# Patient Record
Sex: Female | Born: 1980 | Race: White | Hispanic: No | Marital: Married | Smoking: Current every day smoker
Health system: Southern US, Community
[De-identification: ages and names within clinical notes are randomized; demographics above are authoritative.]

## PROBLEM LIST (undated history)

## (undated) DIAGNOSIS — F329 Major depressive disorder, single episode, unspecified: Secondary | ICD-10-CM

## (undated) DIAGNOSIS — C801 Malignant (primary) neoplasm, unspecified: Secondary | ICD-10-CM

## (undated) DIAGNOSIS — F32A Depression, unspecified: Secondary | ICD-10-CM

## (undated) DIAGNOSIS — G709 Myoneural disorder, unspecified: Secondary | ICD-10-CM

## (undated) DIAGNOSIS — Z720 Tobacco use: Secondary | ICD-10-CM

## (undated) DIAGNOSIS — F320 Major depressive disorder, single episode, mild: Secondary | ICD-10-CM

## (undated) HISTORY — DX: Depression, unspecified: F32.A

## (undated) HISTORY — DX: Tobacco use: Z72.0

## (undated) HISTORY — DX: Major depressive disorder, single episode, unspecified: F32.9

## (undated) HISTORY — DX: Major depressive disorder, single episode, mild: F32.0

## (undated) MED FILL — Fluorouracil IV Soln 2.5 GM/50ML (50 MG/ML): INTRAVENOUS | Qty: 15 | Status: AC

## (undated) MED FILL — Fluorouracil IV Soln 5 GM/100ML (50 MG/ML): INTRAVENOUS | Qty: 88 | Status: AC

---

## 2012-08-02 LAB — HM PAP SMEAR: HM PAP: NORMAL

## 2016-01-07 LAB — HM PAP SMEAR: HM PAP: NORMAL

## 2016-03-24 DIAGNOSIS — Z3042 Encounter for surveillance of injectable contraceptive: Secondary | ICD-10-CM | POA: Diagnosis not present

## 2016-06-09 DIAGNOSIS — Z3042 Encounter for surveillance of injectable contraceptive: Secondary | ICD-10-CM | POA: Diagnosis not present

## 2016-08-25 DIAGNOSIS — Z3042 Encounter for surveillance of injectable contraceptive: Secondary | ICD-10-CM | POA: Diagnosis not present

## 2016-11-17 DIAGNOSIS — Z3042 Encounter for surveillance of injectable contraceptive: Secondary | ICD-10-CM | POA: Diagnosis not present

## 2017-02-05 ENCOUNTER — Telehealth: Payer: Self-pay | Admitting: Physician Assistant

## 2017-02-05 NOTE — Telephone Encounter (Signed)
Pt notified we need record last Depo inj Pt will bring documentation when she comes in

## 2017-02-08 DIAGNOSIS — Z3042 Encounter for surveillance of injectable contraceptive: Secondary | ICD-10-CM | POA: Diagnosis not present

## 2017-02-22 ENCOUNTER — Ambulatory Visit (INDEPENDENT_AMBULATORY_CARE_PROVIDER_SITE_OTHER): Payer: BLUE CROSS/BLUE SHIELD | Admitting: Physician Assistant

## 2017-02-22 ENCOUNTER — Encounter: Payer: Self-pay | Admitting: Physician Assistant

## 2017-02-22 VITALS — BP 126/78 | HR 85 | Temp 98.5°F | Ht 68.0 in | Wt 190.4 lb

## 2017-02-22 DIAGNOSIS — Z3042 Encounter for surveillance of injectable contraceptive: Secondary | ICD-10-CM | POA: Diagnosis not present

## 2017-02-22 MED ORDER — MEDROXYPROGESTERONE ACETATE 150 MG/ML IM SUSP: 150.0000 mg | INTRAMUSCULAR | 4 refills | Status: DC

## 2017-02-22 NOTE — Patient Instructions (Signed)

## 2017-02-22 NOTE — Progress Notes (Signed)
BP 126/78   Pulse 85   Temp 98.5 F (36.9 C) (Oral)   Ht 5\' 8"  (1.727 m)   Wt 190 lb 6.4 oz (86.4 kg)   BMI 28.95 kg/m    Subjective:    Patient ID: Terri Mckinney, female    DOB: 07-07-1981, 36 y.o.   MRN: [id number]  Terri Mckinney is a 36 y.o. female presenting on 02/22/2017 for New Patient (Initial Visit) and Establish Care (Needs prescription for depo provera)   HPI this is a new patient to our office. She comes in today to discuss continuation of her Depo-Provera injection. She has done quite well with this for many years. Her gynecologist at [facility] had her taking it anywhere from 11-13 weeks out. She has minimal amount of spotting near the end of a cycle. She will be coming 57 well female exam and Pap collection in coming weeks. All of her past medical history is reviewed today and very normal. She does not know of any family history of osteoporosis. We have had a brief discussion concerning long-term use of Depo-Provera injection and increased risk for osteoporosis. She would like to stay with the medication at this time. It is of note that she is also a smoker. So this is one of the safer Wednesdays at that time.  History reviewed. No pertinent past medical history. Relevant past medical, surgical, family and social history reviewed and updated as indicated. Interim medical history since our last visit reviewed. Allergies and medications reviewed and updated.   Data reviewed from any sources in EPIC.  Review of Systems  Constitutional: Negative.  Negative for activity change, fatigue and fever.  HENT: Negative.   Eyes: Negative.   Respiratory: Negative.  Negative for cough.   Cardiovascular: Negative.  Negative for chest pain.  Gastrointestinal: Negative.  Negative for abdominal pain.  Endocrine: Negative.   Genitourinary: Negative.  Negative for dysuria.  Musculoskeletal: Negative.   Skin: Negative.   Neurological: Negative.      Social History   Social  History  . Marital status: Married    Spouse name: N/A  . Number of children: N/A  . Years of education: N/A   Occupational History  . Not on file.   Social History Main Topics  . Smoking status: Current Every Day Smoker    Packs/day: 1.00    Types: Cigarettes  . Smokeless tobacco: Never Used  . Alcohol use No  . Drug use: No  . Sexual activity: Yes    Birth control/ protection: Injection   Other Topics Concern  . Not on file   Social History Narrative  . No narrative on file    History reviewed. No pertinent surgical history.  Family History  Problem Relation Age of Onset  . Heart disease Mother   . Diabetes Mother   . Hearing loss Father   . Heart disease Father   . Hyperlipidemia Father   . Hypertension Father   . Stroke Father   . Arthritis Father   . Diabetes Father   . Learning disabilities Brother   . Diabetes Maternal Grandmother   . Heart disease Maternal Grandmother   . Diabetes Maternal Grandfather   . Diabetes Paternal Grandmother   . Diabetes Paternal Grandfather     Allergies as of 02/22/2017   No Known Allergies     Medication List       Accurate as of 02/22/17 12:02 PM. Always use your most recent med list.  medroxyPROGESTERone 150 MG/ML injection Commonly known as:  DEPO-PROVERA Inject 1 mL (150 mg total) into the muscle every 3 (three) months.   MULTI-VITAMINS Tabs Take by mouth.          Objective:    BP 126/78   Pulse 85   Temp 98.5 F (36.9 C) (Oral)   Ht 5\' 8"  (1.727 m)   Wt 190 lb 6.4 oz (86.4 kg)   BMI 28.95 kg/m   No Known Allergies Wt Readings from Last 3 Encounters:  02/22/17 190 lb 6.4 oz (86.4 kg)    Physical Exam  Constitutional: She is oriented to person, place, and time. She appears well-developed and well-nourished.  HENT:  Head: Normocephalic and atraumatic.  Eyes: Conjunctivae and EOM are normal. Pupils are equal, round, and reactive to light.  Cardiovascular: Normal rate, regular rhythm,  normal heart sounds and intact distal pulses.   Pulmonary/Chest: Effort normal and breath sounds normal.  Abdominal: Soft. Bowel sounds are normal.  Neurological: She is alert and oriented to person, place, and time. She has normal reflexes.  Skin: Skin is warm and dry. No rash noted.  Psychiatric: She has a normal mood and affect. Her behavior is normal. Judgment and thought content normal.        Assessment & Plan:   1. Encounter for surveillance of injectable contraceptive - medroxyPROGESTERone (DEPO-PROVERA) 150 MG/ML injection; Inject 1 mL (150 mg total) into the muscle every 3 (three) months.  Dispense: 1 mL; Refill: 4   Continue all other maintenance medications as listed above. Educational handout given for birth control options  Follow up plan: Return for COMplete physical soon.  Terald Sleeper PA-C [place] [address]  [place], [place] [address] [phone]   02/22/2017, 12:02 PM

## 2017-05-02 ENCOUNTER — Ambulatory Visit (INDEPENDENT_AMBULATORY_CARE_PROVIDER_SITE_OTHER): Payer: BLUE CROSS/BLUE SHIELD | Admitting: *Deleted

## 2017-05-02 DIAGNOSIS — Z3042 Encounter for surveillance of injectable contraceptive: Secondary | ICD-10-CM | POA: Diagnosis not present

## 2017-05-02 MED ORDER — MEDROXYPROGESTERONE ACETATE 150 MG/ML IM SUSY
150.0000 mg | PREFILLED_SYRINGE | INTRAMUSCULAR | Status: AC
  Administered 2017-05-02 – 2018-01-14 (×4): 150 mg via INTRAMUSCULAR

## 2017-05-02 NOTE — Progress Notes (Signed)
Pt given Medroxyprogesterone inj Tolerated well 

## 2017-07-25 ENCOUNTER — Ambulatory Visit (INDEPENDENT_AMBULATORY_CARE_PROVIDER_SITE_OTHER): Payer: BLUE CROSS/BLUE SHIELD

## 2017-07-25 DIAGNOSIS — Z3042 Encounter for surveillance of injectable contraceptive: Secondary | ICD-10-CM | POA: Diagnosis not present

## 2017-10-17 ENCOUNTER — Ambulatory Visit (INDEPENDENT_AMBULATORY_CARE_PROVIDER_SITE_OTHER): Payer: BLUE CROSS/BLUE SHIELD | Admitting: *Deleted

## 2017-10-17 DIAGNOSIS — Z3042 Encounter for surveillance of injectable contraceptive: Secondary | ICD-10-CM

## 2017-10-17 NOTE — Patient Instructions (Signed)

## 2018-01-14 ENCOUNTER — Ambulatory Visit (INDEPENDENT_AMBULATORY_CARE_PROVIDER_SITE_OTHER): Payer: BLUE CROSS/BLUE SHIELD | Admitting: *Deleted

## 2018-01-14 DIAGNOSIS — Z3042 Encounter for surveillance of injectable contraceptive: Secondary | ICD-10-CM | POA: Diagnosis not present

## 2018-01-14 NOTE — Progress Notes (Signed)
Pt given Medroxyprogesterone inj Tolerated well 

## 2018-04-02 ENCOUNTER — Ambulatory Visit: Payer: BLUE CROSS/BLUE SHIELD

## 2018-04-03 ENCOUNTER — Ambulatory Visit (INDEPENDENT_AMBULATORY_CARE_PROVIDER_SITE_OTHER): Payer: BLUE CROSS/BLUE SHIELD | Admitting: *Deleted

## 2018-04-03 ENCOUNTER — Other Ambulatory Visit: Payer: Self-pay | Admitting: Physician Assistant

## 2018-04-03 DIAGNOSIS — Z3042 Encounter for surveillance of injectable contraceptive: Secondary | ICD-10-CM

## 2018-04-03 MED ORDER — MEDROXYPROGESTERONE ACETATE 150 MG/ML IM SUSP
150.0000 mg | Freq: Once | INTRAMUSCULAR | Status: AC
  Administered 2018-04-03: 150 mg via INTRAMUSCULAR

## 2018-04-03 NOTE — Progress Notes (Signed)
Pt given Medroxyprogesterone inj Tolerated well 

## 2018-04-03 NOTE — Telephone Encounter (Signed)
Rx sent to pharmacy   

## 2018-04-10 ENCOUNTER — Encounter: Payer: Self-pay | Admitting: Physician Assistant

## 2018-04-10 ENCOUNTER — Ambulatory Visit (INDEPENDENT_AMBULATORY_CARE_PROVIDER_SITE_OTHER): Payer: BLUE CROSS/BLUE SHIELD | Admitting: Physician Assistant

## 2018-04-10 VITALS — BP 116/71 | HR 89 | Temp 97.9°F | Ht 68.0 in | Wt 194.6 lb

## 2018-04-10 DIAGNOSIS — Z0000 Encounter for general adult medical examination without abnormal findings: Secondary | ICD-10-CM | POA: Diagnosis not present

## 2018-04-10 DIAGNOSIS — Z136 Encounter for screening for cardiovascular disorders: Secondary | ICD-10-CM | POA: Diagnosis not present

## 2018-04-10 DIAGNOSIS — Z01419 Encounter for gynecological examination (general) (routine) without abnormal findings: Secondary | ICD-10-CM | POA: Diagnosis not present

## 2018-04-10 DIAGNOSIS — Z3042 Encounter for surveillance of injectable contraceptive: Secondary | ICD-10-CM

## 2018-04-10 MED ORDER — MEDROXYPROGESTERONE ACETATE 150 MG/ML IM SUSP: 150.0000 mg | INTRAMUSCULAR | 3 refills | Status: DC

## 2018-04-10 MED ORDER — DOXYCYCLINE HYCLATE 100 MG PO TABS: 100.0000 mg | ORAL_TABLET | Freq: Two times a day (BID) | ORAL | 0 refills | Status: DC

## 2018-04-10 MED ORDER — BUPROPION HCL ER (XL) 150 MG PO TB24: 150.0000 mg | ORAL_TABLET | Freq: Every day | ORAL | 1 refills | Status: DC

## 2018-04-10 NOTE — Patient Instructions (Signed)
In a few days you may receive a survey in the mail or online from [employer] regarding your visit with us today. Please take a moment to fill this out. Your feedback is very important to our whole office. It can help us better understand your needs as well as improve your experience and satisfaction. Thank you for taking your time to complete it. We care about you.  Navdeep Halt, PA-C  

## 2018-04-11 LAB — CMP14+EGFR
ALBUMIN: 4.3 g/dL (ref 3.5–5.5)
ALT: 18 IU/L (ref 0–32)
AST: 11 IU/L (ref 0–40)
Albumin/Globulin Ratio: 2 (ref 1.2–2.2)
Alkaline Phosphatase: 60 IU/L (ref 39–117)
BUN / CREAT RATIO: 14 (ref 9–23)
BUN: 10 mg/dL (ref 6–20)
Bilirubin Total: 0.5 mg/dL (ref 0.0–1.2)
CALCIUM: 8.9 mg/dL (ref 8.7–10.2)
CHLORIDE: 104 mmol/L (ref 96–106)
CO2: 20 mmol/L (ref 20–29)
Creatinine, Ser: 0.72 mg/dL (ref 0.57–1.00)
GFR calc Af Amer: 122 mL/min/{1.73_m2} (ref >59)
GFR calc non Af Amer: 106 mL/min/{1.73_m2} (ref >59)
GLOBULIN, TOTAL: 2.1 g/dL (ref 1.5–4.5)
GLUCOSE: 73 mg/dL (ref 65–99)
POTASSIUM: 4.2 mmol/L (ref 3.5–5.2)
Sodium: 138 mmol/L (ref 134–144)
Total Protein: 6.4 g/dL (ref 6.0–8.5)

## 2018-04-11 LAB — LIPID PANEL
Chol/HDL Ratio: 5.2 ratio — ABNORMAL HIGH (ref 0.0–4.4)
Cholesterol, Total: 187 mg/dL (ref 100–199)
HDL: 36 mg/dL — AB (ref >39)
LDL Calculated: 128 mg/dL — ABNORMAL HIGH (ref 0–99)
Triglycerides: 114 mg/dL (ref 0–149)
VLDL Cholesterol Cal: 23 mg/dL (ref 5–40)

## 2018-04-11 LAB — CBC WITH DIFFERENTIAL/PLATELET
BASOS ABS: 0 10*3/uL (ref 0.0–0.2)
Basos: 0 %
EOS (ABSOLUTE): 0.4 10*3/uL (ref 0.0–0.4)
EOS: 4 %
HEMATOCRIT: 41.2 % (ref 34.0–46.6)
HEMOGLOBIN: 14 g/dL (ref 11.1–15.9)
IMMATURE GRANULOCYTES: 0 %
Immature Grans (Abs): 0 10*3/uL (ref 0.0–0.1)
Lymphocytes Absolute: 3.4 10*3/uL — ABNORMAL HIGH (ref 0.7–3.1)
Lymphs: 35 %
MCH: 31.3 pg (ref 26.6–33.0)
MCHC: 34 g/dL (ref 31.5–35.7)
MCV: 92 fL (ref 79–97)
MONOCYTES: 6 %
Monocytes Absolute: 0.5 10*3/uL (ref 0.1–0.9)
NEUTROS PCT: 55 %
Neutrophils Absolute: 5.2 10*3/uL (ref 1.4–7.0)
Platelets: 319 10*3/uL (ref 150–379)
RBC: 4.48 x10E6/uL (ref 3.77–5.28)
RDW: 12.3 % (ref 12.3–15.4)
WBC: 9.5 10*3/uL (ref 3.4–10.8)

## 2018-04-11 NOTE — Progress Notes (Signed)
BP 116/71   Pulse 89   Temp 97.9 F (36.6 C) (Oral)   Ht '5\' 8"'  (1.727 m)   Wt 194 lb 9.6 oz (88.3 kg)   BMI 29.59 kg/m    Subjective:    Patient ID: Terri Mckinney, female    DOB: 10-22-1981, 37 y.o.   MRN: [id number]  HPI: Terri Mckinney is a 37 y.o. female presenting on 04/10/2018 for Annual Exam  This patient comes in for annual well physical examination. All medications are reviewed today. There are no reports of any problems with the medications. All of the medical conditions are reviewed and updated.  Lab work is reviewed and will be ordered as medically necessary. There are no new problems reported with today's visit.  Patient reports doing well overall.  Weight gain has been more pronounce and admits to emotional eating. Would like to start Wellbutrin for this.  History reviewed. No pertinent past medical history. Relevant past medical, surgical, family and social history reviewed and updated as indicated. Interim medical history since our last visit reviewed. Allergies and medications reviewed and updated. DATA REVIEWED: CHART IN EPIC  Family History reviewed for pertinent findings.  Review of Systems  Constitutional: Negative.  Negative for activity change, fatigue and fever.  HENT: Negative.   Eyes: Negative.   Respiratory: Negative.  Negative for cough.   Cardiovascular: Negative.  Negative for chest pain.  Gastrointestinal: Negative.  Negative for abdominal pain.  Endocrine: Negative.   Genitourinary: Negative.  Negative for dysuria.  Musculoskeletal: Negative.   Skin: Negative.   Neurological: Negative.     Allergies as of 04/10/2018   No Known Allergies     Medication List        Accurate as of 04/10/18 11:59 PM. Always use your most recent med list.          buPROPion 150 MG 24 hr tablet Commonly known as:  WELLBUTRIN XL Take 1 tablet (150 mg total) by mouth daily.   doxycycline 100 MG tablet Commonly known as:  VIBRA-TABS Take 1 tablet (100 mg  total) by mouth 2 (two) times daily.   medroxyPROGESTERone 150 MG/ML injection Commonly known as:  DEPO-PROVERA Inject 1 mL (150 mg total) into the muscle every 3 (three) months.   MULTI-VITAMINS Tabs Take by mouth.          Objective:    BP 116/71   Pulse 89   Temp 97.9 F (36.6 C) (Oral)   Ht '5\' 8"'  (1.727 m)   Wt 194 lb 9.6 oz (88.3 kg)   BMI 29.59 kg/m   No Known Allergies  Wt Readings from Last 3 Encounters:  04/10/18 194 lb 9.6 oz (88.3 kg)  02/22/17 190 lb 6.4 oz (86.4 kg)    Physical Exam  Constitutional: She is oriented to person, place, and time. She appears well-developed and well-nourished.  HENT:  Head: Normocephalic and atraumatic.  Eyes: Pupils are equal, round, and reactive to light. Conjunctivae and EOM are normal.  Neck: Normal range of motion. Neck supple.  Cardiovascular: Normal rate, regular rhythm, normal heart sounds and intact distal pulses.  Pulmonary/Chest: Effort normal and breath sounds normal. Right breast exhibits no mass, no skin change and no tenderness. Left breast exhibits no mass, no skin change and no tenderness. No breast tenderness, discharge or bleeding. Breasts are symmetrical.  Abdominal: Soft. Bowel sounds are normal.  Genitourinary: Vagina normal and uterus normal. Rectal exam shows no fissure. No breast tenderness, discharge or bleeding. There is no  tenderness or lesion on the right labia. There is no tenderness or lesion on the left labia. Uterus is not deviated, not enlarged and not tender. Cervix exhibits no motion tenderness, no discharge and no friability. Right adnexum displays no mass, no tenderness and no fullness. Left adnexum displays no mass, no tenderness and no fullness. No tenderness or bleeding in the vagina. No vaginal discharge found.  Neurological: She is alert and oriented to person, place, and time. She has normal reflexes.  Skin: Skin is warm and dry. No rash noted.  Psychiatric: She has a normal mood and affect.  Her behavior is normal. Judgment and thought content normal.    Results for orders placed or performed in visit on 04/10/18  HM PAP SMEAR  Result Value Ref Range   HM Pap smear Normal-see report   HM PAP SMEAR  Result Value Ref Range   HM Pap smear normal   CBC with Differential/Platelet  Result Value Ref Range   WBC 9.5 3.4 - 10.8 x10E3/uL   RBC 4.48 3.77 - 5.28 x10E6/uL   Hemoglobin 14.0 11.1 - 15.9 g/dL   Hematocrit 41.2 34.0 - 46.6 %   MCV 92 79 - 97 fL   MCH 31.3 26.6 - 33.0 pg   MCHC 34.0 31.5 - 35.7 g/dL   RDW 12.3 12.3 - 15.4 %   Platelets 319 150 - 379 x10E3/uL   Neutrophils 55 Not Estab. %   Lymphs 35 Not Estab. %   Monocytes 6 Not Estab. %   Eos 4 Not Estab. %   Basos 0 Not Estab. %   Neutrophils Absolute 5.2 1.4 - 7.0 x10E3/uL   Lymphocytes Absolute 3.4 (H) 0.7 - 3.1 x10E3/uL   Monocytes Absolute 0.5 0.1 - 0.9 x10E3/uL   EOS (ABSOLUTE) 0.4 0.0 - 0.4 x10E3/uL   Basophils Absolute 0.0 0.0 - 0.2 x10E3/uL   Immature Granulocytes 0 Not Estab. %   Immature Grans (Abs) 0.0 0.0 - 0.1 x10E3/uL  CMP14+EGFR  Result Value Ref Range   Glucose 73 65 - 99 mg/dL   BUN 10 6 - 20 mg/dL   Creatinine, Ser 0.72 0.57 - 1.00 mg/dL   GFR calc non Af Amer 106 >59 mL/min/1.73   GFR calc Af Amer 122 >59 mL/min/1.73   BUN/Creatinine Ratio 14 9 - 23   Sodium 138 134 - 144 mmol/L   Potassium 4.2 3.5 - 5.2 mmol/L   Chloride 104 96 - 106 mmol/L   CO2 20 20 - 29 mmol/L   Calcium 8.9 8.7 - 10.2 mg/dL   Total Protein 6.4 6.0 - 8.5 g/dL   Albumin 4.3 3.5 - 5.5 g/dL   Globulin, Total 2.1 1.5 - 4.5 g/dL   Albumin/Globulin Ratio 2.0 1.2 - 2.2   Bilirubin Total 0.5 0.0 - 1.2 mg/dL   Alkaline Phosphatase 60 39 - 117 IU/L   AST 11 0 - 40 IU/L   ALT 18 0 - 32 IU/L  Lipid panel  Result Value Ref Range   Cholesterol, Total 187 100 - 199 mg/dL   Triglycerides 114 0 - 149 mg/dL   HDL 36 (L) >39 mg/dL   VLDL Cholesterol Cal 23 5 - 40 mg/dL   LDL Calculated 128 (H) 0 - 99 mg/dL   Chol/HDL  Ratio 5.2 (H) 0.0 - 4.4 ratio      Assessment & Plan:   1. Well female exam with routine gynecological exam - HM PAP SMEAR - HM PAP SMEAR - CBC with Differential/Platelet - CMP14+EGFR -  Lipid panel  2. Encounter for surveillance of injectable contraceptive - medroxyPROGESTERone (DEPO-PROVERA) 150 MG/ML injection; Inject 1 mL (150 mg total) into the muscle every 3 (three) months.  Dispense: 1 mL; Refill: 3   Continue all other maintenance medications as listed above.  Follow up plan: Return in about 2 months (around 06/10/2018) for recheck.  Educational handout given for [place] PA-C [place] [address]  [place], [place] [address] [phone]   04/11/2018, 9:39 PM

## 2018-04-12 ENCOUNTER — Encounter: Payer: Self-pay | Admitting: *Deleted

## 2018-06-10 ENCOUNTER — Ambulatory Visit: Payer: BLUE CROSS/BLUE SHIELD | Admitting: Physician Assistant

## 2018-06-10 ENCOUNTER — Encounter: Payer: Self-pay | Admitting: Physician Assistant

## 2018-06-10 VITALS — BP 123/73 | HR 88 | Temp 98.2°F | Ht 68.0 in | Wt 195.0 lb

## 2018-06-10 DIAGNOSIS — J0110 Acute frontal sinusitis, unspecified: Secondary | ICD-10-CM

## 2018-06-10 DIAGNOSIS — F320 Major depressive disorder, single episode, mild: Secondary | ICD-10-CM | POA: Insufficient documentation

## 2018-06-10 MED ORDER — ESCITALOPRAM OXALATE 10 MG PO TABS: 10.0000 mg | ORAL_TABLET | Freq: Every day | ORAL | 1 refills | Status: DC

## 2018-06-10 MED ORDER — AMOXICILLIN 500 MG PO CAPS: 500.0000 mg | ORAL_CAPSULE | Freq: Three times a day (TID) | ORAL | 0 refills | Status: DC

## 2018-06-10 NOTE — Patient Instructions (Signed)
In a few days you may receive a survey in the mail or online from [employer] regarding your visit with us today. Please take a moment to fill this out. Your feedback is very important to our whole office. It can help us better understand your needs as well as improve your experience and satisfaction. Thank you for taking your time to complete it. We care about you.  Meng [place], PA-C  

## 2018-06-10 NOTE — Progress Notes (Signed)
BP 123/73   Pulse 88   Temp 98.2 F (36.8 C) (Oral)   Ht 5\' 8"  (1.727 m)   Wt 195 lb (88.5 kg)   BMI 29.65 kg/m    Subjective:    Patient ID: Terri Mckinney, female    DOB: 13-Jan-1981, 37 y.o.   MRN: [id number]  HPI: Terri Mckinney is a 37 y.o. female presenting on 06/10/2018 for Anxiety (follow up on Wellbutrin ) and Sinusitis  This patient has had many days of sinus headache and postnasal drainage. There is copious drainage at times. Denies any fever at this time. There has been a history of sinus infections in the past.  No history of sinus surgery. There is cough at night. It has become more prevalent in recent days.  Patient also comes back for recheck on her Wellbutrin.  She was able to take it about 6 days and did not sleep well at all.  Her up very late.  She felt nervous and jittery on it.  After she stopped the medicine it went away.  She notes that when she takes Benadryl she does have the opposite effect and is Awake with the medication.  She does not know of any other medications that give her bad side effects.  History reviewed. No pertinent past medical history. Relevant past medical, surgical, family and social history reviewed and updated as indicated. Interim medical history since our last visit reviewed. Allergies and medications reviewed and updated. DATA REVIEWED: CHART IN EPIC  Family History reviewed for pertinent findings.  Review of Systems  Constitutional: Positive for chills and fatigue. Negative for activity change, appetite change and fever.  HENT: Positive for congestion, postnasal drip, sinus pressure, sinus pain and sore throat.   Eyes: Negative.   Respiratory: Positive for cough. Negative for wheezing.   Cardiovascular: Negative.  Negative for chest pain, palpitations and leg swelling.  Gastrointestinal: Negative.   Genitourinary: Negative.   Musculoskeletal: Negative.   Skin: Negative.   Neurological: Positive for headaches.    Allergies as  of 06/10/2018      Reactions   Wellbutrin [bupropion]    insomnia      Medication List        Accurate as of 06/10/18  3:15 PM. Always use your most recent med list.          amoxicillin 500 MG capsule Commonly known as:  AMOXIL Take 1 capsule (500 mg total) by mouth 3 (three) times daily.   escitalopram 10 MG tablet Commonly known as:  LEXAPRO Take 1 tablet (10 mg total) by mouth daily.   medroxyPROGESTERone 150 MG/ML injection Commonly known as:  DEPO-PROVERA Inject 1 mL (150 mg total) into the muscle every 3 (three) months.   MULTI-VITAMINS Tabs Take by mouth.          Objective:    BP 123/73   Pulse 88   Temp 98.2 F (36.8 C) (Oral)   Ht 5\' 8"  (1.727 m)   Wt 195 lb (88.5 kg)   BMI 29.65 kg/m   Allergies  Allergen Reactions  . Wellbutrin [Bupropion]     insomnia    Wt Readings from Last 3 Encounters:  06/10/18 195 lb (88.5 kg)  04/10/18 194 lb 9.6 oz (88.3 kg)  02/22/17 190 lb 6.4 oz (86.4 kg)    Physical Exam  Constitutional: She is oriented to person, place, and time. She appears well-developed and well-nourished.  HENT:  Head: Normocephalic and atraumatic.  Right Ear: Tympanic membrane and  external ear normal. No middle ear effusion.  Left Ear: Tympanic membrane and external ear normal.  No middle ear effusion.  Nose: Mucosal edema and rhinorrhea present. Right sinus exhibits no maxillary sinus tenderness. Left sinus exhibits no maxillary sinus tenderness.  Mouth/Throat: Uvula is midline. Posterior oropharyngeal erythema present.  Eyes: Pupils are equal, round, and reactive to light. Conjunctivae and EOM are normal. Right eye exhibits no discharge. Left eye exhibits no discharge.  Neck: Normal range of motion.  Cardiovascular: Normal rate, regular rhythm and normal heart sounds.  Pulmonary/Chest: Effort normal and breath sounds normal. No respiratory distress. She has no wheezes.  Abdominal: Soft.  Lymphadenopathy:    She has no cervical  adenopathy.  Neurological: She is alert and oriented to person, place, and time.  Skin: Skin is warm and dry.  Psychiatric: She has a normal mood and affect.        Assessment & Plan:   1. Acute non-recurrent frontal sinusitis - amoxicillin (AMOXIL) 500 MG capsule; Take 1 capsule (500 mg total) by mouth 3 (three) times daily.  Dispense: 30 capsule; Refill: 0  2. Depression, major, single episode, mild (HCC) - escitalopram (LEXAPRO) 10 MG tablet; Take 1 tablet (10 mg total) by mouth daily.  Dispense: 30 tablet; Refill: 1   Continue all other maintenance medications as listed above.  Follow up plan: Return in about 1 month (around 07/08/2018) for recheck.  Educational handout given for [place] PA-C [place] [address]  [place], [place] [address] [phone]   06/10/2018, 3:15 PM

## 2018-06-20 ENCOUNTER — Ambulatory Visit (INDEPENDENT_AMBULATORY_CARE_PROVIDER_SITE_OTHER): Payer: BLUE CROSS/BLUE SHIELD | Admitting: *Deleted

## 2018-06-20 DIAGNOSIS — Z3042 Encounter for surveillance of injectable contraceptive: Secondary | ICD-10-CM | POA: Diagnosis not present

## 2018-06-20 MED ORDER — MEDROXYPROGESTERONE ACETATE 150 MG/ML IM SUSP
150.0000 mg | INTRAMUSCULAR | Status: DC
  Administered 2018-06-20 – 2018-09-05 (×2): 150 mg via INTRAMUSCULAR

## 2018-06-20 NOTE — Progress Notes (Signed)
Pt given Medroxyprogesterone inj Tolerated well 

## 2018-07-10 ENCOUNTER — Encounter: Payer: Self-pay | Admitting: Physician Assistant

## 2018-07-10 ENCOUNTER — Ambulatory Visit: Payer: BLUE CROSS/BLUE SHIELD | Admitting: Physician Assistant

## 2018-07-10 VITALS — BP 109/70 | HR 69 | Temp 98.6°F | Ht 68.0 in | Wt 192.2 lb

## 2018-07-10 DIAGNOSIS — F320 Major depressive disorder, single episode, mild: Secondary | ICD-10-CM | POA: Diagnosis not present

## 2018-07-10 DIAGNOSIS — H60502 Unspecified acute noninfective otitis externa, left ear: Secondary | ICD-10-CM

## 2018-07-10 MED ORDER — TRAZODONE HCL 50 MG PO TABS: 50.0000 mg | ORAL_TABLET | Freq: Every day | ORAL | 2 refills | Status: DC

## 2018-07-10 MED ORDER — NEOMYCIN-POLYMYXIN-HC 3.5-10000-1 OT SOLN: 3.0000 [drp] | Freq: Four times a day (QID) | OTIC | 0 refills | Status: DC

## 2018-07-10 MED ORDER — ESCITALOPRAM OXALATE 10 MG PO TABS: 10.0000 mg | ORAL_TABLET | Freq: Every day | ORAL | 6 refills | Status: DC

## 2018-07-10 NOTE — Progress Notes (Signed)
BP 109/70   Pulse 69   Temp 98.6 F (37 C) (Oral)   Ht 5\' 8"  (1.727 m)   Wt 192 lb 3.2 oz (87.2 kg)   BMI 29.22 kg/m    Subjective:    Patient ID: Terri Mckinney, female    DOB: 07-30-1981, 37 y.o.   MRN: [id number]  HPI: Terri Mckinney is a 37 y.o. female presenting on 07/10/2018 for Depression (1  month follow up ) She reports that her depression is good and stable on the Lexapro.  She would like to continue it and have refills.  However she still does complain of significant difficulty with sleep.  She reports that she is always had this problem.  She states that she is not having any bad episodes of anxiety.  She still has lots of stress and family issues.  In addition she is having left ear canal pain.  She does have to wear earplugs at work.  I encouraged her to try to use the earmuffs that did not have to go into the ear canal.  She denies any fever or chills.   History reviewed. No pertinent past medical history. Relevant past medical, surgical, family and social history reviewed and updated as indicated. Interim medical history since our last visit reviewed. Allergies and medications reviewed and updated. DATA REVIEWED: CHART IN EPIC  Family History reviewed for pertinent findings.  Review of Systems  Constitutional: Negative.  Negative for fever.  HENT: Positive for ear pain. Negative for ear discharge.   Eyes: Negative.   Respiratory: Negative.   Gastrointestinal: Negative.   Genitourinary: Negative.   Psychiatric/Behavioral: Negative.     Allergies as of 07/10/2018      Reactions   Wellbutrin [bupropion]    insomnia      Medication List        Accurate as of 07/10/18 11:59 PM. Always use your most recent med list.          escitalopram 10 MG tablet Commonly known as:  LEXAPRO Take 1 tablet (10 mg total) by mouth daily.   medroxyPROGESTERone 150 MG/ML injection Commonly known as:  DEPO-PROVERA Inject 1 mL (150 mg total) into the muscle every 3 (three)  months.   MULTI-VITAMINS Tabs Take by mouth.   neomycin-polymyxin-hydrocortisone OTIC solution Commonly known as:  CORTISPORIN Place 3 drops into the left ear 4 (four) times daily.   traZODone 50 MG tablet Commonly known as:  DESYREL Take 1-2 tablets (50-100 mg total) by mouth at bedtime.          Objective:    BP 109/70   Pulse 69   Temp 98.6 F (37 C) (Oral)   Ht 5\' 8"  (1.727 m)   Wt 192 lb 3.2 oz (87.2 kg)   BMI 29.22 kg/m   Allergies  Allergen Reactions  . Wellbutrin [Bupropion]     insomnia    Wt Readings from Last 3 Encounters:  07/10/18 192 lb 3.2 oz (87.2 kg)  06/10/18 195 lb (88.5 kg)  04/10/18 194 lb 9.6 oz (88.3 kg)    Physical Exam  Constitutional: She is oriented to person, place, and time. She appears well-developed and well-nourished.  HENT:  Head: Normocephalic and atraumatic.  Right Ear: Hearing, external ear and ear canal normal.  Left Ear: There is drainage and tenderness.  Eyes: Pupils are equal, round, and reactive to light. Conjunctivae and EOM are normal.  Cardiovascular: Normal rate, regular rhythm, normal heart sounds and intact distal pulses.  Pulmonary/Chest:  Effort normal and breath sounds normal.  Abdominal: Soft. Bowel sounds are normal.  Neurological: She is alert and oriented to person, place, and time. She has normal reflexes.  Skin: Skin is warm and dry. No rash noted.  Psychiatric: She has a normal mood and affect. Her behavior is normal. Judgment and thought content normal.        Assessment & Plan:   1. Depression, major, single episode, mild (HCC) - escitalopram (LEXAPRO) 10 MG tablet; Take 1 tablet (10 mg total) by mouth daily.  Dispense: 30 tablet; Refill: 6  2. Acute otitis externa of left ear, unspecified type - neomycin-polymyxin-hydrocortisone (CORTISPORIN) OTIC solution; Place 3 drops into the left ear 4 (four) times daily.  Dispense: 10 mL; Refill: 0   Continue all other maintenance medications as listed  above.  Follow up plan: Return in about 1 month (around 08/07/2018) for recheck.  Educational handout given for [place] PA-C [place] [address]  [place], [place] [address] [phone]   07/11/2018, 10:23 PM

## 2018-07-22 ENCOUNTER — Telehealth: Payer: Self-pay | Admitting: Physician Assistant

## 2018-07-22 ENCOUNTER — Other Ambulatory Visit: Payer: Self-pay | Admitting: Physician Assistant

## 2018-07-22 MED ORDER — CEPHALEXIN 500 MG PO CAPS: 500.0000 mg | ORAL_CAPSULE | Freq: Four times a day (QID) | ORAL | 0 refills | Status: DC

## 2018-07-22 NOTE — Telephone Encounter (Signed)
I have sent a prescription of Keflex to her pharmacy.  This is an oral antibiotic.

## 2018-07-22 NOTE — Telephone Encounter (Signed)
[id number]

## 2018-08-06 NOTE — Telephone Encounter (Signed)
Patient was prescribed Keflex on 07/22/18

## 2018-08-12 ENCOUNTER — Ambulatory Visit: Payer: BLUE CROSS/BLUE SHIELD | Admitting: Physician Assistant

## 2018-08-12 ENCOUNTER — Encounter: Payer: Self-pay | Admitting: Physician Assistant

## 2018-08-12 VITALS — BP 116/70 | HR 99 | Temp 98.4°F | Ht 68.0 in | Wt 194.6 lb

## 2018-08-12 DIAGNOSIS — F5101 Primary insomnia: Secondary | ICD-10-CM

## 2018-08-12 NOTE — Progress Notes (Signed)
BP 116/70   Pulse 99   Temp 98.4 F (36.9 C) (Oral)   Ht 5\' 8"  (1.727 m)   Wt 194 lb 9.6 oz (88.3 kg)   BMI 29.59 kg/m    Subjective:    Patient ID: Terri Mckinney, female    DOB: 1981/07/21, 37 y.o.   MRN: [id number]  HPI: Terri Mckinney is a 37 y.o. female presenting on 08/12/2018 for Depression (1 month follow up )  Patient comes in to discuss her depression.  She was unable to tolerate the trazodone.  It made her have very bad dreams.  She tried it at multiple doses and was never successful.  She states that also the Lexapro had made her gain weight.  She had a great increase in her food.  She would really like to be working on her diet and exercise more.  She still is having a lot of insomnia.  Past Medical History:  Diagnosis Date  . Depression    Relevant past medical, surgical, family and social history reviewed and updated as indicated. Interim medical history since our last visit reviewed. Allergies and medications reviewed and updated. DATA REVIEWED: CHART IN EPIC  Family History reviewed for pertinent findings.  Review of Systems  Constitutional: Negative.   HENT: Negative.   Eyes: Negative.   Respiratory: Negative.   Gastrointestinal: Negative.   Genitourinary: Negative.     Allergies as of 08/12/2018      Reactions   Wellbutrin [bupropion]    insomnia      Medication List        Accurate as of 08/12/18  9:38 PM. Always use your most recent med list.          medroxyPROGESTERone 150 MG/ML injection Commonly known as:  DEPO-PROVERA Inject 1 mL (150 mg total) into the muscle every 3 (three) months.   MULTI-VITAMINS Tabs Take by mouth.          Objective:    BP 116/70   Pulse 99   Temp 98.4 F (36.9 C) (Oral)   Ht 5\' 8"  (1.727 m)   Wt 194 lb 9.6 oz (88.3 kg)   BMI 29.59 kg/m   Allergies  Allergen Reactions  . Wellbutrin [Bupropion]     insomnia    Wt Readings from Last 3 Encounters:  08/12/18 194 lb 9.6 oz (88.3 kg)  07/10/18  192 lb 3.2 oz (87.2 kg)  06/10/18 195 lb (88.5 kg)    Physical Exam  Constitutional: She is oriented to person, place, and time. She appears well-developed and well-nourished.  HENT:  Head: Normocephalic and atraumatic.  Eyes: Pupils are equal, round, and reactive to light. Conjunctivae and EOM are normal.  Cardiovascular: Normal rate, regular rhythm, normal heart sounds and intact distal pulses.  Pulmonary/Chest: Effort normal and breath sounds normal.  Abdominal: Soft. Bowel sounds are normal.  Neurological: She is alert and oriented to person, place, and time. She has normal reflexes.  Skin: Skin is warm and dry. No rash noted.  Psychiatric: She has a normal mood and affect. Her behavior is normal. Judgment and thought content normal.        Assessment & Plan:   1. Primary insomnia Melatonin 10 mg at bed Counseling through [employer] if employer has option   Continue all other maintenance medications as listed above.  Follow up plan: Return if symptoms worsen or fail to improve.  Educational handout given for [place] PA-C [place]  [place], [place] [id number] [phone]   08/12/2018, 9:38 PM

## 2018-09-05 ENCOUNTER — Ambulatory Visit (INDEPENDENT_AMBULATORY_CARE_PROVIDER_SITE_OTHER): Payer: BLUE CROSS/BLUE SHIELD | Admitting: *Deleted

## 2018-09-05 DIAGNOSIS — Z3042 Encounter for surveillance of injectable contraceptive: Secondary | ICD-10-CM | POA: Diagnosis not present

## 2018-09-05 NOTE — Progress Notes (Signed)
DepoProvera given and patient tolerated well.  

## 2018-11-19 ENCOUNTER — Other Ambulatory Visit: Payer: Self-pay | Admitting: Physician Assistant

## 2018-11-19 DIAGNOSIS — Z3042 Encounter for surveillance of injectable contraceptive: Secondary | ICD-10-CM

## 2018-11-21 ENCOUNTER — Ambulatory Visit: Payer: BLUE CROSS/BLUE SHIELD

## 2018-11-22 ENCOUNTER — Ambulatory Visit (INDEPENDENT_AMBULATORY_CARE_PROVIDER_SITE_OTHER): Payer: BLUE CROSS/BLUE SHIELD | Admitting: *Deleted

## 2018-11-22 DIAGNOSIS — Z3042 Encounter for surveillance of injectable contraceptive: Secondary | ICD-10-CM | POA: Diagnosis not present

## 2018-11-22 MED ORDER — MEDROXYPROGESTERONE ACETATE 150 MG/ML IM SUSP
150.0000 mg | Freq: Once | INTRAMUSCULAR | Status: AC
  Administered 2018-11-22: 150 mg via INTRAMUSCULAR

## 2018-11-22 NOTE — Progress Notes (Signed)
Pt given depo provera IM left deltoid and tolerated well.

## 2019-01-07 ENCOUNTER — Telehealth: Payer: Self-pay | Admitting: Physician Assistant

## 2019-02-12 ENCOUNTER — Other Ambulatory Visit: Payer: Self-pay

## 2019-02-12 ENCOUNTER — Ambulatory Visit (INDEPENDENT_AMBULATORY_CARE_PROVIDER_SITE_OTHER): Payer: BLUE CROSS/BLUE SHIELD | Admitting: *Deleted

## 2019-02-12 DIAGNOSIS — Z3042 Encounter for surveillance of injectable contraceptive: Secondary | ICD-10-CM

## 2019-02-12 MED ORDER — MEDROXYPROGESTERONE ACETATE 150 MG/ML IM SUSY
150.0000 mg | PREFILLED_SYRINGE | INTRAMUSCULAR | Status: AC
  Administered 2019-02-12 – 2019-10-23 (×4): 150 mg via INTRAMUSCULAR

## 2019-02-12 NOTE — Progress Notes (Signed)
Pt given Medroxyprogesterone inj Tolerated well 

## 2019-05-07 ENCOUNTER — Other Ambulatory Visit: Payer: Self-pay

## 2019-05-07 ENCOUNTER — Ambulatory Visit (INDEPENDENT_AMBULATORY_CARE_PROVIDER_SITE_OTHER): Payer: BLUE CROSS/BLUE SHIELD | Admitting: *Deleted

## 2019-05-07 DIAGNOSIS — Z3042 Encounter for surveillance of injectable contraceptive: Secondary | ICD-10-CM | POA: Diagnosis not present

## 2019-06-05 ENCOUNTER — Other Ambulatory Visit: Payer: Self-pay | Admitting: Physician Assistant

## 2019-06-05 DIAGNOSIS — Z3042 Encounter for surveillance of injectable contraceptive: Secondary | ICD-10-CM

## 2019-07-25 ENCOUNTER — Other Ambulatory Visit: Payer: Self-pay | Admitting: Physician Assistant

## 2019-07-25 DIAGNOSIS — Z3042 Encounter for surveillance of injectable contraceptive: Secondary | ICD-10-CM

## 2019-07-25 NOTE — Telephone Encounter (Signed)
Please advise 

## 2019-07-30 ENCOUNTER — Other Ambulatory Visit: Payer: Self-pay

## 2019-07-31 ENCOUNTER — Ambulatory Visit (INDEPENDENT_AMBULATORY_CARE_PROVIDER_SITE_OTHER): Payer: BC Managed Care – PPO | Admitting: *Deleted

## 2019-07-31 DIAGNOSIS — Z3042 Encounter for surveillance of injectable contraceptive: Secondary | ICD-10-CM | POA: Diagnosis not present

## 2019-07-31 NOTE — Progress Notes (Signed)
Pt given Medroxyprogesterone inj Tolerated well 

## 2019-08-29 ENCOUNTER — Other Ambulatory Visit: Payer: Self-pay

## 2019-08-29 ENCOUNTER — Encounter: Payer: BC Managed Care – PPO | Admitting: Physician Assistant

## 2019-09-19 ENCOUNTER — Encounter: Payer: BC Managed Care – PPO | Admitting: Physician Assistant

## 2019-10-06 ENCOUNTER — Other Ambulatory Visit: Payer: Self-pay

## 2019-10-07 ENCOUNTER — Encounter: Payer: Self-pay | Admitting: Physician Assistant

## 2019-10-07 ENCOUNTER — Ambulatory Visit (INDEPENDENT_AMBULATORY_CARE_PROVIDER_SITE_OTHER): Payer: BC Managed Care – PPO | Admitting: Physician Assistant

## 2019-10-07 VITALS — BP 109/67 | HR 83 | Temp 98.9°F | Ht 68.0 in | Wt 206.0 lb

## 2019-10-07 DIAGNOSIS — Z01419 Encounter for gynecological examination (general) (routine) without abnormal findings: Secondary | ICD-10-CM

## 2019-10-07 MED ORDER — CEPHALEXIN 250 MG PO CAPS: 250.0000 mg | ORAL_CAPSULE | Freq: Three times a day (TID) | ORAL | 0 refills | Status: DC

## 2019-10-07 NOTE — Patient Instructions (Signed)
Health Maintenance, Female Adopting a healthy lifestyle and getting preventive care are important in promoting health and wellness. Ask your health care provider about:  The right schedule for you to have regular tests and exams.  Things you can do on your own to prevent diseases and keep yourself healthy. What should I know about diet, weight, and exercise? Eat a healthy diet   Eat a diet that includes plenty of vegetables, fruits, low-fat dairy products, and lean protein.  Do not eat a lot of foods that are high in solid fats, added sugars, or sodium. Maintain a healthy weight Body mass index (BMI) is used to identify weight problems. It estimates body fat based on height and weight. Your health care provider can help determine your BMI and help you achieve or maintain a healthy weight. Get regular exercise Get regular exercise. This is one of the most important things you can do for your health. Most adults should:  Exercise for at least 150 minutes each week. The exercise should increase your heart rate and make you sweat (moderate-intensity exercise).  Do strengthening exercises at least twice a week. This is in addition to the moderate-intensity exercise.  Spend less time sitting. Even light physical activity can be beneficial. Watch cholesterol and blood lipids Have your blood tested for lipids and cholesterol at 40 years of age, then have this test every 5 years. Have your cholesterol levels checked more often if:  Your lipid or cholesterol levels are high.  You are older than 40 years of age.  You are at high risk for heart disease. What should I know about cancer screening? Depending on your health history and family history, you may need to have cancer screening at various ages. This may include screening for:  Breast cancer.  Cervical cancer.  Colorectal cancer.  Skin cancer.  Lung cancer. What should I know about heart disease, diabetes, and high blood  pressure? Blood pressure and heart disease  High blood pressure causes heart disease and increases the risk of stroke. This is more likely to develop in people who have high blood pressure readings, are of African descent, or are overweight.  Have your blood pressure checked: ? Every 3-5 years if you are 18-39 years of age. ? Every year if you are 40 years old or older. Diabetes Have regular diabetes screenings. This checks your fasting blood sugar level. Have the screening done:  Once every three years after age 40 if you are at a normal weight and have a low risk for diabetes.  More often and at a younger age if you are overweight or have a high risk for diabetes. What should I know about preventing infection? Hepatitis B If you have a higher risk for hepatitis B, you should be screened for this virus. Talk with your health care provider to find out if you are at risk for hepatitis B infection. Hepatitis C Testing is recommended for:  Everyone born from 1945 through 1965.  Anyone with known risk factors for hepatitis C. Sexually transmitted infections (STIs)  Get screened for STIs, including gonorrhea and chlamydia, if: ? You are sexually active and are younger than 40 years of age. ? You are older than 40 years of age and your health care provider tells you that you are at risk for this type of infection. ? Your sexual activity has changed since you were last screened, and you are at increased risk for chlamydia or gonorrhea. Ask your health care provider if   you are at risk.  Ask your health care provider about whether you are at high risk for HIV. Your health care provider may recommend a prescription medicine to help prevent HIV infection. If you choose to take medicine to prevent HIV, you should first get tested for HIV. You should then be tested every 3 months for as long as you are taking the medicine. Pregnancy  If you are about to stop having your period (premenopausal) and  you may become pregnant, seek counseling before you get pregnant.  Take 400 to 800 micrograms (mcg) of folic acid every day if you become pregnant.  Ask for birth control (contraception) if you want to prevent pregnancy. Osteoporosis and menopause Osteoporosis is a disease in which the bones lose minerals and strength with aging. This can result in bone fractures. If you are 65 years old or older, or if you are at risk for osteoporosis and fractures, ask your health care provider if you should:  Be screened for bone loss.  Take a calcium or vitamin D supplement to lower your risk of fractures.  Be given hormone replacement therapy (HRT) to treat symptoms of menopause. Follow these instructions at home: Lifestyle  Do not use any products that contain nicotine or tobacco, such as cigarettes, e-cigarettes, and chewing tobacco. If you need help quitting, ask your health care provider.  Do not use street drugs.  Do not share needles.  Ask your health care provider for help if you need support or information about quitting drugs. Alcohol use  Do not drink alcohol if: ? Your health care provider tells you not to drink. ? You are pregnant, may be pregnant, or are planning to become pregnant.  If you drink alcohol: ? Limit how much you use to 0-1 drink a day. ? Limit intake if you are breastfeeding.  Be aware of how much alcohol is in your drink. In the [place] one drink equals one 12 oz bottle of beer (355 mL), one 5 oz glass of wine (148 mL), or one 1 oz glass of hard liquor (44 mL). General instructions  Schedule regular health, dental, and eye exams.  Stay current with your vaccines.  Tell your health care provider if: ? You often feel depressed. ? You have ever been abused or do not feel safe at home. Summary  Adopting a healthy lifestyle and getting preventive care are important in promoting health and wellness.  Follow your health care provider's instructions about healthy  diet, exercising, and getting tested or screened for diseases.  Follow your health care provider's instructions on monitoring your cholesterol and blood pressure. This information is not intended to replace advice given to you by your health care provider. Make sure you discuss any questions you have with your health care provider. Document Released: 06/05/2011 Document Revised: 11/13/2018 Document Reviewed: 11/13/2018 Elsevier Patient Education  2020 [employer]vier Inc.  

## 2019-10-12 NOTE — Progress Notes (Signed)
BP 109/67   Pulse 83   Temp 98.9 F (37.2 C) (Temporal)   Ht '5\' 8"'  (1.727 m)   Wt 206 lb (93.4 kg)   SpO2 98%   BMI 31.32 kg/m    Subjective:    Patient ID: Terri Mckinney, female    DOB: 10-25-1981, 38 y.o.   MRN: [id number]  HPI: Terri Mckinney is a 38 y.o. female presenting on 10/07/2019 for Annual Exam  This patient comes in for her annual exam.  She states that overall she is doing very well and not having any difficulties.  She is working a lot.  She did have some time where she was laid off because of the COVID-19 but is doing a lot of hours now.  She denies any difficulty with her depression and anxiety and has been fairly stable recently.  She did have a small lesion in her groin that was a bump that she was concerned about.  She has had a problem with those in the past but never had to have them lanced or drained.  She also is continuing her Depo-Medrol for birth control and for cycle control.  Past Medical History:  Diagnosis Date  . Depression    Relevant past medical, surgical, family and social history reviewed and updated as indicated. Interim medical history since our last visit reviewed. Allergies and medications reviewed and updated. DATA REVIEWED: CHART IN EPIC  Family History reviewed for pertinent findings.  Review of Systems  Constitutional: Positive for fatigue. Negative for activity change and fever.  HENT: Negative.   Eyes: Negative.   Respiratory: Negative.  Negative for cough.   Cardiovascular: Negative.  Negative for chest pain.  Gastrointestinal: Negative.  Negative for abdominal pain.  Endocrine: Negative.   Genitourinary: Negative.  Negative for dysuria.  Musculoskeletal: Negative.   Skin: Negative.   Neurological: Negative.   Psychiatric/Behavioral: Positive for dysphoric mood.    Allergies as of 10/07/2019      Reactions   Wellbutrin [bupropion]    insomnia      Medication List       Accurate as of October 07, 2019 11:59 PM. If  you have any questions, ask your nurse or doctor.        cephALEXin 250 MG capsule Commonly known as: Keflex Take 1 capsule (250 mg total) by mouth 3 (three) times daily. Started by: Terald Sleeper, PA-C   medroxyPROGESTERone 150 MG/ML injection Commonly known as: DEPO-PROVERA INJECT 1 ML (150 MG TOTAL) INTO THE MUSCLE EVERY 3 (THREE) MONTHS.   Multi-Vitamins Tabs Take by mouth.          Objective:    BP 109/67   Pulse 83   Temp 98.9 F (37.2 C) (Temporal)   Ht '5\' 8"'  (1.727 m)   Wt 206 lb (93.4 kg)   SpO2 98%   BMI 31.32 kg/m   Allergies  Allergen Reactions  . Wellbutrin [Bupropion]     insomnia    Wt Readings from Last 3 Encounters:  10/07/19 206 lb (93.4 kg)  08/12/18 194 lb 9.6 oz (88.3 kg)  07/10/18 192 lb 3.2 oz (87.2 kg)    Physical Exam Constitutional:      Appearance: She is well-developed.  HENT:     Head: Normocephalic and atraumatic.  Eyes:     Conjunctiva/sclera: Conjunctivae normal.     Pupils: Pupils are equal, round, and reactive to light.  Neck:     Musculoskeletal: Normal range of motion and neck supple.  Cardiovascular:     Rate and Rhythm: Normal rate and regular rhythm.     Heart sounds: Normal heart sounds.  Pulmonary:     Effort: Pulmonary effort is normal.     Breath sounds: Normal breath sounds.  Chest:     Breasts: Breasts are symmetrical.        Right: No mass, skin change or tenderness.        Left: No mass, skin change or tenderness.  Abdominal:     General: Bowel sounds are normal.     Palpations: Abdomen is soft.  Genitourinary:    General: Normal vulva.     Labia:        Right: No tenderness or lesion.        Left: No tenderness or lesion.      Vagina: Normal. No vaginal discharge, tenderness or bleeding.     Cervix: No cervical motion tenderness, discharge or friability.     Uterus: Not deviated, not enlarged and not tender.      Adnexa:        Right: No mass, tenderness or fullness.         Left: No mass,  tenderness or fullness.       Rectum: No anal fissure.  Skin:    General: Skin is warm and dry.     Findings: No rash.  Neurological:     Mental Status: She is alert and oriented to person, place, and time.     Deep Tendon Reflexes: Reflexes are normal and symmetric.  Psychiatric:        Behavior: Behavior normal.        Thought Content: Thought content normal.        Judgment: Judgment normal.     Results for orders placed or performed in visit on 04/10/18  HM PAP SMEAR  Result Value Ref Range   HM Pap smear Normal-see report   HM PAP SMEAR  Result Value Ref Range   HM Pap smear normal   CBC with Differential/Platelet  Result Value Ref Range   WBC 9.5 3.4 - 10.8 x10E3/uL   RBC 4.48 3.77 - 5.28 x10E6/uL   Hemoglobin 14.0 11.1 - 15.9 g/dL   Hematocrit 41.2 34.0 - 46.6 %   MCV 92 79 - 97 fL   MCH 31.3 26.6 - 33.0 pg   MCHC 34.0 31.5 - 35.7 g/dL   RDW 12.3 12.3 - 15.4 %   Platelets 319 150 - 379 x10E3/uL   Neutrophils 55 Not Estab. %   Lymphs 35 Not Estab. %   Monocytes 6 Not Estab. %   Eos 4 Not Estab. %   Basos 0 Not Estab. %   Neutrophils Absolute 5.2 1.4 - 7.0 x10E3/uL   Lymphocytes Absolute 3.4 (H) 0.7 - 3.1 x10E3/uL   Monocytes Absolute 0.5 0.1 - 0.9 x10E3/uL   EOS (ABSOLUTE) 0.4 0.0 - 0.4 x10E3/uL   Basophils Absolute 0.0 0.0 - 0.2 x10E3/uL   Immature Granulocytes 0 Not Estab. %   Immature Grans (Abs) 0.0 0.0 - 0.1 x10E3/uL  CMP14+EGFR  Result Value Ref Range   Glucose 73 65 - 99 mg/dL   BUN 10 6 - 20 mg/dL   Creatinine, Ser 0.72 0.57 - 1.00 mg/dL   GFR calc non Af Amer 106 >59 mL/min/1.73   GFR calc Af Amer 122 >59 mL/min/1.73   BUN/Creatinine Ratio 14 9 - 23   Sodium 138 134 - 144 mmol/L   Potassium 4.2 3.5 - 5.2  mmol/L   Chloride 104 96 - 106 mmol/L   CO2 20 20 - 29 mmol/L   Calcium 8.9 8.7 - 10.2 mg/dL   Total Protein 6.4 6.0 - 8.5 g/dL   Albumin 4.3 3.5 - 5.5 g/dL   Globulin, Total 2.1 1.5 - 4.5 g/dL   Albumin/Globulin Ratio 2.0 1.2 - 2.2    Bilirubin Total 0.5 0.0 - 1.2 mg/dL   Alkaline Phosphatase 60 39 - 117 IU/L   AST 11 0 - 40 IU/L   ALT 18 0 - 32 IU/L  Lipid panel  Result Value Ref Range   Cholesterol, Total 187 100 - 199 mg/dL   Triglycerides 114 0 - 149 mg/dL   HDL 36 (L) >39 mg/dL   VLDL Cholesterol Cal 23 5 - 40 mg/dL   LDL Calculated 128 (H) 0 - 99 mg/dL   Chol/HDL Ratio 5.2 (H) 0.0 - 4.4 ratio      Assessment & Plan:   1. Well female exam with routine gynecological exam - CBC with Differential/Platelet; Future - CMP14+EGFR; Future - Lipid panel; Future - IGP, Aptima HPV, rfx 16/18,45   Continue all other maintenance medications as listed above.  Follow up plan: No follow-ups on file.  Educational handout given for health maintenance  Terald Sleeper PA-C [facility] [address]  [place],  [address] [phone]   10/12/2019, 3:46 PM

## 2019-10-14 LAB — IGP, APTIMA HPV, RFX 16/18,45: HPV Aptima: NEGATIVE

## 2019-10-17 ENCOUNTER — Telehealth: Payer: Self-pay | Admitting: Physician Assistant

## 2019-10-21 NOTE — Telephone Encounter (Signed)
Patient was given results 10/17/2019.

## 2019-10-22 ENCOUNTER — Other Ambulatory Visit: Payer: Self-pay

## 2019-10-23 ENCOUNTER — Other Ambulatory Visit: Payer: Self-pay

## 2019-10-23 ENCOUNTER — Ambulatory Visit (INDEPENDENT_AMBULATORY_CARE_PROVIDER_SITE_OTHER): Payer: BC Managed Care – PPO

## 2019-10-23 DIAGNOSIS — Z3042 Encounter for surveillance of injectable contraceptive: Secondary | ICD-10-CM

## 2019-10-23 NOTE — Progress Notes (Signed)
Medroxyprogesterone injection given to left deltoid.  Patient tolerated well.

## 2020-01-08 ENCOUNTER — Other Ambulatory Visit: Payer: Self-pay

## 2020-01-09 ENCOUNTER — Ambulatory Visit (INDEPENDENT_AMBULATORY_CARE_PROVIDER_SITE_OTHER): Payer: BC Managed Care – PPO | Admitting: *Deleted

## 2020-01-09 DIAGNOSIS — Z3042 Encounter for surveillance of injectable contraceptive: Secondary | ICD-10-CM | POA: Diagnosis not present

## 2020-01-09 MED ORDER — MEDROXYPROGESTERONE ACETATE 150 MG/ML IM SUSP
150.0000 mg | INTRAMUSCULAR | Status: DC
  Administered 2020-01-09 – 2022-01-25 (×8): 150 mg via INTRAMUSCULAR

## 2020-01-09 NOTE — Progress Notes (Signed)
Depo injection given and tolerated well

## 2020-04-02 ENCOUNTER — Other Ambulatory Visit: Payer: Self-pay

## 2020-04-02 ENCOUNTER — Ambulatory Visit (INDEPENDENT_AMBULATORY_CARE_PROVIDER_SITE_OTHER): Payer: BC Managed Care – PPO

## 2020-04-02 DIAGNOSIS — Z3042 Encounter for surveillance of injectable contraceptive: Secondary | ICD-10-CM

## 2020-06-17 ENCOUNTER — Other Ambulatory Visit: Payer: Self-pay | Admitting: *Deleted

## 2020-06-17 DIAGNOSIS — Z3042 Encounter for surveillance of injectable contraceptive: Secondary | ICD-10-CM

## 2020-06-17 MED ORDER — MEDROXYPROGESTERONE ACETATE 150 MG/ML IM SUSP: 150.0000 mg | INTRAMUSCULAR | 0 refills | Status: DC

## 2020-06-23 ENCOUNTER — Other Ambulatory Visit: Payer: Self-pay

## 2020-06-23 ENCOUNTER — Ambulatory Visit (INDEPENDENT_AMBULATORY_CARE_PROVIDER_SITE_OTHER): Payer: BC Managed Care – PPO | Admitting: *Deleted

## 2020-06-23 DIAGNOSIS — Z3042 Encounter for surveillance of injectable contraceptive: Secondary | ICD-10-CM | POA: Diagnosis not present

## 2020-06-23 NOTE — Progress Notes (Signed)
Depo Injection given and tolerated well.

## 2020-07-16 ENCOUNTER — Other Ambulatory Visit (HOSPITAL_COMMUNITY)
Admission: RE | Admit: 2020-07-16 | Discharge: 2020-07-16 | Disposition: A | Discharge status: Home / Self Care | Payer: BC Managed Care – PPO | Source: Ambulatory Visit | Attending: Family | Admitting: Family

## 2020-07-16 ENCOUNTER — Encounter: Payer: Self-pay | Admitting: Family

## 2020-07-16 ENCOUNTER — Ambulatory Visit (INDEPENDENT_AMBULATORY_CARE_PROVIDER_SITE_OTHER): Payer: BC Managed Care – PPO | Admitting: Family

## 2020-07-16 ENCOUNTER — Other Ambulatory Visit: Payer: Self-pay

## 2020-07-16 VITALS — BP 125/73 | HR 93 | Temp 98.0°F | Ht 68.0 in | Wt 194.4 lb

## 2020-07-16 DIAGNOSIS — Z0001 Encounter for general adult medical examination with abnormal findings: Secondary | ICD-10-CM | POA: Insufficient documentation

## 2020-07-16 DIAGNOSIS — R87610 Atypical squamous cells of undetermined significance on cytologic smear of cervix (ASC-US): Secondary | ICD-10-CM

## 2020-07-16 DIAGNOSIS — Z23 Encounter for immunization: Secondary | ICD-10-CM

## 2020-07-16 DIAGNOSIS — Z0000 Encounter for general adult medical examination without abnormal findings: Secondary | ICD-10-CM

## 2020-07-16 DIAGNOSIS — F17200 Nicotine dependence, unspecified, uncomplicated: Secondary | ICD-10-CM | POA: Diagnosis not present

## 2020-07-16 NOTE — Progress Notes (Signed)
Subjective:    Patient ID: Terri Mckinney, female    DOB: 1981-01-20, 39 y.o.   MRN: [id number]  Chief Complaint  Patient presents with  . Annual Exam    pap, spot on back she wants you to look at    Pt presents to the office today for CPE with pap. She reports she had an abnormal pap last year and wants to recheck. Her last pap was ATYPICAL SQUAMOUS CELLS OF UNDETERMINED SIGNIFICANCE and HPV negative.   She does take Depo Provera every 3 months. She reports she will "spot" every other shot for 2 days.  Gynecologic Exam The patient's pertinent negatives include no genital itching, vaginal bleeding or vaginal discharge.  Nicotine Dependence Presents for follow-up visit. Her urge triggers include company of smokers. She smokes 1 pack of cigarettes per day.      Review of Systems  Genitourinary: Negative for vaginal discharge.  All other systems reviewed and are negative.  Family History  Problem Relation Age of Onset  . Heart disease Mother   . Diabetes Mother   . Hearing loss Father   . Heart disease Father   . Hyperlipidemia Father   . Hypertension Father   . Stroke Father   . Arthritis Father   . Diabetes Father   . Learning disabilities Brother   . Diabetes Maternal Grandmother   . Heart disease Maternal Grandmother   . Diabetes Maternal Grandfather   . Diabetes Paternal Grandmother   . Diabetes Paternal Grandfather    Social History   Socioeconomic History  . Marital status: Married    Spouse name: Not on file  . Number of children: Not on file  . Years of education: Not on file  . Highest education level: Not on file  Occupational History  . Not on file  Tobacco Use  . Smoking status: Current Every Day Smoker    Packs/day: 1.00    Types: Cigarettes  . Smokeless tobacco: Never Used  Vaping Use  . Vaping Use: Never used  Substance and Sexual Activity  . Alcohol use: No  . Drug use: No  . Sexual activity: Yes    Birth control/protection: Injection    Other Topics Concern  . Not on file  Social History Narrative  . Not on file   Social Determinants of Health   Financial Resource Strain:   . Difficulty of Paying Living Expenses:   Food Insecurity:   . Worried About Charity fundraiser in the Last Year:   . Arboriculturist in the Last Year:   Transportation Needs:   . Film/video editor (Medical):   Marland Kitchen Lack of Transportation (Non-Medical):   Physical Activity:   . Days of Exercise per Week:   . Minutes of Exercise per Session:   Stress:   . Feeling of Stress :   Social Connections:   . Frequency of Communication with Friends and Family:   . Frequency of Social Gatherings with Friends and Family:   . Attends Religious Services:   . Active Member of Clubs or Organizations:   . Attends Archivist Meetings:   Marland Kitchen Marital Status:   Intimate Partner Violence:   . Fear of Current or Ex-Partner:   . Emotionally Abused:   Marland Kitchen Physically Abused:   . Sexually Abused:         Objective:   Physical Exam Vitals reviewed.  Constitutional:      General: She is not in acute distress.  Appearance: She is well-developed.  HENT:     Head: Normocephalic and atraumatic.     Right Ear: Tympanic membrane normal.     Left Ear: Tympanic membrane normal.  Eyes:     Pupils: Pupils are equal, round, and reactive to light.  Neck:     Thyroid: No thyromegaly.  Cardiovascular:     Rate and Rhythm: Normal rate and regular rhythm.     Heart sounds: Normal heart sounds. No murmur heard.   Pulmonary:     Effort: Pulmonary effort is normal. No respiratory distress.     Breath sounds: Normal breath sounds. No wheezing.  Abdominal:     General: Bowel sounds are normal. There is no distension.     Palpations: Abdomen is soft.     Tenderness: There is no abdominal tenderness.  Genitourinary:    General: Normal vulva.     Cervix: Discharge, friability, erythema and cervical bleeding present.     Adnexa:        Right: No mass,  tenderness or fullness.         Left: No tenderness or fullness.    Musculoskeletal:        General: No tenderness. Normal range of motion.     Cervical back: Normal range of motion and neck supple.  Skin:    General: Skin is warm and dry.  Neurological:     Mental Status: She is alert and oriented to person, place, and time.     Cranial Nerves: No cranial nerve deficit.     Deep Tendon Reflexes: Reflexes are normal and symmetric.  Psychiatric:        Behavior: Behavior normal.        Thought Content: Thought content normal.        Judgment: Judgment normal.      BP 125/73   Pulse 93   Temp 98 F (36.7 C) (Temporal)   Ht 5' 8" (1.727 m)   Wt 194 lb 6.4 oz (88.2 kg)   SpO2 99%   BMI 29.56 kg/m       Assessment & Plan:  Bowie Doiron comes in today with chief complaint of Annual Exam (pap, spot on back she wants you to look at )   Diagnosis and orders addressed:  1. Annual physical exam - CMP14+EGFR - CBC with Differential/Platelet - Lipid panel - TSH - HIV Antibody (routine testing w rflx) - Hepatitis C antibody  2. Current smoker - CMP14+EGFR - CBC with Differential/Platelet  3. Atypical squamous cells of undetermined significance on cytologic smear of cervix (ASC-US) - CMP14+EGFR - CBC with Differential/Platelet   Labs pending Health Maintenance reviewed Diet and exercise encouraged  Follow up plan: 1 year    Evelina Dun, FNP

## 2020-07-16 NOTE — Addendum Note (Signed)
Addended by: Evelina Dun A on: 07/16/2020 03:57 PM   Modules accepted: Orders

## 2020-07-16 NOTE — Patient Instructions (Signed)
Health Maintenance, Female Adopting a healthy lifestyle and getting preventive care are important in promoting health and wellness. Ask your health care provider about:  The right schedule for you to have regular tests and exams.  Things you can do on your own to prevent diseases and keep yourself healthy. What should I know about diet, weight, and exercise? Eat a healthy diet   Eat a diet that includes plenty of vegetables, fruits, low-fat dairy products, and lean protein.  Do not eat a lot of foods that are high in solid fats, added sugars, or sodium. Maintain a healthy weight Body mass index (BMI) is used to identify weight problems. It estimates body fat based on height and weight. Your health care provider can help determine your BMI and help you achieve or maintain a healthy weight. Get regular exercise Get regular exercise. This is one of the most important things you can do for your health. Most adults should:  Exercise for at least 150 minutes each week. The exercise should increase your heart rate and make you sweat (moderate-intensity exercise).  Do strengthening exercises at least twice a week. This is in addition to the moderate-intensity exercise.  Spend less time sitting. Even light physical activity can be beneficial. Watch cholesterol and blood lipids Have your blood tested for lipids and cholesterol at 41 years of age, then have this test every 5 years. Have your cholesterol levels checked more often if:  Your lipid or cholesterol levels are high.  You are older than 40 years of age.  You are at high risk for heart disease. What should I know about cancer screening? Depending on your health history and family history, you may need to have cancer screening at various ages. This may include screening for:  Breast cancer.  Cervical cancer.  Colorectal cancer.  Skin cancer.  Lung cancer. What should I know about heart disease, diabetes, and high blood  pressure? Blood pressure and heart disease  High blood pressure causes heart disease and increases the risk of stroke. This is more likely to develop in people who have high blood pressure readings, are of African descent, or are overweight.  Have your blood pressure checked: ? Every 3-5 years if you are 18-39 years of age. ? Every year if you are 40 years old or older. Diabetes Have regular diabetes screenings. This checks your fasting blood sugar level. Have the screening done:  Once every three years after age 40 if you are at a normal weight and have a low risk for diabetes.  More often and at a younger age if you are overweight or have a high risk for diabetes. What should I know about preventing infection? Hepatitis B If you have a higher risk for hepatitis B, you should be screened for this virus. Talk with your health care provider to find out if you are at risk for hepatitis B infection. Hepatitis C Testing is recommended for:  Everyone born from 1945 through 1965.  Anyone with known risk factors for hepatitis C. Sexually transmitted infections (STIs)  Get screened for STIs, including gonorrhea and chlamydia, if: ? You are sexually active and are younger than 41 years of age. ? You are older than 41 years of age and your health care provider tells you that you are at risk for this type of infection. ? Your sexual activity has changed since you were last screened, and you are at increased risk for chlamydia or gonorrhea. Ask your health care provider if   you are at risk.  Ask your health care provider about whether you are at high risk for HIV. Your health care provider may recommend a prescription medicine to help prevent HIV infection. If you choose to take medicine to prevent HIV, you should first get tested for HIV. You should then be tested every 3 months for as long as you are taking the medicine. Pregnancy  If you are about to stop having your period (premenopausal) and  you may become pregnant, seek counseling before you get pregnant.  Take 400 to 800 micrograms (mcg) of folic acid every day if you become pregnant.  Ask for birth control (contraception) if you want to prevent pregnancy. Osteoporosis and menopause Osteoporosis is a disease in which the bones lose minerals and strength with aging. This can result in bone fractures. If you are 65 years old or older, or if you are at risk for osteoporosis and fractures, ask your health care provider if you should:  Be screened for bone loss.  Take a calcium or vitamin D supplement to lower your risk of fractures.  Be given hormone replacement therapy (HRT) to treat symptoms of menopause. Follow these instructions at home: Lifestyle  Do not use any products that contain nicotine or tobacco, such as cigarettes, e-cigarettes, and chewing tobacco. If you need help quitting, ask your health care provider.  Do not use street drugs.  Do not share needles.  Ask your health care provider for help if you need support or information about quitting drugs. Alcohol use  Do not drink alcohol if: ? Your health care provider tells you not to drink. ? You are pregnant, may be pregnant, or are planning to become pregnant.  If you drink alcohol: ? Limit how much you use to 0-1 drink a day. ? Limit intake if you are breastfeeding.  Be aware of how much alcohol is in your drink. In the [place] one drink equals one 12 oz bottle of beer (355 mL), one 5 oz glass of wine (148 mL), or one 1 oz glass of hard liquor (44 mL). General instructions  Schedule regular health, dental, and eye exams.  Stay current with your vaccines.  Tell your health care provider if: ? You often feel depressed. ? You have ever been abused or do not feel safe at home. Summary  Adopting a healthy lifestyle and getting preventive care are important in promoting health and wellness.  Follow your health care provider's instructions about healthy  diet, exercising, and getting tested or screened for diseases.  Follow your health care provider's instructions on monitoring your cholesterol and blood pressure. This information is not intended to replace advice given to you by your health care provider. Make sure you discuss any questions you have with your health care provider. Document Revised: 11/13/2018 Document Reviewed: 11/13/2018 Elsevier Patient Education  2020 [employer]  

## 2020-07-17 LAB — CMP14+EGFR
ALT: 12 IU/L (ref 0–32)
AST: 12 IU/L (ref 0–40)
Albumin/Globulin Ratio: 2.1 (ref 1.2–2.2)
Albumin: 4.2 g/dL (ref 3.8–4.8)
Alkaline Phosphatase: 66 IU/L (ref 48–121)
BUN/Creatinine Ratio: 14 (ref 9–23)
BUN: 12 mg/dL (ref 6–24)
Bilirubin Total: <0.2 mg/dL (ref 0.0–1.2)
CO2: 20 mmol/L (ref 20–29)
Calcium: 9.3 mg/dL (ref 8.7–10.2)
Chloride: 107 mmol/L — ABNORMAL HIGH (ref 96–106)
Creatinine, Ser: 0.86 mg/dL (ref 0.57–1.00)
GFR calc Af Amer: 97 mL/min/{1.73_m2} (ref >59)
GFR calc non Af Amer: 84 mL/min/{1.73_m2} (ref >59)
Globulin, Total: 2 g/dL (ref 1.5–4.5)
Glucose: 86 mg/dL (ref 65–99)
Potassium: 4.5 mmol/L (ref 3.5–5.2)
Sodium: 140 mmol/L (ref 134–144)
Total Protein: 6.2 g/dL (ref 6.0–8.5)

## 2020-07-17 LAB — LIPID PANEL
Chol/HDL Ratio: 5 ratio — ABNORMAL HIGH (ref 0.0–4.4)
Cholesterol, Total: 165 mg/dL (ref 100–199)
HDL: 33 mg/dL — ABNORMAL LOW (ref >39)
LDL Chol Calc (NIH): 106 mg/dL — ABNORMAL HIGH (ref 0–99)
Triglycerides: 145 mg/dL (ref 0–149)
VLDL Cholesterol Cal: 26 mg/dL (ref 5–40)

## 2020-07-17 LAB — CBC WITH DIFFERENTIAL/PLATELET
Basophils Absolute: 0.1 10*3/uL (ref 0.0–0.2)
Basos: 1 %
EOS (ABSOLUTE): 0.3 10*3/uL (ref 0.0–0.4)
Eos: 3 %
Hematocrit: 41.1 % (ref 34.0–46.6)
Hemoglobin: 14 g/dL (ref 11.1–15.9)
Immature Grans (Abs): 0 10*3/uL (ref 0.0–0.1)
Immature Granulocytes: 0 %
Lymphocytes Absolute: 3.4 10*3/uL — ABNORMAL HIGH (ref 0.7–3.1)
Lymphs: 35 %
MCH: 31.8 pg (ref 26.6–33.0)
MCHC: 34.1 g/dL (ref 31.5–35.7)
MCV: 93 fL (ref 79–97)
Monocytes Absolute: 0.6 10*3/uL (ref 0.1–0.9)
Monocytes: 6 %
Neutrophils Absolute: 5.3 10*3/uL (ref 1.4–7.0)
Neutrophils: 55 %
Platelets: 309 10*3/uL (ref 150–450)
RBC: 4.4 x10E6/uL (ref 3.77–5.28)
RDW: 11.5 % — ABNORMAL LOW (ref 11.7–15.4)
WBC: 9.7 10*3/uL (ref 3.4–10.8)

## 2020-07-17 LAB — TSH: TSH: 0.873 u[IU]/mL (ref 0.450–4.500)

## 2020-07-17 LAB — HEPATITIS C ANTIBODY: Hep C Virus Ab: <0.1 s/co ratio (ref 0.0–0.9)

## 2020-07-17 LAB — HIV ANTIBODY (ROUTINE TESTING W REFLEX): HIV Screen 4th Generation wRfx: NONREACTIVE

## 2020-07-21 LAB — CYTOLOGY - PAP
Chlamydia: NEGATIVE
Comment: NEGATIVE
Comment: NEGATIVE
Comment: NEGATIVE
Comment: NORMAL
Diagnosis: UNDETERMINED — AB
High risk HPV: NEGATIVE
Neisseria Gonorrhea: NEGATIVE
Trichomonas: NEGATIVE

## 2020-09-12 ENCOUNTER — Other Ambulatory Visit: Payer: Self-pay | Admitting: Family

## 2020-09-12 DIAGNOSIS — Z3042 Encounter for surveillance of injectable contraceptive: Secondary | ICD-10-CM

## 2020-09-17 ENCOUNTER — Ambulatory Visit (INDEPENDENT_AMBULATORY_CARE_PROVIDER_SITE_OTHER): Payer: BC Managed Care – PPO | Admitting: *Deleted

## 2020-09-17 ENCOUNTER — Other Ambulatory Visit: Payer: Self-pay

## 2020-09-17 DIAGNOSIS — Z3042 Encounter for surveillance of injectable contraceptive: Secondary | ICD-10-CM

## 2020-09-17 MED ORDER — MEDROXYPROGESTERONE ACETATE 150 MG/ML IM SUSP
150.0000 mg | Freq: Once | INTRAMUSCULAR | Status: AC
  Administered 2020-09-17: 150 mg via INTRAMUSCULAR

## 2020-09-17 NOTE — Patient Instructions (Signed)
Patient in today for Depo Provera injection. 150 mg given IM in right deltoid. Patient tolerated well.

## 2020-12-10 ENCOUNTER — Other Ambulatory Visit: Payer: Self-pay

## 2020-12-10 ENCOUNTER — Ambulatory Visit (INDEPENDENT_AMBULATORY_CARE_PROVIDER_SITE_OTHER): Payer: BC Managed Care – PPO

## 2020-12-10 DIAGNOSIS — Z3042 Encounter for surveillance of injectable contraceptive: Secondary | ICD-10-CM | POA: Diagnosis not present

## 2020-12-10 MED ORDER — MEDROXYPROGESTERONE ACETATE 150 MG/ML IM SUSP
150.0000 mg | Freq: Once | INTRAMUSCULAR | Status: AC
  Administered 2020-12-10: 150 mg via INTRAMUSCULAR

## 2020-12-10 NOTE — Progress Notes (Signed)
Depo prevara given to patient and tolerated well.

## 2021-02-28 ENCOUNTER — Ambulatory Visit (INDEPENDENT_AMBULATORY_CARE_PROVIDER_SITE_OTHER): Payer: BC Managed Care – PPO

## 2021-02-28 ENCOUNTER — Other Ambulatory Visit: Payer: Self-pay

## 2021-02-28 DIAGNOSIS — Z3042 Encounter for surveillance of injectable contraceptive: Secondary | ICD-10-CM | POA: Diagnosis not present

## 2021-02-28 NOTE — Progress Notes (Signed)
Medroxyprogesterone injection given to patient in Left deltoid per patient request and tolerated well.

## 2021-05-18 ENCOUNTER — Other Ambulatory Visit: Payer: Self-pay

## 2021-05-18 ENCOUNTER — Ambulatory Visit (INDEPENDENT_AMBULATORY_CARE_PROVIDER_SITE_OTHER): Payer: BC Managed Care – PPO | Admitting: *Deleted

## 2021-05-18 DIAGNOSIS — Z3042 Encounter for surveillance of injectable contraceptive: Secondary | ICD-10-CM | POA: Diagnosis not present

## 2021-05-18 NOTE — Progress Notes (Signed)
Depoprovera given and patient tolerated well.  

## 2021-07-18 ENCOUNTER — Encounter: Payer: Self-pay | Admitting: Family Medicine

## 2021-07-18 ENCOUNTER — Ambulatory Visit: Payer: BC Managed Care – PPO | Admitting: Family Medicine

## 2021-07-18 DIAGNOSIS — U071 COVID-19: Secondary | ICD-10-CM

## 2021-07-18 NOTE — Progress Notes (Signed)
   Virtual Visit  Note Due to COVID-19 pandemic this visit was conducted virtually. This visit type was conducted due to national recommendations for restrictions regarding the COVID-19 Pandemic (e.g. social distancing, sheltering in place) in an effort to limit this patient's exposure and mitigate transmission in our community. All issues noted in this document were discussed and addressed.  A physical exam was not performed with this format.  I connected with Terri Mckinney on 07/18/21 at 1335 by telephone and verified that I am speaking with the correct person using two identifiers. Terri Mckinney is currently located at home and no one is currently with her during the visit. The provider, Gwenlyn Perking, FNP is located in their office at time of visit.  I discussed the limitations, risks, security and privacy concerns of performing an evaluation and management service by telephone and the availability of in person appointments. I also discussed with the patient that there may be a patient responsible charge related to this service. The patient expressed understanding and agreed to proceed.  CC: Covid positive  History and Present Illness:  HPI Terri Mckinney reports a positive home Covid test this morning. She reports cough, runny nose, congestion, and chills that started 4 days ago. She denies sore throat, body aches, fever, shortness of breath, chest pain, nausea, vomiting, or diarrhea. She has been taking tylenol, elderberry, and oscillococcnum for her symptoms. Her work requires her to stay home for 5 days from the date of the positive test.     ROS As per HPI.   Observations/Objective: Alert and oriented x 3. Able to speak in full sentences without difficulty.   Assessment and Plan: Terri Mckinney was seen today for covid positive.  Diagnoses and all orders for this visit:  COVID-19 Mild symptoms x 4 days. Discussed symptomatic care, quarantine, return precautions, and when to seek  emergency care.    Follow Up Instructions: Return to office for new or worsening symptoms, or if symptoms persist.     I discussed the assessment and treatment plan with the patient. The patient was provided an opportunity to ask questions and all were answered. The patient agreed with the plan and demonstrated an understanding of the instructions.   The patient was advised to call back or seek an in-person evaluation if the symptoms worsen or if the condition fails to improve as anticipated.  The above assessment and management plan was discussed with the patient. The patient verbalized understanding of and has agreed to the management plan. Patient is aware to call the clinic if symptoms persist or worsen. Patient is aware when to return to the clinic for a follow-up visit. Patient educated on when it is appropriate to go to the emergency department.   Time call ended:  1346  I provided 11 minutes of  non face-to-face time during this encounter.    Gwenlyn Perking, FNP

## 2021-08-10 ENCOUNTER — Telehealth: Payer: Self-pay | Admitting: Family

## 2021-08-10 ENCOUNTER — Other Ambulatory Visit: Payer: Self-pay | Admitting: Family

## 2021-08-10 DIAGNOSIS — Z3042 Encounter for surveillance of injectable contraceptive: Secondary | ICD-10-CM

## 2021-08-10 MED ORDER — MEDROXYPROGESTERONE ACETATE 150 MG/ML IM SUSP: 150.0000 mg | INTRAMUSCULAR | 0 refills | Status: DC

## 2021-08-10 NOTE — Telephone Encounter (Signed)
Pt has appt w/ nurse here tomorrow for her Depo shot which is within the range from the last shot Aware refill sent to pharmacy and to keep appt with Alyse Low next month

## 2021-08-10 NOTE — Telephone Encounter (Signed)
Pt called stating that she was told from pharmacy that she could not pick up her Depo Shot because we denied for refill because pt needs appt.  Pt scheduled appt to see Christy on 9/19 for CPE (first available) but needs to know what to do about her shot because she was told by the nurse that she needed to get her depo shot within 12 wks of when she got her last one and 9/19 is past the 12 wk mark.  Please advise and call patient.

## 2021-08-11 ENCOUNTER — Ambulatory Visit (INDEPENDENT_AMBULATORY_CARE_PROVIDER_SITE_OTHER): Payer: BC Managed Care – PPO

## 2021-08-11 ENCOUNTER — Other Ambulatory Visit: Payer: Self-pay

## 2021-08-11 DIAGNOSIS — Z3042 Encounter for surveillance of injectable contraceptive: Secondary | ICD-10-CM | POA: Diagnosis not present

## 2021-08-11 NOTE — Progress Notes (Signed)
Medroxyprogesterone injection given to right deltoid per patient request.  Patient tolerated well. 

## 2021-08-22 ENCOUNTER — Other Ambulatory Visit: Payer: Self-pay

## 2021-08-22 ENCOUNTER — Ambulatory Visit (INDEPENDENT_AMBULATORY_CARE_PROVIDER_SITE_OTHER): Payer: BC Managed Care – PPO | Admitting: Family

## 2021-08-22 ENCOUNTER — Encounter: Payer: Self-pay | Admitting: Family

## 2021-08-22 VITALS — BP 119/73 | HR 86 | Temp 98.3°F | Ht 68.0 in | Wt 160.4 lb

## 2021-08-22 DIAGNOSIS — K649 Unspecified hemorrhoids: Secondary | ICD-10-CM

## 2021-08-22 DIAGNOSIS — Z0001 Encounter for general adult medical examination with abnormal findings: Secondary | ICD-10-CM

## 2021-08-22 DIAGNOSIS — H6123 Impacted cerumen, bilateral: Secondary | ICD-10-CM

## 2021-08-22 DIAGNOSIS — H938X2 Other specified disorders of left ear: Secondary | ICD-10-CM | POA: Diagnosis not present

## 2021-08-22 DIAGNOSIS — Z0000 Encounter for general adult medical examination without abnormal findings: Secondary | ICD-10-CM

## 2021-08-22 DIAGNOSIS — Z136 Encounter for screening for cardiovascular disorders: Secondary | ICD-10-CM | POA: Diagnosis not present

## 2021-08-22 MED ORDER — HYDROCORTISONE ACETATE 25 MG RE SUPP: 25.0000 mg | Freq: Two times a day (BID) | RECTAL | 0 refills | Status: DC

## 2021-08-22 NOTE — Progress Notes (Signed)
Subjective:    Patient ID: Terri Mckinney, female    DOB: 06/21/1981, 40 y.o.   MRN: [id number]  Chief Complaint  Patient presents with   Annual Exam    NO PAP   Ear Fullness    EFT EAR    Hemorrhoids    ENTERAL AND EXTERNAL JUST STARTED STOOL SOFTNER AND PREPERATION h WIPES TO HELP   Pt presents to the office today for CPE without pap. She is complaining of hemorrhoids after having constipation. She has been using OTC with mild relief.  Ear Fullness  There is pain in the left ear. This is a new problem. The current episode started 1 to 4 weeks ago. Episode frequency: intermittently. The problem has been waxing and waning. There has been no fever. The patient is experiencing no pain. Associated symptoms include hearing loss. Pertinent negatives include no coughing, ear discharge, headaches, rhinorrhea, sore throat or vomiting. She has tried nothing for the symptoms.  Nicotine Dependence Presents for follow-up visit. Symptoms are negative for sore throat. Her urge triggers include company of smokers. The symptoms have been stable. She smokes 1 pack of cigarettes per day.     Review of Systems  HENT:  Positive for hearing loss. Negative for ear discharge, rhinorrhea and sore throat.   Respiratory:  Negative for cough.   Gastrointestinal:  Negative for vomiting.  Neurological:  Negative for headaches.  All other systems reviewed and are negative.     Objective:   Physical Exam Vitals reviewed.  Constitutional:      General: She is not in acute distress.    Appearance: She is well-developed.  HENT:     Head: Normocephalic and atraumatic.     Right Ear: Tympanic membrane normal.     Left Ear: Tympanic membrane normal.  Eyes:     Pupils: Pupils are equal, round, and reactive to light.  Neck:     Thyroid: No thyromegaly.  Cardiovascular:     Rate and Rhythm: Normal rate and regular rhythm.     Heart sounds: Normal heart sounds. No murmur heard. Pulmonary:     Effort:  Pulmonary effort is normal. No respiratory distress.     Breath sounds: Normal breath sounds. No wheezing.  Abdominal:     General: Bowel sounds are normal. There is no distension.     Palpations: Abdomen is soft.     Tenderness: There is no abdominal tenderness.  Musculoskeletal:        General: No tenderness. Normal range of motion.     Cervical back: Normal range of motion and neck supple.  Skin:    General: Skin is warm and dry.  Neurological:     Mental Status: She is alert and oriented to person, place, and time.     Cranial Nerves: No cranial nerve deficit.     Deep Tendon Reflexes: Reflexes are normal and symmetric.  Psychiatric:        Behavior: Behavior normal.        Thought Content: Thought content normal.        Judgment: Judgment normal.   Bilateral ears washed with warm water and peroxide. Unable to remove all cerumen.    BP 119/73   Pulse 86   Temp 98.3 F (36.8 C) (Temporal)   Ht '5\' 8"'  (1.727 m)   Wt 160 lb 6.4 oz (72.8 kg)   BMI 24.39 kg/m      Assessment & Plan:  Terri Mckinney comes in today with chief complaint  of Annual Exam (NO PAP), Ear Fullness (EFT EAR ), and Hemorrhoids (ENTERAL AND EXTERNAL JUST STARTED STOOL SOFTNER AND PREPERATION h WIPES TO HELP)   Diagnosis and orders addressed:  1. Annual physical exam - CMP14+EGFR - CBC with Differential/Platelet - Lipid panel - TSH  2. Sensation of fullness in left ear - CMP14+EGFR - CBC with Differential/Platelet  3. Hemorrhoids, unspecified hemorrhoid type - hydrocortisone (ANUSOL-HC) 25 MG suppository; Place 1 suppository (25 mg total) rectally 2 (two) times daily.  Dispense: 12 suppository; Refill: 0 - CMP14+EGFR - CBC with Differential/Platelet  4. Bilateral impacted cerumen - CMP14+EGFR - CBC with Differential/Platelet -Use Debrox drops  Labs pending Health Maintenance reviewed Diet and exercise encouraged  Follow up plan: 1 year    Evelina Dun, FNP

## 2021-08-22 NOTE — Patient Instructions (Signed)
Hemorrhoids Hemorrhoids are swollen veins in and around the rectum or anus. There are two types of hemorrhoids: Internal hemorrhoids. These occur in the veins that are just inside the rectum. They may poke through to the outside and become irritated and painful. External hemorrhoids. These occur in the veins that are outside the anus and can be felt as a painful swelling or hard lump near the anus. Most hemorrhoids do not cause serious problems, and they can be managed with home treatments such as diet and lifestyle changes. If home treatments do not help the symptoms, procedures can be done to shrink or remove the hemorrhoids. What are the causes? This condition is caused by increased pressure in the anal area. This pressure may result from various things, including: Constipation. Straining to have a bowel movement. Diarrhea. Pregnancy. Obesity. Sitting for long periods of time. Heavy lifting or other activity that causes you to strain. Anal sex. Riding a bike for a long period of time. What are the signs or symptoms? Symptoms of this condition include: Pain. Anal itching or irritation. Rectal bleeding. Leakage of stool (feces). Anal swelling. One or more lumps around the anus. How is this diagnosed? This condition can often be diagnosed through a visual exam. Other exams or tests may also be done, such as: An exam that involves feeling the rectal area with a gloved hand (digital rectal exam). An exam of the anal canal that is done using a small tube (anoscope). A blood test, if you have lost a significant amount of blood. A test to look inside the colon using a flexible tube with a camera on the end (sigmoidoscopy or colonoscopy). How is this treated? This condition can usually be treated at home. However, various procedures may be done if dietary changes, lifestyle changes, and other home treatments do not help your symptoms. These procedures can help make the hemorrhoids smaller or  remove them completely. Some of these procedures involve surgery, and others do not. Common procedures include: Rubber band ligation. Rubber bands are placed at the base of the hemorrhoids to cut off their blood supply. Sclerotherapy. Medicine is injected into the hemorrhoids to shrink them. Infrared coagulation. A type of light energy is used to get rid of the hemorrhoids. Hemorrhoidectomy surgery. The hemorrhoids are surgically removed, and the veins that supply them are tied off. Stapled hemorrhoidopexy surgery. The surgeon staples the base of the hemorrhoid to the rectal wall. Follow these instructions at home: Eating and drinking  Eat foods that have a lot of fiber in them, such as whole grains, beans, nuts, fruits, and vegetables. Ask your health care provider about taking products that have added fiber (fiber supplements). Reduce the amount of fat in your diet. You can do this by eating low-fat dairy products, eating less red meat, and avoiding processed foods. Drink enough fluid to keep your urine pale yellow. Managing pain and swelling  Take warm sitz baths for 20 minutes, 3-4 times a day to ease pain and discomfort. You may do this in a bathtub or using a portable sitz bath that fits over the toilet. If directed, apply ice to the affected area. Using ice packs between sitz baths may be helpful. Put ice in a plastic bag. Place a towel between your skin and the bag. Leave the ice on for 20 minutes, 2-3 times a day. General instructions Take over-the-counter and prescription medicines only as told by your health care provider. Use medicated creams or suppositories as told. Get regular exercise.   Ask your health care provider how much and what kind of exercise is best for you. In general, you should do moderate exercise for at least 30 minutes on most days of the week (150 minutes each week). This can include activities such as walking, biking, or yoga. Go to the bathroom when you have  the urge to have a bowel movement. Do not wait. Avoid straining to have bowel movements. Keep the anal area dry and clean. Use wet toilet paper or moist towelettes after a bowel movement. Do not sit on the toilet for long periods of time. This increases blood pooling and pain. Keep all follow-up visits as told by your health care provider. This is important. Contact a health care provider if you have: Increasing pain and swelling that are not controlled by treatment or medicine. Difficulty having a bowel movement, or you are unable to have a bowel movement. Pain or inflammation outside the area of the hemorrhoids. Get help right away if you have: Uncontrolled bleeding from your rectum. Summary Hemorrhoids are swollen veins in and around the rectum or anus. Most hemorrhoids can be managed with home treatments such as diet and lifestyle changes. Taking warm sitz baths can help ease pain and discomfort. In severe cases, procedures or surgery can be done to shrink or remove the hemorrhoids. This information is not intended to replace advice given to you by your health care provider. Make sure you discuss any questions you have with your health care provider. Document Revised: 04/18/2019 Document Reviewed: 04/11/2018 Elsevier Patient Education  2022 [place], Adult The ears produce a substance called earwax that helps keep bacteria out of the ear and protects the skin in the ear canal. Occasionally, earwax can build up in the ear and cause discomfort or hearing loss. What are the causes? This condition is caused by a buildup of earwax. Ear canals are self-cleaning. Ear wax is made in the outer part of the ear canal and generally falls out in small amounts over time. When the self-cleaning mechanism is not working, earwax builds up and can cause decreased hearing and discomfort. Attempting to clean ears with cotton swabs can push the earwax deep into the ear canal and cause  decreased hearing and pain. What increases the risk? This condition is more likely to develop in people who: Clean their ears often with cotton swabs. Pick at their ears. Use earplugs or in-ear headphones often, or wear hearing aids. The following factors may also make you more likely to develop this condition: Being female. Being of older age. Naturally producing more earwax. Having narrow ear canals. Having earwax that is overly thick or sticky. Having excess hair in the ear canal. Having eczema. Being dehydrated. What are the signs or symptoms? Symptoms of this condition include: Reduced or muffled hearing. A feeling of fullness in the ear or feeling that the ear is plugged. Fluid coming from the ear. Ear pain or an itchy ear. Ringing in the ear. Coughing. Balance problems. An obvious piece of earwax that can be seen inside the ear canal. How is this diagnosed? This condition may be diagnosed based on: Your symptoms. Your medical history. An ear exam. During the exam, your health care provider will look into your ear with an instrument called an otoscope. You may have tests, including a hearing test. How is this treated? This condition may be treated by: Using ear drops to soften the earwax. Having the earwax removed by a health care provider. The  health care provider may: Flush the ear with water. Use an instrument that has a loop on the end (curette). Use a suction device. Having surgery to remove the wax buildup. This may be done in severe cases. Follow these instructions at home:  Take over-the-counter and prescription medicines only as told by your health care provider. Do not put any objects, including cotton swabs, into your ear. You can clean the opening of your ear canal with a washcloth or facial tissue. Follow instructions from your health care provider about cleaning your ears. Do not overclean your ears. Drink enough fluid to keep your urine pale yellow. This  will help to thin the earwax. Keep all follow-up visits as told. If earwax builds up in your ears often or if you use hearing aids, consider seeing your health care provider for routine, preventive ear cleanings. Ask your health care provider how often you should schedule your cleanings. If you have hearing aids, clean them according to instructions from the manufacturer and your health care provider. Contact a health care provider if: You have ear pain. You develop a fever. You have pus or other fluid coming from your ear. You have hearing loss. You have ringing in your ears that does not go away. You feel like the room is spinning (vertigo). Your symptoms do not improve with treatment. Get help right away if: You have bleeding from the affected ear. You have severe ear pain. Summary Earwax can build up in the ear and cause discomfort or hearing loss. The most common symptoms of this condition include reduced or muffled hearing, a feeling of fullness in the ear, or feeling that the ear is plugged. This condition may be diagnosed based on your symptoms, your medical history, and an ear exam. This condition may be treated by using ear drops to soften the earwax or by having the earwax removed by a health care provider. Do not put any objects, including cotton swabs, into your ear. You can clean the opening of your ear canal with a washcloth or facial tissue. This information is not intended to replace advice given to you by your health care provider. Make sure you discuss any questions you have with your health care provider. Document Revised: 03/09/2020 Document Reviewed: 03/09/2020 Elsevier Patient Education  [place].

## 2021-08-23 LAB — CMP14+EGFR
ALT: 10 IU/L (ref 0–32)
AST: 11 IU/L (ref 0–40)
Albumin/Globulin Ratio: 2.7 — ABNORMAL HIGH (ref 1.2–2.2)
Albumin: 4.6 g/dL (ref 3.8–4.8)
Alkaline Phosphatase: 59 IU/L (ref 44–121)
BUN/Creatinine Ratio: 12 (ref 9–23)
BUN: 10 mg/dL (ref 6–24)
Bilirubin Total: 0.4 mg/dL (ref 0.0–1.2)
CO2: 20 mmol/L (ref 20–29)
Calcium: 9.5 mg/dL (ref 8.7–10.2)
Chloride: 104 mmol/L (ref 96–106)
Creatinine, Ser: 0.81 mg/dL (ref 0.57–1.00)
Globulin, Total: 1.7 g/dL (ref 1.5–4.5)
Glucose: 72 mg/dL (ref 65–99)
Potassium: 4.1 mmol/L (ref 3.5–5.2)
Sodium: 140 mmol/L (ref 134–144)
Total Protein: 6.3 g/dL (ref 6.0–8.5)
eGFR: 93 mL/min/{1.73_m2} (ref >59)

## 2021-08-23 LAB — LIPID PANEL
Chol/HDL Ratio: 4.9 ratio — ABNORMAL HIGH (ref 0.0–4.4)
Cholesterol, Total: 170 mg/dL (ref 100–199)
HDL: 35 mg/dL — ABNORMAL LOW (ref >39)
LDL Chol Calc (NIH): 115 mg/dL — ABNORMAL HIGH (ref 0–99)
Triglycerides: 109 mg/dL (ref 0–149)
VLDL Cholesterol Cal: 20 mg/dL (ref 5–40)

## 2021-08-23 LAB — CBC WITH DIFFERENTIAL/PLATELET
Basophils Absolute: 0.1 10*3/uL (ref 0.0–0.2)
Basos: 1 %
EOS (ABSOLUTE): 0.2 10*3/uL (ref 0.0–0.4)
Eos: 2 %
Hematocrit: 41.1 % (ref 34.0–46.6)
Hemoglobin: 14.2 g/dL (ref 11.1–15.9)
Immature Grans (Abs): 0 10*3/uL (ref 0.0–0.1)
Immature Granulocytes: 0 %
Lymphocytes Absolute: 3.7 10*3/uL — ABNORMAL HIGH (ref 0.7–3.1)
Lymphs: 33 %
MCH: 32.4 pg (ref 26.6–33.0)
MCHC: 34.5 g/dL (ref 31.5–35.7)
MCV: 94 fL (ref 79–97)
Monocytes Absolute: 0.4 10*3/uL (ref 0.1–0.9)
Monocytes: 4 %
Neutrophils Absolute: 6.7 10*3/uL (ref 1.4–7.0)
Neutrophils: 60 %
Platelets: 309 10*3/uL (ref 150–450)
RBC: 4.38 x10E6/uL (ref 3.77–5.28)
RDW: 11.9 % (ref 11.7–15.4)
WBC: 11.2 10*3/uL — ABNORMAL HIGH (ref 3.4–10.8)

## 2021-08-23 LAB — TSH: TSH: 0.916 u[IU]/mL (ref 0.450–4.500)

## 2021-10-31 ENCOUNTER — Other Ambulatory Visit: Payer: Self-pay | Admitting: Family

## 2021-10-31 DIAGNOSIS — Z3042 Encounter for surveillance of injectable contraceptive: Secondary | ICD-10-CM

## 2021-11-02 ENCOUNTER — Ambulatory Visit (INDEPENDENT_AMBULATORY_CARE_PROVIDER_SITE_OTHER): Payer: BC Managed Care – PPO | Admitting: *Deleted

## 2021-11-02 DIAGNOSIS — Z3042 Encounter for surveillance of injectable contraceptive: Secondary | ICD-10-CM | POA: Diagnosis not present

## 2022-01-25 ENCOUNTER — Ambulatory Visit (INDEPENDENT_AMBULATORY_CARE_PROVIDER_SITE_OTHER): Payer: BC Managed Care – PPO | Admitting: *Deleted

## 2022-01-25 DIAGNOSIS — Z3042 Encounter for surveillance of injectable contraceptive: Secondary | ICD-10-CM

## 2022-01-25 NOTE — Progress Notes (Signed)
Pt given Depo Provera 150mg  IM right deltoid and tolerated well.

## 2022-01-27 ENCOUNTER — Ambulatory Visit: Payer: BC Managed Care – PPO | Admitting: Family

## 2022-01-27 ENCOUNTER — Encounter: Payer: Self-pay | Admitting: Family

## 2022-01-27 VITALS — BP 116/65 | HR 83 | Temp 97.9°F | Ht 68.0 in | Wt 145.8 lb

## 2022-01-27 DIAGNOSIS — M545 Low back pain, unspecified: Secondary | ICD-10-CM | POA: Diagnosis not present

## 2022-01-27 DIAGNOSIS — F17200 Nicotine dependence, unspecified, uncomplicated: Secondary | ICD-10-CM | POA: Diagnosis not present

## 2022-01-27 DIAGNOSIS — F411 Generalized anxiety disorder: Secondary | ICD-10-CM

## 2022-01-27 DIAGNOSIS — R197 Diarrhea, unspecified: Secondary | ICD-10-CM

## 2022-01-27 MED ORDER — DICLOFENAC SODIUM 75 MG PO TBEC: 75.0000 mg | DELAYED_RELEASE_TABLET | Freq: Two times a day (BID) | ORAL | 1 refills | Status: DC

## 2022-01-27 MED ORDER — BACLOFEN 10 MG PO TABS: 10.0000 mg | ORAL_TABLET | Freq: Three times a day (TID) | ORAL | 1 refills | Status: DC

## 2022-01-27 NOTE — Patient Instructions (Signed)
Diarrhea, Adult ?Diarrhea is frequent loose and watery bowel movements. Diarrhea can make you feel weak and cause you to become dehydrated. Dehydration can make you tired and thirsty, cause you to have a dry mouth, and decrease how often you urinate. ?Diarrhea typically lasts 2-3 days. However, it can last longer if it is a sign of something more serious. It is important to treat your diarrhea as told by your health care provider. ?Follow these instructions at home: ?Eating and drinking ?  ?Follow these recommendations as told by your health care provider: ?Take an oral rehydration solution (ORS). This is an over-the-counter medicine that helps return your body to its normal balance of nutrients and water. It is found at pharmacies and retail stores. ?Drink plenty of fluids, such as water, ice chips, diluted fruit juice, and low-calorie sports drinks. You can drink milk also, if desired. ?Avoid drinking fluids that contain a lot of sugar or caffeine, such as energy drinks, sports drinks, and soda. ?Eat bland, easy-to-digest foods in small amounts as you are able. These foods include bananas, applesauce, rice, lean meats, toast, and crackers. ?Avoid alcohol. ?Avoid spicy or fatty foods. ? ?Medicines ?Take over-the-counter and prescription medicines only as told by your health care provider. ?If you were prescribed an antibiotic medicine, take it as told by your health care provider. Do not stop using the antibiotic even if you start to feel better. ?General instructions ? ?Wash your hands often using soap and water. If soap and water are not available, use a hand sanitizer. Others in the household should wash their hands as well. Hands should be washed: ?After using the toilet or changing a diaper. ?Before preparing, cooking, or serving food. ?While caring for a sick person or while visiting someone in a hospital. ?Drink enough fluid to keep your urine pale yellow. ?Rest at home while you recover. ?Watch your  condition for any changes. ?Take a warm bath to relieve any burning or pain from frequent diarrhea episodes. ?Keep all follow-up visits as told by your health care provider. This is important. ?Contact a health care provider if: ?You have a fever. ?Your diarrhea gets worse. ?You have new symptoms. ?You cannot keep fluids down. ?You feel light-headed or dizzy. ?You have a headache. ?You have muscle cramps. ?Get help right away if: ?You have chest pain. ?You feel extremely weak or you faint. ?You have bloody or black stools or stools that look like tar. ?You have severe pain, cramping, or bloating in your abdomen. ?You have trouble breathing or you are breathing very quickly. ?Your heart is beating very quickly. ?Your skin feels cold and clammy. ?You feel confused. ?You have signs of dehydration, such as: ?Dark urine, very little urine, or no urine. ?Cracked lips. ?Dry mouth. ?Sunken eyes. ?Sleepiness. ?Weakness. ?Summary ?Diarrhea is frequent loose and sometimes watery bowel movements. Diarrhea can make you feel weak and cause you to become dehydrated. ?Drink enough fluids to keep your urine pale yellow. ?Make sure that you wash your hands after using the toilet. If soap and water are not available, use hand sanitizer. ?Contact a health care provider if your diarrhea gets worse or you have new symptoms. ?Get help right away if you have signs of dehydration. ?This information is not intended to replace advice given to you by your health care provider. Make sure you discuss any questions you have with your health care provider. ?Document Revised: 06/01/2021 Document Reviewed: 06/01/2021 ?Elsevier Patient Education ? 2022 Elsevier Inc. ? ?

## 2022-01-27 NOTE — Progress Notes (Signed)
Subjective:    Patient ID: Terri Mckinney, female    DOB: September 21, 1981, 41 y.o.   MRN: [id number]  Chief Complaint  Patient presents with   Diarrhea    3-4 weeks anti-diarrhea over the counter no help., going through separation with husband    Sciatica   Pt presents to the office today with diarrhea that started 3-4 weeks that is unchanged. She states some days are worse then other, but states she could go as much as 10 times a day. She reports she has abdominal pain while having a BM that will bend her over.   She is currently going through a divorce and this is causing a great deal of stress for her.  Diarrhea  This is a new problem. The current episode started 1 to 4 weeks ago. The problem occurs 5 to 10 times per day. The problem has been waxing and waning. The stool consistency is described as Watery. The patient states that diarrhea awakens her from sleep. Associated symptoms include increased flatus. Pertinent negatives include no bloating, chills, fever, headaches or vomiting. There are no known risk factors. She has tried anti-motility drug for the symptoms. The treatment provided no relief.  Back Pain This is a new problem. The current episode started 1 to 4 weeks ago. The problem occurs constantly. The problem has been waxing and waning since onset. The pain is present in the lumbar spine. The quality of the pain is described as aching. The pain is at a severity of 8/10. The pain is moderate. The symptoms are aggravated by bending and twisting. Pertinent negatives include no fever or headaches. She has tried NSAIDs for the symptoms. The treatment provided mild relief.     Review of Systems  Constitutional:  Negative for chills and fever.  Gastrointestinal:  Positive for diarrhea and flatus. Negative for bloating and vomiting.  Musculoskeletal:  Positive for back pain.  Neurological:  Negative for headaches.  All other systems reviewed and are negative.     Objective:    Physical Exam Vitals reviewed.  Constitutional:      General: She is not in acute distress.    Appearance: She is well-developed.  HENT:     Head: Normocephalic and atraumatic.  Eyes:     Pupils: Pupils are equal, round, and reactive to light.  Neck:     Thyroid: No thyromegaly.  Cardiovascular:     Rate and Rhythm: Normal rate and regular rhythm.     Heart sounds: Normal heart sounds. No murmur heard. Pulmonary:     Effort: Pulmonary effort is normal. No respiratory distress.     Breath sounds: Normal breath sounds. No wheezing.  Abdominal:     General: Bowel sounds are normal. There is no distension.     Palpations: Abdomen is soft.     Tenderness: There is no abdominal tenderness.  Musculoskeletal:        General: No tenderness. Normal range of motion.     Cervical back: Normal range of motion and neck supple.  Skin:    General: Skin is warm and dry.  Neurological:     Mental Status: She is alert and oriented to person, place, and time.     Cranial Nerves: No cranial nerve deficit.     Deep Tendon Reflexes: Reflexes are normal and symmetric.  Psychiatric:        Behavior: Behavior normal.        Thought Content: Thought content normal.  Judgment: Judgment normal.     BP 116/65    Pulse 83    Temp 97.9 F (36.6 C) (Temporal)    Ht 5' 8" (1.727 m)    Wt 145 lb 12.8 oz (66.1 kg)    BMI 22.17 kg/m       Assessment & Plan:  Terri Mckinney comes in today with chief complaint of Diarrhea (3-4 weeks anti-diarrhea over the counter no help., going through separation with husband ) and Sciatica   Diagnosis and orders addressed:  1. Diarrhea, unspecified type Imodium  Force fluids Labs pending  - Cdiff NAA+O+P+Stool Culture - CMP14+EGFR - CBC with Differential/Platelet  2. Acute low back pain, unspecified back pain laterality, unspecified whether sciatica present Rest Ice No other NSAID's  - CMP14+EGFR - CBC with Differential/Platelet - diclofenac  (VOLTAREN) 75 MG EC tablet; Take 1 tablet (75 mg total) by mouth 2 (two) times daily.  Dispense: 60 tablet; Refill: 1 - baclofen (LIORESAL) 10 MG tablet; Take 1 tablet (10 mg total) by mouth 3 (three) times daily.  Dispense: 60 each; Refill: 1  3. Current smoker  - CMP14+EGFR - CBC with Differential/Platelet   4. GAD (generalized anxiety disorder) Stress management    Evelina Dun, FNP

## 2022-01-28 LAB — CBC WITH DIFFERENTIAL/PLATELET
Basophils Absolute: 0.1 10*3/uL (ref 0.0–0.2)
Basos: 1 %
EOS (ABSOLUTE): 1.3 10*3/uL — ABNORMAL HIGH (ref 0.0–0.4)
Eos: 10 %
Hematocrit: 37.7 % (ref 34.0–46.6)
Hemoglobin: 12.8 g/dL (ref 11.1–15.9)
Immature Grans (Abs): 0 10*3/uL (ref 0.0–0.1)
Immature Granulocytes: 0 %
Lymphocytes Absolute: 4.3 10*3/uL — ABNORMAL HIGH (ref 0.7–3.1)
Lymphs: 33 %
MCH: 31.4 pg (ref 26.6–33.0)
MCHC: 34 g/dL (ref 31.5–35.7)
MCV: 92 fL (ref 79–97)
Monocytes Absolute: 0.6 10*3/uL (ref 0.1–0.9)
Monocytes: 4 %
Neutrophils Absolute: 6.9 10*3/uL (ref 1.4–7.0)
Neutrophils: 52 %
Platelets: 370 10*3/uL (ref 150–450)
RBC: 4.08 x10E6/uL (ref 3.77–5.28)
RDW: 11.4 % — ABNORMAL LOW (ref 11.7–15.4)
WBC: 13.3 10*3/uL — ABNORMAL HIGH (ref 3.4–10.8)

## 2022-01-28 LAB — CMP14+EGFR
ALT: 13 IU/L (ref 0–32)
AST: 10 IU/L (ref 0–40)
Albumin/Globulin Ratio: 1.9 (ref 1.2–2.2)
Albumin: 4.2 g/dL (ref 3.8–4.8)
Alkaline Phosphatase: 57 IU/L (ref 44–121)
BUN/Creatinine Ratio: 12 (ref 9–23)
BUN: 8 mg/dL (ref 6–24)
Bilirubin Total: <0.2 mg/dL (ref 0.0–1.2)
CO2: 21 mmol/L (ref 20–29)
Calcium: 9.4 mg/dL (ref 8.7–10.2)
Chloride: 106 mmol/L (ref 96–106)
Creatinine, Ser: 0.68 mg/dL (ref 0.57–1.00)
Globulin, Total: 2.2 g/dL (ref 1.5–4.5)
Glucose: 77 mg/dL (ref 70–99)
Potassium: 4.5 mmol/L (ref 3.5–5.2)
Sodium: 141 mmol/L (ref 134–144)
Total Protein: 6.4 g/dL (ref 6.0–8.5)
eGFR: 111 mL/min/{1.73_m2} (ref >59)

## 2022-01-30 ENCOUNTER — Other Ambulatory Visit: Payer: BC Managed Care – PPO

## 2022-01-30 DIAGNOSIS — R197 Diarrhea, unspecified: Secondary | ICD-10-CM | POA: Diagnosis not present

## 2022-02-07 ENCOUNTER — Other Ambulatory Visit: Payer: Self-pay | Admitting: Family

## 2022-02-07 LAB — CDIFF NAA+O+P+STOOL CULTURE
E coli, Shiga toxin Assay: NEGATIVE
Toxigenic C. Difficile by PCR: NEGATIVE

## 2022-02-07 MED ORDER — CIPROFLOXACIN HCL 500 MG PO TABS: 500.0000 mg | ORAL_TABLET | Freq: Two times a day (BID) | ORAL | 0 refills | Status: AC

## 2022-02-07 MED ORDER — METRONIDAZOLE 500 MG PO TABS: 500.0000 mg | ORAL_TABLET | Freq: Three times a day (TID) | ORAL | 0 refills | Status: AC

## 2022-03-03 ENCOUNTER — Ambulatory Visit: Payer: BC Managed Care – PPO | Admitting: Family

## 2022-03-03 ENCOUNTER — Encounter: Payer: Self-pay | Admitting: Family

## 2022-03-03 VITALS — BP 121/75 | HR 84 | Temp 97.8°F | Ht 68.0 in | Wt 142.6 lb

## 2022-03-03 DIAGNOSIS — R1030 Lower abdominal pain, unspecified: Secondary | ICD-10-CM

## 2022-03-03 DIAGNOSIS — R197 Diarrhea, unspecified: Secondary | ICD-10-CM | POA: Diagnosis not present

## 2022-03-03 DIAGNOSIS — R140 Abdominal distension (gaseous): Secondary | ICD-10-CM

## 2022-03-03 NOTE — Patient Instructions (Signed)
dDiarrhea, Adult ?Diarrhea is frequent loose and watery bowel movements. Diarrhea can make you feel weak and cause you to become dehydrated. Dehydration can make you tired and thirsty, cause you to have a dry mouth, and decrease how often you urinate. ?Diarrhea typically lasts 2-3 days. However, it can last longer if it is a sign of something more serious. It is important to treat your diarrhea as told by your health care provider. ?Follow these instructions at home: ?Eating and drinking ?  ?Follow these recommendations as told by your health care provider: ?Take an oral rehydration solution (ORS). This is an over-the-counter medicine that helps return your body to its normal balance of nutrients and water. It is found at pharmacies and retail stores. ?Drink plenty of fluids, such as water, ice chips, diluted fruit juice, and low-calorie sports drinks. You can drink milk also, if desired. ?Avoid drinking fluids that contain a lot of sugar or caffeine, such as energy drinks, sports drinks, and soda. ?Eat bland, easy-to-digest foods in small amounts as you are able. These foods include bananas, applesauce, rice, lean meats, toast, and crackers. ?Avoid alcohol. ?Avoid spicy or fatty foods. ? ?Medicines ?Take over-the-counter and prescription medicines only as told by your health care provider. ?If you were prescribed an antibiotic medicine, take it as told by your health care provider. Do not stop using the antibiotic even if you start to feel better. ?General instructions ? ?Wash your hands often using soap and water. If soap and water are not available, use a hand sanitizer. Others in the household should wash their hands as well. Hands should be washed: ?After using the toilet or changing a diaper. ?Before preparing, cooking, or serving food. ?While caring for a sick person or while visiting someone in a hospital. ?Drink enough fluid to keep your urine pale yellow. ?Rest at home while you recover. ?Watch your  condition for any changes. ?Take a warm bath to relieve any burning or pain from frequent diarrhea episodes. ?Keep all follow-up visits as told by your health care provider. This is important. ?Contact a health care provider if: ?You have a fever. ?Your diarrhea gets worse. ?You have new symptoms. ?You cannot keep fluids down. ?You feel light-headed or dizzy. ?You have a headache. ?You have muscle cramps. ?Get help right away if: ?You have chest pain. ?You feel extremely weak or you faint. ?You have bloody or black stools or stools that look like tar. ?You have severe pain, cramping, or bloating in your abdomen. ?You have trouble breathing or you are breathing very quickly. ?Your heart is beating very quickly. ?Your skin feels cold and clammy. ?You feel confused. ?You have signs of dehydration, such as: ?Dark urine, very little urine, or no urine. ?Cracked lips. ?Dry mouth. ?Sunken eyes. ?Sleepiness. ?Weakness. ?Summary ?Diarrhea is frequent loose and sometimes watery bowel movements. Diarrhea can make you feel weak and cause you to become dehydrated. ?Drink enough fluids to keep your urine pale yellow. ?Make sure that you wash your hands after using the toilet. If soap and water are not available, use hand sanitizer. ?Contact a health care provider if your diarrhea gets worse or you have new symptoms. ?Get help right away if you have signs of dehydration. ?This information is not intended to replace advice given to you by your health care provider. Make sure you discuss any questions you have with your health care provider. ?Document Revised: 06/01/2021 Document Reviewed: 06/01/2021 ?Elsevier Patient Education ? [employer]. ? ?

## 2022-03-03 NOTE — Progress Notes (Signed)
? ?Subjective:  ? ? Patient ID: Terri Mckinney, female    DOB: [birth year], 41 y.o.   MRN: [id number] ? ?Chief Complaint  ?Patient presents with  ? Abdominal Pain  ? Diarrhea  ? ?PT presents to the office today with continued abdominal pain, diarrhea, and bloating for the last two months. She had a negative stool panel. She completed Flagyl and cipro. States this did not help.  ?Abdominal Pain ?This is a new problem. The current episode started more than 1 month ago. The pain is located in the LLQ and RLQ. The pain is at a severity of 7/10. The pain is moderate. The quality of the pain is cramping. Associated symptoms include diarrhea and flatus. Pertinent negatives include no fever, headaches or vomiting.  ?Diarrhea  ?This is a new problem. The current episode started more than 1 month ago. The problem occurs more than 10 times per day. The stool consistency is described as Watery and mucous. The patient states that diarrhea awakens her from sleep. Associated symptoms include abdominal pain, bloating and increased flatus. Pertinent negatives include no chills, fever, headaches or vomiting. She has tried anti-motility drug and bismuth subsalicylate for the symptoms. The treatment provided moderate relief.  ? ? ? ?Review of Systems  ?Constitutional:  Negative for chills and fever.  ?Gastrointestinal:  Positive for abdominal pain, bloating, diarrhea and flatus. Negative for vomiting.  ?Neurological:  Negative for headaches.  ? ?   ?Objective:  ? Physical Exam ?Vitals reviewed.  ?Constitutional:   ?   General: She is not in acute distress. ?   Appearance: She is well-developed.  ?HENT:  ?   Head: Normocephalic and atraumatic.  ?   Right Ear: External ear normal.  ?Eyes:  ?   Pupils: Pupils are equal, round, and reactive to light.  ?Neck:  ?   Thyroid: No thyromegaly.  ?Cardiovascular:  ?   Rate and Rhythm: Normal rate and regular rhythm.  ?   Heart sounds: Normal heart sounds. No murmur heard. ?Pulmonary:  ?   Effort:  Pulmonary effort is normal. No respiratory distress.  ?   Breath sounds: Normal breath sounds. No wheezing.  ?Abdominal:  ?   General: Bowel sounds are normal. There is no distension.  ?   Palpations: Abdomen is soft.  ?   Tenderness: There is abdominal tenderness in the right lower quadrant and left lower quadrant.  ?Musculoskeletal:     ?   General: No tenderness. Normal range of motion.  ?   Cervical back: Normal range of motion and neck supple.  ?Skin: ?   General: Skin is warm and dry.  ?Neurological:  ?   Mental Status: She is alert and oriented to person, place, and time.  ?   Cranial Nerves: No cranial nerve deficit.  ?   Deep Tendon Reflexes: Reflexes are normal and symmetric.  ?Psychiatric:     ?   Behavior: Behavior normal.     ?   Thought Content: Thought content normal.     ?   Judgment: Judgment normal.  ? ? ? ?BP 121/75   Pulse 84   Temp 97.8 ?F (36.6 ?C) (Temporal)   Ht '5\' 8"'  (1.727 m)   Wt 142 lb 9.6 oz (64.7 kg)   BMI 21.68 kg/m?  ? ? ?   ?Assessment & Plan:  ?Terri Mckinney comes in today with chief complaint of Abdominal Pain and Diarrhea ? ? ?Diagnosis and orders addressed: ? ?1. Lower abdominal pain ?- Ambulatory  referral to Gastroenterology ?- CMP14+EGFR ?- CBC with Differential/Platelet ? ?2. Diarrhea, unspecified type ?- Ambulatory referral to Gastroenterology ?- CMP14+EGFR ?- CBC with Differential/Platelet ?- CT Abdomen Pelvis W Contrast; Future ? ?3. Bloating ?- Ambulatory referral to Gastroenterology ?- CMP14+EGFR ?- CBC with Differential/Platelet ? ? ?Labs pending ?Health Maintenance reviewed  ?Stat CT abd ordered ? ? ?Evelina Dun, FNP ? ? ? ?

## 2022-03-04 LAB — CBC WITH DIFFERENTIAL/PLATELET
Basophils Absolute: 0.1 10*3/uL (ref 0.0–0.2)
Basos: 1 %
EOS (ABSOLUTE): 0.9 10*3/uL — ABNORMAL HIGH (ref 0.0–0.4)
Eos: 8 %
Hematocrit: 35.4 % (ref 34.0–46.6)
Hemoglobin: 12.3 g/dL (ref 11.1–15.9)
Immature Grans (Abs): 0 10*3/uL (ref 0.0–0.1)
Immature Granulocytes: 0 %
Lymphocytes Absolute: 3.7 10*3/uL — ABNORMAL HIGH (ref 0.7–3.1)
Lymphs: 34 %
MCH: 31.5 pg (ref 26.6–33.0)
MCHC: 34.7 g/dL (ref 31.5–35.7)
MCV: 91 fL (ref 79–97)
Monocytes Absolute: 0.6 10*3/uL (ref 0.1–0.9)
Monocytes: 5 %
Neutrophils Absolute: 5.7 10*3/uL (ref 1.4–7.0)
Neutrophils: 52 %
Platelets: 351 10*3/uL (ref 150–450)
RBC: 3.91 x10E6/uL (ref 3.77–5.28)
RDW: 12.1 % (ref 11.7–15.4)
WBC: 11 10*3/uL — ABNORMAL HIGH (ref 3.4–10.8)

## 2022-03-04 LAB — CMP14+EGFR
ALT: 9 IU/L (ref 0–32)
AST: 9 IU/L (ref 0–40)
Albumin/Globulin Ratio: 2.2 (ref 1.2–2.2)
Albumin: 4 g/dL (ref 3.8–4.8)
Alkaline Phosphatase: 49 IU/L (ref 44–121)
BUN/Creatinine Ratio: 13 (ref 9–23)
BUN: 10 mg/dL (ref 6–24)
Bilirubin Total: <0.2 mg/dL (ref 0.0–1.2)
CO2: 21 mmol/L (ref 20–29)
Calcium: 8.8 mg/dL (ref 8.7–10.2)
Chloride: 111 mmol/L — ABNORMAL HIGH (ref 96–106)
Creatinine, Ser: 0.75 mg/dL (ref 0.57–1.00)
Globulin, Total: 1.8 g/dL (ref 1.5–4.5)
Glucose: 61 mg/dL — ABNORMAL LOW (ref 70–99)
Potassium: 4.5 mmol/L (ref 3.5–5.2)
Sodium: 144 mmol/L (ref 134–144)
Total Protein: 5.8 g/dL — ABNORMAL LOW (ref 6.0–8.5)
eGFR: 101 mL/min/{1.73_m2} (ref >59)

## 2022-03-07 ENCOUNTER — Telehealth: Payer: Self-pay | Admitting: Family

## 2022-03-08 ENCOUNTER — Ambulatory Visit (HOSPITAL_BASED_OUTPATIENT_CLINIC_OR_DEPARTMENT_OTHER)
Admission: RE | Admit: 2022-03-08 | Discharge: 2022-03-08 | Disposition: A | Discharge status: Home / Self Care | Payer: BC Managed Care – PPO | Source: Ambulatory Visit | Attending: Family | Admitting: Family

## 2022-03-08 ENCOUNTER — Encounter (HOSPITAL_BASED_OUTPATIENT_CLINIC_OR_DEPARTMENT_OTHER): Payer: Self-pay

## 2022-03-08 ENCOUNTER — Other Ambulatory Visit: Payer: Self-pay | Admitting: Family

## 2022-03-08 DIAGNOSIS — R109 Unspecified abdominal pain: Secondary | ICD-10-CM | POA: Diagnosis not present

## 2022-03-08 DIAGNOSIS — R197 Diarrhea, unspecified: Secondary | ICD-10-CM | POA: Insufficient documentation

## 2022-03-08 DIAGNOSIS — K6389 Other specified diseases of intestine: Secondary | ICD-10-CM

## 2022-03-08 MED ORDER — IOHEXOL 300 MG/ML  SOLN
100.0000 mL | Freq: Once | INTRAMUSCULAR | Status: AC | PRN
  Administered 2022-03-08: 75 mL via INTRAVENOUS

## 2022-03-09 ENCOUNTER — Ambulatory Visit (INDEPENDENT_AMBULATORY_CARE_PROVIDER_SITE_OTHER): Payer: BC Managed Care – PPO | Admitting: Internal Medicine

## 2022-03-09 ENCOUNTER — Encounter: Payer: Self-pay | Admitting: Internal Medicine

## 2022-03-09 ENCOUNTER — Other Ambulatory Visit: Payer: Self-pay

## 2022-03-09 ENCOUNTER — Encounter: Payer: Self-pay | Admitting: Gastroenterology

## 2022-03-09 VITALS — BP 116/64 | HR 92 | Temp 97.7°F | Ht 68.0 in | Wt 139.8 lb

## 2022-03-09 DIAGNOSIS — K769 Liver disease, unspecified: Secondary | ICD-10-CM | POA: Diagnosis not present

## 2022-03-09 DIAGNOSIS — K6289 Other specified diseases of anus and rectum: Secondary | ICD-10-CM

## 2022-03-09 DIAGNOSIS — R197 Diarrhea, unspecified: Secondary | ICD-10-CM

## 2022-03-09 DIAGNOSIS — R1032 Left lower quadrant pain: Secondary | ICD-10-CM

## 2022-03-09 DIAGNOSIS — K6389 Other specified diseases of intestine: Secondary | ICD-10-CM | POA: Diagnosis not present

## 2022-03-09 MED ORDER — PEG 3350-KCL-NA BICARB-NACL 420 G PO SOLR: 4000.0000 mL | ORAL | 0 refills | Status: DC

## 2022-03-09 NOTE — Progress Notes (Signed)
? ? ?Primary Care Physician:  Sharion Balloon, FNP ?Primary Gastroenterologist:  Dr. Abbey Chatters ? ?Chief Complaint  ?Patient presents with  ? Rectal Pain  ?  Pt here for rectal mass  ? ? ?HPI:   ?Terri Mckinney is a 41 y.o. female who presents to the clinic today by urgent referral from her PCP Evelina Dun for evaluation.  Patient notes to 33-monthhistory of diarrhea as well as left lower quadrant abdominal pain.  Pain moderate, intermittent, does not radiate. ? ? Was trialed on antibiotics which did not help her symptoms.  Underwent CT abdomen pelvis with contrast 03/08/22 which showed masslike thickening of the sigmoid colon/rectum with adenopathy findings concerning for colonic malignancy.  Liver also showed findings concerning for possible hepatic metastasis. ? ?Patient denies any family history of colorectal malignancy.  No melena, does note some recent rectal bleeding.  No weight loss. ? ?Past Medical History:  ?Diagnosis Date  ? Depression   ? ? ?No past surgical history on file. ? ?Current Outpatient Medications  ?Medication Sig Dispense Refill  ? calcium carbonate (OS-CAL) 1250 (500 Ca) MG chewable tablet Chew 1 tablet by mouth daily.    ? Cholecalciferol (VITAMIN D3) 30 MCG/15ML LIQD Take by mouth.    ? ibuprofen (ADVIL) 400 MG tablet Take 400 mg by mouth every 6 (six) hours as needed.    ? medroxyPROGESTERone (DEPO-PROVERA) 150 MG/ML injection INJECT 1 ML (150 MG TOTAL) INTO THE MUSCLE EVERY 3 (THREE) MONTHS 1 mL 2  ? Multiple Vitamin (MULTI-VITAMINS) TABS Take by mouth.    ? baclofen (LIORESAL) 10 MG tablet Take 1 tablet (10 mg total) by mouth 3 (three) times daily. (Patient not taking: Reported on 03/09/2022) 60 each 1  ? diclofenac (VOLTAREN) 75 MG EC tablet Take 1 tablet (75 mg total) by mouth 2 (two) times daily. (Patient not taking: Reported on 03/03/2022) 60 tablet 1  ? hydrocortisone (ANUSOL-HC) 25 MG suppository Place 1 suppository (25 mg total) rectally 2 (two) times daily. (Patient not taking:  Reported on 03/03/2022) 12 suppository 0  ? UNABLE TO FIND Med Name: dyocimin otc (Patient not taking: Reported on 03/03/2022)    ? ?No current facility-administered medications for this visit.  ? ? ?Allergies as of 03/09/2022 - Review Complete 03/03/2022  ?Allergen Reaction Noted  ? Wellbutrin [bupropion]  06/10/2018  ? ? ?Family History  ?Problem Relation Age of Onset  ? Heart disease Mother   ? Diabetes Mother   ? Hearing loss Father   ? Heart disease Father   ? Hyperlipidemia Father   ? Hypertension Father   ? Stroke Father   ? Arthritis Father   ? Diabetes Father   ? Learning disabilities Brother   ? Diabetes Maternal Grandmother   ? Heart disease Maternal Grandmother   ? Diabetes Maternal Grandfather   ? Diabetes Paternal Grandmother   ? Diabetes Paternal Grandfather   ? ? ?Social History  ? ?Socioeconomic History  ? Marital status: Married  ?  Spouse name: Not on file  ? Number of children: Not on file  ? Years of education: Not on file  ? Highest education level: Not on file  ?Occupational History  ? Not on file  ?Tobacco Use  ? Smoking status: Every Day  ?  Packs/day: 1.00  ?  Types: Cigarettes  ? Smokeless tobacco: Never  ?Vaping Use  ? Vaping Use: Never used  ?Substance and Sexual Activity  ? Alcohol use: No  ? Drug use: No  ?  Sexual activity: Yes  ?  Birth control/protection: Injection  ?Other Topics Concern  ? Not on file  ?Social History Narrative  ? Not on file  ? ?Social Determinants of Health  ? ?Financial Resource Strain: Not on file  ?Food Insecurity: Not on file  ?Transportation Needs: Not on file  ?Physical Activity: Not on file  ?Stress: Not on file  ?Social Connections: Not on file  ?Intimate Partner Violence: Not on file  ? ? ?Subjective: ?Review of Systems  ?Constitutional:  Negative for chills and fever.  ?HENT:  Negative for congestion and hearing loss.   ?Eyes:  Negative for blurred vision and double vision.  ?Respiratory:  Negative for cough and shortness of breath.   ?Cardiovascular:   Negative for chest pain and palpitations.  ?Gastrointestinal:  Positive for abdominal pain and diarrhea. Negative for blood in stool, constipation, heartburn, melena and vomiting.  ?Genitourinary:  Negative for dysuria and urgency.  ?Musculoskeletal:  Negative for joint pain and myalgias.  ?Skin:  Negative for itching and rash.  ?Neurological:  Negative for dizziness and headaches.  ?Psychiatric/Behavioral:  Negative for depression. The patient is not nervous/anxious.    ? ? ? ?Objective: ?BP 116/64   Pulse 92   Temp 97.7 ?F (36.5 ?C)   Ht '5\' 8"'$  (1.727 m)   Wt 139 lb 12.8 oz (63.4 kg)   BMI 21.26 kg/m?  ?Physical Exam ?Constitutional:   ?   Appearance: Normal appearance.  ?HENT:  ?   Head: Normocephalic and atraumatic.  ?Eyes:  ?   Extraocular Movements: Extraocular movements intact.  ?   Conjunctiva/sclera: Conjunctivae normal.  ?Cardiovascular:  ?   Rate and Rhythm: Normal rate and regular rhythm.  ?Pulmonary:  ?   Effort: Pulmonary effort is normal.  ?   Breath sounds: Normal breath sounds.  ?Abdominal:  ?   General: Bowel sounds are normal.  ?   Palpations: Abdomen is soft.  ?Musculoskeletal:     ?   General: No swelling. Normal range of motion.  ?   Cervical back: Normal range of motion and neck supple.  ?Skin: ?   General: Skin is warm and dry.  ?   Coloration: Skin is not jaundiced.  ?Neurological:  ?   General: No focal deficit present.  ?   Mental Status: She is alert and oriented to person, place, and time.  ?Psychiatric:     ?   Mood and Affect: Mood normal.     ?   Behavior: Behavior normal.  ? ? ? ?Assessment: ?*Sigmoid/Rectal mass ?*Diarrhea ?*Liver lesions ?*Abdominal pain-LLQ ? ?Plan: ?Discussed patient's CT in depth with her today.  We will schedule for colonoscopy as soon as possible to further evaluate.  I did counsel her that I am highly concerned for malignancy. ? ?The risks including infection, bleed, or perforation as well as benefits, limitations, alternatives and imponderables have been  reviewed with the patient. Questions have been answered. All parties agreeable. ? ?Discussed case with Dr. Raliegh Ip in regards to imaging of her liver lesions.  He recommended holding off on MRI until after colonoscopy at which point we can likely order PET scan if malignancy identified. ? ?Further recommendations to follow. ? ?03/09/2022 1:53 PM ? ? ?Disclaimer: This note was dictated with voice recognition software. Similar sounding words can inadvertently be transcribed and may not be corrected upon review. ? ?

## 2022-03-09 NOTE — Patient Instructions (Signed)
We will schedule you for colonoscopy as soon as possible to further evaluate the mass in your colon. ? ?I have reached out to the oncologist in regards to imaging.  We may pursue MRI in the interim.  I am also going to go ahead and make referral to his office.  We will call you and let you know after I have heard back from him. ? ?I would advise you to use MiraLAX daily to keep your bowels soft. ? ?It was very nice meeting you today. ? ?Dr. Abbey Chatters ? ?At [facility] we value your feedback. You may receive a survey about your visit today. Please share your experience as we strive to create trusting relationships with our patients to provide genuine, compassionate, quality care. ? ?We appreciate your understanding and patience as we review any laboratory studies, imaging, and other diagnostic tests that are ordered as we care for you. Our office policy is 5 business days for review of these results, and any emergent or urgent results are addressed in a timely manner for your best interest. If you do not hear from our office in 1 week, please contact us.  ? ?We also encourage the use of MyChart, which contains your medical information for your review as well. If you are not enrolled in this feature, an access code is on this after visit summary for your convenience. Thank you for allowing [place] to be involved in your care. ? ?It was great to see you today!  I hope you have a great rest of your Spring! ? ? ? ?Elon Alas. Abbey Chatters, D.O. ?Gastroenterology and Hepatology ?[facility] ? ?

## 2022-03-09 NOTE — H&P (View-Only) (Signed)
? ? ?Primary Care Physician:  Sharion Balloon, FNP ?Primary Gastroenterologist:  Dr. Abbey Chatters ? ?Chief Complaint  ?Patient presents with  ? Rectal Pain  ?  Pt here for rectal mass  ? ? ?HPI:   ?Terri Mckinney is a 41 y.o. female who presents to the clinic today by urgent referral from her PCP Evelina Dun for evaluation.  Patient notes to 3-monthhistory of diarrhea as well as left lower quadrant abdominal pain.  Pain moderate, intermittent, does not radiate. ? ? Was trialed on antibiotics which did not help her symptoms.  Underwent CT abdomen pelvis with contrast 03/08/22 which showed masslike thickening of the sigmoid colon/rectum with adenopathy findings concerning for colonic malignancy.  Liver also showed findings concerning for possible hepatic metastasis. ? ?Patient denies any family history of colorectal malignancy.  No melena, does note some recent rectal bleeding.  No weight loss. ? ?Past Medical History:  ?Diagnosis Date  ? Depression   ? ? ?No past surgical history on file. ? ?Current Outpatient Medications  ?Medication Sig Dispense Refill  ? calcium carbonate (OS-CAL) 1250 (500 Ca) MG chewable tablet Chew 1 tablet by mouth daily.    ? Cholecalciferol (VITAMIN D3) 30 MCG/15ML LIQD Take by mouth.    ? ibuprofen (ADVIL) 400 MG tablet Take 400 mg by mouth every 6 (six) hours as needed.    ? medroxyPROGESTERone (DEPO-PROVERA) 150 MG/ML injection INJECT 1 ML (150 MG TOTAL) INTO THE MUSCLE EVERY 3 (THREE) MONTHS 1 mL 2  ? Multiple Vitamin (MULTI-VITAMINS) TABS Take by mouth.    ? baclofen (LIORESAL) 10 MG tablet Take 1 tablet (10 mg total) by mouth 3 (three) times daily. (Patient not taking: Reported on 03/09/2022) 60 each 1  ? diclofenac (VOLTAREN) 75 MG EC tablet Take 1 tablet (75 mg total) by mouth 2 (two) times daily. (Patient not taking: Reported on 03/03/2022) 60 tablet 1  ? hydrocortisone (ANUSOL-HC) 25 MG suppository Place 1 suppository (25 mg total) rectally 2 (two) times daily. (Patient not taking:  Reported on 03/03/2022) 12 suppository 0  ? UNABLE TO FIND Med Name: dyocimin otc (Patient not taking: Reported on 03/03/2022)    ? ?No current facility-administered medications for this visit.  ? ? ?Allergies as of 03/09/2022 - Review Complete 03/03/2022  ?Allergen Reaction Noted  ? Wellbutrin [bupropion]  06/10/2018  ? ? ?Family History  ?Problem Relation Age of Onset  ? Heart disease Mother   ? Diabetes Mother   ? Hearing loss Father   ? Heart disease Father   ? Hyperlipidemia Father   ? Hypertension Father   ? Stroke Father   ? Arthritis Father   ? Diabetes Father   ? Learning disabilities Brother   ? Diabetes Maternal Grandmother   ? Heart disease Maternal Grandmother   ? Diabetes Maternal Grandfather   ? Diabetes Paternal Grandmother   ? Diabetes Paternal Grandfather   ? ? ?Social History  ? ?Socioeconomic History  ? Marital status: Married  ?  Spouse name: Not on file  ? Number of children: Not on file  ? Years of education: Not on file  ? Highest education level: Not on file  ?Occupational History  ? Not on file  ?Tobacco Use  ? Smoking status: Every Day  ?  Packs/day: 1.00  ?  Types: Cigarettes  ? Smokeless tobacco: Never  ?Vaping Use  ? Vaping Use: Never used  ?Substance and Sexual Activity  ? Alcohol use: No  ? Drug use: No  ?  Sexual activity: Yes  ?  Birth control/protection: Injection  ?Other Topics Concern  ? Not on file  ?Social History Narrative  ? Not on file  ? ?Social Determinants of Health  ? ?Financial Resource Strain: Not on file  ?Food Insecurity: Not on file  ?Transportation Needs: Not on file  ?Physical Activity: Not on file  ?Stress: Not on file  ?Social Connections: Not on file  ?Intimate Partner Violence: Not on file  ? ? ?Subjective: ?Review of Systems  ?Constitutional:  Negative for chills and fever.  ?HENT:  Negative for congestion and hearing loss.   ?Eyes:  Negative for blurred vision and double vision.  ?Respiratory:  Negative for cough and shortness of breath.   ?Cardiovascular:   Negative for chest pain and palpitations.  ?Gastrointestinal:  Positive for abdominal pain and diarrhea. Negative for blood in stool, constipation, heartburn, melena and vomiting.  ?Genitourinary:  Negative for dysuria and urgency.  ?Musculoskeletal:  Negative for joint pain and myalgias.  ?Skin:  Negative for itching and rash.  ?Neurological:  Negative for dizziness and headaches.  ?Psychiatric/Behavioral:  Negative for depression. The patient is not nervous/anxious.    ? ? ? ?Objective: ?BP 116/64   Pulse 92   Temp 97.7 ?F (36.5 ?C)   Ht '5\' 8"'$  (1.727 m)   Wt 139 lb 12.8 oz (63.4 kg)   BMI 21.26 kg/m?  ?Physical Exam ?Constitutional:   ?   Appearance: Normal appearance.  ?HENT:  ?   Head: Normocephalic and atraumatic.  ?Eyes:  ?   Extraocular Movements: Extraocular movements intact.  ?   Conjunctiva/sclera: Conjunctivae normal.  ?Cardiovascular:  ?   Rate and Rhythm: Normal rate and regular rhythm.  ?Pulmonary:  ?   Effort: Pulmonary effort is normal.  ?   Breath sounds: Normal breath sounds.  ?Abdominal:  ?   General: Bowel sounds are normal.  ?   Palpations: Abdomen is soft.  ?Musculoskeletal:     ?   General: No swelling. Normal range of motion.  ?   Cervical back: Normal range of motion and neck supple.  ?Skin: ?   General: Skin is warm and dry.  ?   Coloration: Skin is not jaundiced.  ?Neurological:  ?   General: No focal deficit present.  ?   Mental Status: She is alert and oriented to person, place, and time.  ?Psychiatric:     ?   Mood and Affect: Mood normal.     ?   Behavior: Behavior normal.  ? ? ? ?Assessment: ?*Sigmoid/Rectal mass ?*Diarrhea ?*Liver lesions ?*Abdominal pain-LLQ ? ?Plan: ?Discussed patient's CT in depth with her today.  We will schedule for colonoscopy as soon as possible to further evaluate.  I did counsel her that I am highly concerned for malignancy. ? ?The risks including infection, bleed, or perforation as well as benefits, limitations, alternatives and imponderables have been  reviewed with the patient. Questions have been answered. All parties agreeable. ? ?Discussed case with Dr. Raliegh Ip in regards to imaging of her liver lesions.  He recommended holding off on MRI until after colonoscopy at which point we can likely order PET scan if malignancy identified. ? ?Further recommendations to follow. ? ?03/09/2022 1:53 PM ? ? ?Disclaimer: This note was dictated with voice recognition software. Similar sounding words can inadvertently be transcribed and may not be corrected upon review. ? ?

## 2022-03-13 ENCOUNTER — Ambulatory Visit: Payer: BC Managed Care – PPO | Admitting: Gastroenterology

## 2022-03-15 ENCOUNTER — Inpatient Hospital Stay (HOSPITAL_COMMUNITY): Payer: BC Managed Care – PPO | Attending: Hematology | Admitting: Hematology

## 2022-03-15 ENCOUNTER — Inpatient Hospital Stay (HOSPITAL_COMMUNITY): Payer: BC Managed Care – PPO

## 2022-03-15 DIAGNOSIS — R932 Abnormal findings on diagnostic imaging of liver and biliary tract: Secondary | ICD-10-CM | POA: Diagnosis not present

## 2022-03-15 DIAGNOSIS — Z800 Family history of malignant neoplasm of digestive organs: Secondary | ICD-10-CM | POA: Insufficient documentation

## 2022-03-15 DIAGNOSIS — C20 Malignant neoplasm of rectum: Secondary | ICD-10-CM | POA: Insufficient documentation

## 2022-03-15 DIAGNOSIS — F17210 Nicotine dependence, cigarettes, uncomplicated: Secondary | ICD-10-CM | POA: Diagnosis not present

## 2022-03-15 DIAGNOSIS — D375 Neoplasm of uncertain behavior of rectum: Secondary | ICD-10-CM

## 2022-03-15 DIAGNOSIS — Z803 Family history of malignant neoplasm of breast: Secondary | ICD-10-CM | POA: Insufficient documentation

## 2022-03-15 DIAGNOSIS — K6289 Other specified diseases of anus and rectum: Secondary | ICD-10-CM | POA: Insufficient documentation

## 2022-03-15 DIAGNOSIS — M545 Low back pain, unspecified: Secondary | ICD-10-CM | POA: Diagnosis not present

## 2022-03-15 DIAGNOSIS — Z806 Family history of leukemia: Secondary | ICD-10-CM | POA: Diagnosis not present

## 2022-03-15 NOTE — Patient Instructions (Addendum)
[place] [facility] ?Discharge Instructions ? ?You were seen and examined today by Dr. Delton Coombes. Dr. Delton Coombes is a medical oncologist, meaning that he specializes in the treatment of cancer diagnoses. Dr. Delton Coombes discussed your past medical history, family history of cancers, and the events that led to you being here today. ? ?You were referred to Dr. Delton Coombes due to an abnormal CT scan. The CT scan revealed a potential mass in your colon, possibly rectum, area. There were also enlarged lymph nodes noted as well as some unclear areas on your liver. Please proceed with colonoscopy and biopsy as scheduled. ? ?Dr. Delton Coombes has recommended lab work today known as a tumor marker (CEA). This tumor marker is typically elevated in the presence of a colorectal cancer but it is not diagnostic. You will still need the biopsy regardless of the results. ? ?Dr. Delton Coombes has also recommended a PET scan. A PET scan is a whole body CT scan that illuminates where there is cancer present in the body. This will be helpful to identify if there are any concerns of cancer in the colon and accurately identify the stage of the cancer. ? ?Please follow-up with Dr. Raliegh Ip as scheduled following biopsy and PET scan. ? ? ?Thank you for choosing [place] [facility] to provide your oncology and hematology care.  To afford each patient quality time with our provider, please arrive at least 15 minutes before your scheduled appointment time.  ? ?If you have a lab appointment with the [facility] please come in thru the Main Entrance and check in at the main information desk. ? ?You need to re-schedule your appointment should you arrive 10 or more minutes late.  We strive to give you quality time with our providers, and arriving late affects you and other patients whose appointments are after yours.  Also, if you no show three or more times for appointments you may be dismissed from  the clinic at the providers discretion.     ?Again, thank you for choosing [facility].  Our hope is that these requests will decrease the amount of time that you wait before being seen by our physicians.       ?_____________________________________________________________ ? ?Should you have questions after your visit to [facility], please contact our office at [phone] and follow the prompts.  Our office hours are 8:00 a.m. and 4:30 p.m. Monday - Friday.  Please note that voicemails left after 4:00 p.m. may not be returned until the following business day.  We are closed weekends and major holidays.  You do have access to a nurse 24-7, just call the main number to the clinic [phone] and do not press any options, hold on the line and a nurse will answer the phone.   ? ?For prescription refill requests, have your pharmacy contact our office and allow 72 hours.   ? ?Due to Covid, you will need to wear a mask upon entering the hospital. If you do not have a mask, a mask will be given to you at the Main Entrance upon arrival. For doctor visits, patients may have 1 support person age 58 or older with them. For treatment visits, patients can not have anyone with them due to social distancing guidelines and our immunocompromised population.  ? ? ? ?

## 2022-03-15 NOTE — Progress Notes (Signed)
? ?[facility] ?[address] ?[place], [place] [address] ? ? ?CLINIC:  ?Medical Oncology/Hematology ? ?CONSULT NOTE ? ?Patient Care Team: ?Sharion Balloon, FNP as PCP - General (Family Medicine) ?Derek Jack, MD as Medical Oncologist (Medical Oncology) ?Brien Mates, RN as Oncology Nurse Navigator (Medical Oncology) ? ?CHIEF COMPLAINTS/PURPOSE OF CONSULTATION:  ?Evaluation of rectal mass ? ?HISTORY OF PRESENTING ILLNESS:  ?[name] 41 y.o. female is here because of evaluation of rectal mass, at the request of [facility]. ? ?Today she reports feeling well, and she is accompanied by her daughter. She reports severe diarrhea starting in February and sacral pain starting in March; she denies injury to her lower back. Her diarrhea has worsened and she now has blood and mucous in her stool. She reports 15 small watery BM in a day, and she feels she has not evacuated fully. She also reports pain in her right lateral back; she has been taking 1000 mg Ibuprofen every 6 hours for this pain which she has stopped recently for her upcoming colonoscopy. She has lost 50 lbs in the past 9 months; she reports she has been trying to lose weight through portion control and exercise. She denies nausea and vomiting. She had a Covid infection in July 2022 following which she reports she lost her taste and her appetite decreased. She denies tingling/numbness.  ? 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 She smokes 1 ppd and has smoked for 27 years. She denies alcohol consumption. Her paternal aunt had colon cancer, her maternal cousin had breast cancer, her maternal uncle and a maternal cousin passed from leukemia.  ? ?MEDICAL HISTORY:  ?Past Medical History:  ?Diagnosis Date  ? Depression   ? ? ?SURGICAL HISTORY: ?No past surgical history on file. ? ?SOCIAL HISTORY: ?Social History  ? ?Socioeconomic History  ? Marital status: Married  ?  Spouse name: Not on file  ? Number of children: Not on file  ? Years  of education: Not on file  ? Highest education level: Not on file  ?Occupational History  ? Not on file  ?Tobacco Use  ? Smoking status: Every Day  ?  Packs/day: 1.00  ?  Types: Cigarettes  ? Smokeless tobacco: Never  ?Vaping Use  ? Vaping Use: Never used  ?Substance and Sexual Activity  ? Alcohol use: No  ? Drug use: No  ? Sexual activity: Yes  ?  Birth control/protection: Injection  ?Other Topics Concern  ? Not on file  ?Social History Narrative  ? Not on file  ? ?Social Determinants of Health  ? ?Financial Resource Strain: Not on file  ?Food Insecurity: Not on file  ?Transportation Needs: Not on file  ?Physical Activity: Not on file  ?Stress: Not on file  ?Social Connections: Not on file  ?Intimate Partner Violence: Not on file  ? ? ?FAMILY HISTORY: ?Family History  ?Problem Relation Age of Onset  ? Heart disease Mother   ? Diabetes Mother   ? Hearing loss Father   ? Heart disease Father   ? Hyperlipidemia Father   ? Hypertension Father   ? Stroke Father   ? Arthritis Father   ? Diabetes Father   ? Learning disabilities Brother   ? Diabetes Maternal Grandmother   ? Heart disease Maternal Grandmother   ? Diabetes Maternal Grandfather   ? Diabetes Paternal Grandmother   ? Diabetes Paternal Grandfather   ? ? ?ALLERGIES:  ?is allergic to wellbutrin [bupropion]. ? ?MEDICATIONS:  ?  Current Outpatient Medications  ?Medication Sig Dispense Refill  ? baclofen (LIORESAL) 10 MG tablet Take 1 tablet (10 mg total) by mouth 3 (three) times daily. 60 each 1  ? calcium carbonate (OS-CAL) 1250 (500 Ca) MG chewable tablet Chew 1 tablet by mouth daily.    ? Cholecalciferol (VITAMIN D3) 30 MCG/15ML LIQD Take by mouth.    ? diclofenac (VOLTAREN) 75 MG EC tablet Take 1 tablet (75 mg total) by mouth 2 (two) times daily. 60 tablet 1  ? hydrocortisone (ANUSOL-HC) 25 MG suppository Place 1 suppository (25 mg total) rectally 2 (two) times daily. 12 suppository 0  ? ibuprofen (ADVIL) 400 MG tablet Take 400 mg by mouth every 6 (six) hours as  needed.    ? medroxyPROGESTERone (DEPO-PROVERA) 150 MG/ML injection INJECT 1 ML (150 MG TOTAL) INTO THE MUSCLE EVERY 3 (THREE) MONTHS 1 mL 2  ? Multiple Vitamin (MULTI-VITAMINS) TABS Take by mouth.    ? polyethylene glycol-electrolytes (TRILYTE) 420 g solution Take 4,000 mLs by mouth as directed. 4000 mL 0  ? UNABLE TO FIND Med Name: dyocimin otc    ? ?No current facility-administered medications for this visit.  ? ? ?REVIEW OF SYSTEMS:   ?Review of Systems  ?Constitutional:  Positive for appetite change and unexpected weight change (-50 lbs). Negative for fatigue.  ?Gastrointestinal:  Positive for blood in stool and diarrhea. Negative for nausea and vomiting.  ?Musculoskeletal:  Positive for back pain (10/10).  ?Psychiatric/Behavioral:  Positive for sleep disturbance.   ?All other systems reviewed and are negative. ? ? ?PHYSICAL EXAMINATION: ?ECOG PERFORMANCE STATUS: 0 - Asymptomatic ? ?Vitals:  ? 03/15/22 0813  ?BP: 111/74  ?Pulse: 85  ?Resp: 18  ?Temp: 98.6 ?F (37 ?C)  ?SpO2: 96%  ? ?Filed Weights  ? 03/15/22 0813  ?Weight: 139 lb 7 oz (63.2 kg)  ? ?Physical Exam ?Vitals reviewed.  ?Constitutional:   ?   Appearance: Normal appearance.  ?Cardiovascular:  ?   Rate and Rhythm: Normal rate and regular rhythm.  ?   Pulses: Normal pulses.  ?   Heart sounds: Normal heart sounds.  ?Pulmonary:  ?   Effort: Pulmonary effort is normal.  ?   Breath sounds: Normal breath sounds.  ?Abdominal:  ?   Palpations: Abdomen is soft. There is no hepatomegaly, splenomegaly or mass.  ?   Tenderness: There is no abdominal tenderness.  ?Musculoskeletal:  ?   Right hip: Bony tenderness (sacroilliac) present.  ?   Right lower leg: No edema.  ?   Left lower leg: No edema.  ?Lymphadenopathy:  ?   Cervical: No cervical adenopathy.  ?   Right cervical: No superficial cervical adenopathy. ?   Left cervical: No superficial cervical adenopathy.  ?   Upper Body:  ?   Right upper body: No supraclavicular or axillary adenopathy.  ?   Left upper body:  No supraclavicular or axillary adenopathy.  ?   Lower Body: No right inguinal adenopathy. No left inguinal adenopathy.  ?Neurological:  ?   General: No focal deficit present.  ?   Mental Status: She is alert and oriented to person, place, and time.  ?Psychiatric:     ?   Mood and Affect: Mood normal.     ?   Behavior: Behavior normal.  ? ? ? ?LABORATORY DATA:  ?I have reviewed the data as listed ? ?  Latest Ref Rng & Units 03/03/2022  ?  4:18 PM 01/27/2022  ?  4:08 PM 08/22/2021  ?  4:08 PM  ?CBC  ?WBC 3.4 - 10.8 x10E3/uL 11.0   13.3   11.2    ?Hemoglobin 11.1 - 15.9 g/dL 12.3   12.8   14.2    ?Hematocrit 34.0 - 46.6 % 35.4   37.7   41.1    ?Platelets 150 - 450 x10E3/uL 351   370   309    ? ? ?  Latest Ref Rng & Units 03/03/2022  ?  4:18 PM 01/27/2022  ?  4:08 PM 08/22/2021  ?  4:08 PM  ?CMP  ?Glucose 70 - 99 mg/dL 61   77   72    ?BUN 6 - 24 mg/dL '10   8   10    '$ ?Creatinine 0.57 - 1.00 mg/dL 0.75   0.68   0.81    ?Sodium 134 - 144 mmol/L 144   141   140    ?Potassium 3.5 - 5.2 mmol/L 4.5   4.5   4.1    ?Chloride 96 - 106 mmol/L 111   106   104    ?CO2 20 - 29 mmol/L '21   21   20    '$ ?Calcium 8.7 - 10.2 mg/dL 8.8   9.4   9.5    ?Total Protein 6.0 - 8.5 g/dL 5.8   6.4   6.3    ?Total Bilirubin 0.0 - 1.2 mg/dL <0.2   <0.2   0.4    ?Alkaline Phos 44 - 121 IU/L 49   57   59    ?AST 0 - 40 IU/L '9   10   11    '$ ?ALT 0 - 32 IU/L '9   13   10    '$ ? ? ?RADIOGRAPHIC STUDIES: ?I have personally reviewed the radiological images as listed and agreed with the findings in the report. ?CT Abdomen Pelvis W Contrast ? ?Result Date: 03/08/2022 ?CLINICAL DATA:  Nonlocalized abdominal pain.  Diarrhea. EXAM: CT ABDOMEN AND PELVIS WITH CONTRAST TECHNIQUE: Multidetector CT imaging of the abdomen and pelvis was performed using the standard protocol following bolus administration of intravenous contrast. RADIATION DOSE REDUCTION: This exam was performed according to the departmental dose-optimization program which includes automated exposure control,  adjustment of the mA and/or kV according to patient size and/or use of iterative reconstruction technique. CONTRAST:  98m OMNIPAQUE IOHEXOL 300 MG/ML  SOLN COMPARISON:  None. FINDINGS: Lower chest: N

## 2022-03-16 LAB — CEA: CEA: 13.4 ng/mL — ABNORMAL HIGH (ref 0.0–4.7)

## 2022-03-17 ENCOUNTER — Other Ambulatory Visit (HOSPITAL_COMMUNITY)
Admission: RE | Admit: 2022-03-17 | Discharge: 2022-03-17 | Disposition: A | Discharge status: Home / Self Care | Payer: BC Managed Care – PPO | Source: Ambulatory Visit | Attending: Internal Medicine | Admitting: Internal Medicine

## 2022-03-17 DIAGNOSIS — K6289 Other specified diseases of anus and rectum: Secondary | ICD-10-CM | POA: Insufficient documentation

## 2022-03-17 LAB — PREGNANCY, URINE: Preg Test, Ur: NEGATIVE

## 2022-03-21 ENCOUNTER — Other Ambulatory Visit: Payer: Self-pay

## 2022-03-21 ENCOUNTER — Ambulatory Visit (HOSPITAL_COMMUNITY): Payer: BC Managed Care – PPO | Admitting: Certified Registered Nurse Anesthetist

## 2022-03-21 ENCOUNTER — Encounter (HOSPITAL_COMMUNITY)
Admission: RE | Disposition: A | Discharge status: Home / Self Care | Payer: Self-pay | Source: Home / Self Care | Attending: Internal Medicine

## 2022-03-21 ENCOUNTER — Ambulatory Visit (HOSPITAL_COMMUNITY)
Admission: RE | Admit: 2022-03-21 | Discharge: 2022-03-21 | Disposition: A | Discharge status: Home / Self Care | Payer: BC Managed Care – PPO | Attending: Internal Medicine | Admitting: Internal Medicine

## 2022-03-21 ENCOUNTER — Encounter (HOSPITAL_COMMUNITY): Payer: Self-pay

## 2022-03-21 DIAGNOSIS — R948 Abnormal results of function studies of other organs and systems: Secondary | ICD-10-CM | POA: Insufficient documentation

## 2022-03-21 DIAGNOSIS — K529 Noninfective gastroenteritis and colitis, unspecified: Secondary | ICD-10-CM | POA: Insufficient documentation

## 2022-03-21 DIAGNOSIS — K6289 Other specified diseases of anus and rectum: Secondary | ICD-10-CM | POA: Insufficient documentation

## 2022-03-21 DIAGNOSIS — F17210 Nicotine dependence, cigarettes, uncomplicated: Secondary | ICD-10-CM | POA: Insufficient documentation

## 2022-03-21 DIAGNOSIS — D128 Benign neoplasm of rectum: Secondary | ICD-10-CM | POA: Diagnosis not present

## 2022-03-21 DIAGNOSIS — C19 Malignant neoplasm of rectosigmoid junction: Secondary | ICD-10-CM | POA: Insufficient documentation

## 2022-03-21 DIAGNOSIS — C20 Malignant neoplasm of rectum: Secondary | ICD-10-CM | POA: Diagnosis not present

## 2022-03-21 DIAGNOSIS — K56691 Other complete intestinal obstruction: Secondary | ICD-10-CM | POA: Insufficient documentation

## 2022-03-21 DIAGNOSIS — K626 Ulcer of anus and rectum: Secondary | ICD-10-CM | POA: Diagnosis not present

## 2022-03-21 HISTORY — PX: COLONOSCOPY WITH PROPOFOL: SHX5780

## 2022-03-21 HISTORY — PX: BIOPSY: SHX5522

## 2022-03-21 SURGERY — COLONOSCOPY WITH PROPOFOL
Anesthesia: General

## 2022-03-21 MED ORDER — PROPOFOL 10 MG/ML IV BOLUS
INTRAVENOUS | Status: DC | PRN
  Administered 2022-03-21: 40 mg via INTRAVENOUS
  Administered 2022-03-21: 20 mg via INTRAVENOUS
  Administered 2022-03-21 (×2): 30 mg via INTRAVENOUS
  Administered 2022-03-21: 100 mg via INTRAVENOUS

## 2022-03-21 MED ORDER — LIDOCAINE HCL (CARDIAC) PF 100 MG/5ML IV SOSY
PREFILLED_SYRINGE | INTRAVENOUS | Status: DC | PRN
  Administered 2022-03-21: 50 mg via INTRAVENOUS

## 2022-03-21 MED ORDER — LACTATED RINGERS IV SOLN: INTRAVENOUS | Status: DC

## 2022-03-21 NOTE — Transfer of Care (Signed)
Immediate Anesthesia Transfer of Care Note ? ?Patient: Terri Mckinney ? ?Procedure(s) Performed: COLONOSCOPY WITH PROPOFOL ?BIOPSY ? ?Patient Location: Endoscopy Unit ? ?Anesthesia Type:General ? ?Level of Consciousness: drowsy ? ?Airway & Oxygen Therapy: Patient Spontanous Breathing ? ?Post-op Assessment: Report given to RN and Post -op Vital signs reviewed and stable ? ?Post vital signs: Reviewed and stable ? ?Last Vitals:  ?Vitals Value Taken Time  ?BP 100/58   ?Temp    ?Pulse 81   ?Resp 22   ?SpO2 97%   ? ? ?Last Pain:  ?Vitals:  ? 03/21/22 0749  ?TempSrc:   ?PainSc: 0-No pain  ?   ? ?Patients Stated Pain Goal: 6 (03/21/22 1165) ? ?Complications: No notable events documented. ?

## 2022-03-21 NOTE — Interval H&P Note (Signed)
History and Physical Interval Note: ? ?03/21/2022 ?7:41 AM ? ?Terri Mckinney  has presented today for surgery, with the diagnosis of rectal mass.  The various methods of treatment have been discussed with the patient and family. After consideration of risks, benefits and other options for treatment, the patient has consented to  Procedure(s) with comments: ?COLONOSCOPY WITH PROPOFOL (N/A) - 8:30am as a surgical intervention.  The patient's history has been reviewed, patient examined, no change in status, stable for surgery.  I have reviewed the patient's chart and labs.  Questions were answered to the patient's satisfaction.   ? ? ?[name] ? ? ?

## 2022-03-21 NOTE — Discharge Instructions (Addendum)
?  Colonoscopy ?Discharge Instructions ? ?Read the instructions outlined below and refer to this sheet in the next few weeks. These discharge instructions provide you with general information on caring for yourself after you leave the hospital. Your doctor may also give you specific instructions. While your treatment has been planned according to the most current medical practices available, unavoidable complications occasionally occur.  ? ?ACTIVITY ?You may resume your regular activity, but move at a slower pace for the next 24 hours.  ?Take frequent rest periods for the next 24 hours.  ?Walking will help get rid of the air and reduce the bloated feeling in your belly (abdomen).  ?No driving for 24 hours (because of the medicine (anesthesia) used during the test).   ?Do not sign any important legal documents or operate any machinery for 24 hours (because of the anesthesia used during the test).  ?NUTRITION ?Drink plenty of fluids.  ?You may resume your normal diet as instructed by your doctor.  ?Begin with a light meal and progress to your normal diet. Heavy or fried foods are harder to digest and may make you feel sick to your stomach (nauseated).  ?Avoid alcoholic beverages for 24 hours or as instructed.  ?MEDICATIONS ?You may resume your normal medications unless your doctor tells you otherwise.  ?WHAT YOU CAN EXPECT TODAY ?Some feelings of bloating in the abdomen.  ?Passage of more gas than usual.  ?Spotting of blood in your stool or on the toilet paper.  ?IF YOU HAD POLYPS REMOVED DURING THE COLONOSCOPY: ?No aspirin products for 7 days or as instructed.  ?No alcohol for 7 days or as instructed.  ?Eat a soft diet for the next 24 hours.  ?FINDING OUT THE RESULTS OF YOUR TEST ?Not all test results are available during your visit. If your test results are not back during the visit, make an appointment with your caregiver to find out the results. Do not assume everything is normal if you have not heard from your  caregiver or the medical facility. It is important for you to follow up on all of your test results.  ?SEEK IMMEDIATE MEDICAL ATTENTION IF: ?You have more than a spotting of blood in your stool.  ?Your belly is swollen (abdominal distention).  ?You are nauseated or vomiting.  ?You have a temperature over 101.  ?You have abdominal pain or discomfort that is severe or gets worse throughout the day.  ? ?Your colonoscopy revealed a mass at the junction of your rectum and sigmoid colon.  Unfortunately I am concerned that this is a malignant/cancerous mass.  I took extensive biopsies of the area.  Await pathology results, I will contact you in 1 to 2 days when I have these.  The mass is nearly obstructing your colon which is likely why you are having so much diarrhea.  Would keep your diet soft.  Recommend MiraLAX twice daily.  I will discuss your case with surgery.  Follow-up with Dr. Raliegh Ip of oncology. ? ?I hope you have a great rest of your week! ? ?Elon Alas. Abbey Chatters, D.O. ?Gastroenterology and Hepatology ?[facility] ? ?

## 2022-03-21 NOTE — Anesthesia Preprocedure Evaluation (Signed)
Anesthesia Evaluation  ?Patient identified by MRN, date of birth, ID band ?Patient awake ? ? ? ?Reviewed: ?Allergy & Precautions, H&P , NPO status , Patient's Chart, lab work & pertinent test results, reviewed documented beta blocker date and time  ? ?Airway ?Mallampati: II ? ?TM Distance: >3 FB ?Neck ROM: full ? ? ? Dental ?no notable dental hx. ? ?  ?Pulmonary ?neg pulmonary ROS, Current Smoker,  ?  ?Pulmonary exam normal ?breath sounds clear to auscultation ? ? ? ? ? ? Cardiovascular ?Exercise Tolerance: Good ?negative cardio ROS ? ? ?Rhythm:regular Rate:Normal ? ? ?  ?Neuro/Psych ?PSYCHIATRIC DISORDERS Depression negative neurological ROS ?   ? GI/Hepatic ?negative GI ROS, Neg liver ROS,   ?Endo/Other  ?negative endocrine ROS ? Renal/GU ?negative Renal ROS  ?negative genitourinary ?  ?Musculoskeletal ? ? Abdominal ?  ?Peds ? Hematology ?negative hematology ROS ?(+)   ?Anesthesia Other Findings ? ? Reproductive/Obstetrics ?negative OB ROS ? ?  ? ? ? ? ? ? ? ? ? ? ? ? ? ?  ?  ? ? ? ? ? ? ? ? ?Anesthesia Physical ?Anesthesia Plan ? ?ASA: 2 ? ?Anesthesia Plan: General  ? ?Post-op Pain Management:   ? ?Induction:  ? ?PONV Risk Score and Plan: Propofol infusion ? ?Airway Management Planned:  ? ?Additional Equipment:  ? ?Intra-op Plan:  ? ?Post-operative Plan:  ? ?Informed Consent: I have reviewed the patients History and Physical, chart, labs and discussed the procedure including the risks, benefits and alternatives for the proposed anesthesia with the patient or authorized representative who has indicated his/her understanding and acceptance.  ? ? ? ?Dental Advisory Given ? ?Plan Discussed with: CRNA ? ?Anesthesia Plan Comments:   ? ? ? ? ? ? ?Anesthesia Quick Evaluation ? ?

## 2022-03-21 NOTE — Anesthesia Postprocedure Evaluation (Signed)
Anesthesia Post Note ? ?Patient: Nazly Digilio ? ?Procedure(s) Performed: COLONOSCOPY WITH PROPOFOL ?BIOPSY ? ?Patient location during evaluation: Phase II ?Anesthesia Type: General ?Level of consciousness: awake ?Pain management: pain level controlled ?Vital Signs Assessment: post-procedure vital signs reviewed and stable ?Respiratory status: spontaneous breathing and respiratory function stable ?Cardiovascular status: blood pressure returned to baseline and stable ?Postop Assessment: no headache and no apparent nausea or vomiting ?Anesthetic complications: no ?Comments: Late entry ? ? ?No notable events documented. ? ? ?Last Vitals:  ?Vitals:  ? 03/21/22 0712 03/21/22 0807  ?BP: 115/64 (!) 100/58  ?Pulse:  81  ?Resp: 19 20  ?Temp: 36.9 ?C 36.8 ?C  ?SpO2: 100% 98%  ?  ?Last Pain:  ?Vitals:  ? 03/21/22 0807  ?TempSrc: Oral  ?PainSc: 0-No pain  ? ? ?  ?  ?  ?  ?  ?  ? ?Louann Sjogren ? ? ? ? ?

## 2022-03-21 NOTE — Op Note (Signed)
[facility] ?Patient Name: Terri Mckinney ?Procedure Date: 03/21/2022 7:23 AM ?MRN: [id number] ?Date of Birth: 03/14/1981 ?Attending MD: Elon Alas. Abbey Chatters , DO ?CSN: [id number] ?Age: 41 ?Admit Type: Outpatient ?Procedure:                Colonoscopy ?Indications:              Abdominal pain in the left lower quadrant, Chronic  ?                          diarrhea, Abnormal CT of the GI tract ?Providers:                Elon Alas. Abbey Chatters, DO, Caprice Kluver, Randa Spike,  ?                          Technician ?Referring MD:              ?Medicines:                See the Anesthesia note for documentation of the  ?                          administered medications ?Complications:            No immediate complications. ?Estimated Blood Loss:     Estimated blood loss was minimal. ?Procedure:                Pre-Anesthesia Assessment: ?                          - The anesthesia plan was to use monitored  ?                          anesthesia care (MAC). ?                          After obtaining informed consent, the colonoscope  ?                          was passed under direct vision. Throughout the  ?                          procedure, the patient's blood pressure, pulse, and  ?                          oxygen saturations were monitored continuously. The  ?                          PCF-HQ190L ([id number]) scope was introduced through  ?                          the anus with the intention of advancing to the  ?                          cecum. The scope was advanced to the sigmoid colon  ?                          before  the procedure was aborted. Medications were  ?                          given. The colonoscopy was performed without  ?                          difficulty. The patient tolerated the procedure  ?                          well. The quality of the bowel preparation was  ?                          evaluated using the BBPS [facility] Bowel Preparation  ?                          Scale) with scores of: Left  Colon = 1 (portion of  ?                          mucosa seen, but other areas not well seen due to  ?                          staining, residual stool and/or opaque liquid). The  ?                          total BBPS score equals 1. The quality of the bowel  ?                          preparation was inadequate. ?Scope In: 7:53:36 AM ?Scope Out: 8:03:51 AM ?Total Procedure Duration: 0 hours 10 minutes 15 seconds  ?Findings: ?     The digital rectal exam revealed a rectal mass. ?     A frond-like/villous, fungating and infiltrative nearly completely  ?     obstructing mass was found in the recto-sigmoid colon. The mass was  ?     circumferential. The mass measured four cm in length. No bleeding was  ?     present. Biopsies were taken with a cold forceps for histology. I was  ?     able to advance pediatric colonoscope through lesion, though barely.  ?     Colon prep proximal to mass was poor. Unable to advance colonoscope  ?     further then sigmoid colon due to poor visability Procedure was then  ?     aborted. ?Impression:               - Preparation of the colon was inadequate. ?                          - Rectal mass. ?                          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 Biopsied. ?Moderate Sedation: ?     Per Anesthesia Care ?Recommendation:           -  Patient has a contact number available for  ?                          emergencies. The signs and symptoms of potential  ?                          delayed complications were discussed with the  ?                          patient. Return to normal activities tomorrow.  ?                          Written discharge instructions were provided to the  ?                          patient. ?                          - Soft diet. ?                          - Miralax 1 capful (17 grams) in 8 ounces of water  ?                          PO BID. ?                          - Follow up with Oncology. Will  discuss case with  ?                          surgery given nearly obstructive nature of mass.  ?                          May need to see colorectal surgery in [place]. ?Procedure Code(s):        --- Professional --- ?                          62703, 67, Colonoscopy, flexible; with biopsy,  ?                          single or multiple ?Diagnosis Code(s):        --- Professional --- ?                          K62.89, Other specified diseases of anus and rectum ?                          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                          K56.691, Other complete intestinal obstruction ?                          R10.32, Left lower quadrant pain ?  K52.9, Noninfective gastroenteritis and colitis,  ?                          unspecified ?                          R93.3, Abnormal findings on diagnostic imaging of  ?                          other parts of digestive tract ?CPT copyright 2019 American Medical Association. All rights reserved. ?The codes documented in this report are preliminary and upon coder review may  ?be revised to meet current compliance requirements. ?Elon Alas. Abbey Chatters, DO ?Elon Alas. Garber, DO ?03/21/2022 8:12:11 AM ?This report has been signed electronically. ?Number of Addenda: 0 ?

## 2022-03-22 ENCOUNTER — Ambulatory Visit: Payer: BC Managed Care – PPO | Admitting: Gastroenterology

## 2022-03-22 LAB — SURGICAL PATHOLOGY

## 2022-03-23 ENCOUNTER — Other Ambulatory Visit: Payer: Self-pay

## 2022-03-23 ENCOUNTER — Ambulatory Visit (HOSPITAL_COMMUNITY)
Admission: RE | Admit: 2022-03-23 | Discharge: 2022-03-23 | Disposition: A | Discharge status: Home / Self Care | Payer: BC Managed Care – PPO | Source: Ambulatory Visit | Attending: Hematology | Admitting: Hematology

## 2022-03-23 ENCOUNTER — Encounter (HOSPITAL_COMMUNITY): Payer: Self-pay | Admitting: Internal Medicine

## 2022-03-23 DIAGNOSIS — K6289 Other specified diseases of anus and rectum: Secondary | ICD-10-CM | POA: Diagnosis not present

## 2022-03-23 DIAGNOSIS — C20 Malignant neoplasm of rectum: Secondary | ICD-10-CM | POA: Diagnosis not present

## 2022-03-23 DIAGNOSIS — K769 Liver disease, unspecified: Secondary | ICD-10-CM | POA: Diagnosis not present

## 2022-03-23 DIAGNOSIS — J329 Chronic sinusitis, unspecified: Secondary | ICD-10-CM | POA: Diagnosis not present

## 2022-03-23 DIAGNOSIS — D739 Disease of spleen, unspecified: Secondary | ICD-10-CM | POA: Diagnosis not present

## 2022-03-23 MED ORDER — FLUDEOXYGLUCOSE F - 18 (FDG) INJECTION
6.5900 | Freq: Once | INTRAVENOUS | Status: AC | PRN
  Administered 2022-03-23: 6.59 via INTRAVENOUS

## 2022-03-28 ENCOUNTER — Inpatient Hospital Stay (HOSPITAL_COMMUNITY): Payer: BC Managed Care – PPO | Admitting: Hematology

## 2022-03-28 DIAGNOSIS — C20 Malignant neoplasm of rectum: Secondary | ICD-10-CM

## 2022-03-28 DIAGNOSIS — K6289 Other specified diseases of anus and rectum: Secondary | ICD-10-CM

## 2022-03-28 NOTE — Progress Notes (Signed)
? ?[facility] ?[address] ?[place], [place] [address] ? ? ?CLINIC:  ?Medical Oncology/Hematology ? ?PCP:  ?Sharion Balloon, FNP ?[address] / [place] [address] ?[phone] ? ? ?REASON FOR VISIT:  ?Follow-up for rectal mass ? ?PRIOR THERAPY: none ? ?NGS Results: not done ? ?CURRENT THERAPY: under work-up ? ?BRIEF ONCOLOGIC HISTORY:  ?Oncology History  ? No history exists.  ? ? ?CANCER STAGING: ? Cancer Staging  ?Rectal cancer ([place]) ?Staging form: Colon and Rectum, AJCC 8th Edition ?- Clinical stage from 03/28/2022: Stage IVA (cTX, cN1, cM1a) - Unsigned ? ? ?INTERVAL HISTORY:  ?[name], a 41 y.o. female, returns for routine follow-up of her rectal mass. Terri Mckinney was last seen on 03/15/2022.  ? ?Today she reports feeling good. She is scheduled to meet with Dr. Okey Dupre on 5/2. She reports soft stools 1-2 times daily. She reports lower back pain while sitting; this pain is not radiating down her legs. She is taking 800 mg every 6 hours. She denies abdominal pain.  ? ?REVIEW OF SYSTEMS:  ?Review of Systems  ?Constitutional:  Negative for appetite change and fatigue.  ?Gastrointestinal:  Negative for abdominal pain, constipation and diarrhea.  ?Musculoskeletal:  Positive for back pain (6/10 lower).  ?All other systems reviewed and are negative. ? ?PAST MEDICAL/SURGICAL HISTORY:  ?Past Medical History:  ?Diagnosis Date  ? Depression   ? ?Past Surgical History:  ?Procedure Laterality Date  ? BIOPSY  03/21/2022  ? Procedure: BIOPSY;  Surgeon: [name], DO;  [facility]: [facility];  Service: Endoscopy;;  ? COLONOSCOPY WITH PROPOFOL N/A 03/21/2022  ? Procedure: COLONOSCOPY WITH PROPOFOL;  Surgeon: [name], DO;  Location: [facility];  Service: Endoscopy;  Laterality: N/A;  8:30am  ? ? ?SOCIAL HISTORY:  ?Social History  ? ?Socioeconomic History  ? Marital status: Married  ?  Spouse name: Not on file  ? Number of children: Not on file  ? Years of education: Not on  file  ? Highest education level: Not on file  ?Occupational History  ? Not on file  ?Tobacco Use  ? Smoking status: Every Day  ?  Packs/day: 1.00  ?  Types: Cigarettes  ? Smokeless tobacco: Never  ?Vaping Use  ? Vaping Use: Never used  ?Substance and Sexual Activity  ? Alcohol use: No  ? Drug use: No  ? Sexual activity: Yes  ?  Birth control/protection: Injection  ?Other Topics Concern  ? Not on file  ?Social History Narrative  ? Not on file  ? ?Social Determinants of Health  ? ?Financial Resource Strain: Not on file  ?Food Insecurity: Not on file  ?Transportation Needs: Not on file  ?Physical Activity: Not on file  ?Stress: Not on file  ?Social Connections: Not on file  ?Intimate Partner Violence: Not on file  ? ? ?FAMILY HISTORY:  ?Family History  ?Problem Relation Age of Onset  ? Heart disease Mother   ? Diabetes Mother   ? Hearing loss Father   ? Heart disease Father   ? Hyperlipidemia Father   ? Hypertension Father   ? Stroke Father   ? Arthritis Father   ? Diabetes Father   ? Learning disabilities Brother   ? Diabetes Maternal Grandmother   ? Heart disease Maternal Grandmother   ? Diabetes Maternal Grandfather   ? Diabetes Paternal Grandmother   ? Diabetes Paternal Grandfather   ? ? ?CURRENT MEDICATIONS:  ?Current Outpatient Medications  ?Medication Sig Dispense  Refill  ? calcium carbonate (OS-CAL) 1250 (500 Ca) MG chewable tablet Chew 1 tablet by mouth daily.    ? Cholecalciferol (VITAMIN D3) 30 MCG/15ML LIQD Take by mouth.    ? diclofenac (VOLTAREN) 75 MG EC tablet Take 1 tablet (75 mg total) by mouth 2 (two) times daily. 60 tablet 1  ? hydrocortisone (ANUSOL-HC) 25 MG suppository Place 1 suppository (25 mg total) rectally 2 (two) times daily. 12 suppository 0  ? ibuprofen (ADVIL) 400 MG tablet Take 400 mg by mouth every 6 (six) hours as needed.    ? medroxyPROGESTERone (DEPO-PROVERA) 150 MG/ML injection INJECT 1 ML (150 MG TOTAL) INTO THE MUSCLE EVERY 3 (THREE) MONTHS 1 mL 2  ? Multiple Vitamin  (MULTI-VITAMINS) TABS Take by mouth.    ? ?No current facility-administered medications for this visit.  ? ? ?ALLERGIES:  ?Allergies  ?Allergen Reactions  ? Wellbutrin [Bupropion]   ?  insomnia  ? ? ?PHYSICAL EXAM:  ?Performance status (ECOG): 0 - Asymptomatic ? ?There were no vitals filed for this visit. ?Wt Readings from Last 3 Encounters:  ?03/21/22 139 lb (63 kg)  ?03/15/22 139 lb 7 oz (63.2 kg)  ?03/09/22 139 lb 12.8 oz (63.4 kg)  ? ?Physical Exam ?Vitals reviewed.  ?Constitutional:   ?   Appearance: Normal appearance.  ?Cardiovascular:  ?   Rate and Rhythm: Normal rate and regular rhythm.  ?   Pulses: Normal pulses.  ?   Heart sounds: Normal heart sounds.  ?Pulmonary:  ?   Effort: Pulmonary effort is normal.  ?   Breath sounds: Normal breath sounds.  ?Neurological:  ?   General: No focal deficit present.  ?   Mental Status: She is alert and oriented to person, place, and time.  ?Psychiatric:     ?   Mood and Affect: Mood normal.     ?   Behavior: Behavior normal.  ?  ? ?LABORATORY DATA:  ?I have reviewed the labs as listed.  ? ?  Latest Ref Rng & Units 03/03/2022  ?  4:18 PM 01/27/2022  ?  4:08 PM 08/22/2021  ?  4:08 PM  ?CBC  ?WBC 3.4 - 10.8 x10E3/uL 11.0   13.3   11.2    ?Hemoglobin 11.1 - 15.9 g/dL 12.3   12.8   14.2    ?Hematocrit 34.0 - 46.6 % 35.4   37.7   41.1    ?Platelets 150 - 450 x10E3/uL 351   370   309    ? ? ?  Latest Ref Rng & Units 03/03/2022  ?  4:18 PM 01/27/2022  ?  4:08 PM 08/22/2021  ?  4:08 PM  ?CMP  ?Glucose 70 - 99 mg/dL 61   77   72    ?BUN 6 - 24 mg/dL '10   8   10    '$ ?Creatinine 0.57 - 1.00 mg/dL 0.75   0.68   0.81    ?Sodium 134 - 144 mmol/L 144   141   140    ?Potassium 3.5 - 5.2 mmol/L 4.5   4.5   4.1    ?Chloride 96 - 106 mmol/L 111   106   104    ?CO2 20 - 29 mmol/L '21   21   20    '$ ?Calcium 8.7 - 10.2 mg/dL 8.8   9.4   9.5    ?Total Protein 6.0 - 8.5 g/dL 5.8   6.4   6.3    ?Total Bilirubin 0.0 - 1.2  mg/dL <0.2   <0.2   0.4    ?Alkaline Phos 44 - 121 IU/L 49   57   59    ?AST 0 - 40  IU/L '9   10   11    '$ ?ALT 0 - 32 IU/L '9   13   10    '$ ? ? ?DIAGNOSTIC IMAGING:  ?I have independently reviewed the scans and discussed with the patient. ?CT Abdomen Pelvis W Contrast ? ?Result Date: 03/08/2022 ?CLINICAL DATA:  Nonlocalized abdominal pain.  Diarrhea. EXAM: CT ABDOMEN AND PELVIS WITH CONTRAST TECHNIQUE: Multidetector CT imaging of the abdomen and pelvis was performed using the standard protocol following bolus administration of intravenous contrast. RADIATION DOSE REDUCTION: This exam was performed according to the departmental dose-optimization program which includes automated exposure control, adjustment of the mA and/or kV according to patient size and/or use of iterative reconstruction technique. CONTRAST:  60m OMNIPAQUE IOHEXOL 300 MG/ML  SOLN COMPARISON:  None. FINDINGS: Lower chest: Normal heart size. Dependent atelectasis bilateral lower lobes. No pleural effusion. Hepatobiliary: The liver is normal in size and contour. Scattered subcentimeter too small to characterize low-attenuation lesions within the right and left hepatic lobes including reference lesion (image 12; series 2) in the right hepatic lobe and reference lesion (image 14; series 2) in the left hepatic lobe. Additionally within the central aspect of the liver there is a subtle hypoenhancing 2.9 x 2.6 cm lesion (image 15; series 2). Gallbladder is unremarkable. Pancreas: Unremarkable Spleen: Calcified granulomas in the spleen. Adrenals/Urinary Tract: Normal adrenal glands. Kidneys enhance symmetrically with contrast. No hydronephrosis. Urinary bladder is unremarkable. Stomach/Bowel: There is focal narrowing with associated circumferential wall thickening and irregularity of the sigmoid colon/rectum (image 21; series 5) (image 76; series 2). There are multiple adjacent perirectal lymph nodes measuring up to 9 mm (image 77; series 2). Large amount of stool throughout the upstream colon. No evidence for small bowel obstruction.  Vascular/Lymphatic: Normal caliber abdominal aorta. No retroperitoneal lymphadenopathy. Reproductive: Uterus and bilateral adnexa are unremarkable. Other: No abdominal wall hernia or abnormality. No abdominopelvic ascites. Mus

## 2022-03-28 NOTE — Patient Instructions (Signed)
[place] [facility] ?Discharge Instructions ? ? ?You were seen and examined today by Dr. Delton Coombes. ? ?He reviewed the results of your PET scan, which showed nonspecific spots on your liver.  We will arrange for you to have an MRI of your abdomen to inspect these spots more closely. If these spots end up being cancer, this would make this a Stage IV disease. If this is a Stage III, the plan will be to treat you with radiation and chemotherapy, then surgery.  ? ?Return as scheduled after MRI.  ? ? ?Thank you for choosing [facility] at [facility] to provide your oncology and hematology care.  To afford each patient quality time with our provider, please arrive at least 15 minutes before your scheduled appointment time.  ? ?If you have a lab appointment with the [facility] please come in thru the Main Entrance and check in at the main information desk. ? ?You need to re-schedule your appointment should you arrive 10 or more minutes late.  We strive to give you quality time with our providers, and arriving late affects you and other patients whose appointments are after yours.  Also, if you no show three or more times for appointments you may be dismissed from the clinic at the providers discretion.     ?Again, thank you for choosing [facility].  Our hope is that these requests will decrease the amount of time that you wait before being seen by our physicians.       ?_____________________________________________________________ ? ?Should you have questions after your visit to [facility], please contact our office at [phone] and follow the prompts.  Our office hours are 8:00 a.m. and 4:30 p.m. Monday - Friday.  Please note that voicemails left after 4:00 p.m. may not be returned until the following business day.  We are closed weekends and major holidays.  You do have access to a nurse 24-7, just call the main number to the clinic  [phone] and do not press any options, hold on the line and a nurse will answer the phone.   ? ?For prescription refill requests, have your pharmacy contact our office and allow 72 hours.   ? ?Due to Covid, you will need to wear a mask upon entering the hospital. If you do not have a mask, a mask will be given to you at the Main Entrance upon arrival. For doctor visits, patients may have 1 support person age 72 or older with them. For treatment visits, patients can not have anyone with them due to social distancing guidelines and our immunocompromised population.  ? ?   ?

## 2022-04-01 ENCOUNTER — Ambulatory Visit (HOSPITAL_COMMUNITY)
Admission: RE | Admit: 2022-04-01 | Discharge: 2022-04-01 | Disposition: A | Discharge status: Home / Self Care | Payer: BC Managed Care – PPO | Source: Ambulatory Visit | Attending: Hematology | Admitting: Hematology

## 2022-04-01 DIAGNOSIS — K7689 Other specified diseases of liver: Secondary | ICD-10-CM | POA: Diagnosis not present

## 2022-04-01 DIAGNOSIS — K6289 Other specified diseases of anus and rectum: Secondary | ICD-10-CM

## 2022-04-01 DIAGNOSIS — C20 Malignant neoplasm of rectum: Secondary | ICD-10-CM | POA: Diagnosis not present

## 2022-04-01 DIAGNOSIS — R160 Hepatomegaly, not elsewhere classified: Secondary | ICD-10-CM | POA: Diagnosis not present

## 2022-04-01 MED ORDER — GADOBUTROL 1 MMOL/ML IV SOLN
6.0000 mL | Freq: Once | INTRAVENOUS | Status: AC | PRN
  Administered 2022-04-01: 6 mL via INTRAVENOUS

## 2022-04-03 ENCOUNTER — Ambulatory Visit (HOSPITAL_COMMUNITY): Payer: BC Managed Care – PPO

## 2022-04-04 ENCOUNTER — Encounter: Payer: Self-pay | Admitting: Surgery

## 2022-04-04 ENCOUNTER — Ambulatory Visit: Payer: BC Managed Care – PPO | Admitting: Surgery

## 2022-04-04 VITALS — BP 115/70 | HR 84 | Temp 98.4°F | Resp 12 | Ht 68.0 in | Wt 137.0 lb

## 2022-04-04 DIAGNOSIS — C20 Malignant neoplasm of rectum: Secondary | ICD-10-CM | POA: Diagnosis not present

## 2022-04-04 NOTE — Progress Notes (Signed)
[facility] History and Physical ? ?Reason for Referral: Rectal mass ?Referring Physician: Dr. Abbey Chatters ? ?Chief Complaint   ?New Patient (Initial Visit) ?  ? ? ?Terri Mckinney is a 41 y.o. female.  ?HPI: Presents for newly diagnosed rectal adenocarcinoma.  The patient states that she has been having rectal pain and bleeding for the past 3 months.  She underwent a CT abdomen and pelvis on 4/5 which demonstrated an irregular circumferential masslike wall thickening of the sigmoid colon/rectum with adjacent perirectal adenopathy, findings are concerning for possibility of colonic malignancy at this location.  She underwent a colonoscopy on 4/18 with Dr. Abbey Chatters, and was noted to have a rectal mass on digital rectal exam, and mass was biopsied demonstrating invasive moderately differentiated adenocarcinoma.  This mass was near obstructing, as he was only just able to get a pediatric colonoscope past the mass.  She additionally has undergone PET scan which demonstrated multiple foci of hepatic hypermetabolism corresponding to tiny liver lesions.  This was highly suspicious for early hepatic metastasis.  She has undergone an abdominal MRI which again demonstrated multiple hypovascular rim-enhancing liver lesions and the predominantly in the right lobe of the liver, highly suspicious for liver metastases.  She states that she has constant anal/rectal pain which is worse with bowel movements.  She is able to have liquid bowel movements, and she takes MiraLAX and stool softeners for this.  She is still noting frank blood, clots, and mucus with her bowel movements.  She has a family history of colorectal cancer in an aunt.  She denies any history of abdominal surgeries.  She denies use of blood thinning medications.  She smokes 1 pack/day.  She denies use of alcohol and illegal drugs. ? ?Past Medical History:  ?Diagnosis Date  ? Depression   ? ? ?Past Surgical History:  ?Procedure Laterality Date  ? BIOPSY   03/21/2022  ? Procedure: BIOPSY;  Surgeon: [name], DO;  [facility];  Service: Endoscopy;;  ? COLONOSCOPY WITH PROPOFOL N/A 03/21/2022  ? Procedure: COLONOSCOPY WITH PROPOFOL;  Surgeon: [name], DO;  Location: [facility];  Service: Endoscopy;  Laterality: N/A;  8:30am  ? ? ?Family History  ?Problem Relation Age of Onset  ? Heart disease Mother   ? Diabetes Mother   ? Hearing loss Father   ? Heart disease Father   ? Hyperlipidemia Father   ? Hypertension Father   ? Stroke Father   ? Arthritis Father   ? Diabetes Father   ? Learning disabilities Brother   ? Diabetes Maternal Grandmother   ? Heart disease Maternal Grandmother   ? Diabetes Maternal Grandfather   ? Diabetes Paternal Grandmother   ? Diabetes Paternal Grandfather   ? ? ?Social History  ? ?Tobacco Use  ? Smoking status: Every Day  ?  Packs/day: 1.00  ?  Types: Cigarettes  ? Smokeless tobacco: Never  ?Vaping Use  ? Vaping Use: Never used  ?Substance Use Topics  ? Alcohol use: No  ? Drug use: No  ? ? ?Medications: I have reviewed the patient's current medications. ?Allergies as of 04/04/2022   ? ?   Reactions  ? Wellbutrin [bupropion]   ? insomnia  ? ?  ? ?  ?Medication List  ?  ? ?  ? Accurate as of Apr 04, 2022  1:50 PM. If you have any questions, ask your nurse or doctor.  ?  ?  ? ?  ? ?calcium carbonate 1250 (  500 Ca) MG chewable tablet ?Commonly known as: OS-CAL ?Chew 1 tablet by mouth daily. ?  ?diclofenac 75 MG EC tablet ?Commonly known as: VOLTAREN ?Take 1 tablet (75 mg total) by mouth 2 (two) times daily. ?  ?hydrocortisone 25 MG suppository ?Commonly known as: ANUSOL-HC ?Place 1 suppository (25 mg total) rectally 2 (two) times daily. ?  ?ibuprofen 400 MG tablet ?Commonly known as: ADVIL ?Take 1,000 mg by mouth every 6 (six) hours as needed. ?  ?medroxyPROGESTERone 150 MG/ML injection ?Commonly known as: DEPO-PROVERA ?INJECT 1 ML (150 MG TOTAL) INTO THE MUSCLE EVERY 3 (THREE) MONTHS ?  ?Multi-Vitamins Tabs ?Take by  mouth. ?  ?Vitamin D3 30 MCG/15ML Liqd ?Take by mouth. ?  ? ?  ? ? ? ?ROS:  ?Constitutional: negative for chills, fatigue, and fevers ?Eyes: negative for visual disturbance and pain ?Ears, nose, mouth, throat, and face: negative for ear drainage, sore throat, and sinus problems ?Respiratory: negative for cough, wheezing, and shortness of breath ?Cardiovascular: negative for chest pain and palpitations ?Gastrointestinal: positive for abdominal pain, negative for nausea, reflux symptoms, and vomiting ?Genitourinary:negative for dysuria, frequency, and urinary retention ?Integument/breast: negative for dryness and rash ?Hematologic/lymphatic: negative for bleeding and lymphadenopathy ?Musculoskeletal:negative for back pain, neck pain, and joint pain ?Neurological: negative for dizziness, tremors, and numbness ?Endocrine: negative for temperature intolerance ? ?Blood pressure 115/70, pulse 84, temperature 98.4 ?F (36.9 ?C), temperature source Oral, resp. rate 12, height '5\' 8"'$  (1.727 m), weight 137 lb (62.1 kg), SpO2 98 %. ?Physical Exam ?Vitals reviewed.  ?Constitutional:   ?   Appearance: Normal appearance.  ?HENT:  ?   Head: Normocephalic and atraumatic.  ?Eyes:  ?   Extraocular Movements: Extraocular movements intact.  ?   Pupils: Pupils are equal, round, and reactive to light.  ?Cardiovascular:  ?   Rate and Rhythm: Normal rate and regular rhythm.  ?Pulmonary:  ?   Effort: Pulmonary effort is normal.  ?   Breath sounds: Normal breath sounds.  ?Abdominal:  ?   Comments: Abdomen soft, nondistended, no percussion tenderness, nontender to palpation, no rigidity, guarding, or rebound tenderness; no inguinal lymphadenopathy palpable  ?Genitourinary: ?   Comments: Rectal exam deferred by the patient ?Musculoskeletal:     ?   General: Normal range of motion.  ?   Cervical back: Normal range of motion.  ?Skin: ?   General: Skin is warm and dry.  ?Neurological:  ?   General: No focal deficit present.  ?   Mental Status: She  is alert and oriented to person, place, and time.  ?Psychiatric:     ?   Mood and Affect: Mood normal.     ?   Behavior: Behavior normal.  ? ? ?Results: ?CT abdomen and pelvis (03/08/2022): ?IMPRESSION: ?1. There is irregular circumferential masslike wall thickening of ?the sigmoid colon/rectum with adjacent perirectal adenopathy. ?Findings are concerning for the possibility of colonic malignancy at ?this location. Alternative less likely considerations would be an ?infectious or inflammatory process. Recommend GI consultation and ?colonoscopy for further evaluation. ?2. There is a subtle 2.9 cm area of low attenuation within the ?central aspect of the liver which may represent a hepatic mass. ?Additionally there are multiple subcentimeter low-attenuation ?lesions in the liver. These are technically indeterminate however if ?the colonic process proves to be malignant, these are concerning for ?the possibility of hepatic metastasis. These need dedicated ?evaluation with pre and post contrast-enhanced abdominal MRI. ?3. Large amount of stool throughout the colon compatible with ?constipation, likely  secondary to obstructing mass within the distal ?colon. ?4. These results will be called to the ordering clinician or ?representative by the Radiologist Assistant, and communication ?documented in the PACS or [employer]. ? ?PET imaging (03/23/2022): ?IMPRESSION: ?1. Rectal primary with hypermetabolic regional nodal metastasis. ?2. Multiple foci of hepatic hypermetabolism, corresponding to tiny ?liver lesions on CT. Highly suspicious for early hepatic metastasis. ?This could either be confirmed with pre and post contrast abdominal ?MRI (which would allow delineation of the extent of disease) or ?focused ultrasound could be performed to evaluate for possible ?tissue sampling. ?3. Sinus disease. ? ?Abdominal MRI (04/01/2022): ?IMPRESSION: ?Multiple small hypovascular rim-enhancing liver lesions, ?predominantly in the right  hepatic lobe measuring up to 1.1 cm, ?highly suspicious for liver metastases. ?  ?3.3 cm hypervascular mass in the central liver, most consistent with ?focal nodular hyperplasia, with hepatic adenoma consid

## 2022-04-05 ENCOUNTER — Encounter (HOSPITAL_COMMUNITY): Payer: Self-pay

## 2022-04-05 ENCOUNTER — Encounter: Payer: Self-pay | Admitting: *Deleted

## 2022-04-05 ENCOUNTER — Other Ambulatory Visit (HOSPITAL_COMMUNITY): Payer: Self-pay | Admitting: *Deleted

## 2022-04-05 ENCOUNTER — Inpatient Hospital Stay (HOSPITAL_COMMUNITY): Payer: BC Managed Care – PPO | Admitting: Hematology

## 2022-04-05 DIAGNOSIS — K769 Liver disease, unspecified: Secondary | ICD-10-CM

## 2022-04-05 DIAGNOSIS — C20 Malignant neoplasm of rectum: Secondary | ICD-10-CM

## 2022-04-05 DIAGNOSIS — K6289 Other specified diseases of anus and rectum: Secondary | ICD-10-CM

## 2022-04-05 NOTE — Progress Notes (Unsigned)
Arne Cleveland, MD  Roosvelt Maser ?Ok  ? ?[place] core biopsy liver met  ?Avoid central 3cm FNH  ? ?DDH   ?  ?   ? ?

## 2022-04-06 ENCOUNTER — Other Ambulatory Visit: Payer: Self-pay | Admitting: *Deleted

## 2022-04-06 DIAGNOSIS — C20 Malignant neoplasm of rectum: Secondary | ICD-10-CM

## 2022-04-06 DIAGNOSIS — K6289 Other specified diseases of anus and rectum: Secondary | ICD-10-CM

## 2022-04-19 ENCOUNTER — Ambulatory Visit (INDEPENDENT_AMBULATORY_CARE_PROVIDER_SITE_OTHER): Payer: BC Managed Care – PPO | Admitting: Emergency Medicine

## 2022-04-19 ENCOUNTER — Other Ambulatory Visit: Payer: Self-pay | Admitting: Radiology

## 2022-04-19 ENCOUNTER — Other Ambulatory Visit: Payer: Self-pay | Admitting: Student

## 2022-04-19 DIAGNOSIS — C20 Malignant neoplasm of rectum: Secondary | ICD-10-CM

## 2022-04-19 DIAGNOSIS — Z3042 Encounter for surveillance of injectable contraceptive: Secondary | ICD-10-CM

## 2022-04-19 MED ORDER — MEDROXYPROGESTERONE ACETATE 150 MG/ML IM SUSP
150.0000 mg | Freq: Once | INTRAMUSCULAR | Status: AC
  Administered 2022-04-19: 150 mg via INTRAMUSCULAR

## 2022-04-19 NOTE — Progress Notes (Signed)
Patient presents for Depo Provera Injection, patient is within Window of time.  ? ?Injection given in L Deltoid ? ?Patient tolerated well.  ? ?Junko Ohagan. LPN  ? ?

## 2022-04-20 ENCOUNTER — Other Ambulatory Visit: Payer: Self-pay

## 2022-04-20 ENCOUNTER — Ambulatory Visit (HOSPITAL_COMMUNITY)
Admission: RE | Admit: 2022-04-20 | Discharge: 2022-04-20 | Disposition: A | Discharge status: Home / Self Care | Payer: BC Managed Care – PPO | Source: Ambulatory Visit | Attending: Hematology | Admitting: Hematology

## 2022-04-20 VITALS — BP 114/69 | HR 79 | Temp 97.9°F | Resp 18 | Ht 68.0 in | Wt 135.0 lb

## 2022-04-20 DIAGNOSIS — K6289 Other specified diseases of anus and rectum: Secondary | ICD-10-CM

## 2022-04-20 DIAGNOSIS — C20 Malignant neoplasm of rectum: Secondary | ICD-10-CM | POA: Diagnosis present

## 2022-04-20 DIAGNOSIS — K769 Liver disease, unspecified: Secondary | ICD-10-CM

## 2022-04-20 DIAGNOSIS — C787 Secondary malignant neoplasm of liver and intrahepatic bile duct: Secondary | ICD-10-CM | POA: Insufficient documentation

## 2022-04-20 LAB — CBC
HCT: 35.1 % — ABNORMAL LOW (ref 36.0–46.0)
Hemoglobin: 11.8 g/dL — ABNORMAL LOW (ref 12.0–15.0)
MCH: 31.8 pg (ref 26.0–34.0)
MCHC: 33.6 g/dL (ref 30.0–36.0)
MCV: 94.6 fL (ref 80.0–100.0)
Platelets: 279 10*3/uL (ref 150–400)
RBC: 3.71 MIL/uL — ABNORMAL LOW (ref 3.87–5.11)
RDW: 12.9 % (ref 11.5–15.5)
WBC: 7.4 10*3/uL (ref 4.0–10.5)
nRBC: 0 % (ref 0.0–0.2)

## 2022-04-20 LAB — PROTIME-INR
INR: 1.1 (ref 0.8–1.2)
Prothrombin Time: 14 seconds (ref 11.4–15.2)

## 2022-04-20 LAB — PREGNANCY, URINE: Preg Test, Ur: NEGATIVE

## 2022-04-20 MED ORDER — GELATIN ABSORBABLE 12-7 MM EX MISC
CUTANEOUS | Status: AC
  Filled 2022-04-20: qty 1

## 2022-04-20 MED ORDER — SODIUM CHLORIDE 0.9 % IV SOLN: INTRAVENOUS | Status: DC

## 2022-04-20 MED ORDER — HYDROCODONE-ACETAMINOPHEN 5-325 MG PO TABS: 1.0000 | ORAL_TABLET | Freq: Once | ORAL | Status: DC | PRN

## 2022-04-20 MED ORDER — FENTANYL CITRATE (PF) 100 MCG/2ML IJ SOLN
INTRAMUSCULAR | Status: AC
  Filled 2022-04-20: qty 2

## 2022-04-20 MED ORDER — MIDAZOLAM HCL 2 MG/2ML IJ SOLN
INTRAMUSCULAR | Status: AC
  Filled 2022-04-20: qty 2

## 2022-04-20 MED ORDER — SODIUM CHLORIDE 0.9 % IV SOLN
INTRAVENOUS | Status: AC | PRN
  Administered 2022-04-20: 10 mL/h via INTRAVENOUS

## 2022-04-20 MED ORDER — MIDAZOLAM HCL 2 MG/2ML IJ SOLN
INTRAMUSCULAR | Status: AC | PRN
  Administered 2022-04-20: 1 mg via INTRAVENOUS
  Administered 2022-04-20: .5 mg via INTRAVENOUS

## 2022-04-20 MED ORDER — LIDOCAINE HCL (PF) 1 % IJ SOLN
INTRAMUSCULAR | Status: AC
  Filled 2022-04-20: qty 30

## 2022-04-20 MED ORDER — FENTANYL CITRATE (PF) 100 MCG/2ML IJ SOLN
INTRAMUSCULAR | Status: AC | PRN
  Administered 2022-04-20: 50 ug via INTRAVENOUS
  Administered 2022-04-20: 25 ug via INTRAVENOUS

## 2022-04-20 NOTE — Procedures (Signed)
Interventional Radiology Procedure Note  Procedure: US guided right liver lesion biopsy  Indication: Rectal Ca  Findings: Please refer to procedural dictation for full description.  Complications: None  EBL: < 10 mL  Miachel Roux, MD [phone]

## 2022-04-20 NOTE — H&P (Signed)
Chief Complaint: Patient was seen in consultation today for liver lesions at the request of [place]  Referring Physician(s): Derek Jack  Supervising Physician: Mir, Sharen Heck  Patient Status: [name] - Out-pt  History of Present Illness: Terri Mckinney is a 41 y.o. female  was recently diagnosed with rectal adenocarcinoma.  PET demonstrated multiple foci of hepatic hypermetabolism corresponding to tiny liver lesions, concerning for early metastasis.  She presents to IR for liver lesion biopsy.  She endorses pain at the rectum with urgency to go to the bathroom frequently.  Otherwise, she reports feeling well.  She is NPO and denies use of blood thinning medications.    Past Medical History:  Diagnosis Date   Depression     Past Surgical History:  Procedure Laterality Date   BIOPSY  03/21/2022   Procedure: BIOPSY;  Surgeon: [name], DO;  [facility]: [facility];  Service: Endoscopy;;   COLONOSCOPY WITH PROPOFOL N/A 03/21/2022   Procedure: COLONOSCOPY WITH PROPOFOL;  Surgeon: [name], DO;  [facility]: [facility];  Service: Endoscopy;  Laterality: N/A;  8:30am    Allergies: Wellbutrin [bupropion]  Medications: Prior to Admission medications   Medication Sig Start Date End Date Taking? Authorizing Provider  calcium carbonate (OS-CAL) 1250 (500 Ca) MG chewable tablet Chew 1 tablet by mouth daily.    [provider]  Cholecalciferol (VITAMIN D3) 30 MCG/15ML LIQD Take by mouth.    [provider]  diclofenac (VOLTAREN) 75 MG EC tablet Take 1 tablet (75 mg total) by mouth 2 (two) times daily. 01/27/22   Sharion Balloon, FNP  hydrocortisone (ANUSOL-HC) 25 MG suppository Place 1 suppository (25 mg total) rectally 2 (two) times daily. 08/22/21   Evelina Dun A, FNP  ibuprofen (ADVIL) 400 MG tablet Take 1,000 mg by mouth every 6 (six) hours as needed.    [provider]  medroxyPROGESTERone (DEPO-PROVERA) 150 MG/ML  injection INJECT 1 ML (150 MG TOTAL) INTO THE MUSCLE EVERY 3 (THREE) MONTHS 11/01/21   Sharion Balloon, FNP  Multiple Vitamin (MULTI-VITAMINS) TABS Take by mouth.    [provider]     Family History  Problem Relation Age of Onset   Heart disease Mother    Diabetes Mother    Hearing loss Father    Heart disease Father    Hyperlipidemia Father    Hypertension Father    Stroke Father    Arthritis Father    Diabetes Father    Learning disabilities Brother    Diabetes Maternal Grandmother    Heart disease Maternal Grandmother    Diabetes Maternal Grandfather    Diabetes Paternal Grandmother    Diabetes Paternal Grandfather     Social History   Socioeconomic History   Marital status: Married    Spouse name: Not on file   Number of children: Not on file   Years of education: Not on file   Highest education level: Not on file  Occupational History   Not on file  Tobacco Use   Smoking status: Every Day    Packs/day: 1.00    Types: Cigarettes   Smokeless tobacco: Never  Vaping Use   Vaping Use: Never used  Substance and Sexual Activity   Alcohol use: No   Drug use: No   Sexual activity: Yes    Birth control/protection: Injection  Other Topics Concern   Not on file  Social History Narrative   Not on file   Social Determinants of Radio broadcast assistant  Strain: Not on file  Food Insecurity: Not on file  Transportation Needs: Not on file  Physical Activity: Not on file  Stress: Not on file  Social Connections: Not on file    Review of Systems: A 12 point ROS discussed and pertinent positives are indicated in the HPI above.  All other systems are negative.  Vital Signs: BP 128/76   Pulse 78   Temp 97.9 F (36.6 C) (Oral)   Resp 12   Ht '5\' 8"'$  (1.727 m)   Wt 135 lb (61.2 kg)   SpO2 100%   BMI 20.53 kg/m   Physical Exam Constitutional:      General: She is not in acute distress.    Appearance: She is not ill-appearing.  HENT:      Mouth/Throat:     Mouth: Mucous membranes are moist.     Pharynx: Oropharynx is clear.  Eyes:     Extraocular Movements: Extraocular movements intact.  Cardiovascular:     Rate and Rhythm: Normal rate.     Pulses: Normal pulses.  Pulmonary:     Effort: Pulmonary effort is normal.  Abdominal:     General: Abdomen is flat.     Palpations: Abdomen is soft.  Skin:    General: Skin is warm and dry.     Capillary Refill: Capillary refill takes less than 2 seconds.  Neurological:     General: No focal deficit present.     Mental Status: She is alert and oriented to person, place, and time.  Psychiatric:        Mood and Affect: Mood normal.        Behavior: Behavior normal.    Imaging: MR Abdomen W Wo Contrast  Result Date: 04/03/2022 CLINICAL DATA:  Rectal carcinoma. Indeterminate liver lesions on recent CT. EXAM: MRI ABDOMEN WITHOUT AND WITH CONTRAST TECHNIQUE: Multiplanar multisequence MR imaging of the abdomen was performed both before and after the administration of intravenous contrast. CONTRAST:  54m GADAVIST GADOBUTROL 1 MMOL/ML IV SOLN COMPARISON:  CT on 03/08/2022, and PET-CT on 03/23/2022 FINDINGS: Lower chest: No acute findings. Hepatobiliary: A 3.3 x 2.8 cm mass is seen in the central liver adjacent to the portal venous bifurcation. This shows homogeneous arterial phase hyperenhancement which becomes isointense with liver on delayed imaging in T2 isointensity. This is consistent with focal nodular hyperplasia, with hepatic adenoma considered less likely. Multiple small rim enhancing lesions are seen in the right hepatic lobe, largest measuring 1.1 x 1.1 cm on image 43/15. These lesions also show restricted diffusion, and are highly suspicious for small liver metastases. Gallbladder is unremarkable. No evidence of biliary ductal dilatation. Pancreas:  No mass or inflammatory changes. Spleen:  Within normal limits in size and appearance. Adrenals/Urinary Tract: No masses identified. No  evidence of hydronephrosis. Stomach/Bowel: Unremarkable. Vascular/Lymphatic: No pathologically enlarged lymph nodes identified. No acute vascular findings. Other:  None. Musculoskeletal:  No suspicious bone lesions identified. IMPRESSION: Multiple small hypovascular rim-enhancing liver lesions, predominantly in the right hepatic lobe measuring up to 1.1 cm, highly suspicious for liver metastases. 3.3 cm hypervascular mass in the central liver, most consistent with focal nodular hyperplasia, with hepatic adenoma considered less likely. Recommend continued attention on follow-up imaging. Electronically Signed   By: JMarlaine HindM.D.   On: 04/03/2022 09:02   NM PET Image Initial (PI) Skull Base To Thigh (F-18 FDG)  Result Date: 03/24/2022 CLINICAL DATA:  Initial treatment strategy for new diagnosis of rectal cancer. EXAM: NUCLEAR MEDICINE PET SKULL BASE  TO THIGH TECHNIQUE: 6.6 mCi F-18 FDG was injected intravenously. Full-ring PET imaging was performed from the skull base to thigh after the radiotracer. CT data was obtained and used for attenuation correction and anatomic localization. Fasting blood glucose: 85 mg/dl COMPARISON:  Abdominopelvic CT 03/08/2022 FINDINGS: Mediastinal blood pool activity: SUV max 1.9 Liver activity: SUV max NA NECK: No areas of abnormal hypermetabolism. Incidental CT findings: No cervical adenopathy. Left maxillary sinus mucous retention cysts or polyps. CHEST: No pulmonary parenchymal or thoracic nodal hypermetabolism. Incidental CT findings: No thoracic adenopathy. ABDOMEN/PELVIS: The central hepatic area of subtle hypoattenuation is without PET correlate. However, there are multiple tiny foci of hepatic hypermetabolism which correspond to the subcentimeter hypoattenuating lesions on prior CT. Example within the subcapsular anterior right hepatic lobe at a S.U.V. max of 3.2 on approximately image 140. Within the inferior right hepatic lobe (corresponding to a subcentimeter  hypoattenuating lesion on 36/2 of the prior diagnostic CT) at a S.U.V. max of 3.7 on 184/3 today. Hypermetabolism corresponding to a left posterior perirectal node at 7 mm and a S.U.V. max of 2.9 on 239/3. Hypermetabolic rectal primary is relatively long segment at a S.U.V. max of 14.3 including on 244/3. Incidental CT findings: Deferred to recent diagnostic CT. Old granulomatous disease in the spleen. No bowel obstruction or other acute superimposed process. SKELETON: No abnormal marrow activity. Incidental CT findings: none IMPRESSION: 1. Rectal primary with hypermetabolic regional nodal metastasis. 2. Multiple foci of hepatic hypermetabolism, corresponding to tiny liver lesions on CT. Highly suspicious for early hepatic metastasis. This could either be confirmed with pre and post contrast abdominal MRI (which would allow delineation of the extent of disease) or focused ultrasound could be performed to evaluate for possible tissue sampling. 3. Sinus disease. Electronically Signed   By: Abigail Miyamoto M.D.   On: 03/24/2022 16:14    Labs:  CBC: Recent Labs    08/22/21 1608 01/27/22 1608 03/03/22 1618  WBC 11.2* 13.3* 11.0*  HGB 14.2 12.8 12.3  HCT 41.1 37.7 35.4  PLT 309 370 351    COAGS: No results for input(s): INR, APTT in the last 8760 hours.  BMP: Recent Labs    08/22/21 1608 01/27/22 1608 03/03/22 1618  NA 140 141 144  K 4.1 4.5 4.5  CL 104 106 111*  CO2 '20 21 21  '$ GLUCOSE 72 77 61*  BUN '10 8 10  '$ CALCIUM 9.5 9.4 8.8  CREATININE 0.81 0.68 0.75    LIVER FUNCTION TESTS: Recent Labs    08/22/21 1608 01/27/22 1608 03/03/22 1618  BILITOT 0.4 <0.2 <0.2  AST '11 10 9  '$ ALT '10 13 9  '$ ALKPHOS 59 57 49  PROT 6.3 6.4 5.8*  ALBUMIN 4.6 4.2 4.0    TUMOR MARKERS: No results for input(s): AFPTM, CEA, CA199, CHROMGRNA in the last 8760 hours.  Assessment and Plan:  Liver lesions --thought to be associated with newly diagnosed rectal cancer --for liver lesion biopsy with planned  d/c later today. --is NPO, not on blood thinners, and agreeable to proceed  Risks and benefits of liver biopsy was discussed with the patient and/or patient's family including, but not limited to bleeding, infection, damage to adjacent structures or low yield requiring additional tests.  All of the questions were answered and there is agreement to proceed.  Consent signed and in chart.   Thank you for this interesting consult.  I greatly enjoyed meeting Emanuel Campos and look forward to participating in their care.  A copy of this  report was sent to the requesting provider on this date.  Electronically Signed: Pasty Spillers, PA 04/20/2022, 12:00 PM   I spent a total of 15 Minutes  in face to face in clinical consultation, greater than 50% of which was counseling/coordinating care for liver biopsy

## 2022-04-21 LAB — SURGICAL PATHOLOGY

## 2022-04-25 ENCOUNTER — Ambulatory Visit: Payer: Self-pay | Admitting: General Surgery

## 2022-04-25 NOTE — H&P (Signed)
REFERRING PHYSICIAN:  Graciella Freer  PROVIDER:  Monico Blitz, MD  MRN: [id number] DOB: [birth year] DATE OF ENCOUNTER: 04/11/2022  Subjective   Chief Complaint: Rectal Cancer     History of Present Illness: Terri Mckinney is a 41 y.o. female who is seen today as an office consultation at the request of Dr. Okey Dupre for evaluation of Rectal Cancer .  41 year old female who presents to the office with a newly diagnosed rectal adenocarcinoma.  She has been having bleeding for the past few months.  She developed left lower quadrant pain as well and underwent a CT scan of the abdomen pelvis on April 5.  This showed a rectal mass with perirectal adenopathy concerning for malignancy.  She underwent a colonoscopy on 4/18 and was noted to have a rectal mass on digital rectal exam.  Biopsies show adenocarcinoma moderately differentiated.  This was noted to be obstructive and could only be bypassed with a pediatric scope.  The colonoscopy was not completed.  She then underwent a PET scan which showed multiple hepatic hypermetabolic lesions in her liver.  She also underwent an abdominal MRI which again demonstrated the liver lesions highly suspicious for metastases.  She complains of constant anal rectal pain and bleeding.  She smokes 1 pack a day.  No other major medical history noted.  No major abdominal surgeries noted.  She does have a family history of colon cancer.   Review of Systems: A complete review of systems was obtained from the patient.  I have reviewed this information and discussed as appropriate with the patient.  See HPI as well for other ROS.    Medical History: Past Medical History:  Diagnosis Date   History of cancer     There is no problem list on file for this patient.   Past Surgical History:  Procedure Laterality Date   COLONOSCOPY  03/21/2022     Allergies  Allergen Reactions   Bupropion Other (See Comments)    insomnia    Current  Outpatient Medications on File Prior to Visit  Medication Sig Dispense Refill   cholecalciferol 1000 unit tablet Take by mouth     ibuprofen (MOTRIN) 400 MG tablet Take 1,000 mg by mouth every 6 (six) hours as needed     medroxyPROGESTERone (DEPO-PROVERA) 150 mg/mL injection      multivitamin (MULTIPLE VITAMIN ORAL) Take by mouth     No current facility-administered medications on file prior to visit.    Family History  Problem Relation Age of Onset   Hyperlipidemia (Elevated cholesterol) Mother    Coronary Artery Disease (Blocked arteries around heart) Mother    Diabetes Mother    Stroke Father    Obesity Father    Hyperlipidemia (Elevated cholesterol) Father    Coronary Artery Disease (Blocked arteries around heart) Father    Diabetes Father    Colon cancer Sister    Obesity Sister    Hyperlipidemia (Elevated cholesterol) Sister    Diabetes Sister      Social History   Tobacco Use  Smoking Status Every Day   Packs/day: 1.00   Types: Cigarettes  Smokeless Tobacco Never     Social History   Socioeconomic History   Marital status: Married  Tobacco Use   Smoking status: Every Day    Packs/day: 1.00    Types: Cigarettes   Smokeless tobacco: Never  Vaping Use   Vaping Use: Unknown  Substance and Sexual Activity   Alcohol use: Never   Drug use: Never  Objective:    Vitals:   04/11/22 0948  BP: 130/74  Pulse: 103  Temp: 36.8 C (98.3 F)  SpO2: 99%  Weight: 61.2 kg (135 lb)  Height: 172.7 cm ('5\' 8"'$ )     Exam Gen: NAD Abd: soft, non-distended Rectal: Mass palpated at approximately 8 cm.  Central opening less than 1 cm   Labs, Imaging and Diagnostic Testing: Colonoscopy report reviewed.  This appears to be a mass on the second valve of the rectum.  It is partially obstructive. CT scan show several hepatic lesions as well with a large amount of stool throughout the colon.  There is also some perirectal adenopathy noted. CEA is elevated at 13.4. PET  scan shows rectal primary with hypermetabolic regional lymph nodes and hypermetabolic liver lesions.  Assessment and Plan:  Diagnoses and all orders for this visit:  Malignant neoplasm of rectum (CMS-HCC)    41 year old female with a mid rectal cancer with what appears to be liver metastases.  She is clinically having signs of obstruction.  We discussed that chemotherapy would most likely cause her to be completely obstructed, and I recommend a diverting ostomy prior to starting chemo for her metastatic disease.  Patient is unsure if she wants an ostomy.  We discussed her other possibility would be starting chemo without an ostomy and then having this done emergently if she were to become obstructed (which would be likely).  We discussed that this would be much more risky due to her being on chemo while undergoing this surgery.  I also recommend port placement and distal tattoo of her rectal mass during surgery.  We would then return her to oncology for chemotherapy and subsequent radiation followed by definitive surgery and most likely diverting ileostomy in the future.  I offered her the possibility of having her diversion and port placement done in [place], but she would rather have this done down here.  She will also need an MRI of the pelvis, ideally after finishing chemo and before radiation.    Patient has decided to proceed with diverting ostomy, tattooing of lesion and port placement.  We will get this scheduled.  Risks include bleeding, damage to adjacent structures, need for additional procedures and infection.    Rosario Adie, MD Colon and Rectal Surgery [facility]

## 2022-04-25 NOTE — H&P (View-Only) (Signed)
REFERRING PHYSICIAN:  Graciella Freer  PROVIDER:  Monico Blitz, MD  MRN: [id number] DOB: 1981/02/07 DATE OF ENCOUNTER: 04/11/2022  Subjective   Chief Complaint: Rectal Cancer     History of Present Illness: NEKISHA MCDIARMID is a 41 y.o. female who is seen today as an office consultation at the request of Dr. Okey Dupre for evaluation of Rectal Cancer .  41 year old female who presents to the office with a newly diagnosed rectal adenocarcinoma.  She has been having bleeding for the past few months.  She developed left lower quadrant pain as well and underwent a CT scan of the abdomen pelvis on April 5.  This showed a rectal mass with perirectal adenopathy concerning for malignancy.  She underwent a colonoscopy on 4/18 and was noted to have a rectal mass on digital rectal exam.  Biopsies show adenocarcinoma moderately differentiated.  This was noted to be obstructive and could only be bypassed with a pediatric scope.  The colonoscopy was not completed.  She then underwent a PET scan which showed multiple hepatic hypermetabolic lesions in her liver.  She also underwent an abdominal MRI which again demonstrated the liver lesions highly suspicious for metastases.  She complains of constant anal rectal pain and bleeding.  She smokes 1 pack a day.  No other major medical history noted.  No major abdominal surgeries noted.  She does have a family history of colon cancer.   Review of Systems: A complete review of systems was obtained from the patient.  I have reviewed this information and discussed as appropriate with the patient.  See HPI as well for other ROS.    Medical History: Past Medical History:  Diagnosis Date   History of cancer     There is no problem list on file for this patient.   Past Surgical History:  Procedure Laterality Date   COLONOSCOPY  03/21/2022     Allergies  Allergen Reactions   Bupropion Other (See Comments)    insomnia    Current  Outpatient Medications on File Prior to Visit  Medication Sig Dispense Refill   cholecalciferol 1000 unit tablet Take by mouth     ibuprofen (MOTRIN) 400 MG tablet Take 1,000 mg by mouth every 6 (six) hours as needed     medroxyPROGESTERone (DEPO-PROVERA) 150 mg/mL injection      multivitamin (MULTIPLE VITAMIN ORAL) Take by mouth     No current facility-administered medications on file prior to visit.    Family History  Problem Relation Age of Onset   Hyperlipidemia (Elevated cholesterol) Mother    Coronary Artery Disease (Blocked arteries around heart) Mother    Diabetes Mother    Stroke Father    Obesity Father    Hyperlipidemia (Elevated cholesterol) Father    Coronary Artery Disease (Blocked arteries around heart) Father    Diabetes Father    Colon cancer Sister    Obesity Sister    Hyperlipidemia (Elevated cholesterol) Sister    Diabetes Sister      Social History   Tobacco Use  Smoking Status Every Day   Packs/day: 1.00   Types: Cigarettes  Smokeless Tobacco Never     Social History   Socioeconomic History   Marital status: Married  Tobacco Use   Smoking status: Every Day    Packs/day: 1.00    Types: Cigarettes   Smokeless tobacco: Never  Vaping Use   Vaping Use: Unknown  Substance and Sexual Activity   Alcohol use: Never   Drug use: Never  Objective:    Vitals:   04/11/22 0948  BP: 130/74  Pulse: 103  Temp: 36.8 C (98.3 F)  SpO2: 99%  Weight: 61.2 kg (135 lb)  Height: 172.7 cm ('5\' 8"'$ )     Exam Gen: NAD Abd: soft, non-distended Rectal: Mass palpated at approximately 8 cm.  Central opening less than 1 cm   Labs, Imaging and Diagnostic Testing: Colonoscopy report reviewed.  This appears to be a mass on the second valve of the rectum.  It is partially obstructive. CT scan show several hepatic lesions as well with a large amount of stool throughout the colon.  There is also some perirectal adenopathy noted. CEA is elevated at 13.4. PET  scan shows rectal primary with hypermetabolic regional lymph nodes and hypermetabolic liver lesions.  Assessment and Plan:  Diagnoses and all orders for this visit:  Malignant neoplasm of rectum (CMS-HCC)    41 year old female with a mid rectal cancer with what appears to be liver metastases.  She is clinically having signs of obstruction.  We discussed that chemotherapy would most likely cause her to be completely obstructed, and I recommend a diverting ostomy prior to starting chemo for her metastatic disease.  Patient is unsure if she wants an ostomy.  We discussed her other possibility would be starting chemo without an ostomy and then having this done emergently if she were to become obstructed (which would be likely).  We discussed that this would be much more risky due to her being on chemo while undergoing this surgery.  I also recommend port placement and distal tattoo of her rectal mass during surgery.  We would then return her to oncology for chemotherapy and subsequent radiation followed by definitive surgery and most likely diverting ileostomy in the future.  I offered her the possibility of having her diversion and port placement done in [place], but she would rather have this done down here.  She will also need an MRI of the pelvis, ideally after finishing chemo and before radiation.    Patient has decided to proceed with diverting ostomy, tattooing of lesion and port placement.  We will get this scheduled.  Risks include bleeding, damage to adjacent structures, need for additional procedures and infection.    Rosario Adie, MD Colon and Rectal Surgery [facility]

## 2022-05-08 ENCOUNTER — Inpatient Hospital Stay (HOSPITAL_COMMUNITY): Payer: BC Managed Care – PPO | Attending: Hematology | Admitting: Hematology

## 2022-05-08 VITALS — BP 112/51 | HR 86 | Temp 98.6°F | Resp 17 | Ht 68.0 in | Wt 136.2 lb

## 2022-05-08 DIAGNOSIS — Z806 Family history of leukemia: Secondary | ICD-10-CM | POA: Diagnosis not present

## 2022-05-08 DIAGNOSIS — K921 Melena: Secondary | ICD-10-CM | POA: Insufficient documentation

## 2022-05-08 DIAGNOSIS — F17210 Nicotine dependence, cigarettes, uncomplicated: Secondary | ICD-10-CM | POA: Insufficient documentation

## 2022-05-08 DIAGNOSIS — C20 Malignant neoplasm of rectum: Secondary | ICD-10-CM | POA: Diagnosis present

## 2022-05-08 DIAGNOSIS — Z800 Family history of malignant neoplasm of digestive organs: Secondary | ICD-10-CM | POA: Insufficient documentation

## 2022-05-08 DIAGNOSIS — Z803 Family history of malignant neoplasm of breast: Secondary | ICD-10-CM | POA: Insufficient documentation

## 2022-05-08 DIAGNOSIS — R197 Diarrhea, unspecified: Secondary | ICD-10-CM | POA: Insufficient documentation

## 2022-05-08 DIAGNOSIS — Z5111 Encounter for antineoplastic chemotherapy: Secondary | ICD-10-CM | POA: Insufficient documentation

## 2022-05-08 DIAGNOSIS — M545 Low back pain, unspecified: Secondary | ICD-10-CM | POA: Diagnosis not present

## 2022-05-08 DIAGNOSIS — C787 Secondary malignant neoplasm of liver and intrahepatic bile duct: Secondary | ICD-10-CM | POA: Diagnosis not present

## 2022-05-08 NOTE — Patient Instructions (Addendum)
[facility] at [facility] Discharge Instructions You were seen and examined today by Dr. Delton Coombes.  Dr. Delton Coombes discussed your most recent biopsy and office visit with Dr. Marcello Moores.  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 Stage IV cancer cannot be cured but can be controlled with treatment, including chemotherapy.  Dr. Delton Coombes has recommended proceeding with Port-A-Cath placement and colostomy placement.  Your treatment will include a combination of chemotherapy drugs to be given here in the [place].  Follow-up as scheduled.    Thank you for choosing [place] at [facility] to provide your oncology and hematology care.  To afford each patient quality time with our provider, please arrive at least 15 minutes before your scheduled appointment time.   If you have a lab appointment with the [place] please come in thru the Main Entrance and check in at the main information desk.  You need to re-schedule your appointment should you arrive 10 or more minutes late.  We strive to give you quality time with our providers, and arriving late affects you and other patients whose appointments are after yours.  Also, if you no show three or more times for appointments you may be dismissed from the clinic at the providers discretion.     Again, thank you for choosing [facility].  Our hope is that these requests will decrease the amount of time that you wait before being seen by our physicians.       _____________________________________________________________  Should you have questions after your visit to [facility], please contact our office at [phone] and follow the prompts.  Our office hours are 8:00 a.m. and 4:30 p.m. Monday - Friday.  Please note that voicemails left after 4:00 p.m. may not be returned until the following  business day.  We are closed weekends and major holidays.  You do have access to a nurse 24-7, just call the main number to the clinic [phone] and do not press any options, hold on the line and a nurse will answer the phone.    For prescription refill requests, have your pharmacy contact our office and allow 72 hours.    Due to Covid, you will need to wear a mask upon entering the hospital. If you do not have a mask, a mask will be given to you at the Main Entrance upon arrival. For doctor visits, patients may have 1 support person age 71 or older with them. For treatment visits, patients can not have anyone with them due to social distancing guidelines and our immunocompromised population.

## 2022-05-08 NOTE — Progress Notes (Signed)
START ON PATHWAY REGIMEN - Colorectal     A cycle is every 14 days:     Bevacizumab-xxxx      Irinotecan      Oxaliplatin      Leucovorin      Fluorouracil   **Always confirm dose/schedule in your pharmacy ordering system**  Patient Characteristics: Distant Metastases, Nonsurgical Candidate, KRAS/NRAS Mutation Positive/Unknown (BRAF V600 Wild-Type/Unknown), Standard Cytotoxic Therapy, First Line Standard Cytotoxic Therapy, Bevacizumab Eligible, PS = 0,1 Tumor Location: Rectal Therapeutic Status: Distant Metastases Microsatellite/Mismatch Repair Status: Unknown BRAF Mutation Status: Awaiting Test Results KRAS/NRAS Mutation Status: Awaiting Test Results Standard Cytotoxic Line of Therapy: First Line Standard Cytotoxic Therapy ECOG Performance Status: 0 Bevacizumab Eligibility: Eligible Intent of Therapy: Non-Curative / Palliative Intent, Discussed with Patient

## 2022-05-08 NOTE — Progress Notes (Signed)
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    CANCER STAGING: Cancer Staging  Rectal cancer [facility]) Staging form: Colon and Rectum, AJCC 8th Edition - Clinical stage from 03/28/2022: Stage IVA (cTX, cN1, cM1a) - Unsigned   INTERVAL HISTORY:  [name], a 41 y.o. female, returns for routine follow-up of her rectal mass. Kathrynne was last seen on 03/28/2022.   Today she reports feeling good. She reports pain radiating from her tailbone to her perineum. She reports intermittent hematochezia and diarrhea. She reports she is not able to feel when she passes stools. She takes 1000 mg of ibuprofen every 6 hours.   REVIEW OF SYSTEMS:  Review of Systems  Constitutional:  Negative for appetite change and fatigue.  Gastrointestinal:  Positive for blood in stool and diarrhea.  Genitourinary:  Positive for pelvic pain.   Psychiatric/Behavioral:  Positive for sleep disturbance. The patient is nervous/anxious.   All other systems reviewed and are negative.  PAST MEDICAL/SURGICAL HISTORY:  Past Medical History:  Diagnosis Date   Depression    Past Surgical History:  Procedure Laterality Date   BIOPSY  03/21/2022   Procedure: BIOPSY;  Surgeon: [name], DO;  Location: [facility];  Service: Endoscopy;;   COLONOSCOPY WITH PROPOFOL N/A 03/21/2022   Procedure: COLONOSCOPY WITH PROPOFOL;  Surgeon: [name], DO;  Location: [facility];  Service: Endoscopy;  Laterality: N/A;  8:30am    SOCIAL HISTORY:  Social History   Socioeconomic History   Marital status: Married     Spouse name: Not on file   Number of children: Not on file   Years of education: Not on file   Highest education level: Not on file  Occupational History   Not on file  Tobacco Use   Smoking status: Every Day    Packs/day: 1.00    Types: Cigarettes   Smokeless tobacco: Never  Vaping Use   Vaping Use: Never used  Substance and Sexual Activity   Alcohol use: No   Drug use: No   Sexual activity: Yes    Birth control/protection: Injection  Other Topics Concern   Not on file  Social History Narrative   Not on file   Social Determinants of Health   Financial Resource Strain: Not on file  Food Insecurity: Not on file  Transportation Needs: Not on file  Physical Activity: Not on file  Stress: Not on file  Social Connections: Not on file  Intimate Partner Violence: Not on file    FAMILY HISTORY:  Family History  Problem Relation Age of Onset   Heart disease Mother    Diabetes Mother    Hearing loss Father    Heart disease Father    Hyperlipidemia Father    Hypertension Father    Stroke Father    Arthritis Father    Diabetes Father    Learning disabilities Brother    Diabetes Maternal Grandmother    Heart disease Maternal Grandmother    Diabetes Maternal Grandfather    Diabetes Paternal Grandmother    Diabetes Paternal Merchant navy officer  CURRENT MEDICATIONS:  Current Outpatient Medications  Medication Sig Dispense Refill   calcium carbonate (OS-CAL) 1250 (500 Ca) MG chewable tablet Chew 1 tablet by mouth daily.     Cholecalciferol (VITAMIN D3) 30 MCG/15ML LIQD Take by mouth.     diclofenac (VOLTAREN) 75 MG EC tablet Take 1 tablet (75 mg total) by mouth 2 (two) times daily. 60 tablet 1   hydrocortisone (ANUSOL-HC) 25 MG suppository Place 1 suppository (25 mg total) rectally 2 (two) times daily. 12 suppository 0   ibuprofen (ADVIL) 400 MG tablet Take 1,000 mg by mouth every 6 (six) hours as needed.     medroxyPROGESTERone (DEPO-PROVERA) 150 MG/ML injection INJECT 1 ML  (150 MG TOTAL) INTO THE MUSCLE EVERY 3 (THREE) MONTHS 1 mL 2   Multiple Vitamin (MULTI-VITAMINS) TABS Take by mouth.     No current facility-administered medications for this visit.    ALLERGIES:  Allergies  Allergen Reactions   Wellbutrin [Bupropion]     insomnia    PHYSICAL EXAM:  Performance status (ECOG): 0 - Asymptomatic  There were no vitals filed for this visit. Wt Readings from Last 3 Encounters:  04/20/22 135 lb (61.2 kg)  04/04/22 137 lb (62.1 kg)  03/21/22 139 lb (63 kg)   Physical Exam Vitals reviewed.  Constitutional:      Appearance: Normal appearance.  Cardiovascular:     Rate and Rhythm: Normal rate and regular rhythm.     Pulses: Normal pulses.     Heart sounds: Normal heart sounds.  Pulmonary:     Effort: Pulmonary effort is normal.     Breath sounds: Normal breath sounds.  Neurological:     General: No focal deficit present.     Mental Status: She is alert and oriented to person, place, and time.  Psychiatric:        Mood and Affect: Mood normal.        Behavior: Behavior normal.     LABORATORY DATA:  I have reviewed the labs as listed.     Latest Ref Rng & Units 04/20/2022   12:05 PM 03/03/2022    4:18 PM 01/27/2022    4:08 PM  CBC  WBC 4.0 - 10.5 K/uL 7.4   11.0   13.3    Hemoglobin 12.0 - 15.0 g/dL 11.8   12.3   12.8    Hematocrit 36.0 - 46.0 % 35.1   35.4   37.7    Platelets 150 - 400 K/uL 279   351   370        Latest Ref Rng & Units 03/03/2022    4:18 PM 01/27/2022    4:08 PM 08/22/2021    4:08 PM  CMP  Glucose 70 - 99 mg/dL 61   77   72    BUN 6 - 24 mg/dL _0 Creatinine 0.57 - 1.00 mg/dL 0.75   0.68   0.81    Sodium 134 - 144 mmol/L 144   141   140    Potassium 3.5 - 5.2 mmol/L 4.5   4.5   4.1    Chloride 96 - 106 mmol/L 111   106   104    CO2 20 - 29 mmol/L _1 Calcium 8.7 - 10.2 mg/dL 8.8   9.4   9.5    Total Protein 6.0 - 8.5 g/dL 5.8   6.4   6.3  Total Bilirubin 0.0 - 1.2 mg/dL <0.2   <0.2   0.4     Alkaline Phos 44 - 121 IU/L 49   57   59    AST 0 - 40 IU/L _0 ALT 0 - 32 IU/L _1 DIAGNOSTIC IMAGING:  I have independently reviewed the scans and discussed with the patient. [place] CORE BIOPSY (LIVER)  Result Date: 04/20/2022 INDICATION: 41 year old woman with history of rectal malignancy presents to IR for liver lesion biopsy. EXAM: Ultrasound-guided right liver lesion biopsy MEDICATIONS: None. ANESTHESIA/SEDATION: Moderate (conscious) sedation was employed during this procedure. A total of Versed 1.5 mg and Fentanyl 75 mcg was administered intravenously by the radiology nurse. Total intra-service moderate Sedation Time: 15 minutes. The patient's level of consciousness and vital signs were monitored continuously by radiology nursing throughout the procedure under my direct supervision. COMPLICATIONS: None immediate. PROCEDURE: Informed written consent was obtained from the patient after a thorough discussion of the procedural risks, benefits and alternatives. All questions were addressed. Maximal Sterile Barrier Technique was utilized including caps, mask, sterile gowns, sterile gloves, sterile drape, hand hygiene and skin antiseptic. A timeout was performed prior to the initiation of the procedure. Patient position supine on the ultrasound table. Right upper quadrant skin prepped and draped in usual sterile fashion. Following local lidocaine administration, four 18 gauge cores were obtained from the subcapsular right hepatic lesion utilizing continuous ultrasound guidance. Samples were sent to pathology in formalin. Needle removed and hemostasis achieved with 5 minutes of manual compression. Post procedure ultrasound images showed no evidence of significant hemorrhage. IMPRESSION: Ultrasound-guided biopsy of right liver lesion. Electronically Signed   By: Miachel Roux M.D.   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  Stools have become bloody/mucousy lately. - She also reported pain in the tailbone region since March 2023.  She also has right-sided lower back pain. - 50 pound weight loss in the last 9 months, part of weight loss was intentional.  She cut back on eating sweets and lost taste to sweets after COVID infection. - CT [facility] with contrast on 03/08/2022: Irregular circumferential masslike wall thickening of sigmoid colon/rectum with adjacent perirectal adenopathy.  Multiple small hypodense lesions in the liver, largest 2.9 cm in the central aspect of the liver, question metastatic disease. - Colonoscopy on 03/21/2022 by Dr. Abbey Chatters: Fungating infiltrative nearly completely obstructing mass in the rectosigmoid colon, mass was circumferential measuring 4 cm in length. - Pathology: Rectal mass biopsy consistent with invasive moderately differentiated adenocarcinoma.  As there is very scant invasive tumor, MSI studies were deferred. - PET scan (03/23/2022): Hypermetabolic rectal primary long-segment with SUV 14.3.  Left posterior perirectal lymph node 7 mm with SUV 2.9.  Multiple tiny foci of hepatic hypermetabolism. - MRI of the liver (04/01/2022): Multiple small hypovascular rim-enhancing liver lesions, predominantly in the right hepatic lobe measuring up to 1.1 cm.  3.3 cm hypervascular mass in the central liver, most consistent with FNH/hepatic adenoma. - Liver biopsy (04/20/2022): Metastatic moderately differentiated colonic adenocarcinoma with mucinous features    Social/family history: - She is separated and is seen today with her daughter.  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  She is current active smoker, 1 pack/day for 27 years.  Denies drinking alcohol. -  Paternal aunt had colon cancer.  Maternal cousin has breast cancer.  Maternal uncle had leukemia and maternal cousin had leukemia.     PLAN:  Metastatic rectal cancer to the liver: - She was  evaluated by Dr. Marcello Moores and was recommended diverting colostomy. - I have reviewed MRI of the liver which showed multiple small metastatic lesions in the right lobe of the liver. - I have also reviewed the liver biopsy results which are consistent with metastatic rectal cancer. - I have recommended [facility] NGS testing to know MSI status, RAS and BRAF testing. - I agree with recommendations by Dr. Marcello Moores.  I have strongly recommended her to undergo diverting colostomy and port placement. - I have recommended palliative chemotherapy with either FOLFOX or FOLFOXIRI.  I would not add bevacizumab during first cycle due to potential bleeding risk.  If there is no RAS mutation, will consider adding panitumumab. - I have recommended chemotherapy for 3 months followed by repeat imaging. - We have given literature about chemotherapeutic agents. - I would also recommend genetic testing given her young age at diagnosis and family history. - She will initiate chemotherapy after port placement.  2.  Sacral/right lower back pain: - She reports that sacral pain has moved to the perineal region. - She is taking ibuprofen 1000 mg every 6 hours which is helping pain. - I am reluctant to give her narcotics as it may cause obstruction.   Orders placed this encounter:  No orders of the defined types were placed in this encounter.    Derek Jack, MD [place] [phone]   I, Thana Ates, am acting as a scribe for Dr. Derek Jack.  I, Derek Jack MD, have reviewed the above documentation for accuracy and completeness, and I agree with the above.

## 2022-05-10 ENCOUNTER — Encounter (HOSPITAL_COMMUNITY): Payer: Self-pay

## 2022-05-10 NOTE — Progress Notes (Signed)
Call placed to [facility] regarding surgery schedule. Detailed VM left for Caryl Pina, surgery scheduler to call me back.

## 2022-05-12 ENCOUNTER — Other Ambulatory Visit (HOSPITAL_COMMUNITY): Payer: Self-pay

## 2022-05-15 NOTE — Progress Notes (Signed)
DUE TO COVID-19 ONLY  2 VISITOR IS ALLOWED TO COME WITH YOU AND STAY IN THE WAITING ROOM ONLY DURING PRE OP AND PROCEDURE DAY OF SURGERY.  4 VISITOR  MAY VISIT WITH YOU AFTER SURGERY IN YOUR PRIVATE ROOM DURING VISITING HOURS ONLY! YOU MAY HAVE ONE PERSON SPEND THE NITE WITH YOU IN YOUR ROOM AFTER SURGERY.       Your procedure is scheduled on:             05/19/2022   Report to [facility] Main  Entrance   Report to admitting at     53           AM DO NOT BRING INSURANCE CARD, PICTURE ID OR WALLET DAY OF SURGERY.      Call this number if you have problems the morning of surgery [phone]   CLEAR LIQUID DIET ON DAY OF BOWEL PREP FOLLOW BOWEL PREP INSTRUCTIONS PER MD    REMEMBER: NO  SOLID FOODS , CANDY, GUM OR MINTS AFTER [place] .    COMPLETE 2 ENSURE PRESURGERY DIRNKS AT 1000PM THE NITE BEFORE SURGERY.      Marland Kitchen CLEAR LIQUIDS UNTIL     1030 AM          DAY OF SURGERY.      PLEASE FINISH ENSURE DRINK PER SURGEON ORDER  WHICH NEEDS TO BE COMPLETED AT  1030AM         MORNING OF SURGERY.       CLEAR LIQUID DIET   Foods Allowed      WATER BLACK COFFEE ( SUGAR OK, NO MILK, CREAM OR CREAMER) REGULAR AND DECAF  TEA ( SUGAR OK NO MILK, CREAM, OR CREAMER) REGULAR AND DECAF  PLAIN JELLO ( NO RED)  FRUIT ICES ( NO RED, NO FRUIT PULP)  POPSICLES ( NO RED)  JUICE- APPLE, WHITE GRAPE AND WHITE CRANBERRY  SPORT DRINK LIKE GATORADE ( NO RED)  CLEAR BROTH ( VEGETABLE , CHICKEN OR BEEF)                                                                     BRUSH YOUR TEETH MORNING OF SURGERY AND RINSE YOUR MOUTH OUT, NO CHEWING GUM CANDY OR MINTS.     Take these medicines the morning of surgery with A SIP OF WATER:  NONE    DO NOT TAKE ANY DIABETIC MEDICATIONS DAY OF YOUR SURGERY                               You may not have any metal on your body including hair pins and              piercings  Do not wear jewelry, make-up, lotions, powders or perfumes,  deodorant             Do not wear nail polish on your fingernails.              IF YOU ARE A FEMALE AND WANT TO SHAVE UNDER ARMS OR LEGS PRIOR TO SURGERY YOU MUST DO SO AT LEAST 48 HOURS PRIOR TO SURGERY.  Men may shave face and neck.   Do not bring valuables to the hospital. [place].  Contacts, dentures or bridgework may not be worn into surgery.  Leave suitcase in the car. After surgery it may be brought to your room.     Patients discharged the day of surgery will not be allowed to drive home. IF YOU ARE HAVING SURGERY AND GOING HOME THE SAME DAY, YOU MUST HAVE AN ADULT TO DRIVE YOU HOME AND BE WITH YOU FOR 24 HOURS. YOU MAY GO HOME BY TAXI OR UBER OR ORTHERWISE, BUT AN ADULT MUST ACCOMPANY YOU HOME AND STAY WITH YOU FOR 24 HOURS.                Please read over the following fact sheets you were given: _____________________________________________________________________  [facility] - Preparing for Surgery Before surgery, you can play an important role.  Because skin is not sterile, your skin needs to be as free of germs as possible.  You can reduce the number of germs on your skin by washing with CHG (chlorahexidine gluconate) soap before surgery.  CHG is an antiseptic cleaner which kills germs and bonds with the skin to continue killing germs even after washing. Please DO NOT use if you have an allergy to CHG or antibacterial soaps.  If your skin becomes reddened/irritated stop using the CHG and inform your nurse when you arrive at Short Stay. Do not shave (including legs and underarms) for at least 48 hours prior to the first CHG shower.  You may shave your face/neck. Please follow these instructions carefully:  1.  Shower with CHG Soap the night before surgery and the  morning of Surgery.  2.  If you choose to wash your hair, wash your hair first as usual with your  normal  shampoo.  3.  After you shampoo, rinse your hair  and body thoroughly to remove the  shampoo.                           4.  Use CHG as you would any other liquid soap.  You can apply chg directly  to the skin and wash                       Gently with a scrungie or clean washcloth.  5.  Apply the CHG Soap to your body ONLY FROM THE NECK DOWN.   Do not use on face/ open                           Wound or open sores. Avoid contact with eyes, ears mouth and genitals (private parts).                       Wash face,  Genitals (private parts) with your normal soap.             6.  Wash thoroughly, paying special attention to the area where your surgery  will be performed.  7.  Thoroughly rinse your body with warm water from the neck down.  8.  DO NOT shower/wash with your normal soap after using and rinsing off  the CHG Soap.                9.  Pat yourself dry with a clean towel.            10.  Wear clean pajamas.            11.  Place clean sheets on your bed the night of your first shower and do not  sleep with pets. Day of Surgery : Do not apply any lotions/deodorants the morning of surgery.  Please wear clean clothes to the hospital/surgery center.  FAILURE TO FOLLOW THESE INSTRUCTIONS MAY RESULT IN THE CANCELLATION OF YOUR SURGERY PATIENT SIGNATURE_________________________________  NURSE SIGNATURE__________________________________  ________________________________________________________________________

## 2022-05-15 NOTE — Progress Notes (Signed)
Anesthesia Review:  PCP: Cardiologist : Chest x-ray : EKG : Echo : Stress test: Cardiac Cath :  Activity level:  Sleep Study/ CPAP : Fasting Blood Sugar :      / Checks Blood Sugar -- times a day:   Blood Thinner/ Instructions /Last Dose: ASA / Instructions/ Last Dose :  

## 2022-05-17 ENCOUNTER — Other Ambulatory Visit: Payer: Self-pay

## 2022-05-17 ENCOUNTER — Encounter (HOSPITAL_COMMUNITY): Payer: Self-pay

## 2022-05-17 ENCOUNTER — Encounter (HOSPITAL_COMMUNITY)
Admission: RE | Admit: 2022-05-17 | Discharge: 2022-05-17 | Disposition: A | Discharge status: Home / Self Care | Payer: BC Managed Care – PPO | Source: Ambulatory Visit | Attending: General Surgery | Admitting: General Surgery

## 2022-05-17 DIAGNOSIS — Z01818 Encounter for other preprocedural examination: Secondary | ICD-10-CM

## 2022-05-17 DIAGNOSIS — Z01812 Encounter for preprocedural laboratory examination: Secondary | ICD-10-CM | POA: Insufficient documentation

## 2022-05-17 HISTORY — DX: Malignant (primary) neoplasm, unspecified: C80.1

## 2022-05-17 LAB — COMPREHENSIVE METABOLIC PANEL
ALT: 18 U/L (ref 0–44)
AST: 16 U/L (ref 15–41)
Albumin: 4 g/dL (ref 3.5–5.0)
Alkaline Phosphatase: 42 U/L (ref 38–126)
Anion gap: 5 (ref 5–15)
BUN: 11 mg/dL (ref 6–20)
CO2: 24 mmol/L (ref 22–32)
Calcium: 9.3 mg/dL (ref 8.9–10.3)
Chloride: 111 mmol/L (ref 98–111)
Creatinine, Ser: 0.55 mg/dL (ref 0.44–1.00)
GFR, Estimated: >60 mL/min (ref >60)
Glucose, Bld: 121 mg/dL — ABNORMAL HIGH (ref 70–99)
Potassium: 3.6 mmol/L (ref 3.5–5.1)
Sodium: 140 mmol/L (ref 135–145)
Total Bilirubin: 0.6 mg/dL (ref 0.3–1.2)
Total Protein: 6.7 g/dL (ref 6.5–8.1)

## 2022-05-17 LAB — CBC
HCT: 36.3 % (ref 36.0–46.0)
Hemoglobin: 12.1 g/dL (ref 12.0–15.0)
MCH: 31.9 pg (ref 26.0–34.0)
MCHC: 33.3 g/dL (ref 30.0–36.0)
MCV: 95.8 fL (ref 80.0–100.0)
Platelets: 345 10*3/uL (ref 150–400)
RBC: 3.79 MIL/uL — ABNORMAL LOW (ref 3.87–5.11)
RDW: 12.3 % (ref 11.5–15.5)
WBC: 8.9 10*3/uL (ref 4.0–10.5)
nRBC: 0 % (ref 0.0–0.2)

## 2022-05-17 NOTE — Consult Note (Signed)
[place] Nurse Consult Note [place] Nurse requested for preoperative stoma site marking.  Discussed surgical procedure and stoma creation with patient.  Explained role of the [place] nurse team. Answered patient and questions. Referred patient's questions about how long she was going to be off work to her Psychologist, sport and exercise.   Examined patient sitting and standing in order to place the marking in the patient's visual field, away from any creases or abdominal contour issues and within the rectus muscle.     Marked for colostomy in the LLQ  5cm to the left of the umbilicus and 2cm below the umbilicus.  Marked for ileostomy in the RLQ  5.5cm to the right of the umbilicus and  3.5KK below the umbilicus.  Patient's abdomen cleansed with CHG wipes at site markings, allowed to air dry prior to marking.Covered marks with thin film transparent dressing to preserve mark until date of surgery.   [place] Nurse team will follow up with patient after surgery for continue ostomy care and teaching should a stoma be created interoperatively.  Please reconsult if that is the case.    Thank you for inviting [place] to participate in this patient's Plan of Care.  Maudie Flakes, MSN, RN, CNS, [place], Serita Grammes, [employer], [employer] phone:  [phone]

## 2022-05-18 NOTE — Anesthesia Preprocedure Evaluation (Addendum)
Anesthesia Evaluation  Patient identified by MRN, date of birth, ID band Patient awake    Reviewed: Allergy & Precautions, NPO status , Patient's Chart, lab work & pertinent test results  Airway Mallampati: II  TM Distance: >3 FB Neck ROM: Full    Dental no notable dental hx. (+) Teeth Intact, Dental Advisory Given   Pulmonary Current Smoker,    Pulmonary exam normal breath sounds clear to auscultation       Cardiovascular Exercise Tolerance: Good Normal cardiovascular exam Rhythm:Regular Rate:Normal     Neuro/Psych negative psych ROS   GI/Hepatic Neg liver ROS,   Endo/Other    Renal/GU negative Renal ROSLab Results      Component                Value               Date                      CREATININE               0.55                05/17/2022                BUN                      11                  05/17/2022                NA                       140                 05/17/2022                K                        3.6                 05/17/2022                        Musculoskeletal   Abdominal   Peds  Hematology Lab Results      Component                Value               Date                      WBC                      8.9                 05/17/2022                HGB                      12.1                05/17/2022                HCT                      36.3  05/17/2022                      PLT                      345                 05/17/2022              Anesthesia Other Findings CHY:[id number]  Reproductive/Obstetrics                            Anesthesia Physical Anesthesia Plan  ASA: 2  Anesthesia Plan: General   Post-op Pain Management: Lidocaine infusion*, Ketamine IV* and Toradol IV (intra-op)*   Induction: Intravenous  PONV Risk Score and Plan: Treatment may vary due to age or medical condition, Ondansetron, Midazolam and  Dexamethasone  Airway Management Planned: Oral ETT  Additional Equipment: None  Intra-op Plan:   Post-operative Plan: Extubation in OR  Informed Consent: I have reviewed the patients History and Physical, chart, labs and discussed the procedure including the risks, benefits and alternatives for the proposed anesthesia with the patient or authorized representative who has indicated his/her understanding and acceptance.     Dental advisory given  Plan Discussed with:   Anesthesia Plan Comments:        Anesthesia Quick Evaluation

## 2022-05-19 ENCOUNTER — Other Ambulatory Visit: Payer: Self-pay

## 2022-05-19 ENCOUNTER — Inpatient Hospital Stay (HOSPITAL_COMMUNITY): Payer: BC Managed Care – PPO

## 2022-05-19 ENCOUNTER — Encounter (HOSPITAL_COMMUNITY): Payer: Self-pay | Admitting: General Surgery

## 2022-05-19 ENCOUNTER — Inpatient Hospital Stay (HOSPITAL_COMMUNITY)
Admission: RE | Admit: 2022-05-19 | Discharge: 2022-05-21 | DRG: 330 | Disposition: A | Discharge status: Home / Self Care | Payer: BC Managed Care – PPO | Source: Ambulatory Visit | Attending: General Surgery | Admitting: General Surgery

## 2022-05-19 ENCOUNTER — Encounter (HOSPITAL_COMMUNITY)
Admission: RE | Disposition: A | Discharge status: Home / Self Care | Payer: Self-pay | Source: Ambulatory Visit | Attending: General Surgery

## 2022-05-19 ENCOUNTER — Inpatient Hospital Stay (HOSPITAL_COMMUNITY): Payer: BC Managed Care – PPO | Admitting: Certified Registered Nurse Anesthetist

## 2022-05-19 DIAGNOSIS — Z888 Allergy status to other drugs, medicaments and biological substances status: Secondary | ICD-10-CM | POA: Diagnosis not present

## 2022-05-19 DIAGNOSIS — Z823 Family history of stroke: Secondary | ICD-10-CM | POA: Diagnosis not present

## 2022-05-19 DIAGNOSIS — Z8249 Family history of ischemic heart disease and other diseases of the circulatory system: Secondary | ICD-10-CM | POA: Diagnosis not present

## 2022-05-19 DIAGNOSIS — F17210 Nicotine dependence, cigarettes, uncomplicated: Secondary | ICD-10-CM | POA: Diagnosis present

## 2022-05-19 DIAGNOSIS — C20 Malignant neoplasm of rectum: Principal | ICD-10-CM | POA: Diagnosis present

## 2022-05-19 DIAGNOSIS — Z01818 Encounter for other preprocedural examination: Principal | ICD-10-CM

## 2022-05-19 DIAGNOSIS — C787 Secondary malignant neoplasm of liver and intrahepatic bile duct: Secondary | ICD-10-CM | POA: Diagnosis present

## 2022-05-19 DIAGNOSIS — Z833 Family history of diabetes mellitus: Secondary | ICD-10-CM

## 2022-05-19 DIAGNOSIS — Z800 Family history of malignant neoplasm of digestive organs: Secondary | ICD-10-CM

## 2022-05-19 HISTORY — PX: PORTACATH PLACEMENT: SHX2246

## 2022-05-19 HISTORY — PX: LAPAROSCOPIC DIVERTED COLOSTOMY: SHX5892

## 2022-05-19 HISTORY — PX: FLEXIBLE SIGMOIDOSCOPY: SHX5431

## 2022-05-19 LAB — ABO/RH: ABO/RH(D): A POS

## 2022-05-19 LAB — PREGNANCY, URINE: Preg Test, Ur: NEGATIVE

## 2022-05-19 LAB — TYPE AND SCREEN
ABO/RH(D): A POS
Antibody Screen: NEGATIVE

## 2022-05-19 SURGERY — CREATION, COLOSTOMY, DIVERTING, LAPAROSCOPIC
Anesthesia: General | Site: Rectum | Laterality: Right

## 2022-05-19 MED ORDER — LACTATED RINGERS IV SOLN: INTRAVENOUS | Status: DC

## 2022-05-19 MED ORDER — SIMETHICONE 80 MG PO CHEW
40.0000 mg | CHEWABLE_TABLET | Freq: Four times a day (QID) | ORAL | Status: DC | PRN
  Administered 2022-05-20: 40 mg via ORAL
  Filled 2022-05-19: qty 1

## 2022-05-19 MED ORDER — HEPARIN SOD (PORK) LOCK FLUSH 100 UNIT/ML IV SOLN
INTRAVENOUS | Status: DC | PRN
  Administered 2022-05-19: 500 [IU] via INTRAVENOUS

## 2022-05-19 MED ORDER — PHENYLEPHRINE 80 MCG/ML (10ML) SYRINGE FOR IV PUSH (FOR BLOOD PRESSURE SUPPORT)
PREFILLED_SYRINGE | INTRAVENOUS | Status: AC
  Filled 2022-05-19: qty 10

## 2022-05-19 MED ORDER — HYDROMORPHONE HCL 1 MG/ML IJ SOLN
0.2500 mg | INTRAMUSCULAR | Status: DC | PRN
  Administered 2022-05-19: 0.5 mg via INTRAVENOUS

## 2022-05-19 MED ORDER — KETAMINE HCL 10 MG/ML IJ SOLN
INTRAMUSCULAR | Status: AC
  Filled 2022-05-19: qty 1

## 2022-05-19 MED ORDER — BUPIVACAINE-EPINEPHRINE 0.25% -1:200000 IJ SOLN
INTRAMUSCULAR | Status: DC | PRN
  Administered 2022-05-19: 30 mL

## 2022-05-19 MED ORDER — PHENYLEPHRINE HCL-NACL 20-0.9 MG/250ML-% IV SOLN
INTRAVENOUS | Status: DC | PRN
  Administered 2022-05-19: 50 ug/min via INTRAVENOUS

## 2022-05-19 MED ORDER — ENSURE PRE-SURGERY PO LIQD
592.0000 mL | Freq: Once | ORAL | Status: DC
  Filled 2022-05-19: qty 592

## 2022-05-19 MED ORDER — ENSURE PRE-SURGERY PO LIQD
296.0000 mL | Freq: Once | ORAL | Status: DC
  Filled 2022-05-19: qty 296

## 2022-05-19 MED ORDER — ALVIMOPAN 12 MG PO CAPS
12.0000 mg | ORAL_CAPSULE | ORAL | Status: AC
  Administered 2022-05-19: 12 mg via ORAL
  Filled 2022-05-19: qty 1

## 2022-05-19 MED ORDER — PHENYLEPHRINE 80 MCG/ML (10ML) SYRINGE FOR IV PUSH (FOR BLOOD PRESSURE SUPPORT)
PREFILLED_SYRINGE | INTRAVENOUS | Status: DC | PRN
  Administered 2022-05-19 (×2): 80 ug via INTRAVENOUS

## 2022-05-19 MED ORDER — KETAMINE HCL 10 MG/ML IJ SOLN
INTRAMUSCULAR | Status: DC | PRN
  Administered 2022-05-19: 30 mg via INTRAVENOUS

## 2022-05-19 MED ORDER — ACETAMINOPHEN 500 MG PO TABS
1000.0000 mg | ORAL_TABLET | Freq: Four times a day (QID) | ORAL | Status: DC
  Administered 2022-05-19 – 2022-05-21 (×7): 1000 mg via ORAL
  Filled 2022-05-19 (×7): qty 2

## 2022-05-19 MED ORDER — KETOROLAC TROMETHAMINE 30 MG/ML IJ SOLN
INTRAMUSCULAR | Status: DC | PRN
  Administered 2022-05-19: 15 mg via INTRAVENOUS

## 2022-05-19 MED ORDER — OXYCODONE HCL 5 MG PO TABS: 5.0000 mg | ORAL_TABLET | Freq: Once | ORAL | Status: DC | PRN

## 2022-05-19 MED ORDER — ONDANSETRON HCL 4 MG/2ML IJ SOLN: 4.0000 mg | Freq: Four times a day (QID) | INTRAMUSCULAR | Status: DC | PRN

## 2022-05-19 MED ORDER — BUPIVACAINE-EPINEPHRINE (PF) 0.25% -1:200000 IJ SOLN
INTRAMUSCULAR | Status: AC
  Filled 2022-05-19: qty 30

## 2022-05-19 MED ORDER — FENTANYL CITRATE (PF) 100 MCG/2ML IJ SOLN
INTRAMUSCULAR | Status: AC
  Filled 2022-05-19: qty 2

## 2022-05-19 MED ORDER — OXYCODONE HCL 5 MG/5ML PO SOLN: 5.0000 mg | Freq: Once | ORAL | Status: DC | PRN

## 2022-05-19 MED ORDER — PHENYLEPHRINE 80 MCG/ML (10ML) SYRINGE FOR IV PUSH (FOR BLOOD PRESSURE SUPPORT)
PREFILLED_SYRINGE | INTRAVENOUS | Status: AC
Start: 2022-05-19 — End: ?
  Filled 2022-05-19: qty 20

## 2022-05-19 MED ORDER — SACCHAROMYCES BOULARDII 250 MG PO CAPS
250.0000 mg | ORAL_CAPSULE | Freq: Two times a day (BID) | ORAL | Status: DC
  Administered 2022-05-19 – 2022-05-21 (×4): 250 mg via ORAL
  Filled 2022-05-19 (×4): qty 1

## 2022-05-19 MED ORDER — BUPIVACAINE LIPOSOME 1.3 % IJ SUSP
INTRAMUSCULAR | Status: DC | PRN
  Administered 2022-05-19: 20 mL

## 2022-05-19 MED ORDER — ENSURE SURGERY PO LIQD
237.0000 mL | Freq: Two times a day (BID) | ORAL | Status: DC
Start: 2022-05-20 — End: 2022-05-21
  Administered 2022-05-20 (×2): 237 mL via ORAL

## 2022-05-19 MED ORDER — ORAL CARE MOUTH RINSE: 15.0000 mL | Freq: Once | OROMUCOSAL | Status: AC

## 2022-05-19 MED ORDER — GABAPENTIN 300 MG PO CAPS
300.0000 mg | ORAL_CAPSULE | ORAL | Status: AC
  Administered 2022-05-19: 300 mg via ORAL
  Filled 2022-05-19: qty 1

## 2022-05-19 MED ORDER — ROCURONIUM BROMIDE 10 MG/ML (PF) SYRINGE
PREFILLED_SYRINGE | INTRAVENOUS | Status: DC | PRN
  Administered 2022-05-19: 10 mg via INTRAVENOUS
  Administered 2022-05-19: 70 mg via INTRAVENOUS

## 2022-05-19 MED ORDER — KETOROLAC TROMETHAMINE 30 MG/ML IJ SOLN
INTRAMUSCULAR | Status: AC
Start: 2022-05-19 — End: ?
  Filled 2022-05-19: qty 2

## 2022-05-19 MED ORDER — BUPIVACAINE LIPOSOME 1.3 % IJ SUSP: 20.0000 mL | Freq: Once | INTRAMUSCULAR | Status: DC

## 2022-05-19 MED ORDER — HEPARIN SOD (PORK) LOCK FLUSH 100 UNIT/ML IV SOLN
INTRAVENOUS | Status: AC
  Filled 2022-05-19: qty 5

## 2022-05-19 MED ORDER — BUPIVACAINE LIPOSOME 1.3 % IJ SUSP
INTRAMUSCULAR | Status: AC
  Filled 2022-05-19: qty 20

## 2022-05-19 MED ORDER — SPOT INK MARKER SYRINGE KIT
PACK | SUBMUCOSAL | Status: AC
Start: 2022-05-19 — End: ?
  Filled 2022-05-19: qty 5

## 2022-05-19 MED ORDER — HEPARIN 6000 UNIT IRRIGATION SOLUTION
Freq: Once | Status: AC
  Administered 2022-05-19: 1
  Filled 2022-05-19: qty 6000

## 2022-05-19 MED ORDER — ONDANSETRON HCL 4 MG PO TABS: 4.0000 mg | ORAL_TABLET | Freq: Four times a day (QID) | ORAL | Status: DC | PRN

## 2022-05-19 MED ORDER — KCL IN DEXTROSE-NACL 20-5-0.45 MEQ/L-%-% IV SOLN
INTRAVENOUS | Status: DC
  Filled 2022-05-19 (×2): qty 1000

## 2022-05-19 MED ORDER — LIDOCAINE HCL 2 % IJ SOLN
INTRAMUSCULAR | Status: AC
  Filled 2022-05-19: qty 40

## 2022-05-19 MED ORDER — ONDANSETRON HCL 4 MG/2ML IJ SOLN
INTRAMUSCULAR | Status: DC | PRN
  Administered 2022-05-19: 4 mg via INTRAVENOUS

## 2022-05-19 MED ORDER — ONDANSETRON HCL 4 MG/2ML IJ SOLN: 4.0000 mg | Freq: Once | INTRAMUSCULAR | Status: DC | PRN

## 2022-05-19 MED ORDER — MIDAZOLAM HCL 2 MG/2ML IJ SOLN
INTRAMUSCULAR | Status: AC
  Filled 2022-05-19: qty 2

## 2022-05-19 MED ORDER — HYDROMORPHONE HCL 1 MG/ML IJ SOLN
0.5000 mg | INTRAMUSCULAR | Status: DC | PRN
  Administered 2022-05-19 – 2022-05-21 (×4): 0.5 mg via INTRAVENOUS
  Filled 2022-05-19 (×4): qty 0.5

## 2022-05-19 MED ORDER — SODIUM CHLORIDE 0.9 % IV SOLN
2.0000 g | INTRAVENOUS | Status: AC
  Administered 2022-05-19: 2 g via INTRAVENOUS
  Filled 2022-05-19: qty 2

## 2022-05-19 MED ORDER — CHLORHEXIDINE GLUCONATE 0.12 % MT SOLN
15.0000 mL | Freq: Once | OROMUCOSAL | Status: AC
  Administered 2022-05-19: 15 mL via OROMUCOSAL

## 2022-05-19 MED ORDER — ALUM & MAG HYDROXIDE-SIMETH 200-200-20 MG/5ML PO SUSP: 30.0000 mL | Freq: Four times a day (QID) | ORAL | Status: DC | PRN

## 2022-05-19 MED ORDER — POLYETHYLENE GLYCOL 3350 17 GM/SCOOP PO POWD: 1.0000 | Freq: Once | ORAL | Status: DC

## 2022-05-19 MED ORDER — LIDOCAINE 2% (20 MG/ML) 5 ML SYRINGE
INTRAMUSCULAR | Status: DC | PRN
  Administered 2022-05-19: 100 mg via INTRAVENOUS

## 2022-05-19 MED ORDER — ALVIMOPAN 12 MG PO CAPS
12.0000 mg | ORAL_CAPSULE | Freq: Two times a day (BID) | ORAL | Status: DC
  Administered 2022-05-20 (×2): 12 mg via ORAL
  Filled 2022-05-19 (×2): qty 1

## 2022-05-19 MED ORDER — SUGAMMADEX SODIUM 200 MG/2ML IV SOLN
INTRAVENOUS | Status: DC | PRN
  Administered 2022-05-19: 200 mg via INTRAVENOUS

## 2022-05-19 MED ORDER — DIPHENHYDRAMINE HCL 50 MG/ML IJ SOLN: 25.0000 mg | Freq: Four times a day (QID) | INTRAMUSCULAR | Status: DC | PRN

## 2022-05-19 MED ORDER — FENTANYL CITRATE (PF) 100 MCG/2ML IJ SOLN
INTRAMUSCULAR | Status: DC | PRN
Start: 2022-05-19 — End: 2022-05-19
  Administered 2022-05-19: 50 ug via INTRAVENOUS
  Administered 2022-05-19 (×2): 25 ug via INTRAVENOUS
  Administered 2022-05-19: 50 ug via INTRAVENOUS

## 2022-05-19 MED ORDER — PHENYLEPHRINE HCL-NACL 20-0.9 MG/250ML-% IV SOLN
INTRAVENOUS | Status: AC
  Filled 2022-05-19: qty 250

## 2022-05-19 MED ORDER — PROPOFOL 10 MG/ML IV BOLUS
INTRAVENOUS | Status: DC | PRN
  Administered 2022-05-19: 180 mg via INTRAVENOUS

## 2022-05-19 MED ORDER — KETOROLAC TROMETHAMINE 30 MG/ML IJ SOLN: 30.0000 mg | Freq: Once | INTRAMUSCULAR | Status: DC | PRN

## 2022-05-19 MED ORDER — 0.9 % SODIUM CHLORIDE (POUR BTL) OPTIME
TOPICAL | Status: DC | PRN
  Administered 2022-05-19 (×2): 1000 mL

## 2022-05-19 MED ORDER — ENOXAPARIN SODIUM 40 MG/0.4ML IJ SOSY
40.0000 mg | PREFILLED_SYRINGE | INTRAMUSCULAR | Status: DC
  Administered 2022-05-20 – 2022-05-21 (×2): 40 mg via SUBCUTANEOUS
  Filled 2022-05-19 (×2): qty 0.4

## 2022-05-19 MED ORDER — LIDOCAINE HCL (PF) 2 % IJ SOLN
INTRAMUSCULAR | Status: DC | PRN
  Administered 2022-05-19: 1.5 mg/kg/h via INTRADERMAL

## 2022-05-19 MED ORDER — SODIUM CHLORIDE 0.9 % IV SOLN
2.0000 g | Freq: Two times a day (BID) | INTRAVENOUS | Status: AC
  Administered 2022-05-20: 2 g via INTRAVENOUS
  Filled 2022-05-19: qty 2

## 2022-05-19 MED ORDER — DIPHENHYDRAMINE HCL 25 MG PO CAPS: 25.0000 mg | ORAL_CAPSULE | Freq: Four times a day (QID) | ORAL | Status: DC | PRN

## 2022-05-19 MED ORDER — DEXAMETHASONE SODIUM PHOSPHATE 10 MG/ML IJ SOLN
INTRAMUSCULAR | Status: DC | PRN
  Administered 2022-05-19: 5 mg via INTRAVENOUS

## 2022-05-19 MED ORDER — BISACODYL 5 MG PO TBEC: 20.0000 mg | DELAYED_RELEASE_TABLET | Freq: Once | ORAL | Status: DC

## 2022-05-19 MED ORDER — ACETAMINOPHEN 500 MG PO TABS
1000.0000 mg | ORAL_TABLET | ORAL | Status: AC
  Administered 2022-05-19: 1000 mg via ORAL
  Filled 2022-05-19: qty 2

## 2022-05-19 MED ORDER — MIDAZOLAM HCL 5 MG/5ML IJ SOLN
INTRAMUSCULAR | Status: DC | PRN
  Administered 2022-05-19: 2 mg via INTRAVENOUS

## 2022-05-19 MED ORDER — GABAPENTIN 300 MG PO CAPS
300.0000 mg | ORAL_CAPSULE | Freq: Two times a day (BID) | ORAL | Status: DC
  Administered 2022-05-19 – 2022-05-21 (×4): 300 mg via ORAL
  Filled 2022-05-19 (×4): qty 1

## 2022-05-19 MED ORDER — HYDROMORPHONE HCL 1 MG/ML IJ SOLN
INTRAMUSCULAR | Status: AC
  Administered 2022-05-19: 0.5 mg via INTRAVENOUS
  Filled 2022-05-19: qty 2

## 2022-05-19 SURGICAL SUPPLY — 87 items
APPLIER CLIP 5 13 M/L LIGAMAX5 (MISCELLANEOUS)
BAG COUNTER SPONGE SURGICOUNT (BAG) ×4 IMPLANT
BAG DECANTER FOR FLEXI CONT (MISCELLANEOUS) ×4 IMPLANT
BLADE EXTENDED COATED 6.5IN (ELECTRODE) IMPLANT
BLADE SURG 15 STRL LF DISP TIS (BLADE) ×3 IMPLANT
BLADE SURG 15 STRL SS (BLADE) ×1
CABLE HIGH FREQUENCY MONO STRZ (ELECTRODE) IMPLANT
CELLS DAT CNTRL 66122 CELL SVR (MISCELLANEOUS) IMPLANT
CHLORAPREP W/TINT 26 (MISCELLANEOUS) ×4 IMPLANT
CLIP APPLIE 5 13 M/L LIGAMAX5 (MISCELLANEOUS) IMPLANT
COVER PROBE U/S 5X48 (MISCELLANEOUS) ×1 IMPLANT
DERMABOND ADVANCED (GAUZE/BANDAGES/DRESSINGS) ×1
DERMABOND ADVANCED .7 DNX12 (GAUZE/BANDAGES/DRESSINGS) ×3 IMPLANT
DRAIN CHANNEL 19F RND (DRAIN) IMPLANT
DRAPE C-ARM 42X120 X-RAY (DRAPES) ×4 IMPLANT
DRAPE LAPAROSCOPIC ABDOMINAL (DRAPES) ×4 IMPLANT
DRAPE LAPAROTOMY TRNSV 102X78 (DRAPES) ×4 IMPLANT
DRAPE SURG IRRIG POUCH 19X23 (DRAPES) ×4 IMPLANT
DRSG OPSITE POSTOP 4X10 (GAUZE/BANDAGES/DRESSINGS) IMPLANT
DRSG OPSITE POSTOP 4X6 (GAUZE/BANDAGES/DRESSINGS) IMPLANT
DRSG OPSITE POSTOP 4X8 (GAUZE/BANDAGES/DRESSINGS) IMPLANT
DRSG TEGADERM 4X4.75 (GAUZE/BANDAGES/DRESSINGS) ×2 IMPLANT
ELECT REM PT RETURN 15FT ADLT (MISCELLANEOUS) ×4 IMPLANT
EVACUATOR SILICONE 100CC (DRAIN) IMPLANT
GAUZE 4X4 16PLY ~~LOC~~+RFID DBL (SPONGE) ×4 IMPLANT
GAUZE SPONGE 4X4 12PLY STRL (GAUZE/BANDAGES/DRESSINGS) ×1 IMPLANT
GLOVE BIO SURGEON STRL SZ 6.5 (GLOVE) ×8 IMPLANT
GLOVE BIOGEL PI IND STRL 7.0 (GLOVE) ×6 IMPLANT
GLOVE BIOGEL PI INDICATOR 7.0 (GLOVE) ×2
GLOVE INDICATOR 6.5 STRL GRN (GLOVE) ×4 IMPLANT
GOWN STRL REUS W/ TWL XL LVL3 (GOWN DISPOSABLE) ×18 IMPLANT
GOWN STRL REUS W/TWL XL LVL3 (GOWN DISPOSABLE) ×6
HOLDER FOLEY CATH W/STRAP (MISCELLANEOUS) ×4 IMPLANT
IRRIG SUCT STRYKERFLOW 2 WTIP (MISCELLANEOUS) ×4
IRRIGATION SUCT STRKRFLW 2 WTP (MISCELLANEOUS) ×3 IMPLANT
KIT BASIN OR (CUSTOM PROCEDURE TRAY) ×4 IMPLANT
KIT PORT POWER 8FR ISP CVUE (Port) ×1 IMPLANT
KIT TURNOVER KIT A (KITS) IMPLANT
NDL CARR LOCKE SCLERO (NEEDLE) IMPLANT
NDL HYPO 25X1 1.5 SAFETY (NEEDLE) ×3 IMPLANT
NDL INSUFFLATION 14GA 150MM (NEEDLE) IMPLANT
NEEDLE CARR LOCKE SCLERO (NEEDLE) ×4 IMPLANT
NEEDLE HYPO 25X1 1.5 SAFETY (NEEDLE) ×4 IMPLANT
NEEDLE INSUFFLATION 14GA 150MM (NEEDLE) ×4 IMPLANT
PACK BASIC VI WITH GOWN DISP (CUSTOM PROCEDURE TRAY) ×4 IMPLANT
PACK COLON (CUSTOM PROCEDURE TRAY) ×4 IMPLANT
PAD POSITIONING PINK XL (MISCELLANEOUS) ×4 IMPLANT
PENCIL SMOKE EVACUATOR (MISCELLANEOUS) IMPLANT
PROTECTOR NERVE ULNAR (MISCELLANEOUS) ×4 IMPLANT
RETRACTOR WND ALEXIS 18 MED (MISCELLANEOUS) IMPLANT
RTRCTR WOUND ALEXIS 18CM MED (MISCELLANEOUS)
SCISSORS LAP 5X35 DISP (ENDOMECHANICALS) ×4 IMPLANT
SEALER TISSUE G2 STRG ARTC 35C (ENDOMECHANICALS) ×4 IMPLANT
SET TUBE SMOKE EVAC HIGH FLOW (TUBING) ×4 IMPLANT
SLEEVE Z-THREAD 5X100MM (TROCAR) ×4 IMPLANT
SPIKE FLUID TRANSFER (MISCELLANEOUS) ×4 IMPLANT
SPONGE DRAIN TRACH 4X4 STRL 2S (GAUZE/BANDAGES/DRESSINGS) IMPLANT
SURGILUBE 2OZ TUBE FLIPTOP (MISCELLANEOUS) ×4 IMPLANT
SUT ETHILON 2 0 PS N (SUTURE) IMPLANT
SUT NOVA NAB DX-16 0-1 5-0 T12 (SUTURE) ×8 IMPLANT
SUT PROLENE 2 0 KS (SUTURE) IMPLANT
SUT PROLENE 2 0 SH DA (SUTURE) ×4 IMPLANT
SUT SILK 2 0 (SUTURE) ×1
SUT SILK 2 0 SH CR/8 (SUTURE) IMPLANT
SUT SILK 2-0 18XBRD TIE 12 (SUTURE) ×3 IMPLANT
SUT SILK 3 0 (SUTURE)
SUT SILK 3 0 SH CR/8 (SUTURE) ×4 IMPLANT
SUT SILK 3-0 18XBRD TIE 12 (SUTURE) IMPLANT
SUT VIC AB 2-0 SH 18 (SUTURE) ×5 IMPLANT
SUT VIC AB 3-0 SH 27 (SUTURE) ×1
SUT VIC AB 3-0 SH 27XBRD (SUTURE) ×3 IMPLANT
SUT VIC AB 4-0 PS2 18 (SUTURE) ×4 IMPLANT
SUT VIC AB 4-0 PS2 27 (SUTURE) ×4 IMPLANT
SUT VICRYL 0 UR6 27IN ABS (SUTURE) ×4 IMPLANT
SYR 10ML LL (SYRINGE) ×4 IMPLANT
SYR CONTROL 10ML LL (SYRINGE) ×4 IMPLANT
SYS LAPSCP GELPORT 120MM (MISCELLANEOUS)
SYS WOUND ALEXIS 18CM MED (MISCELLANEOUS) ×4
SYSTEM LAPSCP GELPORT 120MM (MISCELLANEOUS) IMPLANT
SYSTEM WOUND ALEXIS 18CM MED (MISCELLANEOUS) ×3 IMPLANT
TOWEL OR 17X26 10 PK STRL BLUE (TOWEL DISPOSABLE) ×4 IMPLANT
TOWEL OR NON WOVEN STRL DISP B (DISPOSABLE) ×4 IMPLANT
TRAY FOLEY MTR SLVR 16FR STAT (SET/KITS/TRAYS/PACK) IMPLANT
TROCAR BALLN 12MMX100 BLUNT (TROCAR) IMPLANT
TROCAR Z THREAD OPTICAL 12X100 (TROCAR) ×1 IMPLANT
TROCAR Z-THREAD OPTICAL 5X100M (TROCAR) ×4 IMPLANT
TUBING CONNECTING 10 (TUBING) ×4 IMPLANT

## 2022-05-19 NOTE — Consult Note (Signed)
WOC marked patient preoperatively in PAT visit 05/17/22.   Will follow up post op for stoma care needs if any.  [place], [place], [place]

## 2022-05-19 NOTE — Anesthesia Procedure Notes (Signed)
Procedure Name: Intubation Date/Time: 05/19/2022 1:54 PM  Performed by: West Pugh, CRNAPre-anesthesia Checklist: Patient identified, Emergency Drugs available, Suction available, Patient being monitored and Timeout performed Patient Re-evaluated:Patient Re-evaluated prior to induction Oxygen Delivery Method: Circle system utilized Preoxygenation: Pre-oxygenation with 100% oxygen Induction Type: IV induction Ventilation: Mask ventilation without difficulty Laryngoscope Size: Mac and 3 Grade View: Grade I Tube type: Oral Tube size: 7.0 mm Number of attempts: 1 Airway Equipment and Method: Stylet Placement Confirmation: ETT inserted through vocal cords under direct vision, positive ETCO2, CO2 detector and breath sounds checked- equal and bilateral Secured at: 22 cm Tube secured with: Tape Dental Injury: Teeth and Oropharynx as per pre-operative assessment

## 2022-05-19 NOTE — Discharge Instructions (Signed)
SURGERY: POST OP INSTRUCTIONS (Surgery for small bowel obstruction, colon resection, etc)   ######################################################################  EAT Gradually transition to a high fiber diet with a fiber supplement over the next few days after discharge  WALK Walk an hour a day.  Control your pain to do that.    CONTROL PAIN Control pain so that you can walk, sleep, tolerate sneezing/coughing, go up/down stairs.  HAVE A BOWEL MOVEMENT DAILY Keep your bowels regular to avoid problems.  OK to try a laxative to override constipation.  OK to use an antidairrheal to slow down diarrhea.  Call if not better after 2 tries  CALL IF YOU HAVE PROBLEMS/CONCERNS Call if you are still struggling despite following these instructions. Call if you have concerns not answered by these instructions  ######################################################################   DIET Follow a light diet the first few days at home.  Start with a bland diet such as soups, liquids, starchy foods, low fat foods, etc.  If you feel full, bloated, or constipated, stay on a ful liquid or pureed/blenderized diet for a few days until you feel better and no longer constipated. Be sure to drink plenty of fluids every day to avoid getting dehydrated (feeling dizzy, not urinating, etc.). Gradually add a fiber supplement to your diet over the next week.  Gradually get back to a regular solid diet.  Avoid fast food or heavy meals the first week as you are more likely to get nauseated. It is expected for your digestive tract to need a few months to get back to normal.  It is common for your bowel movements and stools to be irregular.  You will have occasional bloating and cramping that should eventually fade away.  Until you are eating solid food normally, off all pain medications, and back to regular activities; your bowels will not be normal. Focus on eating a low-fat, high fiber diet the rest of your life  (See Getting to Good Bowel Health, below).  CARE of your INCISION or WOUND  It is good for closed incisions and even open wounds to be washed every day.  Shower every day.  Short baths are fine.  Wash the incisions and wounds clean with soap & water.    You may leave closed incisions open to air if it is dry.   You may cover the incision with clean gauze & replace it after your daily shower for comfort.  STAPLES: You have skin staples.  Leave them in place & set up an appointment for them to be removed by a surgery office nurse ~10 days after surgery. = 1st week of January 2024    ACTIVITIES as tolerated Start light daily activities --- self-care, walking, climbing stairs-- beginning the day after surgery.  Gradually increase activities as tolerated.  Control your pain to be active.  Stop when you are tired.  Ideally, walk several times a day, eventually an hour a day.   Most people are back to most day-to-day activities in a few weeks.  It takes 4-8 weeks to get back to unrestricted, intense activity. If you can walk 30 minutes without difficulty, it is safe to try more intense activity such as jogging, treadmill, bicycling, low-impact aerobics, swimming, etc. Save the most intensive and strenuous activity for last (Usually 4-8 weeks after surgery) such as sit-ups, heavy lifting, contact sports, etc.  Refrain from any intense heavy lifting or straining until you are off narcotics for pain control.  You will have off days, but things should improve   week-by-week. DO NOT PUSH THROUGH PAIN.  Let pain be your guide: If it hurts to do something, don't do it.  Pain is your body warning you to avoid that activity for another week until the pain goes down. You may drive when you are no longer taking narcotic prescription pain medication, you can comfortably wear a seatbelt, and you can safely make sudden turns/stops to protect yourself without hesitating due to pain. You may have sexual intercourse when it  is comfortable. If it hurts to do something, stop.  MEDICATIONS Take your usually prescribed home medications unless otherwise directed.   Blood thinners:  Usually you can restart any strong blood thinners after the second postoperative day.  It is OK to take aspirin right away.     If you are on strong blood thinners (warfarin/Coumadin, Plavix, Xerelto, Eliquis, Pradaxa, etc), discuss with your surgeon, medicine PCP, and/or cardiologist for instructions on when to restart the blood thinner & if blood monitoring is needed (PT/INR blood check, etc).     PAIN CONTROL Pain after surgery or related to activity is often due to strain/injury to muscle, tendon, nerves and/or incisions.  This pain is usually short-term and will improve in a few months.  To help speed the process of healing and to get back to regular activity more quickly, DO THE FOLLOWING THINGS TOGETHER: Increase activity gradually.  DO NOT PUSH THROUGH PAIN Use Ice and/or Heat Try Gentle Massage and/or Stretching Take over the counter pain medication Take Narcotic prescription pain medication for more severe pain  Good pain control = faster recovery.  It is better to take more medicine to be more active than to stay in bed all day to avoid medications.  Increase activity gradually Avoid heavy lifting at first, then increase to lifting as tolerated over the next 6 weeks. Do not "push through" the pain.  Listen to your body and avoid positions and maneuvers than reproduce the pain.  Wait a few days before trying something more intense Walking an hour a day is encouraged to help your body recover faster and more safely.  Start slowly and stop when getting sore.  If you can walk 30 minutes without stopping or pain, you can try more intense activity (running, jogging, aerobics, cycling, swimming, treadmill, sex, sports, weightlifting, etc.) Remember: If it hurts to do it, then don't do it! Use Ice and/or Heat You will have swelling and  bruising around the incisions.  This will take several weeks to resolve. Ice packs or heating pads (6-8 times a day, 30-60 minutes at a time) will help sooth soreness & bruising. Some people prefer to use ice alone, heat alone, or alternate between ice & heat.  Experiment and see what works best for you.  Consider trying ice for the first few days to help decrease swelling and bruising; then, switch to heat to help relax sore spots and speed recovery. Shower every day.  Short baths are fine.  It feels good!  Keep the incisions and wounds clean with soap & water.   Try Gentle Massage and/or Stretching Massage at the area of pain many times a day Stop if you feel pain - do not overdo it Take over the counter pain medication This helps the muscle and nerve tissues become less irritable and calm down faster Choose ONE of the following over-the-counter anti-inflammatory medications: Acetaminophen 500mg tabs (Tylenol) 1-2 pills with every meal and just before bedtime (avoid if you have liver problems or if you have   acetaminophen in you narcotic prescription) Naproxen 220mg tabs (ex. Aleve, Naprosyn) 1-2 pills twice a day (avoid if you have kidney, stomach, IBD, or bleeding problems) Ibuprofen 200mg tabs (ex. Advil, Motrin) 3-4 pills with every meal and just before bedtime (avoid if you have kidney, stomach, IBD, or bleeding problems) Take with food/snack several times a day as directed for at least 2 weeks to help keep pain / soreness down & more manageable. Take Narcotic prescription pain medication for more severe pain A prescription for strong pain control is often given to you upon discharge (for example: oxycodone/Percocet, hydrocodone/Norco/Vicodin, or tramadol/Ultram) Take your pain medication as prescribed. Be mindful that most narcotic prescriptions contain Tylenol (acetaminophen) as well - avoid taking too much Tylenol. If you are having problems/concerns with the prescription medicine (does  not control pain, nausea, vomiting, rash, itching, etc.), please call us [phone] to see if we need to switch you to a different pain medicine that will work better for you and/or control your side effects better. If you need a refill on your pain medication, you must call the office before 4 pm and on weekdays only.  By federal law, prescriptions for narcotics cannot be called into a pharmacy.  They must be filled out on paper & picked up from our office by the patient or authorized caretaker.  Prescriptions cannot be filled after 4 pm nor on weekends.    WHEN TO CALL US [phone] Severe uncontrolled or worsening pain  Fever over 101 F (38.5 C) Concerns with the incision: Worsening pain, redness, rash/hives, swelling, bleeding, or drainage Reactions / problems with new medications (itching, rash, hives, nausea, etc.) Nausea and/or vomiting Difficulty urinating Difficulty breathing Worsening fatigue, dizziness, lightheadedness, blurred vision Other concerns If you are not getting better after two weeks or are noticing you are getting worse, contact our office [phone] for further advice.  We may need to adjust your medications, re-evaluate you in the office, send you to the emergency room, or see what other things we can do to help. The clinic staff is available to answer your questions during regular business hours (8:30am-5pm).  Please don't hesitate to call and ask to speak to one of our nurses for clinical concerns.    A surgeon from [facility] is always on call at the hospitals 24 hours/day If you have a medical emergency, go to the nearest emergency room or call 911.  FOLLOW UP in our office One the day of your discharge from the hospital (or the next business weekday), please call [facility] to set up or confirm an appointment to see your surgeon in the office for a follow-up appointment.  Usually it is 2-3 weeks after your surgery.   If you  have skin staples at your incision(s), let the office know so we can set up a time in the office for the nurse to remove them (usually around 10 days after surgery). Make sure that you call for appointments the day of discharge (or the next business weekday) from the hospital to ensure a convenient appointment time. IF YOU HAVE DISABILITY OR FAMILY LEAVE FORMS, BRING THEM TO THE OFFICE FOR PROCESSING.  DO NOT GIVE THEM TO YOUR DOCTOR.  [facility] [address], [address], [place],   [address] ? [phone] - Main [phone] - Toll Free,  [phone] - Fax [email or web address]    GETTING TO GOOD BOWEL HEALTH. It is expected for your digestive tract to   need a few months to get back to normal.  It is common for your bowel movements and stools to be irregular.  You will have occasional bloating and cramping that should eventually fade away.  Until you are eating solid food normally, off all pain medications, and back to regular activities; your bowels will not be normal.   Avoiding constipation The goal: ONE SOFT BOWEL MOVEMENT A DAY!    Drink plenty of fluids.  Choose water first. TAKE A FIBER SUPPLEMENT EVERY DAY THE REST OF YOUR LIFE During your first week back home, gradually add back a fiber supplement every day Experiment which form you can tolerate.   There are many forms such as powders, tablets, wafers, gummies, etc Psyllium bran (Metamucil), methylcellulose (Citrucel), Miralax or Glycolax, Benefiber, Flax Seed.  Adjust the dose week-by-week (1/2 dose/day to 6 doses a day) until you are moving your bowels 1-2 times a day.  Cut back the dose or try a different fiber product if it is giving you problems such as diarrhea or bloating. Sometimes a laxative is needed to help jump-start bowels if constipated until the fiber supplement can help regulate your bowels.  If you are tolerating eating & you are farting, it is okay to try a gentle  laxative such as double dose MiraLax, prune juice, or Milk of Magnesia.  Avoid using laxatives too often. Stool softeners can sometimes help counteract the constipating effects of narcotic pain medicines.  It can also cause diarrhea, so avoid using for too long. If you are still constipated despite taking fiber daily, eating solids, and a few doses of laxatives, call our office. Controlling diarrhea Try drinking liquids and eating bland foods for a few days to avoid stressing your intestines further. Avoid dairy products (especially milk & ice cream) for a short time.  The intestines often can lose the ability to digest lactose when stressed. Avoid foods that cause gassiness or bloating.  Typical foods include beans and other legumes, cabbage, broccoli, and dairy foods.  Avoid greasy, spicy, fast foods.  Every person has some sensitivity to other foods, so listen to your body and avoid those foods that trigger problems for you. Probiotics (such as active yogurt, Align, etc) may help repopulate the intestines and colon with normal bacteria and calm down a sensitive digestive tract Adding a fiber supplement gradually can help thicken stools by absorbing excess fluid and retrain the intestines to act more normally.  Slowly increase the dose over a few weeks.  Too much fiber too soon can backfire and cause cramping & bloating. It is okay to try and slow down diarrhea with a few doses of antidiarrheal medicines.   Bismuth subsalicylate (ex. Kayopectate, Pepto Bismol) for a few doses can help control diarrhea.  Avoid if pregnant.   Loperamide (Imodium) can slow down diarrhea.  Start with one tablet (2mg) first.  Avoid if you are having fevers or severe pain.  ILEOSTOMY PATIENTS WILL HAVE CHRONIC DIARRHEA since their colon is not in use.    Drink plenty of liquids.  You will need to drink even more glasses of water/liquid a day to avoid getting dehydrated. Record output from your ileostomy.  Expect to empty  the bag every 3-4 hours at first.  Most people with a permanent ileostomy empty their bag 4-6 times at the least.   Use antidiarrheal medicine (especially Imodium) several times a day to avoid getting dehydrated.  Start with a dose at bedtime & breakfast.  Adjust up or   down as needed.  Increase antidiarrheal medications as directed to avoid emptying the bag more than 8 times a day (every 3 hours). Work with your wound ostomy nurse to learn care for your ostomy.  See ostomy care instructions. TROUBLESHOOTING IRREGULAR BOWELS 1) Start with a soft & bland diet. No spicy, greasy, or fried foods.  2) Avoid gluten/wheat or dairy products from diet to see if symptoms improve. 3) Miralax 17gm or flax seed mixed in 8oz. water or juice-daily. May use 2-4 times a day as needed. 4) Gas-X, Phazyme, etc. as needed for gas & bloating.  5) Prilosec (omeprazole) over-the-counter as needed 6)  Consider probiotics (Align, Activa, etc) to help calm the bowels down  Call your doctor if you are getting worse or not getting better.  Sometimes further testing (cultures, endoscopy, X-ray studies, CT scans, bloodwork, etc.) may be needed to help diagnose and treat the cause of the diarrhea. [facility] [address], [address], [place], [place]  [address] [phone] - Main.    [phone]  - Toll Free.   [phone] - Fax [email or web address]   ###############################   #######################################################  Ostomy Support Information  You've heard that people get along just fine with only one of their eyes, or one of their lungs, or one of their kidneys. But you also know that you have only one intestine and only one bladder, and that leaves you feeling awfully empty, both physically and emotionally: You think no other people go around without part of their intestine with the ends of their intestines sticking out through their abdominal walls.    YOU ARE NOT ALONE.  There are nearly three quarters of a million people in the [place] who have an ostomy; people who have had surgery to remove all or part of their colons or bladders.   There is even a national association, the [employer] with over 350 local affiliated support groups that are organized by volunteers who provide peer support and counseling. [employer] has a toll free telephone num-ber, 800-826-0826 and an educational, interactive website, www.ostomy.org   An ostomy is an opening in the belly (abdominal wall) made by surgery. Ostomates are people who have had this procedure. The opening (stoma) allows the kidney or bowel to grdischarge waste. An external pouch covers the stoma to collect waste. Pouches are are a simple bag and are odor free. Different companies have disposable or reusable pouches to fit one's lifestyle. An ostomy can either be temporary or permanent.   THERE ARE THREE MAIN TYPES OF OSTOMIES Colostomy. A colostomy is a surgically created opening in the large intestine (colon). Ileostomy. An ileostomy is a surgically created opening in the small intestine. Urostomy. A urostomy is a surgically created opening to divert urine away from the bladder.  OSTOMY Care  The following guidelines will make care of your colostomy easier. Keep this information close by for quick reference.  Helpful DIET hints Eat a well-balanced diet including vegetables and fresh fruits. Eat on a regular schedule.  Drink at least 6 to 8 glasses of fluids daily. Eat slowly in a relaxed atmosphere. Chew your food thoroughly. Avoid chewing gum, smoking, and drinking from a straw. This will help decrease the amount of air you swallow, which may help reduce gas. Eating yogurt or drinking buttermilk may help reduce gas.  To control gas at night, do not eat after 8 p.m. This will give your bowel time to quiet down before you go   to bed.  If gas is a problem, you can purchase  Beano. Sprinkle Beano on the first bite of food before eating to reduce gas. It has no flavor and should not change the taste of your food. You can buy Beano over the counter at your local drugstore.  Foods like fish, onions, garlic, broccoli, asparagus, and cabbage produce odor. Although your pouch is odor-proof, if you eat these foods you may notice a stronger odor when emptying your pouch. If this is a concern, you may want to limit these foods in your diet.  If you have an ileostomy, you will have chronic diarrhea & need to drink more liquids to avoid getting dehydrated.  Consider antidiarrheal medicine like imodium (loperamide) or Lomotil to help slow down bowel movements / diarrhea into your ileostomy bag.  GETTING TO GOOD BOWEL HEALTH WITH AN ILEOSTOMY    With the colon bypassed & not in use, you will have small bowel diarrhea.   It is important to thicken & slow your bowel movements down.   The goal: 4-6 small BOWEL MOVEMENTS A DAY It is important to drink plenty of liquids to avoid getting dehydrated  CONTROLLING ILEOSTOMY DIARRHEA  TAKE A FIBER SUPPLEMENT (FiberCon or Benefiner soluble fiber) twice a day - to thicken stools by absorbing excess fluid and retrain the intestines to act more normally.  Slowly increase the dose over a few weeks.  Too much fiber too soon can backfire and cause cramping & bloating.  TAKE AN IRON SUPPLEMENT twice a day to naturally constipate your bowels.  Usually ferrous sulfate 325mg twice a day)  TAKE ANTI-DIARRHEAL MEDICINES: Loperamide (Imodium) can slow down diarrhea.  Start with two tablets (= 4mg) first and then try one tablet every 6 hours.  Can go up to 2 pills four times day (8 pills of 2mg max) Avoid if you are having fevers or severe pain.  If you are not better or start feeling worse, stop all medicines and call your doctor for advice LoMotil (Diphenoxylate / Atropine) is another medicine that can constipate & slow down bowel moevements Pepto  Bismol (bismuth) can gently thicken bowels as well  If diarrhea is worse,: drink plenty of liquids and try simpler foods for a few days to avoid stressing your intestines further. Avoid dairy products (especially milk & ice cream) for a short time.  The intestines often can lose the ability to digest lactose when stressed. Avoid foods that cause gassiness or bloating.  Typical foods include beans and other legumes, cabbage, broccoli, and dairy foods.  Every person has some sensitivity to other foods, so listen to our body and avoid those foods that trigger problems for you.Call your doctor if you are getting worse or not better.  Sometimes further testing (cultures, endoscopy, X-ray studies, bloodwork, etc) may be needed to help diagnose and treat the cause of the diarrhea. Take extra anti-diarrheal medicines (maximum is 8 pills of 2mg loperamide a day)   Tips for POUCHING an OSTOMY   Changing Your Pouch The best time to change your pouch is in the morning, before eating or drinking anything. Your stoma can function at any time, but it will function more after eating or drinking.   Applying the pouching system  Place all your equipment close at hand before removing your pouch.  Wash your hands.  Stand or sit in front of a mirror. Use the position that works best for you. Remember that you must keep the skin around the stoma   wrinkle-free for a good seal.  Gently remove the used pouch (1-piece system) or the pouch and old wafer (2-piece system). Empty the pouch into the toilet. Save the closure clip to use again.  Wash the stoma itself and the skin around the stoma. Your stoma may bleed a little when being washed. This is normal. Rinse and pat dry. You may use a wash cloth or soft paper towels (like Bounty), mild soap (like Dial, Safeguard, or Ivory), and water. Avoid soaps that contain perfumes or lotions.  For a new pouch (1-piece system) or a new wafer (2-piece system), measure your  stoma using the stoma guide in each box of supplies.  Trace the shape of your stoma onto the back of the new pouch or the back of the new wafer. Cut out the opening. Remove the paper backing and set it aside.  Optional: Apply a skin barrier powder to surrounding skin if it is irritated (bare or weeping), and dust off the excess. Optional: Apply a skin-prep wipe (such as Skin Prep or All-Kare) to the skin around the stoma, and let it dry. Do not apply this solution if the skin is irritated (red, tender, or broken) or if you have shaved around the stoma. Optional: Apply a skin barrier paste (such as Stomahesive, Coloplast, or Premium) around the opening cut in the back of the pouch or wafer. Allow it to dry for 30 to 60 seconds.  Hold the pouch (1-piece system) or wafer (2-piece system) with the sticky side toward your body. Make sure the skin around the stoma is wrinkle-free. Center the opening on the stoma, then press firmly to your abdomen (Fig. 4). Look in the mirror to check if you are placing the pouch, or wafer, in the right position. For a 2-piece system, snap the pouch onto the wafer. Make sure it snaps into place securely.  Place your hand over the stoma and the pouch or wafer for about 30 seconds. The heat from your hand can help the pouch or wafer stick to your skin.  Add deodorant (such as Super Banish or Nullo) to your pouch. Other options include food extracts such as vanilla oil and peppermint extract. Add about 10 drops of the deodorant to the pouch. Then apply the closure clamp. Note: Do not use toxic  chemicals or commercial cleaning agents in your pouch. These substances may harm the stoma.  Optional: For extra seal, apply tape to all 4 sides around the pouch or wafer, as if you were framing a picture. You may use any brand of medical adhesive tape. Change your pouch every 5 to 7 days. Change it immediately if a leak occurs.  Wash your hands afterwards.  If you are wearing a  2-piece system, you may use 2 new pouches per week and alternate them. Rinse the pouch with mild soap and warm water and hang it to dry for the next day. Apply the fresh pouch. Alternate the 2 pouches like this for a week. After a week, change the wafer and begin with 2 new pouches. Place the old pouches in a plastic bag, and put them in the trash.   LIVING WITH AN OSTOMY  Emptying Your Pouch Empty your pouch when it is one-third full (of urine, stool, and/or gas). If you wait until your pouch is fuller than this, it will be more difficult to empty and more noticeable. When you empty your pouch, either put toilet paper in the toilet bowl first, or flush the   toilet while you empty the pouch. This will reduce splashing. You can empty the pouch between your legs or to one side while sitting, or while standing or stooping. If you have a 2-piece system, you can snap off the pouch to empty it. Remember that your stoma may function during this time. If you wish to rinse your pouch after you empty it, a turkey baster can be helpful. When using a baster, squirt water up into the pouch through the opening at the bottom. With a 2-piece system, you can snap off the pouch to rinse it. After rinsing  your pouch, empty it into the toilet. When rinsing your pouch at home, put a few granules of Dreft soap in the rinse water. This helps lubricate and freshen your pouch. The inside of your pouch can be sprayed with non-stick cooking oil (Pam spray). This may help reduce stool sticking to the inside of the pouch.  Bathing You may shower or bathe with your pouch on or off. Remember that your stoma may function during this time.  The materials you use to wash your stoma and the skin around it should be clean, but they do not need to be sterile.  Wearing Your Pouch During hot weather, or if you perspire a lot in general, wear a cover over your pouch. This may prevent a rash on your skin under the pouch. Pouch covers are  sold at ostomy supply stores. Wear the pouch inside your underwear for better support. Watch your weight. Any gain or loss of 10 to 15 pounds or more can change the way your pouch fits.  Going Away From Home A collapsible cup (like those that come in travel kits) or a soft plastic squirt bottle with a pull-up top (like a travel bottle for shampoo) can be used for rinsing your pouch when you are away from home. Tilt the opening of the pouch at an upward angle when using a cup to rinse.  Carry wet wipes or extra tissues to use in public bathrooms.  Carry an extra pouching system with you at all times.  Never keep ostomy supplies in the glove compartment of your car. Extreme heat or cold can damage the skin barriers and adhesive wafers on the pouch.  When you travel, carry your ostomy supplies with you at all times. Keep them within easy reach. Do not pack ostomy supplies in baggage that will be checked or otherwise separated from you, because your baggage might be lost. If you're traveling out of the country, it is helpful to have a letter stating that you are carrying ostomy supplies as a medical necessity.  If you need ostomy supplies while traveling, look in the yellow pages of the telephone book under "Surgical Supplies." Or call the local ostomy organization to find out where supplies are available.  Do not let your ostomy supplies get low. Always order new pouches before you use the last one.  Reducing Odor Limit foods such as broccoli, cabbage, onions, fish, and garlic in your diet to help reduce odor. Each time you empty your pouch, carefully clean the opening of the pouch, both inside and outside, with toilet paper. Rinse your pouch 1 or 2 times daily after you empty it (see directions for emptying your pouch and going away from home). Add deodorant (such as Super Banish or Nullo) to your pouch. Use air deodorizers in your bathroom. Do not add aspirin to your pouch. Even though  aspirin can help prevent odor, it   could cause ulcers on your stoma.  When to call the doctor Call the doctor if you have any of the following symptoms: Purple, black, or white stoma Severe cramps lasting more than 6 hours Severe watery discharge from the stoma lasting more than 6 hours No output from the colostomy for 3 days Excessive bleeding from your stoma Swelling of your stoma to more than 1/2-inch larger than usual Pulling inward of your stoma below skin level Severe skin irritation or deep ulcers Bulging or other changes in your abdomen  When to call your ostomy nurse Call your ostomy/enterostomal therapy (WOCN) nurse if any of the following occurs: Frequent leaking of your pouching system Change in size or appearance of your stoma, causing discomfort or problems with your pouch Skin rash or rawness Weight gain or loss that causes problems with your pouch     FREQUENTLY ASKED QUESTIONS   Why haven't you met any of these folks who have an ostomy?  Well, maybe you have! You just did not recognize them because an ostomy doesn't show. It can be kept secret if you wish. Why, maybe some of your best friends, office associates or neighbors have an ostomy ... you never can tell. People facing ostomy surgery have many quality-of-life questions like: Will you bulge? Smell? Make noises? Will you feel waste leaving your body? Will you be a captive of the toilet? Will you starve? Be a social outcast? Get/stay married? Have babies? Easily bathe, go swimming, bend over?  OK, let's look at what you can expect:   Will you bulge?  Remember, without part of the intestine or bladder, and its contents, you should have a flatter tummy than before. You can expect to wear, with little exception, what you wore before surgery ... and this in-cludes tight clothing and bathing suits.   Will you smell?  Today, thanks to modern odor proof pouching systems, you can walk into an ostomy support group  meeting and not smell anything that is foul or offensive. And, for those with an ileostomy or colostomy who are concerned about odor when emptying their pouch, there are in-pouch deodorants that can be used to eliminate any waste odors that may exist.   Will you make noises?  Everyone produces gas, especially if they are an air-swallower. But intestinal sounds that occur from time to time are no differ-ent than a gurgling tummy, and quite often your clothing will muffle any sounds.   Will you feel the waste discharges?  For those with a colostomy or ileostomy there might be a slight pressure when waste leaves your body, but understand that the intestines have no nerve endings, so there will be no unpleasant sensations. Those with a urostomy will probably be unaware of any kidney drainage.   Will you be a captive of the toilet?  Immediately post-op you will spend more time in the bathroom than you will after your body recovers from surgery. Every person is different, but on average those with an ileostomy or urostomy may empty their pouches 4 to 6 times a day; a little  less if you have a colostomy. The average wear time between pouch system changes is 3 to 5 days and the changing process should take less than 30 minutes.   Will I need to be on a special diet? Most people return to their normal diet when they have recovered from surgery. Be sure to chew your food well, eat a well-balanced diet and drink plenty of fluids. If   you experience problems with a certain food, wait a couple of weeks and try it again.  Will there be odor and noises? Pouching systems are designed to be odor-proof or odor-resistant. There are deodorants that can be used in the pouch. Medications are also available to help reduce odor. Limit gas-producing foods and carbonated beverages. You will experience less gas and fewer noises as you heal from surgery.  How much time will it take to care for my ostomy? At first, you may  spend a lot of time learning about your ostomy and how to take care of it. As you become more comfortable and skilled at changing the pouching system, it will take very little time to care for it.   Will I be able to return to work? People with ostomies can perform most jobs. As soon as you have healed from surgery, you should be able to return to work. Heavy lifting (more than 10 pounds) may be discouraged.   What about intimacy? Sexual relationships and intimacy are important and fulfilling aspects of your life. They should continue after ostomy surgery. Intimacy-related concerns should be discussed openly between you and your partner.   Can I wear regular clothing? You do not need to wear special clothing. Ostomy pouches are fairly flat and barely noticeable. Elastic undergarments will not hurt the stoma or prevent the ostomy from functioning.   Can I participate in sports? An ostomy should not limit your involvement in sports. Many people with ostomies are runners, skiers, swimmers or participate in other active lifestyles. Talk with your caregiver first before doing heavy physical activity.  Will you starve?  Not if you follow doctor's orders at each stage of your post-op adjustment. There is no such thing as an "ostomy diet". Some people with an ostomy will be able to eat and tolerate anything; others may find diffi-culty with some foods. Each person is an individual and must determine, by trial, what is best for them. A good practice for all is to drink plenty of water.   Will you be a social outcast?  Have you met anyone who has an ostomy and is a social outcast? Why should you be the first? Only your attitude and self image will effect how you are treated. No confi-dent person is an outcast.    PROFESSIONAL HELP   Resources are available if you need help or have questions about your ostomy.   Specially trained nurses called Wound, Ostomy Continence Nurses (WOCN) are available for  consultation in most major medical centers.  Consider getting an ostomy consult at an outpatient ostomy clinic.   [facility] has an Ostomy Clinic run by an WOCN ostomy nurse at the [facility].  [phone]. [facility] can help set up an appointment   The United Ostomy Association (UOA) is a group made up of many local chapters throughout the [place]. These local groups hold meetings and provide support to prospective and existing ostomates. They sponsor educational events and have qualified visitors to make personal or telephone visits. Contact the UOA for the chapter nearest you and for other educational publications.  More detailed information can be found in Colostomy Guide, a publication of the United Ostomy Association (UOA). Contact UOA at 1-800-826-0826 or visit their web site at www.uoaa.org. The website contains links to other sites, suppliers and resources.  [facility] Secure Start Services: Start at the website to enlist for support.  Your Wound Ostomy (WOCN) nurse may have started this   process. https://www.hollister.com/en/securestart Secure Start services are designed to support people as they live their lives with an ostomy or neurogenic bladder. Enrolling is easy and at no cost to the patient. We realize that each person's needs and life journey are different. Through Secure Start services, we want to help people live their life, their way.  #######################################################  

## 2022-05-19 NOTE — Transfer of Care (Signed)
Immediate Anesthesia Transfer of Care Note  Patient: Terri Mckinney  Procedure(s) Performed: LAPAROSCOPIC DIVERTED OSTOMY (Abdomen) INSERTION PORT-A-CATH (Right: Chest) FLEXIBLE SIGMOIDOSCOPY WITH TATTOO INJECTION (Rectum)  Patient Location: PACU  Anesthesia Type:General  Level of Consciousness: awake, drowsy and patient cooperative  Airway & Oxygen Therapy: Patient Spontanous Breathing and Patient connected to face mask oxygen  Post-op Assessment: Report given to RN and Post -op Vital signs reviewed and stable  Post vital signs: Reviewed and stable  Last Vitals:  Vitals Value Taken Time  BP 123/78 05/19/22 1615  Temp    Pulse 78 05/19/22 1616  Resp 20 05/19/22 1616  SpO2 100 % 05/19/22 1616  Vitals shown include unvalidated device data.  Last Pain:  Vitals:   05/19/22 1213  TempSrc:   PainSc: 10-Worst pain ever      Patients Stated Pain Goal: 9 (10/20/34 6701)  Complications: No notable events documented.

## 2022-05-19 NOTE — Op Note (Signed)
05/19/2022  2:53 PM  PATIENT:  Terri Mckinney  41 y.o. female  Patient Care Team: Sharion Balloon, FNP as PCP - General (Family Medicine) Derek Jack, MD as Medical Oncologist (Medical Oncology) Brien Mates, RN as Oncology Nurse Navigator (Medical Oncology)  PRE-OPERATIVE DIAGNOSIS:  obstructing rectal cancer  POST-OPERATIVE DIAGNOSIS:  obstructing rectal cancer  PROCEDURE:  LAPAROSCOPIC DIVERTED LOOP OSTOMY INSERTION PORT-A-CATH FLEXIBLE SIGMOIDOSCOPY WITH TATTOO INJECTION   Surgeon(s): Leighton Ruff, MD Clovis Riley, MD  ASSISTANT: Dr Kae Heller   ANESTHESIA:   local and general  EBL:54m  Total I/O In: 100 [IV Piggyback:100] Out: -   Delay start of Pharmacological VTE agent (>24hrs) due to surgical blood loss or risk of bleeding:  no  DRAINS: none   SPECIMEN:  Source of Specimen: none  DISPOSITION OF SPECIMEN:  PATHOLOGY  COUNTS:  YES  PLAN OF CARE: Admit to inpatient   PATIENT DISPOSITION:  PACU - hemodynamically stable.  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  I recommended a loop diverting colostomy and port placement to begin chemotherapy.    Use of a central venous catheter for intravenous therapy was discussed. Technique of catheter placement using ultrasound and fluoroscopy guidance was discussed. Risks such as bleeding, infection, pneumothorax, catheter occlusion, reoperation, and other risks were discussed. I noted a good likelihood this will help address the problem. Questions were answered. The patient expressed understanding & wishes to proceed.   The anatomy & physiology of the digestive tract was discussed.  The pathophysiology was discussed.  Natural history risks without surgery was discussed.   I worked to give an overview of the disease and the frequent need to have multispecialty involvement.  I feel the risks of no intervention will lead to serious problems that outweigh the operative risks;  therefore, I recommended a partial colectomy to remove the pathology.  Laparoscopic & open techniques were discussed.   Risks such as bleeding, infection, abscess, leak, reoperation, possible ostomy, hernia, heart attack, death, and other risks were discussed.  I noted a good likelihood this will help address the problem.   Goals of post-operative recovery were discussed as well.    The patient expressed understanding & wished to proceed with surgery.  OR FINDINGS:   Patient had mid rectal mass without obvious T4 involvement   Normal-appearing anatomy.   8 [place] port. It goes through the right internal jugular vein   DESCRIPTION:   Informed consent was confirmed.  The patient underwent general anaesthesia without difficulty.  The patient was positioned appropriately.  VTE prevention in place.  The patient's abdomen was clipped, prepped, & draped in a sterile fashion.  Surgical timeout confirmed our plan.  The patient was positioned in reverse Trendelenburg.  Abdominal entry was gained using a Varies needle in the LUQ.  Entry was clean.  I induced carbon dioxide insufflation.  A 578mlap  port was placed in the RUQ.  Camera inspection revealed no injury.  Extra ports were carefully placed under direct laparoscopic visualization, including a 12 mm port in the previously marked ostomy site.  I laparoscopically reflected the greater omentum and the upper abdomen the small bowel in the upper abdomen.  The patient had a redundant sigmoid.  It easily mobilized to the abdominal wall without any further dissection.  I brought out a distal portion of the sigmoid through the 12 mm port site after enlarging it with Bovie electrocautery.  I evaluated this laparoscopically and confirmed no  twisting of the mesentery and good placement.  The abdomen was then desufflated.  The 5 mm port sites were closed using interrupted 4-0 Vicryl sutures and Dermabond.  The loop colostomy was matured in standard Brooke  fashion using interrupted 2-0 Vicryl sutures.  At this point I performed a flexible sigmoidoscopy.  The mass could be palpated approximately 6 to 7 cm from the anal verge.  Submucosal tattoo injection was performed distally at 3 different points.  The mass was not traversed endoscopically.  The colon was then desufflated.  We switched to clean gloves, gowns, instruments and drapes.   Procedure #2: Informed consent was confirmed.  Neck and chest were clipped and prepped and draped in a sterile fashion. A surgical timeout confirmed our plan. I placed a field block of local anesthesia on the chest.  I entered into the right internal jugular vein on the first venipuncture using US guidance. Non-pulsatile blood was returned. Wire was easily passed into the inferior vena cava and confirmed by fluoroscopy. I confirmed placement of the wire in the right side of the chest.  I made an incision in the lateral infraclavicular area and made a subcutaneous pocket. I used a dilator on the wire using Seldinger technique to dilate the tract under fluoroscopy. I placed the catheter into the sheath. I then peeled away the dilator sheath. I tunneled the power port from the puncture site to the chest pocket. I cut the catheter to appropriate length and attached it to the port using the plastic connector. The port was placed into the pocket and secured to the left anterior chest wall using 2-0 Prolene interrupted stitches x2. Catheter flushed well.  Fluoroscopy confirmed the tip in the distal SVC. Catheter aspirated and flushed well. On final fluoro reevaluation the tip seen to be in good position in the distal SVC.  I closed the wounds using 3-0 Vicryl interrupted sutures for the pocket and 4-0 Monocryl stitch was used to close the skin. Dermabond was used on the 2 incisions. CXR will be performed in PACU. Patient should go home later today. Catheter is okay to use.    An MD assistant was necessary for the abdominal portion of  the case for tissue manipulation, retraction and positioning due to the complexity of the case and hospital policies   Rosario Adie, MD  Colorectal and [facility]

## 2022-05-19 NOTE — Interval H&P Note (Signed)
History and Physical Interval Note:  05/19/2022 1:09 PM  Terri Mckinney  has presented today for surgery, with the diagnosis of obstructing rectal cancer.  The various methods of treatment have been discussed with the patient and family. After consideration of risks, benefits and other options for treatment, the patient has consented to  Procedure(s): LAPAROSCOPIC DIVERTED OSTOMY (N/A) INSERTION PORT-A-CATH (N/A) FLEXIBLE SIGMOIDOSCOPY WITH TATTOO INJECTION (N/A) as a surgical intervention.  The patient's history has been reviewed, patient examined, no change in status, stable for surgery.  I have reviewed the patient's chart and labs.  Questions were answered to the patient's satisfaction.   The surgery and anatomy were described to the patient as well as the risks of surgery and the possible complications.  These include: Bleeding, deep abdominal infections and possible wound complications such as hernia and infection, damage to adjacent structures, leak of surgical connections, which can lead to other surgeries and possibly an ostomy, possible need for other procedures, such as abscess drains in radiology, possible prolonged hospital stay, possible diarrhea from removal of part of the colon, possible constipation from narcotics, prolonged fatigue/weakness or appetite loss, possible early recurrence of of disease, possible complications of their medical problems such as heart disease or arrhythmias or lung problems, death (less than 1%). I believe the patient understands and wishes to proceed with the surgery.    Rosario Adie, MD  Colorectal and [facility]

## 2022-05-19 NOTE — Anesthesia Postprocedure Evaluation (Signed)
Anesthesia Post Note  Patient: Terri Mckinney  Procedure(s) Performed: LAPAROSCOPIC DIVERTED OSTOMY (Abdomen) INSERTION PORT-A-CATH (Right: Chest) FLEXIBLE SIGMOIDOSCOPY WITH TATTOO INJECTION (Rectum)     Patient location during evaluation: PACU Anesthesia Type: General Level of consciousness: awake and alert Pain management: pain level controlled Vital Signs Assessment: post-procedure vital signs reviewed and stable Respiratory status: spontaneous breathing, nonlabored ventilation, respiratory function stable and patient connected to nasal cannula oxygen Cardiovascular status: blood pressure returned to baseline and stable Postop Assessment: no apparent nausea or vomiting Anesthetic complications: no   No notable events documented.  Last Vitals:  Vitals:   05/19/22 1700 05/19/22 1730  BP: 117/74 113/89  Pulse: 72 78  Resp: 17 15  Temp:  36.5 C  SpO2: 97% 99%    Last Pain:  Vitals:   05/19/22 1700  TempSrc:   PainSc: 2                  [place]et [place]

## 2022-05-20 ENCOUNTER — Other Ambulatory Visit: Payer: Self-pay

## 2022-05-20 LAB — BASIC METABOLIC PANEL
Anion gap: 6 (ref 5–15)
BUN: 8 mg/dL (ref 6–20)
CO2: 23 mmol/L (ref 22–32)
Calcium: 8.6 mg/dL — ABNORMAL LOW (ref 8.9–10.3)
Chloride: 109 mmol/L (ref 98–111)
Creatinine, Ser: 0.64 mg/dL (ref 0.44–1.00)
GFR, Estimated: >60 mL/min (ref >60)
Glucose, Bld: 98 mg/dL (ref 70–99)
Potassium: 3.9 mmol/L (ref 3.5–5.1)
Sodium: 138 mmol/L (ref 135–145)

## 2022-05-20 LAB — CBC
HCT: 31.6 % — ABNORMAL LOW (ref 36.0–46.0)
Hemoglobin: 10.7 g/dL — ABNORMAL LOW (ref 12.0–15.0)
MCH: 32.2 pg (ref 26.0–34.0)
MCHC: 33.9 g/dL (ref 30.0–36.0)
MCV: 95.2 fL (ref 80.0–100.0)
Platelets: 317 10*3/uL (ref 150–400)
RBC: 3.32 MIL/uL — ABNORMAL LOW (ref 3.87–5.11)
RDW: 12.5 % (ref 11.5–15.5)
WBC: 13.2 10*3/uL — ABNORMAL HIGH (ref 4.0–10.5)
nRBC: 0 % (ref 0.0–0.2)

## 2022-05-20 NOTE — Consult Note (Signed)
[facility] Nurse ostomy consult note Consult received for patient with new diverting loop colostomy created inter operatively by Dr. Marcello Moores on 05/19/20.  First visit planned for Monday, 6/19.  [facility] nursing team will follow, will remain available to this patient, the nursing, surgical and medical teams.    Thank you for inviting [place] to participate in this patient's Plan of Care.  Maudie Flakes, MSN, RN, CNS, [place], Serita Grammes, [employer], [employer] phone:  [phone]

## 2022-05-20 NOTE — Progress Notes (Signed)
  Subjective No acute events. States she is ready to go home but wants to see WOCN before she leaves. No n/v. Tolerating liquids without issue. Colostomy productive. Ambulating. Pain controlled.  Objective: Vital signs in last 24 hours: Temp:  [97.7 F (36.5 C)-98.6 F (37 C)] 98.2 F (36.8 C) (06/17 0634) Pulse Rate:  [63-99] 68 (06/17 0634) Resp:  [12-18] 14 (06/17 0634) BP: (109-139)/(66-99) 111/66 (06/17 0634) SpO2:  [97 %-100 %] 100 % (06/17 0634) Weight:  [61.8 kg] 61.8 kg (06/16 1144) Last BM Date : 05/19/22  Intake/Output from previous day: 06/16 0701 - 06/17 0700 In: 2554.1 [P.O.:960; I.V.:1394.1; IV Piggyback:200] Out: 1350 [Urine:850; Stool:500] Intake/Output this shift: Total I/O In: -  Out: 600 [Urine:400; Stool:200]  Gen: NAD, comfortable CV: RRR Pulm: Normal work of breathing Abd: Soft, NT/ND. Colostomy pink/productive. Port site dressed/dry with 4x4 and tegaderm in place Ext: SCDs in place  Lab Results: CBC  Recent Labs    05/17/22 1034 05/20/22 0436  WBC 8.9 13.2*  HGB 12.1 10.7*  HCT 36.3 31.6*  PLT 345 317   BMET Recent Labs    05/17/22 1034 05/20/22 0436  NA 140 138  K 3.6 3.9  CL 111 109  CO2 24 23  GLUCOSE 121* 98  BUN 11 8  CREATININE 0.55 0.64  CALCIUM 9.3 8.6*   PT/INR No results for input(s): "LABPROT", "INR" in the last 72 hours. ABG No results for input(s): "PHART", "HCO3" in the last 72 hours.  Invalid input(s): "PCO2", "PO2"  Studies/Results:  Anti-infectives: Anti-infectives (From admission, onward)    Start     Dose/Rate Route Frequency Ordered Stop   05/20/22 0200  cefoTEtan (CEFOTAN) 2 g in sodium chloride 0.9 % 100 mL IVPB        2 g 200 mL/hr over 30 Minutes Intravenous Every 12 hours 05/19/22 1742 05/20/22 0141   05/19/22 1145  cefoTEtan (CEFOTAN) 2 g in sodium chloride 0.9 % 100 mL IVPB        2 g 200 mL/hr over 30 Minutes Intravenous On call to O.R. 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

## 2022-05-20 NOTE — Plan of Care (Signed)

## 2022-05-20 NOTE — Plan of Care (Signed)
  Problem: Education: Goal: Knowledge of General Education information will improve Description: Including pain rating scale, medication(s)/side effects and non-pharmacologic comfort measures 05/20/2022 0343 by Orbie Pyo, RN Outcome: Progressing 05/20/2022 0035 by Orbie Pyo, RN Outcome: Progressing   Problem: Health Behavior/Discharge Planning: Goal: Ability to manage health-related needs will improve 05/20/2022 0343 by Orbie Pyo, RN Outcome: Progressing 05/20/2022 0035 by Orbie Pyo, RN Outcome: Progressing   Problem: Clinical Measurements: Goal: Ability to maintain clinical measurements within normal limits will improve 05/20/2022 0343 by Orbie Pyo, RN Outcome: Progressing 05/20/2022 0035 by Orbie Pyo, RN Outcome: Progressing Goal: Will remain free from infection 05/20/2022 0343 by Orbie Pyo, RN Outcome: Progressing 05/20/2022 0035 by Orbie Pyo, RN Outcome: Progressing Goal: Diagnostic test results will improve 05/20/2022 0343 by Orbie Pyo, RN Outcome: Progressing 05/20/2022 0035 by Orbie Pyo, RN Outcome: Progressing Goal: Respiratory complications will improve 05/20/2022 0343 by Orbie Pyo, RN Outcome: Progressing 05/20/2022 0035 by Orbie Pyo, RN Outcome: Progressing Goal: Cardiovascular complication will be avoided 05/20/2022 0343 by Orbie Pyo, RN Outcome: Progressing 05/20/2022 0035 by Orbie Pyo, RN Outcome: Progressing   Problem: Activity: Goal: Risk for activity intolerance will decrease 05/20/2022 0343 by Orbie Pyo, RN Outcome: Progressing 05/20/2022 0035 by Orbie Pyo, RN Outcome: Progressing   Problem: Nutrition: Goal: Adequate nutrition will be maintained 05/20/2022 0343 by Orbie Pyo, RN Outcome: Progressing 05/20/2022 0035 by Orbie Pyo, RN Outcome: Progressing   Problem: Coping: Goal: Level of anxiety will decrease 05/20/2022 0343 by Orbie Pyo, RN Outcome:  Progressing 05/20/2022 0035 by Orbie Pyo, RN Outcome: Progressing   Problem: Elimination: Goal: Will not experience complications related to bowel motility 05/20/2022 0343 by Orbie Pyo, RN Outcome: Progressing 05/20/2022 0035 by Orbie Pyo, RN Outcome: Progressing Goal: Will not experience complications related to urinary retention 05/20/2022 0343 by Orbie Pyo, RN Outcome: Progressing 05/20/2022 0035 by Orbie Pyo, RN Outcome: Progressing   Problem: Pain Managment: Goal: General experience of comfort will improve 05/20/2022 0343 by Orbie Pyo, RN Outcome: Progressing 05/20/2022 0035 by Orbie Pyo, RN Outcome: Progressing   Problem: Safety: Goal: Ability to remain free from injury will improve 05/20/2022 0343 by Orbie Pyo, RN Outcome: Progressing 05/20/2022 0035 by Orbie Pyo, RN Outcome: Progressing   Problem: Skin Integrity: Goal: Risk for impaired skin integrity will decrease 05/20/2022 0343 by Orbie Pyo, RN Outcome: Progressing 05/20/2022 0035 by Orbie Pyo, RN Outcome: Progressing

## 2022-05-21 ENCOUNTER — Encounter: Payer: Self-pay | Admitting: Surgery

## 2022-05-21 LAB — CBC
HCT: 31.1 % — ABNORMAL LOW (ref 36.0–46.0)
Hemoglobin: 10.2 g/dL — ABNORMAL LOW (ref 12.0–15.0)
MCH: 32 pg (ref 26.0–34.0)
MCHC: 32.8 g/dL (ref 30.0–36.0)
MCV: 97.5 fL (ref 80.0–100.0)
Platelets: 270 10*3/uL (ref 150–400)
RBC: 3.19 MIL/uL — ABNORMAL LOW (ref 3.87–5.11)
RDW: 12.7 % (ref 11.5–15.5)
WBC: 11.2 10*3/uL — ABNORMAL HIGH (ref 4.0–10.5)
nRBC: 0 % (ref 0.0–0.2)

## 2022-05-21 LAB — BASIC METABOLIC PANEL
Anion gap: 6 (ref 5–15)
BUN: 9 mg/dL (ref 6–20)
CO2: 24 mmol/L (ref 22–32)
Calcium: 8.4 mg/dL — ABNORMAL LOW (ref 8.9–10.3)
Chloride: 110 mmol/L (ref 98–111)
Creatinine, Ser: 0.64 mg/dL (ref 0.44–1.00)
GFR, Estimated: >60 mL/min (ref >60)
Glucose, Bld: 87 mg/dL (ref 70–99)
Potassium: 4 mmol/L (ref 3.5–5.1)
Sodium: 140 mmol/L (ref 135–145)

## 2022-05-21 MED ORDER — TRAMADOL HCL 50 MG PO TABS
50.0000 mg | ORAL_TABLET | Freq: Four times a day (QID) | ORAL | 0 refills | Status: DC | PRN
  Filled 2022-05-21: qty 20, 5d supply, fill #0

## 2022-05-21 NOTE — Progress Notes (Signed)
Patient was given discharge instructions, and all questions were answered.  Patient was stable for discharge and was taken to the main exit by wheelchair. 

## 2022-05-21 NOTE — Consult Note (Addendum)
Terri Mckinney ostomy consult note Stoma type/location: LLQ diverting loop colostomy Stomal assessment/size: 1 and 1/2 inch round, red, edematous, lumen is pointing downward toward 6 o'clock Peristomal assessment: intact Treatment options for stomal/peristomal skin: Skin barrier ring Output: liquid brown stool  Ostomy pouching: 2pc. 2 and 1/4 inch pouching system with skin barrier ring Pouch is Terri Mckinney # [id number], Skin barrier is Terri Mckinney  #244 and skin barrier ring is Terri Mckinney # [id number]. Extended session for ostomy teaching with patient and her daughter. Daughter is an EMT and works as a Quarry manager at [employer]. The daughter will be entering nursing school at [employer] in August. The patient's Uncle had an ostomy last year for a volvulus. Patient has been taught to empty by Nursing staff and is doing this independently. She has also been watching ostomy videos on YouTube.  Explained role of ostomy Mckinney and creation of stoma  Explained stoma characteristics (budded, flush, color, texture, care) Demonstrated pouch change (cutting new skin barrier, measuring stoma, cleaning peristomal skin and stoma, use of barrier ring) and instructed to change pouch every 3 days (twice weekly) and whenever she is experiencing leakage. Daughter traces pattern and patient cuts out skin barrier. Education on emptying when 1/3 to 1/2 full and how to empty Demonstrated use of wick to clean spout  Discussed bathing, diet, gas, medication use Discussed risk of peristomal hernia and patient is instructed to limit lifting more than 15 lbs until given further direction by her surgeon. Discussed treatment of peristomal skin (ostomy powder) 1 bottle is provided. Answered patient/family questions:  to their expressed satisfaction.  Patient and daughter are shown the web-based tool created by the Wound ostomy and Continence Nurses Society entitled the Peristomal Skin Assessment Guide psag.wocn.org We together work through a few scenarios and view  videos on stoma measurement, treatment of peristomal skin issues with stoma powder and normal skin.She and her daughter are both comfortable with the use of this tool  Supplies prepared for discharge:  6 skin barrier rings, 6 skin barriers, 6 pouches, 1 bottle of ostomy powder.   Enrolled patient in [place] program: Yes, today.  I communicate to Dr. Dema Severin via [employer] that this patient is ready for discharge today.   Thank you for inviting [place] to participate in this patient's Plan of Care.  Terri Flakes, MSN, RN, CNS, [place], Serita Grammes, [employer], [employer] phone:  [phone]

## 2022-05-21 NOTE — Progress Notes (Signed)
  Subjective No acute events. States she is ready to go home but wants to see WOCN before she leaves. No n/v. Tolerating liquids without issue. Colostomy productive. Ambulating. Pain controlled.  Objective: Vital signs in last 24 hours: Temp:  [98.2 F (36.8 C)-98.8 F (37.1 C)] 98.2 F (36.8 C) (06/18 0601) Pulse Rate:  [77-95] 77 (06/18 0601) Resp:  [16-18] 18 (06/18 0601) BP: (101-112)/(50-62) 110/62 (06/18 0601) SpO2:  [99 %-100 %] 99 % (06/18 0601) Weight:  [65.4 kg] 65.4 kg (06/18 0500) Last BM Date : 05/21/22  Intake/Output from previous day: 06/17 0701 - 06/18 0700 In: 854.3 [P.O.:300; I.V.:554.3] Out: 1080 [Urine:600; Stool:480] Intake/Output this shift: Total I/O In: -  Out: 50 [Stool:50]  Gen: NAD, comfortable CV: RRR Pulm: Normal work of breathing Abd: Soft, NT/ND. Colostomy pink/productive. Port site dressed/dry with 4x4 and tegaderm in place Ext: SCDs in place  Lab Results: CBC  Recent Labs    05/20/22 0436 05/21/22 0423  WBC 13.2* 11.2*  HGB 10.7* 10.2*  HCT 31.6* 31.1*  PLT 317 270   BMET Recent Labs    05/20/22 0436 05/21/22 0423  NA 138 140  K 3.9 4.0  CL 109 110  CO2 23 24  GLUCOSE 98 87  BUN 8 9  CREATININE 0.64 0.64  CALCIUM 8.6* 8.4*   PT/INR No results for input(s): "LABPROT", "INR" in the last 72 hours. ABG No results for input(s): "PHART", "HCO3" in the last 72 hours.  Invalid input(s): "PCO2", "PO2"  Studies/Results:  Anti-infectives: Anti-infectives (From admission, onward)    Start     Dose/Rate Route Frequency Ordered Stop   05/20/22 0200  cefoTEtan (CEFOTAN) 2 g in sodium chloride 0.9 % 100 mL IVPB        2 g 200 mL/hr over 30 Minutes Intravenous Every 12 hours 05/19/22 1742 05/20/22 0141   05/19/22 1145  cefoTEtan (CEFOTAN) 2 g in sodium chloride 0.9 % 100 mL IVPB        2 g 200 mL/hr over 30 Minutes Intravenous On call to O.R. 05/19/22 1142 05/19/22 1425        Assessment/Plan: Patient Active Problem List    Diagnosis Date Noted   Rectal cancer metastasized to liver ([place]) 05/19/2022   Rectal cancer ([place]) 03/28/2022   Rectal mass 03/15/2022   Encounter for surveillance of injectable contraceptive 02/22/2017   s/p Procedure(s): LAPAROSCOPIC DIVERTED OSTOMY INSERTION PORT-A-CATH FLEXIBLE SIGMOIDOSCOPY WITH TATTOO INJECTION 05/19/2022  -Doing great -Needs WOCN to see/teach prior - may be able to go home today if comfortable and has supplies -Diet as tolerated   LOS: 2 days   Nadeen Landau, MD [facility], [place]

## 2022-05-21 NOTE — Progress Notes (Signed)
Patient called to have prescription changed from [facility] to [facility] in [place]. I paged Dr. Gershon Crane, and he said he could not change it because she had been discharged.

## 2022-05-21 NOTE — TOC CM/SW Note (Signed)
  Transition of Care [facility]) Screening Note   Patient Details  Name: Chloe Flis Date of Birth: 07/12/1981   Transition of Care [facility]) CM/SW Contact:    Roseanne Kaufman, RN Phone Number: 05/21/2022, 10:59 AM    Transition of Care Department [facility]) has reviewed patient and no TOC needs have been identified at this time. We will continue to monitor patient advancement through interdisciplinary progression rounds. If new patient transition needs arise, please place a TOC consult.

## 2022-05-22 ENCOUNTER — Encounter (HOSPITAL_COMMUNITY): Payer: Self-pay | Admitting: Hematology

## 2022-05-22 ENCOUNTER — Encounter (HOSPITAL_COMMUNITY): Payer: Self-pay | Admitting: General Surgery

## 2022-05-22 ENCOUNTER — Other Ambulatory Visit (HOSPITAL_COMMUNITY): Payer: Self-pay

## 2022-05-22 NOTE — Discharge Summary (Addendum)
Patient ID: Terri Mckinney MRN: [id number] DOB/AGE: [birth year] 41 y.o.  Admit date: 05/19/2022 Discharge date: 05/21/2022  Discharge Diagnoses Patient Active Problem List   Diagnosis Date Noted   Rectal cancer metastasized to liver [facility]) 05/19/2022   Rectal cancer ([place]) 03/28/2022   Rectal mass 03/15/2022   Encounter for surveillance of injectable contraceptive 02/22/2017    Consultants None  Procedures OR 05/19/22:  LAPAROSCOPIC DIVERTED LOOP OSTOMY INSERTION PORT-A-CATH FLEXIBLE SIGMOIDOSCOPY WITH TATTOO INJECTION  Hospital Course: She was admitted postoperatively and recovered uneventfully. Loop colostomy viable and became productive of stool on POD#1. Diet advanced. She underwent ostomy care instruction and was provided 3 weeks supplies by our [place] team, enrolled in [place] program. On 05/21/22 she was comfortable with and stable for discharge home.    Allergies as of 05/21/2022       Reactions   Wellbutrin [bupropion]    insomnia        Medication List     STOP taking these medications    hydrocortisone 25 MG suppository Commonly known as: ANUSOL-HC   Multi-Vitamins Tabs       TAKE these medications    ibuprofen 200 MG tablet Commonly known as: ADVIL Take 1,000 mg by mouth every 6 (six) hours as needed for moderate pain.   medroxyPROGESTERone 150 MG/ML injection Commonly known as: DEPO-PROVERA INJECT 1 ML (150 MG TOTAL) INTO THE MUSCLE EVERY 3 (THREE) MONTHS   traMADol 50 MG tablet Commonly known as: Ultram Take 1 tablet by mouth every 6 hours as needed for up to 5 days.          Follow-up Information     Leighton Ruff, MD. Schedule an appointment as soon as possible for a visit in 2 week(s).   Specialties: General Surgery, Colon and Rectal Surgery Contact information: [place] [address] [phone]                 Sharon Mt. Dema Severin, M.D. [facility]

## 2022-05-24 ENCOUNTER — Encounter: Payer: Self-pay | Admitting: Family Medicine

## 2022-05-24 ENCOUNTER — Encounter (HOSPITAL_COMMUNITY): Payer: Self-pay | Admitting: Hematology

## 2022-05-24 ENCOUNTER — Other Ambulatory Visit (HOSPITAL_COMMUNITY): Payer: Self-pay | Admitting: *Deleted

## 2022-05-24 ENCOUNTER — Other Ambulatory Visit (HOSPITAL_COMMUNITY): Payer: Self-pay

## 2022-05-24 ENCOUNTER — Inpatient Hospital Stay (HOSPITAL_COMMUNITY): Payer: BC Managed Care – PPO

## 2022-05-24 ENCOUNTER — Telehealth: Payer: Self-pay

## 2022-05-24 ENCOUNTER — Ambulatory Visit: Payer: BC Managed Care – PPO | Admitting: Family Medicine

## 2022-05-24 ENCOUNTER — Encounter (HOSPITAL_COMMUNITY): Payer: Self-pay

## 2022-05-24 VITALS — BP 119/61 | HR 97 | Temp 97.4°F | Wt 130.2 lb

## 2022-05-24 DIAGNOSIS — C20 Malignant neoplasm of rectum: Secondary | ICD-10-CM

## 2022-05-24 DIAGNOSIS — K769 Liver disease, unspecified: Secondary | ICD-10-CM

## 2022-05-24 DIAGNOSIS — Z95828 Presence of other vascular implants and grafts: Secondary | ICD-10-CM

## 2022-05-24 DIAGNOSIS — L282 Other prurigo: Secondary | ICD-10-CM | POA: Diagnosis not present

## 2022-05-24 HISTORY — DX: Presence of other vascular implants and grafts: Z95.828

## 2022-05-24 MED ORDER — LIDOCAINE-PRILOCAINE 2.5-2.5 % EX CREA: TOPICAL_CREAM | CUTANEOUS | 3 refills | Status: DC

## 2022-05-24 MED ORDER — FAMOTIDINE 20 MG PO TABS: 20.0000 mg | ORAL_TABLET | Freq: Two times a day (BID) | ORAL | 0 refills | Status: DC

## 2022-05-24 MED ORDER — HYDROCODONE-ACETAMINOPHEN 5-325 MG PO TABS: 1.0000 | ORAL_TABLET | Freq: Three times a day (TID) | ORAL | 0 refills | Status: DC | PRN

## 2022-05-24 MED ORDER — PROCHLORPERAZINE MALEATE 10 MG PO TABS: 10.0000 mg | ORAL_TABLET | Freq: Four times a day (QID) | ORAL | 1 refills | Status: DC | PRN

## 2022-05-24 MED ORDER — PREDNISONE 20 MG PO TABS: ORAL_TABLET | ORAL | 0 refills | Status: DC

## 2022-05-24 NOTE — Telephone Encounter (Signed)
Patient said someone had called yesterday about FMLA.  She wants to let you know she is on continuous leave now.  She is getting ready to start chemotherapy and her oncologist told her there was no reason for her to go back to work before that.

## 2022-05-24 NOTE — Progress Notes (Signed)

## 2022-05-24 NOTE — Progress Notes (Signed)
Subjective:  Patient ID: Terri Mckinney, female    DOB: 03-20-1981, 41 y.o.   MRN: [id number]  Patient Care Team: Sharion Balloon, FNP as PCP - General (Family Medicine) Derek Jack, MD as Medical Oncologist (Medical Oncology) Brien Mates, RN as Oncology Nurse Navigator (Medical Oncology)   Chief Complaint:  Rash (After taking tramadol. Patient is in pain- post surgical )   HPI: Terri Mckinney is a 41 y.o. female presenting on 05/24/2022 for Rash (After taking tramadol. Patient is in pain- post surgical )   Pt presents today with complaints of pruritic rash to her upper body. She was recently discharged home from hospital after having surgery - ostomy and port-a-cath placement. She reports she was discharged home on Tramadol for the pain and was given gabapentin during her hospital admission for the pain. She has taken the tramadol since discharge but stopped once she noticed the rash. Denies any chest pain, shortness or breath, throat swelling, stridor, wheezing, cough, nausea, or diaphoresis.   Rash This is a new problem. The current episode started yesterday. The problem is unchanged. The affected locations include the abdomen, back, torso, left arm, right arm, chest and neck. The rash is characterized by itchiness and redness. She was exposed to a new medication. Pertinent negatives include no anorexia, congestion, cough, diarrhea, eye pain, facial edema, fatigue, fever, joint pain, nail changes, rhinorrhea, shortness of breath, sore throat or vomiting. Past treatments include nothing. The treatment provided no relief.    Relevant past medical, surgical, family, and social history reviewed and updated as indicated.  Allergies and medications reviewed and updated. Data reviewed: Chart in Epic.   Past Medical History:  Diagnosis Date   Cancer [facility])    Depression, major, single episode, mild ([place])    Port-A-Cath in place 05/24/2022    Past Surgical History:   Procedure Laterality Date   BIOPSY  03/21/2022   Procedure: BIOPSY;  Surgeon: [name], DO;  Location: [facility];  Service: Endoscopy;;   COLONOSCOPY WITH PROPOFOL N/A 03/21/2022   Procedure: COLONOSCOPY WITH PROPOFOL;  Surgeon: [name], DO;  Location: [facility] ENDO SU[facility];  Service: Endoscopy;  Laterality: N/A;  8:30am   FLEXIBLE SIGMOIDOSCOPY N/A 05/19/2022   Procedure: FLEXIBLE SIGMOIDOSCOPY WITH TATTOO INJECTION;  Surgeon: Leighton Ruff, MD;  Location: [facility] ORS;  Service: General;  Laterality: N/A;   LAPAROSCOPIC DIVERTED COLOSTOMY N/A 05/19/2022   Procedure: LAPAROSCOPIC DIVERTED OSTOMY;  Surgeon: Leighton Ruff, MD;  Location: [facility] ORS;  Service: General;  Laterality: N/A;   PORTACATH PLACEMENT Right 05/19/2022   Procedure: INSERTION PORT-A-CATH;  Surgeon: Leighton Ruff, MD;  Location: [facility] ORS;  Service: General;  Laterality: Right;    Social History   Socioeconomic History   Marital status: Married    Spouse name: Not on file   Number of children: Not on file   Years of education: Not on file   Highest education level: Not on file  Occupational History   Not on file  Tobacco Use   Smoking status: Every Day    Packs/day: 2.00    Years: 27.00    Total pack years: 54.00    Types: Cigarettes   Smokeless tobacco: Never  Vaping Use   Vaping Use: Never used  Substance and Sexual Activity   Alcohol use: No   Drug use: No   Sexual activity: Yes    Birth control/protection: Injection  Other Topics Concern   Not on file  Social History Narrative  Not on file   Social Determinants of Health   Financial Resource Strain: Not on file  Food Insecurity: Not on file  Transportation Needs: Not on file  Physical Activity: Not on file  Stress: Not on file  Social Connections: Not on file  Intimate Partner Violence: Not on file    Outpatient Encounter Medications as of 05/24/2022  Medication Sig   [START ON 05/29/2022] Bevacizumab (AVASTIN IV) Inject into the vein  every 14 (fourteen) days.   famotidine (PEPCID) 20 MG tablet Take 1 tablet (20 mg total) by mouth 2 (two) times daily for 14 days.   [START ON 05/29/2022] fluorouracil CALGB 82423 2,400 mg/m2 in sodium chloride 0.9 % 150 mL Inject 2,400 mg/m2 into the vein over 48 hr.   [START ON 05/29/2022] FLUOROURACIL IV Inject into the vein every 14 (fourteen) days.   ibuprofen (ADVIL) 200 MG tablet Take 1,000 mg by mouth every 6 (six) hours as needed for moderate pain.   [START ON 05/29/2022] IRINOTECAN HCL IV Inject into the vein every 14 (fourteen) days.   [START ON 05/29/2022] LEUCOVORIN CALCIUM IV Inject into the vein every 14 (fourteen) days.   lidocaine-prilocaine (EMLA) cream Apply small amount to port a cath site and cover with plastic wrap 1 hour prior to infusion appointments   medroxyPROGESTERone (DEPO-PROVERA) 150 MG/ML injection INJECT 1 ML (150 MG TOTAL) INTO THE MUSCLE EVERY 3 (THREE) MONTHS   OXALIPLATIN IV Inject into the vein every 14 (fourteen) days.   predniSONE (DELTASONE) 20 MG tablet 2 po at sametime daily for 5 days- start tomorrow   prochlorperazine (COMPAZINE) 10 MG tablet Take 1 tablet (10 mg total) by mouth every 6 (six) hours as needed (Nausea or vomiting).   [DISCONTINUED] traMADol (ULTRAM) 50 MG tablet Take 1 tablet by mouth every 6 hours as needed for up to 5 days.   No facility-administered encounter medications on file as of 05/24/2022.    Allergies  Allergen Reactions   Wellbutrin [Bupropion]     insomnia    Review of Systems  Constitutional:  Negative for activity change, appetite change, chills, diaphoresis, fatigue, fever and unexpected weight change.  HENT: Negative.  Negative for congestion, rhinorrhea, sore throat, trouble swallowing and voice change.   Eyes: Negative.  Negative for pain.  Respiratory:  Negative for apnea, cough, choking, chest tightness, shortness of breath, wheezing and stridor.   Cardiovascular:  Negative for chest pain, palpitations and leg  swelling.  Gastrointestinal:  Positive for abdominal pain (recent surgery). Negative for anorexia, blood in stool, constipation, diarrhea, nausea and vomiting.  Endocrine: Negative.   Genitourinary:  Negative for dysuria, frequency and urgency.  Musculoskeletal:  Negative for arthralgias, joint pain and myalgias.  Skin:  Positive for color change and rash. Negative for nail changes, pallor and wound.  Allergic/Immunologic: Negative.   Neurological:  Negative for dizziness and headaches.  Hematological: Negative.   Psychiatric/Behavioral:  Negative for confusion, hallucinations, sleep disturbance and suicidal ideas.   All other systems reviewed and are negative.       Objective:  BP 119/61   Pulse 97   Temp (!) 97.4 F (36.3 C)   Wt 130 lb 3.2 oz (59.1 kg)   LMP 04/19/2022 (Within Weeks) Comment: urine preg.pending 05/19/22-irregular periods,some spotting with depo shot.  SpO2 99%   BMI 19.80 kg/m    Wt Readings from Last 3 Encounters:  05/24/22 130 lb 3.2 oz (59.1 kg)  05/21/22 144 lb 1.6 oz (65.4 kg)  05/08/22 136 lb 3.2  oz (61.8 kg)    Physical Exam Vitals and nursing note reviewed.  Constitutional:      General: She is not in acute distress.    Appearance: Normal appearance. She is well-developed and well-groomed. She is not ill-appearing, toxic-appearing or diaphoretic.  HENT:     Head: Normocephalic and atraumatic.     Jaw: There is normal jaw occlusion.     Right Ear: Hearing normal.     Left Ear: Hearing normal.     Nose: Nose normal.     Mouth/Throat:     Lips: Pink.     Mouth: Mucous membranes are moist.     Pharynx: Oropharynx is clear. Uvula midline.  Eyes:     General: Lids are normal.     Pupils: Pupils are equal, round, and reactive to light.  Neck:     Thyroid: No thyroid mass, thyromegaly or thyroid tenderness.     Vascular: No carotid bruit or JVD.     Trachea: Trachea and phonation normal.  Cardiovascular:     Rate and Rhythm: Normal rate and  regular rhythm.     Chest Wall: PMI is not displaced.     Heart sounds: Normal heart sounds.  Pulmonary:     Effort: Pulmonary effort is normal.     Breath sounds: Normal breath sounds.  Chest:    Abdominal:     General: There is no abdominal bruit.     Palpations: There is no hepatomegaly or splenomegaly.     Comments: Ostomy to LLQ, draining brown colored stool.  Musculoskeletal:        General: Normal range of motion.     Cervical back: Normal range of motion and neck supple.     Right lower leg: No edema.     Left lower leg: No edema.  Lymphadenopathy:     Cervical: No cervical adenopathy.  Skin:    General: Skin is warm and dry.     Capillary Refill: Capillary refill takes less than 2 seconds.     Coloration: Skin is not cyanotic, jaundiced or pale.     Findings: Erythema and rash present. Rash is papular.     Comments: Scattered raised and erythematous papular rash to torso and upper arms.  Neurological:     General: No focal deficit present.     Mental Status: She is alert and oriented to person, place, and time.     Sensory: Sensation is intact.     Motor: Motor function is intact.     Coordination: Coordination is intact.     Gait: Gait is intact.     Deep Tendon Reflexes: Reflexes are normal and symmetric.  Psychiatric:        Attention and Perception: Attention and perception normal.        Mood and Affect: Mood and affect normal.        Speech: Speech normal.        Behavior: Behavior normal. Behavior is cooperative.        Thought Content: Thought content normal.        Cognition and Memory: Cognition and memory normal.        Judgment: Judgment normal.     Results for orders placed or performed during the hospital encounter of 05/19/22  Pregnancy, urine  Result Value Ref Range   Preg Test, Ur NEGATIVE NEGATIVE  CBC  Result Value Ref Range   WBC 13.2 (H) 4.0 - 10.5 K/uL   RBC 3.32 (L) 3.87 -  5.11 MIL/uL   Hemoglobin 10.7 (L) 12.0 - 15.0 g/dL   HCT  31.6 (L) 36.0 - 46.0 %   MCV 95.2 80.0 - 100.0 fL   MCH 32.2 26.0 - 34.0 pg   MCHC 33.9 30.0 - 36.0 g/dL   RDW 12.5 11.5 - 15.5 %   Platelets 317 150 - 400 K/uL   nRBC 0.0 0.0 - 0.2 %  Basic metabolic panel  Result Value Ref Range   Sodium 138 135 - 145 mmol/L   Potassium 3.9 3.5 - 5.1 mmol/L   Chloride 109 98 - 111 mmol/L   CO2 23 22 - 32 mmol/L   Glucose, Bld 98 70 - 99 mg/dL   BUN 8 6 - 20 mg/dL   Creatinine, Ser 0.64 0.44 - 1.00 mg/dL   Calcium 8.6 (L) 8.9 - 10.3 mg/dL   GFR, Estimated >60 >60 mL/min   Anion gap 6 5 - 15  CBC  Result Value Ref Range   WBC 11.2 (H) 4.0 - 10.5 K/uL   RBC 3.19 (L) 3.87 - 5.11 MIL/uL   Hemoglobin 10.2 (L) 12.0 - 15.0 g/dL   HCT 31.1 (L) 36.0 - 46.0 %   MCV 97.5 80.0 - 100.0 fL   MCH 32.0 26.0 - 34.0 pg   MCHC 32.8 30.0 - 36.0 g/dL   RDW 12.7 11.5 - 15.5 %   Platelets 270 150 - 400 K/uL   nRBC 0.0 0.0 - 0.2 %  Basic metabolic panel  Result Value Ref Range   Sodium 140 135 - 145 mmol/L   Potassium 4.0 3.5 - 5.1 mmol/L   Chloride 110 98 - 111 mmol/L   CO2 24 22 - 32 mmol/L   Glucose, Bld 87 70 - 99 mg/dL   BUN 9 6 - 20 mg/dL   Creatinine, Ser 0.64 0.44 - 1.00 mg/dL   Calcium 8.4 (L) 8.9 - 10.3 mg/dL   GFR, Estimated >60 >60 mL/min   Anion gap 6 5 - 15  ABO/Rh  Result Value Ref Range   ABO/RH(D)      A POS Performed at [facility], [place] [address] [place], [place] [address]        Pertinent labs & imaging results that were available during my care of the patient were reviewed by me and considered in my medical decision making.  Assessment & Plan:  Asa was seen today for rash.  Diagnoses and all orders for this visit:  Pruritic rash No indications of anaphylaxis. Likely an allergic reaction to tramadol. Pt aware to discontinue medication and contact provider to have this changed to new medication for her postop pain. Symptomatic care discussed in detail. Prednisone and pepcid as prescribed. Has zyrtec at  home, aware to take every night for next 2 weeks. Follow up for continued, new, or worsening symptoms.  -     predniSONE (DELTASONE) 20 MG tablet; 2 po at sametime daily for 5 days- start tomorrow -     famotidine (PEPCID) 20 MG tablet; Take 1 tablet (20 mg total) by mouth 2 (two) times daily for 14 days.     Continue all other maintenance medications.  Follow up plan: Return if symptoms worsen or fail to improve.   The above assessment and management plan was discussed with the patient. The patient verbalized understanding of and has agreed to the management plan. Patient is aware to call the clinic if they develop any new symptoms or if symptoms persist or worsen. Patient is  aware when to return to the clinic for a follow-up visit. Patient educated on when it is appropriate to go to the emergency department.   Monia Pouch, FNP-C [facility] [phone]

## 2022-05-24 NOTE — Patient Instructions (Addendum)
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 You will be treated in the clinic every 2 weeks with a combination of chemotherapy drugs and a monoclonal antibody. Those drugs are Avastin (monoclonal antibody), oxaliplatin, irinotecan, fluorouracil, and leucovorin (a vitamin, not chemo). The intent of treatment is to control this cancer, prevent it from spreading further, and to alleviate any symptoms you may be having related to this disease. You will see the doctor regularly throughout treatment.  We will obtain blood work from you prior to every treatment and monitor your results to make sure it is safe to give your treatment. The doctor monitors your response to treatment by the way you are feeling, your blood work, and by obtaining scans periodically.  There will be wait times while you are here for treatment.  It will take about 30 minutes to 1 hour for your lab work to result.  Then there will be wait times while pharmacy mixes your medications.   Medications you will receive in the clinic prior to your chemotherapy medications:  Aloxi:  ALOXI is used in adults to help prevent nausea and vomiting that happens with certain chemotherapy drugs.  Aloxi is a long acting medication, and will remain in your system for about two days.   Emend:  This is an anti-nausea medication that is used with Aloxi to help prevent nausea and vomiting caused by chemotherapy.  Dexamethasone:  This is a steroid given prior to chemotherapy to help prevent allergic reactions; it may also help prevent and control nausea and diarrhea.     Bevacizumab (Avastin)  About This Drug Bevacizumab is used to treat cancer. It is given in the vein (IV). This will take 10 minutes to infuse.   Possible Side Effects  Teary eyes   Runny/stuffy nose   Nosebleed   Changes in the way food and drinks taste   Headache   Back pain   Protein in your  urine   Bleeding in your rectum   Dry skin   A red skin rash which can be peeling or scaling   High blood pressure  Note: Each of the side effects above was reported in 10% or greater of patients treated with bevacizumab-xxxx. Not all possible side effects are included above.  Warnings and Precautions   Perforation or fistula- an abnormal hole in your stomach, intestine, esophagus, or other organ, which can be life-threatening  Slow wound healing, which can be life-threatening  Abnormal bleeding which can be life-threatening - symptoms may be coughing up blood, throwing up blood (may look like coffee grounds), red or black tarry bowel movements, abnormally heavy menstrual flow, nosebleeds or any other unusual bleeding.  Blood clots and events such as stroke and heart attack. A blood clot in your leg may cause your leg to swell, appear red and warm, and/or cause pain. A blood clot in your lungs may cause trouble breathing, pain when breathing, and/or chest pain.  Severe high blood pressure  Changes in your central nervous system can happen. The central nervous system is made up of your brain and spinal cord. You could feel extreme tiredness, agitation, confusion, hallucinations   This drug may interact with other medicines. Tell your doctor and pharmacist about all the medicines and dietary supplements (vitamins, minerals, herbs and others) that you are taking at this time. Also, check with your doctor or pharmacist before starting any new prescription or over-the-counter medicines, or  dietary supplements to make sure that there are no interactions.  When to Call the Doctor Call your doctor or nurse if you have any of these symptoms and/or any new or unusual symptoms:   Fever of 100.4 F (38 C) or higher   Chills   Confusion and/or agitation   Hallucinations   Trouble understanding or speaking   Headache that does not go away   Nose bleed that doesn't stop bleeding after 10 -15  minutes   Feeling dizzy or lightheaded   Blurry vision or changes in your eyesight   Difficulty swallowing   Easy bleeding or bruising   Blood in your urine, vomit (bright red or coffee-ground) and/or stools ( bright red, or black/tarry)   Coughing up blood   Wheezing and/or trouble breathing   Chest pain or symptoms of a heart attack. Most heart attacks involve pain in the center of the chest that lasts more than a few minutes. The pain may go away and come back. It can feel like pressure, squeezing, fullness, or pain. Sometimes pain is felt in one or both arms, the back, neck, jaw, or stomach. If any of these symptoms last 2 minutes, call 911.   Symptoms of a stroke such as sudden numbness or weakness of your face, arm, or leg, mostly on one side of your body; sudden confusion, trouble speaking or understanding; sudden trouble seeing in one or both eyes; sudden trouble walking, feeling dizzy, loss of balance or coordination; or sudden, bad headache with no known cause. If you have any of these symptoms for 2 minutes, call 911.   Numbness or lack of strength to your arms, legs, face, or body   Nausea that stops you from eating or drinking and/or relieved by prescribed medicine   Throwing up   Pain in your abdomen that does not go away   Foamy or bubbly-looking urine   Signs of infusion reaction: fever or shaking chills, flushing, facial swelling, feeling dizzy, headache, trouble breathing, rash, itching, chest tightness, or chest pain.   Pain that does not go away or is not relieved by prescribed medicine   Your leg or arm is swollen, red, warm and/or painful   Swelling of arms, hands, legs and/or feet   Weight gain of 5 pounds in one week (fluid retention)   If you think you may be pregnant  Reproduction Warnings  Pregnancy warning: This drug can have harmful effects on the unborn baby. Women of child bearing potential should use effective methods of birth control during  your cancer treatment and for 6 months after treatment. In women, changes in your ovaries may happen that may cause menstrual bleeding to become irregular or stop, do not assume you cannot get pregnant. Let your doctor know right away if you think you may be pregnant   Breastfeeding warning: Women should not breastfeed during treatment and for 6 months after treatment because this drug could enter the breast milk and cause harm to a breastfeeding baby.    Fertility warning: In women, this drug may affect your ability to have children in the future. Talk with your doctor or nurse if you plan to have children. Ask for information on egg banking.   Oxaliplatin (Eloxatin)  About This Drug  Oxaliplatin is used to treat cancer. It is given in the vein (IV).  It takes two hours to infuse.  Possible Side Effects   Bone marrow suppression. This is a decrease in the number of white blood  cells, red blood cells, and platelets. This may raise your risk of infection, make you tired and weak (fatigue), and raise your risk of bleeding.   Tiredness   Soreness of the mouth and throat. You may have red areas, white patches, or sores that hurt.   Nausea and vomiting (throwing up)   Diarrhea (loose bowel movements)   Changes in your liver function   Effects on the nerves called peripheral neuropathy. You may feel numbness, tingling, or pain in your hands and feet, and may be worse in cold temperatures. It may be hard for you to button your clothes, open jars, or walk as usual. The effect on the nerves may get worse with more doses of the drug. These effects get better in some people after the drug is stopped but it does not get better in all people  Note: Each of the side effects above was reported in 40% or greater of patients treated with oxaliplatin. Not all possible side effects are included above.   Warnings and Precautions   Allergic reactions, including anaphylaxis, which may be  life-threatening are rare but may happen in some patients. Signs of allergic reaction to this drug may be swelling of the face, feeling like your tongue or throat are swelling, trouble breathing, rash, itching, fever, chills, feeling dizzy, and/or feeling that your heart is beating in a fast or not normal way. If this happens, do not take another dose of this drug. You should get urgent medical treatment.   Inflammation (swelling) of the lungs, which may be life-threatening. You may have a dry cough or trouble breathing.   Effects on the nerves (neuropathy) may resolve within 14 days, or it may persist beyond 14 days.   Severe decrease in white blood cells when combined with the chemotherapy agents 5-fluorouracil and leucovorin. This may be life-threatening.   Severe changes in your liver function   Abnormal heart beat and/or EKG, which can be life-threatening   Rhabdomyolysis- damage to your muscles which may release proteins in your blood and affect how your kidneys work, which can be life-threatening. You may have severe muscle weakness and/or pain, or dark urine.  Important Information   This drug may impair your ability to drive or use machinery. Talk to your doctor and/or nurse about precautions you may need to take.   This drug may be present in the saliva, tears, sweat, urine, stool, vomit, semen, and vaginal secretions. Talk to your doctor and/or your nurse about the necessary precautions to take during this time.  * The effects on the nerves can be aggravated by exposure to cold. Avoid cold beverages, use of ice and make sure you cover your skin and dress warmly prior to being exposed to cold temperatures while you are receiving treatment with oxaliplatin*   Treating Side Effects   Manage tiredness by pacing your activities for the day.   Be sure to include periods of rest between energy-draining activities.   To decrease the risk of infection, wash your hands regularly.    Avoid close contact with people who have a cold, the flu, or other infections.  Take your temperature as your doctor or nurse tells you, and whenever you feel like you may have a fever.   To help decrease the risk of bleeding, use a soft toothbrush. Check with your nurse before using dental floss.   Be very careful when using knives or tools.   Use an electric shaver instead of a razor.  Drink plenty of fluids (a minimum of eight glasses per day is recommended).   Mouth care is very important. Your mouth care should consist of routine, gentle cleaning of your teeth or dentures and rinsing your mouth with a mixture of 1/2 teaspoon of salt in 8 ounces of water or 1/2 teaspoon of baking soda in 8 ounces of water. This should be done at least after each meal and at bedtime.   If you have mouth sores, avoid mouthwash that has alcohol. Also avoid alcohol and smoking because they can bother your mouth and throat.   To help with nausea and vomiting, eat small, frequent meals instead of three large meals a day. Choose foods and drinks that are at room temperature. Ask your nurse or doctor about other helpful tips and medicine that is available to help stop or lessen these symptoms.   If you throw up or have loose bowel movements, you should drink more fluids so that you do not become dehydrated (lack of water in the body from losing too much fluid).   If you have diarrhea, eat low-fiber foods that are high in protein and calories and avoid foods that can irritate your digestive tracts or lead to cramping.   Ask your nurse or doctor about medicine that can lessen or stop your diarrhea.   If you have numbness and tingling in your hands and feet, be careful when cooking, walking, and handling sharp objects and hot liquids.   Do not drink cold drinks or use ice in beverages. Drink fluids at room temperature or warmer, and drink through a straw.   Wear gloves to touch cold objects, and wear warm  clothing and cover you skin during cold weather.   Food and Drug Interactions   There are no known interactions of oxaliplatin with food and other medications.   This drug may interact with other medicines. Tell your doctor and pharmacist about all the prescription and over-the-counter medicines and dietary supplements (vitamins, minerals, herbs and others) that you are taking at this time. Also, check with your doctor or pharmacist before starting any new prescription or over-the-counter medicines, or dietary supplements to make sure that there are no interactions   When to Call the Doctor  Call your doctor or nurse if you have any of these symptoms and/or any new or unusual symptoms:   Fever of 100.4 F (38 C) or higher   Chills   Tiredness that interferes with your daily activities   Feeling dizzy or lightheaded   Easy bleeding or bruising   Feeling that your heart is beating in a fast or not normal way (palpitations)   Pain in your chest   Dry cough   Trouble breathing   Pain in your mouth or throat that makes it hard to eat or drink   Nausea that stops you from eating or drinking and/or is not relieved by prescribed medicines   Throwing up   Diarrhea, 4 times in one day or diarrhea with lack of strength or a feeling of being dizzy   Numbness, tingling, or pain in your hands and feet   Signs of possible liver problems: dark urine, pale bowel movements, bad stomach pain, feeling very tired and weak, unusual itching, or yellowing of the eyes or skin   Signs of rhabdomyolysis: decreased urine, very dark urine, muscle pain in the shoulders, thighs, or lower back; muscle weakness or trouble moving arms and legs   Signs of allergic reaction: swelling of  the face, feeling like your tongue or throat are swelling, trouble breathing, rash, itching, fever, chills, feeling dizzy, and/or feeling that your heart is beating in a fast or not normal way. If this happens, call 911  for emergency care.   If you think you may be pregnant  Reproduction Warnings   Pregnancy warning: This drug may have harmful effects on the unborn baby. Women of childbearing potential should use effective methods of birth control during your cancer treatment. Let your doctor know right away if you think you may be pregnant or may have impregnated your partner.   Breastfeeding warning: It is not known if this drug passes into breast milk. For this reason, women should talk to their doctor about the risks and benefits of breastfeeding during treatment with this drug because this drug may enter the breast milk and cause harm to a breastfeeding baby.   Fertility warning: Human fertility studies have not been done with this drug. Talk with your doctor or nurse if you plan to have children. Ask for information on sperm or egg banking.   Irinotecan (Camptosar)    About This Drug  Irinotecan is used to treat cancer. It is given in the vein (IV). It will take 1.5 hours to infuse.    Possible Side Effects   Bone marrow suppression. This is a decrease in the number of white blood cells, red blood cells, and platelets. This may raise your risk of infection, make you tired and weak, and raise your risk of bleeding.    Soreness of the mouth and throat. You may have red areas, white patches, or sores that hurt.    Nausea and vomiting (throwing up)    Pain in your abdomen  Diarrhea (loose bowel movements)    Constipation (unable to move bowels)    Decreased appetite (decreased hunger)    Weight loss    Changes in your liver function    Pain    Weakness    Fever    Infection    Hair loss. Hair loss is often temporary, although with certain medicine, hair loss can sometimes be permanent. Hair loss may happen suddenly or gradually. If you lose hair, you may lose it from your head, face, armpits, pubic area, chest, and/or legs. You may also notice your hair getting thin.   Note: Each of  the side effects above was reported in 30% or greater of patients treated with irinotecan alone or in combination with other chemotherapy. Not all possible side effects are included above.   Warnings and Precautions    Severe diarrhea and colitis which is swelling (inflammation) in the colon - symptoms are diarrhea, stomach cramping, and sometimes blood in the bowel movements. Very rarely, an abnormal hole in your stomach, small and/or large intestine can happen. Diarrhea can begin shortly after the infusion or up to a week or two after, and can be life-threatening if it leads to dehydration, and other complications.     Severe bone marrow suppression which can be life-threatening    Allergic reactions, including anaphylaxis are rare but may happen in some patients. Signs of allergic reaction to this drug may be swelling of the face, feeling like your tongue or throat are swelling, trouble breathing, rash, itching, fever, chills, feeling dizzy, and/or feeling that your heart is beating in a fast or not normal way. If this happens, do not take another dose of this drug. You should get urgent medical treatment.    Changes in  your kidney function which can cause kidney failure and be life-threatening    Inflammation (swelling) or scarring of the lungs, which can be life-threatening. You may have a cough or trouble breathing. Note: Some of the side effects above are very rare. If you have concerns and/or questions, please discuss them with your medical team. Important Information    This drug may be present in the saliva, tears, sweat, urine, stool, vomit, semen, and vaginal secretions. Talk to your doctor and/or your nurse about the necessary precautions to take during this time.    It is important that you notify your doctor and/or nurse at the first sign of diarrhea, so they can provide you with anti-diarrheal medication and give you further instructions. Notify your doctor and/ or nurse if you are  taking anti-diarrheal medication and your symptoms have not improved or are worsening.    Treating Side Effects    Manage tiredness by pacing your activities for the day.    Be sure to include periods of rest between energy-draining activities.    To decrease the risk of infection, wash your hands regularly.  Avoid close contact with people who have a cold, the flu, or other infections.    Take your temperature as your doctor or nurse tells you, and whenever you feel like you may have a fever.    To help decrease the risk of bleeding, use a soft toothbrush. Check with your nurse before using dental floss.    Be very careful when using knives or tools.    Use an electric shaver instead of a razor.    Drink plenty of fluids (a minimum of eight glasses per day is recommended).    If you throw up or have diarrhea, you should drink more fluids so that you do not become dehydrated (lack of water in the body from losing too much fluid).    Mouth care is very important. Your mouth care should consist of routine, gentle cleaning of your teeth or dentures and rinsing your mouth with a mixture of 1/2 teaspoon of salt in 8 ounces of water or 1/2 teaspoon of baking soda in 8 ounces of water. This should be done at least after each meal and at bedtime.    If you have mouth sores, avoid mouthwash that has alcohol. Also avoid alcohol and smoking because they can bother your mouth and throat.     To help with nausea and vomiting, eat small, frequent meals instead of three large meals a day. Choose foods and drinks that are at room temperature. Ask your nurse or doctor about other helpful tips and medicine that is available to help stop or lessen these symptoms.    If you have diarrhea, eat low-fiber foods that are high in protein and calories and avoid foods that can irritate your digestive tracts or lead to cramping.    If you are not able to move your bowels, check with your doctor or nurse before  you use enemas, laxatives, or suppositories.    Ask your doctor or nurse about medicines that are available to help stop or lessen constipation and/ or diarrhea.    To help with decreased appetite, eat small, frequent meals. Eat foods high in calories and protein, such as meat, poultry, fish, dry beans, tofu, eggs, nuts, milk, yogurt, cheese, ice cream, pudding, and nutritional supplements.    Consider using sauces and spices to increase taste. Daily exercise, with your doctor's approval, may increase your appetite.  To help with weight loss, drink fluids that contribute calories (whole milk, juice, soft drinks, sweetened beverages, milkshakes, and nutritional supplements) instead of water.    Keeping your pain under control is important to your well-being. Please tell your doctor or nurse if you are experiencing pain.    To help with hair loss, wash with a mild shampoo and avoid washing your hair every day. Avoid coloring your hair.    Avoid rubbing your scalp, pat your hair or scalp dry.    Limit your use of hair spray, electric curlers, blow dryers, and curling irons.    If you are interested in getting a wig, talk to your nurse and they can help you get in touch with programs in your local area.    Food and Drug Interactions    This drug may interact with grapefruit and grapefruit juice. Talk to your doctor as this could make side effects worse.    Check with your doctor or pharmacist about all other prescription medicines and over-the-counter medicines and dietary supplements (vitamins, minerals, herbs, and others) you are taking before starting this medicine as there are known drug interactions with irinotecan. Also, check with your doctor or pharmacist before starting any new prescription or over-the-counter medicines, or dietary supplements to make sure that there are no interactions.    Avoid the use of St. John's Wort while taking irinotecan as this may lower the levels of the  drug in your body, which can make it less effective.    When to Call the Doctor   Call your doctor or nurse if you have any of these symptoms and/or any new or unusual symptoms:    Fever of 100.4 F (38 C) or higher    Chills    Pain in your chest     Dry cough    Trouble breathing and/or wheezing    Feeling dizzy or lightheaded    Easy bleeding or bruising    Tiredness or weakness that interferes with your daily activities    Pain in your mouth or throat that makes it hard to eat or drink    Nausea that stops you from eating or drinking and/or is not relieved by prescribed medicines    Throwing up   Diarrhea, 4 times in one day or diarrhea with lack of strength or a feeling of being dizzy    No bowel movement in 3 days or when you feel uncomfortable    Severe abdominal pain that does not go away    Difficulty swallowing    General pain that does not go away, or is not relieved by prescribed medicines    Blood in your stool    Lasting loss of appetite or rapid weight loss of five pounds in a week    Decreased or very dark urine    Signs of allergic reaction: swelling of the face, feeling like your tongue or throat are swelling, trouble breathing, rash, itching, fever, chills, feeling dizzy, and/or feeling that your heart is beating in a fast or not normal way. If this happens, call 911 for emergency care.    Signs of possible liver problems: dark urine, pale bowel movements, pain in your abdomen, feeling very tired and weak, unusual itching, or yellowing of the eyes or skin    If you think you may be pregnant or may have impregnated your partner    Reproduction Warnings    Pregnancy warning: This drug can have harmful effects on the  unborn baby. Women of childbearing potential should use effective methods of birth control during your cancer treatment and for 6 months after treatment. Men with female partners of childbearing potential should use effective methods  of birth control during your cancer treatment and for 3 months after your cancer treatment. Let your doctor know right away if you think you may be pregnant or may have impregnated your partner.    Breastfeeding warning: Women should not breastfeed during treatment and for at least 7 days after treatment because this drug can enter the breast milk and cause harm to a breastfeeding baby.  Fertility warning: In men and women both, this drug may affect your ability to have children in the future. Talk with your doctor or nurse if you plan to have children. Ask for information on sperm or egg banking. Revised August 2021   Leucovorin Calcium  About This Drug  Leucovorin is a vitamin. It is used in combination with other cancer fighting drugs such as 5-fluorouracil and methotrexate. Leucovorin is given in the vein (IV).  This drug runs at the same time as the oxaliplatin and takes 2 hours to infuse.   Possible Side Effects  Rash and itching  Note: Leucovorin by itself has very few side effects. Other side effects you may have can be caused by the other drugs you are taking, such as 5-fluorouracil.    Warnings and Precautions   Allergic reactions, including anaphylaxis are rare but may happen in some patients. Signs of allergic reaction to this drug may be swelling of the face, feeling like your tongue or throat are swelling, trouble breathing, rash, itching, fever, chills, feeling dizzy, and/or feeling that your heart is beating in a fast or not normal way. If this happens, do not take another dose of this drug. You should get urgent medical treatment.   Food and Drug Interactions   There are no known interactions of leucovorin with food.   This drug may interact with other medicines. Tell your doctor and pharmacist about all the prescription and over-the-counter medicines and dietary supplements (vitamins, minerals, herbs and others) that you are taking at this time.   Also, check with your  doctor or pharmacist before starting any new prescription or over-the-counter medicines, or dietary supplements to make sure that there are no interactions.   When to Call the Doctor  Call your doctor or nurse if you have any of these symptoms and/or any new or unusual symptoms:   A new rash or a rash that is not relieved by prescribed medicines   Signs of allergic reaction: swelling of the face, feeling like your tongue or throat are swelling, trouble breathing, rash, itching, fever, chills, feeling dizzy, and/or feeling that your heart is beating in a fast or not normal way. If this happens, call 911 for emergency care.   If you think you may be pregnant   Reproduction Warnings   Pregnancy warning: It is not known if this drug may harm an unborn child. For this reason, be sure to talk with your doctor if you are pregnant or planning to become pregnant while receiving this drug. Let your doctor know right away if you think you may be pregnant   Breastfeeding warning: It is not known if this drug passes into breast milk. For this reason, women should talk to their doctor about the risks and benefits of breastfeeding during treatment with this drug because this drug may enter the breast milk and cause harm to  a breastfeeding baby.   Fertility warning: Human fertility studies have not been done with this drug. Talk with your doctor or nurse if you plan to have children. Ask for information on sperm or egg banking.   5-Fluorouracil (Adrucil; 5FU)  About This Drug  Fluorouracil is used to treat cancer. It is given in the vein (IV). It is given as an IV push from a syringe and also as a continuous infusion given via an ambulatory pump (a pump you take home and wear for a specified amount of time).  Possible Side Effects   Bone marrow suppression. This is a decrease in the number of white blood cells, red blood cells, and platelets. This may raise your risk of infection, make you tired and  weak (fatigue), and raise your risk of bleeding   Changes in the tissue of the heart and/or heart attack. Some changes may happen that can cause your heart to have less ability to pump blood.   Blurred vision or other changes in eyesight   Nausea and throwing up (vomiting)   Diarrhea (loose bowel movements)   Ulcers - sores that may cause pain or bleeding in your digestive tract, which includes your mouth, esophagus, stomach, small/large intestines and rectum   Soreness of the mouth and throat. You may have red areas, white patches, or sores that hurt.   Allergic reactions, including anaphylaxis are rare but may happen in some patients. Signs of allergic reaction to this drug may be swelling of the face, feeling like your tongue or throat are swelling, trouble breathing, rash, itching, fever, chills, feeling dizzy, and/or feeling that your heart is beating in a fast or not normal way. If this happens, do not take another dose of this drug. You should get urgent medical treatment.   Sensitivity to light (photosensitivity). Photosensitivity means that you may become more sensitive to the sun and/or light. You may get a skin rash/reaction if you are in the sun or are exposed to sun lamps and tanning beds. Your eyes may water more, mostly in bright light.   Changes in your nail color, nail loss and/or brittle nail   Darkening of the skin, or changes to the color of your skin and/or veins used for infusion   Rash, dry skin, or itching  Note: Not all possible side effects are included above.  Warnings and Precautions   Hand-and-foot syndrome. The palms of your hands or soles of your feet may tingle, become numb, painful, swollen, or red.   Changes in your central nervous system can happen. The central nervous system is made up of your brain and spinal cord. You could feel extreme tiredness, agitation, confusion, hallucinations (see or hear things that are not there), trouble understanding or  speaking, loss of control of your bowels or bladder, eyesight changes, numbness or lack of strength to your arms, legs, face, or body, or coma. If you start to have any of these symptoms let your doctor know right away.   Side effects of this drug may be unexpectedly severe in some patients  Note: Some of the side effects above are very rare. If you have concerns and/or questions, please discuss them with your medical team.   Important Information   This drug may be present in the saliva, tears, sweat, urine, stool, vomit, semen, and vaginal secretions. Talk to your doctor and/or your nurse about the necessary precautions to take during this time.   Treating Side Effects   Manage tiredness by  pacing your activities for the day.   Be sure to include periods of rest between energy-draining activities.   To help decrease the risk of infections, wash your hands regularly.   Avoid close contact with people who have a cold, the flu, or other infections.   Take your temperature as your doctor or nurse tells you, and whenever you feel like you may have a fever.   Use a soft toothbrush. Check with your nurse before using dental floss.   Be very careful when using knives or tools.   Use an electric shaver instead of a razor.   If you have a nose bleed, sit with your head tipped slightly forward. Apply pressure by lightly pinching the bridge of your nose between your thumb and forefinger. Call your doctor if you feel dizzy or faint or if the bleeding doesn't stop after 10 to 15 minutes.   Drink plenty of fluids (a minimum of eight glasses per day is recommended).   If you throw up or have loose bowel movements, you should drink more fluids so that you do not  become dehydrated (lack of water in the body from losing too much fluid).   To help with nausea and vomiting, eat small, frequent meals instead of three large meals a day. Choose foods and drinks that are at room temperature. Ask your  nurse or doctor about other helpful tips and medicine that is available to help, stop, or lessen these symptoms.   If you have diarrhea, eat low-fiber foods that are high in protein and calories and avoid foods that can irritate your digestive tracts or lead to cramping.   Ask your nurse or doctor about medicine that can lessen or stop your diarrhea.   Mouth care is very important. Your mouth care should consist of routine, gentle cleaning of your teeth or dentures and rinsing your mouth with a mixture of 1/2 teaspoon of salt in 8 ounces of water or 1/2 teaspoon of baking soda in 8 ounces of water. This should be done at least after each meal and at bedtime.   If you have mouth sores, avoid mouthwash that has alcohol. Also avoid alcohol and smoking because they can bother your mouth and throat.   Keeping your nails moisturized may help with brittleness.   To help with itching, moisturize your skin several times day.   Use sunscreen with SPF 30 or higher when you are outdoors even for a short time. Cover up when you are out in the sun. Wear wide-brimmed hats, long-sleeved shirts, and pants. Keep your neck, chest, and back covered. Wear dark sun glasses when in the sun or bright lights.   If you get a rash do not put anything on it unless your doctor or nurse says you may. Keep the area around the rash clean and dry. Ask your doctor for medicine if your rash bothers you.   Keeping your pain under control is important to your well-being. Please tell your doctor or nurse if you are experiencing pain.   Food and Drug Interactions   There are no known interactions of fluorouracil with food.   Check with your doctor or pharmacist about all other prescription medicines and over-the-counter medicines and dietary supplements (vitamins, minerals, herbs and others) you are taking before starting this medicine as there are known drug interactions with 5-fluoroucacil. Also, check with your doctor or  pharmacist before starting any new prescription or over-the-counter medicines, or dietary supplements to make sure that  there are no interactions.   When to Call the Doctor  Call your doctor or nurse if you have any of these symptoms and/or any new or unusual symptoms:   Fever of 100.4 F (38 C) or higher   Chills   Easy bleeding or bruising   Nose bleed that doesn't stop bleeding after 10-15 minutes   Trouble breathing   Feeling dizzy or lightheaded   Feeling that your heart is beating in a fast or not normal way (palpitations)   Chest pain or symptoms of a heart attack. Most heart attacks involve pain in the center of the chest that lasts more than a few minutes. The pain may go away and come back or it can be constant. It can feel like pressure, squeezing, fullness, or pain. Sometimes pain is felt in one or both arms, the back, neck, jaw, or stomach. If any of these symptoms last 2 minutes, call 911.   Confusion and/or agitation   Hallucinations   Trouble understanding or speaking   Loss of control of bowels or bladder   Blurry vision or changes in your eyesight   Headache that does not go away   Numbness or lack of strength to your arms, legs, face, or body   Nausea that stops you from eating or drinking and/or is not relieved by prescribed medicines   Throwing up    Diarrhea, 4 times in one day or diarrhea with lack of strength or a feeling of being dizzy   Pain in your mouth or throat that makes it hard to eat or drink   Pain along the digestive tract - especially if worse after eating   Blood in your vomit (bright red or coffee-ground) and/or stools (bright red, or black/tarry)   Coughing up blood   Tiredness that interferes with your daily activities   Painful, red, or swollen areas on your hands or feet or around your nails   A new rash or a rash that is not relieved by prescribed medicines   Develop sensitivity to sunlight/light   Numbness and/or  tingling of your hands and/or feet   Signs of allergic reaction: swelling of the face, feeling like your tongue or throat are swelling, trouble breathing, rash, itching, fever, chills, feeling dizzy, and/or feeling that your heart is beating in a fast or not normal way. If this happens, call 911 for emergency care.   If you think you are pregnant or may have impregnated your partner  Reproduction Warnings   Pregnancy warning: This drug may have harmful effects on the unborn baby. Women of child bearing potential should use effective methods of birth control during your cancer treatment and 3 months after treatment. Men with female partners of childbearing potential should use effective methods of birth control during your cancer treatment and for 3 months after your cancer treatment. Let your doctor know right away if you think you may be pregnant or may have impregnated your partner.   Breastfeeding warning: It is not known if this drug passes into breast milk. For this reason, Women should not breastfeed during treatment because this drug could enter the breast milk and cause harm to a breastfeeding baby.   Fertility warning: In men and women both, this drug may affect your ability to have children in the future. Talk with your doctor or nurse if you plan to have children. Ask for information on sperm or egg banking.   SELF CARE ACTIVITIES WHILE RECEIVING CHEMOTHERAPY:  Hydration Increase  your fluid intake 48 hours prior to treatment and drink at least 8 to 12 cups (64 ounces) of water/decaffeinated beverages per day after treatment. You can still have your cup of coffee or soda but these beverages do not count as part of your 8 to 12 cups that you need to drink daily. No alcohol intake.  Medications Continue taking your normal prescription medication as prescribed.  If you start any new herbal or new supplements please let us know first to make sure it is safe.  Mouth Care Have teeth  cleaned professionally before starting treatment. Keep dentures and partial plates clean. Use soft toothbrush and do not use mouthwashes that contain alcohol. Biotene is a good mouthwash that is available at most pharmacies or may be ordered by calling [phone]. Use warm salt water gargles (1 teaspoon salt per 1 quart warm water) before and after meals and at bedtime. If you need dental work, please let the doctor know before you go for your appointment so that we can coordinate the best possible time for you in regards to your chemo regimen. You need to also let your dentist know that you are actively taking chemo. We may need to do labs prior to your dental appointment.  Skin Care Always use sunscreen that has not expired and with SPF (Sun Protection Factor) of 50 or higher. Wear hats to protect your head from the sun. Remember to use sunscreen on your hands, ears, face, & feet.  Use good moisturizing lotions such as udder cream, eucerin, or even Vaseline. Some chemotherapies can cause dry skin, color changes in your skin and nails.    Avoid long, hot showers or baths. Use gentle, fragrance-free soaps and laundry detergent. Use moisturizers, preferably creams or ointments rather than lotions because the thicker consistency is better at preventing skin dehydration. Apply the cream or ointment within 15 minutes of showering. Reapply moisturizer at night, and moisturize your hands every time after you wash them.  Hair Loss (if your doctor says your hair will fall out)  If your doctor says that your hair is likely to fall out, decide before you begin chemo whether you want to wear a wig. You may want to shop before treatment to match your hair color. Hats, turbans, and scarves can also camouflage hair loss, although some people prefer to leave their heads uncovered. If you go bare-headed outdoors, be sure to use sunscreen on your scalp. Cut your hair short. It eases the inconvenience of shedding  lots of hair, but it also can reduce the emotional impact of watching your hair fall out. Don't perm or color your hair during chemotherapy. Those chemical treatments are already damaging to hair and can enhance hair loss. Once your chemo treatments are done and your hair has grown back, it's OK to resume dyeing or perming hair.  With chemotherapy, hair loss is almost always temporary. But when it grows back, it may be a different color or texture. In older adults who still had hair color before chemotherapy, the new growth may be completely gray.  Often, new hair is very fine and soft.  Infection Prevention Please wash your hands for at least 30 seconds using warm soapy water. Handwashing is the #1 way to prevent the spread of germs. Stay away from sick people or people who are getting over a cold. If you develop respiratory systems such as green/yellow mucus production or productive cough or persistent cough let us know and we will see if  you need an antibiotic. It is a good idea to keep a pair of gloves on when going into grocery stores/[employer] to decrease your risk of coming into contact with germs on the carts, etc. Carry alcohol hand gel with you at all times and use it frequently if out in public. If your temperature reaches 100.5 or higher please call the clinic and let us know.  If it is after hours or on the weekend please go to the ER if your temperature is over 100.5.  Please have your own personal thermometer at home to use.    Sex and bodily fluids If you are going to have sex, a condom must be used to protect the person that isn't taking chemotherapy. Chemo can decrease your libido (sex drive). For a few days after chemotherapy, chemotherapy can be excreted through your bodily fluids.  When using the toilet please close the lid and flush the toilet twice.  Do this for a few day after you have had chemotherapy.   Effects of chemotherapy on your sex life Some changes are simple and won't  last long. They won't affect your sex life permanently.  Sometimes you may feel: too tired not strong enough to be very active sick or sore  not in the mood anxious or low Your anxiety might not seem related to sex. For example, you may be worried about the cancer and how your treatment is going. Or you may be worried about money, or about how you family are coping with your illness.  These things can cause stress, which can affect your interest in sex. It's important to talk to your partner about how you feel.  Remember - the changes to your sex life don't usually last long. There's usually no medical reason to stop having sex during chemo. The drugs won't have any long term physical effects on your performance or enjoyment of sex. Cancer can't be passed on to your partner during sex  Contraception It's important to use reliable contraception during treatment. Avoid getting pregnant while you or your partner are having chemotherapy. This is because the drugs may harm the baby. Sometimes chemotherapy drugs can leave a man or woman infertile.  This means you would not be able to have children in the future. You might want to talk to someone about permanent infertility. It can be very difficult to learn that you may no longer be able to have children. Some people find counselling helpful. There might be ways to preserve your fertility, although this is easier for men than for women. You may want to speak to a fertility expert. You can talk about sperm banking or harvesting your eggs. You can also ask about other fertility options, such as donor eggs. If you have or have had breast cancer, your doctor might advise you not to take the contraceptive pill. This is because the hormones in it might affect the cancer. It is not known for sure whether or not chemotherapy drugs can be passed on through semen or secretions from the vagina. Because of this some doctors advise people to use a barrier method if you have  sex during treatment. This applies to vaginal, anal or oral sex. Generally, doctors advise a barrier method only for the time you are actually having the treatment and for about a week after your treatment. Advice like this can be worrying, but this does not mean that you have to avoid being intimate with your partner. You can still have close  contact with your partner and continue to enjoy sex.  Animals If you have cats or birds we just ask that you not change the litter or change the cage.  Please have someone else do this for you while you are on chemotherapy.   Food Safety During and After Cancer Treatment Food safety is important for people both during and after cancer treatment. Cancer and cancer treatments, such as chemotherapy, radiation therapy, and stem cell/bone marrow transplantation, often weaken the immune system. This makes it harder for your body to protect itself from foodborne illness, also called food poisoning. Foodborne illness is caused by eating food that contains harmful bacteria, parasites, or viruses.  Foods to avoid Some foods have a higher risk of becoming tainted with bacteria. These include: Unwashed fresh fruit and vegetables, especially leafy vegetables that can hide dirt and other contaminants Raw sprouts, such as alfalfa sprouts Raw or undercooked beef, especially ground beef, or other raw or undercooked meat and poultry Fatty, fried, or spicy foods immediately before or after treatment.  These can sit heavy on your stomach and make you feel nauseous. Raw or undercooked shellfish, such as oysters. Sushi and sashimi, which often contain raw fish.  Unpasteurized beverages, such as unpasteurized fruit juices, raw milk, raw yogurt, or cider Undercooked eggs, such as soft boiled, over easy, and poached; raw, unpasteurized eggs; or foods made with raw egg, such as homemade raw cookie dough and homemade mayonnaise  Simple steps for food safety  Shop smart. Do not buy  food stored or displayed in an unclean area. Do not buy bruised or damaged fruits or vegetables. Do not buy cans that have cracks, dents, or bulges. Pick up foods that can spoil at the end of your shopping trip and store them in a cooler on the way home.  Prepare and clean up foods carefully. Rinse all fresh fruits and vegetables under running water, and dry them with a clean towel or paper towel. Clean the top of cans before opening them. After preparing food, wash your hands for 20 seconds with hot water and soap. Pay special attention to areas between fingers and under nails. Clean your utensils and dishes with hot water and soap. Disinfect your kitchen and cutting boards using 1 teaspoon of liquid, unscented bleach mixed into 1 quart of water.    Dispose of old food. Eat canned and packaged food before its expiration date (the "use by" or "best before" date). Consume refrigerated leftovers within 3 to 4 days. After that time, throw out the food. Even if the food does not smell or look spoiled, it still may be unsafe. Some bacteria, such as Listeria, can grow even on foods stored in the refrigerator if they are kept for too long.  Take precautions when eating out. At restaurants, avoid buffets and salad bars where food sits out for a long time and comes in contact with many people. Food can become contaminated when someone with a virus, often a norovirus, or another "bug" handles it. Put any leftover food in a "to-go" container yourself, rather than having the server do it. And, refrigerate leftovers as soon as you get home. Choose restaurants that are clean and that are willing to prepare your food as you order it cooked.   AT HOME MEDICATIONS:  Compazine/Prochlorperazine '10mg'$  tablet. Take 1 tablet every 6 hours as needed for  nausea/vomiting. (This can make you sleepy)   EMLA cream. Apply a quarter size amount to port site 1 hour prior to chemo. Do not rub in. Cover with plastic wrap.    Diarrhea Sheet   If you are having loose stools/diarrhea, please purchase Imodium and begin taking as outlined:  At the first sign of poorly formed or loose stools you should begin taking Imodium (loperamide) 2 mg capsules.  Take two tablets ('4mg'$ ) followed by one tablet ('2mg'$ ) every 2 hours - DO NOT EXCEED 8 tablets in 24 hours.  If it is bedtime and you are having loose stools, take 2 tablets at bedtime, then 2 tablets every 4 hours until morning.   Always call the [facility] if you are having loose stools/diarrhea that you can't get under control.  Loose stools/diarrhea leads to dehydration (loss of water) in your body.  We have other options of trying to get the loose stools/diarrhea to stop but you must let us know!   Constipation Sheet  Colace - 100 mg capsules - take 2 capsules daily.  If this doesn't help then you can increase to 2 capsules twice daily.  Please call if the above does not work for you. Do not go more than 2 days without a bowel movement.  It is very important that you do not become constipated.  It will make you feel sick to your stomach (nausea) and can cause abdominal pain and vomiting.  Nausea Sheet   Compazine/Prochlorperazine '10mg'$  tablet. Take 1 tablet every 6 hours as needed for nausea/vomiting (This can make you drowsy).  If you are having persistent nausea (nausea that does not stop) please call the [facility] and let us know the amount of nausea that you are experiencing.  If you begin to vomit, you need to call the [facility] and if it is the weekend and you have vomited more than one time and can't get it to stop-go to the Emergency Room.  Persistent nausea/vomiting can lead to dehydration (loss of fluid in your body) and will make you feel very weak and unwell. Ice chips, sips of clear  liquids, foods that are at room temperature, crackers, and toast tend to be better tolerated.   SYMPTOMS TO REPORT AS SOON AS POSSIBLE AFTER TREATMENT:  FEVER GREATER THAN [address].5 F  CHILLS WITH OR WITHOUT FEVER  NAUSEA AND VOMITING THAT IS NOT CONTROLLED WITH YOUR NAUSEA MEDICATION  UNUSUAL SHORTNESS OF BREATH  UNUSUAL BRUISING OR BLEEDING  TENDERNESS IN MOUTH AND THROAT WITH OR WITHOUT   PRESENCE OF ULCERS  URINARY PROBLEMS  BOWEL PROBLEMS  UNUSUAL RASH      Wear comfortable clothing and clothing appropriate for easy access to any Portacath or PICC line. Let us know if there is anything that we can do to make your therapy better!    What to do if you need assistance after hours or on the weekends: CALL [phone].  HOLD on the line, do not hang up.  You will hear multiple messages but at the end you will be connected with a nurse triage line.  They will contact the doctor if necessary.  Most of the time they will be able to assist you.  Do not call the hospital operator.      I have been informed and understand all of the instructions given to me and have received a copy. I have been instructed to call  the clinic [phone] or my family physician as soon as possible for continued medical care, if indicated. I do not have any more questions at this time but understand that I may call the [facility] or the Patient Navigator at [phone] during office hours should I have questions or need assistance in obtaining follow-up care.

## 2022-05-24 NOTE — Telephone Encounter (Signed)
Talked with pt, we received FMLA ppw from [facility] but it was addressed to a Dr. Sylvan Cheese. She is not sure who this maybe. We discussed that I will hold onto this ppw and she will take her copy to her Oncologist appt on Monday to see if they will fill it out since they recommended her to stay out of work.

## 2022-05-29 ENCOUNTER — Inpatient Hospital Stay (HOSPITAL_COMMUNITY): Payer: BC Managed Care – PPO

## 2022-05-29 ENCOUNTER — Inpatient Hospital Stay (HOSPITAL_BASED_OUTPATIENT_CLINIC_OR_DEPARTMENT_OTHER): Payer: BC Managed Care – PPO | Admitting: Hematology

## 2022-05-29 ENCOUNTER — Encounter (HOSPITAL_COMMUNITY): Payer: Self-pay | Admitting: Hematology

## 2022-05-29 VITALS — BP 117/68 | HR 64 | Temp 97.3°F | Resp 18

## 2022-05-29 DIAGNOSIS — Z5111 Encounter for antineoplastic chemotherapy: Secondary | ICD-10-CM | POA: Diagnosis present

## 2022-05-29 DIAGNOSIS — Z95828 Presence of other vascular implants and grafts: Secondary | ICD-10-CM

## 2022-05-29 DIAGNOSIS — C20 Malignant neoplasm of rectum: Secondary | ICD-10-CM

## 2022-05-29 DIAGNOSIS — K921 Melena: Secondary | ICD-10-CM | POA: Diagnosis not present

## 2022-05-29 DIAGNOSIS — C787 Secondary malignant neoplasm of liver and intrahepatic bile duct: Secondary | ICD-10-CM | POA: Diagnosis not present

## 2022-05-29 DIAGNOSIS — Z800 Family history of malignant neoplasm of digestive organs: Secondary | ICD-10-CM | POA: Diagnosis not present

## 2022-05-29 DIAGNOSIS — Z5189 Encounter for other specified aftercare: Secondary | ICD-10-CM

## 2022-05-29 DIAGNOSIS — M545 Low back pain, unspecified: Secondary | ICD-10-CM | POA: Diagnosis not present

## 2022-05-29 DIAGNOSIS — Z803 Family history of malignant neoplasm of breast: Secondary | ICD-10-CM | POA: Diagnosis not present

## 2022-05-29 DIAGNOSIS — K769 Liver disease, unspecified: Secondary | ICD-10-CM

## 2022-05-29 DIAGNOSIS — R197 Diarrhea, unspecified: Secondary | ICD-10-CM | POA: Diagnosis not present

## 2022-05-29 DIAGNOSIS — Z806 Family history of leukemia: Secondary | ICD-10-CM | POA: Diagnosis not present

## 2022-05-29 DIAGNOSIS — F17210 Nicotine dependence, cigarettes, uncomplicated: Secondary | ICD-10-CM | POA: Diagnosis not present

## 2022-05-29 LAB — CBC WITH DIFFERENTIAL/PLATELET
Abs Immature Granulocytes: 0.09 10*3/uL — ABNORMAL HIGH (ref 0.00–0.07)
Basophils Absolute: 0.1 10*3/uL (ref 0.0–0.1)
Basophils Relative: 1 %
Eosinophils Absolute: 0.3 10*3/uL (ref 0.0–0.5)
Eosinophils Relative: 2 %
HCT: 33.1 % — ABNORMAL LOW (ref 36.0–46.0)
Hemoglobin: 11.1 g/dL — ABNORMAL LOW (ref 12.0–15.0)
Immature Granulocytes: 1 %
Lymphocytes Relative: 12 %
Lymphs Abs: 2 10*3/uL (ref 0.7–4.0)
MCH: 32.6 pg (ref 26.0–34.0)
MCHC: 33.5 g/dL (ref 30.0–36.0)
MCV: 97.1 fL (ref 80.0–100.0)
Monocytes Absolute: 0.5 10*3/uL (ref 0.1–1.0)
Monocytes Relative: 3 %
Neutro Abs: 14 10*3/uL — ABNORMAL HIGH (ref 1.7–7.7)
Neutrophils Relative %: 81 %
Platelets: 412 10*3/uL — ABNORMAL HIGH (ref 150–400)
RBC: 3.41 MIL/uL — ABNORMAL LOW (ref 3.87–5.11)
RDW: 12.7 % (ref 11.5–15.5)
WBC: 17 10*3/uL — ABNORMAL HIGH (ref 4.0–10.5)
nRBC: 0 % (ref 0.0–0.2)

## 2022-05-29 LAB — URINALYSIS, DIPSTICK ONLY
Bilirubin Urine: NEGATIVE
Glucose, UA: NEGATIVE mg/dL
Hgb urine dipstick: NEGATIVE
Ketones, ur: NEGATIVE mg/dL
Leukocytes,Ua: NEGATIVE
Nitrite: NEGATIVE
Protein, ur: NEGATIVE mg/dL
Specific Gravity, Urine: 1.017 (ref 1.005–1.030)
pH: 5 (ref 5.0–8.0)

## 2022-05-29 LAB — COMPREHENSIVE METABOLIC PANEL
ALT: 49 U/L — ABNORMAL HIGH (ref 0–44)
AST: 12 U/L — ABNORMAL LOW (ref 15–41)
Albumin: 3.5 g/dL (ref 3.5–5.0)
Alkaline Phosphatase: 37 U/L — ABNORMAL LOW (ref 38–126)
Anion gap: 8 (ref 5–15)
BUN: 16 mg/dL (ref 6–20)
CO2: 24 mmol/L (ref 22–32)
Calcium: 9.1 mg/dL (ref 8.9–10.3)
Chloride: 105 mmol/L (ref 98–111)
Creatinine, Ser: 0.65 mg/dL (ref 0.44–1.00)
GFR, Estimated: >60 mL/min (ref >60)
Glucose, Bld: 112 mg/dL — ABNORMAL HIGH (ref 70–99)
Potassium: 4 mmol/L (ref 3.5–5.1)
Sodium: 137 mmol/L (ref 135–145)
Total Bilirubin: 0.4 mg/dL (ref 0.3–1.2)
Total Protein: 6.4 g/dL — ABNORMAL LOW (ref 6.5–8.1)

## 2022-05-29 LAB — MAGNESIUM: Magnesium: 2 mg/dL (ref 1.7–2.4)

## 2022-05-29 MED ORDER — SODIUM CHLORIDE 0.9 % IV SOLN
150.0000 mg | Freq: Once | INTRAVENOUS | Status: AC
  Administered 2022-05-29: 150 mg via INTRAVENOUS
  Filled 2022-05-29: qty 150

## 2022-05-29 MED ORDER — PALONOSETRON HCL INJECTION 0.25 MG/5ML
0.2500 mg | Freq: Once | INTRAVENOUS | Status: AC
  Administered 2022-05-29: 0.25 mg via INTRAVENOUS
  Filled 2022-05-29: qty 5

## 2022-05-29 MED ORDER — SODIUM CHLORIDE 0.9 % IV SOLN
2400.0000 mg/m2 | INTRAVENOUS | Status: DC
  Administered 2022-05-29: 4150 mg via INTRAVENOUS
  Filled 2022-05-29: qty 83

## 2022-05-29 MED ORDER — SODIUM CHLORIDE 0.9 % IV SOLN
165.0000 mg/m2 | Freq: Once | INTRAVENOUS | Status: AC
  Administered 2022-05-29: 280 mg via INTRAVENOUS
  Filled 2022-05-29: qty 10

## 2022-05-29 MED ORDER — GABAPENTIN 300 MG PO CAPS: 300.0000 mg | ORAL_CAPSULE | Freq: Three times a day (TID) | ORAL | 3 refills | Status: DC

## 2022-05-29 MED ORDER — DEXTROSE 5 % IV SOLN: Freq: Once | INTRAVENOUS | Status: AC

## 2022-05-29 MED ORDER — OXALIPLATIN CHEMO INJECTION 100 MG/20ML
85.0000 mg/m2 | Freq: Once | INTRAVENOUS | Status: AC
  Administered 2022-05-29: 145 mg via INTRAVENOUS
  Filled 2022-05-29: qty 10

## 2022-05-29 MED ORDER — SODIUM CHLORIDE 0.9% FLUSH: 10.0000 mL | INTRAVENOUS | Status: DC | PRN

## 2022-05-29 MED ORDER — SODIUM CHLORIDE 0.9 % IV SOLN
10.0000 mg | Freq: Once | INTRAVENOUS | Status: AC
  Administered 2022-05-29: 10 mg via INTRAVENOUS
  Filled 2022-05-29: qty 10

## 2022-05-29 MED ORDER — LEUCOVORIN CALCIUM INJECTION 350 MG
400.0000 mg/m2 | Freq: Once | INTRAVENOUS | Status: AC
  Administered 2022-05-29: 688 mg via INTRAVENOUS
  Filled 2022-05-29: qty 34.4

## 2022-05-29 MED ORDER — ATROPINE SULFATE 1 MG/ML IV SOLN
0.5000 mg | Freq: Once | INTRAVENOUS | Status: AC | PRN
  Administered 2022-05-29: 0.5 mg via INTRAVENOUS
  Filled 2022-05-29: qty 1

## 2022-05-30 ENCOUNTER — Encounter (HOSPITAL_COMMUNITY): Payer: Self-pay

## 2022-05-31 ENCOUNTER — Telehealth (HOSPITAL_COMMUNITY): Payer: Self-pay | Admitting: Hematology

## 2022-05-31 ENCOUNTER — Encounter (HOSPITAL_COMMUNITY): Payer: Self-pay

## 2022-05-31 ENCOUNTER — Inpatient Hospital Stay (HOSPITAL_COMMUNITY): Payer: BC Managed Care – PPO

## 2022-05-31 VITALS — BP 105/67 | HR 102 | Temp 97.9°F | Resp 18

## 2022-05-31 DIAGNOSIS — C20 Malignant neoplasm of rectum: Secondary | ICD-10-CM

## 2022-05-31 DIAGNOSIS — Z95828 Presence of other vascular implants and grafts: Secondary | ICD-10-CM

## 2022-05-31 DIAGNOSIS — Z5111 Encounter for antineoplastic chemotherapy: Secondary | ICD-10-CM | POA: Diagnosis not present

## 2022-05-31 MED ORDER — SODIUM CHLORIDE 0.9% FLUSH
10.0000 mL | INTRAVENOUS | Status: DC | PRN
  Administered 2022-05-31: 10 mL

## 2022-05-31 MED ORDER — HEPARIN SOD (PORK) LOCK FLUSH 100 UNIT/ML IV SOLN
500.0000 [IU] | Freq: Once | INTRAVENOUS | Status: AC | PRN
  Administered 2022-05-31: 500 [IU]

## 2022-05-31 NOTE — Telephone Encounter (Signed)
Pc from [employer]. She is enrolling pt in [place] assist program.  REF# [id number] [phone] P

## 2022-05-31 NOTE — Progress Notes (Signed)
Chemo pump disconnected, Port flushed with good blood return noted. No bruising or swelling at site. Bandaid applied and patient discharged in satisfactory condition. VVS stable with no signs or symptoms of distressed noted.  

## 2022-05-31 NOTE — Patient Instructions (Signed)
[facility]  Discharge Instructions: Thank you for choosing [facility] to provide your oncology and hematology care.  If you have a lab appointment with the [facility], please come in thru the Main Entrance and check in at the main information desk.  Wear comfortable clothing and clothing appropriate for easy access to any Portacath or PICC line.   We strive to give you quality time with your provider. You may need to reschedule your appointment if you arrive late (15 or more minutes).  Arriving late affects you and other patients whose appointments are after yours.  Also, if you miss three or more appointments without notifying the office, you may be dismissed from the clinic at the provider's discretion.      For prescription refill requests, have your pharmacy contact our office and allow 72 hours for refills to be completed.    Today your chemo pump was disconnected and port was flushed, return as scheduled.   To help prevent nausea and vomiting after your treatment, we encourage you to take your nausea medication as directed.  BELOW ARE SYMPTOMS THAT SHOULD BE REPORTED IMMEDIATELY: *FEVER GREATER THAN 100.4 F (38 C) OR HIGHER *CHILLS OR SWEATING *NAUSEA AND VOMITING THAT IS NOT CONTROLLED WITH YOUR NAUSEA MEDICATION *UNUSUAL SHORTNESS OF BREATH *UNUSUAL BRUISING OR BLEEDING *URINARY PROBLEMS (pain or burning when urinating, or frequent urination) *BOWEL PROBLEMS (unusual diarrhea, constipation, pain near the anus) TENDERNESS IN MOUTH AND THROAT WITH OR WITHOUT PRESENCE OF ULCERS (sore throat, sores in mouth, or a toothache) UNUSUAL RASH, SWELLING OR PAIN  UNUSUAL VAGINAL DISCHARGE OR ITCHING   Items with * indicate a potential emergency and should be followed up as soon as possible or go to the Emergency Department if any problems should occur.  Please show the CHEMOTHERAPY ALERT CARD or IMMUNOTHERAPY ALERT CARD at check-in to the Emergency Department  and triage nurse.  Should you have questions after your visit or need to cancel or reschedule your appointment, please contact [facility] [phone]  and follow the prompts.  Office hours are 8:00 a.m. to 4:30 p.m. Monday - Friday. Please note that voicemails left after 4:00 p.m. may not be returned until the following business day.  We are closed weekends and major holidays. You have access to a nurse at all times for urgent questions. Please call the main number to the clinic [phone] and follow the prompts.  For any non-urgent questions, you may also contact your provider using MyChart. We now offer e-Visits for anyone 16 and older to request care online for non-urgent symptoms. For details visit [email or web address].   Also download the MyChart app! Go to the app store, search "MyChart", open the app, select [facility], and log in with your MyChart username and password.  Masks are optional in the cancer centers. If you would like for your care team to wear a mask while they are taking care of you, please let them know. For doctor visits, patients may have with them one support person who is at least 43 years old. At this time, visitors are not allowed in the infusion area.

## 2022-06-01 ENCOUNTER — Telehealth (HOSPITAL_COMMUNITY): Payer: Self-pay

## 2022-06-01 NOTE — Telephone Encounter (Signed)
Patient called for 24hr follow up, no response from patient. 

## 2022-06-12 ENCOUNTER — Inpatient Hospital Stay (HOSPITAL_COMMUNITY): Payer: BC Managed Care – PPO | Attending: Hematology

## 2022-06-12 ENCOUNTER — Inpatient Hospital Stay (HOSPITAL_COMMUNITY): Payer: BC Managed Care – PPO

## 2022-06-12 ENCOUNTER — Inpatient Hospital Stay (HOSPITAL_BASED_OUTPATIENT_CLINIC_OR_DEPARTMENT_OTHER): Payer: BC Managed Care – PPO | Admitting: Hematology

## 2022-06-12 DIAGNOSIS — C20 Malignant neoplasm of rectum: Secondary | ICD-10-CM | POA: Insufficient documentation

## 2022-06-12 DIAGNOSIS — K769 Liver disease, unspecified: Secondary | ICD-10-CM

## 2022-06-12 DIAGNOSIS — Z79899 Other long term (current) drug therapy: Secondary | ICD-10-CM | POA: Diagnosis not present

## 2022-06-12 DIAGNOSIS — C787 Secondary malignant neoplasm of liver and intrahepatic bile duct: Secondary | ICD-10-CM | POA: Diagnosis not present

## 2022-06-12 DIAGNOSIS — Z5111 Encounter for antineoplastic chemotherapy: Secondary | ICD-10-CM | POA: Diagnosis not present

## 2022-06-12 DIAGNOSIS — Z95828 Presence of other vascular implants and grafts: Secondary | ICD-10-CM

## 2022-06-12 LAB — CBC WITH DIFFERENTIAL/PLATELET
Abs Immature Granulocytes: 0.02 10*3/uL (ref 0.00–0.07)
Basophils Absolute: 0.1 10*3/uL (ref 0.0–0.1)
Basophils Relative: 1 %
Eosinophils Absolute: 0.8 10*3/uL — ABNORMAL HIGH (ref 0.0–0.5)
Eosinophils Relative: 11 %
HCT: 33.8 % — ABNORMAL LOW (ref 36.0–46.0)
Hemoglobin: 11.2 g/dL — ABNORMAL LOW (ref 12.0–15.0)
Immature Granulocytes: 0 %
Lymphocytes Relative: 31 %
Lymphs Abs: 2.1 10*3/uL (ref 0.7–4.0)
MCH: 31.8 pg (ref 26.0–34.0)
MCHC: 33.1 g/dL (ref 30.0–36.0)
MCV: 96 fL (ref 80.0–100.0)
Monocytes Absolute: 0.4 10*3/uL (ref 0.1–1.0)
Monocytes Relative: 7 %
Neutro Abs: 3.4 10*3/uL (ref 1.7–7.7)
Neutrophils Relative %: 50 %
Platelets: 292 10*3/uL (ref 150–400)
RBC: 3.52 MIL/uL — ABNORMAL LOW (ref 3.87–5.11)
RDW: 13 % (ref 11.5–15.5)
WBC: 6.8 10*3/uL (ref 4.0–10.5)
nRBC: 0 % (ref 0.0–0.2)

## 2022-06-12 LAB — COMPREHENSIVE METABOLIC PANEL
ALT: 30 U/L (ref 0–44)
AST: 16 U/L (ref 15–41)
Albumin: 3.4 g/dL — ABNORMAL LOW (ref 3.5–5.0)
Alkaline Phosphatase: 48 U/L (ref 38–126)
Anion gap: 5 (ref 5–15)
BUN: 13 mg/dL (ref 6–20)
CO2: 23 mmol/L (ref 22–32)
Calcium: 8.6 mg/dL — ABNORMAL LOW (ref 8.9–10.3)
Chloride: 109 mmol/L (ref 98–111)
Creatinine, Ser: 0.54 mg/dL (ref 0.44–1.00)
GFR, Estimated: >60 mL/min (ref >60)
Glucose, Bld: 90 mg/dL (ref 70–99)
Potassium: 3.9 mmol/L (ref 3.5–5.1)
Sodium: 137 mmol/L (ref 135–145)
Total Bilirubin: 0.4 mg/dL (ref 0.3–1.2)
Total Protein: 6 g/dL — ABNORMAL LOW (ref 6.5–8.1)

## 2022-06-12 LAB — MAGNESIUM: Magnesium: 1.9 mg/dL (ref 1.7–2.4)

## 2022-06-12 MED ORDER — SODIUM CHLORIDE 0.9 % IV SOLN
10.0000 mg | Freq: Once | INTRAVENOUS | Status: AC
  Administered 2022-06-12: 10 mg via INTRAVENOUS
  Filled 2022-06-12: qty 10

## 2022-06-12 MED ORDER — DEXTROSE 5 % IV SOLN: Freq: Once | INTRAVENOUS | Status: AC

## 2022-06-12 MED ORDER — SODIUM CHLORIDE 0.9 % IV SOLN: Freq: Once | INTRAVENOUS | Status: AC

## 2022-06-12 MED ORDER — SODIUM CHLORIDE 0.9 % IV SOLN
165.0000 mg/m2 | Freq: Once | INTRAVENOUS | Status: AC
  Administered 2022-06-12: 280 mg via INTRAVENOUS
  Filled 2022-06-12: qty 10

## 2022-06-12 MED ORDER — OXALIPLATIN CHEMO INJECTION 100 MG/20ML
85.0000 mg/m2 | Freq: Once | INTRAVENOUS | Status: AC
  Administered 2022-06-12: 145 mg via INTRAVENOUS
  Filled 2022-06-12: qty 20

## 2022-06-12 MED ORDER — SODIUM CHLORIDE 0.9 % IV SOLN
2400.0000 mg/m2 | INTRAVENOUS | Status: DC
  Administered 2022-06-12: 4150 mg via INTRAVENOUS
  Filled 2022-06-12: qty 83

## 2022-06-12 MED ORDER — SODIUM CHLORIDE 0.9 % IV SOLN
150.0000 mg | Freq: Once | INTRAVENOUS | Status: AC
  Administered 2022-06-12: 150 mg via INTRAVENOUS
  Filled 2022-06-12: qty 5

## 2022-06-12 MED ORDER — PALONOSETRON HCL INJECTION 0.25 MG/5ML
0.2500 mg | Freq: Once | INTRAVENOUS | Status: AC
  Administered 2022-06-12: 0.25 mg via INTRAVENOUS

## 2022-06-12 MED ORDER — ATROPINE SULFATE 1 MG/ML IV SOLN
0.5000 mg | Freq: Once | INTRAVENOUS | Status: AC
  Administered 2022-06-12: 0.5 mg via INTRAVENOUS
  Filled 2022-06-12: qty 1

## 2022-06-12 MED ORDER — LEUCOVORIN CALCIUM INJECTION 350 MG
400.0000 mg/m2 | Freq: Once | INTRAVENOUS | Status: AC
  Administered 2022-06-12: 688 mg via INTRAVENOUS
  Filled 2022-06-12: qty 34.4

## 2022-06-12 MED ORDER — SODIUM CHLORIDE 0.9 % IV SOLN
5.0000 mg/kg | Freq: Once | INTRAVENOUS | Status: AC
  Administered 2022-06-12: 300 mg via INTRAVENOUS
  Filled 2022-06-12: qty 12

## 2022-06-12 NOTE — Progress Notes (Signed)
Patients port flushed without difficulty.  Good blood return noted with no bruising or swelling noted at site.  Stable during access and blood draw.  Patient to remain accessed for treatment. 

## 2022-06-12 NOTE — Progress Notes (Signed)
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, a 41 y.o. female, returns for routine follow-up and consideration for next cycle of chemotherapy. Terri Mckinney was last seen on 05/29/2022.  Due for cycle #2 of FOLFOXIRI + Bevacizumab today.   Overall, she tells me she has been feeling pretty well. She denies tingling/numbness and abdominal cramping. Her rectal pain has improved. She is taking 1 tablet of hydrocodone in the morning and 2 tablets at night. She has lost 4 lbs since her last visit. She reports she is eating well.   Overall, she feels ready for next cycle of chemo today.    REVIEW OF SYSTEMS:  Review of Systems  Constitutional:  Positive for unexpected weight change (-4 lbs). Negative for appetite change and fatigue.  Respiratory:  Positive for cough (smoker).   Gastrointestinal:  Positive for constipation and rectal pain (4/10 - improved).  Psychiatric/Behavioral:  Positive for sleep disturbance.   All other systems reviewed and are negative.   PAST MEDICAL/SURGICAL HISTORY:  Past Medical History:  Diagnosis Date   Cancer [facility])    Depression, major, single episode, mild ([place])    Port-A-Cath in place 05/24/2022    Past Surgical History:  Procedure Laterality Date   BIOPSY  03/21/2022   Procedure: BIOPSY;  Surgeon: [name], DO;  Location[facility];  Service: Endoscopy;;   COLONOSCOPY WITH PROPOFOL N/A 03/21/2022   Procedure: COLONOSCOPY WITH PROPOFOL;  Surgeon: [name], DO;  Location: [facility] ENDO SUITE;  Service: Endoscopy;  Laterality: N/A;  8:30am   FLEXIBLE SIGMOIDOSCOPY N/A 05/19/2022   Procedure: FLEXIBLE SIGMOIDOSCOPY WITH TATTOO INJECTION;  Surgeon: Leighton Ruff, MD;  Location: [facility] ORS;  Service: General;  Laterality: N/A;   LAPAROSCOPIC DIVERTED COLOSTOMY N/A 05/19/2022   Procedure: LAPAROSCOPIC DIVERTED OSTOMY;  Surgeon: Leighton Ruff, MD;  Location: [facility] ORS;  Service: General;  Laterality: N/A;   PORTACATH PLACEMENT Right 05/19/2022   Procedure: INSERTION PORT-A-CATH;  Surgeon: Leighton Ruff, MD;  Location: [facility] ORS;  Service: General;  Laterality: Right;    SOCIAL HISTORY:  Social History   Socioeconomic History   Marital status: Married    Spouse name: Not on file   Number of children: Not on file   Years of education: Not on file   Highest education level: Not on file  Occupational History   Not on file  Tobacco Use   Smoking status: Every Day    Packs/day: 2.00    Years: 27.00    Total pack years: 54.00    Types: Cigarettes   Smokeless tobacco: Never  Vaping Use   Vaping Use: Never used  Substance and Sexual Activity   Alcohol use: No   Drug use:  No   Sexual activity: Yes    Birth control/protection: Injection  Other Topics Concern   Not on file  Social History Narrative   Not on file   Social Determinants of Health   Financial Resource Strain: Not on file  Food Insecurity: Not on file  Transportation Needs: Not on file  Physical Activity: Not on file  Stress: Not on file  Social Connections: Not on file  Intimate Partner Violence: Not on file    FAMILY HISTORY:  Family History  Problem Relation Age of Onset   Heart disease Mother     Diabetes Mother    Hearing loss Father    Heart disease Father    Hyperlipidemia Father    Hypertension Father    Stroke Father    Arthritis Father    Diabetes Father    Learning disabilities Brother    Diabetes Maternal Grandmother    Heart disease Maternal Grandmother    Diabetes Maternal Grandfather    Diabetes Paternal Grandmother    Diabetes Paternal Grandfather     CURRENT MEDICATIONS:  Current Outpatient Medications  Medication Sig Dispense Refill   Bevacizumab (AVASTIN IV) Inject into the vein every 14 (fourteen) days.     cetirizine (ZYRTEC) 10 MG tablet Take 10 mg by mouth daily.     famotidine (PEPCID) 20 MG tablet Take 1 tablet (20 mg total) by mouth 2 (two) times daily for 14 days. 28 tablet 0   fluorouracil CALGB 29562 2,400 mg/m2 in sodium chloride 0.9 % 150 mL Inject 2,400 mg/m2 into the vein over 48 hr.     FLUOROURACIL IV Inject into the vein every 14 (fourteen) days.     gabapentin (NEURONTIN) 300 MG capsule Take 1 capsule (300 mg total) by mouth 3 (three) times daily. 30 capsule 3   HYDROcodone-acetaminophen (NORCO) 5-325 MG tablet Take 1 tablet by mouth every 8 (eight) hours as needed for moderate pain. 90 tablet 0   ibuprofen (ADVIL) 200 MG tablet Take 1,000 mg by mouth every 6 (six) hours as needed for moderate pain.     IRINOTECAN HCL IV Inject into the vein every 14 (fourteen) days.     LEUCOVORIN CALCIUM IV Inject into the vein every 14 (fourteen) days.     lidocaine-prilocaine (EMLA) cream Apply small amount to port a cath site and cover with plastic wrap 1 hour prior to infusion appointments 30 g 3   medroxyPROGESTERone (DEPO-PROVERA) 150 MG/ML injection INJECT 1 ML (150 MG TOTAL) INTO THE MUSCLE EVERY 3 (THREE) MONTHS 1 mL 2   Multiple Vitamin (MULTIVITAMIN) tablet Take 1 tablet by mouth daily.     OXALIPLATIN IV Inject into the vein every 14 (fourteen) days.     predniSONE (DELTASONE) 20 MG tablet 2 po at sametime daily for 5 days- start tomorrow  (Patient not taking: Reported on 05/29/2022) 10 tablet 0   prochlorperazine (COMPAZINE) 10 MG tablet Take 1 tablet (10 mg total) by mouth every 6 (six) hours as needed (Nausea or vomiting). 30 tablet 1   No current facility-administered medications for this visit.    ALLERGIES:  Allergies  Allergen Reactions   Wellbutrin [Bupropion]     insomnia    PHYSICAL EXAM:  Performance status (ECOG): 0 - Asymptomatic  There were no vitals filed for this visit. Wt Readings from Last 3 Encounters:  06/12/22 134 lb 4 oz (60.9 kg)  05/29/22 138 lb 12.8 oz (63 kg)  05/24/22 130 lb 3.2 oz (59.1 kg)   Physical  Exam Vitals reviewed.  Constitutional:      Appearance: Normal appearance.  Cardiovascular:     Rate and Rhythm: Normal rate and regular rhythm.     Pulses: Normal pulses.     Heart sounds: Normal heart sounds.  Pulmonary:     Effort: Pulmonary effort is normal.     Breath sounds: Normal breath sounds.  Neurological:     General: No focal deficit present.     Mental Status: She is alert and oriented to person, place, and time.  Psychiatric:        Mood and Affect: Mood normal.        Behavior: Behavior normal.     LABORATORY DATA:  I have reviewed the labs as listed.     Latest Ref Rng & Units 06/12/2022    8:28 AM 05/29/2022    7:53 AM 05/21/2022    4:23 AM  CBC  WBC 4.0 - 10.5 K/uL 6.8  17.0  11.2   Hemoglobin 12.0 - 15.0 g/dL 11.2  11.1  10.2   Hematocrit 36.0 - 46.0 % 33.8  33.1  31.1   Platelets 150 - 400 K/uL 292  412  270       Latest Ref Rng & Units 06/12/2022    8:28 AM 05/29/2022    7:53 AM 05/21/2022    4:23 AM  CMP  Glucose 70 - 99 mg/dL 90  112  87   BUN 6 - 20 mg/dL _0 Creatinine 0.44 - 1.00 mg/dL 0.54  0.65  0.64   Sodium 135 - 145 mmol/L 137  137  140   Potassium 3.5 - 5.1 mmol/L 3.9  4.0  4.0   Chloride 98 - 111 mmol/L 109  105  110   CO2 22 - 32 mmol/L _1 Calcium 8.9 - 10.3 mg/dL 8.6  9.1  8.4   Total Protein 6.5 - 8.1 g/dL 6.0   6.4    Total Bilirubin 0.3 - 1.2 mg/dL 0.4  0.4    Alkaline Phos 38 - 126 U/L 48  37    AST 15 - 41 U/L 16  12    ALT 0 - 44 U/L 30  49      DIAGNOSTIC IMAGING:  I have independently reviewed the scans and discussed with the patient. DG Chest Port 1 View  Result Date: 05/19/2022 CLINICAL DATA:  Right chest port placement EXAM: PORTABLE CHEST 1 VIEW COMPARISON:  None Available. FINDINGS: Cardiac size is within normal limits. There are no signs of pulmonary edema or focal pulmonary consolidation. There is no pleural effusion or pneumothorax. Right IJ chest port is seen with its tip in the superior vena cava. IMPRESSION: Tip of right IJ chest port is seen in the superior vena cava. Lung fields are clear of any infiltrates or pulmonary edema. There is no pneumothorax. Electronically Signed   By: Elmer Picker M.D.   On: 05/19/2022 16:49   DG C-Arm 1-60 Min-No Report  Result Date: 05/19/2022 Fluoroscopy was utilized by the requesting physician.  No radiographic interpretation.     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  Stools have become bloody/mucousy lately. - She also reported pain in the tailbone region since March 2023.  She also has right-sided lower back pain. - 50 pound weight loss in the last 9 months, part of  weight loss was intentional.  She cut back on eating sweets and lost taste to sweets after COVID infection. - CT [facility] with contrast on 03/08/2022: Irregular circumferential masslike wall thickening of sigmoid colon/rectum with adjacent perirectal adenopathy.  Multiple small hypodense lesions in the liver, largest 2.9 cm in the central aspect of the liver, question metastatic disease. - Colonoscopy on 03/21/2022 by Dr. Abbey Chatters: Fungating infiltrative nearly completely obstructing mass in the rectosigmoid colon, mass was circumferential measuring 4 cm in length. - Pathology: Rectal mass  biopsy consistent with invasive moderately differentiated adenocarcinoma.  As there is very scant invasive tumor, MSI studies were deferred. - PET scan (03/23/2022): Hypermetabolic rectal primary long-segment with SUV 14.3.  Left posterior perirectal lymph node 7 mm with SUV 2.9.  Multiple tiny foci of hepatic hypermetabolism. - MRI of the liver (04/01/2022): Multiple small hypovascular rim-enhancing liver lesions, predominantly in the right hepatic lobe measuring up to 1.1 cm.  3.3 cm hypervascular mass in the central liver, most consistent with FNH/hepatic adenoma. - Liver biopsy (04/20/2022): Metastatic moderately differentiated colonic adenocarcinoma with mucinous features - NGS testing shows K-ras G12 R mutation, PIK3CA exon 21 mutation.  MS-stable.  TMB-low. - Cycle 1 of FOLFOXIRI on 05/29/2022, bevacizumab added during cycle 2    Social/family history: - She is separated and is seen today with her daughter.  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  She is current active smoker, 1 pack/day for 27 years.  Denies drinking alcohol. - Paternal aunt had colon cancer.  Maternal cousin has breast cancer.  Maternal uncle had leukemia and maternal cousin had leukemia.   PLAN:  Metastatic rectal cancer to the liver: - She has tolerated cycle 1 reasonably well. - She lost about 4 pounds despite eating well. - She reported cold sensitivity lasted about 10 days.  Denies any tingling or numbness in extremities.  No other GI side effects noted. - Reviewed labs today which showed normal LFTs with albumin 3.4.  Creatinine and electrolytes normal.  White count and platelet count is adequate.  UA was negative for protein. - Proceed with cycle 2 today.  I have recommended adding bevacizumab with chemotherapy.  We discussed side effects in detail. - RTC 2 weeks for follow-up with repeat labs and treatment.  2.  Sacral/right lower back pain: - Continue hydrocodone 5/325 1 tablet in the morning, 1 tablet at noon and  2 tablets at bedtime. - Continue gabapentin 300 mg at bedtime.  Pain is better controlled.   Orders placed this encounter:  No orders of the defined types were placed in this encounter.    Derek Jack, MD [facility] [phone]   I, Thana Ates, am acting as a scribe for Dr. Derek Jack.  I, Derek Jack MD, have reviewed the above documentation for accuracy and completeness, and I agree with the above.

## 2022-06-12 NOTE — Patient Instructions (Signed)
[facility] at [facility] Discharge Instructions   You were seen and examined today by Dr. Delton Coombes.  He reviewed your lab results which are normal/stable.   We will proceed with your treatment today. We are adding a antibody today to help treat the cancer. It is called Avastin. It works by cutting off the blood supply to the cancer.   Return as scheduled in 2 weeks.    Thank you for choosing [facility] at [facility] to provide your oncology and hematology care.  To afford each patient quality time with our provider, please arrive at least 15 minutes before your scheduled appointment time.   If you have a lab appointment with the [facility] please come in thru the Main Entrance and check in at the main information desk.  You need to re-schedule your appointment should you arrive 10 or more minutes late.  We strive to give you quality time with our providers, and arriving late affects you and other patients whose appointments are after yours.  Also, if you no show three or more times for appointments you may be dismissed from the clinic at the providers discretion.     Again, thank you for choosing [facility].  Our hope is that these requests will decrease the amount of time that you wait before being seen by our physicians.       _____________________________________________________________  Should you have questions after your visit to [facility], please contact our office at [phone] and follow the prompts.  Our office hours are 8:00 a.m. and 4:30 p.m. Monday - Friday.  Please note that voicemails left after 4:00 p.m. may not be returned until the following business day.  We are closed weekends and major holidays.  You do have access to a nurse 24-7, just call the main number to the clinic [phone] and do not press any options, hold on the line and a nurse will answer the phone.    For prescription  refill requests, have your pharmacy contact our office and allow 72 hours.    Due to Covid, you will need to wear a mask upon entering the hospital. If you do not have a mask, a mask will be given to you at the Main Entrance upon arrival. For doctor visits, patients may have 1 support person age 51 or older with them. For treatment visits, patients can not have anyone with them due to social distancing guidelines and our immunocompromised population.

## 2022-06-14 ENCOUNTER — Inpatient Hospital Stay (HOSPITAL_COMMUNITY): Payer: BC Managed Care – PPO

## 2022-06-14 VITALS — BP 109/56 | HR 72 | Temp 97.9°F | Resp 18

## 2022-06-14 DIAGNOSIS — Z5111 Encounter for antineoplastic chemotherapy: Secondary | ICD-10-CM | POA: Diagnosis not present

## 2022-06-14 DIAGNOSIS — Z95828 Presence of other vascular implants and grafts: Secondary | ICD-10-CM

## 2022-06-14 DIAGNOSIS — C20 Malignant neoplasm of rectum: Secondary | ICD-10-CM

## 2022-06-14 MED ORDER — SODIUM CHLORIDE 0.9% FLUSH
10.0000 mL | INTRAVENOUS | Status: DC | PRN
  Administered 2022-06-14: 10 mL

## 2022-06-14 MED ORDER — HEPARIN SOD (PORK) LOCK FLUSH 100 UNIT/ML IV SOLN
500.0000 [IU] | Freq: Once | INTRAVENOUS | Status: AC | PRN
  Administered 2022-06-14: 500 [IU]

## 2022-06-14 NOTE — Patient Instructions (Signed)
[facility]  Discharge Instructions: Thank you for choosing [facility] to provide your oncology and hematology care.  If you have a lab appointment with the [facility], please come in thru the Main Entrance and check in at the main information desk.  Wear comfortable clothing and clothing appropriate for easy access to any Portacath or PICC line.   We strive to give you quality time with your provider. You may need to reschedule your appointment if you arrive late (15 or more minutes).  Arriving late affects you and other patients whose appointments are after yours.  Also, if you miss three or more appointments without notifying the office, you may be dismissed from the clinic at the provider's discretion.      For prescription refill requests, have your pharmacy contact our office and allow 72 hours for refills to be completed.    Today your pump was disconnected, return as scheduled.   To help prevent nausea and vomiting after your treatment, we encourage you to take your nausea medication as directed.  BELOW ARE SYMPTOMS THAT SHOULD BE REPORTED IMMEDIATELY: *FEVER GREATER THAN 100.4 F (38 C) OR HIGHER *CHILLS OR SWEATING *NAUSEA AND VOMITING THAT IS NOT CONTROLLED WITH YOUR NAUSEA MEDICATION *UNUSUAL SHORTNESS OF BREATH *UNUSUAL BRUISING OR BLEEDING *URINARY PROBLEMS (pain or burning when urinating, or frequent urination) *BOWEL PROBLEMS (unusual diarrhea, constipation, pain near the anus) TENDERNESS IN MOUTH AND THROAT WITH OR WITHOUT PRESENCE OF ULCERS (sore throat, sores in mouth, or a toothache) UNUSUAL RASH, SWELLING OR PAIN  UNUSUAL VAGINAL DISCHARGE OR ITCHING   Items with * indicate a potential emergency and should be followed up as soon as possible or go to the Emergency Department if any problems should occur.  Please show the CHEMOTHERAPY ALERT CARD or IMMUNOTHERAPY ALERT CARD at check-in to the Emergency Department and triage nurse.  Should  you have questions after your visit or need to cancel or reschedule your appointment, please contact [facility] [phone]  and follow the prompts.  Office hours are 8:00 a.m. to 4:30 p.m. Monday - Friday. Please note that voicemails left after 4:00 p.m. may not be returned until the following business day.  We are closed weekends and major holidays. You have access to a nurse at all times for urgent questions. Please call the main number to the clinic [phone] and follow the prompts.  For any non-urgent questions, you may also contact your provider using MyChart. We now offer e-Visits for anyone 23 and older to request care online for non-urgent symptoms. For details visit [email or web address].   Also download the MyChart app! Go to the app store, search "MyChart", open the app, select , and log in with your MyChart username and password.  Masks are optional in the cancer centers. If you would like for your care team to wear a mask while they are taking care of you, please let them know. For doctor visits, patients may have with them one support person who is at least 43 years old. At this time, visitors are not allowed in the infusion area.

## 2022-06-14 NOTE — Progress Notes (Signed)
Patient presents today for chemo pump disconnection, Port flushed with good blood return noted. No bruising or swelling at site. Bandaid applied and patient discharged in satisfactory condition. VVS stable with no signs or symptoms of distressed noted.

## 2022-06-26 ENCOUNTER — Other Ambulatory Visit: Payer: Self-pay

## 2022-06-26 ENCOUNTER — Other Ambulatory Visit (HOSPITAL_COMMUNITY): Payer: Self-pay | Admitting: *Deleted

## 2022-06-26 ENCOUNTER — Inpatient Hospital Stay (HOSPITAL_COMMUNITY): Payer: BC Managed Care – PPO | Admitting: Hematology

## 2022-06-26 ENCOUNTER — Inpatient Hospital Stay (HOSPITAL_COMMUNITY): Payer: BC Managed Care – PPO

## 2022-06-26 VITALS — BP 105/56 | HR 63 | Temp 98.3°F | Resp 18

## 2022-06-26 DIAGNOSIS — C20 Malignant neoplasm of rectum: Secondary | ICD-10-CM

## 2022-06-26 DIAGNOSIS — K769 Liver disease, unspecified: Secondary | ICD-10-CM

## 2022-06-26 DIAGNOSIS — Z95828 Presence of other vascular implants and grafts: Secondary | ICD-10-CM

## 2022-06-26 DIAGNOSIS — Z5111 Encounter for antineoplastic chemotherapy: Secondary | ICD-10-CM | POA: Diagnosis not present

## 2022-06-26 LAB — URINALYSIS, DIPSTICK ONLY
Bilirubin Urine: NEGATIVE
Glucose, UA: NEGATIVE mg/dL
Hgb urine dipstick: NEGATIVE
Ketones, ur: NEGATIVE mg/dL
Leukocytes,Ua: NEGATIVE
Nitrite: NEGATIVE
Protein, ur: NEGATIVE mg/dL
Specific Gravity, Urine: 1.02 (ref 1.005–1.030)
pH: 5 (ref 5.0–8.0)

## 2022-06-26 LAB — CBC WITH DIFFERENTIAL/PLATELET
Abs Immature Granulocytes: 0.01 10*3/uL (ref 0.00–0.07)
Basophils Absolute: 0 10*3/uL (ref 0.0–0.1)
Basophils Relative: 1 %
Eosinophils Absolute: 0.6 10*3/uL — ABNORMAL HIGH (ref 0.0–0.5)
Eosinophils Relative: 9 %
HCT: 35.9 % — ABNORMAL LOW (ref 36.0–46.0)
Hemoglobin: 11.9 g/dL — ABNORMAL LOW (ref 12.0–15.0)
Immature Granulocytes: 0 %
Lymphocytes Relative: 35 %
Lymphs Abs: 2.3 10*3/uL (ref 0.7–4.0)
MCH: 31.8 pg (ref 26.0–34.0)
MCHC: 33.1 g/dL (ref 30.0–36.0)
MCV: 96 fL (ref 80.0–100.0)
Monocytes Absolute: 0.4 10*3/uL (ref 0.1–1.0)
Monocytes Relative: 5 %
Neutro Abs: 3.3 10*3/uL (ref 1.7–7.7)
Neutrophils Relative %: 50 %
Platelets: 219 10*3/uL (ref 150–400)
RBC: 3.74 MIL/uL — ABNORMAL LOW (ref 3.87–5.11)
RDW: 13 % (ref 11.5–15.5)
WBC: 6.6 10*3/uL (ref 4.0–10.5)
nRBC: 0 % (ref 0.0–0.2)

## 2022-06-26 LAB — COMPREHENSIVE METABOLIC PANEL
ALT: 24 U/L (ref 0–44)
AST: 17 U/L (ref 15–41)
Albumin: 3.6 g/dL (ref 3.5–5.0)
Alkaline Phosphatase: 59 U/L (ref 38–126)
Anion gap: 6 (ref 5–15)
BUN: 14 mg/dL (ref 6–20)
CO2: 24 mmol/L (ref 22–32)
Calcium: 9.1 mg/dL (ref 8.9–10.3)
Chloride: 108 mmol/L (ref 98–111)
Creatinine, Ser: 0.66 mg/dL (ref 0.44–1.00)
GFR, Estimated: >60 mL/min (ref >60)
Glucose, Bld: 136 mg/dL — ABNORMAL HIGH (ref 70–99)
Potassium: 3.6 mmol/L (ref 3.5–5.1)
Sodium: 138 mmol/L (ref 135–145)
Total Bilirubin: 0.6 mg/dL (ref 0.3–1.2)
Total Protein: 6.3 g/dL — ABNORMAL LOW (ref 6.5–8.1)

## 2022-06-26 LAB — MAGNESIUM: Magnesium: 2 mg/dL (ref 1.7–2.4)

## 2022-06-26 MED ORDER — SODIUM CHLORIDE 0.9 % IV SOLN
2400.0000 mg/m2 | INTRAVENOUS | Status: DC
  Administered 2022-06-26: 4150 mg via INTRAVENOUS
  Filled 2022-06-26: qty 83

## 2022-06-26 MED ORDER — SODIUM CHLORIDE 0.9% FLUSH: 10.0000 mL | INTRAVENOUS | Status: DC | PRN

## 2022-06-26 MED ORDER — ATROPINE SULFATE 1 MG/ML IV SOLN
0.5000 mg | Freq: Once | INTRAVENOUS | Status: AC | PRN
  Administered 2022-06-26: 0.5 mg via INTRAVENOUS
  Filled 2022-06-26: qty 1

## 2022-06-26 MED ORDER — SODIUM CHLORIDE 0.9 % IV SOLN
150.0000 mg | Freq: Once | INTRAVENOUS | Status: AC
  Administered 2022-06-26: 150 mg via INTRAVENOUS
  Filled 2022-06-26: qty 5

## 2022-06-26 MED ORDER — DEXTROSE 5 % IV SOLN: Freq: Once | INTRAVENOUS | Status: AC

## 2022-06-26 MED ORDER — LEUCOVORIN CALCIUM INJECTION 350 MG
400.0000 mg/m2 | Freq: Once | INTRAVENOUS | Status: AC
  Administered 2022-06-26: 688 mg via INTRAVENOUS
  Filled 2022-06-26: qty 34.4

## 2022-06-26 MED ORDER — HEPARIN SOD (PORK) LOCK FLUSH 100 UNIT/ML IV SOLN: 500.0000 [IU] | Freq: Once | INTRAVENOUS | Status: DC | PRN

## 2022-06-26 MED ORDER — SODIUM CHLORIDE 0.9 % IV SOLN
10.0000 mg | Freq: Once | INTRAVENOUS | Status: AC
  Administered 2022-06-26: 10 mg via INTRAVENOUS
  Filled 2022-06-26: qty 10

## 2022-06-26 MED ORDER — PALONOSETRON HCL INJECTION 0.25 MG/5ML
0.2500 mg | Freq: Once | INTRAVENOUS | Status: AC
  Administered 2022-06-26: 0.25 mg via INTRAVENOUS
  Filled 2022-06-26: qty 5

## 2022-06-26 MED ORDER — OXALIPLATIN CHEMO INJECTION 100 MG/20ML
85.0000 mg/m2 | Freq: Once | INTRAVENOUS | Status: AC
  Administered 2022-06-26: 145 mg via INTRAVENOUS
  Filled 2022-06-26: qty 20

## 2022-06-26 MED ORDER — SODIUM CHLORIDE 0.9 % IV SOLN
5.0000 mg/kg | Freq: Once | INTRAVENOUS | Status: AC
  Administered 2022-06-26: 300 mg via INTRAVENOUS
  Filled 2022-06-26: qty 12

## 2022-06-26 MED ORDER — HYDROCODONE-ACETAMINOPHEN 5-325 MG PO TABS: 1.0000 | ORAL_TABLET | Freq: Three times a day (TID) | ORAL | 0 refills | Status: DC | PRN

## 2022-06-26 MED ORDER — SODIUM CHLORIDE 0.9 % IV SOLN
165.0000 mg/m2 | Freq: Once | INTRAVENOUS | Status: AC
  Administered 2022-06-26: 280 mg via INTRAVENOUS
  Filled 2022-06-26: qty 10

## 2022-06-26 MED ORDER — SODIUM CHLORIDE 0.9 % IV SOLN: Freq: Once | INTRAVENOUS | Status: AC

## 2022-06-26 NOTE — Progress Notes (Signed)
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, a 40 y.o. female, returns for routine follow-up and consideration for next cycle of chemotherapy. Terri Mckinney was last seen on 06/12/2022.  Due for cycle #3 of FOLFOXIRI + Bevacizumab today.   Overall, she tells me she has been feeling pretty well. She reports swelling in her feet bilaterally for 2 days following her last treatment which resolved with an epsom salt bath and elevating her feet. Her appetite is good, and she is drinking 1 [employer] Protein or Fair Life protein drink along with 5 small meals daily. She reports difficulty sleeping. She has lost 3 lbs since her last visit. She reports her rectal pain is worsened following eating sweets, but overall her rectal pain has improved. She has not taken hydrocodone in 1 week. She is taking ibuprofen prn, and she is taking Gabapentin once at night. She reports 1 episode of soft stool. She is emptying her colostomy bag twice daily. She denies hematuria, nosebleeds, and hematochezia. She denies abdominal cramping. She reports cold sensitivity lasting 10 days after treatment. She denies numbness and tingling in her hands and feet. She reports  fatigue for 1 day following treatment, and then her energy returns to her baseline. She reports 4 dark red spots on her legs bilaterally, 2 on each leg.     Overall, she feels ready for next cycle of chemo today.    REVIEW OF SYSTEMS:  Review of Systems  Constitutional:  Positive for unexpected weight change (-3 lbs). Negative for appetite change and fatigue.  HENT:   Negative for nosebleeds.   Cardiovascular:  Negative for leg swelling (feet - resolved).  Gastrointestinal:  Positive for diarrhea (x1) and rectal pain. Negative for abdominal pain and blood in stool.  Genitourinary:  Negative for hematuria.   Neurological:  Negative for numbness.  Psychiatric/Behavioral:  Positive for sleep disturbance.   All other systems reviewed and are negative.   PAST MEDICAL/SURGICAL HISTORY:  Past Medical History:  Diagnosis Date   Cancer [facility])    Depression, major, single episode, mild ([place])    Port-A-Cath in place 05/24/2022   Past Surgical History:  Procedure Laterality Date   BIOPSY  03/21/2022   Procedure: BIOPSY;  Surgeon: [name], DO;  Location[facility];  Service: Endoscopy;;   COLONOSCOPY WITH PROPOFOL N/A 03/21/2022   Procedure: COLONOSCOPY WITH PROPOFOL;  Surgeon: [name], DO;  Location: [facility];  Service: Endoscopy;  Laterality: N/A;  8:30am   FLEXIBLE SIGMOIDOSCOPY N/A 05/19/2022   Procedure: FLEXIBLE SIGMOIDOSCOPY WITH TATTOO INJECTION;  Surgeon: Leighton Ruff, MD;  Location: [facility] ORS;  Service: General;  Laterality: N/A;   LAPAROSCOPIC DIVERTED COLOSTOMY N/A 05/19/2022  Procedure: LAPAROSCOPIC DIVERTED OSTOMY;  Surgeon: Leighton Ruff, MD;  Location: [facility] ORS;  Service: General;  Laterality: N/A;   PORTACATH PLACEMENT Right 05/19/2022   Procedure: INSERTION PORT-A-CATH;  Surgeon: Leighton Ruff, MD;  Location: [facility] ORS;  Service: General;  Laterality: Right;    SOCIAL HISTORY:  Social History   Socioeconomic History   Marital status: Married     Spouse name: Not on file   Number of children: Not on file   Years of education: Not on file   Highest education level: Not on file  Occupational History   Not on file  Tobacco Use   Smoking status: Every Day    Packs/day: 2.00    Years: 27.00    Total pack years: 54.00    Types: Cigarettes   Smokeless tobacco: Never  Vaping Use   Vaping Use: Never used  Substance and Sexual Activity   Alcohol use: No   Drug use: No   Sexual activity: Yes    Birth control/protection: Injection  Other Topics Concern   Not on file  Social History Narrative   Not on file   Social Determinants of Health   Financial Resource Strain: Not on file  Food Insecurity: Not on file  Transportation Needs: Not on file  Physical Activity: Not on file  Stress: Not on file  Social Connections: Not on file  Intimate Partner Violence: Not on file    FAMILY HISTORY:  Family History  Problem Relation Age of Onset   Heart disease Mother    Diabetes Mother    Hearing loss Father    Heart disease Father    Hyperlipidemia Father    Hypertension Father    Stroke Father    Arthritis Father    Diabetes Father    Learning disabilities Brother    Diabetes Maternal Grandmother    Heart disease Maternal Grandmother    Diabetes Maternal Grandfather    Diabetes Paternal Grandmother    Diabetes Paternal Grandfather     CURRENT MEDICATIONS:  Current Outpatient Medications  Medication Sig Dispense Refill   Bevacizumab (AVASTIN IV) Inject into the vein every 14 (fourteen) days.     cetirizine (ZYRTEC) 10 MG tablet Take 10 mg by mouth daily.     famotidine (PEPCID) 20 MG tablet Take 1 tablet (20 mg total) by mouth 2 (two) times daily for 14 days. 28 tablet 0   fluorouracil CALGB 00762 2,400 mg/m2 in sodium chloride 0.9 % 150 mL Inject 2,400 mg/m2 into the vein over 48 hr.     FLUOROURACIL IV Inject into the vein every 14 (fourteen) days.     gabapentin (NEURONTIN) 300 MG capsule Take 1 capsule (300 mg total)  by mouth 3 (three) times daily. 30 capsule 3   HYDROcodone-acetaminophen (NORCO) 5-325 MG tablet Take 1 tablet by mouth every 8 (eight) hours as needed for moderate pain. 90 tablet 0   ibuprofen (ADVIL) 200 MG tablet Take 1,000 mg by mouth every 6 (six) hours as needed for moderate pain.     IRINOTECAN HCL IV Inject into the vein every 14 (fourteen) days.     LEUCOVORIN CALCIUM IV Inject into the vein every 14 (fourteen) days.     lidocaine-prilocaine (EMLA) cream Apply small amount to port a cath site and cover with plastic wrap 1 hour prior to infusion appointments (Patient not taking: Reported on 06/12/2022) 30 g 3   medroxyPROGESTERone (DEPO-PROVERA) 150 MG/ML injection INJECT 1 ML (150 MG TOTAL) INTO THE MUSCLE EVERY 3 (  THREE) MONTHS 1 mL 2   Multiple Vitamin (MULTIVITAMIN) tablet Take 1 tablet by mouth daily.     OXALIPLATIN IV Inject into the vein every 14 (fourteen) days.     predniSONE (DELTASONE) 20 MG tablet 2 po at sametime daily for 5 days- start tomorrow 10 tablet 0   prochlorperazine (COMPAZINE) 10 MG tablet Take 1 tablet (10 mg total) by mouth every 6 (six) hours as needed (Nausea or vomiting). (Patient not taking: Reported on 06/12/2022) 30 tablet 1   No current facility-administered medications for this visit.    ALLERGIES:  Allergies  Allergen Reactions   Wellbutrin [Bupropion]     insomnia   Tramadol Rash    PHYSICAL EXAM:  Performance status (ECOG): 0 - Asymptomatic  There were no vitals filed for this visit. Wt Readings from Last 3 Encounters:  06/12/22 134 lb 4 oz (60.9 kg)  05/29/22 138 lb 12.8 oz (63 kg)  05/24/22 130 lb 3.2 oz (59.1 kg)   Physical Exam Vitals reviewed.  Constitutional:      Appearance: Normal appearance.  Cardiovascular:     Rate and Rhythm: Normal rate and regular rhythm.     Pulses: Normal pulses.     Heart sounds: Normal heart sounds.  Pulmonary:     Effort: Pulmonary effort is normal.     Breath sounds: Normal breath sounds.   Neurological:     General: No focal deficit present.     Mental Status: She is alert and oriented to person, place, and time.  Psychiatric:        Mood and Affect: Mood normal.        Behavior: Behavior normal.     LABORATORY DATA:  I have reviewed the labs as listed.     Latest Ref Rng & Units 06/12/2022    8:28 AM 05/29/2022    7:53 AM 05/21/2022    4:23 AM  CBC  WBC 4.0 - 10.5 K/uL 6.8  17.0  11.2   Hemoglobin 12.0 - 15.0 g/dL 11.2  11.1  10.2   Hematocrit 36.0 - 46.0 % 33.8  33.1  31.1   Platelets 150 - 400 K/uL 292  412  270       Latest Ref Rng & Units 06/12/2022    8:28 AM 05/29/2022    7:53 AM 05/21/2022    4:23 AM  CMP  Glucose 70 - 99 mg/dL 90  112  87   BUN 6 - 20 mg/dL '13  16  9   ' Creatinine 0.44 - 1.00 mg/dL 0.54  0.65  0.64   Sodium 135 - 145 mmol/L 137  137  140   Potassium 3.5 - 5.1 mmol/L 3.9  4.0  4.0   Chloride 98 - 111 mmol/L 109  105  110   CO2 22 - 32 mmol/L '23  24  24   ' Calcium 8.9 - 10.3 mg/dL 8.6  9.1  8.4   Total Protein 6.5 - 8.1 g/dL 6.0  6.4    Total Bilirubin 0.3 - 1.2 mg/dL 0.4  0.4    Alkaline Phos 38 - 126 U/L 48  37    AST 15 - 41 U/L 16  12    ALT 0 - 44 U/L 30  49      DIAGNOSTIC IMAGING:  I have independently reviewed the scans and discussed with the patient. No results found.   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  Stools have become bloody/mucousy lately. - She also reported pain in the tailbone region since March 2023.  She also has right-sided lower back pain. - 50 pound weight loss in the last 9 months, part of weight loss was intentional.  She cut back on eating sweets and lost taste to sweets after COVID infection. - CT [facility] with contrast on 03/08/2022: Irregular circumferential masslike wall thickening of sigmoid colon/rectum with adjacent perirectal adenopathy.  Multiple small hypodense lesions in the liver, largest 2.9 cm in the  central aspect of the liver, question metastatic disease. - Colonoscopy on 03/21/2022 by Dr. Abbey Chatters: Fungating infiltrative nearly completely obstructing mass in the rectosigmoid colon, mass was circumferential measuring 4 cm in length. - Pathology: Rectal mass biopsy consistent with invasive moderately differentiated adenocarcinoma.  As there is very scant invasive tumor, MSI studies were deferred. - PET scan (03/23/2022): Hypermetabolic rectal primary long-segment with SUV 14.3.  Left posterior perirectal lymph node 7 mm with SUV 2.9.  Multiple tiny foci of hepatic hypermetabolism. - MRI of the liver (04/01/2022): Multiple small hypovascular rim-enhancing liver lesions, predominantly in the right hepatic lobe measuring up to 1.1 cm.  3.3 cm hypervascular mass in the central liver, most consistent with FNH/hepatic adenoma. - Liver biopsy (04/20/2022): Metastatic moderately differentiated colonic adenocarcinoma with mucinous features - NGS testing shows K-ras G12 R mutation, PIK3CA exon 21 mutation.  MS-stable.  TMB-low. - Cycle 1 of FOLFOXIRI on 05/29/2022, bevacizumab added during cycle 2    Social/family history: - She is separated and is seen today with her daughter.  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  She is current active smoker, 1 pack/day for 27 years.  Denies drinking alcohol. - Paternal aunt had colon cancer.  Maternal cousin has breast cancer.  Maternal uncle had leukemia and maternal cousin had leukemia.   PLAN:  Metastatic rectal cancer to the liver: - She reported swelling of her feet 3 days after treatment which improved the next day. - She had cold sensitivity lasting for 10 days.  Denies any tingling or numbness next of days. - She lost about 3 pounds in the last 2 weeks.  Drinking 1 protein supplement. - She also reported mucus in the perianal area, brownish in the last 2 weeks.  She normally empties the colostomy bag twice per day.  No change in consistency except for 1 day. -  Reviewed labs today which shows normal LFTs.  CBC was grossly normal.  UA was negative for protein. - Proceed with cycle 3 of FOLFOXIRI and bevacizumab.  RTC 2 weeks for follow-up.  2.  Sacral/right lower back pain: - She ran out of hydrocodone about 5 days ago. - She is taking gabapentin 300 mg at bedtime and ibuprofen as needed. - We will send a refill for Hydrocodone.   Orders placed this encounter:  No orders of the defined types were placed in this encounter.    Derek Jack, MD [place] [phone]   I, Thana Ates, am acting as a scribe for Dr. Derek Jack.  I, Derek Jack MD, have reviewed the above documentation for accuracy and completeness, and I agree with the above.

## 2022-06-26 NOTE — Patient Instructions (Signed)
[facility]  Discharge Instructions: Thank you for choosing [facility] to provide your oncology and hematology care.  If you have a lab appointment with the [facility], please come in thru the Main Entrance and check in at the main information desk.  Wear comfortable clothing and clothing appropriate for easy access to any Portacath or PICC line.   We strive to give you quality time with your provider. You may need to reschedule your appointment if you arrive late (15 or more minutes).  Arriving late affects you and other patients whose appointments are after yours.  Also, if you miss three or more appointments without notifying the office, you may be dismissed from the clinic at the provider's discretion.      For prescription refill requests, have your pharmacy contact our office and allow 72 hours for refills to be completed.    Today you received the following chemotherapy and/or immunotherapy agents Zirabev FOLFOXRIR with pump connection      To help prevent nausea and vomiting after your treatment, we encourage you to take your nausea medication as directed.  BELOW ARE SYMPTOMS THAT SHOULD BE REPORTED IMMEDIATELY: *FEVER GREATER THAN 100.4 F (38 C) OR HIGHER *CHILLS OR SWEATING *NAUSEA AND VOMITING THAT IS NOT CONTROLLED WITH YOUR NAUSEA MEDICATION *UNUSUAL SHORTNESS OF BREATH *UNUSUAL BRUISING OR BLEEDING *URINARY PROBLEMS (pain or burning when urinating, or frequent urination) *BOWEL PROBLEMS (unusual diarrhea, constipation, pain near the anus) TENDERNESS IN MOUTH AND THROAT WITH OR WITHOUT PRESENCE OF ULCERS (sore throat, sores in mouth, or a toothache) UNUSUAL RASH, SWELLING OR PAIN  UNUSUAL VAGINAL DISCHARGE OR ITCHING   Items with * indicate a potential emergency and should be followed up as soon as possible or go to the Emergency Department if any problems should occur.  Please show the CHEMOTHERAPY ALERT CARD or IMMUNOTHERAPY ALERT CARD at  check-in to the Emergency Department and triage nurse.  Should you have questions after your visit or need to cancel or reschedule your appointment, please contact [facility] [phone]  and follow the prompts.  Office hours are 8:00 a.m. to 4:30 p.m. Monday - Friday. Please note that voicemails left after 4:00 p.m. may not be returned until the following business day.  We are closed weekends and major holidays. You have access to a nurse at all times for urgent questions. Please call the main number to the clinic [phone] and follow the prompts.  For any non-urgent questions, you may also contact your provider using MyChart. We now offer e-Visits for anyone 69 and older to request care online for non-urgent symptoms. For details visit [email or web address].   Also download the MyChart app! Go to the app store, search "MyChart", open the app, select [facility], and log in with your MyChart username and password.  Masks are optional in the cancer centers. If you would like for your care team to wear a mask while they are taking care of you, please let them know. For doctor visits, patients may have with them one support person who is at least 43 years old. At this time, visitors are not allowed in the infusion area.

## 2022-06-26 NOTE — Patient Instructions (Addendum)
[facility] at [facility] Discharge Instructions   You were seen and examined today by Dr. Katragadda.  He reviewed your lab work which is normal/stable.   We will proceed with your treatment today.  Return as scheduled.    Thank you for choosing [facility] at [facility] to provide your oncology and hematology care.  To afford each patient quality time with our provider, please arrive at least 15 minutes before your scheduled appointment time.   If you have a lab appointment with the Cancer Center please come in thru the Main Entrance and check in at the main information desk.  You need to re-schedule your appointment should you arrive 10 or more minutes late.  We strive to give you quality time with our providers, and arriving late affects you and other patients whose appointments are after yours.  Also, if you no show three or more times for appointments you may be dismissed from the clinic at the providers discretion.     Again, thank you for choosing [facility].  Our hope is that these requests will decrease the amount of time that you wait before being seen by our physicians.       _____________________________________________________________  Should you have questions after your visit to [facility], please contact our office at [phone] and follow the prompts.  Our office hours are 8:00 a.m. and 4:30 p.m. Monday - Friday.  Please note that voicemails left after 4:00 p.m. may not be returned until the following business day.  We are closed weekends and major holidays.  You do have access to a nurse 24-7, just call the main number to the clinic [phone] and do not press any options, hold on the line and a nurse will answer the phone.    For prescription refill requests, have your pharmacy contact our office and allow 72 hours.    Due to Covid, you will need to wear a mask upon entering the hospital. If  you do not have a mask, a mask will be given to you at the Main Entrance upon arrival. For doctor visits, patients may have 1 support person age 18 or older with them. For treatment visits, patients can not have anyone with them due to social distancing guidelines and our immunocompromised population.      

## 2022-06-26 NOTE — Progress Notes (Signed)
Patient presents today for Zirabev/FOLFOXRIR with pump connection per providers order.  Vital signs within parameters for treatment.  Labs pending.  Labs within parameters for treatment.  Message received from Anastasio Champion RN/Dr. Delton Coombes patient okay for treatment.  Zirabev/FOLFOXRIR with pump connection given today per MD orders. Stable during infusion without adverse affects.  Vital signs stable.  No complaints at this time.  Discharge from clinic ambulatory in stable condition.  Alert and oriented X 3.  Follow up with [facility] as scheduled.

## 2022-06-27 LAB — CEA: CEA: 7.7 ng/mL — ABNORMAL HIGH (ref 0.0–4.7)

## 2022-06-28 ENCOUNTER — Inpatient Hospital Stay (HOSPITAL_COMMUNITY): Payer: BC Managed Care – PPO

## 2022-06-28 VITALS — BP 107/59 | HR 81 | Temp 97.9°F | Resp 18

## 2022-06-28 DIAGNOSIS — Z5111 Encounter for antineoplastic chemotherapy: Secondary | ICD-10-CM | POA: Diagnosis not present

## 2022-06-28 DIAGNOSIS — Z95828 Presence of other vascular implants and grafts: Secondary | ICD-10-CM

## 2022-06-28 DIAGNOSIS — C20 Malignant neoplasm of rectum: Secondary | ICD-10-CM

## 2022-06-28 MED ORDER — HEPARIN SOD (PORK) LOCK FLUSH 100 UNIT/ML IV SOLN
500.0000 [IU] | Freq: Once | INTRAVENOUS | Status: AC | PRN
  Administered 2022-06-28: 500 [IU]

## 2022-06-28 MED ORDER — SODIUM CHLORIDE 0.9% FLUSH
10.0000 mL | INTRAVENOUS | Status: DC | PRN
  Administered 2022-06-28: 10 mL

## 2022-06-28 NOTE — Progress Notes (Signed)
Patient for chemotherapy pump disconnect with no complaints voiced.  Patients port flushed without difficulty.  Good blood return noted with no bruising or swelling noted at site.  Band aid applied.  VSS with discharge and left ambulatory with no s/s of distress noted.   

## 2022-06-28 NOTE — Patient Instructions (Signed)
[facility]  Discharge Instructions: Thank you for choosing [facility] to provide your oncology and hematology care.  If you have a lab appointment with the Cancer Center, please come in thru the Main Entrance and check in at the main information desk.  Wear comfortable clothing and clothing appropriate for easy access to any Portacath or PICC line.   We strive to give you quality time with your provider. You may need to reschedule your appointment if you arrive late (15 or more minutes).  Arriving late affects you and other patients whose appointments are after yours.  Also, if you miss three or more appointments without notifying the office, you may be dismissed from the clinic at the provider's discretion.      For prescription refill requests, have your pharmacy contact our office and allow 72 hours for refills to be completed.         To help prevent nausea and vomiting after your treatment, we encourage you to take your nausea medication as directed.  BELOW ARE SYMPTOMS THAT SHOULD BE REPORTED IMMEDIATELY: *FEVER GREATER THAN 100.4 F (38 C) OR HIGHER *CHILLS OR SWEATING *NAUSEA AND VOMITING THAT IS NOT CONTROLLED WITH YOUR NAUSEA MEDICATION *UNUSUAL SHORTNESS OF BREATH *UNUSUAL BRUISING OR BLEEDING *URINARY PROBLEMS (pain or burning when urinating, or frequent urination) *BOWEL PROBLEMS (unusual diarrhea, constipation, pain near the anus) TENDERNESS IN MOUTH AND THROAT WITH OR WITHOUT PRESENCE OF ULCERS (sore throat, sores in mouth, or a toothache) UNUSUAL RASH, SWELLING OR PAIN  UNUSUAL VAGINAL DISCHARGE OR ITCHING   Items with * indicate a potential emergency and should be followed up as soon as possible or go to the Emergency Department if any problems should occur.  Please show the CHEMOTHERAPY ALERT CARD or IMMUNOTHERAPY ALERT CARD at check-in to the Emergency Department and triage nurse.  Should you have questions after your visit or need to  cancel or reschedule your appointment, please contact [facility] [phone]  and follow the prompts.  Office hours are 8:00 a.m. to 4:30 p.m. Monday - Friday. Please note that voicemails left after 4:00 p.m. may not be returned until the following business day.  We are closed weekends and major holidays. You have access to a nurse at all times for urgent questions. Please call the main number to the clinic [phone] and follow the prompts.  For any non-urgent questions, you may also contact your provider using MyChart. We now offer e-Visits for anyone 18 and older to request care online for non-urgent symptoms. For details visit [email or web address].   Also download the MyChart app! Go to the app store, search "MyChart", open the app, select [facility], and log in with your MyChart username and password.  Masks are optional in the cancer centers. If you would like for your care team to wear a mask while they are taking care of you, please let them know. For doctor visits, patients may have with them one support person who is at least 43 years old. At this time, visitors are not allowed in the infusion area.  

## 2022-07-03 ENCOUNTER — Other Ambulatory Visit: Payer: Self-pay | Admitting: Family

## 2022-07-03 DIAGNOSIS — Z3042 Encounter for surveillance of injectable contraceptive: Secondary | ICD-10-CM

## 2022-07-04 ENCOUNTER — Other Ambulatory Visit: Payer: Self-pay

## 2022-07-04 ENCOUNTER — Encounter: Payer: Self-pay | Admitting: *Deleted

## 2022-07-04 NOTE — Progress Notes (Signed)
PA - Approval received from plan for hydrocodone 5/'235mg'$  for 30 day supply at $0 co pay from 07/03/22-07/03/23.  Patient is aware.

## 2022-07-05 ENCOUNTER — Ambulatory Visit (INDEPENDENT_AMBULATORY_CARE_PROVIDER_SITE_OTHER): Payer: BC Managed Care – PPO | Admitting: *Deleted

## 2022-07-05 DIAGNOSIS — Z3042 Encounter for surveillance of injectable contraceptive: Secondary | ICD-10-CM

## 2022-07-05 MED ORDER — MEDROXYPROGESTERONE ACETATE 150 MG/ML IM SUSP
150.0000 mg | Freq: Once | INTRAMUSCULAR | Status: AC
  Administered 2022-07-05: 150 mg via INTRAMUSCULAR

## 2022-07-06 ENCOUNTER — Other Ambulatory Visit: Payer: Self-pay

## 2022-07-10 ENCOUNTER — Inpatient Hospital Stay: Payer: BC Managed Care – PPO | Admitting: Hematology

## 2022-07-10 ENCOUNTER — Inpatient Hospital Stay: Payer: BC Managed Care – PPO | Attending: Hematology

## 2022-07-10 ENCOUNTER — Other Ambulatory Visit: Payer: Self-pay

## 2022-07-10 ENCOUNTER — Inpatient Hospital Stay: Payer: BC Managed Care – PPO

## 2022-07-10 VITALS — BP 102/63 | HR 67 | Temp 98.3°F | Resp 18

## 2022-07-10 DIAGNOSIS — C20 Malignant neoplasm of rectum: Secondary | ICD-10-CM

## 2022-07-10 DIAGNOSIS — Z5189 Encounter for other specified aftercare: Secondary | ICD-10-CM

## 2022-07-10 DIAGNOSIS — Z800 Family history of malignant neoplasm of digestive organs: Secondary | ICD-10-CM | POA: Insufficient documentation

## 2022-07-10 DIAGNOSIS — C787 Secondary malignant neoplasm of liver and intrahepatic bile duct: Secondary | ICD-10-CM | POA: Insufficient documentation

## 2022-07-10 DIAGNOSIS — Z5111 Encounter for antineoplastic chemotherapy: Secondary | ICD-10-CM | POA: Diagnosis present

## 2022-07-10 DIAGNOSIS — G629 Polyneuropathy, unspecified: Secondary | ICD-10-CM | POA: Insufficient documentation

## 2022-07-10 DIAGNOSIS — T451X5A Adverse effect of antineoplastic and immunosuppressive drugs, initial encounter: Secondary | ICD-10-CM | POA: Insufficient documentation

## 2022-07-10 DIAGNOSIS — G620 Drug-induced polyneuropathy: Secondary | ICD-10-CM | POA: Diagnosis not present

## 2022-07-10 DIAGNOSIS — Z95828 Presence of other vascular implants and grafts: Secondary | ICD-10-CM

## 2022-07-10 DIAGNOSIS — K769 Liver disease, unspecified: Secondary | ICD-10-CM

## 2022-07-10 DIAGNOSIS — F17210 Nicotine dependence, cigarettes, uncomplicated: Secondary | ICD-10-CM | POA: Diagnosis not present

## 2022-07-10 LAB — COMPREHENSIVE METABOLIC PANEL
ALT: 32 U/L (ref 0–44)
AST: 18 U/L (ref 15–41)
Albumin: 3.6 g/dL (ref 3.5–5.0)
Alkaline Phosphatase: 57 U/L (ref 38–126)
Anion gap: 7 (ref 5–15)
BUN: 17 mg/dL (ref 6–20)
CO2: 21 mmol/L — ABNORMAL LOW (ref 22–32)
Calcium: 9.4 mg/dL (ref 8.9–10.3)
Chloride: 109 mmol/L (ref 98–111)
Creatinine, Ser: 0.65 mg/dL (ref 0.44–1.00)
GFR, Estimated: >60 mL/min (ref >60)
Glucose, Bld: 128 mg/dL — ABNORMAL HIGH (ref 70–99)
Potassium: 4.1 mmol/L (ref 3.5–5.1)
Sodium: 137 mmol/L (ref 135–145)
Total Bilirubin: 0.5 mg/dL (ref 0.3–1.2)
Total Protein: 6.6 g/dL (ref 6.5–8.1)

## 2022-07-10 LAB — CBC WITH DIFFERENTIAL/PLATELET
Abs Immature Granulocytes: 0.02 10*3/uL (ref 0.00–0.07)
Basophils Absolute: 0.1 10*3/uL (ref 0.0–0.1)
Basophils Relative: 1 %
Eosinophils Absolute: 0.3 10*3/uL (ref 0.0–0.5)
Eosinophils Relative: 5 %
HCT: 36.4 % (ref 36.0–46.0)
Hemoglobin: 12.1 g/dL (ref 12.0–15.0)
Immature Granulocytes: 0 %
Lymphocytes Relative: 37 %
Lymphs Abs: 2.2 10*3/uL (ref 0.7–4.0)
MCH: 31.8 pg (ref 26.0–34.0)
MCHC: 33.2 g/dL (ref 30.0–36.0)
MCV: 95.8 fL (ref 80.0–100.0)
Monocytes Absolute: 0.4 10*3/uL (ref 0.1–1.0)
Monocytes Relative: 6 %
Neutro Abs: 3.1 10*3/uL (ref 1.7–7.7)
Neutrophils Relative %: 51 %
Platelets: 207 10*3/uL (ref 150–400)
RBC: 3.8 MIL/uL — ABNORMAL LOW (ref 3.87–5.11)
RDW: 14.3 % (ref 11.5–15.5)
WBC: 6.1 10*3/uL (ref 4.0–10.5)
nRBC: 0 % (ref 0.0–0.2)

## 2022-07-10 LAB — PREGNANCY, URINE: Preg Test, Ur: NEGATIVE

## 2022-07-10 LAB — MAGNESIUM: Magnesium: 2 mg/dL (ref 1.7–2.4)

## 2022-07-10 MED ORDER — HEPARIN SOD (PORK) LOCK FLUSH 100 UNIT/ML IV SOLN: 500.0000 [IU] | Freq: Once | INTRAVENOUS | Status: DC | PRN

## 2022-07-10 MED ORDER — SODIUM CHLORIDE 0.9 % IV SOLN: INTRAVENOUS | Status: DC

## 2022-07-10 MED ORDER — ATROPINE SULFATE 1 MG/ML IV SOLN
0.5000 mg | Freq: Once | INTRAVENOUS | Status: AC
  Administered 2022-07-10: 0.5 mg via INTRAVENOUS
  Filled 2022-07-10: qty 1

## 2022-07-10 MED ORDER — SODIUM CHLORIDE 0.9 % IV SOLN
165.0000 mg/m2 | Freq: Once | INTRAVENOUS | Status: AC
  Administered 2022-07-10: 280 mg via INTRAVENOUS
  Filled 2022-07-10: qty 14

## 2022-07-10 MED ORDER — DEXTROSE 5 % IV SOLN: Freq: Once | INTRAVENOUS | Status: AC

## 2022-07-10 MED ORDER — SODIUM CHLORIDE 0.9 % IV SOLN
10.0000 mg | Freq: Once | INTRAVENOUS | Status: AC
  Administered 2022-07-10: 10 mg via INTRAVENOUS
  Filled 2022-07-10: qty 10

## 2022-07-10 MED ORDER — LEUCOVORIN CALCIUM INJECTION 350 MG
400.0000 mg/m2 | Freq: Once | INTRAVENOUS | Status: AC
  Administered 2022-07-10: 688 mg via INTRAVENOUS
  Filled 2022-07-10: qty 34.4

## 2022-07-10 MED ORDER — OXALIPLATIN CHEMO INJECTION 100 MG/20ML
75.0000 mg/m2 | Freq: Once | INTRAVENOUS | Status: AC
  Administered 2022-07-10: 130 mg via INTRAVENOUS
  Filled 2022-07-10: qty 20

## 2022-07-10 MED ORDER — PALONOSETRON HCL INJECTION 0.25 MG/5ML
0.2500 mg | Freq: Once | INTRAVENOUS | Status: AC
  Administered 2022-07-10: 0.25 mg via INTRAVENOUS
  Filled 2022-07-10: qty 5

## 2022-07-10 MED ORDER — SODIUM CHLORIDE 0.9 % IV SOLN
2400.0000 mg/m2 | INTRAVENOUS | Status: DC
  Administered 2022-07-10: 4150 mg via INTRAVENOUS
  Filled 2022-07-10: qty 83

## 2022-07-10 MED ORDER — SODIUM CHLORIDE 0.9 % IV SOLN: Freq: Once | INTRAVENOUS | Status: AC

## 2022-07-10 MED ORDER — SODIUM CHLORIDE 0.9 % IV SOLN
5.0000 mg/kg | Freq: Once | INTRAVENOUS | Status: AC
  Administered 2022-07-10: 300 mg via INTRAVENOUS
  Filled 2022-07-10: qty 12

## 2022-07-10 MED ORDER — SODIUM CHLORIDE 0.9% FLUSH: 10.0000 mL | INTRAVENOUS | Status: DC | PRN

## 2022-07-10 MED ORDER — SODIUM CHLORIDE 0.9 % IV SOLN
150.0000 mg | Freq: Once | INTRAVENOUS | Status: AC
  Administered 2022-07-10: 150 mg via INTRAVENOUS
  Filled 2022-07-10: qty 150

## 2022-07-10 NOTE — Progress Notes (Signed)
Patient presents today for Zirabev, Irinotecan, Oxaliplaitn, Leucovorin, and 5FU pump start per providers order.  Message received from Anastasio Champion RN/Dr. Delton Coombes patient okay for treatment.  Treatment given today per MD orders.  Stable during infusion without adverse affects.  5FU pump start and verified RUN on the screen with the patient.  Vital signs stable.  No complaints at this time.  Discharge from clinic ambulatory in stable condition.  Alert and oriented X 3.  Follow up with [facility] as scheduled.

## 2022-07-10 NOTE — Patient Instructions (Addendum)
[place] at [facility] Discharge Instructions   You were seen and examined today by Dr. Delton Coombes.  He reviewed part of your blood work (CBC) which is normal. You electrolytes, kidney function, and liver function results are pending. We will proceed with your treatment today pending those results.   Continue drinking Fairlife protein drinks.      Thank you for choosing [place] at [facility] to provide your oncology and hematology care.  To afford each patient quality time with our provider, please arrive at least 15 minutes before your scheduled appointment time.   If you have a lab appointment with the [facility] please come in thru the Main Entrance and check in at the main information desk.  You need to re-schedule your appointment should you arrive 10 or more minutes late.  We strive to give you quality time with our providers, and arriving late affects you and other patients whose appointments are after yours.  Also, if you no show three or more times for appointments you may be dismissed from the clinic at the providers discretion.     Again, thank you for choosing [facility].  Our hope is that these requests will decrease the amount of time that you wait before being seen by our physicians.       _____________________________________________________________  Should you have questions after your visit to [facility], please contact our office at [phone] and follow the prompts.  Our office hours are 8:00 a.m. and 4:30 p.m. Monday - Friday.  Please note that voicemails left after 4:00 p.m. may not be returned until the following business day.  We are closed weekends and major holidays.  You do have access to a nurse 24-7, just call the main number to the clinic [phone] and do not press any options, hold on the line and a nurse will answer the phone.    For prescription refill requests, have your  pharmacy contact our office and allow 72 hours.    Due to Covid, you will need to wear a mask upon entering the hospital. If you do not have a mask, a mask will be given to you at the Main Entrance upon arrival. For doctor visits, patients may have 1 support person age 60 or older with them. For treatment visits, patients can not have anyone with them due to social distancing guidelines and our immunocompromised population.

## 2022-07-10 NOTE — Progress Notes (Signed)
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, a 41 y.o. female, comes back today for next cycle of chemotherapy.  Appetite is 100%.  Energy levels are 75%.  Numbness and tingling in the toes which is constant for the last 2 weeks.  Cold sensitivity lasted about 8 days.  She has been eating cheese cakes and has gained 6 pounds since last visit.  Overall, she feels ready for next cycle of chemo today.    REVIEW OF SYSTEMS:  Review of Systems  Constitutional:  Negative for appetite change, fatigue and unexpected weight change.  HENT:   Negative for nosebleeds.   Cardiovascular:  Negative for leg swelling.  Gastrointestinal:  Negative for abdominal pain, blood in stool and diarrhea.  Genitourinary:  Negative for hematuria.   Neurological:  Positive for numbness.  Psychiatric/Behavioral:  Negative for sleep disturbance.   All other systems reviewed and are negative.   PAST MEDICAL/SURGICAL HISTORY:  Past Medical History:  Diagnosis Date   Cancer [facility])    Depression, major, single episode, mild ([place])    Port-A-Cath in place 05/24/2022   Past Surgical History:  Procedure Laterality Date   BIOPSY  03/21/2022   Procedure:  BIOPSY;  Surgeon: [name], DO;  Location: [facility];  Service: Endoscopy;;   COLONOSCOPY WITH PROPOFOL N/A 03/21/2022   Procedure: COLONOSCOPY WITH PROPOFOL;  Surgeon: [name], DO;  Location: [facility] END[facility] SU[facility];  Service: Endoscopy;  Laterality: N/A;  8:30am   FLEXIBLE SIGMOIDOSCOPY N/A 05/19/2022   Procedure: FLEXIBLE SIGMOIDOSCOPY WITH TATTOO INJECTION;  Surgeon: Leighton Ruff, MD;  Location: [facility] ORS;  Service: General;  Laterality: N/A;   LAPAROSCOPIC DIVERTED COLOSTOMY N/A 05/19/2022   Procedure: LAPAROSCOPIC DIVERTED OSTOMY;  Surgeon: Leighton Ruff, MD;  Location: [facility] ORS;  Service: General;  Laterality: N/A;   PORTACATH PLACEMENT Right 05/19/2022   Procedure: INSERTION PORT-A-CATH;  Surgeon: Leighton Ruff, MD;  Location: [facility] ORS;  Service: General;  Laterality: Right;    SOCIAL HISTORY:  Social History   Socioeconomic History   Marital status: Married    Spouse name: Not on file   Number of children: Not on file   Years of education: Not on file   Highest education level: Not on file  Occupational History   Not on file  Tobacco Use   Smoking status: Every Day    Packs/day: 2.00    Years: 27.00    Total pack years: 54.00    Types: Cigarettes   Smokeless tobacco: Never  Vaping Use   Vaping Use: Never used  Substance and Sexual Activity   Alcohol use: No   Drug use: No   Sexual activity: Yes    Birth control/protection:  Injection  Other Topics Concern   Not on file  Social History Narrative   Not on file   Social Determinants of Health   Financial Resource Strain: Not on file  Food Insecurity: Not on file  Transportation Needs: Not on file  Physical Activity: Not on file  Stress: Not on file  Social Connections: Not on file  Intimate Partner Violence: Not on file    FAMILY HISTORY:  Family History  Problem Relation Age of Onset   Heart disease Mother    Diabetes Mother    Hearing loss Father    Heart disease Father    Hyperlipidemia Father     Hypertension Father    Stroke Father    Arthritis Father    Diabetes Father    Learning disabilities Brother    Diabetes Maternal Grandmother    Heart disease Maternal Grandmother    Diabetes Maternal Grandfather    Diabetes Paternal Grandmother    Diabetes Paternal Grandfather     CURRENT MEDICATIONS:  Current Outpatient Medications  Medication Sig Dispense Refill   Bevacizumab (AVASTIN IV) Inject into the vein every 14 (fourteen) days.     Calcium-Cholecalciferol-Zinc 650-20-5.5 MG-MCG-MG CHEW Chew by mouth.     cetirizine (ZYRTEC) 10 MG tablet Take 10 mg by mouth daily.     famotidine (PEPCID) 20 MG tablet Take 1 tablet (20 mg total) by mouth 2 (two) times daily for 14 days. 28 tablet 0   fluorouracil CALGB 32440 2,400 mg/m2 in sodium chloride 0.9 % 150 mL Inject 2,400 mg/m2 into the vein over 48 hr.     FLUOROURACIL IV Inject into the vein every 14 (fourteen) days.     gabapentin (NEURONTIN) 300 MG capsule Take 1 capsule (300 mg total) by mouth 3 (three) times daily. 30 capsule 3   HYDROcodone-acetaminophen (NORCO) 5-325 MG tablet Take 1 tablet by mouth every 8 (eight) hours as needed for moderate pain. 90 tablet 0   ibuprofen (ADVIL) 200 MG tablet Take 1,000 mg by mouth every 6 (six) hours as needed for moderate pain.     IRINOTECAN HCL IV Inject into the vein every 14 (fourteen) days.     LEUCOVORIN CALCIUM IV Inject into the vein every 14 (fourteen) days.     lidocaine-prilocaine (EMLA) cream Apply small amount to port a cath site and cover with plastic wrap 1 hour prior to infusion appointments 30 g 3   medroxyPROGESTERone (DEPO-PROVERA) 150 MG/ML injection INJECT 1 ML (150 MG TOTAL) INTO THE MUSCLE EVERY 3 (THREE) MONTHS 1 mL 0   Multiple Vitamin (MULTIVITAMIN) tablet Take 1 tablet by mouth daily.     OXALIPLATIN IV Inject into the vein every 14 (fourteen) days.     predniSONE (DELTASONE) 20 MG tablet 2 po at sametime daily for 5 days- start tomorrow 10 tablet 0    prochlorperazine (COMPAZINE) 10 MG tablet Take 1 tablet (10 mg total) by mouth every 6 (six) hours as needed (Nausea or vomiting). 30 tablet 1   No current facility-administered medications for this visit.    ALLERGIES:  Allergies  Allergen Reactions   Wellbutrin [Bupropion]     insomnia   Tramadol Rash    PHYSICAL EXAM:  Performance status (ECOG): 0 - Asymptomatic  There were no vitals filed for this visit. Wt Readings from Last 3 Encounters:  07/10/22 137 lb 5.6 oz (62.3 kg)  06/26/22 131 lb 6.4 oz (59.6 kg)  06/12/22 134 lb 4 oz (60.9 kg)   Physical Exam Vitals  reviewed.  Constitutional:      Appearance: Normal appearance.  Cardiovascular:     Rate and Rhythm: Normal rate and regular rhythm.     Pulses: Normal pulses.     Heart sounds: Normal heart sounds.  Pulmonary:     Effort: Pulmonary effort is normal.     Breath sounds: Normal breath sounds.  Neurological:     General: No focal deficit present.     Mental Status: She is alert and oriented to person, place, and time.  Psychiatric:        Mood and Affect: Mood normal.        Behavior: Behavior normal.    LABORATORY DATA:  I have reviewed the labs as listed.     Latest Ref Rng & Units 06/26/2022    8:24 AM 06/12/2022    8:28 AM 05/29/2022    7:53 AM  CBC  WBC 4.0 - 10.5 K/uL 6.6  6.8  17.0   Hemoglobin 12.0 - 15.0 g/dL 11.9  11.2  11.1   Hematocrit 36.0 - 46.0 % 35.9  33.8  33.1   Platelets 150 - 400 K/uL 219  292  412       Latest Ref Rng & Units 06/26/2022    8:24 AM 06/12/2022    8:28 AM 05/29/2022    7:53 AM  CMP  Glucose 70 - 99 mg/dL 136  90  112   BUN 6 - 20 mg/dL _0 Creatinine 0.44 - 1.00 mg/dL 0.66  0.54  0.65   Sodium 135 - 145 mmol/L 138  137  137   Potassium 3.5 - 5.1 mmol/L 3.6  3.9  4.0   Chloride 98 - 111 mmol/L 108  109  105   CO2 22 - 32 mmol/L _1 Calcium 8.9 - 10.3 mg/dL 9.1  8.6  9.1   Total Protein 6.5 - 8.1 g/dL 6.3  6.0  6.4   Total Bilirubin 0.3 - 1.2  mg/dL 0.6  0.4  0.4   Alkaline Phos 38 - 126 U/L 59  48  37   AST 15 - 41 U/L _2 ALT 0 - 44 U/L 24  30  49     DIAGNOSTIC IMAGING:  I have independently reviewed the scans and discussed with the patient. No results found.   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  Stools have become bloody/mucousy lately. - She also reported pain in the tailbone region since March 2023.  She also has right-sided lower back pain. - 50 pound weight loss in the last 9 months, part of weight loss was intentional.  She cut back on eating sweets and lost taste to sweets after COVID infection. - CT [facility] with contrast on 03/08/2022: Irregular circumferential masslike wall thickening of sigmoid colon/rectum with adjacent perirectal adenopathy.  Multiple small hypodense lesions in the liver, largest 2.9 cm in the central aspect of the liver, question metastatic disease. - Colonoscopy on 03/21/2022 by Dr. Abbey Chatters: Fungating infiltrative nearly completely obstructing mass in the rectosigmoid colon, mass was circumferential measuring 4 cm in length. - Pathology: Rectal mass biopsy consistent with invasive moderately differentiated adenocarcinoma.  As there is very scant invasive tumor, MSI studies were deferred. - PET scan (03/23/2022): Hypermetabolic rectal primary long-segment with SUV 14.3.  Left posterior perirectal lymph node 7  mm with SUV 2.9.  Multiple tiny foci of hepatic hypermetabolism. - MRI of the liver (04/01/2022): Multiple small hypovascular rim-enhancing liver lesions, predominantly in the right hepatic lobe measuring up to 1.1 cm.  3.3 cm hypervascular mass in the central liver, most consistent with FNH/hepatic adenoma. - Liver biopsy (04/20/2022): Metastatic moderately differentiated colonic adenocarcinoma with mucinous features - NGS testing shows K-ras G12 R mutation, PIK3CA exon 21 mutation.  MS-stable.   TMB-low. - Cycle 1 of FOLFOXIRI on 05/29/2022, bevacizumab added during cycle 2    Social/family history: - She is separated and is seen today with her daughter.  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  She is current active smoker, 1 pack/day for 27 years.  Denies drinking alcohol. - Paternal aunt had colon cancer.  Maternal cousin has breast cancer.  Maternal uncle had leukemia and maternal cousin had leukemia.   PLAN:  Metastatic rectal cancer to the liver: - She has tolerated third cycle of chemotherapy with bevacizumab reasonably well. - Cold sensitivity lasted 8 days.  She had nausea couple of times on the first day after treatment with no vomiting.  No diarrhea or cramping noted. - Reviewed labs today which showed normal LFTs.  CBC was grossly normal.  Last CEA has improved to 7.7. - Proceed with cycle 4 today with dose reduction of oxaliplatin.  RTC 2 weeks for follow-up.  2.  Sacral/right lower back pain: - Continue 2 tablets of hydrocodone and gabapentin 300 mg at bedtime. - She takes ibuprofen 1000 mg during daytime as needed.  Pain is fairly well controlled.  3.  Peripheral neuropathy: - She reports tingling in the toes which is constant in the last 2 weeks after cycle 3.  Cold sensitivity is lasting 8 days. - We will decrease oxaliplatin to 75 mg per metered square.   Orders placed this encounter:  No orders of the defined types were placed in this encounter.    Derek Jack, MD [place] [phone]   I, Thana Ates, am acting as a scribe for Dr. Derek Jack.  I, Derek Jack MD, have reviewed the above documentation for accuracy and completeness, and I agree with the above.

## 2022-07-10 NOTE — Patient Instructions (Signed)
[facility]  Discharge Instructions: Thank you for choosing [place] [facility] to provide your oncology and hematology care.  If you have a lab appointment with the [facility], please come in thru the Main Entrance and check in at the main information desk.  Wear comfortable clothing and clothing appropriate for easy access to any Portacath or PICC line.   We strive to give you quality time with your provider. You may need to reschedule your appointment if you arrive late (15 or more minutes).  Arriving late affects you and other patients whose appointments are after yours.  Also, if you miss three or more appointments without notifying the office, you may be dismissed from the clinic at the provider's discretion.      For prescription refill requests, have your pharmacy contact our office and allow 72 hours for refills to be completed.    Today you received the following chemotherapy and/or immunotherapy agents folfoxiri zirabev      To help prevent nausea and vomiting after your treatment, we encourage you to take your nausea medication as directed.  BELOW ARE SYMPTOMS THAT SHOULD BE REPORTED IMMEDIATELY: *FEVER GREATER THAN 100.4 F (38 C) OR HIGHER *CHILLS OR SWEATING *NAUSEA AND VOMITING THAT IS NOT CONTROLLED WITH YOUR NAUSEA MEDICATION *UNUSUAL SHORTNESS OF BREATH *UNUSUAL BRUISING OR BLEEDING *URINARY PROBLEMS (pain or burning when urinating, or frequent urination) *BOWEL PROBLEMS (unusual diarrhea, constipation, pain near the anus) TENDERNESS IN MOUTH AND THROAT WITH OR WITHOUT PRESENCE OF ULCERS (sore throat, sores in mouth, or a toothache) UNUSUAL RASH, SWELLING OR PAIN  UNUSUAL VAGINAL DISCHARGE OR ITCHING   Items with * indicate a potential emergency and should be followed up as soon as possible or go to the Emergency Department if any problems should occur.  Please show the CHEMOTHERAPY ALERT CARD or IMMUNOTHERAPY ALERT CARD at check-in to the  Emergency Department and triage nurse.  Should you have questions after your visit or need to cancel or reschedule your appointment, please contact [facility] [phone]  and follow the prompts.  Office hours are 8:00 a.m. to 4:30 p.m. Monday - Friday. Please note that voicemails left after 4:00 p.m. may not be returned until the following business day.  We are closed weekends and major holidays. You have access to a nurse at all times for urgent questions. Please call the main number to the clinic [phone] and follow the prompts.  For any non-urgent questions, you may also contact your provider using MyChart. We now offer e-Visits for anyone 4 and older to request care online for non-urgent symptoms. For details visit [email or web address].   Also download the MyChart app! Go to the app store, search "MyChart", open the app, select [facility], and log in with your MyChart username and password.  Masks are optional in the cancer centers. If you would like for your care team to wear a mask while they are taking care of you, please let them know. For doctor visits, patients may have with them one support person who is at least 43 years old. At this time, visitors are not allowed in the infusion area.

## 2022-07-12 ENCOUNTER — Inpatient Hospital Stay: Payer: BC Managed Care – PPO

## 2022-07-12 VITALS — BP 115/67 | HR 92 | Temp 98.3°F | Resp 18

## 2022-07-12 DIAGNOSIS — Z5111 Encounter for antineoplastic chemotherapy: Secondary | ICD-10-CM | POA: Diagnosis not present

## 2022-07-12 DIAGNOSIS — C20 Malignant neoplasm of rectum: Secondary | ICD-10-CM

## 2022-07-12 DIAGNOSIS — Z95828 Presence of other vascular implants and grafts: Secondary | ICD-10-CM

## 2022-07-12 MED ORDER — HEPARIN SOD (PORK) LOCK FLUSH 100 UNIT/ML IV SOLN
500.0000 [IU] | Freq: Once | INTRAVENOUS | Status: AC | PRN
  Administered 2022-07-12: 500 [IU]

## 2022-07-12 MED ORDER — SODIUM CHLORIDE 0.9% FLUSH
10.0000 mL | INTRAVENOUS | Status: DC | PRN
  Administered 2022-07-12: 10 mL

## 2022-07-12 NOTE — Progress Notes (Signed)
Patient arrived today for pump d/c per treatment orders.  Patient states that she has tolerated this treatment well except some mouth watering.  She didn't feel nauseous but just that "feeling you get where your mouth waters before".  I told her that she can take some nausea medication when that happens to keep from getting sick and she verbalizes understanding. She also reports some night sweats this time.  She denies any other issues.  She had 5.1 remaining on pump and that volume was primed to 0. Her port was flushed with saline and heparin per orders and she was deaccessed.  She verbalized the need to sign a new ROI to allow [place] to give information to her job about her FMLA.  That was taken care of before she left. She remained stable during pump d/c and was discharged ambulatory and in stable condition from clinic.  She is aware of her upcoming appointments.

## 2022-07-12 NOTE — Patient Instructions (Signed)
Please follow up as scheduled in 14 days.

## 2022-07-24 ENCOUNTER — Encounter: Payer: Self-pay | Admitting: *Deleted

## 2022-07-24 ENCOUNTER — Inpatient Hospital Stay: Payer: BC Managed Care – PPO

## 2022-07-24 ENCOUNTER — Inpatient Hospital Stay (HOSPITAL_COMMUNITY): Payer: BC Managed Care – PPO | Attending: Hematology | Admitting: Hematology

## 2022-07-24 VITALS — BP 107/62 | HR 65 | Temp 98.5°F | Resp 18

## 2022-07-24 DIAGNOSIS — C20 Malignant neoplasm of rectum: Secondary | ICD-10-CM | POA: Insufficient documentation

## 2022-07-24 DIAGNOSIS — K769 Liver disease, unspecified: Secondary | ICD-10-CM

## 2022-07-24 DIAGNOSIS — Z95828 Presence of other vascular implants and grafts: Secondary | ICD-10-CM

## 2022-07-24 DIAGNOSIS — Z5111 Encounter for antineoplastic chemotherapy: Secondary | ICD-10-CM | POA: Insufficient documentation

## 2022-07-24 DIAGNOSIS — C787 Secondary malignant neoplasm of liver and intrahepatic bile duct: Secondary | ICD-10-CM | POA: Insufficient documentation

## 2022-07-24 LAB — CBC WITH DIFFERENTIAL/PLATELET
Abs Immature Granulocytes: 0 10*3/uL (ref 0.00–0.07)
Basophils Absolute: 0 10*3/uL (ref 0.0–0.1)
Basophils Relative: 1 %
Eosinophils Absolute: 0.2 10*3/uL (ref 0.0–0.5)
Eosinophils Relative: 5 %
HCT: 36.3 % (ref 36.0–46.0)
Hemoglobin: 12.2 g/dL (ref 12.0–15.0)
Immature Granulocytes: 0 %
Lymphocytes Relative: 41 %
Lymphs Abs: 2 10*3/uL (ref 0.7–4.0)
MCH: 32.3 pg (ref 26.0–34.0)
MCHC: 33.6 g/dL (ref 30.0–36.0)
MCV: 96 fL (ref 80.0–100.0)
Monocytes Absolute: 0.4 10*3/uL (ref 0.1–1.0)
Monocytes Relative: 9 %
Neutro Abs: 2.1 10*3/uL (ref 1.7–7.7)
Neutrophils Relative %: 44 %
Platelets: 195 10*3/uL (ref 150–400)
RBC: 3.78 MIL/uL — ABNORMAL LOW (ref 3.87–5.11)
RDW: 14.8 % (ref 11.5–15.5)
WBC: 4.8 10*3/uL (ref 4.0–10.5)
nRBC: 0 % (ref 0.0–0.2)

## 2022-07-24 LAB — COMPREHENSIVE METABOLIC PANEL
ALT: 21 U/L (ref 0–44)
AST: 16 U/L (ref 15–41)
Albumin: 3.6 g/dL (ref 3.5–5.0)
Alkaline Phosphatase: 52 U/L (ref 38–126)
Anion gap: 6 (ref 5–15)
BUN: 14 mg/dL (ref 6–20)
CO2: 25 mmol/L (ref 22–32)
Calcium: 9 mg/dL (ref 8.9–10.3)
Chloride: 108 mmol/L (ref 98–111)
Creatinine, Ser: 0.51 mg/dL (ref 0.44–1.00)
GFR, Estimated: >60 mL/min (ref >60)
Glucose, Bld: 99 mg/dL (ref 70–99)
Potassium: 4.1 mmol/L (ref 3.5–5.1)
Sodium: 139 mmol/L (ref 135–145)
Total Bilirubin: 0.5 mg/dL (ref 0.3–1.2)
Total Protein: 6.4 g/dL — ABNORMAL LOW (ref 6.5–8.1)

## 2022-07-24 LAB — MAGNESIUM: Magnesium: 2.1 mg/dL (ref 1.7–2.4)

## 2022-07-24 MED ORDER — SODIUM CHLORIDE 0.9 % IV SOLN
2400.0000 mg/m2 | INTRAVENOUS | Status: DC
  Administered 2022-07-24: 4150 mg via INTRAVENOUS
  Filled 2022-07-24: qty 83

## 2022-07-24 MED ORDER — SODIUM CHLORIDE 0.9 % IV SOLN: Freq: Once | INTRAVENOUS | Status: AC

## 2022-07-24 MED ORDER — LEUCOVORIN CALCIUM INJECTION 350 MG
400.0000 mg/m2 | Freq: Once | INTRAVENOUS | Status: AC
  Administered 2022-07-24: 688 mg via INTRAVENOUS
  Filled 2022-07-24: qty 34.4

## 2022-07-24 MED ORDER — SODIUM CHLORIDE 0.9 % IV SOLN
5.0000 mg/kg | Freq: Once | INTRAVENOUS | Status: AC
  Administered 2022-07-24: 300 mg via INTRAVENOUS
  Filled 2022-07-24: qty 12

## 2022-07-24 MED ORDER — PALONOSETRON HCL INJECTION 0.25 MG/5ML
0.2500 mg | Freq: Once | INTRAVENOUS | Status: AC
  Administered 2022-07-24: 0.25 mg via INTRAVENOUS
  Filled 2022-07-24: qty 5

## 2022-07-24 MED ORDER — DEXTROSE 5 % IV SOLN: Freq: Once | INTRAVENOUS | Status: AC

## 2022-07-24 MED ORDER — SODIUM CHLORIDE 0.9 % IV SOLN
10.0000 mg | Freq: Once | INTRAVENOUS | Status: AC
  Administered 2022-07-24: 10 mg via INTRAVENOUS
  Filled 2022-07-24: qty 10

## 2022-07-24 MED ORDER — SODIUM CHLORIDE 0.9 % IV SOLN
150.0000 mg | Freq: Once | INTRAVENOUS | Status: AC
  Administered 2022-07-24: 150 mg via INTRAVENOUS
  Filled 2022-07-24: qty 150

## 2022-07-24 MED ORDER — OXALIPLATIN CHEMO INJECTION 100 MG/20ML
75.0000 mg/m2 | Freq: Once | INTRAVENOUS | Status: AC
  Administered 2022-07-24: 130 mg via INTRAVENOUS
  Filled 2022-07-24: qty 20

## 2022-07-24 MED ORDER — ATROPINE SULFATE 1 MG/ML IV SOLN
0.5000 mg | Freq: Once | INTRAVENOUS | Status: AC
  Administered 2022-07-24: 0.5 mg via INTRAVENOUS
  Filled 2022-07-24: qty 1

## 2022-07-24 MED ORDER — SODIUM CHLORIDE 0.9 % IV SOLN
165.0000 mg/m2 | Freq: Once | INTRAVENOUS | Status: AC
  Administered 2022-07-24: 280 mg via INTRAVENOUS
  Filled 2022-07-24: qty 10

## 2022-07-24 NOTE — Patient Instructions (Signed)
[place] at [facility] Discharge Instructions   You were seen and examined today by Dr. Delton Coombes.  He reviewed the results of your lab work which are normal.  We will repeat a CT scan after your next cycle of treatment.   We will proceed with your treatment today.  Return as scheduled.    Thank you for choosing [place] at [facility] to provide your oncology and hematology care.  To afford each patient quality time with our provider, please arrive at least 15 minutes before your scheduled appointment time.   If you have a lab appointment with the [facility] please come in thru the Main Entrance and check in at the main information desk.  You need to re-schedule your appointment should you arrive 10 or more minutes late.  We strive to give you quality time with our providers, and arriving late affects you and other patients whose appointments are after yours.  Also, if you no show three or more times for appointments you may be dismissed from the clinic at the providers discretion.     Again, thank you for choosing [facility].  Our hope is that these requests will decrease the amount of time that you wait before being seen by our physicians.       _____________________________________________________________  Should you have questions after your visit to [facility], please contact our office at [phone] and follow the prompts.  Our office hours are 8:00 a.m. and 4:30 p.m. Monday - Friday.  Please note that voicemails left after 4:00 p.m. may not be returned until the following business day.  We are closed weekends and major holidays.  You do have access to a nurse 24-7, just call the main number to the clinic [phone] and do not press any options, hold on the line and a nurse will answer the phone.    For prescription refill requests, have your pharmacy contact our office and allow 72 hours.     Due to Covid, you will need to wear a mask upon entering the hospital. If you do not have a mask, a mask will be given to you at the Main Entrance upon arrival. For doctor visits, patients may have 1 support person age 26 or older with them. For treatment visits, patients can not have anyone with them due to social distancing guidelines and our immunocompromised population.

## 2022-07-24 NOTE — Progress Notes (Signed)
Pt presents today for Zirabev and Folfoxiri per provider's order. Vital signs and labs WNL for treatment. Okay to proceed with treatment today per Dr.K.  Noah Charon and Folfoxiri given today per MD orders. Tolerated infusion without adverse affects. Vital signs stable. No complaints at this time. Discharged from clinic ambulatory in stable condition. Alert and oriented x 3. F/U with [facility] as scheduled. 5FU ambulatory pump infusing.

## 2022-07-24 NOTE — Progress Notes (Signed)
Patient has been examined by Dr. Katragadda, and vital signs and labs have been reviewed. ANC, Creatinine, LFTs, hemoglobin, and platelets are within treatment parameters per M.D. - pt may proceed with treatment.  Primary RN and pharmacy notified.  

## 2022-07-24 NOTE — Progress Notes (Unsigned)
Disability paperwork faxed to [place] @ [phone]

## 2022-07-24 NOTE — Patient Instructions (Signed)
[facility]  Discharge Instructions: Thank you for choosing [facility] to provide your oncology and hematology care.  If you have a lab appointment with the [facility], please come in thru the Main Entrance and check in at the main information desk.  Wear comfortable clothing and clothing appropriate for easy access to any Portacath or PICC line.   We strive to give you quality time with your provider. You may need to reschedule your appointment if you arrive late (15 or more minutes).  Arriving late affects you and other patients whose appointments are after yours.  Also, if you miss three or more appointments without notifying the office, you may be dismissed from the clinic at the provider's discretion.      For prescription refill requests, have your pharmacy contact our office and allow 72 hours for refills to be completed.    Today you received the following chemotherapy and/or immunotherapy agents Zirabev and Folfoxiri   To help prevent nausea and vomiting after your treatment, we encourage you to take your nausea medication as directed.   BELOW ARE SYMPTOMS THAT SHOULD BE REPORTED IMMEDIATELY: *FEVER GREATER THAN 100.4 F (38 C) OR HIGHER *CHILLS OR SWEATING *NAUSEA AND VOMITING THAT IS NOT CONTROLLED WITH YOUR NAUSEA MEDICATION *UNUSUAL SHORTNESS OF BREATH *UNUSUAL BRUISING OR BLEEDING *URINARY PROBLEMS (pain or burning when urinating, or frequent urination) *BOWEL PROBLEMS (unusual diarrhea, constipation, pain near the anus) TENDERNESS IN MOUTH AND THROAT WITH OR WITHOUT PRESENCE OF ULCERS (sore throat, sores in mouth, or a toothache) UNUSUAL RASH, SWELLING OR PAIN  UNUSUAL VAGINAL DISCHARGE OR ITCHING   Items with * indicate a potential emergency and should be followed up as soon as possible or go to the Emergency Department if any problems should occur.  Please show the CHEMOTHERAPY ALERT CARD or IMMUNOTHERAPY ALERT CARD at check-in to  the Emergency Department and triage nurse.  Should you have questions after your visit or need to cancel or reschedule your appointment, please contact [facility] [phone]  and follow the prompts.  Office hours are 8:00 a.m. to 4:30 p.m. Monday - Friday. Please note that voicemails left after 4:00 p.m. may not be returned until the following business day.  We are closed weekends and major holidays. You have access to a nurse at all times for urgent questions. Please call the main number to the clinic [phone] and follow the prompts.  For any non-urgent questions, you may also contact your provider using MyChart. We now offer e-Visits for anyone 80 and older to request care online for non-urgent symptoms. For details visit [email or web address].   Also download the MyChart app! Go to the app store, search "MyChart", open the app, select [facility], and log in with your MyChart username and password.  Masks are optional in the cancer centers. If you would like for your care team to wear a mask while they are taking care of you, please let them know. You may have one support person who is at least 43 years old accompany you for your appointments.  Bevacizumab Injection What is this medication? BEVACIZUMAB (be va SIZ yoo mab) treats some types of cancer. It works by blocking a protein that causes cancer cells to grow and multiply. This helps to slow or stop the spread of cancer cells. It is a monoclonal antibody. This medicine may be used for other purposes; ask your health care provider or pharmacist if you have questions. COMMON BRAND  NAME(S): Alymsys, Avastin, MVASI, Noah Charon What should I tell my care team before I take this medication? They need to know if you have any of these conditions: Blood clots Coughing up blood Having or recent surgery Heart failure High blood pressure History of a connection between 2 or more body parts that do not usually connect  (fistula) History of a tear in your stomach or intestines Protein in your urine An unusual or allergic reaction to bevacizumab, other medications, foods, dyes, or preservatives Pregnant or trying to get pregnant Breast-feeding How should I use this medication? This medication is injected into a vein. It is given by your care team in a hospital or clinic setting. Talk to your care team the use of this medication in children. Special care may be needed. Overdosage: If you think you have taken too much of this medicine contact a poison control center or emergency room at once. NOTE: This medicine is only for you. Do not share this medicine with others. What if I miss a dose? Keep appointments for follow-up doses. It is important not to miss your dose. Call your care team if you are unable to keep an appointment. What may interact with this medication? Interactions are not expected. This list may not describe all possible interactions. Give your health care provider a list of all the medicines, herbs, non-prescription drugs, or dietary supplements you use. Also tell them if you smoke, drink alcohol, or use illegal drugs. Some items may interact with your medicine. What should I watch for while using this medication? Your condition will be monitored carefully while you are receiving this medication. You may need blood work while taking this medication. This medication may make you feel generally unwell. This is not uncommon as chemotherapy can affect healthy cells as well as cancer cells. Report any side effects. Continue your course of treatment even though you feel ill unless your care team tells you to stop. This medication may increase your risk to bruise or bleed. Call your care team if you notice any unusual bleeding. Before having surgery, talk to your care team to make sure it is ok. This medication can increase the risk of poor healing of your surgical site or wound. You will need to stop  this medication for 28 days before surgery. After surgery, wait at least 28 days before restarting this medication. Make sure the surgical site or wound is healed enough before restarting this medication. Talk to your care team if questions. Talk to your care team if you may be pregnant. Serious birth defects can occur if you take this medication during pregnancy and for 6 months after the last dose. Contraception is recommended while taking this medication and for 6 months after the last dose. Your care team can help you find the option that works for you. Do not breastfeed while taking this medication and for 6 months after the last dose. This medication can cause infertility. Talk to your care team if you are concerned about your fertility. What side effects may I notice from receiving this medication? Side effects that you should report to your care team as soon as possible: Allergic reactions--skin rash, itching, hives, swelling of the face, lips, tongue, or throat Bleeding--bloody or black, tar-like stools, vomiting blood or brown material that looks like coffee grounds, red or dark brown urine, small red or purple spots on skin, unusual bruising or bleeding Blood clot--pain, swelling, or warmth in the leg, shortness of breath, chest pain Heart attack--pain  or tightness in the chest, shoulders, arms, or jaw, nausea, shortness of breath, cold or clammy skin, feeling faint or lightheaded Heart failure--shortness of breath, swelling of the ankles, feet, or hands, sudden weight gain, unusual weakness or fatigue Increase in blood pressure Infection--fever, chills, cough, sore throat, wounds that don't heal, pain or trouble when passing urine, general feeling of discomfort or being unwell Infusion reactions--chest pain, shortness of breath or trouble breathing, feeling faint or lightheaded Kidney injury--decrease in the amount of urine, swelling of the ankles, hands, or feet Stomach pain that is  severe, does not go away, or gets worse Stroke--sudden numbness or weakness of the face, arm, or leg, trouble speaking, confusion, trouble walking, loss of balance or coordination, dizziness, severe headache, change in vision Sudden and severe headache, confusion, change in vision, seizures, which may be signs of posterior reversible encephalopathy syndrome (PRES) Side effects that usually do not require medical attention (report to your care team if they continue or are bothersome): Back pain Change in taste Diarrhea Dry skin Increased tears Nosebleed This list may not describe all possible side effects. Call your doctor for medical advice about side effects. You may report side effects to FDA at 1-800-FDA-1088. Where should I keep my medication? This medication is given in a hospital or clinic. It will not be stored at home. NOTE: This sheet is a summary. It may not cover all possible information. If you have questions about this medicine, talk to your doctor, pharmacist, or health care provider.  2023 Elsevier/Gold Standard (2022-04-04 00:00:00)  Irinotecan Injection What is this medication? IRINOTECAN (ir in oh TEE kan) treats some types of cancer. It works by slowing down the growth of cancer cells. This medicine may be used for other purposes; ask your health care provider or pharmacist if you have questions. COMMON BRAND NAME(S): Camptosar What should I tell my care team before I take this medication? They need to know if you have any of these conditions: Dehydration Diarrhea Infection, especially a viral infection, such as chickenpox, cold sores, herpes Liver disease Low blood cell levels (white cells, red cells, and platelets) Low levels of electrolytes, such as calcium, magnesium, or potassium in your blood Recent or ongoing radiation An unusual or allergic reaction to irinotecan, other medications, foods, dyes, or preservatives If you or your partner are pregnant or trying  to get pregnant Breast-feeding How should I use this medication? This medication is injected into a vein. It is given by your care team in a hospital or clinic setting. Talk to your care team about the use of this medication in children. Special care may be needed. Overdosage: If you think you have taken too much of this medicine contact a poison control center or emergency room at once. NOTE: This medicine is only for you. Do not share this medicine with others. What if I miss a dose? Keep appointments for follow-up doses. It is important not to miss your dose. Call your care team if you are unable to keep an appointment. What may interact with this medication? Do not take this medication with any of the following: Cobicistat Itraconazole This medication may also interact with the following: Certain antibiotics, such as clarithromycin, rifampin, rifabutin Certain antivirals for HIV or AIDS Certain medications for fungal infections, such as ketoconazole, posaconazole, voriconazole Certain medications for seizures, such as carbamazepine, phenobarbital, phenytoin Gemfibrozil Nefazodone St. John's wort This list may not describe all possible interactions. Give your health care provider a list of all the  medicines, herbs, non-prescription drugs, or dietary supplements you use. Also tell them if you smoke, drink alcohol, or use illegal drugs. Some items may interact with your medicine. What should I watch for while using this medication? Your condition will be monitored carefully while you are receiving this medication. You may need blood work while taking this medication. This medication may make you feel generally unwell. This is not uncommon as chemotherapy can affect healthy cells as well as cancer cells. Report any side effects. Continue your course of treatment even though you feel ill unless your care team tells you to stop. This medication can cause serious side effects. To reduce the  risk, your care team may give you other medications to take before receiving this one. Be sure to follow the directions from your care team. This medication may affect your coordination, reaction time, or judgement. Do not drive or operate machinery until you know how this medication affects you. Sit up or stand slowly to reduce the risk of dizzy or fainting spells. Drinking alcohol with this medication can increase the risk of these side effects. This medication may increase your risk of getting an infection. Call your care team for advice if you get a fever, chills, sore throat, or other symptoms of a cold or flu. Do not treat yourself. Try to avoid being around people who are sick. Avoid taking medications that contain aspirin, acetaminophen, ibuprofen, naproxen, or ketoprofen unless instructed by your care team. These medications may hide a fever. This medication may increase your risk to bruise or bleed. Call your care team if you notice any unusual bleeding. Be careful brushing or flossing your teeth or using a toothpick because you may get an infection or bleed more easily. If you have any dental work done, tell your dentist you are receiving this medication. Talk to your care team if you or your partner are pregnant or think either of you might be pregnant. This medication can cause serious birth defects if taken during pregnancy and for 6 months after the last dose. You will need a negative pregnancy test before starting this medication. Contraception is recommended while taking this medication and for 6 months after the last dose. Your care team can help you find the option that works for you. Do not father a child while taking this medication and for 3 months after the last dose. Use a condom for contraception during this time period. Do not breastfeed while taking this medication and for 7 days after the last dose. This medication may cause infertility. Talk to your care team if you are  concerned about your fertility. What side effects may I notice from receiving this medication? Side effects that you should report to your care team as soon as possible: Allergic reactions--skin rash, itching, hives, swelling of the face, lips, tongue, or throat Dry cough, shortness of breath or trouble breathing Increased saliva or tears, increased sweating, stomach cramping, diarrhea, small pupils, unusual weakness or fatigue, slow heartbeat Infection--fever, chills, cough, sore throat, wounds that don't heal, pain or trouble when passing urine, general feeling of discomfort or being unwell Kidney injury--decrease in the amount of urine, swelling of the ankles, hands, or feet Low red blood cell level--unusual weakness or fatigue, dizziness, headache, trouble breathing Severe or prolonged diarrhea Unusual bruising or bleeding Side effects that usually do not require medical attention (report to your care team if they continue or are bothersome): Constipation Diarrhea Hair loss Loss of appetite Nausea Stomach pain This  list may not describe all possible side effects. Call your doctor for medical advice about side effects. You may report side effects to FDA at 1-800-FDA-1088. Where should I keep my medication? This medication is given in a hospital or clinic. It will not be stored at home. NOTE: This sheet is a summary. It may not cover all possible information. If you have questions about this medicine, talk to your doctor, pharmacist, or health care provider.  2023 Elsevier/Gold Standard (2022-03-30 00:00:00)  Oxaliplatin Injection What is this medication? OXALIPLATIN (ox AL i PLA tin) treats some types of cancer. It works by slowing down the growth of cancer cells. This medicine may be used for other purposes; ask your health care provider or pharmacist if you have questions. COMMON BRAND NAME(S): Eloxatin What should I tell my care team before I take this medication? They need to  know if you have any of these conditions: Heart disease History of irregular heartbeat or rhythm Liver disease Low blood cell levels (white cells, red cells, and platelets) Lung or breathing disease, such as asthma Take medications that treat or prevent blood clots Tingling of the fingers, toes, or other nerve disorder An unusual or allergic reaction to oxaliplatin, other medications, foods, dyes, or preservatives If you or your partner are pregnant or trying to get pregnant Breast-feeding How should I use this medication? This medication is injected into a vein. It is given by your care team in a hospital or clinic setting. Talk to your care team about the use of this medication in children. Special care may be needed. Overdosage: If you think you have taken too much of this medicine contact a poison control center or emergency room at once. NOTE: This medicine is only for you. Do not share this medicine with others. What if I miss a dose? Keep appointments for follow-up doses. It is important not to miss a dose. Call your care team if you are unable to keep an appointment. What may interact with this medication? Do not take this medication with any of the following: Cisapride Dronedarone Pimozide Thioridazine This medication may also interact with the following: Aspirin and aspirin-like medications Certain medications that treat or prevent blood clots, such as warfarin, apixaban, dabigatran, and rivaroxaban Cisplatin Cyclosporine Diuretics Medications for infection, such as acyclovir, adefovir, amphotericin B, bacitracin, cidofovir, foscarnet, ganciclovir, gentamicin, pentamidine, vancomycin NSAIDs, medications for pain and inflammation, such as ibuprofen or naproxen Other medications that cause heart rhythm changes Pamidronate Zoledronic acid This list may not describe all possible interactions. Give your health care provider a list of all the medicines, herbs, non-prescription  drugs, or dietary supplements you use. Also tell them if you smoke, drink alcohol, or use illegal drugs. Some items may interact with your medicine. What should I watch for while using this medication? Your condition will be monitored carefully while you are receiving this medication. You may need blood work while taking this medication. This medication may make you feel generally unwell. This is not uncommon as chemotherapy can affect healthy cells as well as cancer cells. Report any side effects. Continue your course of treatment even though you feel ill unless your care team tells you to stop. This medication may increase your risk of getting an infection. Call your care team for advice if you get a fever, chills, sore throat, or other symptoms of a cold or flu. Do not treat yourself. Try to avoid being around people who are sick. Avoid taking medications that contain aspirin, acetaminophen,  ibuprofen, naproxen, or ketoprofen unless instructed by your care team. These medications may hide a fever. Be careful brushing or flossing your teeth or using a toothpick because you may get an infection or bleed more easily. If you have any dental work done, tell your dentist you are receiving this medication. This medication can make you more sensitive to cold. Do not drink cold drinks or use ice. Cover exposed skin before coming in contact with cold temperatures or cold objects. When out in cold weather wear warm clothing and cover your mouth and nose to warm the air that goes into your lungs. Tell your care team if you get sensitive to the cold. Talk to your care team if you or your partner are pregnant or think either of you might be pregnant. This medication can cause serious birth defects if taken during pregnancy and for 9 months after the last dose. A negative pregnancy test is required before starting this medication. A reliable form of contraception is recommended while taking this medication and for 9  months after the last dose. Talk to your care team about effective forms of contraception. Do not father a child while taking this medication and for 6 months after the last dose. Use a condom while having sex during this time period. Do not breastfeed while taking this medication and for 3 months after the last dose. This medication may cause infertility. Talk to your care team if you are concerned about your fertility. What side effects may I notice from receiving this medication? Side effects that you should report to your care team as soon as possible: Allergic reactions--skin rash, itching, hives, swelling of the face, lips, tongue, or throat Bleeding--bloody or black, tar-like stools, vomiting blood or brown material that looks like coffee grounds, red or dark brown urine, small red or purple spots on skin, unusual bruising or bleeding Dry cough, shortness of breath or trouble breathing Heart rhythm changes--fast or irregular heartbeat, dizziness, feeling faint or lightheaded, chest pain, trouble breathing Infection--fever, chills, cough, sore throat, wounds that don't heal, pain or trouble when passing urine, general feeling of discomfort or being unwell Liver injury--right upper belly pain, loss of appetite, nausea, light-colored stool, dark yellow or brown urine, yellowing skin or eyes, unusual weakness or fatigue Low red blood cell level--unusual weakness or fatigue, dizziness, headache, trouble breathing Muscle injury--unusual weakness or fatigue, muscle pain, dark yellow or brown urine, decrease in amount of urine Pain, tingling, or numbness in the hands or feet Sudden and severe headache, confusion, change in vision, seizures, which may be signs of posterior reversible encephalopathy syndrome (PRES) Unusual bruising or bleeding Side effects that usually do not require medical attention (report to your care team if they continue or are bothersome): Diarrhea Nausea Pain, redness, or  swelling with sores inside the mouth or throat Unusual weakness or fatigue Vomiting This list may not describe all possible side effects. Call your doctor for medical advice about side effects. You may report side effects to FDA at 1-800-FDA-1088. Where should I keep my medication? This medication is given in a hospital or clinic. It will not be stored at home. NOTE: This sheet is a summary. It may not cover all possible information. If you have questions about this medicine, talk to your doctor, pharmacist, or health care provider.  2023 Elsevier/Gold Standard (2022-03-17 00:00:00) Leucovorin Injection What is this medication? LEUCOVORIN (loo koe VOR in) prevents side effects from certain medications, such as methotrexate. It works by increasing folate  levels. This helps protect healthy cells in your body. It may also be used to treat anemia caused by low levels of folate. It can also be used with fluorouracil, a type of chemotherapy, to treat colorectal cancer. It works by increasing the effects of fluorouracil in the body. This medicine may be used for other purposes; ask your health care provider or pharmacist if you have questions. What should I tell my care team before I take this medication? They need to know if you have any of these conditions: Anemia from low levels of vitamin B12 in the blood An unusual or allergic reaction to leucovorin, folic acid, other medications, foods, dyes, or preservatives Pregnant or trying to get pregnant Breastfeeding How should I use this medication? This medication is injected into a vein or a muscle. It is given by your care team in a hospital or clinic setting. Talk to your care team about the use of this medication in children. Special care may be needed. Overdosage: If you think you have taken too much of this medicine contact a poison control center or emergency room at once. NOTE: This medicine is only for you. Do not share this medicine with  others. What if I miss a dose? Keep appointments for follow-up doses. It is important not to miss your dose. Call your care team if you are unable to keep an appointment. What may interact with this medication? Capecitabine Fluorouracil Phenobarbital Phenytoin Primidone Trimethoprim;sulfamethoxazole This list may not describe all possible interactions. Give your health care provider a list of all the medicines, herbs, non-prescription drugs, or dietary supplements you use. Also tell them if you smoke, drink alcohol, or use illegal drugs. Some items may interact with your medicine. What should I watch for while using this medication? Your condition will be monitored carefully while you are receiving this medication. This medication may increase the side effects of 5-fluorouracil. Tell your care team if you have diarrhea or mouth sores that do not get better or that get worse. What side effects may I notice from receiving this medication? Side effects that you should report to your care team as soon as possible: Allergic reactions--skin rash, itching, hives, swelling of the face, lips, tongue, or throat This list may not describe all possible side effects. Call your doctor for medical advice about side effects. You may report side effects to FDA at 1-800-FDA-1088. Where should I keep my medication? This medication is given in a hospital or clinic. It will not be stored at home. NOTE: This sheet is a summary. It may not cover all possible information. If you have questions about this medicine, talk to your doctor, pharmacist, or health care provider.  2023 Elsevier/Gold Standard (2022-03-31 00:00:00) Fluorouracil Injection What is this medication? FLUOROURACIL (flure oh YOOR a sil) treats some types of cancer. It works by slowing down the growth of cancer cells. This medicine may be used for other purposes; ask your health care provider or pharmacist if you have questions. COMMON BRAND  NAME(S): Adrucil What should I tell my care team before I take this medication? They need to know if you have any of these conditions: Blood disorders Dihydropyrimidine dehydrogenase (DPD) deficiency Infection, such as chickenpox, cold sores, herpes Kidney disease Liver disease Poor nutrition Recent or ongoing radiation therapy An unusual or allergic reaction to fluorouracil, other medications, foods, dyes, or preservatives If you or your partner are pregnant or trying to get pregnant Breast-feeding How should I use this medication? This medication is  injected into a vein. It is administered by your care team in a hospital or clinic setting. Talk to your care team about the use of this medication in children. Special care may be needed. Overdosage: If you think you have taken too much of this medicine contact a poison control center or emergency room at once. NOTE: This medicine is only for you. Do not share this medicine with others. What if I miss a dose? Keep appointments for follow-up doses. It is important not to miss your dose. Call your care team if you are unable to keep an appointment. What may interact with this medication? Do not take this medication with any of the following: Live virus vaccines This medication may also interact with the following: Medications that treat or prevent blood clots, such as warfarin, enoxaparin, dalteparin This list may not describe all possible interactions. Give your health care provider a list of all the medicines, herbs, non-prescription drugs, or dietary supplements you use. Also tell them if you smoke, drink alcohol, or use illegal drugs. Some items may interact with your medicine. What should I watch for while using this medication? Your condition will be monitored carefully while you are receiving this medication. This medication may make you feel generally unwell. This is not uncommon as chemotherapy can affect healthy cells as well as  cancer cells. Report any side effects. Continue your course of treatment even though you feel ill unless your care team tells you to stop. In some cases, you may be given additional medications to help with side effects. Follow all directions for their use. This medication may increase your risk of getting an infection. Call your care team for advice if you get a fever, chills, sore throat, or other symptoms of a cold or flu. Do not treat yourself. Try to avoid being around people who are sick. This medication may increase your risk to bruise or bleed. Call your care team if you notice any unusual bleeding. Be careful brushing or flossing your teeth or using a toothpick because you may get an infection or bleed more easily. If you have any dental work done, tell your dentist you are receiving this medication. Avoid taking medications that contain aspirin, acetaminophen, ibuprofen, naproxen, or ketoprofen unless instructed by your care team. These medications may hide a fever. Do not treat diarrhea with over the counter products. Contact your care team if you have diarrhea that lasts more than 2 days or if it is severe and watery. This medication can make you more sensitive to the sun. Keep out of the sun. If you cannot avoid being in the sun, wear protective clothing and sunscreen. Do not use sun lamps, tanning beds, or tanning booths. Talk to your care team if you or your partner wish to become pregnant or think you might be pregnant. This medication can cause serious birth defects if taken during pregnancy and for 3 months after the last dose. A reliable form of contraception is recommended while taking this medication and for 3 months after the last dose. Talk to your care team about effective forms of contraception. Do not father a child while taking this medication and for 3 months after the last dose. Use a condom while having sex during this time period. Do not breastfeed while taking this  medication. This medication may cause infertility. Talk to your care team if you are concerned about your fertility. What side effects may I notice from receiving this medication? Side effects that you  should report to your care team as soon as possible: Allergic reactions--skin rash, itching, hives, swelling of the face, lips, tongue, or throat Heart attack--pain or tightness in the chest, shoulders, arms, or jaw, nausea, shortness of breath, cold or clammy skin, feeling faint or lightheaded Heart failure--shortness of breath, swelling of the ankles, feet, or hands, sudden weight gain, unusual weakness or fatigue Heart rhythm changes--fast or irregular heartbeat, dizziness, feeling faint or lightheaded, chest pain, trouble breathing High ammonia level--unusual weakness or fatigue, confusion, loss of appetite, nausea, vomiting, seizures Infection--fever, chills, cough, sore throat, wounds that don't heal, pain or trouble when passing urine, general feeling of discomfort or being unwell Low red blood cell level--unusual weakness or fatigue, dizziness, headache, trouble breathing Pain, tingling, or numbness in the hands or feet, muscle weakness, change in vision, confusion or trouble speaking, loss of balance or coordination, trouble walking, seizures Redness, swelling, and blistering of the skin over hands and feet Severe or prolonged diarrhea Unusual bruising or bleeding Side effects that usually do not require medical attention (report to your care team if they continue or are bothersome): Dry skin Headache Increased tears Nausea Pain, redness, or swelling with sores inside the mouth or throat Sensitivity to light Vomiting This list may not describe all possible side effects. Call your doctor for medical advice about side effects. You may report side effects to FDA at 1-800-FDA-1088. Where should I keep my medication? This medication is given in a hospital or clinic. It will not be stored  at home. NOTE: This sheet is a summary. It may not cover all possible information. If you have questions about this medicine, talk to your doctor, pharmacist, or health care provider.  2023 Elsevier/Gold Standard (2022-03-28 00:00:00)

## 2022-07-24 NOTE — Progress Notes (Signed)
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 female, comes back today for toxicity assessment and next cycle of chemotherapy.  She had cold sensitivity lasted about 7 days.  She denies any tingling or numbness in extremities.  Previous numbness in the fingertips has resolved.  She is taking pain medication every night and is remaining pain-free.  She was able to enjoy a fishing trip last week.   REVIEW OF SYSTEMS:  Review of Systems  Constitutional:  Negative for appetite change, fatigue and unexpected weight change.  HENT:   Negative for nosebleeds.   Cardiovascular:  Negative for leg swelling.  Gastrointestinal:  Negative for abdominal pain, blood in stool and diarrhea.  Genitourinary:  Negative for hematuria.   Neurological:  Negative for numbness.  Psychiatric/Behavioral:  Negative for sleep disturbance.   All other systems reviewed and are negative.   PAST MEDICAL/SURGICAL HISTORY:  Past Medical History:  Diagnosis Date   Cancer [facility])    Depression, major, single episode, mild ([place])    Port-A-Cath in place 05/24/2022   Past Surgical History:  Procedure Laterality Date   BIOPSY  03/21/2022   Procedure:  BIOPSY;  Surgeon: [name], DO;  Location: [facility] SU[facility];  Service: Endoscopy;;   COLONOSCOPY WITH PROPOFOL N/A 03/21/2022   Procedure: COLONOSCOPY WITH PROPOFOL;  Surgeon: [name], DO;  Location: [facility] ENDO [facility];  Service: Endoscopy;  Laterality: N/A;  8:30am   FLEXIBLE SIGMOIDOSCOPY N/A 05/19/2022   Procedure: FLEXIBLE SIGMOIDOSCOPY WITH TATTOO INJECTION;  Surgeon: Leighton Ruff, MD;  Location: [facility] ORS;  Service: General;  Laterality: N/A;   LAPAROSCOPIC DIVERTED COLOSTOMY N/A 05/19/2022   Procedure: LAPAROSCOPIC DIVERTED OSTOMY;  Surgeon: Leighton Ruff, MD;  Location: [facility] ORS;  Service: General;  Laterality: N/A;   PORTACATH PLACEMENT Right 05/19/2022   Procedure: INSERTION PORT-A-CATH;  Surgeon: Leighton Ruff, MD;  Location: [facility] ORS;  Service: General;  Laterality: Right;    SOCIAL HISTORY:  Social History   Socioeconomic History   Marital status: Married    Spouse name: Not on file   Number of children: Not on file   Years of education: Not on file   Highest education level: Not on file  Occupational History   Not on file  Tobacco Use   Smoking status: Every Day    Packs/day: 2.00    Years: 27.00    Total pack years: 54.00    Types: Cigarettes   Smokeless tobacco: Never  Vaping Use   Vaping Use: Never used  Substance and Sexual Activity   Alcohol use: No   Drug use: No   Sexual activity: Yes    Birth control/protection: Injection  Other Topics Concern  Not on file  Social History Narrative   Not on file   Social Determinants of Health   Financial Resource Strain: Not on file  Food Insecurity: Not on file  Transportation Needs: Not on file  Physical Activity: Not on file  Stress: Not on file  Social Connections: Not on file  Intimate Partner Violence: Not on file    FAMILY HISTORY:  Family History  Problem Relation Age of Onset   Heart disease Mother    Diabetes Mother    Hearing loss Father    Heart disease Father    Hyperlipidemia Father     Hypertension Father    Stroke Father    Arthritis Father    Diabetes Father    Learning disabilities Brother    Diabetes Maternal Grandmother    Heart disease Maternal Grandmother    Diabetes Maternal Grandfather    Diabetes Paternal Grandmother    Diabetes Paternal Grandfather     CURRENT MEDICATIONS:  Current Outpatient Medications  Medication Sig Dispense Refill   Bevacizumab (AVASTIN IV) Inject into the vein every 14 (fourteen) days.     Calcium-Cholecalciferol-Zinc 650-20-5.5 MG-MCG-MG CHEW Chew by mouth.     cetirizine (ZYRTEC) 10 MG tablet Take 10 mg by mouth daily.     famotidine (PEPCID) 20 MG tablet Take 1 tablet (20 mg total) by mouth 2 (two) times daily for 14 days. 28 tablet 0   fluorouracil CALGB 26712 2,400 mg/m2 in sodium chloride 0.9 % 150 mL Inject 2,400 mg/m2 into the vein over 48 hr.     FLUOROURACIL IV Inject into the vein every 14 (fourteen) days.     gabapentin (NEURONTIN) 300 MG capsule Take 1 capsule (300 mg total) by mouth 3 (three) times daily. 30 capsule 3   HYDROcodone-acetaminophen (NORCO) 5-325 MG tablet Take 1 tablet by mouth every 8 (eight) hours as needed for moderate pain. 90 tablet 0   ibuprofen (ADVIL) 200 MG tablet Take 1,000 mg by mouth every 6 (six) hours as needed for moderate pain.     IRINOTECAN HCL IV Inject into the vein every 14 (fourteen) days.     LEUCOVORIN CALCIUM IV Inject into the vein every 14 (fourteen) days.     lidocaine-prilocaine (EMLA) cream Apply small amount to port a cath site and cover with plastic wrap 1 hour prior to infusion appointments 30 g 3   medroxyPROGESTERone (DEPO-PROVERA) 150 MG/ML injection INJECT 1 ML (150 MG TOTAL) INTO THE MUSCLE EVERY 3 (THREE) MONTHS 1 mL 0   Multiple Vitamin (MULTIVITAMIN) tablet Take 1 tablet by mouth daily.     OXALIPLATIN IV Inject into the vein every 14 (fourteen) days.     predniSONE (DELTASONE) 20 MG tablet 2 po at sametime daily for 5 days- start tomorrow 10 tablet 0    prochlorperazine (COMPAZINE) 10 MG tablet Take 1 tablet (10 mg total) by mouth every 6 (six) hours as needed (Nausea or vomiting). 30 tablet 1   No current facility-administered medications for this visit.    ALLERGIES:  Allergies  Allergen Reactions   Wellbutrin [Bupropion]     insomnia   Tramadol Rash    PHYSICAL EXAM:  Performance status (ECOG): 0 - Asymptomatic  There were no vitals filed for this visit. Wt Readings from Last 3 Encounters:  07/10/22 137 lb 5.6 oz (62.3 kg)  06/26/22 131 lb 6.4 oz (59.6 kg)  06/12/22 134 lb 4 oz (60.9 kg)   Physical Exam Vitals reviewed.  Constitutional:  Appearance: Normal appearance.  Cardiovascular:     Rate and Rhythm: Normal rate and regular rhythm.     Pulses: Normal pulses.     Heart sounds: Normal heart sounds.  Pulmonary:     Effort: Pulmonary effort is normal.     Breath sounds: Normal breath sounds.  Neurological:     General: No focal deficit present.     Mental Status: She is alert and oriented to person, place, and time.  Psychiatric:        Mood and Affect: Mood normal.        Behavior: Behavior normal.    LABORATORY DATA:  I have reviewed the labs as listed.     Latest Ref Rng & Units 07/10/2022    9:44 AM 06/26/2022    8:24 AM 06/12/2022    8:28 AM  CBC  WBC 4.0 - 10.5 K/uL 6.1  6.6  6.8   Hemoglobin 12.0 - 15.0 g/dL 12.1  11.9  11.2   Hematocrit 36.0 - 46.0 % 36.4  35.9  33.8   Platelets 150 - 400 K/uL 207  219  292       Latest Ref Rng & Units 07/10/2022    9:44 AM 06/26/2022    8:24 AM 06/12/2022    8:28 AM  CMP  Glucose 70 - 99 mg/dL 128  136  90   BUN 6 - 20 mg/dL _0 Creatinine 0.44 - 1.00 mg/dL 0.65  0.66  0.54   Sodium 135 - 145 mmol/L 137  138  137   Potassium 3.5 - 5.1 mmol/L 4.1  3.6  3.9   Chloride 98 - 111 mmol/L 109  108  109   CO2 22 - 32 mmol/L _1 Calcium 8.9 - 10.3 mg/dL 9.4  9.1  8.6   Total Protein 6.5 - 8.1 g/dL 6.6  6.3  6.0   Total Bilirubin 0.3 - 1.2 mg/dL  0.5  0.6  0.4   Alkaline Phos 38 - 126 U/L 57  59  48   AST 15 - 41 U/L _2 ALT 0 - 44 U/L 32  24  30     DIAGNOSTIC IMAGING:  I have independently reviewed the scans and discussed with the Mckinney. No results found.   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  Stools have become bloody/mucousy lately. - She also reported pain in the tailbone region since March 2023.  She also has right-sided lower back pain. - 50 pound weight loss in the last 9 months, part of weight loss was intentional.  She cut back on eating sweets and lost taste to sweets after COVID infection. - CT [facility] with contrast on 03/08/2022: Irregular circumferential masslike wall thickening of sigmoid colon/rectum with adjacent perirectal adenopathy.  Multiple small hypodense lesions in the liver, largest 2.9 cm in the central aspect of the liver, question metastatic disease. - Colonoscopy on 03/21/2022 by Dr. Abbey Chatters: Fungating infiltrative nearly completely obstructing mass in the rectosigmoid colon, mass was circumferential measuring 4 cm in length. - Pathology: Rectal mass biopsy consistent with invasive moderately differentiated adenocarcinoma.  As there is very scant invasive tumor, MSI studies were deferred. - PET scan (03/23/2022): Hypermetabolic rectal primary long-segment with SUV 14.3.  Left posterior perirectal lymph node 7 mm with SUV 2.9.  Multiple tiny foci  of hepatic hypermetabolism. - MRI of the liver (04/01/2022): Multiple small hypovascular rim-enhancing liver lesions, predominantly in the right hepatic lobe measuring up to 1.1 cm.  3.3 cm hypervascular mass in the central liver, most consistent with FNH/hepatic adenoma. - Liver biopsy (04/20/2022): Metastatic moderately differentiated colonic adenocarcinoma with mucinous features - NGS testing shows K-ras G12 R mutation, PIK3CA exon 21 mutation.  MS-stable.   TMB-low. - Cycle 1 of FOLFOXIRI on 05/29/2022, bevacizumab added during cycle 2    Social/family history: - She is separated and is seen today with her daughter.  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  She is current active smoker, 1 pack/day for 27 years.  Denies drinking alcohol. - Paternal aunt had colon cancer.  Maternal cousin has breast cancer.  Maternal uncle had leukemia and maternal cousin had leukemia.   PLAN:  Metastatic rectal cancer to the liver: - She had cold sensitivity lasting about 7 days.  I have reviewed her labs today which showed normal LFTs and CBC.  Last CEA was 7.7.  Proceed with cycle 5 today.  We will dose reduce oxaliplatin to 75 mg/m2.  RTC 2 weeks for follow-up.  We will plan to repeat CTAP after 6 cycles.  2.  Sacral/right lower back pain: - Continue 2 tablets of hydrocodone and gabapentin 300 mg at bedtime which is helping.  Continue ibuprofen as needed during daytime.  3.  Peripheral neuropathy: - Tingling in the toes has completely resolved after we cut back on oxaliplatin dose.  She has cold sensitivity lasting about 7 days.  We will closely monitor.   Orders placed this encounter:  No orders of the defined types were placed in this encounter.    Derek Jack, MD [place] [phone]

## 2022-07-24 NOTE — Progress Notes (Signed)
Patients port flushed without difficulty.  Good blood return noted with no bruising or swelling noted at site.  Patient remains accessed for chemotherapy treatment.  

## 2022-07-25 LAB — CEA: CEA: 4.2 ng/mL (ref 0.0–4.7)

## 2022-07-26 ENCOUNTER — Inpatient Hospital Stay: Payer: BC Managed Care – PPO

## 2022-07-26 ENCOUNTER — Other Ambulatory Visit: Payer: Self-pay

## 2022-07-26 VITALS — BP 115/66 | HR 84 | Temp 98.0°F | Resp 20

## 2022-07-26 DIAGNOSIS — Z5111 Encounter for antineoplastic chemotherapy: Secondary | ICD-10-CM | POA: Diagnosis not present

## 2022-07-26 DIAGNOSIS — C20 Malignant neoplasm of rectum: Secondary | ICD-10-CM

## 2022-07-26 DIAGNOSIS — Z95828 Presence of other vascular implants and grafts: Secondary | ICD-10-CM

## 2022-07-26 MED ORDER — SODIUM CHLORIDE 0.9% FLUSH
10.0000 mL | INTRAVENOUS | Status: DC | PRN
  Administered 2022-07-26: 10 mL

## 2022-07-26 MED ORDER — HEPARIN SOD (PORK) LOCK FLUSH 100 UNIT/ML IV SOLN
500.0000 [IU] | Freq: Once | INTRAVENOUS | Status: AC | PRN
  Administered 2022-07-26: 500 [IU]

## 2022-07-26 NOTE — Progress Notes (Unsigned)
Patient here today for pump d/c per md orders.  Her pump is running with 7.42m remaining.  That amount was primed.  Patient tolerated well.  Patient denies any issues this treatment.

## 2022-07-27 ENCOUNTER — Encounter: Payer: Self-pay | Admitting: Hematology

## 2022-07-27 NOTE — Patient Instructions (Signed)
You had your pump d/c'd today.  Follow up in 2 weeks for next treatment.  You will need to pick up your contrast for CT at that next appointment.

## 2022-08-07 ENCOUNTER — Other Ambulatory Visit: Payer: Self-pay | Admitting: Hematology

## 2022-08-07 DIAGNOSIS — C20 Malignant neoplasm of rectum: Secondary | ICD-10-CM

## 2022-08-07 DIAGNOSIS — Z95828 Presence of other vascular implants and grafts: Secondary | ICD-10-CM

## 2022-08-08 ENCOUNTER — Inpatient Hospital Stay: Payer: BC Managed Care – PPO

## 2022-08-08 ENCOUNTER — Inpatient Hospital Stay (HOSPITAL_COMMUNITY): Payer: BC Managed Care – PPO | Attending: Hematology

## 2022-08-08 ENCOUNTER — Inpatient Hospital Stay: Payer: BC Managed Care – PPO | Admitting: Hematology

## 2022-08-08 VITALS — BP 104/63 | HR 66 | Temp 98.1°F | Resp 18

## 2022-08-08 DIAGNOSIS — Z5111 Encounter for antineoplastic chemotherapy: Secondary | ICD-10-CM | POA: Diagnosis present

## 2022-08-08 DIAGNOSIS — C787 Secondary malignant neoplasm of liver and intrahepatic bile duct: Secondary | ICD-10-CM | POA: Diagnosis not present

## 2022-08-08 DIAGNOSIS — Z95828 Presence of other vascular implants and grafts: Secondary | ICD-10-CM | POA: Diagnosis not present

## 2022-08-08 DIAGNOSIS — C20 Malignant neoplasm of rectum: Secondary | ICD-10-CM

## 2022-08-08 DIAGNOSIS — Z5189 Encounter for other specified aftercare: Secondary | ICD-10-CM

## 2022-08-08 DIAGNOSIS — G629 Polyneuropathy, unspecified: Secondary | ICD-10-CM | POA: Diagnosis not present

## 2022-08-08 DIAGNOSIS — K769 Liver disease, unspecified: Secondary | ICD-10-CM

## 2022-08-08 LAB — CBC WITH DIFFERENTIAL/PLATELET
Abs Immature Granulocytes: 0.01 10*3/uL (ref 0.00–0.07)
Basophils Absolute: 0 10*3/uL (ref 0.0–0.1)
Basophils Relative: 1 %
Eosinophils Absolute: 0.2 10*3/uL (ref 0.0–0.5)
Eosinophils Relative: 4 %
HCT: 38.9 % (ref 36.0–46.0)
Hemoglobin: 13.1 g/dL (ref 12.0–15.0)
Immature Granulocytes: 0 %
Lymphocytes Relative: 45 %
Lymphs Abs: 2 10*3/uL (ref 0.7–4.0)
MCH: 32.8 pg (ref 26.0–34.0)
MCHC: 33.7 g/dL (ref 30.0–36.0)
MCV: 97.5 fL (ref 80.0–100.0)
Monocytes Absolute: 0.5 10*3/uL (ref 0.1–1.0)
Monocytes Relative: 10 %
Neutro Abs: 1.8 10*3/uL (ref 1.7–7.7)
Neutrophils Relative %: 40 %
Platelets: 217 10*3/uL (ref 150–400)
RBC: 3.99 MIL/uL (ref 3.87–5.11)
RDW: 15.9 % — ABNORMAL HIGH (ref 11.5–15.5)
WBC: 4.5 10*3/uL (ref 4.0–10.5)
nRBC: 0 % (ref 0.0–0.2)

## 2022-08-08 LAB — COMPREHENSIVE METABOLIC PANEL
ALT: 25 U/L (ref 0–44)
AST: 18 U/L (ref 15–41)
Albumin: 3.6 g/dL (ref 3.5–5.0)
Alkaline Phosphatase: 63 U/L (ref 38–126)
Anion gap: 7 (ref 5–15)
BUN: 12 mg/dL (ref 6–20)
CO2: 23 mmol/L (ref 22–32)
Calcium: 9.1 mg/dL (ref 8.9–10.3)
Chloride: 109 mmol/L (ref 98–111)
Creatinine, Ser: 0.64 mg/dL (ref 0.44–1.00)
GFR, Estimated: >60 mL/min (ref >60)
Glucose, Bld: 105 mg/dL — ABNORMAL HIGH (ref 70–99)
Potassium: 4.3 mmol/L (ref 3.5–5.1)
Sodium: 139 mmol/L (ref 135–145)
Total Bilirubin: 0.5 mg/dL (ref 0.3–1.2)
Total Protein: 6.5 g/dL (ref 6.5–8.1)

## 2022-08-08 LAB — URINALYSIS, DIPSTICK ONLY
Bilirubin Urine: NEGATIVE
Glucose, UA: NEGATIVE mg/dL
Hgb urine dipstick: NEGATIVE
Ketones, ur: NEGATIVE mg/dL
Leukocytes,Ua: NEGATIVE
Nitrite: NEGATIVE
Protein, ur: NEGATIVE mg/dL
Specific Gravity, Urine: 1.03 (ref 1.005–1.030)
pH: 5 (ref 5.0–8.0)

## 2022-08-08 LAB — PREGNANCY, URINE: Preg Test, Ur: NEGATIVE

## 2022-08-08 LAB — MAGNESIUM: Magnesium: 2.1 mg/dL (ref 1.7–2.4)

## 2022-08-08 MED ORDER — ATROPINE SULFATE 1 MG/ML IV SOLN
0.5000 mg | Freq: Once | INTRAVENOUS | Status: AC
  Administered 2022-08-08: 0.5 mg via INTRAVENOUS
  Filled 2022-08-08: qty 1

## 2022-08-08 MED ORDER — SODIUM CHLORIDE 0.9 % IV SOLN
10.0000 mg | Freq: Once | INTRAVENOUS | Status: AC
  Administered 2022-08-08: 10 mg via INTRAVENOUS
  Filled 2022-08-08: qty 1

## 2022-08-08 MED ORDER — SODIUM CHLORIDE 0.9 % IV SOLN
5.0000 mg/kg | Freq: Once | INTRAVENOUS | Status: AC
  Administered 2022-08-08: 300 mg via INTRAVENOUS
  Filled 2022-08-08: qty 12

## 2022-08-08 MED ORDER — SODIUM CHLORIDE 0.9 % IV SOLN
2400.0000 mg/m2 | INTRAVENOUS | Status: DC
  Administered 2022-08-08: 4150 mg via INTRAVENOUS
  Filled 2022-08-08: qty 83

## 2022-08-08 MED ORDER — SODIUM CHLORIDE 0.9 % IV SOLN: Freq: Once | INTRAVENOUS | Status: AC

## 2022-08-08 MED ORDER — DEXTROSE 5 % IV SOLN: Freq: Once | INTRAVENOUS | Status: AC

## 2022-08-08 MED ORDER — PALONOSETRON HCL INJECTION 0.25 MG/5ML
0.2500 mg | Freq: Once | INTRAVENOUS | Status: AC
  Administered 2022-08-08: 0.25 mg via INTRAVENOUS
  Filled 2022-08-08: qty 5

## 2022-08-08 MED ORDER — OXALIPLATIN CHEMO INJECTION 100 MG/20ML
75.0000 mg/m2 | Freq: Once | INTRAVENOUS | Status: AC
  Administered 2022-08-08: 130 mg via INTRAVENOUS
  Filled 2022-08-08: qty 20

## 2022-08-08 MED ORDER — SODIUM CHLORIDE 0.9 % IV SOLN
150.0000 mg | Freq: Once | INTRAVENOUS | Status: AC
  Administered 2022-08-08: 150 mg via INTRAVENOUS
  Filled 2022-08-08: qty 5

## 2022-08-08 MED ORDER — LEUCOVORIN CALCIUM INJECTION 350 MG
400.0000 mg/m2 | Freq: Once | INTRAVENOUS | Status: AC
  Administered 2022-08-08: 692 mg via INTRAVENOUS
  Filled 2022-08-08: qty 34.6

## 2022-08-08 MED ORDER — ATROPINE SULFATE 1 MG/ML IV SOLN: 0.5000 mg | Freq: Once | INTRAVENOUS | Status: DC

## 2022-08-08 MED ORDER — SODIUM CHLORIDE 0.9 % IV SOLN
165.0000 mg/m2 | Freq: Once | INTRAVENOUS | Status: AC
  Administered 2022-08-08: 280 mg via INTRAVENOUS
  Filled 2022-08-08: qty 4

## 2022-08-08 NOTE — Progress Notes (Signed)
Patient has been examined by Dr. Katragadda, and vital signs and labs have been reviewed. ANC, Creatinine, LFTs, hemoglobin, and platelets are within treatment parameters per M.D. - pt may proceed with treatment.  Primary RN and pharmacy notified.  

## 2022-08-08 NOTE — Patient Instructions (Addendum)
[place] at [facility] Discharge Instructions   You were seen and examined today by Dr. Delton Coombes.  He reviewed the results of your lab work which are normal/stable.   We will proceed with your treatment today.  We will obtain a CT scan prior to your next treatment.   Return as scheduled.    Thank you for choosing [place] at [facility] to provide your oncology and hematology care.  To afford each patient quality time with our provider, please arrive at least 15 minutes before your scheduled appointment time.   If you have a lab appointment with the [facility] please come in thru the Main Entrance and check in at the main information desk.  You need to re-schedule your appointment should you arrive 10 or more minutes late.  We strive to give you quality time with our providers, and arriving late affects you and other patients whose appointments are after yours.  Also, if you no show three or more times for appointments you may be dismissed from the clinic at the providers discretion.     Again, thank you for choosing [facility].  Our hope is that these requests will decrease the amount of time that you wait before being seen by our physicians.       _____________________________________________________________  Should you have questions after your visit to [facility], please contact our office at [phone] and follow the prompts.  Our office hours are 8:00 a.m. and 4:30 p.m. Monday - Friday.  Please note that voicemails left after 4:00 p.m. may not be returned until the following business day.  We are closed weekends and major holidays.  You do have access to a nurse 24-7, just call the main number to the clinic [phone] and do not press any options, hold on the line and a nurse will answer the phone.    For prescription refill requests, have your pharmacy contact our office and allow 72 hours.     Due to Covid, you will need to wear a mask upon entering the hospital. If you do not have a mask, a mask will be given to you at the Main Entrance upon arrival. For doctor visits, patients may have 1 support person age 43 or older with them. For treatment visits, patients can not have anyone with them due to social distancing guidelines and our immunocompromised population.

## 2022-08-08 NOTE — Progress Notes (Signed)
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  PRIOR THERAPY: none  NGS Results: not done  CURRENT THERAPY: FOLFOXIRI + Bevacizumab q14d  BRIEF ONCOLOGIC HISTORY:  Oncology History  Rectal cancer ([place])  03/28/2022 Initial Diagnosis   Rectal cancer ([facility])   05/29/2022 - 07/26/2022 Chemotherapy   Patient is on Treatment Plan : COLORECTAL FOLFOXIRI + Bevacizumab q14d     05/29/2022 -  Chemotherapy   Patient is on Treatment Plan : COLORECTAL FOLFOXIRI + Bevacizumab q14d       CANCER STAGING:  Cancer Staging  Rectal cancer ([place] [facility]) Staging form: Colon and Rectum, AJCC 8th Edition - Clinical stage from 03/28/2022: Stage IVA (cTX, cN1, cM1a) - Unsigned   INTERVAL HISTORY:  Ms. Tonia Avino, a 41 y.o. female, returns for follow-up and toxicity assessment prior to next cycle of chemotherapy.  She reported cold sensitivity lasting about 7 days.  Denies any tingling or numbness in extremities.  Reported some night sweats, 2 times per night in the last 2 weeks intermittently.  She did not have to change her T-shirt or sheets.  In the last 2 weeks, she has stopped taking pain medication including gabapentin completely and she is pain-free.   REVIEW OF SYSTEMS:  Review of Systems  Constitutional:  Negative for appetite change, fatigue and unexpected weight change.  HENT:   Negative for nosebleeds.   Cardiovascular:  Negative for leg swelling.  Gastrointestinal:  Negative for abdominal pain, blood in stool and diarrhea.  Genitourinary:  Negative for hematuria.   Neurological:  Negative for numbness.  Psychiatric/Behavioral:  Negative for sleep disturbance.   All other systems reviewed and are negative.   PAST MEDICAL/SURGICAL HISTORY:  Past Medical History:   Diagnosis Date   Cancer [facility])    Depression, major, single episode, mild ([place])    Port-A-Cath in place 05/24/2022   Past Surgical History:  Procedure Laterality Date   BIOPSY  03/21/2022   Procedure: BIOPSY;  Surgeon: [name], DO;  Location: [facility];  Service: Endoscopy;;   COLONOSCOPY WITH PROPOFOL N/A 03/21/2022   Procedure: COLONOSCOPY WITH PROPOFOL;  Surgeon: [name], DO;  Location: [facility];  Service: Endoscopy;  Laterality: N/A;  8:30am   FLEXIBLE SIGMOIDOSCOPY N/A 05/19/2022   Procedure: FLEXIBLE SIGMOIDOSCOPY WITH TATTOO INJECTION;  Surgeon: Leighton Ruff, MD;  Location: [facility] OR[facility];  Service: General;  Laterality: N/A;   LAPAROSCOPIC DIVERTED COLOSTOMY N/A 05/19/2022   Procedure: LAPAROSCOPIC DIVERTED OSTOMY;  Surgeon: Leighton Ruff, MD;  Location: [facility];  Service: General;  Laterality: N/A;   PORTACATH PLACEMENT Right 05/19/2022   Procedure: INSERTION PORT-A-CATH;  Surgeon: Leighton Ruff, MD;  Location: [facility];  Service: General;  Laterality: Right;    SOCIAL HISTORY:  Social History   Socioeconomic History   Marital status: Married    Spouse name: Not on file   Number of children: Not on file   Years of education: Not on file   Highest education level: Not on file  Occupational History   Not on file  Tobacco Use   Smoking status: Every Day    Packs/day: 2.00    Years: 27.00    Total pack years: 54.00    Types: Cigarettes   Smokeless tobacco: Never  Vaping Use  Vaping Use: Never used  Substance and Sexual Activity   Alcohol use: No   Drug use: No   Sexual activity: Yes    Birth control/protection: Injection  Other Topics Concern   Not on file  Social History Narrative   Not on file   Social Determinants of Health   Financial Resource Strain: Not on file  Food Insecurity: Not on file  Transportation Needs: Not on file  Physical Activity: Not on file  Stress: Not on file  Social Connections: Not on file  Intimate Partner  Violence: Not on file    FAMILY HISTORY:  Family History  Problem Relation Age of Onset   Heart disease Mother    Diabetes Mother    Hearing loss Father    Heart disease Father    Hyperlipidemia Father    Hypertension Father    Stroke Father    Arthritis Father    Diabetes Father    Learning disabilities Brother    Diabetes Maternal Grandmother    Heart disease Maternal Grandmother    Diabetes Maternal Grandfather    Diabetes Paternal Grandmother    Diabetes Paternal Grandfather     CURRENT MEDICATIONS:  Current Outpatient Medications  Medication Sig Dispense Refill   Bevacizumab (AVASTIN IV) Inject into the vein every 14 (fourteen) days.     Calcium-Cholecalciferol-Zinc 650-20-5.5 MG-MCG-MG CHEW Chew by mouth.     cetirizine (ZYRTEC) 10 MG tablet Take 10 mg by mouth daily.     fluorouracil CALGB 87867 2,400 mg/m2 in sodium chloride 0.9 % 150 mL Inject 2,400 mg/m2 into the vein over 48 hr.     FLUOROURACIL IV Inject into the vein every 14 (fourteen) days.     ibuprofen (ADVIL) 200 MG tablet Take 1,000 mg by mouth every 6 (six) hours as needed for moderate pain.     IRINOTECAN HCL IV Inject into the vein every 14 (fourteen) days.     LEUCOVORIN CALCIUM IV Inject into the vein every 14 (fourteen) days.     medroxyPROGESTERone (DEPO-PROVERA) 150 MG/ML injection INJECT 1 ML (150 MG TOTAL) INTO THE MUSCLE EVERY 3 (THREE) MONTHS 1 mL 0   Multiple Vitamin (MULTIVITAMIN) tablet Take 1 tablet by mouth daily.     OXALIPLATIN IV Inject into the vein every 14 (fourteen) days.     predniSONE (DELTASONE) 20 MG tablet 2 po at sametime daily for 5 days- start tomorrow 10 tablet 0   famotidine (PEPCID) 20 MG tablet Take 1 tablet (20 mg total) by mouth 2 (two) times daily for 14 days. 28 tablet 0   gabapentin (NEURONTIN) 300 MG capsule Take 1 capsule (300 mg total) by mouth 3 (three) times daily. (Patient not taking: Reported on 08/08/2022) 30 capsule 3   HYDROcodone-acetaminophen (NORCO) 5-325  MG tablet Take 1 tablet by mouth every 8 (eight) hours as needed for moderate pain. (Patient not taking: Reported on 08/08/2022) 90 tablet 0   No current facility-administered medications for this visit.   Facility-Administered Medications Ordered in Other Visits  Medication Dose Route Frequency Provider Last Rate Last Admin   atropine injection 0.5 mg  0.5 mg Intravenous Once Derek Jack, MD       bevacizumab-bvzr (ZIRABEV) 300 mg in sodium chloride 0.9 % 100 mL chemo infusion  5 mg/kg (Treatment Plan Recorded) Intravenous Once Derek Jack, MD       dexamethasone (DECADRON) 10 mg in sodium chloride 0.9 % 50 mL IVPB  10 mg Intravenous Once Derek Jack, MD  dextrose 5 % solution   Intravenous Once Derek Jack, MD       fluorouracil (ADRUCIL) 4,150 mg in sodium chloride 0.9 % 67 mL chemo infusion  2,400 mg/m2 (Treatment Plan Recorded) Intravenous 1 day or 1 dose Derek Jack, MD       fosaprepitant (EMEND) 150 mg in sodium chloride 0.9 % 145 mL IVPB  150 mg Intravenous Once Derek Jack, MD       irinotecan (CAMPTOSAR) 280 mg in sodium chloride 0.9 % 500 mL chemo infusion  165 mg/m2 (Treatment Plan Recorded) Intravenous Once Derek Jack, MD       leucovorin 692 mg in dextrose 5 % 250 mL infusion  400 mg/m2 (Treatment Plan Recorded) Intravenous Once Derek Jack, MD       oxaliplatin (ELOXATIN) 130 mg in dextrose 5 % 500 mL chemo infusion  75 mg/m2 (Treatment Plan Recorded) Intravenous Once Derek Jack, MD        ALLERGIES:  Allergies  Allergen Reactions   Wellbutrin [Bupropion]     insomnia   Tramadol Rash    PHYSICAL EXAM:  Performance status (ECOG): 0 - Asymptomatic  There were no vitals filed for this visit. Wt Readings from Last 3 Encounters:  08/08/22 141 lb 12.8 oz (64.3 kg)  07/24/22 137 lb 6.4 oz (62.3 kg)  07/10/22 137 lb 5.6 oz (62.3 kg)   Physical Exam Vitals reviewed.  Constitutional:       Appearance: Normal appearance.  Cardiovascular:     Rate and Rhythm: Normal rate and regular rhythm.     Pulses: Normal pulses.     Heart sounds: Normal heart sounds.  Pulmonary:     Effort: Pulmonary effort is normal.     Breath sounds: Normal breath sounds.  Neurological:     General: No focal deficit present.     Mental Status: She is alert and oriented to person, place, and time.  Psychiatric:        Mood and Affect: Mood normal.        Behavior: Behavior normal.     LABORATORY DATA:  I have reviewed the labs as listed.     Latest Ref Rng & Units 08/08/2022    8:52 AM 07/24/2022    8:43 AM 07/10/2022    9:44 AM  CBC  WBC 4.0 - 10.5 K/uL 4.5  4.8  6.1   Hemoglobin 12.0 - 15.0 g/dL 13.1  12.2  12.1   Hematocrit 36.0 - 46.0 % 38.9  36.3  36.4   Platelets 150 - 400 K/uL 217  195  207       Latest Ref Rng & Units 08/08/2022    8:52 AM 07/24/2022    8:43 AM 07/10/2022    9:44 AM  CMP  Glucose 70 - 99 mg/dL 105  99  128   BUN 6 - 20 mg/dL _0 Creatinine 0.44 - 1.00 mg/dL 0.64  0.51  0.65   Sodium 135 - 145 mmol/L 139  139  137   Potassium 3.5 - 5.1 mmol/L 4.3  4.1  4.1   Chloride 98 - 111 mmol/L 109  108  109   CO2 22 - 32 mmol/L _1 Calcium 8.9 - 10.3 mg/dL 9.1  9.0  9.4   Total Protein 6.5 - 8.1 g/dL 6.5  6.4  6.6   Total Bilirubin 0.3 - 1.2 mg/dL 0.5  0.5  0.5   Alkaline Phos 38 -  126 U/L 63  52  57   AST 15 - 41 U/L _0 ALT 0 - 44 U/L 25  21  32     DIAGNOSTIC IMAGING:  I have independently reviewed the scans and discussed with the patient. No results found.   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  Stools have become bloody/mucousy lately. - She also reported pain in the tailbone region since March 2023.  She also has right-sided lower back pain. - 50 pound weight loss in the last 9 months, part of weight loss was intentional.  She cut back on  eating sweets and lost taste to sweets after COVID infection. - CT [facility] with contrast on 03/08/2022: Irregular circumferential masslike wall thickening of sigmoid colon/rectum with adjacent perirectal adenopathy.  Multiple small hypodense lesions in the liver, largest 2.9 cm in the central aspect of the liver, question metastatic disease. - Colonoscopy on 03/21/2022 by Dr. Abbey Chatters: Fungating infiltrative nearly completely obstructing mass in the rectosigmoid colon, mass was circumferential measuring 4 cm in length. - Pathology: Rectal mass biopsy consistent with invasive moderately differentiated adenocarcinoma.  As there is very scant invasive tumor, MSI studies were deferred. - PET scan (03/23/2022): Hypermetabolic rectal primary long-segment with SUV 14.3.  Left posterior perirectal lymph node 7 mm with SUV 2.9.  Multiple tiny foci of hepatic hypermetabolism. - MRI of the liver (04/01/2022): Multiple small hypovascular rim-enhancing liver lesions, predominantly in the right hepatic lobe measuring up to 1.1 cm.  3.3 cm hypervascular mass in the central liver, most consistent with FNH/hepatic adenoma. - Liver biopsy (04/20/2022): Metastatic moderately differentiated colonic adenocarcinoma with mucinous features - NGS testing shows K-ras G12 R mutation, PIK3CA exon 21 mutation.  MS-stable.  TMB-low. - Cycle 1 of FOLFOXIRI on 05/29/2022, bevacizumab added during cycle 2    Social/family history: - She is separated and is seen today with her daughter.  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  She is current active smoker, 1 pack/day for 27 years.  Denies drinking alcohol. - Paternal aunt had colon cancer.  Maternal cousin has breast cancer.  Maternal uncle had leukemia and maternal cousin had leukemia.   PLAN:  Metastatic rectal cancer to the liver: - Reviewed labs today which showed normal LFTs.  CBC was grossly normal.  Last CEA was 4.2 improved from 13.4 prior to start of therapy indicating response.   Recommend proceeding with cycle 6 today.  We will arrange for CT CAP prior to next visit.  We will keep a close eye on the night sweats which started after last treatment.  RTC 2 weeks to discuss results of the CT scan.  I will see her back in 6 weeks for follow-up.  Continue treatments every 2 weeks in the interim.  She has an appointment to see Dr. Marcello Moores in December.  After the scan, I will also reach out to Dr. Marcello Moores about the optimal presurgical plan and whether to include radiation.  2.  Sacral/right lower back pain: - Pain has completely improved since cycle 5.  Not requiring hydrocodone and gabapentin or ibuprofen.  3.  Peripheral neuropathy: - Cold sensitivity lasted 7 days after last treatment.  Tingling in the toes has completely resolved after dose of oxaliplatin was cut back.   Orders placed this encounter:  No orders of the defined types were placed in this encounter.  Derek Jack, MD [facility] [phone]

## 2022-08-08 NOTE — Patient Instructions (Signed)
[place]  Discharge Instructions: Thank you for choosing [facility] to provide your oncology and hematology care.  If you have a lab appointment with the [facility], please come in thru the Main Entrance and check in at the main information desk.  Wear comfortable clothing and clothing appropriate for easy access to any Portacath or PICC line.   We strive to give you quality time with your provider. You may need to reschedule your appointment if you arrive late (15 or more minutes).  Arriving late affects you and other patients whose appointments are after yours.  Also, if you miss three or more appointments without notifying the office, you may be dismissed from the clinic at the provider's discretion.      For prescription refill requests, have your pharmacy contact our office and allow 72 hours for refills to be completed.    Today you received the following chemotherapy and/or immunotherapy agents Zirabev/Irinotecan/Leucovorin/Oxaliplatin/Fluoruoracil.  Fluorouracil Injection What is this medication? FLUOROURACIL (flure oh YOOR a sil) treats some types of cancer. It works by slowing down the growth of cancer cells. This medicine may be used for other purposes; ask your health care provider or pharmacist if you have questions. COMMON BRAND NAME(S): Adrucil What should I tell my care team before I take this medication? They need to know if you have any of these conditions: Blood disorders Dihydropyrimidine dehydrogenase (DPD) deficiency Infection, such as chickenpox, cold sores, herpes Kidney disease Liver disease Poor nutrition Recent or ongoing radiation therapy An unusual or allergic reaction to fluorouracil, other medications, foods, dyes, or preservatives If you or your partner are pregnant or trying to get pregnant Breast-feeding How should I use this medication? This medication is injected into a vein. It is administered by your care  team in a hospital or clinic setting. Talk to your care team about the use of this medication in children. Special care may be needed. Overdosage: If you think you have taken too much of this medicine contact a poison control center or emergency room at once. NOTE: This medicine is only for you. Do not share this medicine with others. What if I miss a dose? Keep appointments for follow-up doses. It is important not to miss your dose. Call your care team if you are unable to keep an appointment. What may interact with this medication? Do not take this medication with any of the following: Live virus vaccines This medication may also interact with the following: Medications that treat or prevent blood clots, such as warfarin, enoxaparin, dalteparin This list may not describe all possible interactions. Give your health care provider a list of all the medicines, herbs, non-prescription drugs, or dietary supplements you use. Also tell them if you smoke, drink alcohol, or use illegal drugs. Some items may interact with your medicine. What should I watch for while using this medication? Your condition will be monitored carefully while you are receiving this medication. This medication may make you feel generally unwell. This is not uncommon as chemotherapy can affect healthy cells as well as cancer cells. Report any side effects. Continue your course of treatment even though you feel ill unless your care team tells you to stop. In some cases, you may be given additional medications to help with side effects. Follow all directions for their use. This medication may increase your risk of getting an infection. Call your care team for advice if you get a fever, chills, sore throat, or other symptoms  of a cold or flu. Do not treat yourself. Try to avoid being around people who are sick. This medication may increase your risk to bruise or bleed. Call your care team if you notice any unusual bleeding. Be careful  brushing or flossing your teeth or using a toothpick because you may get an infection or bleed more easily. If you have any dental work done, tell your dentist you are receiving this medication. Avoid taking medications that contain aspirin, acetaminophen, ibuprofen, naproxen, or ketoprofen unless instructed by your care team. These medications may hide a fever. Do not treat diarrhea with over the counter products. Contact your care team if you have diarrhea that lasts more than 2 days or if it is severe and watery. This medication can make you more sensitive to the sun. Keep out of the sun. If you cannot avoid being in the sun, wear protective clothing and sunscreen. Do not use sun lamps, tanning beds, or tanning booths. Talk to your care team if you or your partner wish to become pregnant or think you might be pregnant. This medication can cause serious birth defects if taken during pregnancy and for 3 months after the last dose. A reliable form of contraception is recommended while taking this medication and for 3 months after the last dose. Talk to your care team about effective forms of contraception. Do not father a child while taking this medication and for 3 months after the last dose. Use a condom while having sex during this time period. Do not breastfeed while taking this medication. This medication may cause infertility. Talk to your care team if you are concerned about your fertility. What side effects may I notice from receiving this medication? Side effects that you should report to your care team as soon as possible: Allergic reactions--skin rash, itching, hives, swelling of the face, lips, tongue, or throat Heart attack--pain or tightness in the chest, shoulders, arms, or jaw, nausea, shortness of breath, cold or clammy skin, feeling faint or lightheaded Heart failure--shortness of breath, swelling of the ankles, feet, or hands, sudden weight gain, unusual weakness or fatigue Heart  rhythm changes--fast or irregular heartbeat, dizziness, feeling faint or lightheaded, chest pain, trouble breathing High ammonia level--unusual weakness or fatigue, confusion, loss of appetite, nausea, vomiting, seizures Infection--fever, chills, cough, sore throat, wounds that don't heal, pain or trouble when passing urine, general feeling of discomfort or being unwell Low red blood cell level--unusual weakness or fatigue, dizziness, headache, trouble breathing Pain, tingling, or numbness in the hands or feet, muscle weakness, change in vision, confusion or trouble speaking, loss of balance or coordination, trouble walking, seizures Redness, swelling, and blistering of the skin over hands and feet Severe or prolonged diarrhea Unusual bruising or bleeding Side effects that usually do not require medical attention (report to your care team if they continue or are bothersome): Dry skin Headache Increased tears Nausea Pain, redness, or swelling with sores inside the mouth or throat Sensitivity to light Vomiting This list may not describe all possible side effects. Call your doctor for medical advice about side effects. You may report side effects to FDA at 1-800-FDA-1088. Where should I keep my medication? This medication is given in a hospital or clinic. It will not be stored at home. NOTE: This sheet is a summary. It may not cover all possible information. If you have questions about this medicine, talk to your doctor, pharmacist, or health care provider.  2023 Elsevier/Gold Standard (2022-03-28 00:00:00)   Oxaliplatin Injection  What is this medication? OXALIPLATIN (ox AL i PLA tin) treats some types of cancer. It works by slowing down the growth of cancer cells. This medicine may be used for other purposes; ask your health care provider or pharmacist if you have questions. COMMON BRAND NAME(S): Eloxatin What should I tell my care team before I take this medication? They need to know if  you have any of these conditions: Heart disease History of irregular heartbeat or rhythm Liver disease Low blood cell levels (white cells, red cells, and platelets) Lung or breathing disease, such as asthma Take medications that treat or prevent blood clots Tingling of the fingers, toes, or other nerve disorder An unusual or allergic reaction to oxaliplatin, other medications, foods, dyes, or preservatives If you or your partner are pregnant or trying to get pregnant Breast-feeding How should I use this medication? This medication is injected into a vein. It is given by your care team in a hospital or clinic setting. Talk to your care team about the use of this medication in children. Special care may be needed. Overdosage: If you think you have taken too much of this medicine contact a poison control center or emergency room at once. NOTE: This medicine is only for you. Do not share this medicine with others. What if I miss a dose? Keep appointments for follow-up doses. It is important not to miss a dose. Call your care team if you are unable to keep an appointment. What may interact with this medication? Do not take this medication with any of the following: Cisapride Dronedarone Pimozide Thioridazine This medication may also interact with the following: Aspirin and aspirin-like medications Certain medications that treat or prevent blood clots, such as warfarin, apixaban, dabigatran, and rivaroxaban Cisplatin Cyclosporine Diuretics Medications for infection, such as acyclovir, adefovir, amphotericin B, bacitracin, cidofovir, foscarnet, ganciclovir, gentamicin, pentamidine, vancomycin NSAIDs, medications for pain and inflammation, such as ibuprofen or naproxen Other medications that cause heart rhythm changes Pamidronate Zoledronic acid This list may not describe all possible interactions. Give your health care provider a list of all the medicines, herbs, non-prescription drugs,  or dietary supplements you use. Also tell them if you smoke, drink alcohol, or use illegal drugs. Some items may interact with your medicine. What should I watch for while using this medication? Your condition will be monitored carefully while you are receiving this medication. You may need blood work while taking this medication. This medication may make you feel generally unwell. This is not uncommon as chemotherapy can affect healthy cells as well as cancer cells. Report any side effects. Continue your course of treatment even though you feel ill unless your care team tells you to stop. This medication may increase your risk of getting an infection. Call your care team for advice if you get a fever, chills, sore throat, or other symptoms of a cold or flu. Do not treat yourself. Try to avoid being around people who are sick. Avoid taking medications that contain aspirin, acetaminophen, ibuprofen, naproxen, or ketoprofen unless instructed by your care team. These medications may hide a fever. Be careful brushing or flossing your teeth or using a toothpick because you may get an infection or bleed more easily. If you have any dental work done, tell your dentist you are receiving this medication. This medication can make you more sensitive to cold. Do not drink cold drinks or use ice. Cover exposed skin before coming in contact with cold temperatures or cold objects. When out in cold  weather wear warm clothing and cover your mouth and nose to warm the air that goes into your lungs. Tell your care team if you get sensitive to the cold. Talk to your care team if you or your partner are pregnant or think either of you might be pregnant. This medication can cause serious birth defects if taken during pregnancy and for 9 months after the last dose. A negative pregnancy test is required before starting this medication. A reliable form of contraception is recommended while taking this medication and for 9 months  after the last dose. Talk to your care team about effective forms of contraception. Do not father a child while taking this medication and for 6 months after the last dose. Use a condom while having sex during this time period. Do not breastfeed while taking this medication and for 3 months after the last dose. This medication may cause infertility. Talk to your care team if you are concerned about your fertility. What side effects may I notice from receiving this medication? Side effects that you should report to your care team as soon as possible: Allergic reactions--skin rash, itching, hives, swelling of the face, lips, tongue, or throat Bleeding--bloody or black, tar-like stools, vomiting blood or Rooney Gladwin material that looks like coffee grounds, red or dark Scharlene Catalina urine, small red or purple spots on skin, unusual bruising or bleeding Dry cough, shortness of breath or trouble breathing Heart rhythm changes--fast or irregular heartbeat, dizziness, feeling faint or lightheaded, chest pain, trouble breathing Infection--fever, chills, cough, sore throat, wounds that don't heal, pain or trouble when passing urine, general feeling of discomfort or being unwell Liver injury--right upper belly pain, loss of appetite, nausea, light-colored stool, dark yellow or Marjan Rosman urine, yellowing skin or eyes, unusual weakness or fatigue Low red blood cell level--unusual weakness or fatigue, dizziness, headache, trouble breathing Muscle injury--unusual weakness or fatigue, muscle pain, dark yellow or Hyland Mollenkopf urine, decrease in amount of urine Pain, tingling, or numbness in the hands or feet Sudden and severe headache, confusion, change in vision, seizures, which may be signs of posterior reversible encephalopathy syndrome (PRES) Unusual bruising or bleeding Side effects that usually do not require medical attention (report to your care team if they continue or are bothersome): Diarrhea Nausea Pain, redness, or swelling  with sores inside the mouth or throat Unusual weakness or fatigue Vomiting This list may not describe all possible side effects. Call your doctor for medical advice about side effects. You may report side effects to FDA at 1-800-FDA-1088. Where should I keep my medication? This medication is given in a hospital or clinic. It will not be stored at home. NOTE: This sheet is a summary. It may not cover all possible information. If you have questions about this medicine, talk to your doctor, pharmacist, or health care provider.  2023 Elsevier/Gold Standard (2022-03-17 00:00:00)   Leucovorin Injection What is this medication? LEUCOVORIN (loo koe VOR in) prevents side effects from certain medications, such as methotrexate. It works by increasing folate levels. This helps protect healthy cells in your body. It may also be used to treat anemia caused by low levels of folate. It can also be used with fluorouracil, a type of chemotherapy, to treat colorectal cancer. It works by increasing the effects of fluorouracil in the body. This medicine may be used for other purposes; ask your health care provider or pharmacist if you have questions. What should I tell my care team before I take this medication? They need to know  if you have any of these conditions: Anemia from low levels of vitamin B12 in the blood An unusual or allergic reaction to leucovorin, folic acid, other medications, foods, dyes, or preservatives Pregnant or trying to get pregnant Breastfeeding How should I use this medication? This medication is injected into a vein or a muscle. It is given by your care team in a hospital or clinic setting. Talk to your care team about the use of this medication in children. Special care may be needed. Overdosage: If you think you have taken too much of this medicine contact a poison control center or emergency room at once. NOTE: This medicine is only for you. Do not share this medicine with  others. What if I miss a dose? Keep appointments for follow-up doses. It is important not to miss your dose. Call your care team if you are unable to keep an appointment. What may interact with this medication? Capecitabine Fluorouracil Phenobarbital Phenytoin Primidone Trimethoprim;sulfamethoxazole This list may not describe all possible interactions. Give your health care provider a list of all the medicines, herbs, non-prescription drugs, or dietary supplements you use. Also tell them if you smoke, drink alcohol, or use illegal drugs. Some items may interact with your medicine. What should I watch for while using this medication? Your condition will be monitored carefully while you are receiving this medication. This medication may increase the side effects of 5-fluorouracil. Tell your care team if you have diarrhea or mouth sores that do not get better or that get worse. What side effects may I notice from receiving this medication? Side effects that you should report to your care team as soon as possible: Allergic reactions--skin rash, itching, hives, swelling of the face, lips, tongue, or throat This list may not describe all possible side effects. Call your doctor for medical advice about side effects. You may report side effects to FDA at 1-800-FDA-1088. Where should I keep my medication? This medication is given in a hospital or clinic. It will not be stored at home. NOTE: This sheet is a summary. It may not cover all possible information. If you have questions about this medicine, talk to your doctor, pharmacist, or health care provider.  2023 Elsevier/Gold Standard (2022-03-31 00:00:00)   Irinotecan Injection What is this medication? IRINOTECAN (ir in oh TEE kan) treats some types of cancer. It works by slowing down the growth of cancer cells. This medicine may be used for other purposes; ask your health care provider or pharmacist if you have questions. COMMON BRAND NAME(S):  Camptosar What should I tell my care team before I take this medication? They need to know if you have any of these conditions: Dehydration Diarrhea Infection, especially a viral infection, such as chickenpox, cold sores, herpes Liver disease Low blood cell levels (white cells, red cells, and platelets) Low levels of electrolytes, such as calcium, magnesium, or potassium in your blood Recent or ongoing radiation An unusual or allergic reaction to irinotecan, other medications, foods, dyes, or preservatives If you or your partner are pregnant or trying to get pregnant Breast-feeding How should I use this medication? This medication is injected into a vein. It is given by your care team in a hospital or clinic setting. Talk to your care team about the use of this medication in children. Special care may be needed. Overdosage: If you think you have taken too much of this medicine contact a poison control center or emergency room at once. NOTE: This medicine is only for you.  Do not share this medicine with others. What if I miss a dose? Keep appointments for follow-up doses. It is important not to miss your dose. Call your care team if you are unable to keep an appointment. What may interact with this medication? Do not take this medication with any of the following: Cobicistat Itraconazole This medication may also interact with the following: Certain antibiotics, such as clarithromycin, rifampin, rifabutin Certain antivirals for HIV or AIDS Certain medications for fungal infections, such as ketoconazole, posaconazole, voriconazole Certain medications for seizures, such as carbamazepine, phenobarbital, phenytoin Gemfibrozil Nefazodone St. John's wort This list may not describe all possible interactions. Give your health care provider a list of all the medicines, herbs, non-prescription drugs, or dietary supplements you use. Also tell them if you smoke, drink alcohol, or use illegal drugs.  Some items may interact with your medicine. What should I watch for while using this medication? Your condition will be monitored carefully while you are receiving this medication. You may need blood work while taking this medication. This medication may make you feel generally unwell. This is not uncommon as chemotherapy can affect healthy cells as well as cancer cells. Report any side effects. Continue your course of treatment even though you feel ill unless your care team tells you to stop. This medication can cause serious side effects. To reduce the risk, your care team may give you other medications to take before receiving this one. Be sure to follow the directions from your care team. This medication may affect your coordination, reaction time, or judgement. Do not drive or operate machinery until you know how this medication affects you. Sit up or stand slowly to reduce the risk of dizzy or fainting spells. Drinking alcohol with this medication can increase the risk of these side effects. This medication may increase your risk of getting an infection. Call your care team for advice if you get a fever, chills, sore throat, or other symptoms of a cold or flu. Do not treat yourself. Try to avoid being around people who are sick. Avoid taking medications that contain aspirin, acetaminophen, ibuprofen, naproxen, or ketoprofen unless instructed by your care team. These medications may hide a fever. This medication may increase your risk to bruise or bleed. Call your care team if you notice any unusual bleeding. Be careful brushing or flossing your teeth or using a toothpick because you may get an infection or bleed more easily. If you have any dental work done, tell your dentist you are receiving this medication. Talk to your care team if you or your partner are pregnant or think either of you might be pregnant. This medication can cause serious birth defects if taken during pregnancy and for 6 months  after the last dose. You will need a negative pregnancy test before starting this medication. Contraception is recommended while taking this medication and for 6 months after the last dose. Your care team can help you find the option that works for you. Do not father a child while taking this medication and for 3 months after the last dose. Use a condom for contraception during this time period. Do not breastfeed while taking this medication and for 7 days after the last dose. This medication may cause infertility. Talk to your care team if you are concerned about your fertility. What side effects may I notice from receiving this medication? Side effects that you should report to your care team as soon as possible: Allergic reactions--skin rash, itching, hives, swelling  of the face, lips, tongue, or throat Dry cough, shortness of breath or trouble breathing Increased saliva or tears, increased sweating, stomach cramping, diarrhea, small pupils, unusual weakness or fatigue, slow heartbeat Infection--fever, chills, cough, sore throat, wounds that don't heal, pain or trouble when passing urine, general feeling of discomfort or being unwell Kidney injury--decrease in the amount of urine, swelling of the ankles, hands, or feet Low red blood cell level--unusual weakness or fatigue, dizziness, headache, trouble breathing Severe or prolonged diarrhea Unusual bruising or bleeding Side effects that usually do not require medical attention (report to your care team if they continue or are bothersome): Constipation Diarrhea Hair loss Loss of appetite Nausea Stomach pain This list may not describe all possible side effects. Call your doctor for medical advice about side effects. You may report side effects to FDA at 1-800-FDA-1088. Where should I keep my medication? This medication is given in a hospital or clinic. It will not be stored at home. NOTE: This sheet is a summary. It may not cover all possible  information. If you have questions about this medicine, talk to your doctor, pharmacist, or health care provider.  2023 Elsevier/Gold Standard (2022-03-30 00:00:00)   Bevacizumab Injection What is this medication? BEVACIZUMAB (be va SIZ yoo mab) treats some types of cancer. It works by blocking a protein that causes cancer cells to grow and multiply. This helps to slow or stop the spread of cancer cells. It is a monoclonal antibody. This medicine may be used for other purposes; ask your health care provider or pharmacist if you have questions. COMMON BRAND NAME(S): Alymsys, Avastin, MVASI, Noah Charon What should I tell my care team before I take this medication? They need to know if you have any of these conditions: Blood clots Coughing up blood Having or recent surgery Heart failure High blood pressure History of a connection between 2 or more body parts that do not usually connect (fistula) History of a tear in your stomach or intestines Protein in your urine An unusual or allergic reaction to bevacizumab, other medications, foods, dyes, or preservatives Pregnant or trying to get pregnant Breast-feeding How should I use this medication? This medication is injected into a vein. It is given by your care team in a hospital or clinic setting. Talk to your care team the use of this medication in children. Special care may be needed. Overdosage: If you think you have taken too much of this medicine contact a poison control center or emergency room at once. NOTE: This medicine is only for you. Do not share this medicine with others. What if I miss a dose? Keep appointments for follow-up doses. It is important not to miss your dose. Call your care team if you are unable to keep an appointment. What may interact with this medication? Interactions are not expected. This list may not describe all possible interactions. Give your health care provider a list of all the medicines, herbs,  non-prescription drugs, or dietary supplements you use. Also tell them if you smoke, drink alcohol, or use illegal drugs. Some items may interact with your medicine. What should I watch for while using this medication? Your condition will be monitored carefully while you are receiving this medication. You may need blood work while taking this medication. This medication may make you feel generally unwell. This is not uncommon as chemotherapy can affect healthy cells as well as cancer cells. Report any side effects. Continue your course of treatment even though you feel ill unless your  care team tells you to stop. This medication may increase your risk to bruise or bleed. Call your care team if you notice any unusual bleeding. Before having surgery, talk to your care team to make sure it is ok. This medication can increase the risk of poor healing of your surgical site or wound. You will need to stop this medication for 28 days before surgery. After surgery, wait at least 28 days before restarting this medication. Make sure the surgical site or wound is healed enough before restarting this medication. Talk to your care team if questions. Talk to your care team if you may be pregnant. Serious birth defects can occur if you take this medication during pregnancy and for 6 months after the last dose. Contraception is recommended while taking this medication and for 6 months after the last dose. Your care team can help you find the option that works for you. Do not breastfeed while taking this medication and for 6 months after the last dose. This medication can cause infertility. Talk to your care team if you are concerned about your fertility. What side effects may I notice from receiving this medication? Side effects that you should report to your care team as soon as possible: Allergic reactions--skin rash, itching, hives, swelling of the face, lips, tongue, or throat Bleeding--bloody or black, tar-like  stools, vomiting blood or Elzy Tomasello material that looks like coffee grounds, red or dark Clotine Heiner urine, small red or purple spots on skin, unusual bruising or bleeding Blood clot--pain, swelling, or warmth in the leg, shortness of breath, chest pain Heart attack--pain or tightness in the chest, shoulders, arms, or jaw, nausea, shortness of breath, cold or clammy skin, feeling faint or lightheaded Heart failure--shortness of breath, swelling of the ankles, feet, or hands, sudden weight gain, unusual weakness or fatigue Increase in blood pressure Infection--fever, chills, cough, sore throat, wounds that don't heal, pain or trouble when passing urine, general feeling of discomfort or being unwell Infusion reactions--chest pain, shortness of breath or trouble breathing, feeling faint or lightheaded Kidney injury--decrease in the amount of urine, swelling of the ankles, hands, or feet Stomach pain that is severe, does not go away, or gets worse Stroke--sudden numbness or weakness of the face, arm, or leg, trouble speaking, confusion, trouble walking, loss of balance or coordination, dizziness, severe headache, change in vision Sudden and severe headache, confusion, change in vision, seizures, which may be signs of posterior reversible encephalopathy syndrome (PRES) Side effects that usually do not require medical attention (report to your care team if they continue or are bothersome): Back pain Change in taste Diarrhea Dry skin Increased tears Nosebleed This list may not describe all possible side effects. Call your doctor for medical advice about side effects. You may report side effects to FDA at 1-800-FDA-1088. Where should I keep my medication? This medication is given in a hospital or clinic. It will not be stored at home. NOTE: This sheet is a summary. It may not cover all possible information. If you have questions about this medicine, talk to your doctor, pharmacist, or health care provider.   2023 Elsevier/Gold Standard (2022-04-04 00:00:00)       To help prevent nausea and vomiting after your treatment, we encourage you to take your nausea medication as directed.  BELOW ARE SYMPTOMS THAT SHOULD BE REPORTED IMMEDIATELY: *FEVER GREATER THAN 100.4 F (38 C) OR HIGHER *CHILLS OR SWEATING *NAUSEA AND VOMITING THAT IS NOT CONTROLLED WITH YOUR NAUSEA MEDICATION *UNUSUAL SHORTNESS OF BREATH *UNUSUAL  BRUISING OR BLEEDING *URINARY PROBLEMS (pain or burning when urinating, or frequent urination) *BOWEL PROBLEMS (unusual diarrhea, constipation, pain near the anus) TENDERNESS IN MOUTH AND THROAT WITH OR WITHOUT PRESENCE OF ULCERS (sore throat, sores in mouth, or a toothache) UNUSUAL RASH, SWELLING OR PAIN  UNUSUAL VAGINAL DISCHARGE OR ITCHING   Items with * indicate a potential emergency and should be followed up as soon as possible or go to the Emergency Department if any problems should occur.  Please show the CHEMOTHERAPY ALERT CARD or IMMUNOTHERAPY ALERT CARD at check-in to the Emergency Department and triage nurse.  Should you have questions after your visit or need to cancel or reschedule your appointment, please contact [facility] [phone]  and follow the prompts.  Office hours are 8:00 a.m. to 4:30 p.m. Monday - Friday. Please note that voicemails left after 4:00 p.m. may not be returned until the following business day.  We are closed weekends and major holidays. You have access to a nurse at all times for urgent questions. Please call the main number to the clinic [phone] and follow the prompts.  For any non-urgent questions, you may also contact your provider using MyChart. We now offer e-Visits for anyone 74 and older to request care online for non-urgent symptoms. For details visit [email or web address].   Also download the MyChart app! Go to the app store, search "MyChart", open the app, select [facility], and log in with your MyChart username  and password.  Masks are optional in the cancer centers. If you would like for your care team to wear a mask while they are taking care of you, please let them know. You may have one support person who is at least 43 years old accompany you for your appointments.

## 2022-08-08 NOTE — Progress Notes (Signed)
Patients port flushed without difficulty.  Good blood return noted with no bruising or swelling noted at site.  Patient remains accessed for chemotherapy treatment.  

## 2022-08-08 NOTE — Progress Notes (Signed)
Patient presents today for chemotherapy infusion.  Patient is in satisfactory condition with no complaints voiced.  Vital signs are stable.  Labs reviewed by Dr. Delton Coombes during her office visit.  All labs are within treatment parameters.  We will proceed with treatment per MD orders.   Patient tolerated treatment well with no complaints voiced. Home infusion 5FU pump connected.  Patient expressed no concerns or questions.  Patient left ambulatory in stable condition.  Vital signs stable at discharge.  Follow up as scheduled.

## 2022-08-09 LAB — CEA: CEA: 3.7 ng/mL (ref 0.0–4.7)

## 2022-08-10 ENCOUNTER — Inpatient Hospital Stay: Payer: BC Managed Care – PPO

## 2022-08-10 ENCOUNTER — Other Ambulatory Visit: Payer: Self-pay

## 2022-08-10 VITALS — BP 117/64 | HR 89 | Temp 98.1°F | Resp 18

## 2022-08-10 DIAGNOSIS — C20 Malignant neoplasm of rectum: Secondary | ICD-10-CM

## 2022-08-10 DIAGNOSIS — Z95828 Presence of other vascular implants and grafts: Secondary | ICD-10-CM

## 2022-08-10 DIAGNOSIS — Z5111 Encounter for antineoplastic chemotherapy: Secondary | ICD-10-CM | POA: Diagnosis not present

## 2022-08-10 MED ORDER — SODIUM CHLORIDE 0.9% FLUSH
10.0000 mL | INTRAVENOUS | Status: DC | PRN
  Administered 2022-08-10: 10 mL

## 2022-08-10 MED ORDER — HEPARIN SOD (PORK) LOCK FLUSH 100 UNIT/ML IV SOLN
500.0000 [IU] | Freq: Once | INTRAVENOUS | Status: AC | PRN
  Administered 2022-08-10: 500 [IU]

## 2022-08-10 NOTE — Patient Instructions (Signed)
[facility]  Discharge Instructions: Thank you for choosing [facility] to provide your oncology and hematology care.  If you have a lab appointment with the [facility], please come in thru the Main Entrance and check in at the main information desk.  Wear comfortable clothing and clothing appropriate for easy access to any Portacath or PICC line.   We strive to give you quality time with your provider. You may need to reschedule your appointment if you arrive late (15 or more minutes).  Arriving late affects you and other patients whose appointments are after yours.  Also, if you miss three or more appointments without notifying the office, you may be dismissed from the clinic at the provider's discretion.      For prescription refill requests, have your pharmacy contact our office and allow 72 hours for refills to be completed.    Today you received the following chemo pump removed and port flushed.   To help prevent nausea and vomiting after your treatment, we encourage you to take your nausea medication as directed.  BELOW ARE SYMPTOMS THAT SHOULD BE REPORTED IMMEDIATELY: *FEVER GREATER THAN 100.4 F (38 C) OR HIGHER *CHILLS OR SWEATING *NAUSEA AND VOMITING THAT IS NOT CONTROLLED WITH YOUR NAUSEA MEDICATION *UNUSUAL SHORTNESS OF BREATH *UNUSUAL BRUISING OR BLEEDING *URINARY PROBLEMS (pain or burning when urinating, or frequent urination) *BOWEL PROBLEMS (unusual diarrhea, constipation, pain near the anus) TENDERNESS IN MOUTH AND THROAT WITH OR WITHOUT PRESENCE OF ULCERS (sore throat, sores in mouth, or a toothache) UNUSUAL RASH, SWELLING OR PAIN  UNUSUAL VAGINAL DISCHARGE OR ITCHING   Items with * indicate a potential emergency and should be followed up as soon as possible or go to the Emergency Department if any problems should occur.  Please show the CHEMOTHERAPY ALERT CARD or IMMUNOTHERAPY ALERT CARD at check-in to the Emergency Department and  triage nurse.  Should you have questions after your visit or need to cancel or reschedule your appointment, please contact [facility] [phone]  and follow the prompts.  Office hours are 8:00 a.m. to 4:30 p.m. Monday - Friday. Please note that voicemails left after 4:00 p.m. may not be returned until the following business day.  We are closed weekends and major holidays. You have access to a nurse at all times for urgent questions. Please call the main number to the clinic [phone] and follow the prompts.  For any non-urgent questions, you may also contact your provider using MyChart. We now offer e-Visits for anyone 20 and older to request care online for non-urgent symptoms. For details visit [email or web address].   Also download the MyChart app! Go to the app store, search "MyChart", open the app, select [facility], and log in with your MyChart username and password.  Masks are optional in the cancer centers. If you would like for your care team to wear a mask while they are taking care of you, please let them know. You may have one support person who is at least 43 years old accompany you for your appointments.

## 2022-08-10 NOTE — Progress Notes (Signed)
Patient presents today for pump d/c.  Port flushed with good blood return noted. No bruising or swelling at site. Bandaid applied and patient discharged in satisfactory condition. VVS stable with no signs or symptoms of distressed noted.  

## 2022-08-14 ENCOUNTER — Ambulatory Visit (HOSPITAL_COMMUNITY)
Admission: RE | Admit: 2022-08-14 | Discharge: 2022-08-14 | Disposition: A | Discharge status: Home / Self Care | Payer: BC Managed Care – PPO | Source: Ambulatory Visit | Attending: Hematology | Admitting: Hematology

## 2022-08-14 DIAGNOSIS — C20 Malignant neoplasm of rectum: Secondary | ICD-10-CM | POA: Diagnosis present

## 2022-08-14 MED ORDER — IOHEXOL 300 MG/ML  SOLN
100.0000 mL | Freq: Once | INTRAMUSCULAR | Status: AC | PRN
  Administered 2022-08-14: 100 mL via INTRAVENOUS

## 2022-08-17 ENCOUNTER — Encounter: Payer: Self-pay | Admitting: Licensed Clinical Social Worker

## 2022-08-17 ENCOUNTER — Ambulatory Visit (HOSPITAL_BASED_OUTPATIENT_CLINIC_OR_DEPARTMENT_OTHER): Payer: BC Managed Care – PPO | Admitting: Licensed Clinical Social Worker

## 2022-08-17 DIAGNOSIS — Z800 Family history of malignant neoplasm of digestive organs: Secondary | ICD-10-CM | POA: Diagnosis not present

## 2022-08-17 DIAGNOSIS — Z803 Family history of malignant neoplasm of breast: Secondary | ICD-10-CM

## 2022-08-17 DIAGNOSIS — C20 Malignant neoplasm of rectum: Secondary | ICD-10-CM | POA: Diagnosis not present

## 2022-08-17 DIAGNOSIS — Z806 Family history of leukemia: Secondary | ICD-10-CM

## 2022-08-17 NOTE — Progress Notes (Signed)
REFERRING PROVIDER: Derek Jack, MD [place],  [place] [address]  PRIMARY PROVIDER:  Sharion Balloon, FNP  PRIMARY REASON FOR VISIT:  1. Rectal cancer ([facility])   2. Family history of breast cancer   3. Family history of colon cancer    I connected with Ms. Taft on 08/17/2022 at 1:00 PM EDT by MyChart video conference and verified that I am speaking with the correct person using two identifiers.    Patient location: home Provider location: [facility]:   Ms. Ang, a 41 y.o. female, was seen for a [facility] cancer genetics consultation at the request of Dr. Delton Coombes due to a personal and family history of cancer.  Ms. Paulson presents to clinic today to discuss the possibility of a hereditary predisposition to cancer, genetic testing, and to further clarify her future cancer risks, as well as potential cancer risks for family members.   In 2023, at the age of 89, Ms. Biller was diagnosed with rectal cancer, MS-Stable. Patient is currently being treated with chemotherapy.   CANCER HISTORY:  Oncology History  Rectal cancer ([facility])  03/28/2022 Initial Diagnosis   Rectal cancer ([facility])   05/29/2022 - 07/26/2022 Chemotherapy   Patient is on Treatment Plan : COLORECTAL FOLFOXIRI + Bevacizumab q14d     05/29/2022 -  Chemotherapy   Patient is on Treatment Plan : COLORECTAL FOLFOXIRI + Bevacizumab q14d      RISK FACTORS:  Menarche was at age 64.  First live birth at age 70.  OCP use: yes Ovaries intact: yes.  Hysterectomy: no.  Mammogram within the last year: no. Number of breast biopsies: 0. Up to date with pelvic exams: yes. Past Medical History:  Diagnosis Date   Cancer [facility])    Depression, major, single episode, mild ([place])    Port-A-Cath in place 05/24/2022    Past Surgical History:  Procedure Laterality Date   BIOPSY  03/21/2022   Procedure: BIOPSY;  Surgeon: [name], DO;  Location[facility];  Service:  Endoscopy;;   COLONOSCOPY WITH PROPOFOL N/A 03/21/2022   Procedure: COLONOSCOPY WITH PROPOFOL;  Surgeon: [name], DO;  Location: [facility];  Service: Endoscopy;  Laterality: N/A;  8:30am   FLEXIBLE SIGMOIDOSCOPY N/A 05/19/2022   Procedure: FLEXIBLE SIGMOIDOSCOPY WITH TATTOO INJECTION;  Surgeon: Leighton Ruff, MD;  Location: [facility] ORS;  Service: General;  Laterality: N/A;   LAPAROSCOPIC DIVERTED COLOSTOMY N/A 05/19/2022   Procedure: LAPAROSCOPIC DIVERTED OSTOMY;  Surgeon: Leighton Ruff, MD;  Location: [facility] ORS;  Service: General;  Laterality: N/A;   PORTACATH PLACEMENT Right 05/19/2022   Procedure: INSERTION PORT-A-CATH;  Surgeon: Leighton Ruff, MD;  Location: [facility] ORS;  Service: General;  Laterality: Right;    FAMILY HISTORY:  We obtained a detailed, 4-generation family history.  Significant diagnoses are listed below: Family History  Problem Relation Age of Onset   Heart disease Mother    Diabetes Mother    Hearing loss Father    Heart disease Father    Hyperlipidemia Father    Hypertension Father    Stroke Father    Arthritis Father    Diabetes Father    Learning disabilities Brother    Leukemia Maternal Uncle        d. 16s   Colon cancer Paternal Aunt        dx 76s   Diabetes Maternal Grandmother    Heart disease Maternal Grandmother    Diabetes Maternal Grandfather  Diabetes Paternal Grandmother    Diabetes Paternal Grandfather    Breast cancer Cousin 51   Ms. Crownover has 1 son, 70, 1 daughter, 41. She has 4 sisters and 1 brother. One of her sisters had a colorectal tumor removed that 6 years ago that patient believes was benign.   Ms. Romanello mother is living at 40. A maternal uncle passed of leukemia in his 50s. Maternal cousin passed of breast cancer at 90, her brother passed of leukemia at age 55. Maternal grandmother's siblings had cancers, patient believes skin and nose cancer and an unknown cancer.  Ms. Croslin father passed at 14. Patient had 1 paternal  aunt that had colon cancer in her 58s and passed in her 31s of a recurrence. No other known cancers on this side of the family.  Ms. Torain is unaware of previous family history of genetic testing for hereditary cancer risks. There is no reported Ashkenazi Jewish ancestry. There is no known consanguinity.    GENETIC COUNSELING ASSESSMENT: Ms. Fogarty is a 41 y.o. female with a personal history of rectal cancer which is somewhat suggestive of a hereditary cancer syndrome and predisposition to cancer. We, therefore, discussed and recommended the following at today's visit.   DISCUSSION: We discussed that approximately 10% of colorectal cancer is hereditary. Most cases of hereditary colorectal cancer are associated with Lynch syndrome genes, although there are other genes associated with hereditary cancer as well. Cancers and risks are gene specific. We discussed that testing is beneficial for several reasons including knowing about cancer risks, identifying potential screening and risk-reduction options that may be appropriate, and to understand if other family members could be at risk for cancer and allow them to undergo genetic testing.   We reviewed the characteristics, features and inheritance patterns of hereditary cancer syndromes. We also discussed genetic testing, including the appropriate family members to test, the process of testing, insurance coverage and turn-around-time for results. We discussed the implications of a negative, positive and/or variant of uncertain significant result. We recommended Ms. Gilmer pursue genetic testing for the [facility] CustomNext+RNA gene panel.   Based on Ms. Wellen's personal and family history of cancer, she meets medical criteria for genetic testing. Despite that she meets criteria, she may still have an out of pocket cost. We discussed that if her out of pocket cost for testing is over $100, the laboratory will call and confirm whether she wants to proceed with  testing.  If the out of pocket cost of testing is less than $100 she will be billed by the genetic testing laboratory.   PLAN: After considering the risks, benefits, and limitations, Ms. Jeffries provided informed consent to pursue genetic testing and the blood sample was sent to [facility] for analysis of the CustomNext+RNA panel. She will have blood drawn on 9/18 when she is at [facility]. Results should be available within approximately 2-3 weeks' time, at which point they will be disclosed by telephone to Ms. Mimbs, as will any additional recommendations warranted by these results. Ms. Daddona will receive a summary of her genetic counseling visit and a copy of her results once available. This information will also be available in Epic.   Ms. January questions were answered to her satisfaction today. Our contact information was provided should additional questions or concerns arise. Thank you for the referral and allowing [place] to share in the care of your patient.   Faith Rogue, MS, [facility] Genetic Counselor [email or web address] Phone: [phone]  The patient was seen  for a total of 25 minutes in virtual genetic counseling.  Dr. Grayland Ormond was available for discussion regarding this case.    _______________________________________________________________________ For Office Staff:  Number of people involved in session: 1 Was an Intern/ student involved with case: no

## 2022-08-20 NOTE — Progress Notes (Unsigned)
[place], 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  PRIOR THERAPY: none  NGS Results: not done  CURRENT THERAPY: FOLFOXIRI + Bevacizumab q14d  BRIEF ONCOLOGIC HISTORY:  Oncology History  Rectal cancer ([place])  03/28/2022 Initial Diagnosis   Rectal cancer ([facility])   05/29/2022 - 07/26/2022 Chemotherapy   Patient is on Treatment Plan : COLORECTAL FOLFOXIRI + Bevacizumab q14d     05/29/2022 -  Chemotherapy   Patient is on Treatment Plan : COLORECTAL FOLFOXIRI + Bevacizumab q14d       CANCER STAGING:  Cancer Staging  Rectal cancer ([place]) Staging form: Colon and Rectum, AJCC 8th Edition - Clinical stage from 03/28/2022: Stage IVA (cTX, cN1, cM1a) - Unsigned   INTERVAL HISTORY:  [name], a 41 y.o. female, returns for follow-up and toxicity assessment prior to  Cycle 7, Day 1 of FOLFOX/Bev. ***   REVIEW OF SYSTEMS:  Review of Systems  Constitutional:  Negative for appetite change, fatigue and unexpected weight change.  HENT:   Negative for nosebleeds.   Cardiovascular:  Negative for leg swelling.  Gastrointestinal:  Negative for abdominal pain, blood in stool and diarrhea.  Genitourinary:  Negative for hematuria.   Neurological:  Negative for numbness.  Psychiatric/Behavioral:  Negative for sleep disturbance.   All other systems reviewed and are negative.   PAST MEDICAL/SURGICAL HISTORY:  Past Medical History:  Diagnosis Date   Cancer [facility])    Depression, major, single episode, mild ([place])    Port-A-Cath in place 05/24/2022   Past Surgical History:  Procedure Laterality Date   BIOPSY  03/21/2022   Procedure: BIOPSY;  Surgeon: [name], DO;  Location[facility];  Service: Endoscopy;;   COLONOSCOPY WITH PROPOFOL N/A 03/21/2022    Procedure: COLONOSCOPY WITH PROPOFOL;  Surgeon: [name], DO;  Location: [facility];  Service: Endoscopy;  Laterality: N/A;  8:30am   FLEXIBLE SIGMOIDOSCOPY N/A 05/19/2022   Procedure: FLEXIBLE SIGMOIDOSCOPY WITH TATTOO INJECTION;  Surgeon: Leighton Ruff, MD;  Location: [facility] ORS;  Service: General;  Laterality: N/A;   LAPAROSCOPIC DIVERTED COLOSTOMY N/A 05/19/2022   Procedure: LAPAROSCOPIC DIVERTED OSTOMY;  Surgeon: Leighton Ruff, MD;  Location: [facility] ORS;  Service: General;  Laterality: N/A;   PORTACATH PLACEMENT Right 05/19/2022   Procedure: INSERTION PORT-A-CATH;  Surgeon: Leighton Ruff, MD;  Location: [facility] ORS;  Service: General;  Laterality: Right;    SOCIAL HISTORY:  Social History   Socioeconomic History   Marital status: Married    Spouse name: Not on file   Number of children: Not on file   Years of education: Not on file   Highest education level: Not on file  Occupational History   Not on file  Tobacco Use   Smoking status: Every Day    Packs/day: 2.00    Years: 27.00    Total pack years: 54.00    Types: Cigarettes   Smokeless tobacco: Never  Vaping Use   Vaping Use: Never used  Substance and Sexual Activity   Alcohol use: No   Drug use: No   Sexual activity: Yes    Birth control/protection: Injection  Other Topics Concern   Not on file  Social History Narrative   Not on file   Social Determinants of Radio broadcast assistant  Strain: Not on file  Food Insecurity: Not on file  Transportation Needs: Not on file  Physical Activity: Not on file  Stress: Not on file  Social Connections: Not on file  Intimate Partner Violence: Not on file    FAMILY HISTORY:  Family History  Problem Relation Age of Onset   Heart disease Mother    Diabetes Mother    Hearing loss Father    Heart disease Father    Hyperlipidemia Father    Hypertension Father    Stroke Father    Arthritis Father    Diabetes Father    Learning disabilities Brother    Leukemia  Maternal Uncle        d. 8s   Colon cancer Paternal Aunt        dx 27s   Diabetes Maternal Grandmother    Heart disease Maternal Grandmother    Diabetes Maternal Grandfather    Diabetes Paternal Grandmother    Diabetes Paternal Grandfather    Breast cancer Cousin 62    CURRENT MEDICATIONS:  Current Outpatient Medications  Medication Sig Dispense Refill   Bevacizumab (AVASTIN IV) Inject into the vein every 14 (fourteen) days.     Calcium-Cholecalciferol-Zinc 650-20-5.5 MG-MCG-MG CHEW Chew by mouth.     cetirizine (ZYRTEC) 10 MG tablet Take 10 mg by mouth daily.     famotidine (PEPCID) 20 MG tablet Take 1 tablet (20 mg total) by mouth 2 (two) times daily for 14 days. 28 tablet 0   fluorouracil CALGB 46503 2,400 mg/m2 in sodium chloride 0.9 % 150 mL Inject 2,400 mg/m2 into the vein over 48 hr.     FLUOROURACIL IV Inject into the vein every 14 (fourteen) days.     gabapentin (NEURONTIN) 300 MG capsule Take 1 capsule (300 mg total) by mouth 3 (three) times daily. (Patient not taking: Reported on 08/08/2022) 30 capsule 3   HYDROcodone-acetaminophen (NORCO) 5-325 MG tablet Take 1 tablet by mouth every 8 (eight) hours as needed for moderate pain. (Patient not taking: Reported on 08/08/2022) 90 tablet 0   ibuprofen (ADVIL) 200 MG tablet Take 1,000 mg by mouth every 6 (six) hours as needed for moderate pain.     IRINOTECAN HCL IV Inject into the vein every 14 (fourteen) days.     LEUCOVORIN CALCIUM IV Inject into the vein every 14 (fourteen) days.     medroxyPROGESTERone (DEPO-PROVERA) 150 MG/ML injection INJECT 1 ML (150 MG TOTAL) INTO THE MUSCLE EVERY 3 (THREE) MONTHS 1 mL 0   Multiple Vitamin (MULTIVITAMIN) tablet Take 1 tablet by mouth daily.     OXALIPLATIN IV Inject into the vein every 14 (fourteen) days.     predniSONE (DELTASONE) 20 MG tablet 2 po at sametime daily for 5 days- start tomorrow 10 tablet 0   No current facility-administered medications for this visit.    ALLERGIES:   Allergies  Allergen Reactions   Wellbutrin [Bupropion]     insomnia   Tramadol Rash    PHYSICAL EXAM:  Performance status (ECOG): 0 - Asymptomatic  There were no vitals filed for this visit. Wt Readings from Last 3 Encounters:  08/08/22 141 lb 12.8 oz (64.3 kg)  07/24/22 137 lb 6.4 oz (62.3 kg)  07/10/22 137 lb 5.6 oz (62.3 kg)   Physical Exam Vitals reviewed.  Constitutional:      Appearance: Normal appearance.  Cardiovascular:     Rate and Rhythm: Normal rate and regular rhythm.     Pulses: Normal pulses.  Heart sounds: Normal heart sounds.  Pulmonary:     Effort: Pulmonary effort is normal.     Breath sounds: Normal breath sounds.  Neurological:     General: No focal deficit present.     Mental Status: She is alert and oriented to person, place, and time.  Psychiatric:        Mood and Affect: Mood normal.        Behavior: Behavior normal.     LABORATORY DATA:  I have reviewed the labs as listed.     Latest Ref Rng & Units 08/08/2022    8:52 AM 07/24/2022    8:43 AM 07/10/2022    9:44 AM  CBC  WBC 4.0 - 10.5 K/uL 4.5  4.8  6.1   Hemoglobin 12.0 - 15.0 g/dL 13.1  12.2  12.1   Hematocrit 36.0 - 46.0 % 38.9  36.3  36.4   Platelets 150 - 400 K/uL 217  195  207       Latest Ref Rng & Units 08/08/2022    8:52 AM 07/24/2022    8:43 AM 07/10/2022    9:44 AM  CMP  Glucose 70 - 99 mg/dL 105  99  128   BUN 6 - 20 mg/dL _0 Creatinine 0.44 - 1.00 mg/dL 0.64  0.51  0.65   Sodium 135 - 145 mmol/L 139  139  137   Potassium 3.5 - 5.1 mmol/L 4.3  4.1  4.1   Chloride 98 - 111 mmol/L 109  108  109   CO2 22 - 32 mmol/L _1 Calcium 8.9 - 10.3 mg/dL 9.1  9.0  9.4   Total Protein 6.5 - 8.1 g/dL 6.5  6.4  6.6   Total Bilirubin 0.3 - 1.2 mg/dL 0.5  0.5  0.5   Alkaline Phos 38 - 126 U/L 63  52  57   AST 15 - 41 U/L _2 ALT 0 - 44 U/L 25  21  32     DIAGNOSTIC IMAGING:  I have independently reviewed the scans and discussed with the patient. CT  Abdomen Pelvis W Contrast  Result Date: 08/14/2022 CLINICAL DATA:  Rectal cancer with ongoing chemotherapy. * Tracking Code: BO * EXAM: CT ABDOMEN AND PELVIS WITH CONTRAST TECHNIQUE: Multidetector CT imaging of the abdomen and pelvis was performed using the standard protocol following bolus administration of intravenous contrast. RADIATION DOSE REDUCTION: This exam was performed according to the departmental dose-optimization program which includes automated exposure control, adjustment of the mA and/or kV according to patient size and/or use of iterative reconstruction technique. CONTRAST:  146m OMNIPAQUE IOHEXOL 300 MG/ML  SOLN COMPARISON:  MR abdomen 04/01/2022, PET 03/23/2022, CT abdomen pelvis 03/08/2022. FINDINGS: Lower chest: Lung bases are clear. Heart size normal. No pericardial or pleural effusion. Distal esophagus is unremarkable. Hepatobiliary: Liver is enlarged, 19.8 cm. Minimally hyperattenuating central right hepatic lobe mass measures 3.3 x 3.3 cm, stable from 04/01/2022. Additional scattered subcentimeter metastatic lesions, similar and better evaluated on 04/01/2022. Liver and gallbladder are otherwise unremarkable. No biliary ductal dilatation. Pancreas: Negative. Spleen: Negative. Adrenals/Urinary Tract: Adrenal glands and kidneys are unremarkable. Ureters are decompressed. Bladder is very low in volume. Stomach/Bowel: Stomach, small bowel, appendix and majority of the colon are unremarkable. Left lower quadrant diverting colostomy. Residual segmental irregular mucosal hyperattenuation involving the rectum, with slight wall thickening. A discrete mass is difficult to measure although subjectively, there does  appear to be less wall thickening than on 03/08/2022. Vascular/Lymphatic: Tiny left perirectal lymph node measures 4 mm (2/74), previously 7 mm. Abdominal retroperitoneal lymph nodes remain subcentimeter in size, measuring up to 8 mm in the left periaortic station (2/25), adjacent to the  left adrenal gland. Reproductive: Uterus is visualized.  No adnexal mass. Other: Presacral edema/thickening. No free fluid. Mesenteries and peritoneum are unremarkable. Musculoskeletal: Degenerative changes in the spine. No worrisome lytic or sclerotic lesions. IMPRESSION: 1. Interval left lower quadrant diverting colostomy. Residual irregular mucosal hyperattenuation and slight wall thickening involving the rectum, overall subjectively improved slightly from 03/08/2022. A discrete mass is difficult to measure. Improving perirectal adenopathy. 2. Numerous small right hepatic lobe lesions, grossly stable and hydro present metastases on 04/01/2022. 3. Minimally hyperdense central right hepatic lobe mass, better evaluated on MR abdomen 04/01/2022. 4. Hepatomegaly. Electronically Signed   By: Lorin Picket M.D.   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  Stools became bloody/mucousy - She also reported pain in the tailbone region since March 2023.  She also has right-sided lower back pain. - 50 pound weight loss in the last 9 months, part of weight loss was intentional.  She cut back on eating sweets and lost taste to sweets after COVID infection. - CT [facility] with contrast on 03/08/2022: Irregular circumferential masslike wall thickening of sigmoid colon/rectum with adjacent perirectal adenopathy.  Multiple small hypodense lesions in the liver, largest 2.9 cm in the central aspect of the liver, question metastatic disease. - Colonoscopy on 03/21/2022 by Dr. Abbey Chatters: Fungating infiltrative nearly completely obstructing mass in the rectosigmoid colon, mass was circumferential measuring 4 cm in length. - Pathology: Rectal mass biopsy consistent with invasive moderately differentiated adenocarcinoma.  As there is very scant invasive tumor, MSI studies were deferred. - PET scan (03/23/2022):  Hypermetabolic rectal primary long-segment with SUV 14.3.  Left posterior perirectal lymph node 7 mm with SUV 2.9.  Multiple tiny foci of hepatic hypermetabolism. - MRI of the liver (04/01/2022): Multiple small hypovascular rim-enhancing liver lesions, predominantly in the right hepatic lobe measuring up to 1.1 cm.  3.3 cm hypervascular mass in the central liver, most consistent with FNH/hepatic adenoma. - Liver biopsy (04/20/2022): Metastatic moderately differentiated colonic adenocarcinoma with mucinous features - NGS testing shows K-ras G12 R mutation, PIK3CA exon 21 mutation.  MS-stable.  TMB-low. - Cycle 1 of FOLFOXIRI on 05/29/2022, bevacizumab added during cycle 2    Social/family history: - She is separated and is seen today with her daughter.  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  She is current active smoker, 1 pack/day for 27 years.  Denies drinking alcohol. - Paternal aunt had colon cancer.  Maternal cousin has breast cancer.  Maternal uncle had leukemia and maternal cousin had leukemia.   PLAN:  Metastatic rectal cancer to the liver: Due for Cycle 7, Day 1 of FOLFOX/Bev today.  ***reach out to Dr. Marcello Moores about the optimal presurgical plan and whether to include radiation.  2.  Sacral/right lower back pain: - Pain has completely improved since cycle 5.  Not requiring hydrocodone and gabapentin or ibuprofen.  3.  Peripheral neuropathy: - Cold sensitivity lasted 7 days after last treatment.  Tingling in the toes has completely resolved after dose of oxaliplatin was cut back.   Orders placed this encounter:  No orders of the defined types were placed in  this encounter.

## 2022-08-21 ENCOUNTER — Inpatient Hospital Stay: Payer: BC Managed Care – PPO | Attending: Hematology

## 2022-08-21 ENCOUNTER — Inpatient Hospital Stay: Payer: BC Managed Care – PPO | Admitting: Physician Assistant

## 2022-08-21 DIAGNOSIS — Z5111 Encounter for antineoplastic chemotherapy: Secondary | ICD-10-CM | POA: Diagnosis present

## 2022-08-21 DIAGNOSIS — M545 Low back pain, unspecified: Secondary | ICD-10-CM | POA: Insufficient documentation

## 2022-08-21 DIAGNOSIS — F17210 Nicotine dependence, cigarettes, uncomplicated: Secondary | ICD-10-CM | POA: Insufficient documentation

## 2022-08-21 DIAGNOSIS — C20 Malignant neoplasm of rectum: Secondary | ICD-10-CM | POA: Diagnosis not present

## 2022-08-21 DIAGNOSIS — C787 Secondary malignant neoplasm of liver and intrahepatic bile duct: Secondary | ICD-10-CM | POA: Insufficient documentation

## 2022-08-21 DIAGNOSIS — G629 Polyneuropathy, unspecified: Secondary | ICD-10-CM | POA: Diagnosis not present

## 2022-08-21 DIAGNOSIS — Z803 Family history of malignant neoplasm of breast: Secondary | ICD-10-CM | POA: Insufficient documentation

## 2022-08-21 DIAGNOSIS — Z95828 Presence of other vascular implants and grafts: Secondary | ICD-10-CM

## 2022-08-21 DIAGNOSIS — Z806 Family history of leukemia: Secondary | ICD-10-CM | POA: Insufficient documentation

## 2022-08-21 DIAGNOSIS — Z800 Family history of malignant neoplasm of digestive organs: Secondary | ICD-10-CM | POA: Insufficient documentation

## 2022-08-21 LAB — COMPREHENSIVE METABOLIC PANEL
ALT: 19 U/L (ref 0–44)
AST: 15 U/L (ref 15–41)
Albumin: 3.5 g/dL (ref 3.5–5.0)
Alkaline Phosphatase: 62 U/L (ref 38–126)
Anion gap: 5 (ref 5–15)
BUN: 11 mg/dL (ref 6–20)
CO2: 24 mmol/L (ref 22–32)
Calcium: 8.7 mg/dL — ABNORMAL LOW (ref 8.9–10.3)
Chloride: 108 mmol/L (ref 98–111)
Creatinine, Ser: 0.6 mg/dL (ref 0.44–1.00)
GFR, Estimated: >60 mL/min (ref >60)
Glucose, Bld: 104 mg/dL — ABNORMAL HIGH (ref 70–99)
Potassium: 4 mmol/L (ref 3.5–5.1)
Sodium: 137 mmol/L (ref 135–145)
Total Bilirubin: 0.6 mg/dL (ref 0.3–1.2)
Total Protein: 6.4 g/dL — ABNORMAL LOW (ref 6.5–8.1)

## 2022-08-21 LAB — CBC WITH DIFFERENTIAL/PLATELET
Abs Immature Granulocytes: 0.02 10*3/uL (ref 0.00–0.07)
Basophils Absolute: 0 10*3/uL (ref 0.0–0.1)
Basophils Relative: 1 %
Eosinophils Absolute: 0.1 10*3/uL (ref 0.0–0.5)
Eosinophils Relative: 2 %
HCT: 38.1 % (ref 36.0–46.0)
Hemoglobin: 12.8 g/dL (ref 12.0–15.0)
Immature Granulocytes: 0 %
Lymphocytes Relative: 36 %
Lymphs Abs: 1.9 10*3/uL (ref 0.7–4.0)
MCH: 33.2 pg (ref 26.0–34.0)
MCHC: 33.6 g/dL (ref 30.0–36.0)
MCV: 99 fL (ref 80.0–100.0)
Monocytes Absolute: 0.4 10*3/uL (ref 0.1–1.0)
Monocytes Relative: 8 %
Neutro Abs: 2.8 10*3/uL (ref 1.7–7.7)
Neutrophils Relative %: 53 %
Platelets: 192 10*3/uL (ref 150–400)
RBC: 3.85 MIL/uL — ABNORMAL LOW (ref 3.87–5.11)
RDW: 15.9 % — ABNORMAL HIGH (ref 11.5–15.5)
WBC: 5.2 10*3/uL (ref 4.0–10.5)
nRBC: 0 % (ref 0.0–0.2)

## 2022-08-21 LAB — URINALYSIS, DIPSTICK ONLY
Bilirubin Urine: NEGATIVE
Glucose, UA: NEGATIVE mg/dL
Hgb urine dipstick: NEGATIVE
Ketones, ur: NEGATIVE mg/dL
Leukocytes,Ua: NEGATIVE
Nitrite: NEGATIVE
Protein, ur: NEGATIVE mg/dL
Specific Gravity, Urine: 1.02 (ref 1.005–1.030)
pH: 5 (ref 5.0–8.0)

## 2022-08-21 MED ORDER — SODIUM CHLORIDE 0.9% FLUSH
10.0000 mL | INTRAVENOUS | Status: DC | PRN
  Administered 2022-08-21: 10 mL via INTRAVENOUS

## 2022-08-21 MED ORDER — HEPARIN SOD (PORK) LOCK FLUSH 100 UNIT/ML IV SOLN
500.0000 [IU] | Freq: Once | INTRAVENOUS | Status: AC
  Administered 2022-08-21: 500 [IU] via INTRAVENOUS

## 2022-08-22 ENCOUNTER — Inpatient Hospital Stay: Payer: BC Managed Care – PPO

## 2022-08-22 VITALS — BP 107/65 | HR 62 | Temp 96.6°F | Resp 18 | Ht 68.0 in | Wt 143.2 lb

## 2022-08-22 DIAGNOSIS — Z5111 Encounter for antineoplastic chemotherapy: Secondary | ICD-10-CM | POA: Diagnosis not present

## 2022-08-22 DIAGNOSIS — Z95828 Presence of other vascular implants and grafts: Secondary | ICD-10-CM

## 2022-08-22 DIAGNOSIS — C20 Malignant neoplasm of rectum: Secondary | ICD-10-CM

## 2022-08-22 MED ORDER — PALONOSETRON HCL INJECTION 0.25 MG/5ML
0.2500 mg | Freq: Once | INTRAVENOUS | Status: AC
  Administered 2022-08-22: 0.25 mg via INTRAVENOUS
  Filled 2022-08-22: qty 5

## 2022-08-22 MED ORDER — SODIUM CHLORIDE 0.9 % IV SOLN: Freq: Once | INTRAVENOUS | Status: AC

## 2022-08-22 MED ORDER — SODIUM CHLORIDE 0.9 % IV SOLN
2400.0000 mg/m2 | INTRAVENOUS | Status: DC
  Administered 2022-08-22: 4150 mg via INTRAVENOUS
  Filled 2022-08-22: qty 83

## 2022-08-22 MED ORDER — LEUCOVORIN CALCIUM INJECTION 350 MG
400.0000 mg/m2 | Freq: Once | INTRAVENOUS | Status: AC
  Administered 2022-08-22: 692 mg via INTRAVENOUS
  Filled 2022-08-22: qty 34.6

## 2022-08-22 MED ORDER — SODIUM CHLORIDE 0.9 % IV SOLN
165.0000 mg/m2 | Freq: Once | INTRAVENOUS | Status: AC
  Administered 2022-08-22: 280 mg via INTRAVENOUS
  Filled 2022-08-22: qty 4

## 2022-08-22 MED ORDER — DEXTROSE 5 % IV SOLN: Freq: Once | INTRAVENOUS | Status: AC

## 2022-08-22 MED ORDER — SODIUM CHLORIDE 0.9% FLUSH: 10.0000 mL | INTRAVENOUS | Status: DC | PRN

## 2022-08-22 MED ORDER — SODIUM CHLORIDE 0.9 % IV SOLN
150.0000 mg | Freq: Once | INTRAVENOUS | Status: AC
  Administered 2022-08-22: 150 mg via INTRAVENOUS
  Filled 2022-08-22: qty 150

## 2022-08-22 MED ORDER — OXALIPLATIN CHEMO INJECTION 100 MG/20ML
75.0000 mg/m2 | Freq: Once | INTRAVENOUS | Status: AC
  Administered 2022-08-22: 130 mg via INTRAVENOUS
  Filled 2022-08-22: qty 20

## 2022-08-22 MED ORDER — HEPARIN SOD (PORK) LOCK FLUSH 100 UNIT/ML IV SOLN: 500.0000 [IU] | Freq: Once | INTRAVENOUS | Status: DC | PRN

## 2022-08-22 MED ORDER — ATROPINE SULFATE 1 MG/ML IV SOLN
0.5000 mg | Freq: Once | INTRAVENOUS | Status: AC
  Administered 2022-08-22: 0.5 mg via INTRAVENOUS
  Filled 2022-08-22: qty 1

## 2022-08-22 MED ORDER — SODIUM CHLORIDE 0.9 % IV SOLN
5.0000 mg/kg | Freq: Once | INTRAVENOUS | Status: AC
  Administered 2022-08-22: 300 mg via INTRAVENOUS
  Filled 2022-08-22: qty 12

## 2022-08-22 MED ORDER — SODIUM CHLORIDE 0.9 % IV SOLN
10.0000 mg | Freq: Once | INTRAVENOUS | Status: AC
  Administered 2022-08-22: 10 mg via INTRAVENOUS
  Filled 2022-08-22: qty 10

## 2022-08-22 NOTE — Patient Instructions (Signed)
[facility]  Discharge Instructions: Thank you for choosing [facility] to provide your oncology and hematology care.  If you have a lab appointment with the [facility], please come in thru the Main Entrance and check in at the main information desk.  Wear comfortable clothing and clothing appropriate for easy access to any Portacath or PICC line.   We strive to give you quality time with your provider. You may need to reschedule your appointment if you arrive late (15 or more minutes).  Arriving late affects you and other patients whose appointments are after yours.  Also, if you miss three or more appointments without notifying the office, you may be dismissed from the clinic at the provider's discretion.      For prescription refill requests, have your pharmacy contact our office and allow 72 hours for refills to be completed.    Today you received the following chemotherapy and/or immunotherapy agents Zirabev FOLFOXIRI      To help prevent nausea and vomiting after your treatment, we encourage you to take your nausea medication as directed.  BELOW ARE SYMPTOMS THAT SHOULD BE REPORTED IMMEDIATELY: *FEVER GREATER THAN 100.4 F (38 C) OR HIGHER *CHILLS OR SWEATING *NAUSEA AND VOMITING THAT IS NOT CONTROLLED WITH YOUR NAUSEA MEDICATION *UNUSUAL SHORTNESS OF BREATH *UNUSUAL BRUISING OR BLEEDING *URINARY PROBLEMS (pain or burning when urinating, or frequent urination) *BOWEL PROBLEMS (unusual diarrhea, constipation, pain near the anus) TENDERNESS IN MOUTH AND THROAT WITH OR WITHOUT PRESENCE OF ULCERS (sore throat, sores in mouth, or a toothache) UNUSUAL RASH, SWELLING OR PAIN  UNUSUAL VAGINAL DISCHARGE OR ITCHING   Items with * indicate a potential emergency and should be followed up as soon as possible or go to the Emergency Department if any problems should occur.  Please show the CHEMOTHERAPY ALERT CARD or IMMUNOTHERAPY ALERT CARD at check-in to the  Emergency Department and triage nurse.  Should you have questions after your visit or need to cancel or reschedule your appointment, please contact [facility] [phone]  and follow the prompts.  Office hours are 8:00 a.m. to 4:30 p.m. Monday - Friday. Please note that voicemails left after 4:00 p.m. may not be returned until the following business day.  We are closed weekends and major holidays. You have access to a nurse at all times for urgent questions. Please call the main number to the clinic [phone] and follow the prompts.  For any non-urgent questions, you may also contact your provider using MyChart. We now offer e-Visits for anyone 66 and older to request care online for non-urgent symptoms. For details visit [email or web address].   Also download the MyChart app! Go to the app store, search "MyChart", open the app, select [facility], and log in with your MyChart username and password.  Masks are optional in the cancer centers. If you would like for your care team to wear a mask while they are taking care of you, please let them know. You may have one support person who is at least 43 years old accompany you for your appointments.

## 2022-08-22 NOTE — Progress Notes (Signed)
Patient presents today for Zirabev/FOLFOXIRI with pump start.  Labs on 08/21/22 and vital signs within parameters for treatment.  Patient has no new complaints at this time.    Treatment given today per MD orders.  Stable during infusion without adverse affects.  Vital signs stable.  No complaints at this time.  Discharge from clinic ambulatory in stable condition.  Alert and oriented X 3.  Follow up with [facility] as scheduled.

## 2022-08-24 ENCOUNTER — Inpatient Hospital Stay: Payer: BC Managed Care – PPO

## 2022-08-24 VITALS — BP 115/65 | HR 82 | Temp 98.3°F | Resp 18

## 2022-08-24 DIAGNOSIS — C20 Malignant neoplasm of rectum: Secondary | ICD-10-CM

## 2022-08-24 DIAGNOSIS — Z95828 Presence of other vascular implants and grafts: Secondary | ICD-10-CM

## 2022-08-24 DIAGNOSIS — Z5111 Encounter for antineoplastic chemotherapy: Secondary | ICD-10-CM | POA: Diagnosis not present

## 2022-08-24 MED ORDER — HEPARIN SOD (PORK) LOCK FLUSH 100 UNIT/ML IV SOLN
500.0000 [IU] | Freq: Once | INTRAVENOUS | Status: AC | PRN
  Administered 2022-08-24: 500 [IU]

## 2022-08-24 MED ORDER — SODIUM CHLORIDE 0.9% FLUSH
10.0000 mL | INTRAVENOUS | Status: DC | PRN
  Administered 2022-08-24: 10 mL

## 2022-08-24 NOTE — Progress Notes (Signed)
Patients port flushed without difficulty.  Good blood return noted with no bruising or swelling noted at site.  Band aid applied.  Home infusion 5FU pump disconnected.  Skin around port was very red and irritated from the dressing.  Patient c/o pain and irritation each time we use those dressings, but this time was much worse.  We will try to find a new dressing for her.  VSS with discharge and left in satisfactory condition with no s/s of distress noted.

## 2022-08-24 NOTE — Patient Instructions (Signed)
[facility]  Discharge Instructions: Thank you for choosing [facility] to provide your oncology and hematology care.  If you have a lab appointment with the Cancer Center, please come in thru the Main Entrance and check in at the main information desk.  Wear comfortable clothing and clothing appropriate for easy access to any Portacath or PICC line.   We strive to give you quality time with your provider. You may need to reschedule your appointment if you arrive late (15 or more minutes).  Arriving late affects you and other patients whose appointments are after yours.  Also, if you miss three or more appointments without notifying the office, you may be dismissed from the clinic at the provider's discretion.      For prescription refill requests, have your pharmacy contact our office and allow 72 hours for refills to be completed.     To help prevent nausea and vomiting after your treatment, we encourage you to take your nausea medication as directed.  BELOW ARE SYMPTOMS THAT SHOULD BE REPORTED IMMEDIATELY: *FEVER GREATER THAN 100.4 F (38 C) OR HIGHER *CHILLS OR SWEATING *NAUSEA AND VOMITING THAT IS NOT CONTROLLED WITH YOUR NAUSEA MEDICATION *UNUSUAL SHORTNESS OF BREATH *UNUSUAL BRUISING OR BLEEDING *URINARY PROBLEMS (pain or burning when urinating, or frequent urination) *BOWEL PROBLEMS (unusual diarrhea, constipation, pain near the anus) TENDERNESS IN MOUTH AND THROAT WITH OR WITHOUT PRESENCE OF ULCERS (sore throat, sores in mouth, or a toothache) UNUSUAL RASH, SWELLING OR PAIN  UNUSUAL VAGINAL DISCHARGE OR ITCHING   Items with * indicate a potential emergency and should be followed up as soon as possible or go to the Emergency Department if any problems should occur.  Please show the CHEMOTHERAPY ALERT CARD or IMMUNOTHERAPY ALERT CARD at check-in to the Emergency Department and triage nurse.  Should you have questions after your visit or need to  cancel or reschedule your appointment, please contact [facility] [phone]  and follow the prompts.  Office hours are 8:00 a.m. to 4:30 p.m. Monday - Friday. Please note that voicemails left after 4:00 p.m. may not be returned until the following business day.  We are closed weekends and major holidays. You have access to a nurse at all times for urgent questions. Please call the main number to the clinic [phone] and follow the prompts.  For any non-urgent questions, you may also contact your provider using MyChart. We now offer e-Visits for anyone 18 and older to request care online for non-urgent symptoms. For details visit [email or web address].   Also download the MyChart app! Go to the app store, search "MyChart", open the app, select [facility], and log in with your MyChart username and password.  Masks are optional in the cancer centers. If you would like for your care team to wear a mask while they are taking care of you, please let them know. You may have one support person who is at least 43 years old accompany you for your appointments.  

## 2022-08-28 ENCOUNTER — Encounter: Payer: Self-pay | Admitting: Family

## 2022-08-28 ENCOUNTER — Ambulatory Visit (INDEPENDENT_AMBULATORY_CARE_PROVIDER_SITE_OTHER): Payer: BC Managed Care – PPO | Admitting: Family

## 2022-08-28 VITALS — BP 134/86 | HR 107 | Temp 97.8°F | Ht 68.0 in | Wt 142.8 lb

## 2022-08-28 DIAGNOSIS — Z0001 Encounter for general adult medical examination with abnormal findings: Secondary | ICD-10-CM

## 2022-08-28 DIAGNOSIS — Z0000 Encounter for general adult medical examination without abnormal findings: Secondary | ICD-10-CM

## 2022-08-28 DIAGNOSIS — C20 Malignant neoplasm of rectum: Secondary | ICD-10-CM | POA: Diagnosis not present

## 2022-08-28 DIAGNOSIS — F17200 Nicotine dependence, unspecified, uncomplicated: Secondary | ICD-10-CM | POA: Diagnosis not present

## 2022-08-28 DIAGNOSIS — C787 Secondary malignant neoplasm of liver and intrahepatic bile duct: Secondary | ICD-10-CM | POA: Diagnosis not present

## 2022-08-28 NOTE — Progress Notes (Signed)
Subjective:    Patient ID: Terri Mckinney, female    DOB: 01-May-1981, 41 y.o.   MRN: [id number]  Chief Complaint  Patient presents with   Annual Exam   PT presents to the office today for CPE. 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 She is followed by Oncologists every 2 weeks for chemo. Do well. Complaining of fatigue. She is scheduled for surgery once completed.  Nicotine Dependence Presents for follow-up visit. Her urge triggers include company of smokers. The symptoms have been stable. She smokes < 1/2 a pack of cigarettes per day. Compliance with prior treatments has been good.      Review of Systems  All other systems reviewed and are negative.  Family History  Problem Relation Age of Onset   Heart disease Mother    Diabetes Mother    Hearing loss Father    Heart disease Father    Hyperlipidemia Father    Hypertension Father    Stroke Father    Arthritis Father    Diabetes Father    Learning disabilities Brother    Leukemia Maternal Uncle        d. 69s   Colon cancer Paternal Aunt        dx 52s   Diabetes Maternal Grandmother    Heart disease Maternal Grandmother    Diabetes Maternal Grandfather    Diabetes Paternal Grandmother    Diabetes Paternal Grandfather    Breast cancer Cousin 65    Social History   Socioeconomic History   Marital status: Married    Spouse name: Not on file   Number of children: Not on file   Years of education: Not on file   Highest education level: Not on file  Occupational History   Not on file  Tobacco Use   Smoking status: Every Day    Packs/day: 2.00    Years: 27.00    Total pack years: 54.00    Types: Cigarettes   Smokeless tobacco: Never  Vaping Use   Vaping Use: Never used  Substance and Sexual Activity   Alcohol use: No   Drug use: No   Sexual activity: Yes    Birth control/protection: Injection  Other Topics Concern   Not on file  Social History Narrative   Not on file   Social Determinants of Health    Financial Resource Strain: Not on file  Food Insecurity: Not on file  Transportation Needs: Not on file  Physical Activity: Not on file  Stress: Not on file  Social Connections: Not on file        Objective:   Physical Exam Vitals reviewed.  Constitutional:      General: She is not in acute distress.    Appearance: She is well-developed.  HENT:     Head: Normocephalic and atraumatic.     Right Ear: Tympanic membrane normal.     Left Ear: Tympanic membrane normal.  Eyes:     Pupils: Pupils are equal, round, and reactive to light.  Neck:     Thyroid: No thyromegaly.  Cardiovascular:     Rate and Rhythm: Normal rate and regular rhythm.     Heart sounds: Normal heart sounds. No murmur heard. Pulmonary:     Effort: Pulmonary effort is normal. No respiratory distress.     Breath sounds: Normal breath sounds. No wheezing.  Abdominal:     General: Bowel sounds are normal. There is no distension.     Palpations: Abdomen is soft.  Tenderness: There is no abdominal tenderness.     Comments: Ostomy in place  Musculoskeletal:        General: No tenderness. Normal range of motion.     Cervical back: Normal range of motion and neck supple.  Skin:    General: Skin is warm and dry.  Neurological:     Mental Status: She is alert and oriented to person, place, and time.     Cranial Nerves: No cranial nerve deficit.     Deep Tendon Reflexes: Reflexes are normal and symmetric.  Psychiatric:        Behavior: Behavior normal.        Thought Content: Thought content normal.        Judgment: Judgment normal.      BP 134/86   Pulse (!) 107   Temp 97.8 F (36.6 C) (Temporal)   Ht '5\' 8"'  (1.727 m)   Wt 142 lb 12.8 oz (64.8 kg)   BMI 21.71 kg/m       Assessment & Plan:  Terri Mckinney comes in today with chief complaint of Annual Exam   Diagnosis and orders addressed:  1. Annual physical exam - CMP14+EGFR - CBC with Differential/Platelet - Lipid panel - TSH  2.  Rectal cancer metastasized to liver (HCC) - CMP14+EGFR - CBC with Differential/Platelet  3. Rectal cancer ([place]) - CMP14+EGFR - CBC with Differential/Platelet  4. Current smoker - CMP14+EGFR - CBC with Differential/Platelet   Labs pending Health Maintenance reviewed Diet and exercise encouraged  Follow up plan: 1 year   Evelina Dun, FNP

## 2022-08-28 NOTE — Patient Instructions (Signed)

## 2022-08-30 ENCOUNTER — Other Ambulatory Visit: Payer: Self-pay

## 2022-08-30 NOTE — Progress Notes (Signed)
The proposed treatment discussed in conference is for discussion purpose only and is not a binding recommendation.  The patients have not been physically examined, or presented with their treatment options.  Therefore, final treatment plans cannot be decided.  

## 2022-09-05 ENCOUNTER — Inpatient Hospital Stay: Payer: BC Managed Care – PPO | Attending: Hematology

## 2022-09-05 ENCOUNTER — Inpatient Hospital Stay: Payer: BC Managed Care – PPO

## 2022-09-05 ENCOUNTER — Inpatient Hospital Stay (HOSPITAL_BASED_OUTPATIENT_CLINIC_OR_DEPARTMENT_OTHER): Payer: BC Managed Care – PPO | Admitting: Hematology

## 2022-09-05 ENCOUNTER — Other Ambulatory Visit (HOSPITAL_COMMUNITY)
Admission: RE | Admit: 2022-09-05 | Discharge: 2022-09-05 | Disposition: A | Discharge status: Home / Self Care | Payer: BC Managed Care – PPO | Source: Ambulatory Visit | Attending: Family | Admitting: Family

## 2022-09-05 VITALS — BP 109/66 | HR 70 | Temp 97.3°F | Resp 18

## 2022-09-05 DIAGNOSIS — K769 Liver disease, unspecified: Secondary | ICD-10-CM

## 2022-09-05 DIAGNOSIS — Z5111 Encounter for antineoplastic chemotherapy: Secondary | ICD-10-CM | POA: Insufficient documentation

## 2022-09-05 DIAGNOSIS — C20 Malignant neoplasm of rectum: Secondary | ICD-10-CM | POA: Diagnosis not present

## 2022-09-05 DIAGNOSIS — C787 Secondary malignant neoplasm of liver and intrahepatic bile duct: Secondary | ICD-10-CM | POA: Insufficient documentation

## 2022-09-05 DIAGNOSIS — Z5189 Encounter for other specified aftercare: Secondary | ICD-10-CM

## 2022-09-05 DIAGNOSIS — G629 Polyneuropathy, unspecified: Secondary | ICD-10-CM | POA: Insufficient documentation

## 2022-09-05 DIAGNOSIS — Z95828 Presence of other vascular implants and grafts: Secondary | ICD-10-CM

## 2022-09-05 DIAGNOSIS — E7800 Pure hypercholesterolemia, unspecified: Secondary | ICD-10-CM | POA: Diagnosis not present

## 2022-09-05 DIAGNOSIS — Z0000 Encounter for general adult medical examination without abnormal findings: Secondary | ICD-10-CM | POA: Insufficient documentation

## 2022-09-05 LAB — CBC WITH DIFFERENTIAL/PLATELET
Abs Immature Granulocytes: 0.02 10*3/uL (ref 0.00–0.07)
Abs Immature Granulocytes: 0.03 10*3/uL (ref 0.00–0.07)
Basophils Absolute: 0 10*3/uL (ref 0.0–0.1)
Basophils Absolute: 0.1 10*3/uL (ref 0.0–0.1)
Basophils Relative: 1 %
Basophils Relative: 1 %
Eosinophils Absolute: 0.2 10*3/uL (ref 0.0–0.5)
Eosinophils Absolute: 0.2 10*3/uL (ref 0.0–0.5)
Eosinophils Relative: 3 %
Eosinophils Relative: 3 %
HCT: 37.7 % (ref 36.0–46.0)
HCT: 38.1 % (ref 36.0–46.0)
Hemoglobin: 12.7 g/dL (ref 12.0–15.0)
Hemoglobin: 12.9 g/dL (ref 12.0–15.0)
Immature Granulocytes: 0 %
Immature Granulocytes: 1 %
Lymphocytes Relative: 31 %
Lymphocytes Relative: 33 %
Lymphs Abs: 2 10*3/uL (ref 0.7–4.0)
Lymphs Abs: 2.1 10*3/uL (ref 0.7–4.0)
MCH: 33.9 pg (ref 26.0–34.0)
MCH: 33.9 pg (ref 26.0–34.0)
MCHC: 33.7 g/dL (ref 30.0–36.0)
MCHC: 33.9 g/dL (ref 30.0–36.0)
MCV: 100.3 fL — ABNORMAL HIGH (ref 80.0–100.0)
MCV: 100.5 fL — ABNORMAL HIGH (ref 80.0–100.0)
Monocytes Absolute: 0.5 10*3/uL (ref 0.1–1.0)
Monocytes Absolute: 0.6 10*3/uL (ref 0.1–1.0)
Monocytes Relative: 8 %
Monocytes Relative: 9 %
Neutro Abs: 3.6 10*3/uL (ref 1.7–7.7)
Neutro Abs: 3.6 10*3/uL (ref 1.7–7.7)
Neutrophils Relative %: 55 %
Neutrophils Relative %: 55 %
Platelets: 183 10*3/uL (ref 150–400)
Platelets: 188 10*3/uL (ref 150–400)
RBC: 3.75 MIL/uL — ABNORMAL LOW (ref 3.87–5.11)
RBC: 3.8 MIL/uL — ABNORMAL LOW (ref 3.87–5.11)
RDW: 16.1 % — ABNORMAL HIGH (ref 11.5–15.5)
RDW: 16.2 % — ABNORMAL HIGH (ref 11.5–15.5)
WBC: 6.4 10*3/uL (ref 4.0–10.5)
WBC: 6.5 10*3/uL (ref 4.0–10.5)
nRBC: 0 % (ref 0.0–0.2)
nRBC: 0 % (ref 0.0–0.2)

## 2022-09-05 LAB — COMPREHENSIVE METABOLIC PANEL
ALT: 20 U/L (ref 0–44)
ALT: 21 U/L (ref 0–44)
AST: 16 U/L (ref 15–41)
AST: 16 U/L (ref 15–41)
Albumin: 3.4 g/dL — ABNORMAL LOW (ref 3.5–5.0)
Albumin: 3.5 g/dL (ref 3.5–5.0)
Alkaline Phosphatase: 54 U/L (ref 38–126)
Alkaline Phosphatase: 54 U/L (ref 38–126)
Anion gap: 7 (ref 5–15)
Anion gap: 8 (ref 5–15)
BUN: 15 mg/dL (ref 6–20)
BUN: 16 mg/dL (ref 6–20)
CO2: 23 mmol/L (ref 22–32)
CO2: 23 mmol/L (ref 22–32)
Calcium: 8.8 mg/dL — ABNORMAL LOW (ref 8.9–10.3)
Calcium: 9 mg/dL (ref 8.9–10.3)
Chloride: 107 mmol/L (ref 98–111)
Chloride: 107 mmol/L (ref 98–111)
Creatinine, Ser: 0.58 mg/dL (ref 0.44–1.00)
Creatinine, Ser: 0.59 mg/dL (ref 0.44–1.00)
GFR, Estimated: >60 mL/min (ref >60)
GFR, Estimated: >60 mL/min (ref >60)
Glucose, Bld: 103 mg/dL — ABNORMAL HIGH (ref 70–99)
Glucose, Bld: 105 mg/dL — ABNORMAL HIGH (ref 70–99)
Potassium: 4.2 mmol/L (ref 3.5–5.1)
Potassium: 4.2 mmol/L (ref 3.5–5.1)
Sodium: 137 mmol/L (ref 135–145)
Sodium: 138 mmol/L (ref 135–145)
Total Bilirubin: 0.6 mg/dL (ref 0.3–1.2)
Total Bilirubin: 0.7 mg/dL (ref 0.3–1.2)
Total Protein: 6.4 g/dL — ABNORMAL LOW (ref 6.5–8.1)
Total Protein: 6.4 g/dL — ABNORMAL LOW (ref 6.5–8.1)

## 2022-09-05 LAB — LIPID PANEL
Cholesterol: 211 mg/dL — ABNORMAL HIGH (ref 0–200)
HDL: 58 mg/dL (ref >40)
LDL Cholesterol: 110 mg/dL — ABNORMAL HIGH (ref 0–99)
Total CHOL/HDL Ratio: 3.6 RATIO
Triglycerides: 214 mg/dL — ABNORMAL HIGH (ref <150)
VLDL: 43 mg/dL — ABNORMAL HIGH (ref 0–40)

## 2022-09-05 LAB — TSH: TSH: 0.648 u[IU]/mL (ref 0.350–4.500)

## 2022-09-05 LAB — MAGNESIUM: Magnesium: 2.1 mg/dL (ref 1.7–2.4)

## 2022-09-05 LAB — PREGNANCY, URINE: Preg Test, Ur: NEGATIVE

## 2022-09-05 MED ORDER — SODIUM CHLORIDE 0.9 % IV SOLN
2400.0000 mg/m2 | INTRAVENOUS | Status: DC
  Administered 2022-09-05: 4150 mg via INTRAVENOUS
  Filled 2022-09-05: qty 83

## 2022-09-05 MED ORDER — SODIUM CHLORIDE 0.9 % IV SOLN
165.0000 mg/m2 | Freq: Once | INTRAVENOUS | Status: AC
  Administered 2022-09-05: 280 mg via INTRAVENOUS
  Filled 2022-09-05: qty 4

## 2022-09-05 MED ORDER — SODIUM CHLORIDE 0.9 % IV SOLN
150.0000 mg | Freq: Once | INTRAVENOUS | Status: AC
  Administered 2022-09-05: 150 mg via INTRAVENOUS
  Filled 2022-09-05: qty 150

## 2022-09-05 MED ORDER — LEUCOVORIN CALCIUM INJECTION 350 MG
400.0000 mg/m2 | Freq: Once | INTRAVENOUS | Status: AC
  Administered 2022-09-05: 692 mg via INTRAVENOUS
  Filled 2022-09-05: qty 34.6

## 2022-09-05 MED ORDER — ATROPINE SULFATE 1 MG/ML IV SOLN
0.5000 mg | Freq: Once | INTRAVENOUS | Status: AC
  Administered 2022-09-05: 0.5 mg via INTRAVENOUS
  Filled 2022-09-05: qty 1

## 2022-09-05 MED ORDER — OXALIPLATIN CHEMO INJECTION 100 MG/20ML
75.0000 mg/m2 | Freq: Once | INTRAVENOUS | Status: AC
  Administered 2022-09-05: 130 mg via INTRAVENOUS
  Filled 2022-09-05: qty 20

## 2022-09-05 MED ORDER — SODIUM CHLORIDE 0.9 % IV SOLN
10.0000 mg | Freq: Once | INTRAVENOUS | Status: AC
  Administered 2022-09-05: 10 mg via INTRAVENOUS
  Filled 2022-09-05: qty 1

## 2022-09-05 MED ORDER — SODIUM CHLORIDE 0.9 % IV SOLN: Freq: Once | INTRAVENOUS | Status: AC

## 2022-09-05 MED ORDER — PALONOSETRON HCL INJECTION 0.25 MG/5ML
0.2500 mg | Freq: Once | INTRAVENOUS | Status: AC
  Administered 2022-09-05: 0.25 mg via INTRAVENOUS
  Filled 2022-09-05: qty 5

## 2022-09-05 MED ORDER — SODIUM CHLORIDE 0.9% FLUSH
10.0000 mL | INTRAVENOUS | Status: DC | PRN
  Administered 2022-09-05: 10 mL

## 2022-09-05 MED ORDER — DEXTROSE 5 % IV SOLN: Freq: Once | INTRAVENOUS | Status: AC

## 2022-09-05 MED ORDER — SODIUM CHLORIDE 0.9 % IV SOLN
5.0000 mg/kg | Freq: Once | INTRAVENOUS | Status: AC
  Administered 2022-09-05: 300 mg via INTRAVENOUS
  Filled 2022-09-05: qty 12

## 2022-09-05 NOTE — Patient Instructions (Signed)
[facility] Discharge Instructions   You were seen and examined today by Dr. Katragadda.  He reviewed the results of your lab work which are normal/stable.   We will proceed with your treatment today.   Return as scheduled in 2 weeks.    Thank you for choosing [facility] at [facility] to provide your oncology and hematology care.  To afford each patient quality time with our provider, please arrive at least 15 minutes before your scheduled appointment time.   If you have a lab appointment with the Cancer Center please come in thru the Main Entrance and check in at the main information desk.  You need to re-schedule your appointment should you arrive 10 or more minutes late.  We strive to give you quality time with our providers, and arriving late affects you and other patients whose appointments are after yours.  Also, if you no show three or more times for appointments you may be dismissed from the clinic at the providers discretion.     Again, thank you for choosing [facility].  Our hope is that these requests will decrease the amount of time that you wait before being seen by our physicians.       _____________________________________________________________  Should you have questions after your visit to [facility], please contact our office at [phone] and follow the prompts.  Our office hours are 8:00 a.m. and 4:30 p.m. Monday - Friday.  Please note that voicemails left after 4:00 p.m. may not be returned until the following business day.  We are closed weekends and major holidays.  You do have access to a nurse 24-7, just call the main number to the clinic [phone] and do not press any options, hold on the line and a nurse will answer the phone.    For prescription refill requests, have your pharmacy contact our office and allow 72 hours.    Due to Covid, you will need to wear a mask upon  entering the hospital. If you do not have a mask, a mask will be given to you at the Main Entrance upon arrival. For doctor visits, patients may have 1 support person age 18 or older with them. For treatment visits, patients can not have anyone with them due to social distancing guidelines and our immunocompromised population.      

## 2022-09-05 NOTE — Progress Notes (Signed)
Patient tolerated chemotherapy with no complaints voiced. Side effects with management reviewed understanding verbalized. Port site clean and dry with no bruising or swelling noted at site. Good blood return noted before and after administration of chemotherapy. Chemo pump connected with no alarms noted. Patient left in satisfactory condition with VSS and no s/s of distress noted.  °

## 2022-09-05 NOTE — Progress Notes (Signed)
Patients port flushed without difficulty.  Good blood return noted with no bruising or swelling noted at site.  Stable during access and blood draw.  Patient to remain accessed for treatment. 

## 2022-09-05 NOTE — Progress Notes (Signed)
OK to proceed without urine pregnancy reported, patient reports no chance of pregnancy.  T.O. Dr Rhys Martini, PharmD

## 2022-09-05 NOTE — Patient Instructions (Signed)
[facility]  Discharge Instructions: Thank you for choosing [facility] to provide your oncology and hematology care.  If you have a lab appointment with the [facility], please come in thru the Main Entrance and check in at the main information desk.  Wear comfortable clothing and clothing appropriate for easy access to any Portacath or PICC line.   We strive to give you quality time with your provider. You may need to reschedule your appointment if you arrive late (15 or more minutes).  Arriving late affects you and other patients whose appointments are after yours.  Also, if you miss three or more appointments without notifying the office, you may be dismissed from the clinic at the provider's discretion.      For prescription refill requests, have your pharmacy contact our office and allow 72 hours for refills to be completed.    Today you received the following chemotherapy and/or immunotherapy agents FOLFOXIRIRI, return as scheduled.   To help prevent nausea and vomiting after your treatment, we encourage you to take your nausea medication as directed.  BELOW ARE SYMPTOMS THAT SHOULD BE REPORTED IMMEDIATELY: *FEVER GREATER THAN 100.4 F (38 C) OR HIGHER *CHILLS OR SWEATING *NAUSEA AND VOMITING THAT IS NOT CONTROLLED WITH YOUR NAUSEA MEDICATION *UNUSUAL SHORTNESS OF BREATH *UNUSUAL BRUISING OR BLEEDING *URINARY PROBLEMS (pain or burning when urinating, or frequent urination) *BOWEL PROBLEMS (unusual diarrhea, constipation, pain near the anus) TENDERNESS IN MOUTH AND THROAT WITH OR WITHOUT PRESENCE OF ULCERS (sore throat, sores in mouth, or a toothache) UNUSUAL RASH, SWELLING OR PAIN  UNUSUAL VAGINAL DISCHARGE OR ITCHING   Items with * indicate a potential emergency and should be followed up as soon as possible or go to the Emergency Department if any problems should occur.  Please show the CHEMOTHERAPY ALERT CARD or IMMUNOTHERAPY ALERT CARD at  check-in to the Emergency Department and triage nurse.  Should you have questions after your visit or need to cancel or reschedule your appointment, please contact [facility] [phone]  and follow the prompts.  Office hours are 8:00 a.m. to 4:30 p.m. Monday - Friday. Please note that voicemails left after 4:00 p.m. may not be returned until the following business day.  We are closed weekends and major holidays. You have access to a nurse at all times for urgent questions. Please call the main number to the clinic [phone] and follow the prompts.  For any non-urgent questions, you may also contact your provider using MyChart. We now offer e-Visits for anyone 38 and older to request care online for non-urgent symptoms. For details visit [email or web address].   Also download the MyChart app! Go to the app store, search "MyChart", open the app, select [facility], and log in with your MyChart username and password.  Masks are optional in the cancer centers. If you would like for your care team to wear a mask while they are taking care of you, please let them know. You may have one support person who is at least 43 years old accompany you for your appointments.

## 2022-09-06 ENCOUNTER — Other Ambulatory Visit: Payer: Self-pay

## 2022-09-07 ENCOUNTER — Inpatient Hospital Stay: Payer: BC Managed Care – PPO

## 2022-09-07 VITALS — BP 121/66 | HR 97 | Temp 97.3°F | Resp 18

## 2022-09-07 DIAGNOSIS — C20 Malignant neoplasm of rectum: Secondary | ICD-10-CM

## 2022-09-07 DIAGNOSIS — Z5111 Encounter for antineoplastic chemotherapy: Secondary | ICD-10-CM | POA: Diagnosis not present

## 2022-09-07 DIAGNOSIS — Z95828 Presence of other vascular implants and grafts: Secondary | ICD-10-CM

## 2022-09-07 LAB — CEA: CEA: 3 ng/mL (ref 0.0–4.7)

## 2022-09-07 MED ORDER — HEPARIN SOD (PORK) LOCK FLUSH 100 UNIT/ML IV SOLN
500.0000 [IU] | Freq: Once | INTRAVENOUS | Status: AC | PRN
  Administered 2022-09-07: 500 [IU]

## 2022-09-07 MED ORDER — SODIUM CHLORIDE 0.9% FLUSH
10.0000 mL | INTRAVENOUS | Status: DC | PRN
  Administered 2022-09-07: 10 mL

## 2022-09-07 NOTE — Progress Notes (Signed)
Patient for chemotherapy pump disconnect with no complaints voiced.  Patients port flushed without difficulty.  Good blood return noted with no bruising or swelling noted at site.  Band aid applied.  VSS with discharge and left ambulatory with no s/s of distress noted.   

## 2022-09-11 ENCOUNTER — Encounter: Payer: Self-pay | Admitting: Licensed Clinical Social Worker

## 2022-09-11 ENCOUNTER — Telehealth: Payer: Self-pay | Admitting: Licensed Clinical Social Worker

## 2022-09-11 ENCOUNTER — Ambulatory Visit: Payer: Self-pay | Admitting: Licensed Clinical Social Worker

## 2022-09-11 DIAGNOSIS — Z1379 Encounter for other screening for genetic and chromosomal anomalies: Secondary | ICD-10-CM

## 2022-09-11 NOTE — Telephone Encounter (Signed)
I contacted Ms. Massingale to discuss her genetic testing results. No pathogenic variants were identified in the 47 genes analyzed. Detailed clinic note to follow.   The test report has been scanned into EPIC and is located under the Molecular Pathology section of the Results Review tab.  A portion of the result report is included below for reference.      Faith Rogue, MS, [facility] Genetic Counselor [email or web address] Phone: [phone]

## 2022-09-11 NOTE — Progress Notes (Signed)
HPI:   Ms. Bonnette was previously seen in the [facility] due to a personal and family history of cancer and concerns regarding a hereditary predisposition to cancer. Please refer to our prior cancer genetics clinic note for more information regarding our discussion, assessment and recommendations, at the time. Ms. Naeve recent genetic test results were disclosed to her, as were recommendations warranted by these results. These results and recommendations are discussed in more detail below.  CANCER HISTORY:  Oncology History  Rectal cancer ([place])  03/28/2022 Initial Diagnosis   Rectal cancer ([facility])   05/29/2022 - 07/26/2022 Chemotherapy   Patient is on Treatment Plan : COLORECTAL FOLFOXIRI + Bevacizumab q14d     05/29/2022 -  Chemotherapy   Patient is on Treatment Plan : COLORECTAL FOLFOXIRI + Bevacizumab q14d      Genetic Testing   No pathogenic variants identified on the [facility] CustomNext+RNA panel. The report date is 09/08/2022.  The CustomNext-Cancer+RNAinsight panel offered by [facility] includes sequencing and rearrangement analysis for the following 47 genes:  APC, ATM, AXIN2, BARD1, BMPR1A, BRCA1, BRCA2, BRIP1, CDH1, CDK4, CDKN2A, CHEK2, DICER1, EPCAM, GREM1, HOXB13, MEN1, MLH1, MSH2, MSH3, MSH6, MUTYH, NBN, NF1, NF2, NTHL1, PALB2, PMS2, POLD1, POLE, PTEN, RAD51C, RAD51D, RECQL, RET, SDHA, SDHAF2, SDHB, SDHC, SDHD, SMAD4, SMARCA4, STK11, TP53, TSC1, TSC2, and VHL.  RNA data is routinely analyzed for use in variant interpretation for all genes.     FAMILY HISTORY:  We obtained a detailed, 4-generation family history.  Significant diagnoses are listed below: Family History  Problem Relation Age of Onset   Heart disease Mother    Diabetes Mother    Hearing loss Father    Heart disease Father    Hyperlipidemia Father    Hypertension Father    Stroke Father    Arthritis Father    Diabetes Father    Learning disabilities Brother    Leukemia Maternal Uncle         d. 47s   Colon cancer Paternal Aunt        dx 66s   Diabetes Maternal Grandmother    Heart disease Maternal Grandmother    Diabetes Maternal Grandfather    Diabetes Paternal Grandmother    Diabetes Paternal Grandfather    Breast cancer Cousin 20    Ms. Waheed has 1 son, 47, 1 daughter, 7. She has 4 sisters and 1 brother. One of her sisters had a colorectal tumor removed that 6 years ago that patient believes was benign.    Ms. Mcmenamin mother is living at 44. A maternal uncle passed of leukemia in his 33s. Maternal cousin passed of breast cancer at 74, her brother passed of leukemia at age 57. Maternal grandmother's siblings had cancers, patient believes skin and nose cancer and an unknown cancer.   Ms. Booz father passed at 62. Patient had 1 paternal aunt that had colon cancer in her 37s and passed in her 9s of a recurrence. No other known cancers on this side of the family.   Ms. Begue is unaware of previous family history of genetic testing for hereditary cancer risks. There is no reported Ashkenazi Jewish ancestry. There is no known consanguinity.    GENETIC TEST RESULTS:  The [facility] CustomNext-Cancer+RNA Panel found no pathogenic mutations.   The CustomNext-Cancer+RNAinsight panel offered by [facility] includes sequencing and rearrangement analysis for the following 47 genes:  APC, ATM, AXIN2, BARD1, BMPR1A, BRCA1, BRCA2, BRIP1, CDH1, CDK4, CDKN2A, CHEK2, DICER1, EPCAM, GREM1, HOXB13, MEN1, MLH1, MSH2,  MSH3, MSH6, MUTYH, NBN, NF1, NF2, NTHL1, PALB2, PMS2, POLD1, POLE, PTEN, RAD51C, RAD51D, RECQL, RET, SDHA, SDHAF2, SDHB, SDHC, SDHD, SMAD4, SMARCA4, STK11, TP53, TSC1, TSC2, and VHL.  RNA data is routinely analyzed for use in variant interpretation for all genes.   The test report has been scanned into EPIC and is located under the Molecular Pathology section of the Results Review tab.  A portion of the result report is included below for reference. Genetic testing  reported out on 09/08/2022.       Even though a pathogenic variant was not identified, possible explanations for the cancer in the family may include: There may be no hereditary risk for cancer in the family. The cancers in Ms. Burford and/or her family may be sporadic/familial or due to other genetic and environmental factors. There may be a gene mutation in one of these genes that current testing methods cannot detect but that chance is small. There could be another gene that has not yet been discovered, or that we have not yet tested, that is responsible for the cancer diagnoses in the family.  It is also possible there is a hereditary cause for the cancer in the family that Ms. Ivanoff did not inherit.  Therefore, it is important to remain in touch with cancer genetics in the future so that we can continue to offer Ms. Defeo the most up to date genetic testing.    ADDITIONAL GENETIC TESTING:  We discussed with Ms. Tidd that her genetic testing was fairly extensive.  If there are additional relevant genes identified to increase cancer risk that can be analyzed in the future, we would be happy to discuss and coordinate this testing at that time.    CANCER SCREENING RECOMMENDATIONS:  Ms. Brodhead test result is considered negative (normal).  This means that we have not identified a hereditary cause for her personal and family history of cancer at this time.   An individual's cancer risk and medical management are not determined by genetic test results alone. Overall cancer risk assessment incorporates additional factors, including personal medical history, family history, and any available genetic information that may result in a personalized plan for cancer prevention and surveillance. Therefore, it is recommended she continue to follow the cancer management and screening guidelines provided by her oncology and primary healthcare provider.  RECOMMENDATIONS FOR FAMILY MEMBERS:   Since she  did not inherit a identifiable mutation in a cancer predisposition gene included on this panel, her children could not have inherited a known mutation from her in one of these genes. Individuals in this family might be at some increased risk of developing cancer, over the general population risk, due to the family history of cancer.  Individuals in the family should notify their providers of the family history of cancer. We recommend women in this family have a yearly mammogram beginning at age 62, or 10 years younger than the earliest onset of cancer, an annual clinical breast exam, and perform monthly breast self-exams.  Family members should have colonoscopies by at age 21, or earlier, as recommended by their providers. Other members of the family may still carry a pathogenic variant in one of these genes that Ms. Monds did not inherit. Based on the family history, we recommend those related to her cousin, who was diagnosed with breast cancer at age 96, have genetic counseling and testing. Ms. Montesano will let us know if we can be of any assistance in coordinating genetic counseling and/or testing for this  family member.    FOLLOW-UP:  Lastly, we discussed with Ms. Holts that cancer genetics is a rapidly advancing field and it is possible that new genetic tests will be appropriate for her and/or her family members in the future. We encouraged her to remain in contact with cancer genetics on an annual basis so we can update her personal and family histories and let her know of advances in cancer genetics that may benefit this family.   Our contact number was provided. Ms. Braun questions were answered to her satisfaction, and she knows she is welcome to call us at anytime with additional questions or concerns.    Faith Rogue, MS, [facility] Genetic Counselor [email or web address] Phone: [phone]

## 2022-09-12 ENCOUNTER — Encounter: Payer: Self-pay | Admitting: Hematology

## 2022-09-15 ENCOUNTER — Encounter: Payer: Self-pay | Admitting: Hematology

## 2022-09-15 NOTE — Progress Notes (Signed)
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 The report date is 09/08/2022.  The CustomNext-Cancer+RNAinsight panel offered by [facility] includes sequencing and rearrangement analysis for the following 47 genes:  APC, ATM, AXIN2, BARD1, BMPR1A, BRCA1, BRCA2, BRIP1, CDH1, CDK4, CDKN2A, CHEK2, DICER1, EPCAM, GREM1, HOXB13, MEN1, MLH1, MSH2, MSH3, MSH6, MUTYH, NBN, NF1, NF2, NTHL1, PALB2, PMS2, POLD1, POLE, PTEN, RAD51C, RAD51D, RECQL, RET, SDHA, SDHAF2, SDHB, SDHC, SDHD, SMAD4, SMARCA4, STK11, TP53, TSC1, TSC2, and VHL.  RNA data is routinely analyzed for use in variant interpretation for all genes.     CANCER STAGING:  Cancer Staging  Rectal cancer [facility]) Staging form: Colon and Rectum, AJCC 8th Edition - Clinical stage from 03/28/2022: Stage IVA (cTX, cN1, cM1a) - Unsigned   INTERVAL HISTORY:  Terri Mckinney, a 41 y.o. female, returns for follow-up and toxicity assessment prior to next cycle of chemotherapy.  She had nausea 1 time which was controlled with Compazine.  She reported sacral pressure but no pain.  Pain has improved since start of chemotherapy.  Denies any tingling or numbness next of days.  REVIEW OF SYSTEMS:  Review of Systems   Constitutional:  Negative for appetite change, fatigue and unexpected weight change.  HENT:   Negative for nosebleeds.   Cardiovascular:  Negative for leg swelling.  Gastrointestinal:  Positive for nausea. Negative for abdominal pain, blood in stool and diarrhea.  Genitourinary:  Negative for hematuria.   Neurological:  Negative for numbness.  Psychiatric/Behavioral:  Negative for sleep disturbance.   All other systems reviewed and are negative.   PAST MEDICAL/SURGICAL HISTORY:  Past Medical History:  Diagnosis Date   Cancer [facility])    Depression, major, single episode, mild ([place])    Port-A-Cath in place 05/24/2022   Past Surgical History:  Procedure Laterality Date   BIOPSY  03/21/2022   Procedure: BIOPSY;  Surgeon: [name], DO;  Location: [facility] ENDO SU[facility];  Service: Endoscopy;;   COLONOSCOPY WITH PROPOFOL N/A 03/21/2022   Procedure: COLONOSCOPY WITH PROPOFOL;  Surgeon: [name], DO;  Location: [facility] ENDO SUITE;  Service: Endoscopy;  Laterality: N/A;  8:30am   FLEXIBLE SIGMOIDOSCOPY N/A 05/19/2022   Procedure: FLEXIBLE SIGMOIDOSCOPY WITH TATTOO INJECTION;  Surgeon: Leighton Ruff, MD;  Location: [facility] ORS;  Service: General;  Laterality: N/A;   LAPAROSCOPIC DIVERTED COLOSTOMY N/A 05/19/2022   Procedure: LAPAROSCOPIC DIVERTED OSTOMY;  Surgeon: Leighton Ruff, MD;  Location: [facility] ORS;  Service: General;  Laterality: N/A;   PORTACATH PLACEMENT Right 05/19/2022   Procedure: INSERTION PORT-A-CATH;  Surgeon: Leighton Ruff, MD;  Location: [facility] ORS;  Service: General;  Laterality: Right;    SOCIAL HISTORY:  Social History   Socioeconomic History   Marital status: Married  Spouse name: Not on file   Number of children: Not on file   Years of education: Not on file   Highest education level: Not on file  Occupational History   Not on file  Tobacco Use   Smoking status: Every Day    Packs/day: 2.00    Years: 27.00    Total pack years: 54.00    Types: Cigarettes   Smokeless  tobacco: Never  Vaping Use   Vaping Use: Never used  Substance and Sexual Activity   Alcohol use: No   Drug use: No   Sexual activity: Yes    Birth control/protection: Injection  Other Topics Concern   Not on file  Social History Narrative   Not on file   Social Determinants of Health   Financial Resource Strain: Not on file  Food Insecurity: Not on file  Transportation Needs: Not on file  Physical Activity: Not on file  Stress: Not on file  Social Connections: Not on file  Intimate Partner Violence: Not on file    FAMILY HISTORY:  Family History  Problem Relation Age of Onset   Heart disease Mother    Diabetes Mother    Hearing loss Father    Heart disease Father    Hyperlipidemia Father    Hypertension Father    Stroke Father    Arthritis Father    Diabetes Father    Learning disabilities Brother    Leukemia Maternal Uncle        d. 34s   Colon cancer Paternal Aunt        dx 60s   Diabetes Maternal Grandmother    Heart disease Maternal Grandmother    Diabetes Maternal Grandfather    Diabetes Paternal Grandmother    Diabetes Paternal Grandfather    Breast cancer Cousin 67    CURRENT MEDICATIONS:  Current Outpatient Medications  Medication Sig Dispense Refill   Bevacizumab (AVASTIN IV) Inject into the vein every 14 (fourteen) days.     Calcium-Cholecalciferol-Zinc 650-20-5.5 MG-MCG-MG CHEW Chew by mouth.     cetirizine (ZYRTEC) 10 MG tablet Take 10 mg by mouth daily.     fluorouracil CALGB 09628 2,400 mg/m2 in sodium chloride 0.9 % 150 mL Inject 2,400 mg/m2 into the vein over 48 hr.     FLUOROURACIL IV Inject into the vein every 14 (fourteen) days.     gabapentin (NEURONTIN) 300 MG capsule Take 1 capsule (300 mg total) by mouth 3 (three) times daily. 30 capsule 3   HYDROcodone-acetaminophen (NORCO) 5-325 MG tablet Take 1 tablet by mouth every 8 (eight) hours as needed for moderate pain. 90 tablet 0   ibuprofen (ADVIL) 200 MG tablet Take 1,000 mg by mouth  every 6 (six) hours as needed for moderate pain.     IRINOTECAN HCL IV Inject into the vein every 14 (fourteen) days.     LEUCOVORIN CALCIUM IV Inject into the vein every 14 (fourteen) days.     medroxyPROGESTERone (DEPO-PROVERA) 150 MG/ML injection INJECT 1 ML (150 MG TOTAL) INTO THE MUSCLE EVERY 3 (THREE) MONTHS 1 mL 0   Multiple Vitamin (MULTIVITAMIN) tablet Take 1 tablet by mouth daily.     OXALIPLATIN IV Inject into the vein every 14 (fourteen) days.     predniSONE (DELTASONE) 20 MG tablet 2 po at sametime daily for 5 days- start tomorrow 10 tablet 0   No current facility-administered medications for this visit.    ALLERGIES:  Allergies  Allergen Reactions   Wellbutrin [Bupropion]  insomnia   Tramadol Rash    PHYSICAL EXAM:  Performance status (ECOG): 0 - Asymptomatic  There were no vitals filed for this visit. Wt Readings from Last 3 Encounters:  09/05/22 146 lb 6.4 oz (66.4 kg)  08/28/22 142 lb 12.8 oz (64.8 kg)  08/22/22 143 lb 3.2 oz (65 kg)   Physical Exam Vitals reviewed.  Constitutional:      Appearance: Normal appearance.  Cardiovascular:     Rate and Rhythm: Normal rate and regular rhythm.     Pulses: Normal pulses.     Heart sounds: Normal heart sounds.  Pulmonary:     Effort: Pulmonary effort is normal.     Breath sounds: Normal breath sounds.  Neurological:     General: No focal deficit present.     Mental Status: She is alert and oriented to person, place, and time.  Psychiatric:        Mood and Affect: Mood normal.        Behavior: Behavior normal.     LABORATORY DATA:  I have reviewed the labs as listed.     Latest Ref Rng & Units 09/05/2022    9:19 AM 09/05/2022    9:09 AM 08/21/2022    9:53 AM  CBC  WBC 4.0 - 10.5 K/uL 6.5  6.4  5.2   Hemoglobin 12.0 - 15.0 g/dL 12.9  12.7  12.8   Hematocrit 36.0 - 46.0 % 38.1  37.7  38.1   Platelets 150 - 400 K/uL 183  188  192       Latest Ref Rng & Units 09/05/2022    9:19 AM 09/05/2022    9:09 AM  08/21/2022    9:53 AM  CMP  Glucose 70 - 99 mg/dL 103  105  104   BUN 6 - 20 mg/dL _0 Creatinine 0.44 - 1.00 mg/dL 0.58  0.59  0.60   Sodium 135 - 145 mmol/L 138  137  137   Potassium 3.5 - 5.1 mmol/L 4.2  4.2  4.0   Chloride 98 - 111 mmol/L 107  107  108   CO2 22 - 32 mmol/L _1 Calcium 8.9 - 10.3 mg/dL 9.0  8.8  8.7   Total Protein 6.5 - 8.1 g/dL 6.4  6.4  6.4   Total Bilirubin 0.3 - 1.2 mg/dL 0.6  0.7  0.6   Alkaline Phos 38 - 126 U/L 54  54  62   AST 15 - 41 U/L _2 ALT 0 - 44 U/L _3 DIAGNOSTIC IMAGING:  I have independently reviewed the scans and discussed with the patient. No results found.   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  Stools have become bloody/mucousy lately. - She also reported pain in the tailbone region since March 2023.  She also has right-sided lower back pain. - 50 pound weight loss in the last 9 months, part of weight loss was intentional.  She cut back on eating sweets and lost taste to sweets after COVID infection. - CT [facility] with contrast on 03/08/2022: Irregular circumferential masslike wall thickening of sigmoid colon/rectum with adjacent perirectal adenopathy.  Multiple small hypodense lesions in the liver, largest 2.9 cm in the central aspect of the liver, question metastatic disease. - Colonoscopy on 03/21/2022  by Dr. Abbey Chatters: Fungating infiltrative nearly completely obstructing mass in the rectosigmoid colon, mass was circumferential measuring 4 cm in length. - Pathology: Rectal mass biopsy consistent with invasive moderately differentiated adenocarcinoma.  As there is very scant invasive tumor, MSI studies were deferred. - PET scan (03/23/2022): Hypermetabolic rectal primary long-segment with SUV 14.3.  Left posterior perirectal lymph node 7 mm with SUV 2.9.  Multiple tiny foci of hepatic hypermetabolism. - MRI of the liver  (04/01/2022): Multiple small hypovascular rim-enhancing liver lesions, predominantly in the right hepatic lobe measuring up to 1.1 cm.  3.3 cm hypervascular mass in the central liver, most consistent with FNH/hepatic adenoma. - Liver biopsy (04/20/2022): Metastatic moderately differentiated colonic adenocarcinoma with mucinous features - NGS testing shows K-ras G12 R mutation, PIK3CA exon 21 mutation.  MS-stable.  TMB-low. - Cycle 1 of FOLFOXIRI on 05/29/2022, bevacizumab added during cycle 2    Social/family history: - She is separated and is seen today with her daughter.  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  She is current active smoker, 1 pack/day for 27 years.  Denies drinking alcohol. - Paternal aunt had colon cancer.  Maternal cousin has breast cancer.  Maternal uncle had leukemia and maternal cousin had leukemia.   PLAN:  Metastatic rectal cancer to the liver: - CTAP on 08/14/2022 showed interval improvement in the wall thickening involving the rectum.  Improved perirectal adenopathy.  Numerous small right hepatic lobe lesions, grossly stable.  Hepatomegaly was seen.  CEA has normalized.  I have reviewed her labs today which showed normal LFTs.  CBC was grossly normal.  TSH was normal.  We have presented her case at the tumor board.  Resection is not possible because of metastatic disease in both lobes of the liver.  Proceed with CEA today.  Dr. Zenia Resides has reached out to Dr. Barry Dienes regarding hepatic intra-arterial pump.  We will proceed with her treatment today.  RTC 2 weeks for follow-up.  2.  Sacral/right lower back pain: - Pain has completely improved since cycle 5.  She has sacral pressure.  Not requiring narcotics or gabapentin or ibuprofen.  3.  Peripheral neuropathy: - Cold sensitivity is lasting 5 to 7 days after each treatment.  She does not report any tingling or numbness in extremities.   Orders placed this encounter:  Orders Placed This Encounter  Procedures   CBC with  Differential   Comprehensive metabolic panel   Urinalysis, dipstick only   CBC with Differential   Comprehensive metabolic panel   Urinalysis, dipstick only   CBC with Differential   Comprehensive metabolic panel   Urinalysis, dipstick only   CBC with Differential   Comprehensive metabolic panel   Urinalysis, dipstick only      Derek Jack, MD [place] [phone]

## 2022-09-18 ENCOUNTER — Encounter: Payer: Self-pay | Admitting: Hematology

## 2022-09-19 ENCOUNTER — Other Ambulatory Visit: Payer: BC Managed Care – PPO

## 2022-09-19 ENCOUNTER — Inpatient Hospital Stay (HOSPITAL_BASED_OUTPATIENT_CLINIC_OR_DEPARTMENT_OTHER): Payer: BC Managed Care – PPO | Admitting: Hematology

## 2022-09-19 ENCOUNTER — Inpatient Hospital Stay: Payer: BC Managed Care – PPO

## 2022-09-19 ENCOUNTER — Ambulatory Visit: Payer: BC Managed Care – PPO

## 2022-09-19 ENCOUNTER — Ambulatory Visit: Payer: BC Managed Care – PPO | Admitting: Hematology

## 2022-09-19 VITALS — BP 113/75 | HR 67 | Temp 97.9°F | Resp 16

## 2022-09-19 DIAGNOSIS — Z95828 Presence of other vascular implants and grafts: Secondary | ICD-10-CM

## 2022-09-19 DIAGNOSIS — C20 Malignant neoplasm of rectum: Secondary | ICD-10-CM | POA: Diagnosis not present

## 2022-09-19 DIAGNOSIS — K769 Liver disease, unspecified: Secondary | ICD-10-CM

## 2022-09-19 DIAGNOSIS — Z5111 Encounter for antineoplastic chemotherapy: Secondary | ICD-10-CM | POA: Diagnosis not present

## 2022-09-19 LAB — CBC WITH DIFFERENTIAL/PLATELET
Abs Immature Granulocytes: 0.01 10*3/uL (ref 0.00–0.07)
Basophils Absolute: 0 10*3/uL (ref 0.0–0.1)
Basophils Relative: 1 %
Eosinophils Absolute: 0.2 10*3/uL (ref 0.0–0.5)
Eosinophils Relative: 2 %
HCT: 40.8 % (ref 36.0–46.0)
Hemoglobin: 13.8 g/dL (ref 12.0–15.0)
Immature Granulocytes: 0 %
Lymphocytes Relative: 29 %
Lymphs Abs: 1.9 10*3/uL (ref 0.7–4.0)
MCH: 34.4 pg — ABNORMAL HIGH (ref 26.0–34.0)
MCHC: 33.8 g/dL (ref 30.0–36.0)
MCV: 101.7 fL — ABNORMAL HIGH (ref 80.0–100.0)
Monocytes Absolute: 0.7 10*3/uL (ref 0.1–1.0)
Monocytes Relative: 11 %
Neutro Abs: 3.8 10*3/uL (ref 1.7–7.7)
Neutrophils Relative %: 57 %
Platelets: 205 10*3/uL (ref 150–400)
RBC: 4.01 MIL/uL (ref 3.87–5.11)
RDW: 15.4 % (ref 11.5–15.5)
WBC: 6.6 10*3/uL (ref 4.0–10.5)
nRBC: 0 % (ref 0.0–0.2)

## 2022-09-19 LAB — COMPREHENSIVE METABOLIC PANEL
ALT: 29 U/L (ref 0–44)
AST: 22 U/L (ref 15–41)
Albumin: 3.7 g/dL (ref 3.5–5.0)
Alkaline Phosphatase: 58 U/L (ref 38–126)
Anion gap: 9 (ref 5–15)
BUN: 11 mg/dL (ref 6–20)
CO2: 23 mmol/L (ref 22–32)
Calcium: 9.5 mg/dL (ref 8.9–10.3)
Chloride: 107 mmol/L (ref 98–111)
Creatinine, Ser: 0.64 mg/dL (ref 0.44–1.00)
GFR, Estimated: >60 mL/min (ref >60)
Glucose, Bld: 95 mg/dL (ref 70–99)
Potassium: 4.3 mmol/L (ref 3.5–5.1)
Sodium: 139 mmol/L (ref 135–145)
Total Bilirubin: 0.4 mg/dL (ref 0.3–1.2)
Total Protein: 6.9 g/dL (ref 6.5–8.1)

## 2022-09-19 LAB — MAGNESIUM: Magnesium: 2.1 mg/dL (ref 1.7–2.4)

## 2022-09-19 MED ORDER — OXALIPLATIN CHEMO INJECTION 100 MG/20ML
59.0000 mg/m2 | Freq: Once | INTRAVENOUS | Status: AC
  Administered 2022-09-19: 100 mg via INTRAVENOUS
  Filled 2022-09-19: qty 20

## 2022-09-19 MED ORDER — SODIUM CHLORIDE 0.9% FLUSH: 10.0000 mL | INTRAVENOUS | Status: DC | PRN

## 2022-09-19 MED ORDER — DEXTROSE 5 % IV SOLN: Freq: Once | INTRAVENOUS | Status: AC

## 2022-09-19 MED ORDER — SODIUM CHLORIDE 0.9 % IV SOLN
2400.0000 mg/m2 | INTRAVENOUS | Status: DC
  Administered 2022-09-19: 4150 mg via INTRAVENOUS
  Filled 2022-09-19: qty 83

## 2022-09-19 MED ORDER — SODIUM CHLORIDE 0.9 % IV SOLN
150.0000 mg | Freq: Once | INTRAVENOUS | Status: AC
  Administered 2022-09-19: 150 mg via INTRAVENOUS
  Filled 2022-09-19: qty 150

## 2022-09-19 MED ORDER — ATROPINE SULFATE 1 MG/ML IV SOLN
0.5000 mg | Freq: Once | INTRAVENOUS | Status: AC
  Administered 2022-09-19: 0.5 mg via INTRAVENOUS
  Filled 2022-09-19: qty 1

## 2022-09-19 MED ORDER — SODIUM CHLORIDE 0.9 % IV SOLN: Freq: Once | INTRAVENOUS | Status: AC

## 2022-09-19 MED ORDER — SODIUM CHLORIDE 0.9 % IV SOLN
5.0000 mg/kg | Freq: Once | INTRAVENOUS | Status: AC
  Administered 2022-09-19: 300 mg via INTRAVENOUS
  Filled 2022-09-19: qty 12

## 2022-09-19 MED ORDER — LEUCOVORIN CALCIUM INJECTION 350 MG
400.0000 mg/m2 | Freq: Once | INTRAVENOUS | Status: AC
  Administered 2022-09-19: 692 mg via INTRAVENOUS
  Filled 2022-09-19: qty 34.6

## 2022-09-19 MED ORDER — PALONOSETRON HCL INJECTION 0.25 MG/5ML
0.2500 mg | Freq: Once | INTRAVENOUS | Status: AC
  Administered 2022-09-19: 0.25 mg via INTRAVENOUS
  Filled 2022-09-19: qty 5

## 2022-09-19 MED ORDER — SODIUM CHLORIDE 0.9 % IV SOLN
10.0000 mg | Freq: Once | INTRAVENOUS | Status: AC
  Administered 2022-09-19: 10 mg via INTRAVENOUS
  Filled 2022-09-19: qty 10

## 2022-09-19 MED ORDER — HEPARIN SOD (PORK) LOCK FLUSH 100 UNIT/ML IV SOLN: 500.0000 [IU] | Freq: Once | INTRAVENOUS | Status: DC | PRN

## 2022-09-19 MED ORDER — SODIUM CHLORIDE 0.9 % IV SOLN
165.0000 mg/m2 | Freq: Once | INTRAVENOUS | Status: AC
  Administered 2022-09-19: 280 mg via INTRAVENOUS
  Filled 2022-09-19: qty 10

## 2022-09-19 NOTE — Patient Instructions (Addendum)
[facility] Discharge Instructions   You were seen and examined today by Dr. Katragadda.  He reviewed the results of your lab work which are normal/stable.   We will proceed with your treatment today.  Return as scheduled.    Thank you for choosing [facility] at [facility] to provide your oncology and hematology care.  To afford each patient quality time with our provider, please arrive at least 15 minutes before your scheduled appointment time.   If you have a lab appointment with the Cancer Center please come in thru the Main Entrance and check in at the main information desk.  You need to re-schedule your appointment should you arrive 10 or more minutes late.  We strive to give you quality time with our providers, and arriving late affects you and other patients whose appointments are after yours.  Also, if you no show three or more times for appointments you may be dismissed from the clinic at the providers discretion.     Again, thank you for choosing [facility].  Our hope is that these requests will decrease the amount of time that you wait before being seen by our physicians.       _____________________________________________________________  Should you have questions after your visit to [facility], please contact our office at [phone] and follow the prompts.  Our office hours are 8:00 a.m. and 4:30 p.m. Monday - Friday.  Please note that voicemails left after 4:00 p.m. may not be returned until the following business day.  We are closed weekends and major holidays.  You do have access to a nurse 24-7, just call the main number to the clinic [phone] and do not press any options, hold on the line and a nurse will answer the phone.    For prescription refill requests, have your pharmacy contact our office and allow 72 hours.    Due to Covid, you will need to wear a mask upon entering  the hospital. If you do not have a mask, a mask will be given to you at the Main Entrance upon arrival. For doctor visits, patients may have 1 support person age 18 or older with them. For treatment visits, patients can not have anyone with them due to social distancing guidelines and our immunocompromised population.      

## 2022-09-19 NOTE — Progress Notes (Signed)
Pt is okay for tx pending CMP results per MD. Oxaliplatin dose reduced to 60 mg/m2 per MD d/t worsening neuropathy. Primary RN and pharmacy made aware.

## 2022-09-19 NOTE — Progress Notes (Signed)
Patient presents today for Fofoxiri per providers order.  Vital signs within parameters for treatment.  Labs pending.    Labs within parameters for treatment.  Message received frok Anastasio Champion RN/Dr. Delton Coombes patient okay for treatment.  Folfoxfiri given today per MD orders.  Stable during infusion without adverse affects.  Vital signs stable.  Pump started and verified RUN on the screen with the patient.  No complaints at this time.  Discharge from clinic ambulatory in stable condition.  Alert and oriented X 3.  Follow up with [facility] as scheduled.

## 2022-09-19 NOTE — Patient Instructions (Signed)
[facility]  Discharge Instructions: Thank you for choosing [facility] to provide your oncology and hematology care.  If you have a lab appointment with the [facility], please come in thru the Main Entrance and check in at the main information desk.  Wear comfortable clothing and clothing appropriate for easy access to any Portacath or PICC line.   We strive to give you quality time with your provider. You may need to reschedule your appointment if you arrive late (15 or more minutes).  Arriving late affects you and other patients whose appointments are after yours.  Also, if you miss three or more appointments without notifying the office, you may be dismissed from the clinic at the provider's discretion.      For prescription refill requests, have your pharmacy contact our office and allow 72 hours for refills to be completed.    Today you received the following chemotherapy and/or immunotherapy agents Folfoxiri Beviciumab      To help prevent nausea and vomiting after your treatment, we encourage you to take your nausea medication as directed.  BELOW ARE SYMPTOMS THAT SHOULD BE REPORTED IMMEDIATELY: *FEVER GREATER THAN 100.4 F (38 C) OR HIGHER *CHILLS OR SWEATING *NAUSEA AND VOMITING THAT IS NOT CONTROLLED WITH YOUR NAUSEA MEDICATION *UNUSUAL SHORTNESS OF BREATH *UNUSUAL BRUISING OR BLEEDING *URINARY PROBLEMS (pain or burning when urinating, or frequent urination) *BOWEL PROBLEMS (unusual diarrhea, constipation, pain near the anus) TENDERNESS IN MOUTH AND THROAT WITH OR WITHOUT PRESENCE OF ULCERS (sore throat, sores in mouth, or a toothache) UNUSUAL RASH, SWELLING OR PAIN  UNUSUAL VAGINAL DISCHARGE OR ITCHING   Items with * indicate a potential emergency and should be followed up as soon as possible or go to the Emergency Department if any problems should occur.  Please show the CHEMOTHERAPY ALERT CARD or IMMUNOTHERAPY ALERT CARD at check-in to  the Emergency Department and triage nurse.  Should you have questions after your visit or need to cancel or reschedule your appointment, please contact [facility] [phone]  and follow the prompts.  Office hours are 8:00 a.m. to 4:30 p.m. Monday - Friday. Please note that voicemails left after 4:00 p.m. may not be returned until the following business day.  We are closed weekends and major holidays. You have access to a nurse at all times for urgent questions. Please call the main number to the clinic [phone] and follow the prompts.  For any non-urgent questions, you may also contact your provider using MyChart. We now offer e-Visits for anyone 5 and older to request care online for non-urgent symptoms. For details visit [email or web address].   Also download the MyChart app! Go to the app store, search "MyChart", open the app, select [facility], and log in with your MyChart username and password.  Masks are optional in the cancer centers. If you would like for your care team to wear a mask while they are taking care of you, please let them know. You may have one support person who is at least 43 years old accompany you for your appointments.

## 2022-09-19 NOTE — Progress Notes (Signed)
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 The report date is 09/08/2022.  The CustomNext-Cancer+RNAinsight panel offered by [facility] includes sequencing and rearrangement analysis for the following 47 genes:  APC, ATM, AXIN2, BARD1, BMPR1A, BRCA1, BRCA2, BRIP1, CDH1, CDK4, CDKN2A, CHEK2, DICER1, EPCAM, GREM1, HOXB13, MEN1, MLH1, MSH2, MSH3, MSH6, MUTYH, NBN, NF1, NF2, NTHL1, PALB2, PMS2, POLD1, POLE, PTEN, RAD51C, RAD51D, RECQL, RET, SDHA, SDHAF2, SDHB, SDHC, SDHD, SMAD4, SMARCA4, STK11, TP53, TSC1, TSC2, and VHL.  RNA data is routinely analyzed for use in variant interpretation for all genes.     CANCER STAGING:  Cancer Staging  Rectal cancer [facility]) Staging form: Colon and Rectum, AJCC 8th Edition - Clinical stage from 03/28/2022: Stage IVA (cTX, cN1, cM1a) - Unsigned   INTERVAL HISTORY:  Terri Mckinney, a 41 y.o. female, seen for follow-up and toxicity assessment prior to cycle 9 of chemotherapy.  She reported cold sensitivity which lasted about 1 week.  She also reported that she is losing taste buds.  However she is eating and maintaining her weight.  She reported new onset numbness in the feet which lasted about 1 to 2 days on and off.   Today she does not have any.  She had brief episode of nausea which also improved with Compazine.   REVIEW OF SYSTEMS:  Review of Systems  Gastrointestinal:  Positive for nausea.  Neurological:  Positive for numbness.  Psychiatric/Behavioral:  Negative for sleep disturbance.   All other systems reviewed and are negative.   PAST MEDICAL/SURGICAL HISTORY:  Past Medical History:  Diagnosis Date   Cancer [facility])    Depression, major, single episode, mild ([place])    Port-A-Cath in place 05/24/2022   Past Surgical History:  Procedure Laterality Date   BIOPSY  03/21/2022   Procedure: BIOPSY;  Surgeon: [name], DO;  Location: [facility] ENDO SU[facility];  Service: Endoscopy;;   COLONOSCOPY WITH PROPOFOL N/A 03/21/2022   Procedure: COLONOSCOPY WITH PROPOFOL;  Surgeon: [name], DO;  Location: [facility] ENDO SUITE;  Service: Endoscopy;  Laterality: N/A;  8:30am   FLEXIBLE SIGMOIDOSCOPY N/A 05/19/2022   Procedure: FLEXIBLE SIGMOIDOSCOPY WITH TATTOO INJECTION;  Surgeon: Leighton Ruff, MD;  Location: [facility] ORS;  Service: General;  Laterality: N/A;   LAPAROSCOPIC DIVERTED COLOSTOMY N/A 05/19/2022   Procedure: LAPAROSCOPIC DIVERTED OSTOMY;  Surgeon: Leighton Ruff, MD;  Location: [facility] ORS;  Service: General;  Laterality: N/A;   PORTACATH PLACEMENT Right 05/19/2022   Procedure: INSERTION PORT-A-CATH;  Surgeon: Leighton Ruff, MD;  Location: [facility] ORS;  Service: General;  Laterality: Right;    SOCIAL HISTORY:  Social History   Socioeconomic History   Marital status: Married    Spouse name: Not on file   Number of children: Not  on file   Years of education: Not on file   Highest education level: Not on file  Occupational History   Not on file  Tobacco Use   Smoking status: Every Day    Packs/day: 2.00    Years: 27.00    Total pack years: 54.00    Types: Cigarettes   Smokeless tobacco: Never  Vaping Use   Vaping Use: Never used  Substance and Sexual Activity   Alcohol use: No   Drug use: No    Sexual activity: Yes    Birth control/protection: Injection  Other Topics Concern   Not on file  Social History Narrative   Not on file   Social Determinants of Health   Financial Resource Strain: Not on file  Food Insecurity: Not on file  Transportation Needs: Not on file  Physical Activity: Not on file  Stress: Not on file  Social Connections: Not on file  Intimate Partner Violence: Not on file    FAMILY HISTORY:  Family History  Problem Relation Age of Onset   Heart disease Mother    Diabetes Mother    Hearing loss Father    Heart disease Father    Hyperlipidemia Father    Hypertension Father    Stroke Father    Arthritis Father    Diabetes Father    Learning disabilities Brother    Leukemia Maternal Uncle        d. 39s   Colon cancer Paternal Aunt        dx 53s   Diabetes Maternal Grandmother    Heart disease Maternal Grandmother    Diabetes Maternal Grandfather    Diabetes Paternal Grandmother    Diabetes Paternal Grandfather    Breast cancer Cousin 43    CURRENT MEDICATIONS:  Current Outpatient Medications  Medication Sig Dispense Refill   Bevacizumab (AVASTIN IV) Inject into the vein every 14 (fourteen) days.     Calcium-Cholecalciferol-Zinc 650-20-5.5 MG-MCG-MG CHEW Chew by mouth.     cetirizine (ZYRTEC) 10 MG tablet Take 10 mg by mouth daily.     fluorouracil CALGB 96283 2,400 mg/m2 in sodium chloride 0.9 % 150 mL Inject 2,400 mg/m2 into the vein over 48 hr.     FLUOROURACIL IV Inject into the vein every 14 (fourteen) days.     gabapentin (NEURONTIN) 300 MG capsule Take 1 capsule (300 mg total) by mouth 3 (three) times daily. 30 capsule 3   HYDROcodone-acetaminophen (NORCO) 5-325 MG tablet Take 1 tablet by mouth every 8 (eight) hours as needed for moderate pain. 90 tablet 0   ibuprofen (ADVIL) 200 MG tablet Take 1,000 mg by mouth every 6 (six) hours as needed for moderate pain.     IRINOTECAN HCL IV Inject into the vein every 14 (fourteen) days.      LEUCOVORIN CALCIUM IV Inject into the vein every 14 (fourteen) days.     medroxyPROGESTERone (DEPO-PROVERA) 150 MG/ML injection INJECT 1 ML (150 MG TOTAL) INTO THE MUSCLE EVERY 3 (THREE) MONTHS 1 mL 0   Multiple Vitamin (MULTIVITAMIN) tablet Take 1 tablet by mouth daily.     OXALIPLATIN IV Inject into the vein every 14 (fourteen) days.     predniSONE (DELTASONE) 20 MG tablet 2 po at sametime daily for 5 days- start tomorrow 10 tablet 0   No current facility-administered medications for this visit.    ALLERGIES:  Allergies  Allergen Reactions   Wellbutrin [Bupropion]     insomnia   Tramadol Rash    PHYSICAL  EXAM:  Performance status (ECOG): 0 - Asymptomatic  There were no vitals filed for this visit. Wt Readings from Last 3 Encounters:  09/19/22 149 lb (67.6 kg)  09/05/22 146 lb 6.4 oz (66.4 kg)  08/28/22 142 lb 12.8 oz (64.8 kg)   Physical Exam Vitals reviewed.  Constitutional:      Appearance: Normal appearance.  Cardiovascular:     Rate and Rhythm: Normal rate and regular rhythm.     Pulses: Normal pulses.     Heart sounds: Normal heart sounds.  Pulmonary:     Effort: Pulmonary effort is normal.     Breath sounds: Normal breath sounds.  Neurological:     General: No focal deficit present.     Mental Status: She is alert and oriented to person, place, and time.  Psychiatric:        Mood and Affect: Mood normal.        Behavior: Behavior normal.    LABORATORY DATA:  I have reviewed the labs as listed.     Latest Ref Rng & Units 09/05/2022    9:19 AM 09/05/2022    9:09 AM 08/21/2022    9:53 AM  CBC  WBC 4.0 - 10.5 K/uL 6.5  6.4  5.2   Hemoglobin 12.0 - 15.0 g/dL 12.9  12.7  12.8   Hematocrit 36.0 - 46.0 % 38.1  37.7  38.1   Platelets 150 - 400 K/uL 183  188  192       Latest Ref Rng & Units 09/05/2022    9:19 AM 09/05/2022    9:09 AM 08/21/2022    9:53 AM  CMP  Glucose 70 - 99 mg/dL 103  105  104   BUN 6 - 20 mg/dL '16  15  11   ' Creatinine 0.44 - 1.00 mg/dL  0.58  0.59  0.60   Sodium 135 - 145 mmol/L 138  137  137   Potassium 3.5 - 5.1 mmol/L 4.2  4.2  4.0   Chloride 98 - 111 mmol/L 107  107  108   CO2 22 - 32 mmol/L '23  23  24   ' Calcium 8.9 - 10.3 mg/dL 9.0  8.8  8.7   Total Protein 6.5 - 8.1 g/dL 6.4  6.4  6.4   Total Bilirubin 0.3 - 1.2 mg/dL 0.6  0.7  0.6   Alkaline Phos 38 - 126 U/L 54  54  62   AST 15 - 41 U/L '16  16  15   ' ALT 0 - 44 U/L '21  20  19     ' DIAGNOSTIC IMAGING:  I have independently reviewed the scans and discussed with the patient. No results found.   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  Stools have become bloody/mucousy lately. - She also reported pain in the tailbone region since March 2023.  She also has right-sided lower back pain. - 50 pound weight loss in the last 9 months, part of weight loss was intentional.  She cut back on eating sweets and lost taste to sweets after COVID infection. - CT [facility] with contrast on 03/08/2022: Irregular circumferential masslike wall thickening of sigmoid colon/rectum with adjacent perirectal adenopathy.  Multiple small hypodense lesions in the liver, largest 2.9 cm in the central aspect of the liver, question metastatic disease. - Colonoscopy on 03/21/2022 by Dr. Abbey Chatters: Fungating infiltrative nearly completely obstructing mass in the rectosigmoid  colon, mass was circumferential measuring 4 cm in length. - Pathology: Rectal mass biopsy consistent with invasive moderately differentiated adenocarcinoma.  As there is very scant invasive tumor, MSI studies were deferred. - PET scan (03/23/2022): Hypermetabolic rectal primary long-segment with SUV 14.3.  Left posterior perirectal lymph node 7 mm with SUV 2.9.  Multiple tiny foci of hepatic hypermetabolism. - MRI of the liver (04/01/2022): Multiple small hypovascular rim-enhancing liver lesions, predominantly in the right hepatic lobe measuring up to  1.1 cm.  3.3 cm hypervascular mass in the central liver, most consistent with FNH/hepatic adenoma. - Liver biopsy (04/20/2022): Metastatic moderately differentiated colonic adenocarcinoma with mucinous features - NGS testing shows K-ras G12 R mutation, PIK3CA exon 21 mutation.  MS-stable.  TMB-low. - Cycle 1 of FOLFOXIRI on 05/29/2022, bevacizumab added during cycle 2    Social/family history: - She is separated and is seen today with her daughter.  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  She is current active smoker, 1 pack/day for 27 years.  Denies drinking alcohol. - Paternal aunt had colon cancer.  Maternal cousin has breast cancer.  Maternal uncle had leukemia and maternal cousin had leukemia.   PLAN:  Metastatic rectal cancer to the liver: - CTAP on 08/14/2022 showed interval improvement in the wall thickening involving the rectum.  Improved perirectal adenopathy.  Numerous small right hepatic lobe lesions, grossly stable.  Hepatomegaly was seen.  Tumor markers CEA has normalized.  Labs today shows normal LFTs.  CBC was grossly normal.  Last CEA was 3.0.  We will proceed with cycle 9 today with dose reduction of oxaliplatin.  She has an appointment to see Dr. Marcello Moores on 10/30/2022.  We will touch base with Dr. Allen/Byerly if there is any indication for hepatic arterial pump.  2.  Sacral/right lower back pain: - Pain has completely improved after cycle 5.  She has some pressure in the sacral region.  She is not requiring narcotics/gabapentin/ibuprofen.  3.  Peripheral neuropathy: - Cold sensitivity is lasting 1 week.  She reported 1 to 2 days of numbness in the feet on and off.  I will cut back oxaliplatin to 60 Mg/m2.  If neuropathy occurs again, will discontinue oxaliplatin.   Orders placed this encounter:  No orders of the defined types were placed in this encounter.     Derek Jack, MD [place] [phone]

## 2022-09-20 ENCOUNTER — Other Ambulatory Visit: Payer: Self-pay | Admitting: Family

## 2022-09-20 DIAGNOSIS — Z3042 Encounter for surveillance of injectable contraceptive: Secondary | ICD-10-CM

## 2022-09-21 ENCOUNTER — Ambulatory Visit (INDEPENDENT_AMBULATORY_CARE_PROVIDER_SITE_OTHER): Payer: BC Managed Care – PPO

## 2022-09-21 ENCOUNTER — Inpatient Hospital Stay: Payer: BC Managed Care – PPO

## 2022-09-21 DIAGNOSIS — Z3042 Encounter for surveillance of injectable contraceptive: Secondary | ICD-10-CM

## 2022-09-21 DIAGNOSIS — Z5111 Encounter for antineoplastic chemotherapy: Secondary | ICD-10-CM | POA: Diagnosis not present

## 2022-09-21 DIAGNOSIS — Z95828 Presence of other vascular implants and grafts: Secondary | ICD-10-CM

## 2022-09-21 DIAGNOSIS — C20 Malignant neoplasm of rectum: Secondary | ICD-10-CM

## 2022-09-21 MED ORDER — SODIUM CHLORIDE 0.9% FLUSH
10.0000 mL | Freq: Once | INTRAVENOUS | Status: AC
  Administered 2022-09-21: 10 mL via INTRAVENOUS

## 2022-09-21 MED ORDER — HEPARIN SOD (PORK) LOCK FLUSH 100 UNIT/ML IV SOLN
500.0000 [IU] | Freq: Once | INTRAVENOUS | Status: AC
  Administered 2022-09-21: 500 [IU] via INTRAVENOUS

## 2022-09-21 MED ORDER — MEDROXYPROGESTERONE ACETATE 150 MG/ML IM SUSP
150.0000 mg | INTRAMUSCULAR | Status: AC
  Administered 2022-09-21: 150 mg via INTRAMUSCULAR

## 2022-09-21 NOTE — Patient Instructions (Signed)
[facility]  Discharge Instructions: Thank you for choosing [facility] to provide your oncology and hematology care.  If you have a lab appointment with the Cancer Center, please come in thru the Main Entrance and check in at the main information desk.  Wear comfortable clothing and clothing appropriate for easy access to any Portacath or PICC line.   We strive to give you quality time with your provider. You may need to reschedule your appointment if you arrive late (15 or more minutes).  Arriving late affects you and other patients whose appointments are after yours.  Also, if you miss three or more appointments without notifying the office, you may be dismissed from the clinic at the provider's discretion.      For prescription refill requests, have your pharmacy contact our office and allow 72 hours for refills to be completed.    Today you received the following chemotherapy and/or immunotherapy agents pump stop      To help prevent nausea and vomiting after your treatment, we encourage you to take your nausea medication as directed.  BELOW ARE SYMPTOMS THAT SHOULD BE REPORTED IMMEDIATELY: *FEVER GREATER THAN 100.4 F (38 C) OR HIGHER *CHILLS OR SWEATING *NAUSEA AND VOMITING THAT IS NOT CONTROLLED WITH YOUR NAUSEA MEDICATION *UNUSUAL SHORTNESS OF BREATH *UNUSUAL BRUISING OR BLEEDING *URINARY PROBLEMS (pain or burning when urinating, or frequent urination) *BOWEL PROBLEMS (unusual diarrhea, constipation, pain near the anus) TENDERNESS IN MOUTH AND THROAT WITH OR WITHOUT PRESENCE OF ULCERS (sore throat, sores in mouth, or a toothache) UNUSUAL RASH, SWELLING OR PAIN  UNUSUAL VAGINAL DISCHARGE OR ITCHING   Items with * indicate a potential emergency and should be followed up as soon as possible or go to the Emergency Department if any problems should occur.  Please show the CHEMOTHERAPY ALERT CARD or IMMUNOTHERAPY ALERT CARD at check-in to the  Emergency Department and triage nurse.  Should you have questions after your visit or need to cancel or reschedule your appointment, please contact [facility] [phone]  and follow the prompts.  Office hours are 8:00 a.m. to 4:30 p.m. Monday - Friday. Please note that voicemails left after 4:00 p.m. may not be returned until the following business day.  We are closed weekends and major holidays. You have access to a nurse at all times for urgent questions. Please call the main number to the clinic [phone] and follow the prompts.  For any non-urgent questions, you may also contact your provider using MyChart. We now offer e-Visits for anyone 18 and older to request care online for non-urgent symptoms. For details visit [email or web address].   Also download the MyChart app! Go to the app store, search "MyChart", open the app, select [facility], and log in with your MyChart username and password.  Masks are optional in the cancer centers. If you would like for your care team to wear a mask while they are taking care of you, please let them know. You may have one support person who is at least 43 years old accompany you for your appointments.  

## 2022-09-21 NOTE — Progress Notes (Signed)
Medroxyprogesterone injection given to left deltoid per patient request.  Patient tolerated well.

## 2022-09-21 NOTE — Progress Notes (Signed)
Patient presents today for 5FU pump stop and disconnection after 46 hour continous infusion.   5FU pump deaccessed.  Patients port flushed without difficulty.  Good blood return noted with no bruising or swelling noted at site.  Needle removed intact.  Band aid applied.  VSS with discharge and left in satisfactory condition ambulatory with no s/s of distress noted.    ?

## 2022-09-22 ENCOUNTER — Other Ambulatory Visit: Payer: Self-pay

## 2022-09-26 ENCOUNTER — Encounter: Payer: Self-pay | Admitting: *Deleted

## 2022-10-02 ENCOUNTER — Inpatient Hospital Stay (HOSPITAL_BASED_OUTPATIENT_CLINIC_OR_DEPARTMENT_OTHER): Payer: BC Managed Care – PPO | Admitting: Hematology

## 2022-10-02 ENCOUNTER — Inpatient Hospital Stay: Payer: BC Managed Care – PPO

## 2022-10-02 ENCOUNTER — Other Ambulatory Visit: Payer: Self-pay

## 2022-10-02 ENCOUNTER — Encounter: Payer: Self-pay | Admitting: Hematology

## 2022-10-02 VITALS — BP 111/74 | HR 76 | Temp 98.2°F | Resp 18

## 2022-10-02 DIAGNOSIS — Z5111 Encounter for antineoplastic chemotherapy: Secondary | ICD-10-CM | POA: Diagnosis not present

## 2022-10-02 DIAGNOSIS — Z95828 Presence of other vascular implants and grafts: Secondary | ICD-10-CM | POA: Diagnosis not present

## 2022-10-02 DIAGNOSIS — C20 Malignant neoplasm of rectum: Secondary | ICD-10-CM | POA: Diagnosis not present

## 2022-10-02 DIAGNOSIS — K769 Liver disease, unspecified: Secondary | ICD-10-CM

## 2022-10-02 LAB — URINALYSIS, DIPSTICK ONLY
Bilirubin Urine: NEGATIVE
Glucose, UA: NEGATIVE mg/dL
Hgb urine dipstick: NEGATIVE
Ketones, ur: NEGATIVE mg/dL
Leukocytes,Ua: NEGATIVE
Nitrite: NEGATIVE
Protein, ur: NEGATIVE mg/dL
Specific Gravity, Urine: 1.02 (ref 1.005–1.030)
pH: 6 (ref 5.0–8.0)

## 2022-10-02 LAB — CBC WITH DIFFERENTIAL/PLATELET
Abs Immature Granulocytes: 0.01 10*3/uL (ref 0.00–0.07)
Basophils Absolute: 0.1 10*3/uL (ref 0.0–0.1)
Basophils Relative: 1 %
Eosinophils Absolute: 0.3 10*3/uL (ref 0.0–0.5)
Eosinophils Relative: 4 %
HCT: 39.3 % (ref 36.0–46.0)
Hemoglobin: 13.5 g/dL (ref 12.0–15.0)
Immature Granulocytes: 0 %
Lymphocytes Relative: 34 %
Lymphs Abs: 2.4 10*3/uL (ref 0.7–4.0)
MCH: 34.6 pg — ABNORMAL HIGH (ref 26.0–34.0)
MCHC: 34.4 g/dL (ref 30.0–36.0)
MCV: 100.8 fL — ABNORMAL HIGH (ref 80.0–100.0)
Monocytes Absolute: 0.5 10*3/uL (ref 0.1–1.0)
Monocytes Relative: 7 %
Neutro Abs: 3.9 10*3/uL (ref 1.7–7.7)
Neutrophils Relative %: 54 %
Platelets: 192 10*3/uL (ref 150–400)
RBC: 3.9 MIL/uL (ref 3.87–5.11)
RDW: 15.3 % (ref 11.5–15.5)
WBC: 7.2 10*3/uL (ref 4.0–10.5)
nRBC: 0 % (ref 0.0–0.2)

## 2022-10-02 LAB — COMPREHENSIVE METABOLIC PANEL
ALT: 21 U/L (ref 0–44)
AST: 17 U/L (ref 15–41)
Albumin: 3.6 g/dL (ref 3.5–5.0)
Alkaline Phosphatase: 53 U/L (ref 38–126)
Anion gap: 9 (ref 5–15)
BUN: 12 mg/dL (ref 6–20)
CO2: 23 mmol/L (ref 22–32)
Calcium: 9.4 mg/dL (ref 8.9–10.3)
Chloride: 108 mmol/L (ref 98–111)
Creatinine, Ser: 0.61 mg/dL (ref 0.44–1.00)
GFR, Estimated: >60 mL/min (ref >60)
Glucose, Bld: 111 mg/dL — ABNORMAL HIGH (ref 70–99)
Potassium: 4 mmol/L (ref 3.5–5.1)
Sodium: 140 mmol/L (ref 135–145)
Total Bilirubin: 0.3 mg/dL (ref 0.3–1.2)
Total Protein: 6.6 g/dL (ref 6.5–8.1)

## 2022-10-02 LAB — MAGNESIUM: Magnesium: 2.1 mg/dL (ref 1.7–2.4)

## 2022-10-02 MED ORDER — SODIUM CHLORIDE 0.9 % IV SOLN
165.0000 mg/m2 | Freq: Once | INTRAVENOUS | Status: AC
  Administered 2022-10-02: 280 mg via INTRAVENOUS
  Filled 2022-10-02: qty 4

## 2022-10-02 MED ORDER — SODIUM CHLORIDE 0.9 % IV SOLN
10.0000 mg | Freq: Once | INTRAVENOUS | Status: AC
  Administered 2022-10-02: 10 mg via INTRAVENOUS
  Filled 2022-10-02: qty 1

## 2022-10-02 MED ORDER — SODIUM CHLORIDE 0.9 % IV SOLN
2400.0000 mg/m2 | INTRAVENOUS | Status: DC
  Administered 2022-10-02: 4150 mg via INTRAVENOUS
  Filled 2022-10-02: qty 83

## 2022-10-02 MED ORDER — SODIUM CHLORIDE 0.9 % IV SOLN
150.0000 mg | Freq: Once | INTRAVENOUS | Status: AC
  Administered 2022-10-02: 150 mg via INTRAVENOUS
  Filled 2022-10-02: qty 150

## 2022-10-02 MED ORDER — SODIUM CHLORIDE 0.9 % IV SOLN
400.0000 mg/m2 | Freq: Once | INTRAVENOUS | Status: AC
  Administered 2022-10-02: 692 mg via INTRAVENOUS
  Filled 2022-10-02: qty 34.6

## 2022-10-02 MED ORDER — ATROPINE SULFATE 1 MG/ML IV SOLN
0.5000 mg | Freq: Once | INTRAVENOUS | Status: AC
  Administered 2022-10-02: 0.5 mg via INTRAVENOUS
  Filled 2022-10-02: qty 1

## 2022-10-02 MED ORDER — SODIUM CHLORIDE 0.9 % IV SOLN: Freq: Once | INTRAVENOUS | Status: AC

## 2022-10-02 MED ORDER — SODIUM CHLORIDE 0.9 % IV SOLN
5.5000 mg/kg | Freq: Once | INTRAVENOUS | Status: AC
  Administered 2022-10-02: 350 mg via INTRAVENOUS
  Filled 2022-10-02: qty 14

## 2022-10-02 MED ORDER — PALONOSETRON HCL INJECTION 0.25 MG/5ML
0.2500 mg | Freq: Once | INTRAVENOUS | Status: AC
  Administered 2022-10-02: 0.25 mg via INTRAVENOUS
  Filled 2022-10-02: qty 5

## 2022-10-02 NOTE — Patient Instructions (Addendum)
[facility] at [facility] Discharge Instructions   You were seen and examined today by Dr. Delton Coombes.  He reviewed part of your lab work which is normal.  We will proceed with your treatment today. We will discontinue the oxaliplatin due to numbness and tingling.   Return as scheduled.    Thank you for choosing [place] at [facility] to provide your oncology and hematology care.  To afford each patient quality time with our provider, please arrive at least 15 minutes before your scheduled appointment time.   If you have a lab appointment with the [facility] please come in thru the Main Entrance and check in at the main information desk.  You need to re-schedule your appointment should you arrive 10 or more minutes late.  We strive to give you quality time with our providers, and arriving late affects you and other patients whose appointments are after yours.  Also, if you no show three or more times for appointments you may be dismissed from the clinic at the providers discretion.     Again, thank you for choosing [facility].  Our hope is that these requests will decrease the amount of time that you wait before being seen by our physicians.       _____________________________________________________________  Should you have questions after your visit to [facility], please contact our office at [phone] and follow the prompts.  Our office hours are 8:00 a.m. and 4:30 p.m. Monday - Friday.  Please note that voicemails left after 4:00 p.m. may not be returned until the following business day.  We are closed weekends and major holidays.  You do have access to a nurse 24-7, just call the main number to the clinic [phone] and do not press any options, hold on the line and a nurse will answer the phone.    For prescription refill requests, have your pharmacy contact our office and allow 72 hours.    Due to  Covid, you will need to wear a mask upon entering the hospital. If you do not have a mask, a mask will be given to you at the Main Entrance upon arrival. For doctor visits, patients may have 1 support person age 2 or older with them. For treatment visits, patients can not have anyone with them due to social distancing guidelines and our immunocompromised population.

## 2022-10-02 NOTE — Progress Notes (Signed)
Patient has been examined by Dr. Delton Coombes, and vital signs and labs have been reviewed. ANC, Creatinine, LFTs, hemoglobin, and platelets are within treatment parameters per M.D. - pt may proceed with treatment. We will discontinue oxaliplatin per MD d/t neuropathy.  Primary RN and pharmacy notified.

## 2022-10-02 NOTE — Progress Notes (Signed)
Labs reviewed with MD today. [place] for treatment per MD. Also, Oxaliplatin was discontinued from treatment plan per MD.   Treatment given per orders. Patient tolerated it well without problems. Vitals stable and discharged home from clinic ambulatory. Follow up as scheduled.

## 2022-10-02 NOTE — Patient Instructions (Addendum)
[facility]  Discharge Instructions: Thank you for choosing [facility] to provide your oncology and hematology care.  If you have a lab appointment with the [facility], please come in thru the Main Entrance and check in at the main information desk.  Wear comfortable clothing and clothing appropriate for easy access to any Portacath or PICC line.   We strive to give you quality time with your provider. You may need to reschedule your appointment if you arrive late (15 or more minutes).  Arriving late affects you and other patients whose appointments are after yours.  Also, if you miss three or more appointments without notifying the office, you may be dismissed from the clinic at the provider's discretion.      For prescription refill requests, have your pharmacy contact our office and allow 72 hours for refills to be completed.    Today you received the following chemotherapy and/or immunotherapy agents, zirabev, irrinotecan, leucovorin, flouroracil   To help prevent nausea and vomiting after your treatment, we encourage you to take your nausea medication as directed.  BELOW ARE SYMPTOMS THAT SHOULD BE REPORTED IMMEDIATELY: *FEVER GREATER THAN 100.4 F (38 C) OR HIGHER *CHILLS OR SWEATING *NAUSEA AND VOMITING THAT IS NOT CONTROLLED WITH YOUR NAUSEA MEDICATION *UNUSUAL SHORTNESS OF BREATH *UNUSUAL BRUISING OR BLEEDING *URINARY PROBLEMS (pain or burning when urinating, or frequent urination) *BOWEL PROBLEMS (unusual diarrhea, constipation, pain near the anus) TENDERNESS IN MOUTH AND THROAT WITH OR WITHOUT PRESENCE OF ULCERS (sore throat, sores in mouth, or a toothache) UNUSUAL RASH, SWELLING OR PAIN  UNUSUAL VAGINAL DISCHARGE OR ITCHING   Items with * indicate a potential emergency and should be followed up as soon as possible or go to the Emergency Department if any problems should occur.  Please show the CHEMOTHERAPY ALERT CARD or IMMUNOTHERAPY  ALERT CARD at check-in to the Emergency Department and triage nurse.  Should you have questions after your visit or need to cancel or reschedule your appointment, please contact [facility] [phone]  and follow the prompts.  Office hours are 8:00 a.m. to 4:30 p.m. Monday - Friday. Please note that voicemails left after 4:00 p.m. may not be returned until the following business day.  We are closed weekends and major holidays. You have access to a nurse at all times for urgent questions. Please call the main number to the clinic [phone] and follow the prompts.  The chemotherapy medication bag should finish at 46 hours, 96 hours, or 7 days. For example, if your pump is scheduled for 46 hours and it was put on at 4:00 p.m., it should finish at 2:00 p.m. the day it is scheduled to come off regardless of your appointment time.     Estimated time to finish at 1440   If the display on your pump reads "Low Volume" and it is beeping, take the batteries out of the pump and come to the cancer center for it to be taken off.   If the pump alarms go off prior to the pump reading "Low Volume" then call [phone] and someone can assist you.  If the plunger comes out and the chemotherapy medication is leaking out, please use your home chemo spill kit to clean up the spill. Do NOT use paper towels or other household products.  If you have problems or questions regarding your pump, please call either [phone] (24 hours a day) or the cancer center Monday-Friday 8:00 a.m.- 4:30 p.m.  at the clinic number and we will assist you. If you are unable to get assistance, then go to the nearest Emergency Department and ask the staff to contact the IV team for assistance.  For any non-urgent questions, you may also contact your provider using MyChart. We now offer e-Visits for anyone 43 and older to request care online for non-urgent symptoms. For details visit [email or web address].    Also download the MyChart app! Go to the app store, search "MyChart", open the app, select [facility], and log in with your MyChart username and password.  Masks are optional in the cancer centers. If you would like for your care team to wear a mask while they are taking care of you, please let them know. You may have one support person who is at least 43 years old accompany you for your appointments.

## 2022-10-02 NOTE — Progress Notes (Signed)
Patient with new weight 69.4kg which impacts dosing of bevacizumab.  Received orders to increase dose of bevacizumab to 350 mg (5 mg/kg).  Orders updated.  Next several doses will be discontinued due to impending upcoming surgery.  T.O. Dr Rhys Martini, PharmD

## 2022-10-02 NOTE — Progress Notes (Signed)
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 The report date is 09/08/2022.  The CustomNext-Cancer+RNAinsight panel offered by [facility] includes sequencing and rearrangement analysis for the following 47 genes:  APC, ATM, AXIN2, BARD1, BMPR1A, BRCA1, BRCA2, BRIP1, CDH1, CDK4, CDKN2A, CHEK2, DICER1, EPCAM, GREM1, HOXB13, MEN1, MLH1, MSH2, MSH3, MSH6, MUTYH, NBN, NF1, NF2, NTHL1, PALB2, PMS2, POLD1, POLE, PTEN, RAD51C, RAD51D, RECQL, RET, SDHA, SDHAF2, SDHB, SDHC, SDHD, SMAD4, SMARCA4, STK11, TP53, TSC1, TSC2, and VHL.  RNA data is routinely analyzed for use in variant interpretation for all genes.     CANCER STAGING:  Cancer Staging  Rectal cancer [facility] Staging form: Colon and Rectum, AJCC 8th Edition - Clinical stage from 03/28/2022: Stage IVA (cTX, cN1, cM1a) - Unsigned   INTERVAL HISTORY:  Terri Mckinney, a 41 y.o. female, seen for follow-up and toxicity assessment prior to next cycle of chemotherapy.  She reported tingling worse in the fingertips and toes which is constant at this time.  Also reported some dry mouth.  Denies any GI side effects.  REVIEW OF SYSTEMS:  Review of Systems  Neurological:  Positive for numbness (Constant  tingling in the fingertips and toes).  Psychiatric/Behavioral:  Negative for sleep disturbance.   All other systems reviewed and are negative.   PAST MEDICAL/SURGICAL HISTORY:  Past Medical History:  Diagnosis Date   Cancer [facility])    Depression, major, single episode, mild ([place])    Port-A-Cath in place 05/24/2022   Past Surgical History:  Procedure Laterality Date   BIOPSY  03/21/2022   Procedure: BIOPSY;  Surgeon: [name], DO;  Location: [facility];  Service: Endoscopy;;   COLONOSCOPY WITH PROPOFOL N/A 03/21/2022   Procedure: COLONOSCOPY WITH PROPOFOL;  Surgeon: [name], DO;  Location: [facility];  Service: Endoscopy;  Laterality: N/A;  8:30am   FLEXIBLE SIGMOIDOSCOPY N/A 05/19/2022   Procedure: FLEXIBLE SIGMOIDOSCOPY WITH TATTOO INJECTION;  Surgeon: Leighton Ruff, MD;  Location: [facility] ORS;  Service: General;  Laterality: N/A;   LAPAROSCOPIC DIVERTED COLOSTOMY N/A 05/19/2022   Procedure: LAPAROSCOPIC DIVERTED OSTOMY;  Surgeon: Leighton Ruff, MD;  Location: [facility] ORS;  Service: General;  Laterality: N/A;   PORTACATH PLACEMENT Right 05/19/2022   Procedure: INSERTION PORT-A-CATH;  Surgeon: Leighton Ruff, MD;  Location: [facility] ORS;  Service: General;  Laterality: Right;    SOCIAL HISTORY:  Social History   Socioeconomic History   Marital status: Married    Spouse name: Not on file   Number of children: Not on file   Years of education: Not on file   Highest education level: Not on file  Occupational History   Not on file  Tobacco Use   Smoking status: Every Day    Packs/day:  2.00    Years: 27.00    Total pack years: 54.00    Types: Cigarettes   Smokeless tobacco: Never  Vaping Use   Vaping Use: Never used  Substance and Sexual Activity   Alcohol use: No   Drug use: No   Sexual activity: Yes    Birth control/protection: Injection  Other Topics Concern   Not on file  Social History Narrative   Not on file   Social Determinants of Health   Financial  Resource Strain: Not on file  Food Insecurity: Not on file  Transportation Needs: Not on file  Physical Activity: Not on file  Stress: Not on file  Social Connections: Not on file  Intimate Partner Violence: Not on file    FAMILY HISTORY:  Family History  Problem Relation Age of Onset   Heart disease Mother    Diabetes Mother    Hearing loss Father    Heart disease Father    Hyperlipidemia Father    Hypertension Father    Stroke Father    Arthritis Father    Diabetes Father    Learning disabilities Brother    Leukemia Maternal Uncle        d. 60s   Colon cancer Paternal Aunt        dx 32s   Diabetes Maternal Grandmother    Heart disease Maternal Grandmother    Diabetes Maternal Grandfather    Diabetes Paternal Grandmother    Diabetes Paternal Grandfather    Breast cancer Cousin 38    CURRENT MEDICATIONS:  Current Outpatient Medications  Medication Sig Dispense Refill   Bevacizumab (AVASTIN IV) Inject into the vein every 14 (fourteen) days.     Calcium-Cholecalciferol-Zinc 650-20-5.5 MG-MCG-MG CHEW Chew by mouth.     cetirizine (ZYRTEC) 10 MG tablet Take 10 mg by mouth daily.     fluorouracil CALGB 02725 2,400 mg/m2 in sodium chloride 0.9 % 150 mL Inject 2,400 mg/m2 into the vein over 48 hr.     FLUOROURACIL IV Inject into the vein every 14 (fourteen) days.     gabapentin (NEURONTIN) 300 MG capsule Take 1 capsule (300 mg total) by mouth 3 (three) times daily. 30 capsule 3   HYDROcodone-acetaminophen (NORCO) 5-325 MG tablet Take 1 tablet by mouth every 8 (eight) hours as needed for moderate pain. 90 tablet 0   ibuprofen (ADVIL) 200 MG tablet Take 1,000 mg by mouth every 6 (six) hours as needed for moderate pain.     IRINOTECAN HCL IV Inject into the vein every 14 (fourteen) days.     LEUCOVORIN CALCIUM IV Inject into the vein every 14 (fourteen) days.     medroxyPROGESTERone (DEPO-PROVERA) 150 MG/ML injection INJECT 1 ML (150 MG TOTAL) INTO THE MUSCLE EVERY 3 (THREE)  MONTHS 1 mL 3   Multiple Vitamin (MULTIVITAMIN) tablet Take 1 tablet by mouth daily.     OXALIPLATIN IV Inject into the vein every 14 (fourteen) days.     predniSONE (DELTASONE) 20 MG tablet 2 po at sametime daily for 5 days- start tomorrow 10 tablet 0   Current Facility-Administered Medications  Medication Dose Route Frequency Provider Last Rate Last Admin   medroxyPROGESTERone (DEPO-PROVERA) injection 150 mg  150 mg Intramuscular Q90 days Evelina Dun A, FNP   150 mg at 09/21/22 1619    ALLERGIES:  Allergies  Allergen Reactions   Wellbutrin [Bupropion]     insomnia   Tramadol Rash    PHYSICAL EXAM:  Performance status (ECOG): 0 - Asymptomatic  There were no vitals filed for this visit. Wt Readings from Last 3 Encounters:  09/19/22 149 lb (67.6 kg)  09/05/22 146 lb 6.4 oz (66.4 kg)  08/28/22 142 lb 12.8 oz (64.8 kg)   Physical Exam Vitals reviewed.  Constitutional:      Appearance: Normal appearance.  Cardiovascular:     Rate and Rhythm: Normal rate and regular rhythm.     Pulses: Normal pulses.     Heart sounds: Normal heart sounds.  Pulmonary:     Effort: Pulmonary effort is normal.     Breath sounds: Normal breath sounds.  Neurological:     General: No focal deficit present.     Mental Status: She is alert and oriented to person, place, and time.  Psychiatric:        Mood and Affect: Mood normal.        Behavior: Behavior normal.    LABORATORY DATA:  I have reviewed the labs as listed.     Latest Ref Rng & Units 09/19/2022    8:08 AM 09/05/2022    9:19 AM 09/05/2022    9:09 AM  CBC  WBC 4.0 - 10.5 K/uL 6.6  6.5  6.4   Hemoglobin 12.0 - 15.0 g/dL 13.8  12.9  12.7   Hematocrit 36.0 - 46.0 % 40.8  38.1  37.7   Platelets 150 - 400 K/uL 205  183  188       Latest Ref Rng & Units 09/19/2022    8:08 AM 09/05/2022    9:19 AM 09/05/2022    9:09 AM  CMP  Glucose 70 - 99 mg/dL 95  103  105   BUN 6 - 20 mg/dL _0 Creatinine 0.44 - 1.00 mg/dL 0.64   0.58  0.59   Sodium 135 - 145 mmol/L 139  138  137   Potassium 3.5 - 5.1 mmol/L 4.3  4.2  4.2   Chloride 98 - 111 mmol/L 107  107  107   CO2 22 - 32 mmol/L _1 Calcium 8.9 - 10.3 mg/dL 9.5  9.0  8.8   Total Protein 6.5 - 8.1 g/dL 6.9  6.4  6.4   Total Bilirubin 0.3 - 1.2 mg/dL 0.4  0.6  0.7   Alkaline Phos 38 - 126 U/L 58  54  54   AST 15 - 41 U/L _2 ALT 0 - 44 U/L _3 DIAGNOSTIC IMAGING:  I have independently reviewed the scans and discussed with the patient. No results found.   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  Stools have become bloody/mucousy lately. - She also reported pain in the tailbone region since March 2023.  She also has right-sided lower back pain. - 50 pound weight loss in the last 9 months, part of weight loss was intentional.  She cut back on eating sweets and lost taste to sweets after COVID infection. - CT [facility] with contrast on 03/08/2022: Irregular circumferential masslike wall thickening of sigmoid colon/rectum with adjacent perirectal adenopathy.  Multiple small hypodense lesions in the liver, largest 2.9 cm in the central aspect of the liver, question metastatic disease. - Colonoscopy on 03/21/2022 by Dr. Abbey Chatters: Fungating infiltrative nearly completely obstructing mass in the rectosigmoid colon, mass was circumferential measuring 4 cm in length. -  Pathology: Rectal mass biopsy consistent with invasive moderately differentiated adenocarcinoma.  As there is very scant invasive tumor, MSI studies were deferred. - PET scan (03/23/2022): Hypermetabolic rectal primary long-segment with SUV 14.3.  Left posterior perirectal lymph node 7 mm with SUV 2.9.  Multiple tiny foci of hepatic hypermetabolism. - MRI of the liver (04/01/2022): Multiple small hypovascular rim-enhancing liver lesions, predominantly in the right hepatic lobe measuring up to 1.1 cm.   3.3 cm hypervascular mass in the central liver, most consistent with FNH/hepatic adenoma. - Liver biopsy (04/20/2022): Metastatic moderately differentiated colonic adenocarcinoma with mucinous features - NGS testing shows K-ras G12 R mutation, PIK3CA exon 21 mutation.  MS-stable.  TMB-low. - Cycle 1 of FOLFOXIRI on 05/29/2022, bevacizumab added during cycle 2    Social/family history: - She is separated and is seen today with her daughter.  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  She is current active smoker, 1 pack/day for 27 years.  Denies drinking alcohol. - Paternal aunt had colon cancer.  Maternal cousin has breast cancer.  Maternal uncle had leukemia and maternal cousin had leukemia.   PLAN:  Metastatic rectal cancer to the liver: - CTAP on 08/14/2022 showed interval improvement in the wall thickening involving the rectum.  Improved perirectal adenopathy.  Numerous small right hepatic lobe lesions, grossly stable.  Hepatomegaly was seen.  Reviewed labs today which showed normal LFTs.  CBC was grossly normal.  Last CEA was 3.0.  She will proceed with next cycle of chemotherapy today.  She is seeing Dr. Marcello Moores on 10/30/2022.  We will discontinue oxaliplatin due to neuropathy today.  We will hold Avastin in 2 weeks in preparation for surgery.  RTC 10/31/2022.  2.  Sacral/right lower back pain: - Pain has completely resolved and she did not require any narcotics in the last 3 months.  3.  Peripheral neuropathy: - She has constantly angling in the fingertips and toes.  I will discontinue oxaliplatin.   Orders placed this encounter:  No orders of the defined types were placed in this encounter.     Derek Jack, MD [place] [phone]

## 2022-10-04 ENCOUNTER — Inpatient Hospital Stay: Payer: BC Managed Care – PPO | Attending: Hematology

## 2022-10-04 VITALS — BP 118/65 | HR 92 | Temp 98.1°F | Resp 18

## 2022-10-04 DIAGNOSIS — C19 Malignant neoplasm of rectosigmoid junction: Secondary | ICD-10-CM | POA: Insufficient documentation

## 2022-10-04 DIAGNOSIS — Z95828 Presence of other vascular implants and grafts: Secondary | ICD-10-CM

## 2022-10-04 DIAGNOSIS — C20 Malignant neoplasm of rectum: Secondary | ICD-10-CM

## 2022-10-04 DIAGNOSIS — Z5111 Encounter for antineoplastic chemotherapy: Secondary | ICD-10-CM | POA: Diagnosis not present

## 2022-10-04 DIAGNOSIS — C787 Secondary malignant neoplasm of liver and intrahepatic bile duct: Secondary | ICD-10-CM | POA: Diagnosis not present

## 2022-10-04 DIAGNOSIS — G629 Polyneuropathy, unspecified: Secondary | ICD-10-CM | POA: Insufficient documentation

## 2022-10-04 LAB — CEA: CEA: 3.4 ng/mL (ref 0.0–4.7)

## 2022-10-04 MED ORDER — HEPARIN SOD (PORK) LOCK FLUSH 100 UNIT/ML IV SOLN
500.0000 [IU] | Freq: Once | INTRAVENOUS | Status: AC | PRN
  Administered 2022-10-04: 500 [IU]

## 2022-10-04 MED ORDER — SODIUM CHLORIDE 0.9% FLUSH
10.0000 mL | INTRAVENOUS | Status: DC | PRN
  Administered 2022-10-04: 10 mL

## 2022-10-04 NOTE — Patient Instructions (Signed)
[facility]  Discharge Instructions: Thank you for choosing [facility] to provide your oncology and hematology care.  If you have a lab appointment with the [facility], please come in thru the Main Entrance and check in at the main information desk.  Wear comfortable clothing and clothing appropriate for easy access to any Portacath or PICC line.   We strive to give you quality time with your provider. You may need to reschedule your appointment if you arrive late (15 or more minutes).  Arriving late affects you and other patients whose appointments are after yours.  Also, if you miss three or more appointments without notifying the office, you may be dismissed from the clinic at the provider's discretion.      For prescription refill requests, have your pharmacy contact our office and allow 72 hours for refills to be completed.    Today you had your ambulatory pump disconnected.    To help prevent nausea and vomiting after your treatment, we encourage you to take your nausea medication as directed.  BELOW ARE SYMPTOMS THAT SHOULD BE REPORTED IMMEDIATELY: *FEVER GREATER THAN 100.4 F (38 C) OR HIGHER *CHILLS OR SWEATING *NAUSEA AND VOMITING THAT IS NOT CONTROLLED WITH YOUR NAUSEA MEDICATION *UNUSUAL SHORTNESS OF BREATH *UNUSUAL BRUISING OR BLEEDING *URINARY PROBLEMS (pain or burning when urinating, or frequent urination) *BOWEL PROBLEMS (unusual diarrhea, constipation, pain near the anus) TENDERNESS IN MOUTH AND THROAT WITH OR WITHOUT PRESENCE OF ULCERS (sore throat, sores in mouth, or a toothache) UNUSUAL RASH, SWELLING OR PAIN  UNUSUAL VAGINAL DISCHARGE OR ITCHING   Items with * indicate a potential emergency and should be followed up as soon as possible or go to the Emergency Department if any problems should occur.  Please show the CHEMOTHERAPY ALERT CARD or IMMUNOTHERAPY ALERT CARD at check-in to the Emergency Department and triage  nurse.  Should you have questions after your visit or need to cancel or reschedule your appointment, please contact [facility] [phone]  and follow the prompts.  Office hours are 8:00 a.m. to 4:30 p.m. Monday - Friday. Please note that voicemails left after 4:00 p.m. may not be returned until the following business day.  We are closed weekends and major holidays. You have access to a nurse at all times for urgent questions. Please call the main number to the clinic [phone] and follow the prompts.  For any non-urgent questions, you may also contact your provider using MyChart. We now offer e-Visits for anyone 76 and older to request care online for non-urgent symptoms. For details visit [email or web address].   Also download the MyChart app! Go to the app store, search "MyChart", open the app, select [facility], and log in with your MyChart username and password.  Masks are optional in the cancer centers. If you would like for your care team to wear a mask while they are taking care of you, please let them know. You may have one support person who is at least 43 years old accompany you for your appointments.

## 2022-10-04 NOTE — Progress Notes (Signed)
Terri Mckinney presents to have home infusion pump d/c'd and for port-a-cath deaccess with flush.  Portacath located left chest wall accessed with  H 20 needle.  No blood return and no resistance met.  Portacath flushed with NS and 500U/38m Heparin, and needle removed intact.  Procedure tolerated well and without incident.  Vitals stable and discharged home from clinic ambulatory. Follow up as scheduled.

## 2022-10-13 ENCOUNTER — Other Ambulatory Visit: Payer: Self-pay

## 2022-10-13 DIAGNOSIS — C20 Malignant neoplasm of rectum: Secondary | ICD-10-CM

## 2022-10-13 NOTE — Progress Notes (Signed)
In basket received from Dr. Delton Coombes- Please order MRI of the pelvis (rectal cancer protocol).  Please let the patient know that this is per my discussion with Dr. Marcello Moores.  Thanks.

## 2022-10-16 ENCOUNTER — Inpatient Hospital Stay: Payer: BC Managed Care – PPO

## 2022-10-16 ENCOUNTER — Inpatient Hospital Stay: Payer: BC Managed Care – PPO | Admitting: Hematology

## 2022-10-16 VITALS — BP 120/75 | HR 78 | Temp 98.1°F | Resp 18

## 2022-10-16 DIAGNOSIS — C20 Malignant neoplasm of rectum: Secondary | ICD-10-CM

## 2022-10-16 DIAGNOSIS — Z5111 Encounter for antineoplastic chemotherapy: Secondary | ICD-10-CM | POA: Diagnosis not present

## 2022-10-16 DIAGNOSIS — Z95828 Presence of other vascular implants and grafts: Secondary | ICD-10-CM

## 2022-10-16 LAB — COMPREHENSIVE METABOLIC PANEL
ALT: 18 U/L (ref 0–44)
AST: 17 U/L (ref 15–41)
Albumin: 3.7 g/dL (ref 3.5–5.0)
Alkaline Phosphatase: 60 U/L (ref 38–126)
Anion gap: 7 (ref 5–15)
BUN: 9 mg/dL (ref 6–20)
CO2: 23 mmol/L (ref 22–32)
Calcium: 9.2 mg/dL (ref 8.9–10.3)
Chloride: 108 mmol/L (ref 98–111)
Creatinine, Ser: 0.67 mg/dL (ref 0.44–1.00)
GFR, Estimated: >60 mL/min (ref >60)
Glucose, Bld: 94 mg/dL (ref 70–99)
Potassium: 4.1 mmol/L (ref 3.5–5.1)
Sodium: 138 mmol/L (ref 135–145)
Total Bilirubin: 0.4 mg/dL (ref 0.3–1.2)
Total Protein: 6.9 g/dL (ref 6.5–8.1)

## 2022-10-16 LAB — CBC WITH DIFFERENTIAL/PLATELET
Abs Immature Granulocytes: 0.01 10*3/uL (ref 0.00–0.07)
Basophils Absolute: 0.1 10*3/uL (ref 0.0–0.1)
Basophils Relative: 1 %
Eosinophils Absolute: 0.4 10*3/uL (ref 0.0–0.5)
Eosinophils Relative: 5 %
HCT: 40.9 % (ref 36.0–46.0)
Hemoglobin: 13.8 g/dL (ref 12.0–15.0)
Immature Granulocytes: 0 %
Lymphocytes Relative: 30 %
Lymphs Abs: 2.4 10*3/uL (ref 0.7–4.0)
MCH: 34.7 pg — ABNORMAL HIGH (ref 26.0–34.0)
MCHC: 33.7 g/dL (ref 30.0–36.0)
MCV: 102.8 fL — ABNORMAL HIGH (ref 80.0–100.0)
Monocytes Absolute: 0.6 10*3/uL (ref 0.1–1.0)
Monocytes Relative: 8 %
Neutro Abs: 4.6 10*3/uL (ref 1.7–7.7)
Neutrophils Relative %: 56 %
Platelets: 215 10*3/uL (ref 150–400)
RBC: 3.98 MIL/uL (ref 3.87–5.11)
RDW: 14.2 % (ref 11.5–15.5)
WBC: 8.1 10*3/uL (ref 4.0–10.5)
nRBC: 0 % (ref 0.0–0.2)

## 2022-10-16 LAB — MAGNESIUM: Magnesium: 2 mg/dL (ref 1.7–2.4)

## 2022-10-16 MED ORDER — SODIUM CHLORIDE 0.9 % IV SOLN
165.0000 mg/m2 | Freq: Once | INTRAVENOUS | Status: AC
  Administered 2022-10-16: 280 mg via INTRAVENOUS
  Filled 2022-10-16: qty 10

## 2022-10-16 MED ORDER — DEXTROSE 5 % IV SOLN: Freq: Once | INTRAVENOUS | Status: DC

## 2022-10-16 MED ORDER — PALONOSETRON HCL INJECTION 0.25 MG/5ML
0.2500 mg | Freq: Once | INTRAVENOUS | Status: AC
  Administered 2022-10-16: 0.25 mg via INTRAVENOUS
  Filled 2022-10-16: qty 5

## 2022-10-16 MED ORDER — ATROPINE SULFATE 1 MG/ML IV SOLN
0.5000 mg | Freq: Once | INTRAVENOUS | Status: AC
  Administered 2022-10-16: 0.5 mg via INTRAVENOUS
  Filled 2022-10-16: qty 1

## 2022-10-16 MED ORDER — SODIUM CHLORIDE 0.9 % IV SOLN: Freq: Once | INTRAVENOUS | Status: AC

## 2022-10-16 MED ORDER — SODIUM CHLORIDE 0.9 % IV SOLN
10.0000 mg | Freq: Once | INTRAVENOUS | Status: AC
  Administered 2022-10-16: 10 mg via INTRAVENOUS
  Filled 2022-10-16: qty 10

## 2022-10-16 MED ORDER — SODIUM CHLORIDE 0.9% FLUSH
10.0000 mL | INTRAVENOUS | Status: DC | PRN
  Administered 2022-10-16: 10 mL via INTRAVENOUS

## 2022-10-16 MED ORDER — SODIUM CHLORIDE 0.9 % IV SOLN
150.0000 mg | Freq: Once | INTRAVENOUS | Status: AC
  Administered 2022-10-16: 150 mg via INTRAVENOUS
  Filled 2022-10-16: qty 150

## 2022-10-16 MED ORDER — SODIUM CHLORIDE 0.9 % IV SOLN
400.0000 mg/m2 | Freq: Once | INTRAVENOUS | Status: AC
  Administered 2022-10-16: 692 mg via INTRAVENOUS
  Filled 2022-10-16: qty 34.6

## 2022-10-16 MED ORDER — SODIUM CHLORIDE 0.9 % IV SOLN
2400.0000 mg/m2 | INTRAVENOUS | Status: DC
  Administered 2022-10-16: 4150 mg via INTRAVENOUS
  Filled 2022-10-16: qty 83

## 2022-10-16 NOTE — Patient Instructions (Signed)
[facility]  Discharge Instructions: Thank you for choosing [facility] to provide your oncology and hematology care.  If you have a lab appointment with the [facility], please come in thru the Main Entrance and check in at the main information desk.  Wear comfortable clothing and clothing appropriate for easy access to any Portacath or PICC line.   We strive to give you quality time with your provider. You may need to reschedule your appointment if you arrive late (15 or more minutes).  Arriving late affects you and other patients whose appointments are after yours.  Also, if you miss three or more appointments without notifying the office, you may be dismissed from the clinic at the provider's discretion.      For prescription refill requests, have your pharmacy contact our office and allow 72 hours for refills to be completed.    Today you received the following chemotherapy and/or immunotherapy agents Leucovorin/Irinotecan/Fluorouracil.  Fluorouracil Injection What is this medication? FLUOROURACIL (flure oh YOOR a sil) treats some types of cancer. It works by slowing down the growth of cancer cells. This medicine may be used for other purposes; ask your health care provider or pharmacist if you have questions. COMMON BRAND NAME(S): Adrucil What should I tell my care team before I take this medication? They need to know if you have any of these conditions: Blood disorders Dihydropyrimidine dehydrogenase (DPD) deficiency Infection, such as chickenpox, cold sores, herpes Kidney disease Liver disease Poor nutrition Recent or ongoing radiation therapy An unusual or allergic reaction to fluorouracil, other medications, foods, dyes, or preservatives If you or your partner are pregnant or trying to get pregnant Breast-feeding How should I use this medication? This medication is injected into a vein. It is administered by your care team in a hospital or  clinic setting. Talk to your care team about the use of this medication in children. Special care may be needed. Overdosage: If you think you have taken too much of this medicine contact a poison control center or emergency room at once. NOTE: This medicine is only for you. Do not share this medicine with others. What if I miss a dose? Keep appointments for follow-up doses. It is important not to miss your dose. Call your care team if you are unable to keep an appointment. What may interact with this medication? Do not take this medication with any of the following: Live virus vaccines This medication may also interact with the following: Medications that treat or prevent blood clots, such as warfarin, enoxaparin, dalteparin This list may not describe all possible interactions. Give your health care provider a list of all the medicines, herbs, non-prescription drugs, or dietary supplements you use. Also tell them if you smoke, drink alcohol, or use illegal drugs. Some items may interact with your medicine. What should I watch for while using this medication? Your condition will be monitored carefully while you are receiving this medication. This medication may make you feel generally unwell. This is not uncommon as chemotherapy can affect healthy cells as well as cancer cells. Report any side effects. Continue your course of treatment even though you feel ill unless your care team tells you to stop. In some cases, you may be given additional medications to help with side effects. Follow all directions for their use. This medication may increase your risk of getting an infection. Call your care team for advice if you get a fever, chills, sore throat, or other symptoms  of a cold or flu. Do not treat yourself. Try to avoid being around people who are sick. This medication may increase your risk to bruise or bleed. Call your care team if you notice any unusual bleeding. Be careful brushing or flossing  your teeth or using a toothpick because you may get an infection or bleed more easily. If you have any dental work done, tell your dentist you are receiving this medication. Avoid taking medications that contain aspirin, acetaminophen, ibuprofen, naproxen, or ketoprofen unless instructed by your care team. These medications may hide a fever. Do not treat diarrhea with over the counter products. Contact your care team if you have diarrhea that lasts more than 2 days or if it is severe and watery. This medication can make you more sensitive to the sun. Keep out of the sun. If you cannot avoid being in the sun, wear protective clothing and sunscreen. Do not use sun lamps, tanning beds, or tanning booths. Talk to your care team if you or your partner wish to become pregnant or think you might be pregnant. This medication can cause serious birth defects if taken during pregnancy and for 3 months after the last dose. A reliable form of contraception is recommended while taking this medication and for 3 months after the last dose. Talk to your care team about effective forms of contraception. Do not father a child while taking this medication and for 3 months after the last dose. Use a condom while having sex during this time period. Do not breastfeed while taking this medication. This medication may cause infertility. Talk to your care team if you are concerned about your fertility. What side effects may I notice from receiving this medication? Side effects that you should report to your care team as soon as possible: Allergic reactions--skin rash, itching, hives, swelling of the face, lips, tongue, or throat Heart attack--pain or tightness in the chest, shoulders, arms, or jaw, nausea, shortness of breath, cold or clammy skin, feeling faint or lightheaded Heart failure--shortness of breath, swelling of the ankles, feet, or hands, sudden weight gain, unusual weakness or fatigue Heart rhythm changes--fast or  irregular heartbeat, dizziness, feeling faint or lightheaded, chest pain, trouble breathing High ammonia level--unusual weakness or fatigue, confusion, loss of appetite, nausea, vomiting, seizures Infection--fever, chills, cough, sore throat, wounds that don't heal, pain or trouble when passing urine, general feeling of discomfort or being unwell Low red blood cell level--unusual weakness or fatigue, dizziness, headache, trouble breathing Pain, tingling, or numbness in the hands or feet, muscle weakness, change in vision, confusion or trouble speaking, loss of balance or coordination, trouble walking, seizures Redness, swelling, and blistering of the skin over hands and feet Severe or prolonged diarrhea Unusual bruising or bleeding Side effects that usually do not require medical attention (report to your care team if they continue or are bothersome): Dry skin Headache Increased tears Nausea Pain, redness, or swelling with sores inside the mouth or throat Sensitivity to light Vomiting This list may not describe all possible side effects. Call your doctor for medical advice about side effects. You may report side effects to FDA at 1-800-FDA-1088. Where should I keep my medication? This medication is given in a hospital or clinic. It will not be stored at home. NOTE: This sheet is a summary. It may not cover all possible information. If you have questions about this medicine, talk to your doctor, pharmacist, or health care provider.  2023 Elsevier/Gold Standard (2022-03-21 00:00:00)    Irinotecan  Injection What is this medication? IRINOTECAN (ir in oh TEE kan) treats some types of cancer. It works by slowing down the growth of cancer cells. This medicine may be used for other purposes; ask your health care provider or pharmacist if you have questions. COMMON BRAND NAME(S): Camptosar What should I tell my care team before I take this medication? They need to know if you have any of these  conditions: Dehydration Diarrhea Infection, especially a viral infection, such as chickenpox, cold sores, herpes Liver disease Low blood cell levels (white cells, red cells, and platelets) Low levels of electrolytes, such as calcium, magnesium, or potassium in your blood Recent or ongoing radiation An unusual or allergic reaction to irinotecan, other medications, foods, dyes, or preservatives If you or your partner are pregnant or trying to get pregnant Breast-feeding How should I use this medication? This medication is injected into a vein. It is given by your care team in a hospital or clinic setting. Talk to your care team about the use of this medication in children. Special care may be needed. Overdosage: If you think you have taken too much of this medicine contact a poison control center or emergency room at once. NOTE: This medicine is only for you. Do not share this medicine with others. What if I miss a dose? Keep appointments for follow-up doses. It is important not to miss your dose. Call your care team if you are unable to keep an appointment. What may interact with this medication? Do not take this medication with any of the following: Cobicistat Itraconazole This medication may also interact with the following: Certain antibiotics, such as clarithromycin, rifampin, rifabutin Certain antivirals for HIV or AIDS Certain medications for fungal infections, such as ketoconazole, posaconazole, voriconazole Certain medications for seizures, such as carbamazepine, phenobarbital, phenytoin Gemfibrozil Nefazodone St. John's wort This list may not describe all possible interactions. Give your health care provider a list of all the medicines, herbs, non-prescription drugs, or dietary supplements you use. Also tell them if you smoke, drink alcohol, or use illegal drugs. Some items may interact with your medicine. What should I watch for while using this medication? Your condition  will be monitored carefully while you are receiving this medication. You may need blood work while taking this medication. This medication may make you feel generally unwell. This is not uncommon as chemotherapy can affect healthy cells as well as cancer cells. Report any side effects. Continue your course of treatment even though you feel ill unless your care team tells you to stop. This medication can cause serious side effects. To reduce the risk, your care team may give you other medications to take before receiving this one. Be sure to follow the directions from your care team. This medication may affect your coordination, reaction time, or judgement. Do not drive or operate machinery until you know how this medication affects you. Sit up or stand slowly to reduce the risk of dizzy or fainting spells. Drinking alcohol with this medication can increase the risk of these side effects. This medication may increase your risk of getting an infection. Call your care team for advice if you get a fever, chills, sore throat, or other symptoms of a cold or flu. Do not treat yourself. Try to avoid being around people who are sick. Avoid taking medications that contain aspirin, acetaminophen, ibuprofen, naproxen, or ketoprofen unless instructed by your care team. These medications may hide a fever. This medication may increase your risk to bruise or  bleed. Call your care team if you notice any unusual bleeding. Be careful brushing or flossing your teeth or using a toothpick because you may get an infection or bleed more easily. If you have any dental work done, tell your dentist you are receiving this medication. Talk to your care team if you or your partner are pregnant or think either of you might be pregnant. This medication can cause serious birth defects if taken during pregnancy and for 6 months after the last dose. You will need a negative pregnancy test before starting this medication. Contraception is  recommended while taking this medication and for 6 months after the last dose. Your care team can help you find the option that works for you. Do not father a child while taking this medication and for 3 months after the last dose. Use a condom for contraception during this time period. Do not breastfeed while taking this medication and for 7 days after the last dose. This medication may cause infertility. Talk to your care team if you are concerned about your fertility. What side effects may I notice from receiving this medication? Side effects that you should report to your care team as soon as possible: Allergic reactions--skin rash, itching, hives, swelling of the face, lips, tongue, or throat Dry cough, shortness of breath or trouble breathing Increased saliva or tears, increased sweating, stomach cramping, diarrhea, small pupils, unusual weakness or fatigue, slow heartbeat Infection--fever, chills, cough, sore throat, wounds that don't heal, pain or trouble when passing urine, general feeling of discomfort or being unwell Kidney injury--decrease in the amount of urine, swelling of the ankles, hands, or feet Low red blood cell level--unusual weakness or fatigue, dizziness, headache, trouble breathing Severe or prolonged diarrhea Unusual bruising or bleeding Side effects that usually do not require medical attention (report to your care team if they continue or are bothersome): Constipation Diarrhea Hair loss Loss of appetite Nausea Stomach pain This list may not describe all possible side effects. Call your doctor for medical advice about side effects. You may report side effects to FDA at 1-800-FDA-1088. Where should I keep my medication? This medication is given in a hospital or clinic. It will not be stored at home. NOTE: This sheet is a summary. It may not cover all possible information. If you have questions about this medicine, talk to your doctor, pharmacist, or health care  provider.  2023 Elsevier/Gold Standard (2022-03-30 00:00:00)    Leucovorin Injection What is this medication? LEUCOVORIN (loo koe VOR in) prevents side effects from certain medications, such as methotrexate. It works by increasing folate levels. This helps protect healthy cells in your body. It may also be used to treat anemia caused by low levels of folate. It can also be used with fluorouracil, a type of chemotherapy, to treat colorectal cancer. It works by increasing the effects of fluorouracil in the body. This medicine may be used for other purposes; ask your health care provider or pharmacist if you have questions. What should I tell my care team before I take this medication? They need to know if you have any of these conditions: Anemia from low levels of vitamin B12 in the blood An unusual or allergic reaction to leucovorin, folic acid, other medications, foods, dyes, or preservatives Pregnant or trying to get pregnant Breastfeeding How should I use this medication? This medication is injected into a vein or a muscle. It is given by your care team in a hospital or clinic setting. Talk to your  care team about the use of this medication in children. Special care may be needed. Overdosage: If you think you have taken too much of this medicine contact a poison control center or emergency room at once. NOTE: This medicine is only for you. Do not share this medicine with others. What if I miss a dose? Keep appointments for follow-up doses. It is important not to miss your dose. Call your care team if you are unable to keep an appointment. What may interact with this medication? Capecitabine Fluorouracil Phenobarbital Phenytoin Primidone Trimethoprim;sulfamethoxazole This list may not describe all possible interactions. Give your health care provider a list of all the medicines, herbs, non-prescription drugs, or dietary supplements you use. Also tell them if you smoke, drink alcohol,  or use illegal drugs. Some items may interact with your medicine. What should I watch for while using this medication? Your condition will be monitored carefully while you are receiving this medication. This medication may increase the side effects of 5-fluorouracil. Tell your care team if you have diarrhea or mouth sores that do not get better or that get worse. What side effects may I notice from receiving this medication? Side effects that you should report to your care team as soon as possible: Allergic reactions--skin rash, itching, hives, swelling of the face, lips, tongue, or throat This list may not describe all possible side effects. Call your doctor for medical advice about side effects. You may report side effects to FDA at 1-800-FDA-1088. Where should I keep my medication? This medication is given in a hospital or clinic. It will not be stored at home. NOTE: This sheet is a summary. It may not cover all possible information. If you have questions about this medicine, talk to your doctor, pharmacist, or health care provider.  2023 Elsevier/Gold Standard (2022-04-25 00:00:00)        To help prevent nausea and vomiting after your treatment, we encourage you to take your nausea medication as directed.  BELOW ARE SYMPTOMS THAT SHOULD BE REPORTED IMMEDIATELY: *FEVER GREATER THAN 100.4 F (38 C) OR HIGHER *CHILLS OR SWEATING *NAUSEA AND VOMITING THAT IS NOT CONTROLLED WITH YOUR NAUSEA MEDICATION *UNUSUAL SHORTNESS OF BREATH *UNUSUAL BRUISING OR BLEEDING *URINARY PROBLEMS (pain or burning when urinating, or frequent urination) *BOWEL PROBLEMS (unusual diarrhea, constipation, pain near the anus) TENDERNESS IN MOUTH AND THROAT WITH OR WITHOUT PRESENCE OF ULCERS (sore throat, sores in mouth, or a toothache) UNUSUAL RASH, SWELLING OR PAIN  UNUSUAL VAGINAL DISCHARGE OR ITCHING   Items with * indicate a potential emergency and should be followed up as soon as possible or go to the  Emergency Department if any problems should occur.  Please show the CHEMOTHERAPY ALERT CARD or IMMUNOTHERAPY ALERT CARD at check-in to the Emergency Department and triage nurse.  Should you have questions after your visit or need to cancel or reschedule your appointment, please contact [facility] [phone]  and follow the prompts.  Office hours are 8:00 a.m. to 4:30 p.m. Monday - Friday. Please note that voicemails left after 4:00 p.m. may not be returned until the following business day.  We are closed weekends and major holidays. You have access to a nurse at all times for urgent questions. Please call the main number to the clinic [phone] and follow the prompts.  For any non-urgent questions, you may also contact your provider using MyChart. We now offer e-Visits for anyone 10 and older to request care online for non-urgent symptoms. For details visit  [email or web address].   Also download the MyChart app! Go to the app store, search "MyChart", open the app, select [facility], and log in with your MyChart username and password.  Masks are optional in the cancer centers. If you would like for your care team to wear a mask while they are taking care of you, please let them know. You may have one support person who is at least 43 years old accompany you for your appointments.

## 2022-10-16 NOTE — Progress Notes (Signed)
Patients port flushed without difficulty.  Good blood return noted with no bruising or swelling noted at site.  Patient remains accessed for chemotherapy treatment.  

## 2022-10-16 NOTE — Progress Notes (Signed)
Patient presents today for chemotherapy infusion.  Patient is in satisfactory condition with no complaints voiced.  Vital signs are stable.  Labs reviewed.  All labs are within treatment parameters.  We will proceed with treatment per MD orders.   Patient tolerated treatment well with no complaints voiced.  Home infusion 5FU pump connected.  Patient left ambulatory in stable condition.  Vital signs stable at discharge.  Follow up as scheduled.

## 2022-10-17 LAB — CEA: CEA: 3.7 ng/mL (ref 0.0–4.7)

## 2022-10-18 ENCOUNTER — Inpatient Hospital Stay: Payer: BC Managed Care – PPO

## 2022-10-18 VITALS — BP 131/79 | HR 91 | Temp 97.8°F | Resp 18

## 2022-10-18 DIAGNOSIS — Z5111 Encounter for antineoplastic chemotherapy: Secondary | ICD-10-CM | POA: Diagnosis not present

## 2022-10-18 DIAGNOSIS — C20 Malignant neoplasm of rectum: Secondary | ICD-10-CM

## 2022-10-18 DIAGNOSIS — Z95828 Presence of other vascular implants and grafts: Secondary | ICD-10-CM

## 2022-10-18 MED ORDER — HEPARIN SOD (PORK) LOCK FLUSH 100 UNIT/ML IV SOLN
500.0000 [IU] | Freq: Once | INTRAVENOUS | Status: AC | PRN
  Administered 2022-10-18: 500 [IU]

## 2022-10-18 MED ORDER — SODIUM CHLORIDE 0.9% FLUSH
10.0000 mL | INTRAVENOUS | Status: DC | PRN
  Administered 2022-10-18: 10 mL

## 2022-10-18 NOTE — Progress Notes (Signed)
Patients port flushed without difficulty.  Good blood return noted with no bruising or swelling noted at site.  Band aid applied.  Home infusion 5FU pump disconnected.  VSS with discharge and left in satisfactory condition with no s/s of distress noted.

## 2022-10-18 NOTE — Patient Instructions (Signed)
[facility]  Discharge Instructions: Thank you for choosing [facility] to provide your oncology and hematology care.  If you have a lab appointment with the Cancer Center, please come in thru the Main Entrance and check in at the main information desk.  Wear comfortable clothing and clothing appropriate for easy access to any Portacath or PICC line.   We strive to give you quality time with your provider. You may need to reschedule your appointment if you arrive late (15 or more minutes).  Arriving late affects you and other patients whose appointments are after yours.  Also, if you miss three or more appointments without notifying the office, you may be dismissed from the clinic at the provider's discretion.      For prescription refill requests, have your pharmacy contact our office and allow 72 hours for refills to be completed.     To help prevent nausea and vomiting after your treatment, we encourage you to take your nausea medication as directed.  BELOW ARE SYMPTOMS THAT SHOULD BE REPORTED IMMEDIATELY: *FEVER GREATER THAN 100.4 F (38 C) OR HIGHER *CHILLS OR SWEATING *NAUSEA AND VOMITING THAT IS NOT CONTROLLED WITH YOUR NAUSEA MEDICATION *UNUSUAL SHORTNESS OF BREATH *UNUSUAL BRUISING OR BLEEDING *URINARY PROBLEMS (pain or burning when urinating, or frequent urination) *BOWEL PROBLEMS (unusual diarrhea, constipation, pain near the anus) TENDERNESS IN MOUTH AND THROAT WITH OR WITHOUT PRESENCE OF ULCERS (sore throat, sores in mouth, or a toothache) UNUSUAL RASH, SWELLING OR PAIN  UNUSUAL VAGINAL DISCHARGE OR ITCHING   Items with * indicate a potential emergency and should be followed up as soon as possible or go to the Emergency Department if any problems should occur.  Please show the CHEMOTHERAPY ALERT CARD or IMMUNOTHERAPY ALERT CARD at check-in to the Emergency Department and triage nurse.  Should you have questions after your visit or need to  cancel or reschedule your appointment, please contact [facility] [phone]  and follow the prompts.  Office hours are 8:00 a.m. to 4:30 p.m. Monday - Friday. Please note that voicemails left after 4:00 p.m. may not be returned until the following business day.  We are closed weekends and major holidays. You have access to a nurse at all times for urgent questions. Please call the main number to the clinic [phone] and follow the prompts.  For any non-urgent questions, you may also contact your provider using MyChart. We now offer e-Visits for anyone 18 and older to request care online for non-urgent symptoms. For details visit [email or web address].   Also download the MyChart app! Go to the app store, search "MyChart", open the app, select [facility], and log in with your MyChart username and password.  Masks are optional in the cancer centers. If you would like for your care team to wear a mask while they are taking care of you, please let them know. You may have one support person who is at least 43 years old accompany you for your appointments.  

## 2022-10-23 ENCOUNTER — Ambulatory Visit (HOSPITAL_COMMUNITY)
Admission: RE | Admit: 2022-10-23 | Discharge: 2022-10-23 | Disposition: A | Discharge status: Home / Self Care | Payer: BC Managed Care – PPO | Source: Ambulatory Visit | Attending: Hematology | Admitting: Hematology

## 2022-10-23 ENCOUNTER — Other Ambulatory Visit: Payer: Self-pay

## 2022-10-23 DIAGNOSIS — C20 Malignant neoplasm of rectum: Secondary | ICD-10-CM | POA: Diagnosis present

## 2022-10-23 DIAGNOSIS — C787 Secondary malignant neoplasm of liver and intrahepatic bile duct: Secondary | ICD-10-CM | POA: Insufficient documentation

## 2022-10-30 ENCOUNTER — Inpatient Hospital Stay: Payer: BC Managed Care – PPO

## 2022-10-30 ENCOUNTER — Inpatient Hospital Stay: Payer: BC Managed Care – PPO | Admitting: Hematology

## 2022-10-31 ENCOUNTER — Inpatient Hospital Stay (HOSPITAL_BASED_OUTPATIENT_CLINIC_OR_DEPARTMENT_OTHER): Payer: BC Managed Care – PPO | Admitting: Hematology

## 2022-10-31 ENCOUNTER — Inpatient Hospital Stay: Payer: BC Managed Care – PPO

## 2022-10-31 VITALS — BP 115/73 | HR 85 | Temp 98.2°F | Resp 18

## 2022-10-31 DIAGNOSIS — C20 Malignant neoplasm of rectum: Secondary | ICD-10-CM

## 2022-10-31 DIAGNOSIS — Z95828 Presence of other vascular implants and grafts: Secondary | ICD-10-CM

## 2022-10-31 DIAGNOSIS — Z5111 Encounter for antineoplastic chemotherapy: Secondary | ICD-10-CM | POA: Diagnosis not present

## 2022-10-31 LAB — COMPREHENSIVE METABOLIC PANEL
ALT: 20 U/L (ref 0–44)
AST: 18 U/L (ref 15–41)
Albumin: 3.8 g/dL (ref 3.5–5.0)
Alkaline Phosphatase: 53 U/L (ref 38–126)
Anion gap: 7 (ref 5–15)
BUN: 10 mg/dL (ref 6–20)
CO2: 23 mmol/L (ref 22–32)
Calcium: 9 mg/dL (ref 8.9–10.3)
Chloride: 107 mmol/L (ref 98–111)
Creatinine, Ser: 0.69 mg/dL (ref 0.44–1.00)
GFR, Estimated: >60 mL/min (ref >60)
Glucose, Bld: 113 mg/dL — ABNORMAL HIGH (ref 70–99)
Potassium: 4 mmol/L (ref 3.5–5.1)
Sodium: 137 mmol/L (ref 135–145)
Total Bilirubin: 0.4 mg/dL (ref 0.3–1.2)
Total Protein: 6.7 g/dL (ref 6.5–8.1)

## 2022-10-31 LAB — CBC WITH DIFFERENTIAL/PLATELET
Abs Immature Granulocytes: 0.03 10*3/uL (ref 0.00–0.07)
Basophils Absolute: 0.1 10*3/uL (ref 0.0–0.1)
Basophils Relative: 1 %
Eosinophils Absolute: 0.3 10*3/uL (ref 0.0–0.5)
Eosinophils Relative: 3 %
HCT: 42.7 % (ref 36.0–46.0)
Hemoglobin: 14.4 g/dL (ref 12.0–15.0)
Immature Granulocytes: 0 %
Lymphocytes Relative: 29 %
Lymphs Abs: 2.3 10*3/uL (ref 0.7–4.0)
MCH: 34.9 pg — ABNORMAL HIGH (ref 26.0–34.0)
MCHC: 33.7 g/dL (ref 30.0–36.0)
MCV: 103.4 fL — ABNORMAL HIGH (ref 80.0–100.0)
Monocytes Absolute: 0.6 10*3/uL (ref 0.1–1.0)
Monocytes Relative: 8 %
Neutro Abs: 4.8 10*3/uL (ref 1.7–7.7)
Neutrophils Relative %: 59 %
Platelets: 236 10*3/uL (ref 150–400)
RBC: 4.13 MIL/uL (ref 3.87–5.11)
RDW: 13.9 % (ref 11.5–15.5)
WBC: 8.1 10*3/uL (ref 4.0–10.5)
nRBC: 0 % (ref 0.0–0.2)

## 2022-10-31 LAB — URINALYSIS, DIPSTICK ONLY
Bilirubin Urine: NEGATIVE
Glucose, UA: NEGATIVE mg/dL
Hgb urine dipstick: NEGATIVE
Ketones, ur: 5 mg/dL — AB
Leukocytes,Ua: NEGATIVE
Nitrite: NEGATIVE
Protein, ur: NEGATIVE mg/dL
Specific Gravity, Urine: 1.02 (ref 1.005–1.030)
pH: 5 (ref 5.0–8.0)

## 2022-10-31 LAB — MAGNESIUM: Magnesium: 2.1 mg/dL (ref 1.7–2.4)

## 2022-10-31 MED ORDER — SODIUM CHLORIDE 0.9 % IV SOLN
10.0000 mg | Freq: Once | INTRAVENOUS | Status: AC
  Administered 2022-10-31: 10 mg via INTRAVENOUS
  Filled 2022-10-31: qty 10

## 2022-10-31 MED ORDER — PALONOSETRON HCL INJECTION 0.25 MG/5ML
0.2500 mg | Freq: Once | INTRAVENOUS | Status: AC
  Administered 2022-10-31: 0.25 mg via INTRAVENOUS
  Filled 2022-10-31: qty 5

## 2022-10-31 MED ORDER — SODIUM CHLORIDE 0.9 % IV SOLN
165.0000 mg/m2 | Freq: Once | INTRAVENOUS | Status: DC
  Filled 2022-10-31: qty 14

## 2022-10-31 MED ORDER — LEUCOVORIN CALCIUM INJECTION 350 MG
400.0000 mg/m2 | Freq: Once | INTRAVENOUS | Status: AC
  Administered 2022-10-31: 692 mg via INTRAVENOUS
  Filled 2022-10-31: qty 34.6

## 2022-10-31 MED ORDER — SODIUM CHLORIDE 0.9 % IV SOLN
165.0000 mg/m2 | Freq: Once | INTRAVENOUS | Status: AC
  Administered 2022-10-31: 280 mg via INTRAVENOUS
  Filled 2022-10-31: qty 14

## 2022-10-31 MED ORDER — SODIUM CHLORIDE 0.9 % IV SOLN
2400.0000 mg/m2 | INTRAVENOUS | Status: DC
  Administered 2022-10-31: 4150 mg via INTRAVENOUS
  Filled 2022-10-31: qty 83

## 2022-10-31 MED ORDER — SODIUM CHLORIDE 0.9 % IV SOLN
150.0000 mg | Freq: Once | INTRAVENOUS | Status: AC
  Administered 2022-10-31: 150 mg via INTRAVENOUS
  Filled 2022-10-31: qty 150

## 2022-10-31 MED ORDER — ATROPINE SULFATE 1 MG/ML IV SOLN
0.5000 mg | Freq: Once | INTRAVENOUS | Status: AC
  Administered 2022-10-31: 0.5 mg via INTRAVENOUS
  Filled 2022-10-31: qty 1

## 2022-10-31 MED ORDER — SODIUM CHLORIDE 0.9 % IV SOLN: Freq: Once | INTRAVENOUS | Status: AC

## 2022-10-31 NOTE — Progress Notes (Signed)
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 The report date is 09/08/2022.  The CustomNext-Cancer+RNAinsight panel offered by [facility] includes sequencing and rearrangement analysis for the following 47 genes:  APC, ATM, AXIN2, BARD1, BMPR1A, BRCA1, BRCA2, BRIP1, CDH1, CDK4, CDKN2A, CHEK2, DICER1, EPCAM, GREM1, HOXB13, MEN1, MLH1, MSH2, MSH3, MSH6, MUTYH, NBN, NF1, NF2, NTHL1, PALB2, PMS2, POLD1, POLE, PTEN, RAD51C, RAD51D, RECQL, RET, SDHA, SDHAF2, SDHB, SDHC, SDHD, SMAD4, SMARCA4, STK11, TP53, TSC1, TSC2, and VHL.  RNA data is routinely analyzed for use in variant interpretation for all genes.     CANCER STAGING:  Cancer Staging  Rectal cancer [facility]) Staging form: Colon and Rectum, AJCC 8th Edition - Clinical stage from 03/28/2022: Stage IVA (cTX, cN1, cM1a) - Unsigned   INTERVAL HISTORY:  [name], a 41 y.o. female, seen for follow-up and toxicity assessment prior to next cycle of chemotherapy.  She reports numbness in the fingertips has been stable.  We have discontinued oxaliplatin at last cycle.  Energy levels are 100%.  She wants to go back to work.  She has met with Dr. Marcello Moores yesterday.  REVIEW OF SYSTEMS:  Review of Systems   Neurological:  Positive for numbness (Constant tingling in the fingertips and toes).  Psychiatric/Behavioral:  Negative for sleep disturbance.   All other systems reviewed and are negative.   PAST MEDICAL/SURGICAL HISTORY:  Past Medical History:  Diagnosis Date   Cancer [facility])    Depression, major, single episode, mild ([place])    Port-A-Cath in place 05/24/2022   Past Surgical History:  Procedure Laterality Date   BIOPSY  03/21/2022   Procedure: BIOPSY;  Surgeon: [name], DO;  Location: [facility];  Service: Endoscopy;;   COLONOSCOPY WITH PROPOFOL N/A 03/21/2022   Procedure: COLONOSCOPY WITH PROPOFOL;  Surgeon: [name], DO;  Location: [facility];  Service: Endoscopy;  Laterality: N/A;  8:30am   FLEXIBLE SIGMOIDOSCOPY N/A 05/19/2022   Procedure: FLEXIBLE SIGMOIDOSCOPY WITH TATTOO INJECTION;  Surgeon: Leighton Ruff, MD;  Location: [facility] ORS;  Service: General;  Laterality: N/A;   LAPAROSCOPIC DIVERTED COLOSTOMY N/A 05/19/2022   Procedure: LAPAROSCOPIC DIVERTED OSTOMY;  Surgeon: Leighton Ruff, MD;  Location: [facility] ORS;  Service: General;  Laterality: N/A;   PORTACATH PLACEMENT Right 05/19/2022   Procedure: INSERTION PORT-A-CATH;  Surgeon: Leighton Ruff, MD;  Location: [facility] ORS;  Service: General;  Laterality: Right;    SOCIAL HISTORY:  Social History   Socioeconomic History   Marital status: Married    Spouse name: Not on file   Number of children: Not on file   Years of education: Not on file   Highest education level: Not on file  Occupational History   Not on file  Tobacco  Use   Smoking status: Every Day    Packs/day: 2.00    Years: 27.00    Total pack years: 54.00    Types: Cigarettes   Smokeless tobacco: Never  Vaping Use   Vaping Use: Never used  Substance and Sexual Activity   Alcohol use: No   Drug use: No   Sexual activity: Yes    Birth control/protection: Injection  Other Topics Concern   Not on file  Social History Narrative   Not on file    Social Determinants of Health   Financial Resource Strain: Not on file  Food Insecurity: Not on file  Transportation Needs: Not on file  Physical Activity: Not on file  Stress: Not on file  Social Connections: Not on file  Intimate Partner Violence: Not on file    FAMILY HISTORY:  Family History  Problem Relation Age of Onset   Heart disease Mother    Diabetes Mother    Hearing loss Father    Heart disease Father    Hyperlipidemia Father    Hypertension Father    Stroke Father    Arthritis Father    Diabetes Father    Learning disabilities Brother    Leukemia Maternal Uncle        d. 59s   Colon cancer Paternal Aunt        dx 85s   Diabetes Maternal Grandmother    Heart disease Maternal Grandmother    Diabetes Maternal Grandfather    Diabetes Paternal Grandmother    Diabetes Paternal Grandfather    Breast cancer Cousin 37    CURRENT MEDICATIONS:  Current Outpatient Medications  Medication Sig Dispense Refill   Bevacizumab (AVASTIN IV) Inject into the vein every 14 (fourteen) days.     Calcium-Cholecalciferol-Zinc 650-20-5.5 MG-MCG-MG CHEW Chew by mouth.     cetirizine (ZYRTEC) 10 MG tablet Take 10 mg by mouth daily.     fluorouracil CALGB 28413 2,400 mg/m2 in sodium chloride 0.9 % 150 mL Inject 2,400 mg/m2 into the vein over 48 hr.     FLUOROURACIL IV Inject into the vein every 14 (fourteen) days.     gabapentin (NEURONTIN) 300 MG capsule Take 1 capsule (300 mg total) by mouth 3 (three) times daily. 30 capsule 3   HYDROcodone-acetaminophen (NORCO) 5-325 MG tablet Take 1 tablet by mouth every 8 (eight) hours as needed for moderate pain. 90 tablet 0   ibuprofen (ADVIL) 200 MG tablet Take 1,000 mg by mouth every 6 (six) hours as needed for moderate pain.     IRINOTECAN HCL IV Inject into the vein every 14 (fourteen) days.     LEUCOVORIN CALCIUM IV Inject into the vein every 14 (fourteen) days.     medroxyPROGESTERone (DEPO-PROVERA) 150 MG/ML injection INJECT 1 ML  (150 MG TOTAL) INTO THE MUSCLE EVERY 3 (THREE) MONTHS 1 mL 3   Multiple Vitamin (MULTIVITAMIN) tablet Take 1 tablet by mouth daily.     OXALIPLATIN IV Inject into the vein every 14 (fourteen) days.     Current Facility-Administered Medications  Medication Dose Route Frequency Provider Last Rate Last Admin   medroxyPROGESTERone (DEPO-PROVERA) injection 150 mg  150 mg Intramuscular Q90 days Evelina Dun A, FNP   150 mg at 09/21/22 1619   Facility-Administered Medications Ordered in Other Visits  Medication Dose Route Frequency Provider Last Rate Last Admin   fluorouracil (ADRUCIL) 4,150 mg in sodium chloride 0.9 % 67 mL chemo infusion  2,400 mg/m2 (Treatment Plan Recorded) Intravenous 1 day or 1  dose Derek Jack, MD   Infusion Verify at 10/31/22 1509    ALLERGIES:  Allergies  Allergen Reactions   Wellbutrin [Bupropion]     insomnia   Tramadol Rash    PHYSICAL EXAM:  Performance status (ECOG): 0 - Asymptomatic  There were no vitals filed for this visit. Wt Readings from Last 3 Encounters:  10/31/22 160 lb 3.2 oz (72.7 kg)  10/16/22 156 lb 8 oz (71 kg)  10/02/22 152 lb 14.4 oz (69.4 kg)   Physical Exam Vitals reviewed.  Constitutional:      Appearance: Normal appearance.  Cardiovascular:     Rate and Rhythm: Normal rate and regular rhythm.     Pulses: Normal pulses.     Heart sounds: Normal heart sounds.  Pulmonary:     Effort: Pulmonary effort is normal.     Breath sounds: Normal breath sounds.  Neurological:     General: No focal deficit present.     Mental Status: She is alert and oriented to person, place, and time.  Psychiatric:        Mood and Affect: Mood normal.        Behavior: Behavior normal.     LABORATORY DATA:  I have reviewed the labs as listed.     Latest Ref Rng & Units 10/31/2022    7:59 AM 10/16/2022    9:05 AM 10/02/2022    9:10 AM  CBC  WBC 4.0 - 10.5 K/uL 8.1  8.1  7.2   Hemoglobin 12.0 - 15.0 g/dL 14.4  13.8  13.5   Hematocrit  36.0 - 46.0 % 42.7  40.9  39.3   Platelets 150 - 400 K/uL 236  215  192       Latest Ref Rng & Units 10/31/2022    7:59 AM 10/16/2022    9:05 AM 10/02/2022    9:10 AM  CMP  Glucose 70 - 99 mg/dL 113  94  111   BUN 6 - 20 mg/dL _0 Creatinine 0.44 - 1.00 mg/dL 0.69  0.67  0.61   Sodium 135 - 145 mmol/L 137  138  140   Potassium 3.5 - 5.1 mmol/L 4.0  4.1  4.0   Chloride 98 - 111 mmol/L 107  108  108   CO2 22 - 32 mmol/L _1 Calcium 8.9 - 10.3 mg/dL 9.0  9.2  9.4   Total Protein 6.5 - 8.1 g/dL 6.7  6.9  6.6   Total Bilirubin 0.3 - 1.2 mg/dL 0.4  0.4  0.3   Alkaline Phos 38 - 126 U/L 53  60  53   AST 15 - 41 U/L _2 ALT 0 - 44 U/L _3 DIAGNOSTIC IMAGING:  I have independently reviewed the scans and discussed with the patient. MR PELVIS WO CM RECTAL CA STAGING  Result Date: 10/23/2022 CLINICAL DATA:  History of rectal cancer, assess treatment. EXAM: MRI PELVIS WITHOUT CONTRAST TECHNIQUE: Multiplanar multisequence MR imaging of the pelvis was performed. No intravenous contrast was administered. Ultrasound gel was administered per rectum to optimize tumor evaluation. COMPARISON:  Prior CT and PET imaging. No prior imaging of the pelvis with MRI for comparison. FINDINGS: TUMOR LOCATION Tumor distance from Anal Verge/Skin Surface:  8 cm Tumor distance to Internal Anal Sphincter: 5.5 cm TUMOR DESCRIPTION Circumferential Extent: Circumferential tumor with mixed signal showing intermediate and low T2  signal with focal area of narrowing. Narrowing best seen on sagittal images and oblique axials (image 20/2) (image 13/11) scattered mesorectal lymph nodes outlined below and signs of invasion of posterior cervical stroma (image 20/2) focal area of intermediate T2 signal 11 x 13 mm with restricted diffusion. Bandlike changes extending posteriorly from the tumor on image 20/2 as well towards the presacral space. Signs of edema in the presacral space with similar  distribution when compared to previous imaging from August 14, 2022 (image 17/5) Tumor Length: Approximately 4.6 cm.  Cm T - CATEGORY Extension through Muscularis Propria: Yes with gross invasion of the cervix. Shortest Distance of any tumor/node from Mesorectal Fascia: 0 mm Extramural Vascular Invasion/Tumor Thrombus: Suspect EM VI at the time of initial diagnosis. Site of suspected involvement on image 11/11 where there is persistent extension of signal beyond the rectal wall that follows vasculature on images 9 and 10. Invasion of Anterior Peritoneal Reflection: Yes (image 24/3) also extension of tumor into the posterior cervix at this level as outlined above. Involvement of Adjacent Organs or Pelvic Sidewall: Gross cervical involvement. Small-bowel in close proximity without definitive signs of involvement but with peaked appearance of small-bowel loops directed towards the anterior peritoneal reflection adjacent to involvement of the anterior peritoneal reflection and cervix (image 8/11) Levator Ani Involvement: No N - CATEGORY Mesorectal Lymph Nodes >=103m: Scattered small mesorectal lymph nodes are less numerous and diminished in size compared to previous CT imaging. Persistent lymph nodes in the mesorectum 5 mm tumor in the LEFT posterolateral mesorectum as an example on image 9/10. Smaller superior rectal lymph nodes were previously enlarged along the LEFT posterolateral aspect of the rectum tracking into the superior rectal chain Extra-mesorectal Lymphadenopathy: No Other: Urinary bladder is collapsed. No sign of small bowel obstruction. Diverting ostomy partially visualized. Vascular structures are nondilated. No bony abnormalities are seen aside from presacral edema. IMPRESSION: 1. Known metastatic rectal adenocarcinoma with persistent rectal narrowing and signs of involvement of the posterior cervical stroma and anterior peritoneal reflection. Signal characteristics within the posterior cervix suggest  residual viable tumor in this location. 2. Persistent presacral edema is not substantially changed compared to previous imaging. A bandlike area of signal extending from the rectum posteriorly likely reflects previous tumor now displaying low T2 signal suggesting post treatment changes. Attention on subsequent imaging is suggested to this constellation of findings to ensure no fistulous communication or sinus tract. Develops over time 3. Distance from tumor to the internal anal sphincter is 5.5 cm. Electronically Signed   By: GZetta BillsM.D.   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  Stools have become bloody/mucousy lately. - She also reported pain in the tailbone region since March 2023.  She also has right-sided lower back pain. - 50 pound weight loss in the last 9 months, part of weight loss was intentional.  She cut back on eating sweets and lost taste to sweets after COVID infection. - CT [facility] with contrast on 03/08/2022: Irregular circumferential masslike wall thickening of sigmoid colon/rectum with adjacent perirectal adenopathy.  Multiple small hypodense lesions in the liver, largest 2.9 cm in the central aspect of the liver, question metastatic disease. - Colonoscopy on 03/21/2022 by Dr. CAbbey Chatters Fungating infiltrative nearly completely obstructing mass in the rectosigmoid colon, mass was circumferential measuring 4 cm in length. - Pathology: Rectal mass biopsy consistent with invasive  moderately differentiated adenocarcinoma.  As there is very scant invasive tumor, MSI studies were deferred. - PET scan (03/23/2022): Hypermetabolic rectal primary long-segment with SUV 14.3.  Left posterior perirectal lymph node 7 mm with SUV 2.9.  Multiple tiny foci of hepatic hypermetabolism. - MRI of the liver (04/01/2022): Multiple small hypovascular rim-enhancing liver lesions, predominantly  in the right hepatic lobe measuring up to 1.1 cm.  3.3 cm hypervascular mass in the central liver, most consistent with FNH/hepatic adenoma. - Liver biopsy (04/20/2022): Metastatic moderately differentiated colonic adenocarcinoma with mucinous features - NGS testing shows K-ras G12 R mutation, PIK3CA exon 21 mutation.  MS-stable.  TMB-low. - Cycle 1 of FOLFOXIRI on 05/29/2022, bevacizumab added during cycle 2    Social/family history: - She is separated and is seen today with her daughter.  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  She is current active smoker, 1 pack/day for 27 years.  Denies drinking alcohol. - Paternal aunt had colon cancer.  Maternal cousin has breast cancer.  Maternal uncle had leukemia and maternal cousin had leukemia.   PLAN:  Metastatic rectal cancer to the liver: -MRI pelvis (10/23/2022): Known metastatic rectal cancer with persistent rectal narrowing and signs of involvement of posterior cervical stroma and anterior peritoneal reflection.  There is evidence of residual tumor in the posterior cervix.  Persistent presacral edema not substantially changed. - She was evaluated by Dr. Marcello Moores.  Dr. Marcello Moores felt that if liver disease is fairly well-controlled, she may proceed with chemoradiation followed by surgical intervention. - I reviewed labs today which showed normal LFTs and CBC.  Last CEA was 3.7. - Proceed with next cycle of chemotherapy today. - Recommend MRI of the liver for better visualization of hepatic metastatic disease.  If the lesions are well-controlled, we will consider chemoradiation therapy. - RTC 2 weeks for follow-up. - She wants to go back to work as she cannot afford [employer].  I have given her a note to go back to work starting 11/03/2022.  2.  Sacral/right lower back pain: - Pain has completely resolved and she is not requiring pain medication.  3.  Peripheral neuropathy: - Toes feel numb on wiggling.  Fingertips feel numb on pressure and  grasping.  Otherwise no peripheral neuropathy.  She has been off of oxaliplatin.   Orders placed this encounter:  Orders Placed This Encounter  Procedures   MR [place], [place] [phone]

## 2022-10-31 NOTE — Progress Notes (Signed)
Chaplain engaged in a follow-up visit with Sharyn Lull.  Megha shared about the different things happening in her life, her upbringing, and about her family.  Adaysha has a number of big decisions to make, and some things that have been on her mind.  Her family and children have been a great support to her through the many transitions happening, and she has been leaning on her faith.  Providencia is thankful to be going back to work soon.    Whitni has a great deal of resilience and learned as a young girl to figure things out and make things happen for herself.  She is leaning into that strength, as well as trusting God.  Chaplain offered a prayer over her as she is learning more about her body and finding out the next steps.    10/31/22 1100  Clinical Encounter Type  Visited With Patient  Visit Type Follow-up;Spiritual support

## 2022-10-31 NOTE — Progress Notes (Signed)
Pt presents today for Zirabev and Folfiri per provider's order. Vital signs and labs WNL for treatment. No Zirabev today per Dr.K. Faythe Ghee to proceed with treatment today per Dr.K.  Folfiri given today per MD orders. Tolerated infusion without adverse affects. Vital signs stable. No complaints at this time. Discharged from clinic ambulatory in stable condition. Alert and oriented x 3. F/U with [facility] as scheduled. 5FU ambulatory pump  infusing

## 2022-10-31 NOTE — Progress Notes (Signed)
Patient has been assessed, vital signs and labs have been reviewed by Dr. Delton Coombes. ANC, Creatinine, LFTs, and Platelets are within treatment parameters per Dr. Delton Coombes. The patient is good to proceed with treatment at this time. Continue to hold Avastin per Dr. Delton Coombes. Primary RN and pharmacy aware.

## 2022-10-31 NOTE — Patient Instructions (Signed)
[facility]  Discharge Instructions: Thank you for choosing [facility] to provide your oncology and hematology care.  If you have a lab appointment with the [facility], please come in thru the Main Entrance and check in at the main information desk.  Wear comfortable clothing and clothing appropriate for easy access to any Portacath or PICC line.   We strive to give you quality time with your provider. You may need to reschedule your appointment if you arrive late (15 or more minutes).  Arriving late affects you and other patients whose appointments are after yours.  Also, if you miss three or more appointments without notifying the office, you may be dismissed from the clinic at the provider's discretion.      For prescription refill requests, have your pharmacy contact our office and allow 72 hours for refills to be completed.    Today you received the following chemotherapy and/or immunotherapy agents Folfiri   To help prevent nausea and vomiting after your treatment, we encourage you to take your nausea medication as directed.   Irinotecan Injection What is this medication? IRINOTECAN (ir in oh TEE kan) treats some types of cancer. It works by slowing down the growth of cancer cells. This medicine may be used for other purposes; ask your health care provider or pharmacist if you have questions. COMMON BRAND NAME(S): Camptosar What should I tell my care team before I take this medication? They need to know if you have any of these conditions: Dehydration Diarrhea Infection, especially a viral infection, such as chickenpox, cold sores, herpes Liver disease Low blood cell levels (white cells, red cells, and platelets) Low levels of electrolytes, such as calcium, magnesium, or potassium in your blood Recent or ongoing radiation An unusual or allergic reaction to irinotecan, other medications, foods, dyes, or preservatives If you or your partner  are pregnant or trying to get pregnant Breast-feeding How should I use this medication? This medication is injected into a vein. It is given by your care team in a hospital or clinic setting. Talk to your care team about the use of this medication in children. Special care may be needed. Overdosage: If you think you have taken too much of this medicine contact a poison control center or emergency room at once. NOTE: This medicine is only for you. Do not share this medicine with others. What if I miss a dose? Keep appointments for follow-up doses. It is important not to miss your dose. Call your care team if you are unable to keep an appointment. What may interact with this medication? Do not take this medication with any of the following: Cobicistat Itraconazole This medication may also interact with the following: Certain antibiotics, such as clarithromycin, rifampin, rifabutin Certain antivirals for HIV or AIDS Certain medications for fungal infections, such as ketoconazole, posaconazole, voriconazole Certain medications for seizures, such as carbamazepine, phenobarbital, phenytoin Gemfibrozil Nefazodone St. John's wort This list may not describe all possible interactions. Give your health care provider a list of all the medicines, herbs, non-prescription drugs, or dietary supplements you use. Also tell them if you smoke, drink alcohol, or use illegal drugs. Some items may interact with your medicine. What should I watch for while using this medication? Your condition will be monitored carefully while you are receiving this medication. You may need blood work while taking this medication. This medication may make you feel generally unwell. This is not uncommon as chemotherapy can affect healthy cells as  well as cancer cells. Report any side effects. Continue your course of treatment even though you feel ill unless your care team tells you to stop. This medication can cause serious side  effects. To reduce the risk, your care team may give you other medications to take before receiving this one. Be sure to follow the directions from your care team. This medication may affect your coordination, reaction time, or judgement. Do not drive or operate machinery until you know how this medication affects you. Sit up or stand slowly to reduce the risk of dizzy or fainting spells. Drinking alcohol with this medication can increase the risk of these side effects. This medication may increase your risk of getting an infection. Call your care team for advice if you get a fever, chills, sore throat, or other symptoms of a cold or flu. Do not treat yourself. Try to avoid being around people who are sick. Avoid taking medications that contain aspirin, acetaminophen, ibuprofen, naproxen, or ketoprofen unless instructed by your care team. These medications may hide a fever. This medication may increase your risk to bruise or bleed. Call your care team if you notice any unusual bleeding. Be careful brushing or flossing your teeth or using a toothpick because you may get an infection or bleed more easily. If you have any dental work done, tell your dentist you are receiving this medication. Talk to your care team if you or your partner are pregnant or think either of you might be pregnant. This medication can cause serious birth defects if taken during pregnancy and for 6 months after the last dose. You will need a negative pregnancy test before starting this medication. Contraception is recommended while taking this medication and for 6 months after the last dose. Your care team can help you find the option that works for you. Do not father a child while taking this medication and for 3 months after the last dose. Use a condom for contraception during this time period. Do not breastfeed while taking this medication and for 7 days after the last dose. This medication may cause infertility. Talk to your care  team if you are concerned about your fertility. What side effects may I notice from receiving this medication? Side effects that you should report to your care team as soon as possible: Allergic reactions--skin rash, itching, hives, swelling of the face, lips, tongue, or throat Dry cough, shortness of breath or trouble breathing Increased saliva or tears, increased sweating, stomach cramping, diarrhea, small pupils, unusual weakness or fatigue, slow heartbeat Infection--fever, chills, cough, sore throat, wounds that don't heal, pain or trouble when passing urine, general feeling of discomfort or being unwell Kidney injury--decrease in the amount of urine, swelling of the ankles, hands, or feet Low red blood cell level--unusual weakness or fatigue, dizziness, headache, trouble breathing Severe or prolonged diarrhea Unusual bruising or bleeding Side effects that usually do not require medical attention (report to your care team if they continue or are bothersome): Constipation Diarrhea Hair loss Loss of appetite Nausea Stomach pain This list may not describe all possible side effects. Call your doctor for medical advice about side effects. You may report side effects to FDA at 1-800-FDA-1088. Where should I keep my medication? This medication is given in a hospital or clinic. It will not be stored at home. NOTE: This sheet is a summary. It may not cover all possible information. If you have questions about this medicine, talk to your doctor, pharmacist, or health care provider.  2023 Elsevier/Gold Standard (2022-03-30 00:00:00)  Leucovorin Injection What is this medication? LEUCOVORIN (loo koe VOR in) prevents side effects from certain medications, such as methotrexate. It works by increasing folate levels. This helps protect healthy cells in your body. It may also be used to treat anemia caused by low levels of folate. It can also be used with fluorouracil, a type of chemotherapy, to treat  colorectal cancer. It works by increasing the effects of fluorouracil in the body. This medicine may be used for other purposes; ask your health care provider or pharmacist if you have questions. What should I tell my care team before I take this medication? They need to know if you have any of these conditions: Anemia from low levels of vitamin B12 in the blood An unusual or allergic reaction to leucovorin, folic acid, other medications, foods, dyes, or preservatives Pregnant or trying to get pregnant Breastfeeding How should I use this medication? This medication is injected into a vein or a muscle. It is given by your care team in a hospital or clinic setting. Talk to your care team about the use of this medication in children. Special care may be needed. Overdosage: If you think you have taken too much of this medicine contact a poison control center or emergency room at once. NOTE: This medicine is only for you. Do not share this medicine with others. What if I miss a dose? Keep appointments for follow-up doses. It is important not to miss your dose. Call your care team if you are unable to keep an appointment. What may interact with this medication? Capecitabine Fluorouracil Phenobarbital Phenytoin Primidone Trimethoprim;sulfamethoxazole This list may not describe all possible interactions. Give your health care provider a list of all the medicines, herbs, non-prescription drugs, or dietary supplements you use. Also tell them if you smoke, drink alcohol, or use illegal drugs. Some items may interact with your medicine. What should I watch for while using this medication? Your condition will be monitored carefully while you are receiving this medication. This medication may increase the side effects of 5-fluorouracil. Tell your care team if you have diarrhea or mouth sores that do not get better or that get worse. What side effects may I notice from receiving this medication? Side  effects that you should report to your care team as soon as possible: Allergic reactions--skin rash, itching, hives, swelling of the face, lips, tongue, or throat This list may not describe all possible side effects. Call your doctor for medical advice about side effects. You may report side effects to FDA at 1-800-FDA-1088. Where should I keep my medication? This medication is given in a hospital or clinic. It will not be stored at home. NOTE: This sheet is a summary. It may not cover all possible information. If you have questions about this medicine, talk to your doctor, pharmacist, or health care provider.  2023 Elsevier/Gold Standard (2022-04-25 00:00:00)  Fluorouracil Injection What is this medication? FLUOROURACIL (flure oh YOOR a sil) treats some types of cancer. It works by slowing down the growth of cancer cells. This medicine may be used for other purposes; ask your health care provider or pharmacist if you have questions. COMMON BRAND NAME(S): Adrucil What should I tell my care team before I take this medication? They need to know if you have any of these conditions: Blood disorders Dihydropyrimidine dehydrogenase (DPD) deficiency Infection, such as chickenpox, cold sores, herpes Kidney disease Liver disease Poor nutrition Recent or ongoing radiation therapy An unusual or  allergic reaction to fluorouracil, other medications, foods, dyes, or preservatives If you or your partner are pregnant or trying to get pregnant Breast-feeding How should I use this medication? This medication is injected into a vein. It is administered by your care team in a hospital or clinic setting. Talk to your care team about the use of this medication in children. Special care may be needed. Overdosage: If you think you have taken too much of this medicine contact a poison control center or emergency room at once. NOTE: This medicine is only for you. Do not share this medicine with others. What if  I miss a dose? Keep appointments for follow-up doses. It is important not to miss your dose. Call your care team if you are unable to keep an appointment. What may interact with this medication? Do not take this medication with any of the following: Live virus vaccines This medication may also interact with the following: Medications that treat or prevent blood clots, such as warfarin, enoxaparin, dalteparin This list may not describe all possible interactions. Give your health care provider a list of all the medicines, herbs, non-prescription drugs, or dietary supplements you use. Also tell them if you smoke, drink alcohol, or use illegal drugs. Some items may interact with your medicine. What should I watch for while using this medication? Your condition will be monitored carefully while you are receiving this medication. This medication may make you feel generally unwell. This is not uncommon as chemotherapy can affect healthy cells as well as cancer cells. Report any side effects. Continue your course of treatment even though you feel ill unless your care team tells you to stop. In some cases, you may be given additional medications to help with side effects. Follow all directions for their use. This medication may increase your risk of getting an infection. Call your care team for advice if you get a fever, chills, sore throat, or other symptoms of a cold or flu. Do not treat yourself. Try to avoid being around people who are sick. This medication may increase your risk to bruise or bleed. Call your care team if you notice any unusual bleeding. Be careful brushing or flossing your teeth or using a toothpick because you may get an infection or bleed more easily. If you have any dental work done, tell your dentist you are receiving this medication. Avoid taking medications that contain aspirin, acetaminophen, ibuprofen, naproxen, or ketoprofen unless instructed by your care team. These medications  may hide a fever. Do not treat diarrhea with over the counter products. Contact your care team if you have diarrhea that lasts more than 2 days or if it is severe and watery. This medication can make you more sensitive to the sun. Keep out of the sun. If you cannot avoid being in the sun, wear protective clothing and sunscreen. Do not use sun lamps, tanning beds, or tanning booths. Talk to your care team if you or your partner wish to become pregnant or think you might be pregnant. This medication can cause serious birth defects if taken during pregnancy and for 3 months after the last dose. A reliable form of contraception is recommended while taking this medication and for 3 months after the last dose. Talk to your care team about effective forms of contraception. Do not father a child while taking this medication and for 3 months after the last dose. Use a condom while having sex during this time period. Do not breastfeed while taking this  medication. This medication may cause infertility. Talk to your care team if you are concerned about your fertility. What side effects may I notice from receiving this medication? Side effects that you should report to your care team as soon as possible: Allergic reactions--skin rash, itching, hives, swelling of the face, lips, tongue, or throat Heart attack--pain or tightness in the chest, shoulders, arms, or jaw, nausea, shortness of breath, cold or clammy skin, feeling faint or lightheaded Heart failure--shortness of breath, swelling of the ankles, feet, or hands, sudden weight gain, unusual weakness or fatigue Heart rhythm changes--fast or irregular heartbeat, dizziness, feeling faint or lightheaded, chest pain, trouble breathing High ammonia level--unusual weakness or fatigue, confusion, loss of appetite, nausea, vomiting, seizures Infection--fever, chills, cough, sore throat, wounds that don't heal, pain or trouble when passing urine, general feeling of  discomfort or being unwell Low red blood cell level--unusual weakness or fatigue, dizziness, headache, trouble breathing Pain, tingling, or numbness in the hands or feet, muscle weakness, change in vision, confusion or trouble speaking, loss of balance or coordination, trouble walking, seizures Redness, swelling, and blistering of the skin over hands and feet Severe or prolonged diarrhea Unusual bruising or bleeding Side effects that usually do not require medical attention (report to your care team if they continue or are bothersome): Dry skin Headache Increased tears Nausea Pain, redness, or swelling with sores inside the mouth or throat Sensitivity to light Vomiting This list may not describe all possible side effects. Call your doctor for medical advice about side effects. You may report side effects to FDA at 1-800-FDA-1088. Where should I keep my medication? This medication is given in a hospital or clinic. It will not be stored at home. NOTE: This sheet is a summary. It may not cover all possible information. If you have questions about this medicine, talk to your doctor, pharmacist, or health care provider.  2023 Elsevier/Gold Standard (2022-03-21 00:00:00)    BELOW ARE SYMPTOMS THAT SHOULD BE REPORTED IMMEDIATELY: *FEVER GREATER THAN 100.4 F (38 C) OR HIGHER *CHILLS OR SWEATING *NAUSEA AND VOMITING THAT IS NOT CONTROLLED WITH YOUR NAUSEA MEDICATION *UNUSUAL SHORTNESS OF BREATH *UNUSUAL BRUISING OR BLEEDING *URINARY PROBLEMS (pain or burning when urinating, or frequent urination) *BOWEL PROBLEMS (unusual diarrhea, constipation, pain near the anus) TENDERNESS IN MOUTH AND THROAT WITH OR WITHOUT PRESENCE OF ULCERS (sore throat, sores in mouth, or a toothache) UNUSUAL RASH, SWELLING OR PAIN  UNUSUAL VAGINAL DISCHARGE OR ITCHING   Items with * indicate a potential emergency and should be followed up as soon as possible or go to the Emergency Department if any problems should  occur.  Please show the CHEMOTHERAPY ALERT CARD or IMMUNOTHERAPY ALERT CARD at check-in to the Emergency Department and triage nurse.  Should you have questions after your visit or need to cancel or reschedule your appointment, please contact [facility] [phone]  and follow the prompts.  Office hours are 8:00 a.m. to 4:30 p.m. Monday - Friday. Please note that voicemails left after 4:00 p.m. may not be returned until the following business day.  We are closed weekends and major holidays. You have access to a nurse at all times for urgent questions. Please call the main number to the clinic [phone] and follow the prompts.  For any non-urgent questions, you may also contact your provider using MyChart. We now offer e-Visits for anyone 12 and older to request care online for non-urgent symptoms. For details visit [email or web address].   Also download the MyChart  app! Go to the app store, search "MyChart", open the app, select [facility], and log in with your MyChart username and password.  Masks are optional in the cancer centers. If you would like for your care team to wear a mask while they are taking care of you, please let them know. You may have one support person who is at least 43 years old accompany you for your appointments.

## 2022-10-31 NOTE — Patient Instructions (Addendum)
[facility]  Discharge Instructions  You were seen and examined today by Dr. Delton Coombes.  Your MRI of the pelvis is stable. To see the current state of the liver lesions, Dr. Delton Coombes has recommended a MRI of your liver. If the liver lesions are stable or shrunk, we can then discuss radiation and chemotherapy to aggressively treat the rectal cancer in preparation for surgery.  Proceed with treatment today as scheduled.  Follow-up as scheduled.  Thank you for choosing [facility] to provide your oncology and hematology care.   To afford each patient quality time with our provider, please arrive at least 15 minutes before your scheduled appointment time. You may need to reschedule your appointment if you arrive late (10 or more minutes). Arriving late affects you and other patients whose appointments are after yours.  Also, if you miss three or more appointments without notifying the office, you may be dismissed from the clinic at the provider's discretion.    Again, thank you for choosing [facility].  Our hope is that these requests will decrease the amount of time that you wait before being seen by our physicians.   If you have a lab appointment with the [facility] please come in thru the Main Entrance and check in at the main information desk.           _____________________________________________________________  Should you have questions after your visit to [facility], please contact our office at [phone] and follow the prompts.  Our office hours are 8:00 a.m. to 4:30 p.m. Monday - Thursday and 8:00 a.m. to 2:30 p.m. Friday.  Please note that voicemails left after 4:00 p.m. may not be returned until the following business day.  We are closed weekends and all major holidays.  You do have access to a nurse 24-7, just call the main number to the clinic [phone] and do not press any options, hold  on the line and a nurse will answer the phone.    For prescription refill requests, have your pharmacy contact our office and allow 72 hours.    Masks are optional in the cancer centers. If you would like for your care team to wear a mask while they are taking care of you, please let them know. You may have one support person who is at least 43 years old accompany you for your appointments.

## 2022-11-01 ENCOUNTER — Inpatient Hospital Stay: Payer: BC Managed Care – PPO

## 2022-11-02 ENCOUNTER — Inpatient Hospital Stay: Payer: BC Managed Care – PPO

## 2022-11-02 VITALS — BP 137/82 | HR 107 | Resp 18

## 2022-11-02 DIAGNOSIS — Z95828 Presence of other vascular implants and grafts: Secondary | ICD-10-CM

## 2022-11-02 DIAGNOSIS — Z5111 Encounter for antineoplastic chemotherapy: Secondary | ICD-10-CM | POA: Diagnosis not present

## 2022-11-02 DIAGNOSIS — C20 Malignant neoplasm of rectum: Secondary | ICD-10-CM

## 2022-11-02 MED ORDER — SODIUM CHLORIDE 0.9% FLUSH
10.0000 mL | INTRAVENOUS | Status: DC | PRN
  Administered 2022-11-02: 10 mL

## 2022-11-02 MED ORDER — HEPARIN SOD (PORK) LOCK FLUSH 100 UNIT/ML IV SOLN
500.0000 [IU] | Freq: Once | INTRAVENOUS | Status: AC | PRN
  Administered 2022-11-02: 500 [IU]

## 2022-11-02 NOTE — Progress Notes (Signed)
Patient presents today for pump d/c. Vital signs are stable. Port a cath site clean, dry, and intact. Port flushed with 10 mls of Normal Saline and 500 Units of Heparin. Needle removed intact. Band aid applied. Tolerated infusion without adverse affects. Vital signs stable. No complaints at this time. Discharged from clinic ambulatory in stable condition. Alert and oriented x 3. F/U with [facility] as scheduled.

## 2022-11-02 NOTE — Patient Instructions (Signed)
[facility]  Discharge Instructions: Thank you for choosing [facility] to provide your oncology and hematology care.  If you have a lab appointment with the [facility], please come in thru the Main Entrance and check in at the main information desk.  Wear comfortable clothing and clothing appropriate for easy access to any Portacath or PICC line.   We strive to give you quality time with your provider. You may need to reschedule your appointment if you arrive late (15 or more minutes).  Arriving late affects you and other patients whose appointments are after yours.  Also, if you miss three or more appointments without notifying the office, you may be dismissed from the clinic at the provider's discretion.      For prescription refill requests, have your pharmacy contact our office and allow 72 hours for refills to be completed.    Today you received the following chemotherapy and/or immunotherapy agents pump d/c 5FU. Patient presents today for pump d/c. Vital signs are stable. Port a cath site clean, dry, and intact. Port flushed with 10 mls of Normal Saline and 500 Units of Heparin. Needle removed intact. Band aid applied. Patient has no complaints at this time. Discharged from clinic ambulatory and in stable condition. Patient alert and oriented.       To help prevent nausea and vomiting after your treatment, we encourage you to take your nausea medication as directed.  BELOW ARE SYMPTOMS THAT SHOULD BE REPORTED IMMEDIATELY: *FEVER GREATER THAN 100.4 F (38 C) OR HIGHER *CHILLS OR SWEATING *NAUSEA AND VOMITING THAT IS NOT CONTROLLED WITH YOUR NAUSEA MEDICATION *UNUSUAL SHORTNESS OF BREATH *UNUSUAL BRUISING OR BLEEDING *URINARY PROBLEMS (pain or burning when urinating, or frequent urination) *BOWEL PROBLEMS (unusual diarrhea, constipation, pain near the anus) TENDERNESS IN MOUTH AND THROAT WITH OR WITHOUT PRESENCE OF ULCERS (sore throat, sores in  mouth, or a toothache) UNUSUAL RASH, SWELLING OR PAIN  UNUSUAL VAGINAL DISCHARGE OR ITCHING   Items with * indicate a potential emergency and should be followed up as soon as possible or go to the Emergency Department if any problems should occur.  Please show the CHEMOTHERAPY ALERT CARD or IMMUNOTHERAPY ALERT CARD at check-in to the Emergency Department and triage nurse.  Should you have questions after your visit or need to cancel or reschedule your appointment, please contact [facility] [phone]  and follow the prompts.  Office hours are 8:00 a.m. to 4:30 p.m. Monday - Friday. Please note that voicemails left after 4:00 p.m. may not be returned until the following business day.  We are closed weekends and major holidays. You have access to a nurse at all times for urgent questions. Please call the main number to the clinic [phone] and follow the prompts.  For any non-urgent questions, you may also contact your provider using MyChart. We now offer e-Visits for anyone 52 and older to request care online for non-urgent symptoms. For details visit [email or web address].   Also download the MyChart app! Go to the app store, search "MyChart", open the app, select [facility], and log in with your MyChart username and password.  Masks are optional in the cancer centers. If you would like for your care team to wear a mask while they are taking care of you, please let them know. You may have one support person who is at least 43 years old accompany you for your appointments.

## 2022-11-09 ENCOUNTER — Other Ambulatory Visit: Payer: Self-pay

## 2022-11-09 DIAGNOSIS — C20 Malignant neoplasm of rectum: Secondary | ICD-10-CM

## 2022-11-10 ENCOUNTER — Ambulatory Visit (HOSPITAL_BASED_OUTPATIENT_CLINIC_OR_DEPARTMENT_OTHER)
Admission: RE | Admit: 2022-11-10 | Discharge: 2022-11-10 | Disposition: A | Discharge status: Home / Self Care | Payer: BC Managed Care – PPO | Source: Ambulatory Visit | Attending: Hematology | Admitting: Hematology

## 2022-11-10 DIAGNOSIS — M5126 Other intervertebral disc displacement, lumbar region: Secondary | ICD-10-CM | POA: Diagnosis not present

## 2022-11-10 DIAGNOSIS — C787 Secondary malignant neoplasm of liver and intrahepatic bile duct: Secondary | ICD-10-CM | POA: Insufficient documentation

## 2022-11-10 DIAGNOSIS — Z933 Colostomy status: Secondary | ICD-10-CM | POA: Insufficient documentation

## 2022-11-10 DIAGNOSIS — C20 Malignant neoplasm of rectum: Secondary | ICD-10-CM | POA: Insufficient documentation

## 2022-11-10 DIAGNOSIS — K769 Liver disease, unspecified: Secondary | ICD-10-CM | POA: Insufficient documentation

## 2022-11-10 MED ORDER — GADOBUTROL 1 MMOL/ML IV SOLN
7.2000 mL | Freq: Once | INTRAVENOUS | Status: AC | PRN
  Administered 2022-11-10: 7.2 mL via INTRAVENOUS
  Filled 2022-11-10: qty 7.5

## 2022-11-15 ENCOUNTER — Telehealth: Payer: Self-pay

## 2022-11-15 ENCOUNTER — Inpatient Hospital Stay: Payer: BC Managed Care – PPO | Attending: Hematology

## 2022-11-15 ENCOUNTER — Inpatient Hospital Stay: Payer: BC Managed Care – PPO

## 2022-11-15 ENCOUNTER — Encounter: Payer: Self-pay | Admitting: Hematology

## 2022-11-15 ENCOUNTER — Inpatient Hospital Stay (HOSPITAL_BASED_OUTPATIENT_CLINIC_OR_DEPARTMENT_OTHER): Payer: BC Managed Care – PPO | Admitting: Hematology

## 2022-11-15 ENCOUNTER — Other Ambulatory Visit (HOSPITAL_COMMUNITY): Payer: Self-pay

## 2022-11-15 VITALS — BP 115/69 | HR 71 | Temp 98.1°F

## 2022-11-15 VITALS — BP 124/71 | HR 90 | Temp 97.2°F | Resp 18 | Ht 66.34 in | Wt 164.6 lb

## 2022-11-15 DIAGNOSIS — F17210 Nicotine dependence, cigarettes, uncomplicated: Secondary | ICD-10-CM | POA: Insufficient documentation

## 2022-11-15 DIAGNOSIS — C20 Malignant neoplasm of rectum: Secondary | ICD-10-CM

## 2022-11-15 DIAGNOSIS — Z95828 Presence of other vascular implants and grafts: Secondary | ICD-10-CM | POA: Diagnosis not present

## 2022-11-15 DIAGNOSIS — C787 Secondary malignant neoplasm of liver and intrahepatic bile duct: Secondary | ICD-10-CM | POA: Insufficient documentation

## 2022-11-15 DIAGNOSIS — Z5111 Encounter for antineoplastic chemotherapy: Secondary | ICD-10-CM | POA: Insufficient documentation

## 2022-11-15 DIAGNOSIS — G629 Polyneuropathy, unspecified: Secondary | ICD-10-CM | POA: Insufficient documentation

## 2022-11-15 LAB — CBC WITH DIFFERENTIAL/PLATELET
Abs Immature Granulocytes: 0.02 10*3/uL (ref 0.00–0.07)
Basophils Absolute: 0.1 10*3/uL (ref 0.0–0.1)
Basophils Relative: 1 %
Eosinophils Absolute: 0.3 10*3/uL (ref 0.0–0.5)
Eosinophils Relative: 5 %
HCT: 40.6 % (ref 36.0–46.0)
Hemoglobin: 13.6 g/dL (ref 12.0–15.0)
Immature Granulocytes: 0 %
Lymphocytes Relative: 31 %
Lymphs Abs: 2.1 10*3/uL (ref 0.7–4.0)
MCH: 35 pg — ABNORMAL HIGH (ref 26.0–34.0)
MCHC: 33.5 g/dL (ref 30.0–36.0)
MCV: 104.4 fL — ABNORMAL HIGH (ref 80.0–100.0)
Monocytes Absolute: 0.5 10*3/uL (ref 0.1–1.0)
Monocytes Relative: 8 %
Neutro Abs: 3.7 10*3/uL (ref 1.7–7.7)
Neutrophils Relative %: 55 %
Platelets: 235 10*3/uL (ref 150–400)
RBC: 3.89 MIL/uL (ref 3.87–5.11)
RDW: 13.6 % (ref 11.5–15.5)
WBC: 6.7 10*3/uL (ref 4.0–10.5)
nRBC: 0 % (ref 0.0–0.2)

## 2022-11-15 LAB — COMPREHENSIVE METABOLIC PANEL
ALT: 19 U/L (ref 0–44)
AST: 16 U/L (ref 15–41)
Albumin: 3.6 g/dL (ref 3.5–5.0)
Alkaline Phosphatase: 55 U/L (ref 38–126)
Anion gap: 7 (ref 5–15)
BUN: 10 mg/dL (ref 6–20)
CO2: 24 mmol/L (ref 22–32)
Calcium: 9.1 mg/dL (ref 8.9–10.3)
Chloride: 109 mmol/L (ref 98–111)
Creatinine, Ser: 0.72 mg/dL (ref 0.44–1.00)
GFR, Estimated: >60 mL/min (ref >60)
Glucose, Bld: 84 mg/dL (ref 70–99)
Potassium: 4.4 mmol/L (ref 3.5–5.1)
Sodium: 140 mmol/L (ref 135–145)
Total Bilirubin: 0.3 mg/dL (ref 0.3–1.2)
Total Protein: 6.7 g/dL (ref 6.5–8.1)

## 2022-11-15 LAB — MAGNESIUM: Magnesium: 2 mg/dL (ref 1.7–2.4)

## 2022-11-15 MED ORDER — PALONOSETRON HCL INJECTION 0.25 MG/5ML
0.2500 mg | Freq: Once | INTRAVENOUS | Status: AC
  Administered 2022-11-15: 0.25 mg via INTRAVENOUS
  Filled 2022-11-15: qty 5

## 2022-11-15 MED ORDER — SODIUM CHLORIDE 0.9 % IV SOLN
10.0000 mg | Freq: Once | INTRAVENOUS | Status: AC
  Administered 2022-11-15: 10 mg via INTRAVENOUS
  Filled 2022-11-15: qty 10

## 2022-11-15 MED ORDER — SODIUM CHLORIDE 0.9 % IV SOLN
400.0000 mg | Freq: Once | INTRAVENOUS | Status: AC
  Administered 2022-11-15: 400 mg via INTRAVENOUS
  Filled 2022-11-15: qty 16

## 2022-11-15 MED ORDER — SODIUM CHLORIDE 0.9% FLUSH
10.0000 mL | INTRAVENOUS | Status: DC | PRN
  Administered 2022-11-15: 10 mL

## 2022-11-15 MED ORDER — SODIUM CHLORIDE 0.9 % IV SOLN
400.0000 mg/m2 | Freq: Once | INTRAVENOUS | Status: AC
  Administered 2022-11-15: 692 mg via INTRAVENOUS
  Filled 2022-11-15: qty 34.6

## 2022-11-15 MED ORDER — ATROPINE SULFATE 1 MG/ML IV SOLN
0.5000 mg | Freq: Once | INTRAVENOUS | Status: AC
  Administered 2022-11-15: 0.5 mg via INTRAVENOUS
  Filled 2022-11-15: qty 1

## 2022-11-15 MED ORDER — SODIUM CHLORIDE 0.9 % IV SOLN
150.0000 mg | Freq: Once | INTRAVENOUS | Status: AC
  Administered 2022-11-15: 150 mg via INTRAVENOUS
  Filled 2022-11-15: qty 150

## 2022-11-15 MED ORDER — HEPARIN SOD (PORK) LOCK FLUSH 100 UNIT/ML IV SOLN: 500.0000 [IU] | Freq: Once | INTRAVENOUS | Status: DC | PRN

## 2022-11-15 MED ORDER — SODIUM CHLORIDE 0.9 % IV SOLN: Freq: Once | INTRAVENOUS | Status: AC

## 2022-11-15 MED ORDER — SODIUM CHLORIDE 0.9% FLUSH
10.0000 mL | Freq: Once | INTRAVENOUS | Status: AC
  Administered 2022-11-15: 10 mL via INTRAVENOUS

## 2022-11-15 MED ORDER — SODIUM CHLORIDE 0.9 % IV SOLN
2400.0000 mg/m2 | INTRAVENOUS | Status: DC
  Administered 2022-11-15: 4150 mg via INTRAVENOUS
  Filled 2022-11-15: qty 83

## 2022-11-15 MED ORDER — SODIUM CHLORIDE 0.9 % IV SOLN
165.0000 mg/m2 | Freq: Once | INTRAVENOUS | Status: AC
  Administered 2022-11-15: 280 mg via INTRAVENOUS
  Filled 2022-11-15: qty 4

## 2022-11-15 MED ORDER — CAPECITABINE 500 MG PO TABS
1500.0000 mg | ORAL_TABLET | Freq: Two times a day (BID) | ORAL | 3 refills | Status: DC
  Filled 2022-11-15: qty 180, 30d supply, fill #0

## 2022-11-15 NOTE — Patient Instructions (Signed)
[facility]  Discharge Instructions: Thank you for choosing [facility] to provide your oncology and hematology care.  If you have a lab appointment with the [facility], please come in thru the Main Entrance and check in at the main information desk.  Wear comfortable clothing and clothing appropriate for easy access to any Portacath or PICC line.   We strive to give you quality time with your provider. You may need to reschedule your appointment if you arrive late (15 or more minutes).  Arriving late affects you and other patients whose appointments are after yours.  Also, if you miss three or more appointments without notifying the office, you may be dismissed from the clinic at the provider's discretion.      For prescription refill requests, have your pharmacy contact our office and allow 72 hours for refills to be completed.    Today you received the following chemotherapy and/or immunotherapy agents FOLFIRI with pump start      To help prevent nausea and vomiting after your treatment, we encourage you to take your nausea medication as directed.  BELOW ARE SYMPTOMS THAT SHOULD BE REPORTED IMMEDIATELY: *FEVER GREATER THAN 100.4 F (38 C) OR HIGHER *CHILLS OR SWEATING *NAUSEA AND VOMITING THAT IS NOT CONTROLLED WITH YOUR NAUSEA MEDICATION *UNUSUAL SHORTNESS OF BREATH *UNUSUAL BRUISING OR BLEEDING *URINARY PROBLEMS (pain or burning when urinating, or frequent urination) *BOWEL PROBLEMS (unusual diarrhea, constipation, pain near the anus) TENDERNESS IN MOUTH AND THROAT WITH OR WITHOUT PRESENCE OF ULCERS (sore throat, sores in mouth, or a toothache) UNUSUAL RASH, SWELLING OR PAIN  UNUSUAL VAGINAL DISCHARGE OR ITCHING   Items with * indicate a potential emergency and should be followed up as soon as possible or go to the Emergency Department if any problems should occur.  Please show the CHEMOTHERAPY ALERT CARD or IMMUNOTHERAPY ALERT CARD at check-in  to the Emergency Department and triage nurse.  Should you have questions after your visit or need to cancel or reschedule your appointment, please contact [facility] [phone]  and follow the prompts.  Office hours are 8:00 a.m. to 4:30 p.m. Monday - Friday. Please note that voicemails left after 4:00 p.m. may not be returned until the following business day.  We are closed weekends and major holidays. You have access to a nurse at all times for urgent questions. Please call the main number to the clinic [phone] and follow the prompts.  For any non-urgent questions, you may also contact your provider using MyChart. We now offer e-Visits for anyone 1 and older to request care online for non-urgent symptoms. For details visit [email or web address].   Also download the MyChart app! Go to the app store, search "MyChart", open the app, select [facility], and log in with your MyChart username and password.  Masks are optional in the cancer centers. If you would like for your care team to wear a mask while they are taking care of you, please let them know. You may have one support person who is at least 43 years old accompany you for your appointments.

## 2022-11-15 NOTE — Progress Notes (Signed)
Patient presents today for Folfiri infusion with 5FU pump start per providers order.  Vital signs and labs within parameters for treatment.    Treatment given today per MD orders.  Stable during infusion without adverse affects.  5FU pump start and verified RUN on the screen with the patient.  Vital signs stable.  No complaints at this time.  Discharge from clinic ambulatory in stable condition.  Alert and oriented X 3.  Follow up with [facility] as scheduled.

## 2022-11-15 NOTE — Telephone Encounter (Signed)
Oral Oncology Patient Advocate Encounter   Received notification that prior authorization for Capecitabine is required.   PA submitted on 12.13.23  Key [id number]  Status is pending     Berdine Addison, [facility] Patient [facility]  [phone] (phone) [phone] (fax) 11/15/2022 10:53 AM

## 2022-11-15 NOTE — Progress Notes (Signed)
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 The report date is 09/08/2022.  The CustomNext-Cancer+RNAinsight panel offered by [facility] includes sequencing and rearrangement analysis for the following 47 genes:  APC, ATM, AXIN2, BARD1, BMPR1A, BRCA1, BRCA2, BRIP1, CDH1, CDK4, CDKN2A, CHEK2, DICER1, EPCAM, GREM1, HOXB13, MEN1, MLH1, MSH2, MSH3, MSH6, MUTYH, NBN, NF1, NF2, NTHL1, PALB2, PMS2, POLD1, POLE, PTEN, RAD51C, RAD51D, RECQL, RET, SDHA, SDHAF2, SDHB, SDHC, SDHD, SMAD4, SMARCA4, STK11, TP53, TSC1, TSC2, and VHL.  RNA data is routinely analyzed for use in variant interpretation for all genes.     CANCER STAGING:  Cancer Staging  Rectal cancer [facility]) Staging form: Colon and Rectum, AJCC 8th Edition - Clinical stage from 03/28/2022: Stage IVA (cTX, cN1, cM1a) - Unsigned   INTERVAL HISTORY:  Ms. Nikaela Coyne, a 41 y.o. female, seen for follow-up and toxicity assessment prior to next cycle of chemotherapy.  She reports tingling in the fingertips and toes has been stable.  Denies any GI side effects.  Started working again full-time.  REVIEW OF SYSTEMS:  Review of Systems  Neurological:  Positive for numbness (Constant tingling in the  fingertips and toes).  Psychiatric/Behavioral:  Negative for sleep disturbance.   All other systems reviewed and are negative.   PAST MEDICAL/SURGICAL HISTORY:  Past Medical History:  Diagnosis Date   Cancer [facility])    Depression, major, single episode, mild ([place])    Port-A-Cath in place 05/24/2022   Past Surgical History:  Procedure Laterality Date   BIOPSY  03/21/2022   Procedure: BIOPSY;  Surgeon: [name], DO;  Location[facility];  Service: Endoscopy;;   COLONOSCOPY WITH PROPOFOL N/A 03/21/2022   Procedure: COLONOSCOPY WITH PROPOFOL;  Surgeon: [name], DO;  Location: [facility];  Service: Endoscopy;  Laterality: N/A;  8:30am   FLEXIBLE SIGMOIDOSCOPY N/A 05/19/2022   Procedure: FLEXIBLE SIGMOIDOSCOPY WITH TATTOO INJECTION;  Surgeon: Leighton Ruff, MD;  Location: [facility] ORS;  Service: General;  Laterality: N/A;   LAPAROSCOPIC DIVERTED COLOSTOMY N/A 05/19/2022   Procedure: LAPAROSCOPIC DIVERTED OSTOMY;  Surgeon: Leighton Ruff, MD;  Location: [facility] ORS;  Service: General;  Laterality: N/A;   PORTACATH PLACEMENT Right 05/19/2022   Procedure: INSERTION PORT-A-CATH;  Surgeon: Leighton Ruff, MD;  Location: [facility] ORS;  Service: General;  Laterality: Right;    SOCIAL HISTORY:  Social History   Socioeconomic History   Marital status: Married    Spouse name: Not on file   Number of children: Not on file   Years of education: Not on file   Highest education level: Not on file  Occupational History   Not on file  Tobacco Use   Smoking status: Every Day    Packs/day: 2.00    Years:  27.00    Total pack years: 54.00    Types: Cigarettes   Smokeless tobacco: Never  Vaping Use   Vaping Use: Never used  Substance and Sexual Activity   Alcohol use: No   Drug use: No   Sexual activity: Yes    Birth control/protection: Injection  Other Topics Concern   Not on file  Social History Narrative   Not on file   Social Determinants of Health   Financial Resource Strain:  Not on file  Food Insecurity: Not on file  Transportation Needs: Not on file  Physical Activity: Not on file  Stress: Not on file  Social Connections: Not on file  Intimate Partner Violence: Not on file    FAMILY HISTORY:  Family History  Problem Relation Age of Onset   Heart disease Mother    Diabetes Mother    Hearing loss Father    Heart disease Father    Hyperlipidemia Father    Hypertension Father    Stroke Father    Arthritis Father    Diabetes Father    Learning disabilities Brother    Leukemia Maternal Uncle        d. 35s   Colon cancer Paternal Aunt        dx 69s   Diabetes Maternal Grandmother    Heart disease Maternal Grandmother    Diabetes Maternal Grandfather    Diabetes Paternal Grandmother    Diabetes Paternal Grandfather    Breast cancer Cousin 77    CURRENT MEDICATIONS:  Current Outpatient Medications  Medication Sig Dispense Refill   Bevacizumab (AVASTIN IV) Inject into the vein every 14 (fourteen) days.     Calcium-Cholecalciferol-Zinc 650-20-5.5 MG-MCG-MG CHEW Chew by mouth.     cetirizine (ZYRTEC) 10 MG tablet Take 10 mg by mouth daily.     fluorouracil CALGB 51025 2,400 mg/m2 in sodium chloride 0.9 % 150 mL Inject 2,400 mg/m2 into the vein over 48 hr.     FLUOROURACIL IV Inject into the vein every 14 (fourteen) days.     gabapentin (NEURONTIN) 300 MG capsule Take 1 capsule (300 mg total) by mouth 3 (three) times daily. 30 capsule 3   HYDROcodone-acetaminophen (NORCO) 5-325 MG tablet Take 1 tablet by mouth every 8 (eight) hours as needed for moderate pain. 90 tablet 0   ibuprofen (ADVIL) 200 MG tablet Take 1,000 mg by mouth every 6 (six) hours as needed for moderate pain.     IRINOTECAN HCL IV Inject into the vein every 14 (fourteen) days.     LEUCOVORIN CALCIUM IV Inject into the vein every 14 (fourteen) days.     medroxyPROGESTERone (DEPO-PROVERA) 150 MG/ML injection INJECT 1 ML (150 MG TOTAL) INTO THE MUSCLE EVERY 3 (THREE) MONTHS 1 mL 3    Multiple Vitamin (MULTIVITAMIN) tablet Take 1 tablet by mouth daily.     OXALIPLATIN IV Inject into the vein every 14 (fourteen) days.     Current Facility-Administered Medications  Medication Dose Route Frequency Provider Last Rate Last Admin   medroxyPROGESTERone (DEPO-PROVERA) injection 150 mg  150 mg Intramuscular Q90 days Evelina Dun A, FNP   150 mg at 09/21/22 1619    ALLERGIES:  Allergies  Allergen Reactions   Wellbutrin [Bupropion]     insomnia   Tramadol Rash    PHYSICAL EXAM:  Performance status (ECOG): 0 - Asymptomatic  There were no vitals filed for this visit. Wt Readings from Last 3 Encounters:  11/15/22 164 lb 9.6 oz (74.7 kg)  10/31/22 160  lb 3.2 oz (72.7 kg)  10/16/22 156 lb 8 oz (71 kg)   Physical Exam Vitals reviewed.  Constitutional:      Appearance: Normal appearance.  Cardiovascular:     Rate and Rhythm: Normal rate and regular rhythm.     Pulses: Normal pulses.     Heart sounds: Normal heart sounds.  Pulmonary:     Effort: Pulmonary effort is normal.     Breath sounds: Normal breath sounds.  Neurological:     General: No focal deficit present.     Mental Status: She is alert and oriented to person, place, and time.  Psychiatric:        Mood and Affect: Mood normal.        Behavior: Behavior normal.     LABORATORY DATA:  I have reviewed the labs as listed.     Latest Ref Rng & Units 10/31/2022    7:59 AM 10/16/2022    9:05 AM 10/02/2022    9:10 AM  CBC  WBC 4.0 - 10.5 K/uL 8.1  8.1  7.2   Hemoglobin 12.0 - 15.0 g/dL 14.4  13.8  13.5   Hematocrit 36.0 - 46.0 % 42.7  40.9  39.3   Platelets 150 - 400 K/uL 236  215  192       Latest Ref Rng & Units 10/31/2022    7:59 AM 10/16/2022    9:05 AM 10/02/2022    9:10 AM  CMP  Glucose 70 - 99 mg/dL 113  94  111   BUN 6 - 20 mg/dL _0 Creatinine 0.44 - 1.00 mg/dL 0.69  0.67  0.61   Sodium 135 - 145 mmol/L 137  138  140   Potassium 3.5 - 5.1 mmol/L 4.0  4.1  4.0   Chloride 98 -  111 mmol/L 107  108  108   CO2 22 - 32 mmol/L _1 Calcium 8.9 - 10.3 mg/dL 9.0  9.2  9.4   Total Protein 6.5 - 8.1 g/dL 6.7  6.9  6.6   Total Bilirubin 0.3 - 1.2 mg/dL 0.4  0.4  0.3   Alkaline Phos 38 - 126 U/L 53  60  53   AST 15 - 41 U/L _2 ALT 0 - 44 U/L _3 DIAGNOSTIC IMAGING:  I have independently reviewed the scans and discussed with the patient. MR LIVER W WO CONTRAST  Result Date: 11/12/2022 CLINICAL DATA:  Metastatic rectal cancer restaging EXAM: MRI ABDOMEN WITHOUT AND WITH CONTRAST TECHNIQUE: Multiplanar multisequence MR imaging of the abdomen was performed both before and after the administration of intravenous contrast. CONTRAST:  7.37m GADAVIST GADOBUTROL 1 MMOL/ML IV SOLN COMPARISON:  Multiple exams, including 04/01/2022 and CT scan 08/14/2022 FINDINGS: Lower chest: Unremarkable Hepatobiliary: Central arterial phase enhancing lesion measuring 3.7 by 2.9 cm on image 27 of series 11 is roughly stable from 04/01/2022 and becomes isointense on later phase images. This lesion was not previously hypermetabolic and is most compatible with focal nodular hyperplasia. A very similar lesion in the dome of the right hepatic lobe is present measuring 3.6 by 3.0 cm on image 14 series 12. It is possible that this represents transient hepatic attenuation difference or focal nodular hyperplasia, this lesion was not previously hypermetabolic. This is not a characteristic appearance for metastatic lesion. Probable transient hepatic attenuation difference laterally in the right hepatic lobe  on image 27 of series 11 measuring 2.0 by 2.6 cm, with arterial phase enhancement and subsequent isoenhancement. The small rim enhancing lesions in the liver shown on 04/01/2022 are currently not readily apparent. No biliary dilatation.  Gallbladder unremarkable. Pancreas:  Unremarkable Spleen:  Unremarkable Adrenals/Urinary Tract:  Unremarkable Stomach/Bowel: Left colostomy.  Vascular/Lymphatic:  Unremarkable Other:  No supplemental non-categorized findings. Musculoskeletal: Central disc protrusion at L3-4. IMPRESSION: 1. Three liver lesions are identified, one of which has imaging characteristics most compatible with focal nodular hyperplasia. The other 2 lesions are likely transient hepatic attenuation difference and/or FNH. The lesions were not previously hypermetabolic. 2. The previous small rim enhancing lesions in the liver shown on prior MRI are not readily apparent on today's exam, presumably reflecting response to therapy. 3. Central disc protrusion at L3-4. 4. Left colostomy. Electronically Signed   By: Van Clines M.D.   On: 11/12/2022 16:32   MR PELVIS WO CM RECTAL CA STAGING  Result Date: 10/23/2022 CLINICAL DATA:  History of rectal cancer, assess treatment. EXAM: MRI PELVIS WITHOUT CONTRAST TECHNIQUE: Multiplanar multisequence MR imaging of the pelvis was performed. No intravenous contrast was administered. Ultrasound gel was administered per rectum to optimize tumor evaluation. COMPARISON:  Prior CT and PET imaging. No prior imaging of the pelvis with MRI for comparison. FINDINGS: TUMOR LOCATION Tumor distance from Anal Verge/Skin Surface:  8 cm Tumor distance to Internal Anal Sphincter: 5.5 cm TUMOR DESCRIPTION Circumferential Extent: Circumferential tumor with mixed signal showing intermediate and low T2 signal with focal area of narrowing. Narrowing best seen on sagittal images and oblique axials (image 20/2) (image 13/11) scattered mesorectal lymph nodes outlined below and signs of invasion of posterior cervical stroma (image 20/2) focal area of intermediate T2 signal 11 x 13 mm with restricted diffusion. Bandlike changes extending posteriorly from the tumor on image 20/2 as well towards the presacral space. Signs of edema in the presacral space with similar distribution when compared to previous imaging from August 14, 2022 (image 17/5) Tumor Length:  Approximately 4.6 cm.  Cm T - CATEGORY Extension through Muscularis Propria: Yes with gross invasion of the cervix. Shortest Distance of any tumor/node from Mesorectal Fascia: 0 mm Extramural Vascular Invasion/Tumor Thrombus: Suspect EM VI at the time of initial diagnosis. Site of suspected involvement on image 11/11 where there is persistent extension of signal beyond the rectal wall that follows vasculature on images 9 and 10. Invasion of Anterior Peritoneal Reflection: Yes (image 24/3) also extension of tumor into the posterior cervix at this level as outlined above. Involvement of Adjacent Organs or Pelvic Sidewall: Gross cervical involvement. Small-bowel in close proximity without definitive signs of involvement but with peaked appearance of small-bowel loops directed towards the anterior peritoneal reflection adjacent to involvement of the anterior peritoneal reflection and cervix (image 8/11) Levator Ani Involvement: No N - CATEGORY Mesorectal Lymph Nodes >=7m: Scattered small mesorectal lymph nodes are less numerous and diminished in size compared to previous CT imaging. Persistent lymph nodes in the mesorectum 5 mm tumor in the LEFT posterolateral mesorectum as an example on image 9/10. Smaller superior rectal lymph nodes were previously enlarged along the LEFT posterolateral aspect of the rectum tracking into the superior rectal chain Extra-mesorectal Lymphadenopathy: No Other: Urinary bladder is collapsed. No sign of small bowel obstruction. Diverting ostomy partially visualized. Vascular structures are nondilated. No bony abnormalities are seen aside from presacral edema. IMPRESSION: 1. Known metastatic rectal adenocarcinoma with persistent rectal narrowing and signs of involvement of the posterior cervical stroma  and anterior peritoneal reflection. Signal characteristics within the posterior cervix suggest residual viable tumor in this location. 2. Persistent presacral edema is not substantially  changed compared to previous imaging. A bandlike area of signal extending from the rectum posteriorly likely reflects previous tumor now displaying low T2 signal suggesting post treatment changes. Attention on subsequent imaging is suggested to this constellation of findings to ensure no fistulous communication or sinus tract. Develops over time 3. Distance from tumor to the internal anal sphincter is 5.5 cm. Electronically Signed   By: Zetta Bills M.D.   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  Stools have become bloody/mucousy lately. - She also reported pain in the tailbone region since March 2023.  She also has right-sided lower back pain. - 50 pound weight loss in the last 9 months, part of weight loss was intentional.  She cut back on eating sweets and lost taste to sweets after COVID infection. - CT [facility] with contrast on 03/08/2022: Irregular circumferential masslike wall thickening of sigmoid colon/rectum with adjacent perirectal adenopathy.  Multiple small hypodense lesions in the liver, largest 2.9 cm in the central aspect of the liver, question metastatic disease. - Colonoscopy on 03/21/2022 by Dr. Abbey Chatters: Fungating infiltrative nearly completely obstructing mass in the rectosigmoid colon, mass was circumferential measuring 4 cm in length. - Pathology: Rectal mass biopsy consistent with invasive moderately differentiated adenocarcinoma.  As there is very scant invasive tumor, MSI studies were deferred. - PET scan (03/23/2022): Hypermetabolic rectal primary long-segment with SUV 14.3.  Left posterior perirectal lymph node 7 mm with SUV 2.9.  Multiple tiny foci of hepatic hypermetabolism. - MRI of the liver (04/01/2022): Multiple small hypovascular rim-enhancing liver lesions, predominantly in the right hepatic lobe measuring up to 1.1 cm.  3.3 cm hypervascular mass in the central  liver, most consistent with FNH/hepatic adenoma. - Liver biopsy (04/20/2022): Metastatic moderately differentiated colonic adenocarcinoma with mucinous features - NGS testing shows K-ras G12 R mutation, PIK3CA exon 21 mutation.  MS-stable.  TMB-low. - Cycle 1 of FOLFOXIRI on 05/29/2022, bevacizumab added during cycle 2 -MRI pelvis (10/23/2022): Known metastatic rectal cancer with persistent rectal narrowing and signs of involvement of posterior cervical stroma and anterior peritoneal reflection.  There is evidence of residual tumor in the posterior cervix.  Persistent presacral edema not substantially changed.   Social/family history: - She is separated and is seen today with her daughter.  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  She is current active smoker, 1 pack/day for 27 years.  Denies drinking alcohol. - Paternal aunt had colon cancer.  Maternal cousin has breast cancer.  Maternal uncle had leukemia and maternal cousin had leukemia.   PLAN:  Metastatic rectal cancer to the liver: - We reviewed MRI of the liver (11/10/2022): 3 benign lesions noted.  Previous small rim-enhancing metastatic lesions in the liver are no longer seen. - Last CEA 3.7.  Today labs show normal LFTs and CBC. - Proceed with cycle 13 today of FOLFIRI with bevacizumab. - Dr. Marcello Moores felt that if liver lesions are well-controlled, she will likely benefit from chemoradiation followed by surgery. - We will make referral to radiation oncology. - We talked about Xeloda 3 tablets twice daily Monday through Friday throughout the course of radiation. - RTC 2 weeks for follow-up.  2.  Sacral/right lower back pain: - Pain resolved completely.  3.  Peripheral neuropathy: - Fingertips feel numb on pressure and grasping.  Toe numbness is stable.  No neuropathic pains.   Orders placed this encounter:  No orders of the defined types were placed in this encounter.      Derek Jack, MD [place] [phone]

## 2022-11-15 NOTE — Progress Notes (Signed)
Patient has been examined by Dr. Katragadda, and vital signs and labs have been reviewed. ANC, Creatinine, LFTs, hemoglobin, and platelets are within treatment parameters per M.D. - pt may proceed with treatment.  Primary RN and pharmacy notified.  

## 2022-11-15 NOTE — Telephone Encounter (Signed)
[facility] sent a manual Prior Authorization through fax instead of [facility]. I have filled out PA and sent to MD office for signature. I will submit once I have received back.   Berdine Addison, [facility] Patient [place]  [phone] (phone) [phone] (fax) 11/15/2022 2:42 PM

## 2022-11-15 NOTE — Progress Notes (Signed)
Confirmed no surgery scheduled or done until after radiation completed.  Will continue to monitor bevacizumab for surgical holds.  Ok to adjust bevacizumab dose with current weight 74.7 kg = 400 mg.  V.O Dr Rhys Martini, PharmD

## 2022-11-16 ENCOUNTER — Other Ambulatory Visit (HOSPITAL_COMMUNITY): Payer: Self-pay

## 2022-11-16 ENCOUNTER — Telehealth (HOSPITAL_COMMUNITY): Payer: Self-pay | Admitting: Pharmacist

## 2022-11-16 DIAGNOSIS — C20 Malignant neoplasm of rectum: Secondary | ICD-10-CM

## 2022-11-16 MED ORDER — CAPECITABINE 500 MG PO TABS: 1500.0000 mg | ORAL_TABLET | Freq: Two times a day (BID) | ORAL | 3 refills | Status: DC

## 2022-11-16 NOTE — Telephone Encounter (Signed)
Oral Oncology Patient Advocate Encounter  Prior Authorization for Capecitabine has been approved.    PA# [id number]  Effective dates: 11/16/2022 through 11/16/2023  Patient cannot fill through [facility] per insurance  -SPECIALTY DRUG: [facility] CALL [phone] PRODUCT/SERVICE NOT APPROPRIATE FOR [place], [place] Patient [place]  [phone] (phone) [phone] (fax) 11/16/2022 11:45 AM

## 2022-11-16 NOTE — Telephone Encounter (Signed)
Oral Oncology Pharmacist Encounter  Received new prescription for Xeloda (capecitabine) for the treatment of metastatic rectal cancer in conjunction with radiation, planned for duration of radiation.  CMP and CBC w/ DIff from 11/15/22 assessed, no baseline dose adjustments required for Xeloda. Prescription dose and frequency assessed for appropriateness. Of note, patient had cycle of FOLFIRI + bevacizumab on 11/15/22.  Current medication list in Epic reviewed, no relevant/significant DDIs with Xeloda identified.  Evaluated chart and no patient barriers to medication adherence noted.   Patient's insurance requires Xeloda be filled through [facility] - prescription redirected for dispensing.  Oral Oncology Clinic will continue to follow for insurance authorization, copayment issues, initial counseling and start date.  Leron Croak, PharmD, BCPS, BCOP Hematology/Oncology Clinical Pharmacist [facility] [place] [phone] 11/16/2022 12:01 PM

## 2022-11-16 NOTE — Telephone Encounter (Signed)
Submitted Prior Authorization via fax to [phone].   I will continue to follow and update.  Berdine Addison, [facility] Patient [place]  [phone] (phone) [phone] (fax) 11/16/2022 8:38 AM

## 2022-11-17 ENCOUNTER — Inpatient Hospital Stay: Payer: BC Managed Care – PPO

## 2022-11-17 VITALS — BP 126/71 | HR 102 | Temp 97.3°F | Resp 20

## 2022-11-17 DIAGNOSIS — Z95828 Presence of other vascular implants and grafts: Secondary | ICD-10-CM

## 2022-11-17 DIAGNOSIS — Z5111 Encounter for antineoplastic chemotherapy: Secondary | ICD-10-CM | POA: Diagnosis not present

## 2022-11-17 DIAGNOSIS — C20 Malignant neoplasm of rectum: Secondary | ICD-10-CM

## 2022-11-17 MED ORDER — SODIUM CHLORIDE 0.9% FLUSH
10.0000 mL | INTRAVENOUS | Status: DC | PRN
  Administered 2022-11-17: 10 mL

## 2022-11-17 MED ORDER — HEPARIN SOD (PORK) LOCK FLUSH 100 UNIT/ML IV SOLN
500.0000 [IU] | Freq: Once | INTRAVENOUS | Status: AC | PRN
  Administered 2022-11-17: 500 [IU]

## 2022-11-17 NOTE — Progress Notes (Signed)
Patients port flushed without difficulty.  Good blood return noted with no bruising or swelling noted at site.  Home infusion 5FU pump disconnected with no issues.  Band aid applied.  VSS with discharge and left in satisfactory condition with no s/s of distress noted.   

## 2022-11-17 NOTE — Patient Instructions (Signed)
[facility]  Discharge Instructions: Thank you for choosing [facility] to provide your oncology and hematology care.  If you have a lab appointment with the Cancer Center, please come in thru the Main Entrance and check in at the main information desk.  Wear comfortable clothing and clothing appropriate for easy access to any Portacath or PICC line.   We strive to give you quality time with your provider. You may need to reschedule your appointment if you arrive late (15 or more minutes).  Arriving late affects you and other patients whose appointments are after yours.  Also, if you miss three or more appointments without notifying the office, you may be dismissed from the clinic at the provider's discretion.      For prescription refill requests, have your pharmacy contact our office and allow 72 hours for refills to be completed.     To help prevent nausea and vomiting after your treatment, we encourage you to take your nausea medication as directed.  BELOW ARE SYMPTOMS THAT SHOULD BE REPORTED IMMEDIATELY: *FEVER GREATER THAN 100.4 F (38 C) OR HIGHER *CHILLS OR SWEATING *NAUSEA AND VOMITING THAT IS NOT CONTROLLED WITH YOUR NAUSEA MEDICATION *UNUSUAL SHORTNESS OF BREATH *UNUSUAL BRUISING OR BLEEDING *URINARY PROBLEMS (pain or burning when urinating, or frequent urination) *BOWEL PROBLEMS (unusual diarrhea, constipation, pain near the anus) TENDERNESS IN MOUTH AND THROAT WITH OR WITHOUT PRESENCE OF ULCERS (sore throat, sores in mouth, or a toothache) UNUSUAL RASH, SWELLING OR PAIN  UNUSUAL VAGINAL DISCHARGE OR ITCHING   Items with * indicate a potential emergency and should be followed up as soon as possible or go to the Emergency Department if any problems should occur.  Please show the CHEMOTHERAPY ALERT CARD or IMMUNOTHERAPY ALERT CARD at check-in to the Emergency Department and triage nurse.  Should you have questions after your visit or need to  cancel or reschedule your appointment, please contact [facility] [phone]  and follow the prompts.  Office hours are 8:00 a.m. to 4:30 p.m. Monday - Friday. Please note that voicemails left after 4:00 p.m. may not be returned until the following business day.  We are closed weekends and major holidays. You have access to a nurse at all times for urgent questions. Please call the main number to the clinic [phone] and follow the prompts.  For any non-urgent questions, you may also contact your provider using MyChart. We now offer e-Visits for anyone 18 and older to request care online for non-urgent symptoms. For details visit [email or web address].   Also download the MyChart app! Go to the app store, search "MyChart", open the app, select [facility], and log in with your MyChart username and password.  Masks are optional in the cancer centers. If you would like for your care team to wear a mask while they are taking care of you, please let them know. You may have one support person who is at least 43 years old accompany you for your appointments.  

## 2022-11-20 NOTE — Telephone Encounter (Signed)
Oral Chemotherapy Pharmacist Encounter   Notified that patient's Xeloda is ready to be set up for shipment through [facility] ($0 copay).  Attempted to call patient and inform her of the above, as well to reiterate to patient that she will not be starting Xeloda until she starts radiation therapy. No answer. Phone number left for call back.   Leron Croak, PharmD, BCPS, [facility] Hematology/Oncology Clinical Pharmacist [facility] [place] [phone] 11/20/2022 11:57 AM

## 2022-11-21 ENCOUNTER — Telehealth: Payer: Self-pay | Admitting: Pharmacist

## 2022-11-21 NOTE — Telephone Encounter (Signed)
Oral Chemotherapy Pharmacist Encounter  Patient reports having a consult appt with [facility] radonc this week. She is going to return a call to [facility] about her capecitabine today. She knows not to start the capecitabine until her first radiation treatment day.   Patient Education I spoke with patient for overview of new oral chemotherapy medication: Xeloda (capecitabine) for the treatment of metastatic rectal cancer in conjunction with radiation, planned for duration of radiation.  Pt is doing well. Counseled patient on administration, dosing, side effects, monitoring, drug-food interactions, safe handling, storage, and disposal. Patient will take 3 tablets (1,500 mg total) by mouth 2 (two) times daily. Take within 30 minutes after meals. Take only on the days of radiation therapy.  Side effects include but not limited to: diarrhea, hand-foot syndrome, mouth sores, edema, decreased wbc, fatigue, N/V Diarrhea: Patient reports having loperamide on hand to use as needed. She knows to call the office if she is having 4 or more loose stools per day Hand-foot syndrome: Recommended the use of [place] Smooth Extra Care 20. She knows to report any skin changes Mouth sores: She knows to request magic mouthwash if needed  Reviewed with patient importance of keeping a medication schedule and plan for any missed doses.  After discussion with patient no patient barriers to medication adherence identified.   Ms. Rigdon voiced understanding and appreciation. All questions answered. Medication handout provided.  Provided patient with [facility] phone number. Patient knows to call the office with questions or concerns. Oral Chemotherapy Navigation Clinic will continue to follow.  Darl Pikes, PharmD, BCPS, BCOP, CPP Hematology/Oncology Clinical Pharmacist Practitioner [place]/[facility] [phone]  11/21/2022 3:40 PM

## 2022-11-23 ENCOUNTER — Other Ambulatory Visit (HOSPITAL_COMMUNITY): Payer: Self-pay

## 2022-11-29 ENCOUNTER — Inpatient Hospital Stay: Payer: BC Managed Care – PPO

## 2022-11-29 ENCOUNTER — Inpatient Hospital Stay (HOSPITAL_BASED_OUTPATIENT_CLINIC_OR_DEPARTMENT_OTHER): Payer: BC Managed Care – PPO | Admitting: Hematology

## 2022-11-29 DIAGNOSIS — C20 Malignant neoplasm of rectum: Secondary | ICD-10-CM

## 2022-11-29 DIAGNOSIS — Z95828 Presence of other vascular implants and grafts: Secondary | ICD-10-CM

## 2022-11-29 DIAGNOSIS — Z5111 Encounter for antineoplastic chemotherapy: Secondary | ICD-10-CM | POA: Diagnosis not present

## 2022-11-29 LAB — COMPREHENSIVE METABOLIC PANEL
ALT: 22 U/L (ref 0–44)
AST: 21 U/L (ref 15–41)
Albumin: 3.6 g/dL (ref 3.5–5.0)
Alkaline Phosphatase: 52 U/L (ref 38–126)
Anion gap: 6 (ref 5–15)
BUN: 12 mg/dL (ref 6–20)
CO2: 22 mmol/L (ref 22–32)
Calcium: 9.4 mg/dL (ref 8.9–10.3)
Chloride: 109 mmol/L (ref 98–111)
Creatinine, Ser: 0.68 mg/dL (ref 0.44–1.00)
GFR, Estimated: >60 mL/min (ref >60)
Glucose, Bld: 98 mg/dL (ref 70–99)
Potassium: 4.5 mmol/L (ref 3.5–5.1)
Sodium: 137 mmol/L (ref 135–145)
Total Bilirubin: 0.4 mg/dL (ref 0.3–1.2)
Total Protein: 6.7 g/dL (ref 6.5–8.1)

## 2022-11-29 LAB — URINALYSIS, DIPSTICK ONLY
Bilirubin Urine: NEGATIVE
Glucose, UA: NEGATIVE mg/dL
Hgb urine dipstick: NEGATIVE
Ketones, ur: NEGATIVE mg/dL
Leukocytes,Ua: NEGATIVE
Nitrite: NEGATIVE
Protein, ur: NEGATIVE mg/dL
Specific Gravity, Urine: 1.015 (ref 1.005–1.030)
pH: 5 (ref 5.0–8.0)

## 2022-11-29 LAB — CBC WITH DIFFERENTIAL/PLATELET
Abs Immature Granulocytes: 0.01 10*3/uL (ref 0.00–0.07)
Basophils Absolute: 0.1 10*3/uL (ref 0.0–0.1)
Basophils Relative: 1 %
Eosinophils Absolute: 0.3 10*3/uL (ref 0.0–0.5)
Eosinophils Relative: 4 %
HCT: 42.8 % (ref 36.0–46.0)
Hemoglobin: 14.4 g/dL (ref 12.0–15.0)
Immature Granulocytes: 0 %
Lymphocytes Relative: 26 %
Lymphs Abs: 1.8 10*3/uL (ref 0.7–4.0)
MCH: 34.9 pg — ABNORMAL HIGH (ref 26.0–34.0)
MCHC: 33.6 g/dL (ref 30.0–36.0)
MCV: 103.6 fL — ABNORMAL HIGH (ref 80.0–100.0)
Monocytes Absolute: 0.5 10*3/uL (ref 0.1–1.0)
Monocytes Relative: 6 %
Neutro Abs: 4.5 10*3/uL (ref 1.7–7.7)
Neutrophils Relative %: 63 %
Platelets: 230 10*3/uL (ref 150–400)
RBC: 4.13 MIL/uL (ref 3.87–5.11)
RDW: 13.2 % (ref 11.5–15.5)
WBC: 7.1 10*3/uL (ref 4.0–10.5)
nRBC: 0 % (ref 0.0–0.2)

## 2022-11-29 LAB — MAGNESIUM: Magnesium: 2.1 mg/dL (ref 1.7–2.4)

## 2022-11-29 MED ORDER — SODIUM CHLORIDE 0.9% FLUSH
10.0000 mL | Freq: Once | INTRAVENOUS | Status: AC
  Administered 2022-11-29: 10 mL via INTRAVENOUS

## 2022-11-29 MED ORDER — ALTEPLASE 2 MG IJ SOLR
2.0000 mg | Freq: Once | INTRAMUSCULAR | Status: AC
  Administered 2022-11-29: 2 mg
  Filled 2022-11-29: qty 2

## 2022-11-29 MED ORDER — HEPARIN SOD (PORK) LOCK FLUSH 100 UNIT/ML IV SOLN
500.0000 [IU] | Freq: Once | INTRAVENOUS | Status: AC
  Administered 2022-11-29: 500 [IU] via INTRAVENOUS

## 2022-11-29 NOTE — Patient Instructions (Signed)
[facility]  Discharge Instructions: Thank you for choosing [facility] to provide your oncology and hematology care.  If you have a lab appointment with the [facility], please come in thru the Main Entrance and check in at the main information desk.  Wear comfortable clothing and clothing appropriate for easy access to any Portacath or PICC line.   We strive to give you quality time with your provider. You may need to reschedule your appointment if you arrive late (15 or more minutes).  Arriving late affects you and other patients whose appointments are after yours.  Also, if you miss three or more appointments without notifying the office, you may be dismissed from the clinic at the provider's discretion.      For prescription refill requests, have your pharmacy contact our office and allow 72 hours for refills to be completed.    Today you received the following chemotherapy and/or immunotherapy agents No treatment      To help prevent nausea and vomiting after your treatment, we encourage you to take your nausea medication as directed.  BELOW ARE SYMPTOMS THAT SHOULD BE REPORTED IMMEDIATELY: *FEVER GREATER THAN 100.4 F (38 C) OR HIGHER *CHILLS OR SWEATING *NAUSEA AND VOMITING THAT IS NOT CONTROLLED WITH YOUR NAUSEA MEDICATION *UNUSUAL SHORTNESS OF BREATH *UNUSUAL BRUISING OR BLEEDING *URINARY PROBLEMS (pain or burning when urinating, or frequent urination) *BOWEL PROBLEMS (unusual diarrhea, constipation, pain near the anus) TENDERNESS IN MOUTH AND THROAT WITH OR WITHOUT PRESENCE OF ULCERS (sore throat, sores in mouth, or a toothache) UNUSUAL RASH, SWELLING OR PAIN  UNUSUAL VAGINAL DISCHARGE OR ITCHING   Items with * indicate a potential emergency and should be followed up as soon as possible or go to the Emergency Department if any problems should occur.  Please show the CHEMOTHERAPY ALERT CARD or IMMUNOTHERAPY ALERT CARD at check-in to the  Emergency Department and triage nurse.  Should you have questions after your visit or need to cancel or reschedule your appointment, please contact [facility] [phone]  and follow the prompts.  Office hours are 8:00 a.m. to 4:30 p.m. Monday - Friday. Please note that voicemails left after 4:00 p.m. may not be returned until the following business day.  We are closed weekends and major holidays. You have access to a nurse at all times for urgent questions. Please call the main number to the clinic [phone] and follow the prompts.  For any non-urgent questions, you may also contact your provider using MyChart. We now offer e-Visits for anyone 20 and older to request care online for non-urgent symptoms. For details visit [email or web address].   Also download the MyChart app! Go to the app store, search "MyChart", open the app, select [facility], and log in with your MyChart username and password.

## 2022-11-29 NOTE — Progress Notes (Signed)
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  13 cycles of FOLFOXIRI and bevacizumab completed on 11/15/2022  NGS Results: not done  CURRENT THERAPY: Xeloda with radiation therapy  BRIEF ONCOLOGIC HISTORY:  Oncology History  Rectal cancer ([place])  03/28/2022 Initial Diagnosis   Rectal cancer ([place])   05/29/2022 - 07/26/2022 Chemotherapy   Patient is on Treatment Plan : COLORECTAL FOLFOXIRI + Bevacizumab q14d     05/29/2022 -  Chemotherapy   Patient is on Treatment Plan : COLORECTAL FOLFOXIRI + Bevacizumab q14d      Genetic Testing   No pathogenic variants identified on the [facility] CustomNext+RNA panel. The report date is 09/08/2022.  The CustomNext-Cancer+RNAinsight panel offered by [facility] includes sequencing and rearrangement analysis for the following 47 genes:  APC, ATM, AXIN2, BARD1, BMPR1A, BRCA1, BRCA2, BRIP1, CDH1, CDK4, CDKN2A, CHEK2, DICER1, EPCAM, GREM1, HOXB13, MEN1, MLH1, MSH2, MSH3, MSH6, MUTYH, NBN, NF1, NF2, NTHL1, PALB2, PMS2, POLD1, POLE, PTEN, RAD51C, RAD51D, RECQL, RET, SDHA, SDHAF2, SDHB, SDHC, SDHD, SMAD4, SMARCA4, STK11, TP53, TSC1, TSC2, and VHL.  RNA data is routinely analyzed for use in variant interpretation for all genes.     CANCER STAGING:  Cancer Staging  Rectal cancer [facility]) Staging form: Colon and Rectum, AJCC 8th Edition - Clinical stage from 03/28/2022: Stage IVA (cTX, cN1, cM1a) - Unsigned   INTERVAL HISTORY:  Ms. Shaneika Rossa, a 41 y.o. female, seen for follow-up of rectal cancer.  Reports energy levels of 85%.  Continuing to work full-time job.  Numbness and tingling in the fingertips and toes has been stable.  She has met with Dr. Lynnette Caffey at radiation oncology.  REVIEW OF SYSTEMS:  Review of  Systems  Neurological:  Positive for numbness (Constant tingling in the fingertips and toes).  Psychiatric/Behavioral:  Negative for sleep disturbance.   All other systems reviewed and are negative.   PAST MEDICAL/SURGICAL HISTORY:  Past Medical History:  Diagnosis Date   Cancer [facility])    Depression, major, single episode, mild ([place])    Port-A-Cath in place 05/24/2022   Past Surgical History:  Procedure Laterality Date   BIOPSY  03/21/2022   Procedure: BIOPSY;  Surgeon: [name], DO;  Location: [facility] ENDO SU[facility];  Service: Endoscopy;;   COLONOSCOPY WITH PROPOFOL N/A 03/21/2022   Procedure: COLONOSCOPY WITH PROPOFOL;  Surgeon: [name], DO;  Location: [facility] ENDO SUITE;  Service: Endoscopy;  Laterality: N/A;  8:30am   FLEXIBLE SIGMOIDOSCOPY N/A 05/19/2022   Procedure: FLEXIBLE SIGMOIDOSCOPY WITH TATTOO INJECTION;  Surgeon: Leighton Ruff, MD;  Location: [facility] ORS;  Service: General;  Laterality: N/A;   LAPAROSCOPIC DIVERTED COLOSTOMY N/A 05/19/2022   Procedure: LAPAROSCOPIC DIVERTED OSTOMY;  Surgeon: Leighton Ruff, MD;  Location: [facility] ORS;  Service: General;  Laterality: N/A;   PORTACATH PLACEMENT Right 05/19/2022   Procedure: INSERTION PORT-A-CATH;  Surgeon: Leighton Ruff, MD;  Location: [facility] ORS;  Service: General;  Laterality: Right;    SOCIAL HISTORY:  Social History   Socioeconomic History   Marital status: Married    Spouse name: Not on file   Number of children: Not on file   Years of education: Not on file   Highest education level: Not on file  Occupational History   Not on file  Tobacco Use  Smoking status: Every Day    Packs/day: 2.00    Years: 27.00    Total pack years: 54.00    Types: Cigarettes   Smokeless tobacco: Never  Vaping Use   Vaping Use: Never used  Substance and Sexual Activity   Alcohol use: No   Drug use: No   Sexual activity: Yes    Birth control/protection: Injection  Other Topics Concern   Not on file  Social History Narrative   Not on  file   Social Determinants of Health   Financial Resource Strain: Not on file  Food Insecurity: Not on file  Transportation Needs: Not on file  Physical Activity: Not on file  Stress: Not on file  Social Connections: Not on file  Intimate Partner Violence: Not on file    FAMILY HISTORY:  Family History  Problem Relation Age of Onset   Heart disease Mother    Diabetes Mother    Hearing loss Father    Heart disease Father    Hyperlipidemia Father    Hypertension Father    Stroke Father    Arthritis Father    Diabetes Father    Learning disabilities Brother    Leukemia Maternal Uncle        d. 60s   Colon cancer Paternal Aunt        dx 42s   Diabetes Maternal Grandmother    Heart disease Maternal Grandmother    Diabetes Maternal Grandfather    Diabetes Paternal Grandmother    Diabetes Paternal Grandfather    Breast cancer Cousin 35    CURRENT MEDICATIONS:  Current Outpatient Medications  Medication Sig Dispense Refill   Bevacizumab (AVASTIN IV) Inject into the vein every 14 (fourteen) days.     Calcium-Cholecalciferol-Zinc 650-20-5.5 MG-MCG-MG CHEW Chew by mouth.     capecitabine (XELODA) 500 MG tablet Take 3 tablets (1,500 mg total) by mouth 2 (two) times daily. Take within 30 minutes after meals. Take only on the days of radiation therapy. 180 tablet 3   cetirizine (ZYRTEC) 10 MG tablet Take 10 mg by mouth daily.     fluorouracil CALGB 42683 2,400 mg/m2 in sodium chloride 0.9 % 150 mL Inject 2,400 mg/m2 into the vein over 48 hr.     FLUOROURACIL IV Inject into the vein every 14 (fourteen) days.     gabapentin (NEURONTIN) 300 MG capsule Take 1 capsule (300 mg total) by mouth 3 (three) times daily. 30 capsule 3   HYDROcodone-acetaminophen (NORCO) 5-325 MG tablet Take 1 tablet by mouth every 8 (eight) hours as needed for moderate pain. 90 tablet 0   ibuprofen (ADVIL) 200 MG tablet Take 1,000 mg by mouth every 6 (six) hours as needed for moderate pain.     IRINOTECAN HCL  IV Inject into the vein every 14 (fourteen) days.     LEUCOVORIN CALCIUM IV Inject into the vein every 14 (fourteen) days.     medroxyPROGESTERone (DEPO-PROVERA) 150 MG/ML injection INJECT 1 ML (150 MG TOTAL) INTO THE MUSCLE EVERY 3 (THREE) MONTHS 1 mL 3   Multiple Vitamin (MULTIVITAMIN) tablet Take 1 tablet by mouth daily.     OXALIPLATIN IV Inject into the vein every 14 (fourteen) days.     Current Facility-Administered Medications  Medication Dose Route Frequency Provider Last Rate Last Admin   medroxyPROGESTERone (DEPO-PROVERA) injection 150 mg  150 mg Intramuscular Q90 days Evelina Dun A, FNP   150 mg at 09/21/22 1619    ALLERGIES:  Allergies  Allergen Reactions  Wellbutrin [Bupropion]     insomnia   Tramadol Rash    PHYSICAL EXAM:  Performance status (ECOG): 0 - Asymptomatic  There were no vitals filed for this visit. Wt Readings from Last 3 Encounters:  11/15/22 164 lb 9.6 oz (74.7 kg)  10/31/22 160 lb 3.2 oz (72.7 kg)  10/16/22 156 lb 8 oz (71 kg)   Physical Exam Vitals reviewed.  Constitutional:      Appearance: Normal appearance.  Cardiovascular:     Rate and Rhythm: Normal rate and regular rhythm.     Pulses: Normal pulses.     Heart sounds: Normal heart sounds.  Pulmonary:     Effort: Pulmonary effort is normal.     Breath sounds: Normal breath sounds.  Neurological:     General: No focal deficit present.     Mental Status: She is alert and oriented to person, place, and time.  Psychiatric:        Mood and Affect: Mood normal.        Behavior: Behavior normal.     LABORATORY DATA:  I have reviewed the labs as listed.     Latest Ref Rng & Units 11/15/2022    8:17 AM 10/31/2022    7:59 AM 10/16/2022    9:05 AM  CBC  WBC 4.0 - 10.5 K/uL 6.7  8.1  8.1   Hemoglobin 12.0 - 15.0 g/dL 13.6  14.4  13.8   Hematocrit 36.0 - 46.0 % 40.6  42.7  40.9   Platelets 150 - 400 K/uL 235  236  215       Latest Ref Rng & Units 11/15/2022    8:17 AM 10/31/2022     7:59 AM 10/16/2022    9:05 AM  CMP  Glucose 70 - 99 mg/dL 84  113  94   BUN 6 - 20 mg/dL _0 Creatinine 0.44 - 1.00 mg/dL 0.72  0.69  0.67   Sodium 135 - 145 mmol/L 140  137  138   Potassium 3.5 - 5.1 mmol/L 4.4  4.0  4.1   Chloride 98 - 111 mmol/L 109  107  108   CO2 22 - 32 mmol/L _1 Calcium 8.9 - 10.3 mg/dL 9.1  9.0  9.2   Total Protein 6.5 - 8.1 g/dL 6.7  6.7  6.9   Total Bilirubin 0.3 - 1.2 mg/dL 0.3  0.4  0.4   Alkaline Phos 38 - 126 U/L 55  53  60   AST 15 - 41 U/L _2 ALT 0 - 44 U/L _3 DIAGNOSTIC IMAGING:  I have independently reviewed the scans and discussed with the patient. MR LIVER W WO CONTRAST  Result Date: 11/12/2022 CLINICAL DATA:  Metastatic rectal cancer restaging EXAM: MRI ABDOMEN WITHOUT AND WITH CONTRAST TECHNIQUE: Multiplanar multisequence MR imaging of the abdomen was performed both before and after the administration of intravenous contrast. CONTRAST:  7.8m GADAVIST GADOBUTROL 1 MMOL/ML IV SOLN COMPARISON:  Multiple exams, including 04/01/2022 and CT scan 08/14/2022 FINDINGS: Lower chest: Unremarkable Hepatobiliary: Central arterial phase enhancing lesion measuring 3.7 by 2.9 cm on image 27 of series 11 is roughly stable from 04/01/2022 and becomes isointense on later phase images. This lesion was not previously hypermetabolic and is most compatible with focal nodular hyperplasia. A very similar lesion in the dome of the right hepatic lobe is present measuring  3.6 by 3.0 cm on image 14 series 12. It is possible that this represents transient hepatic attenuation difference or focal nodular hyperplasia, this lesion was not previously hypermetabolic. This is not a characteristic appearance for metastatic lesion. Probable transient hepatic attenuation difference laterally in the right hepatic lobe on image 27 of series 11 measuring 2.0 by 2.6 cm, with arterial phase enhancement and subsequent isoenhancement. The small rim  enhancing lesions in the liver shown on 04/01/2022 are currently not readily apparent. No biliary dilatation.  Gallbladder unremarkable. Pancreas:  Unremarkable Spleen:  Unremarkable Adrenals/Urinary Tract:  Unremarkable Stomach/Bowel: Left colostomy. Vascular/Lymphatic:  Unremarkable Other:  No supplemental non-categorized findings. Musculoskeletal: Central disc protrusion at L3-4. IMPRESSION: 1. Three liver lesions are identified, one of which has imaging characteristics most compatible with focal nodular hyperplasia. The other 2 lesions are likely transient hepatic attenuation difference and/or FNH. The lesions were not previously hypermetabolic. 2. The previous small rim enhancing lesions in the liver shown on prior MRI are not readily apparent on today's exam, presumably reflecting response to therapy. 3. Central disc protrusion at L3-4. 4. Left colostomy. Electronically Signed   By: Van Clines M.D.   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  Stools have become bloody/mucousy lately. - She also reported pain in the tailbone region since March 2023.  She also has right-sided lower back pain. - 50 pound weight loss in the last 9 months, part of weight loss was intentional.  She cut back on eating sweets and lost taste to sweets after COVID infection. - CT [facility] with contrast on 03/08/2022: Irregular circumferential masslike wall thickening of sigmoid colon/rectum with adjacent perirectal adenopathy.  Multiple small hypodense lesions in the liver, largest 2.9 cm in the central aspect of the liver, question metastatic disease. - Colonoscopy on 03/21/2022 by Dr. Abbey Chatters: Fungating infiltrative nearly completely obstructing mass in the rectosigmoid colon, mass was circumferential measuring 4 cm in length. - Pathology: Rectal mass biopsy consistent with invasive moderately differentiated  adenocarcinoma.  As there is very scant invasive tumor, MSI studies were deferred. - PET scan (03/23/2022): Hypermetabolic rectal primary long-segment with SUV 14.3.  Left posterior perirectal lymph node 7 mm with SUV 2.9.  Multiple tiny foci of hepatic hypermetabolism. - MRI of the liver (04/01/2022): Multiple small hypovascular rim-enhancing liver lesions, predominantly in the right hepatic lobe measuring up to 1.1 cm.  3.3 cm hypervascular mass in the central liver, most consistent with FNH/hepatic adenoma. - Liver biopsy (04/20/2022): Metastatic moderately differentiated colonic adenocarcinoma with mucinous features - NGS testing shows K-ras G12 R mutation, PIK3CA exon 21 mutation.  MS-stable.  TMB-low. - Cycle 1 of FOLFOXIRI on 05/29/2022, bevacizumab added during cycle 2 -MRI pelvis (10/23/2022): Known metastatic rectal cancer with persistent rectal narrowing and signs of involvement of posterior cervical stroma and anterior peritoneal reflection.  There is evidence of residual tumor in the posterior cervix.  Persistent presacral edema not substantially changed.   Social/family history: - She is separated and is seen today with her daughter.  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  She is current active smoker, 1 pack/day for 27 years.  Denies drinking alcohol. - Paternal aunt had colon cancer.  Maternal cousin has breast cancer.  Maternal uncle had leukemia and maternal cousin had leukemia.   PLAN:  Metastatic rectal cancer to the liver: - MRI of the liver on  11/10/2022 showed 3 benign lesions and previous small rim-enhancing metastatic lesions in the liver are no longer seen. - Last CEA was 3.7. - Dr. Marcello Moores felt that if liver lesions are well-controlled, she will likely benefit from chemoradiation therapy followed by surgery. - She met with Dr. Lynnette Caffey and is planning to start radiation on 12/11/2022. - We talked about chemotherapy with Xeloda 3 tablets twice daily Monday through Friday throughout  the course of radiation. - We talked about side effects including but not limited to diarrhea, cytopenias, fatigue, nausea, hand-foot skin reaction in detail. - We will hold chemotherapy today. - Reviewed labs today which showed normal LFTs and electrolytes.  CBC was grossly normal.  UA was negative for protein. - She will start Xeloda on 12/11/2022.  I will see her back in the week of 12/18/2022.  2.  Sacral/right lower back pain: - This pain resolved completely after chemotherapy was started.  3.  Peripheral neuropathy: - Fingertips and feet feel numb when applying pressure.  No neuropathic pains.   Orders placed this encounter:  No orders of the defined types were placed in this encounter.      Derek Jack, MD [facility] [phone]

## 2022-11-29 NOTE — Patient Instructions (Addendum)
[facility] at [facility] Discharge Instructions   You were seen and examined today by Dr. Delton Coombes.  He reviewed the results of your lab work which are normal.   We will plan to start chemoradiation the week of January 8th. You should start taking Xeloda pills twice a day on days of radiation. There should be at least 10-12 hours between doses. DO NOT take on the days you are not taking radiation.   The pills can cause more nausea. Use your compazine as needed. Also make sure you apply a good thick moisturizer to your hands and feet often to prevent blistering and peeling to these areas that the pills can cause.   Return as scheduled.    Thank you for choosing [facility] at [facility] to provide your oncology and hematology care.  To afford each patient quality time with our provider, please arrive at least 15 minutes before your scheduled appointment time.   If you have a lab appointment with the [facility] please come in thru the Main Entrance and check in at the main information desk.  You need to re-schedule your appointment should you arrive 10 or more minutes late.  We strive to give you quality time with our providers, and arriving late affects you and other patients whose appointments are after yours.  Also, if you no show three or more times for appointments you may be dismissed from the clinic at the providers discretion.     Again, thank you for choosing [facility].  Our hope is that these requests will decrease the amount of time that you wait before being seen by our physicians.       _____________________________________________________________  Should you have questions after your visit to [facility], please contact our office at [phone] and follow the prompts.  Our office hours are 8:00 a.m. and 4:30 p.m. Monday - Friday.  Please note that voicemails left after 4:00 p.m. may not be returned  until the following business day.  We are closed weekends and major holidays.  You do have access to a nurse 24-7, just call the main number to the clinic [phone] and do not press any options, hold on the line and a nurse will answer the phone.    For prescription refill requests, have your pharmacy contact our office and allow 72 hours.    Due to Covid, you will need to wear a mask upon entering the hospital. If you do not have a mask, a mask will be given to you at the Main Entrance upon arrival. For doctor visits, patients may have 1 support person age 36 or older with them. For treatment visits, patients can not have anyone with them due to social distancing guidelines and our immunocompromised population.

## 2022-11-29 NOTE — Progress Notes (Signed)
No tx today per [place]. She will be starting chemoradiation with Xeloda on 1/8.   No blood return noted after Alteplase.  Patient unwilling to wait another 30 minutes to check for blood return.  Alteplase aspirated from [place].  Port flushed without difficulty with no bruising or swelling noted at site.  Needle removed intact, band aid applied.  VSS with discharge and left in satisfactory condition with no s/s of distress noted.

## 2022-11-30 ENCOUNTER — Other Ambulatory Visit: Payer: Self-pay

## 2022-12-01 ENCOUNTER — Inpatient Hospital Stay: Payer: BC Managed Care – PPO

## 2022-12-08 ENCOUNTER — Ambulatory Visit (INDEPENDENT_AMBULATORY_CARE_PROVIDER_SITE_OTHER): Payer: BC Managed Care – PPO | Admitting: *Deleted

## 2022-12-08 DIAGNOSIS — Z3042 Encounter for surveillance of injectable contraceptive: Secondary | ICD-10-CM

## 2022-12-08 MED ORDER — MEDROXYPROGESTERONE ACETATE 150 MG/ML IM SUSP
150.0000 mg | Freq: Once | INTRAMUSCULAR | Status: AC
  Administered 2022-12-08: 150 mg via INTRAMUSCULAR

## 2022-12-08 NOTE — Progress Notes (Signed)
Medroxyprogesterone Acetate injection given right deltoid, intramuscular. Patient tolerated well.

## 2022-12-18 ENCOUNTER — Inpatient Hospital Stay: Payer: BC Managed Care – PPO | Attending: Hematology | Admitting: Hematology

## 2022-12-18 ENCOUNTER — Inpatient Hospital Stay: Payer: BC Managed Care – PPO

## 2022-12-18 VITALS — BP 125/65 | HR 94 | Temp 97.6°F | Resp 18 | Wt 175.0 lb

## 2022-12-18 DIAGNOSIS — R197 Diarrhea, unspecified: Secondary | ICD-10-CM | POA: Diagnosis not present

## 2022-12-18 DIAGNOSIS — Z803 Family history of malignant neoplasm of breast: Secondary | ICD-10-CM | POA: Insufficient documentation

## 2022-12-18 DIAGNOSIS — C787 Secondary malignant neoplasm of liver and intrahepatic bile duct: Secondary | ICD-10-CM | POA: Diagnosis not present

## 2022-12-18 DIAGNOSIS — Z95828 Presence of other vascular implants and grafts: Secondary | ICD-10-CM

## 2022-12-18 DIAGNOSIS — C20 Malignant neoplasm of rectum: Secondary | ICD-10-CM | POA: Insufficient documentation

## 2022-12-18 DIAGNOSIS — F17210 Nicotine dependence, cigarettes, uncomplicated: Secondary | ICD-10-CM | POA: Diagnosis not present

## 2022-12-18 DIAGNOSIS — Z800 Family history of malignant neoplasm of digestive organs: Secondary | ICD-10-CM | POA: Insufficient documentation

## 2022-12-18 DIAGNOSIS — R200 Anesthesia of skin: Secondary | ICD-10-CM | POA: Diagnosis not present

## 2022-12-18 DIAGNOSIS — K5900 Constipation, unspecified: Secondary | ICD-10-CM | POA: Insufficient documentation

## 2022-12-18 DIAGNOSIS — Z806 Family history of leukemia: Secondary | ICD-10-CM | POA: Insufficient documentation

## 2022-12-18 DIAGNOSIS — G629 Polyneuropathy, unspecified: Secondary | ICD-10-CM | POA: Diagnosis not present

## 2022-12-18 LAB — MAGNESIUM: Magnesium: 2 mg/dL (ref 1.7–2.4)

## 2022-12-18 LAB — CBC WITH DIFFERENTIAL/PLATELET
Abs Immature Granulocytes: 0.08 10*3/uL — ABNORMAL HIGH (ref 0.00–0.07)
Basophils Absolute: 0.1 10*3/uL (ref 0.0–0.1)
Basophils Relative: 1 %
Eosinophils Absolute: 0.3 10*3/uL (ref 0.0–0.5)
Eosinophils Relative: 4 %
HCT: 36.5 % (ref 36.0–46.0)
Hemoglobin: 12.4 g/dL (ref 12.0–15.0)
Immature Granulocytes: 1 %
Lymphocytes Relative: 23 %
Lymphs Abs: 1.7 10*3/uL (ref 0.7–4.0)
MCH: 34.9 pg — ABNORMAL HIGH (ref 26.0–34.0)
MCHC: 34 g/dL (ref 30.0–36.0)
MCV: 102.8 fL — ABNORMAL HIGH (ref 80.0–100.0)
Monocytes Absolute: 0.4 10*3/uL (ref 0.1–1.0)
Monocytes Relative: 5 %
Neutro Abs: 5 10*3/uL (ref 1.7–7.7)
Neutrophils Relative %: 66 %
Platelets: 224 10*3/uL (ref 150–400)
RBC: 3.55 MIL/uL — ABNORMAL LOW (ref 3.87–5.11)
RDW: 12.9 % (ref 11.5–15.5)
WBC: 7.5 10*3/uL (ref 4.0–10.5)
nRBC: 0 % (ref 0.0–0.2)

## 2022-12-18 LAB — COMPREHENSIVE METABOLIC PANEL
ALT: 11 U/L (ref 0–44)
AST: 14 U/L — ABNORMAL LOW (ref 15–41)
Albumin: 3.6 g/dL (ref 3.5–5.0)
Alkaline Phosphatase: 45 U/L (ref 38–126)
Anion gap: 7 (ref 5–15)
BUN: 11 mg/dL (ref 6–20)
CO2: 22 mmol/L (ref 22–32)
Calcium: 8.4 mg/dL — ABNORMAL LOW (ref 8.9–10.3)
Chloride: 108 mmol/L (ref 98–111)
Creatinine, Ser: 0.67 mg/dL (ref 0.44–1.00)
GFR, Estimated: >60 mL/min (ref >60)
Glucose, Bld: 80 mg/dL (ref 70–99)
Potassium: 3.8 mmol/L (ref 3.5–5.1)
Sodium: 137 mmol/L (ref 135–145)
Total Bilirubin: 0.4 mg/dL (ref 0.3–1.2)
Total Protein: 6.5 g/dL (ref 6.5–8.1)

## 2022-12-18 MED ORDER — HEPARIN SOD (PORK) LOCK FLUSH 100 UNIT/ML IV SOLN
500.0000 [IU] | Freq: Once | INTRAVENOUS | Status: AC
  Administered 2022-12-18: 500 [IU] via INTRAVENOUS

## 2022-12-18 MED ORDER — SODIUM CHLORIDE 0.9% FLUSH
10.0000 mL | Freq: Once | INTRAVENOUS | Status: AC
  Administered 2022-12-18: 10 mL via INTRAVENOUS

## 2022-12-18 NOTE — Progress Notes (Signed)
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  13 cycles of FOLFOXIRI and bevacizumab completed on 11/15/2022  NGS Results: not done  CURRENT THERAPY: Xeloda with radiation therapy  BRIEF ONCOLOGIC HISTORY:  Oncology History  Rectal cancer (Terri Mckinney)  03/28/2022 Initial Diagnosis   Rectal cancer (Terri Mckinney)   05/29/2022 - 07/26/2022 Chemotherapy   Patient is on Treatment Plan : COLORECTAL FOLFOXIRI + Bevacizumab q14d     05/29/2022 -  Chemotherapy   Patient is on Treatment Plan : COLORECTAL FOLFOXIRI + Bevacizumab q14d      Genetic Testing   No pathogenic variants identified on the [facility] CustomNext+RNA panel. The report date is 09/08/2022.  The CustomNext-Cancer+RNAinsight panel offered by Terri Mckinney includes sequencing and rearrangement analysis for the following 47 genes:  APC, ATM, AXIN2, BARD1, BMPR1A, BRCA1, BRCA2, BRIP1, CDH1, CDK4, CDKN2A, CHEK2, DICER1, EPCAM, GREM1, HOXB13, MEN1, MLH1, MSH2, MSH3, MSH6, MUTYH, NBN, NF1, NF2, NTHL1, PALB2, PMS2, POLD1, POLE, PTEN, RAD51C, RAD51D, RECQL, RET, SDHA, SDHAF2, SDHB, SDHC, SDHD, SMAD4, SMARCA4, STK11, TP53, TSC1, TSC2, and VHL.  RNA data is routinely analyzed for use in variant interpretation for all genes.     CANCER STAGING:  Cancer Staging  Rectal cancer Terri Mckinney) Staging form: Colon and Rectum, AJCC 8th Edition - Clinical stage from 03/28/2022: Stage IVA (cTX, cN1, cM1a) - Unsigned   INTERVAL HISTORY:  [name], a 42 y.o. female, seen for follow-up of rectal cancer.  She has started radiation therapy on 12/11/2022.  She is taking Xeloda 3 tablets at 7:30 AM and 3 tablets at 5:30 PM.  Denied any nausea or vomiting.  Reports some constipation for the last 1 day.  Numbness in the fingertips  and toes is stable.  REVIEW OF SYSTEMS:  Review of Systems  Gastrointestinal:  Positive for constipation.  Neurological:  Positive for numbness (Constant tingling in the fingertips and toes).  Psychiatric/Behavioral:  Negative for sleep disturbance.   All other systems reviewed and are negative.   PAST MEDICAL/SURGICAL HISTORY:  Past Medical History:  Diagnosis Date   Cancer Terri Mckinney)    Depression, major, single episode, mild (Terri Mckinney)    Port-A-Cath in place 05/24/2022   Past Surgical History:  Procedure Laterality Date   BIOPSY  03/21/2022   Procedure: BIOPSY;  Surgeon: Terri Harman, DO;  [facility];  Service: Endoscopy;;   COLONOSCOPY WITH PROPOFOL N/A 03/21/2022   Procedure: COLONOSCOPY WITH PROPOFOL;  Surgeon: Terri Harman, DO;  Location: [facility];  Service: Endoscopy;  Laterality: N/A;  8:30am   FLEXIBLE SIGMOIDOSCOPY N/A 05/19/2022   Procedure: FLEXIBLE SIGMOIDOSCOPY WITH TATTOO INJECTION;  Surgeon: Terri Ruff, MD;  Location: [facility];  Service: General;  Laterality: N/A;   LAPAROSCOPIC DIVERTED COLOSTOMY N/A 05/19/2022   Procedure: LAPAROSCOPIC DIVERTED OSTOMY;  Surgeon: Terri Ruff, MD;  Location: [facility];  Service: General;  Laterality: N/A;   PORTACATH PLACEMENT Right 05/19/2022   Procedure: INSERTION PORT-A-CATH;  Surgeon: Terri Ruff, MD;  Location: [facility];  Service: General;  Laterality: Right;    SOCIAL HISTORY:  Social History   Socioeconomic History   Marital status: Married    Spouse name: Not on file   Number of children: Not on file   Years of education: Not on  file   Highest education level: Not on file  Occupational History   Not on file  Tobacco Use   Smoking status: Every Day    Packs/day: 2.00    Years: 27.00    Total pack years: 54.00    Types: Cigarettes   Smokeless tobacco: Never  Vaping Use   Vaping Use: Never used  Substance and Sexual Activity   Alcohol use: No   Drug use: No   Sexual activity: Yes    Birth  control/protection: Injection  Other Topics Concern   Not on file  Social History Narrative   Not on file   Social Determinants of Health   Financial Resource Strain: Not on file  Food Insecurity: Not on file  Transportation Needs: Not on file  Physical Activity: Not on file  Stress: Not on file  Social Connections: Not on file  Intimate Partner Violence: Not on file    FAMILY HISTORY:  Family History  Problem Relation Age of Onset   Heart disease Mother    Diabetes Mother    Hearing loss Father    Heart disease Father    Hyperlipidemia Father    Hypertension Father    Stroke Father    Arthritis Father    Diabetes Father    Learning disabilities Brother    Leukemia Maternal Uncle        d. 31s   Colon cancer Paternal Aunt        dx 15s   Diabetes Maternal Grandmother    Heart disease Maternal Grandmother    Diabetes Maternal Grandfather    Diabetes Paternal Grandmother    Diabetes Paternal Grandfather    Breast cancer Cousin 42    CURRENT MEDICATIONS:  Current Outpatient Medications  Medication Sig Dispense Refill   Bevacizumab (AVASTIN IV) Inject into the vein every 14 (fourteen) days.     Calcium-Cholecalciferol-Zinc 650-20-5.5 MG-MCG-MG CHEW Chew by mouth.     capecitabine (XELODA) 500 MG tablet Take 3 tablets (1,500 mg total) by mouth 2 (two) times daily. Take within 30 minutes after meals. Take only on the days of radiation therapy. 180 tablet 3   cetirizine (ZYRTEC) 10 MG tablet Take 10 mg by mouth daily.     fluorouracil CALGB 19622 2,400 mg/m2 in sodium chloride 0.9 % 150 mL Inject 2,400 mg/m2 into the vein over 48 hr.     FLUOROURACIL IV Inject into the vein every 14 (fourteen) days.     gabapentin (NEURONTIN) 300 MG capsule Take 1 capsule (300 mg total) by mouth 3 (three) times daily. 30 capsule 3   HYDROcodone-acetaminophen (NORCO) 5-325 MG tablet Take 1 tablet by mouth every 8 (eight) hours as needed for moderate pain. 90 tablet 0   ibuprofen (ADVIL)  200 MG tablet Take 1,000 mg by mouth every 6 (six) hours as needed for moderate pain.     IRINOTECAN HCL IV Inject into the vein every 14 (fourteen) days.     LEUCOVORIN CALCIUM IV Inject into the vein every 14 (fourteen) days.     medroxyPROGESTERone (DEPO-PROVERA) 150 MG/ML injection INJECT 1 ML (150 MG TOTAL) INTO THE MUSCLE EVERY 3 (THREE) MONTHS 1 mL 3   Multiple Vitamin (MULTIVITAMIN) tablet Take 1 tablet by mouth daily.     OXALIPLATIN IV Inject into the vein every 14 (fourteen) days.     Current Facility-Administered Medications  Medication Dose Route Frequency Provider Last Rate Last Admin   medroxyPROGESTERone (DEPO-PROVERA) injection 150 mg  150 mg Intramuscular Q90 days  Evelina Dun A, FNP   150 mg at 09/21/22 1619    ALLERGIES:  Allergies  Allergen Reactions   Wellbutrin [Bupropion]     insomnia   Tramadol Rash    PHYSICAL EXAM:  Performance status (ECOG): 0 - Asymptomatic  There were no vitals filed for this visit. Wt Readings from Last 3 Encounters:  12/18/22 178 lb 9.6 oz (81 kg)  11/29/22 170 lb (77.1 kg)  11/15/22 164 lb 9.6 oz (74.7 kg)   Physical Exam Vitals reviewed.  Constitutional:      Appearance: Normal appearance.  Cardiovascular:     Rate and Rhythm: Normal rate and regular rhythm.     Pulses: Normal pulses.     Heart sounds: Normal heart sounds.  Pulmonary:     Effort: Pulmonary effort is normal.     Breath sounds: Normal breath sounds.  Neurological:     General: No focal deficit present.     Mental Status: She is alert and oriented to person, place, and time.  Psychiatric:        Mood and Affect: Mood normal.        Behavior: Behavior normal.     LABORATORY DATA:  I have reviewed the labs as listed.     Latest Ref Rng & Units 11/29/2022    8:39 AM 11/15/2022    8:17 AM 10/31/2022    7:59 AM  CBC  WBC 4.0 - 10.5 K/uL 7.1  6.7  8.1   Hemoglobin 12.0 - 15.0 g/dL 14.4  13.6  14.4   Hematocrit 36.0 - 46.0 % 42.8  40.6  42.7    Platelets 150 - 400 K/uL 230  235  236       Latest Ref Rng & Units 11/29/2022    8:39 AM 11/15/2022    8:17 AM 10/31/2022    7:59 AM  CMP  Glucose 70 - 99 mg/dL 98  84  113   BUN 6 - 20 mg/dL '12  10  10   '$ Creatinine 0.44 - 1.00 mg/dL 0.68  0.72  0.69   Sodium 135 - 145 mmol/L 137  140  137   Potassium 3.5 - 5.1 mmol/L 4.5  4.4  4.0   Chloride 98 - 111 mmol/L 109  109  107   CO2 22 - 32 mmol/L '22  24  23   '$ Calcium 8.9 - 10.3 mg/dL 9.4  9.1  9.0   Total Protein 6.5 - 8.1 g/dL 6.7  6.7  6.7   Total Bilirubin 0.3 - 1.2 mg/dL 0.4  0.3  0.4   Alkaline Phos 38 - 126 U/L 52  55  53   AST 15 - 41 U/L '21  16  18   '$ ALT 0 - 44 U/L '22  19  20     '$ DIAGNOSTIC IMAGING:  I have independently reviewed the scans and discussed with the patient. No results found.   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  Stools have become bloody/mucousy lately. - She also reported pain in the tailbone region since March 2023.  She also has right-sided lower back pain. - 50 pound weight loss in the last 9 months, part of weight loss was intentional.  She cut back on eating sweets and lost taste to sweets after COVID infection. - CT [facility] with contrast on 03/08/2022: Irregular circumferential masslike wall thickening of sigmoid colon/rectum with adjacent perirectal  adenopathy.  Multiple small hypodense lesions in the liver, largest 2.9 cm in the central aspect of the liver, question metastatic disease. - Colonoscopy on 03/21/2022 by Dr. Abbey Chatters: Fungating infiltrative nearly completely obstructing mass in the rectosigmoid colon, mass was circumferential measuring 4 cm in length. - Pathology: Rectal mass biopsy consistent with invasive moderately differentiated adenocarcinoma.  As there is very scant invasive tumor, MSI studies were deferred. - PET scan (03/23/2022): Hypermetabolic rectal primary long-segment with SUV 14.3.  Left  posterior perirectal lymph node 7 mm with SUV 2.9.  Multiple tiny foci of hepatic hypermetabolism. - MRI of the liver (04/01/2022): Multiple small hypovascular rim-enhancing liver lesions, predominantly in the right hepatic lobe measuring up to 1.1 cm.  3.3 cm hypervascular mass in the central liver, most consistent with FNH/hepatic adenoma. - Liver biopsy (04/20/2022): Metastatic moderately differentiated colonic adenocarcinoma with mucinous features - NGS testing shows K-ras G12 R mutation, PIK3CA exon 21 mutation.  MS-stable.  TMB-low. - Cycle 1 of FOLFOXIRI on 05/29/2022, bevacizumab added during cycle 2 -MRI pelvis (10/23/2022): Known metastatic rectal cancer with persistent rectal narrowing and signs of involvement of posterior cervical stroma and anterior peritoneal reflection.  There is evidence of residual tumor in the posterior cervix.  Persistent presacral edema not substantially changed. - MRI liver (11/10/2022): 3 benign lesions and previous small rim-enhancing metastatic lesions in the liver are no longer seen. - Chemoradiation therapy with Xeloda started on 12/11/2022.   Social/family history: - She is separated and is seen today with her daughter.  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  She is current active smoker, 1 pack/day for 27 years.  Denies drinking alcohol. - Paternal aunt had colon cancer.  Maternal cousin has breast cancer.  Maternal uncle had leukemia and maternal cousin had leukemia.   PLAN:  Metastatic rectal cancer to the liver: - Xeloda was started on 12/11/2022 with radiation.  She is taking 3 tablets at 7:30 AM and 3 tablets at 5:30 PM. - She is not requiring antinausea medication. - Examination does not show hand-foot skin reaction or mucositis. - Labs today shows normal LFTs and electrolytes.  CBC was grossly normal with microcytosis. - I discussed preventative measures including moisturizing lotion to the palms and soles and protective measures including wearing gloves  and footwear all the time. - RTC 1 week for follow-up with repeat labs and toxicity assessment.  2.  Sacral/right lower back pain: - Pain resolved completely after chemotherapy started.  3.  Peripheral neuropathy: - Fingertips and feet feel numb all the time with no neuropathic pains.  Stable.   Orders placed this encounter:  No orders of the defined types were placed in this encounter.      Derek Jack, MD [place] [phone]

## 2022-12-18 NOTE — Patient Instructions (Addendum)
[facility]  Discharge Instructions  You were seen and examined today by Dr. Delton Coombes.  Dr. Delton Coombes discussed your most recent lab work which revealed that everything looks good.  Follow-up as scheduled in 1 week.    Thank you for choosing [facility] to provide your oncology and hematology care.   To afford each patient quality time with our provider, please arrive at least 15 minutes before your scheduled appointment time. You may need to reschedule your appointment if you arrive late (10 or more minutes). Arriving late affects you and other patients whose appointments are after yours.  Also, if you miss three or more appointments without notifying the office, you may be dismissed from the clinic at the provider's discretion.    Again, thank you for choosing [facility].  Our hope is that these requests will decrease the amount of time that you wait before being seen by our physicians.   If you have a lab appointment with the [facility] please come in thru the Main Entrance and check in at the main information desk.           _____________________________________________________________  Should you have questions after your visit to [facility], please contact our office at [phone] and follow the prompts.  Our office hours are 8:00 a.m. to 4:30 p.m. Monday - Thursday and 8:00 a.m. to 2:30 p.m. Friday.  Please note that voicemails left after 4:00 p.m. may not be returned until the following business day.  We are closed weekends and all major holidays.  You do have access to a nurse 24-7, just call the main number to the clinic [phone] and do not press any options, hold on the line and a nurse will answer the phone.    For prescription refill requests, have your pharmacy contact our office and allow 72 hours.    Masks are optional in the cancer centers. If you would like for your care team  to wear a mask while they are taking care of you, please let them know. You may have one support person who is at least 44 years old accompany you for your appointments.

## 2022-12-19 ENCOUNTER — Other Ambulatory Visit: Payer: Self-pay

## 2022-12-19 LAB — CEA: CEA: 2.5 ng/mL (ref 0.0–4.7)

## 2022-12-26 ENCOUNTER — Other Ambulatory Visit: Payer: Self-pay

## 2022-12-27 ENCOUNTER — Inpatient Hospital Stay: Payer: BC Managed Care – PPO

## 2022-12-27 ENCOUNTER — Inpatient Hospital Stay (HOSPITAL_BASED_OUTPATIENT_CLINIC_OR_DEPARTMENT_OTHER): Payer: BC Managed Care – PPO | Admitting: Hematology

## 2022-12-27 DIAGNOSIS — C787 Secondary malignant neoplasm of liver and intrahepatic bile duct: Secondary | ICD-10-CM | POA: Diagnosis not present

## 2022-12-27 DIAGNOSIS — C20 Malignant neoplasm of rectum: Secondary | ICD-10-CM

## 2022-12-27 LAB — CBC WITH DIFFERENTIAL/PLATELET
Abs Immature Granulocytes: 0.02 10*3/uL (ref 0.00–0.07)
Basophils Absolute: 0 10*3/uL (ref 0.0–0.1)
Basophils Relative: 0 %
Eosinophils Absolute: 0.4 10*3/uL (ref 0.0–0.5)
Eosinophils Relative: 7 %
HCT: 35.9 % — ABNORMAL LOW (ref 36.0–46.0)
Hemoglobin: 12.3 g/dL (ref 12.0–15.0)
Immature Granulocytes: 0 %
Lymphocytes Relative: 23 %
Lymphs Abs: 1.2 10*3/uL (ref 0.7–4.0)
MCH: 35.1 pg — ABNORMAL HIGH (ref 26.0–34.0)
MCHC: 34.3 g/dL (ref 30.0–36.0)
MCV: 102.6 fL — ABNORMAL HIGH (ref 80.0–100.0)
Monocytes Absolute: 0.4 10*3/uL (ref 0.1–1.0)
Monocytes Relative: 7 %
Neutro Abs: 3.3 10*3/uL (ref 1.7–7.7)
Neutrophils Relative %: 63 %
Platelets: 181 10*3/uL (ref 150–400)
RBC: 3.5 MIL/uL — ABNORMAL LOW (ref 3.87–5.11)
RDW: 13.7 % (ref 11.5–15.5)
WBC: 5.3 10*3/uL (ref 4.0–10.5)
nRBC: 0 % (ref 0.0–0.2)

## 2022-12-27 LAB — COMPREHENSIVE METABOLIC PANEL
ALT: 13 U/L (ref 0–44)
AST: 15 U/L (ref 15–41)
Albumin: 3.4 g/dL — ABNORMAL LOW (ref 3.5–5.0)
Alkaline Phosphatase: 40 U/L (ref 38–126)
Anion gap: 6 (ref 5–15)
BUN: 11 mg/dL (ref 6–20)
CO2: 23 mmol/L (ref 22–32)
Calcium: 8.7 mg/dL — ABNORMAL LOW (ref 8.9–10.3)
Chloride: 109 mmol/L (ref 98–111)
Creatinine, Ser: 0.71 mg/dL (ref 0.44–1.00)
GFR, Estimated: >60 mL/min (ref >60)
Glucose, Bld: 96 mg/dL (ref 70–99)
Potassium: 3.8 mmol/L (ref 3.5–5.1)
Sodium: 138 mmol/L (ref 135–145)
Total Bilirubin: 0.6 mg/dL (ref 0.3–1.2)
Total Protein: 6.2 g/dL — ABNORMAL LOW (ref 6.5–8.1)

## 2022-12-27 LAB — MAGNESIUM: Magnesium: 2 mg/dL (ref 1.7–2.4)

## 2022-12-27 MED ORDER — HEPARIN SOD (PORK) LOCK FLUSH 100 UNIT/ML IV SOLN
500.0000 [IU] | Freq: Once | INTRAVENOUS | Status: AC
  Administered 2022-12-27: 500 [IU] via INTRAVENOUS

## 2022-12-27 MED ORDER — SODIUM CHLORIDE 0.9% FLUSH
10.0000 mL | INTRAVENOUS | Status: DC | PRN
  Administered 2022-12-27: 10 mL via INTRAVENOUS

## 2022-12-27 NOTE — Patient Instructions (Addendum)
[facility] at [facility] Discharge Instructions   You were seen and examined today by Dr. Delton Coombes.  He reviewed the results of your lab work which are normal/stable.   Continue Xeloda as prescribed on days of radiation treatment.   Return as scheduled.    Thank you for choosing [facility] at [facility] to provide your oncology and hematology care.  To afford each patient quality time with our provider, please arrive at least 15 minutes before your scheduled appointment time.   If you have a lab appointment with the [facility] please come in thru the Main Entrance and check in at the main information desk.  You need to re-schedule your appointment should you arrive 10 or more minutes late.  We strive to give you quality time with our providers, and arriving late affects you and other patients whose appointments are after yours.  Also, if you no show three or more times for appointments you may be dismissed from the clinic at the providers discretion.     Again, thank you for choosing [facility].  Our hope is that these requests will decrease the amount of time that you wait before being seen by our physicians.       _____________________________________________________________  Should you have questions after your visit to [facility], please contact our office at [phone] and follow the prompts.  Our office hours are 8:00 a.m. and 4:30 p.m. Monday - Friday.  Please note that voicemails left after 4:00 p.m. may not be returned until the following business day.  We are closed weekends and major holidays.  You do have access to a nurse 24-7, just call the main number to the clinic [phone] and do not press any options, hold on the line and a nurse will answer the phone.    For prescription refill requests, have your pharmacy contact our office and allow 72 hours.    Due to Covid, you will need to wear a  mask upon entering the hospital. If you do not have a mask, a mask will be given to you at the Main Entrance upon arrival. For doctor visits, patients may have 1 support person age 26 or older with them. For treatment visits, patients can not have anyone with them due to social distancing guidelines and our immunocompromised population.

## 2022-12-27 NOTE — Progress Notes (Signed)
Patient here today for port flush / labs per MD orders.  She remained stable during port flush.  Port flushed well but would not yield any viable blood return.  Brown colored fluid returned but only 47m or less.  Unable to get bright red blood return.  Port flushed well with normal saline and heparin.  Patient aware that we will have to use cathflo next time she needs infusion.  She is okay with me drawing labs peripherally today.  [facility]  She tolerated that well. No issues.  Patient will wait on labs and see provider today.

## 2022-12-27 NOTE — Progress Notes (Signed)
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  13 cycles of FOLFOXIRI and bevacizumab completed on 11/15/2022  NGS Results: not done  CURRENT THERAPY: Xeloda with radiation therapy  BRIEF ONCOLOGIC HISTORY:  Oncology History  Rectal cancer ([place])  03/28/2022 Initial Diagnosis   Rectal cancer ([place])   05/29/2022 - 07/26/2022 Chemotherapy   Patient is on Treatment Plan : COLORECTAL FOLFOXIRI + Bevacizumab q14d     05/29/2022 -  Chemotherapy   Patient is on Treatment Plan : COLORECTAL FOLFOXIRI + Bevacizumab q14d      Genetic Testing   No pathogenic variants identified on the [facility] CustomNext+RNA panel. The report date is 09/08/2022.  The CustomNext-Cancer+RNAinsight panel offered by [facility] includes sequencing and rearrangement analysis for the following 47 genes:  APC, ATM, AXIN2, BARD1, BMPR1A, BRCA1, BRCA2, BRIP1, CDH1, CDK4, CDKN2A, CHEK2, DICER1, EPCAM, GREM1, HOXB13, MEN1, MLH1, MSH2, MSH3, MSH6, MUTYH, NBN, NF1, NF2, NTHL1, PALB2, PMS2, POLD1, POLE, PTEN, RAD51C, RAD51D, RECQL, RET, SDHA, SDHAF2, SDHB, SDHC, SDHD, SMAD4, SMARCA4, STK11, TP53, TSC1, TSC2, and VHL.  RNA data is routinely analyzed for use in variant interpretation for all genes.     CANCER STAGING:  Cancer Staging  Rectal cancer [facility]) Staging form: Colon and Rectum, AJCC 8th Edition - Clinical stage from 03/28/2022: Stage IVA (cTX, cN1, cM1a) - Unsigned   INTERVAL HISTORY:  Ms. Evana Runnels, a 42 y.o. female, seen for follow-up of rectal cancer and toxicity assessment.  She is tolerating radiation therapy very well.  She is taking Xeloda twice daily.  She reported loose stools for the past few days in her ostomy bag.  She took Imodium and got constipated.  REVIEW  OF SYSTEMS:  Review of Systems  Gastrointestinal:  Positive for diarrhea.  Neurological:  Positive for numbness (Constant tingling in the fingertips and toes).  Psychiatric/Behavioral:  Negative for sleep disturbance.   All other systems reviewed and are negative.   PAST MEDICAL/SURGICAL HISTORY:  Past Medical History:  Diagnosis Date   Cancer [facility])    Depression, major, single episode, mild ([place])    Port-A-Cath in place 05/24/2022   Past Surgical History:  Procedure Laterality Date   BIOPSY  03/21/2022   Procedure: BIOPSY;  Surgeon: [name], DO;  Location: [facility];  Service: Endoscopy;;   COLONOSCOPY WITH PROPOFOL N/A 03/21/2022   Procedure: COLONOSCOPY WITH PROPOFOL;  Surgeon: [name], DO;  Location: [facility];  Service: Endoscopy;  Laterality: N/A;  8:30am   FLEXIBLE SIGMOIDOSCOPY N/A 05/19/2022   Procedure: FLEXIBLE SIGMOIDOSCOPY WITH TATTOO INJECTION;  Surgeon: Leighton Ruff, MD;  Location: [facility] ORS;  Service: General;  Laterality: N/A;   LAPAROSCOPIC DIVERTED COLOSTOMY N/A 05/19/2022   Procedure: LAPAROSCOPIC DIVERTED OSTOMY;  Surgeon: Leighton Ruff, MD;  Location: [facility] ORS;  Service: General;  Laterality: N/A;   PORTACATH PLACEMENT Right 05/19/2022   Procedure: INSERTION PORT-A-CATH;  Surgeon: Leighton Ruff, MD;  Location: [facility] ORS;  Service: General;  Laterality: Right;    SOCIAL HISTORY:  Social History   Socioeconomic History   Marital status: Married    Spouse name: Not on file   Number of children: Not on file   Years of education: Not on file   Highest education level: Not on file  Occupational History   Not on file  Tobacco Use   Smoking status: Every Day    Packs/day: 2.00    Years: 27.00    Total pack years: 54.00    Types: Cigarettes   Smokeless tobacco: Never  Vaping Use   Vaping Use: Never used  Substance and Sexual Activity   Alcohol use: No   Drug use: No   Sexual activity: Yes    Birth control/protection: Injection   Other Topics Concern   Not on file  Social History Narrative   Not on file   Social Determinants of Health   Financial Resource Strain: Not on file  Food Insecurity: Not on file  Transportation Needs: Not on file  Physical Activity: Not on file  Stress: Not on file  Social Connections: Not on file  Intimate Partner Violence: Not on file    FAMILY HISTORY:  Family History  Problem Relation Age of Onset   Heart disease Mother    Diabetes Mother    Hearing loss Father    Heart disease Father    Hyperlipidemia Father    Hypertension Father    Stroke Father    Arthritis Father    Diabetes Father    Learning disabilities Brother    Leukemia Maternal Uncle        d. 41s   Colon cancer Paternal Aunt        dx 38s   Diabetes Maternal Grandmother    Heart disease Maternal Grandmother    Diabetes Maternal Grandfather    Diabetes Paternal Grandmother    Diabetes Paternal Grandfather    Breast cancer Cousin 40    CURRENT MEDICATIONS:  Current Outpatient Medications  Medication Sig Dispense Refill   Bevacizumab (AVASTIN IV) Inject into the vein every 14 (fourteen) days.     Calcium-Cholecalciferol-Zinc 650-20-5.5 MG-MCG-MG CHEW Chew by mouth.     capecitabine (XELODA) 500 MG tablet Take 3 tablets (1,500 mg total) by mouth 2 (two) times daily. Take within 30 minutes after meals. Take only on the days of radiation therapy. 180 tablet 3   cetirizine (ZYRTEC) 10 MG tablet Take 10 mg by mouth daily.     fluorouracil CALGB 96045 2,400 mg/m2 in sodium chloride 0.9 % 150 mL Inject 2,400 mg/m2 into the vein over 48 hr.     FLUOROURACIL IV Inject into the vein every 14 (fourteen) days.     gabapentin (NEURONTIN) 300 MG capsule Take 1 capsule (300 mg total) by mouth 3 (three) times daily. 30 capsule 3   HYDROcodone-acetaminophen (NORCO) 5-325 MG tablet Take 1 tablet by mouth every 8 (eight) hours as needed for moderate pain. 90 tablet 0   ibuprofen (ADVIL) 200 MG tablet Take 1,000 mg by  mouth every 6 (six) hours as needed for moderate pain.     IRINOTECAN HCL IV Inject into the vein every 14 (fourteen) days.     LEUCOVORIN CALCIUM IV Inject into the vein every 14 (fourteen) days.     medroxyPROGESTERone (DEPO-PROVERA) 150 MG/ML injection INJECT 1 ML (150 MG TOTAL) INTO THE MUSCLE EVERY 3 (THREE) MONTHS 1 mL 3   Multiple Vitamin (MULTIVITAMIN) tablet Take 1 tablet by mouth daily.     OXALIPLATIN IV Inject into the vein every 14 (fourteen) days.     Current Facility-Administered Medications  Medication Dose Route Frequency Provider Last Rate Last Admin   medroxyPROGESTERone (DEPO-PROVERA) injection 150 mg  150 mg Intramuscular Q90 days Evelina Dun A, FNP   150 mg at 09/21/22  1619   Facility-Administered Medications Ordered in Other Visits  Medication Dose Route Frequency Provider Last Rate Last Admin   sodium chloride flush (NS) 0.9 % injection 10 mL  10 mL Intravenous PRN Derek Jack, MD   10 mL at 12/27/22 1237    ALLERGIES:  Allergies  Allergen Reactions   Wellbutrin [Bupropion]     insomnia   Tramadol Rash    PHYSICAL EXAM:  Performance status (ECOG): 0 - Asymptomatic  There were no vitals filed for this visit. Wt Readings from Last 3 Encounters:  12/27/22 175 lb 9.6 oz (79.7 kg)  12/18/22 175 lb (79.4 kg)  11/29/22 170 lb (77.1 kg)   Physical Exam Vitals reviewed.  Constitutional:      Appearance: Normal appearance.  Cardiovascular:     Rate and Rhythm: Normal rate and regular rhythm.     Pulses: Normal pulses.     Heart sounds: Normal heart sounds.  Pulmonary:     Effort: Pulmonary effort is normal.     Breath sounds: Normal breath sounds.  Neurological:     General: No focal deficit present.     Mental Status: She is alert and oriented to person, place, and time.  Psychiatric:        Mood and Affect: Mood normal.        Behavior: Behavior normal.    LABORATORY DATA:  I have reviewed the labs as listed.     Latest Ref Rng &  Units 12/18/2022    1:41 PM 11/29/2022    8:39 AM 11/15/2022    8:17 AM  CBC  WBC 4.0 - 10.5 K/uL 7.5  7.1  6.7   Hemoglobin 12.0 - 15.0 g/dL 12.4  14.4  13.6   Hematocrit 36.0 - 46.0 % 36.5  42.8  40.6   Platelets 150 - 400 K/uL 224  230  235       Latest Ref Rng & Units 12/18/2022    1:41 PM 11/29/2022    8:39 AM 11/15/2022    8:17 AM  CMP  Glucose 70 - 99 mg/dL 80  98  84   BUN 6 - 20 mg/dL '11  12  10   '$ Creatinine 0.44 - 1.00 mg/dL 0.67  0.68  0.72   Sodium 135 - 145 mmol/L 137  137  140   Potassium 3.5 - 5.1 mmol/L 3.8  4.5  4.4   Chloride 98 - 111 mmol/L 108  109  109   CO2 22 - 32 mmol/L '22  22  24   '$ Calcium 8.9 - 10.3 mg/dL 8.4  9.4  9.1   Total Protein 6.5 - 8.1 g/dL 6.5  6.7  6.7   Total Bilirubin 0.3 - 1.2 mg/dL 0.4  0.4  0.3   Alkaline Phos 38 - 126 U/L 45  52  55   AST 15 - 41 U/L '14  21  16   '$ ALT 0 - 44 U/L '11  22  19     '$ DIAGNOSTIC IMAGING:  I have independently reviewed the scans and discussed with the patient. No results found.   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  Stools have become bloody/mucousy lately. - She also reported pain in the tailbone region since March 2023.  She also has right-sided lower back pain. - 50 pound weight loss in the last 9 months, part of weight loss was intentional.  She  cut back on eating sweets and lost taste to sweets after COVID infection. - CT [facility] with contrast on 03/08/2022: Irregular circumferential masslike wall thickening of sigmoid colon/rectum with adjacent perirectal adenopathy.  Multiple small hypodense lesions in the liver, largest 2.9 cm in the central aspect of the liver, question metastatic disease. - Colonoscopy on 03/21/2022 by Dr. Abbey Chatters: Fungating infiltrative nearly completely obstructing mass in the rectosigmoid colon, mass was circumferential measuring 4 cm in length. - Pathology: Rectal mass biopsy consistent with  invasive moderately differentiated adenocarcinoma.  As there is very scant invasive tumor, MSI studies were deferred. - PET scan (03/23/2022): Hypermetabolic rectal primary long-segment with SUV 14.3.  Left posterior perirectal lymph node 7 mm with SUV 2.9.  Multiple tiny foci of hepatic hypermetabolism. - MRI of the liver (04/01/2022): Multiple small hypovascular rim-enhancing liver lesions, predominantly in the right hepatic lobe measuring up to 1.1 cm.  3.3 cm hypervascular mass in the central liver, most consistent with FNH/hepatic adenoma. - Liver biopsy (04/20/2022): Metastatic moderately differentiated colonic adenocarcinoma with mucinous features - NGS testing shows K-ras G12 R mutation, PIK3CA exon 21 mutation.  MS-stable.  TMB-low. - Cycle 1 of FOLFOXIRI on 05/29/2022, bevacizumab added during cycle 2 -MRI pelvis (10/23/2022): Known metastatic rectal cancer with persistent rectal narrowing and signs of involvement of posterior cervical stroma and anterior peritoneal reflection.  There is evidence of residual tumor in the posterior cervix.  Persistent presacral edema not substantially changed. - MRI liver (11/10/2022): 3 benign lesions and previous small rim-enhancing metastatic lesions in the liver are no longer seen. - Chemoradiation therapy with Xeloda started on 12/11/2022.   Social/family history: - She is separated and is seen today with her daughter.  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  She is current active smoker, 1 pack/day for 27 years.  Denies drinking alcohol. - Paternal aunt had colon cancer.  Maternal cousin has breast cancer.  Maternal uncle had leukemia and maternal cousin had leukemia.   PLAN:  Metastatic rectal cancer to the liver: - She is tolerating Xeloda 3 tablets twice daily reasonably well. - She is not requiring any antinausea medicine. - Reviewed labs today which showed normal LFTs, creatinine and electrolytes.  CBC shows normal white count and platelet count.  Last  CEA is 2.5.  No clinical signs of hand-foot skin reaction. - Continue Xeloda 3 tablets twice daily Monday through Friday.  RTC 1 week for follow-up with repeat labs.  2.  Diarrhea: - She developed diarrhea this week with the colostomy bag filling up, had to be emptied twice daily.  She took Imodium and became constipated. - She was told to take 1 Imodium as needed.  3.  Peripheral neuropathy: - Fingertips and feet feel numb all the time with no neuropathic pains which is stable.   Orders placed this encounter:  No orders of the defined types were placed in this encounter.      Derek Jack, MD [place] [phone]

## 2023-01-03 ENCOUNTER — Inpatient Hospital Stay (HOSPITAL_BASED_OUTPATIENT_CLINIC_OR_DEPARTMENT_OTHER): Payer: BC Managed Care – PPO | Admitting: Hematology

## 2023-01-03 ENCOUNTER — Inpatient Hospital Stay: Payer: BC Managed Care – PPO

## 2023-01-03 DIAGNOSIS — C20 Malignant neoplasm of rectum: Secondary | ICD-10-CM | POA: Diagnosis not present

## 2023-01-03 LAB — COMPREHENSIVE METABOLIC PANEL
ALT: 12 U/L (ref 0–44)
AST: 14 U/L — ABNORMAL LOW (ref 15–41)
Albumin: 3.7 g/dL (ref 3.5–5.0)
Alkaline Phosphatase: 50 U/L (ref 38–126)
Anion gap: 8 (ref 5–15)
BUN: 12 mg/dL (ref 6–20)
CO2: 23 mmol/L (ref 22–32)
Calcium: 8.4 mg/dL — ABNORMAL LOW (ref 8.9–10.3)
Chloride: 109 mmol/L (ref 98–111)
Creatinine, Ser: 0.85 mg/dL (ref 0.44–1.00)
GFR, Estimated: >60 mL/min (ref >60)
Glucose, Bld: 63 mg/dL — ABNORMAL LOW (ref 70–99)
Potassium: 3.8 mmol/L (ref 3.5–5.1)
Sodium: 140 mmol/L (ref 135–145)
Total Bilirubin: 0.2 mg/dL — ABNORMAL LOW (ref 0.3–1.2)
Total Protein: 6.5 g/dL (ref 6.5–8.1)

## 2023-01-03 LAB — CBC WITH DIFFERENTIAL/PLATELET
Abs Immature Granulocytes: 0.01 10*3/uL (ref 0.00–0.07)
Basophils Absolute: 0 10*3/uL (ref 0.0–0.1)
Basophils Relative: 1 %
Eosinophils Absolute: 0.3 10*3/uL (ref 0.0–0.5)
Eosinophils Relative: 7 %
HCT: 37 % (ref 36.0–46.0)
Hemoglobin: 12.5 g/dL (ref 12.0–15.0)
Immature Granulocytes: 0 %
Lymphocytes Relative: 18 %
Lymphs Abs: 0.9 10*3/uL (ref 0.7–4.0)
MCH: 34.8 pg — ABNORMAL HIGH (ref 26.0–34.0)
MCHC: 33.8 g/dL (ref 30.0–36.0)
MCV: 103.1 fL — ABNORMAL HIGH (ref 80.0–100.0)
Monocytes Absolute: 0.3 10*3/uL (ref 0.1–1.0)
Monocytes Relative: 7 %
Neutro Abs: 3.3 10*3/uL (ref 1.7–7.7)
Neutrophils Relative %: 67 %
Platelets: 199 10*3/uL (ref 150–400)
RBC: 3.59 MIL/uL — ABNORMAL LOW (ref 3.87–5.11)
RDW: 14.3 % (ref 11.5–15.5)
WBC: 4.9 10*3/uL (ref 4.0–10.5)
nRBC: 0 % (ref 0.0–0.2)

## 2023-01-03 LAB — MAGNESIUM: Magnesium: 2 mg/dL (ref 1.7–2.4)

## 2023-01-03 MED ORDER — SODIUM CHLORIDE 0.9% FLUSH
10.0000 mL | INTRAVENOUS | Status: DC | PRN
  Administered 2023-01-03: 10 mL via INTRAVENOUS

## 2023-01-03 MED ORDER — HEPARIN SOD (PORK) LOCK FLUSH 100 UNIT/ML IV SOLN
500.0000 [IU] | Freq: Once | INTRAVENOUS | Status: AC
  Administered 2023-01-03: 500 [IU] via INTRAVENOUS

## 2023-01-03 NOTE — Patient Instructions (Addendum)
[place] at [facility] Discharge Instructions   You were seen and examined today by Dr. Delton Coombes.  He reviewed the results of your lab work which is mostly normal/stable. Your blood sugar is low at 63. If you begin to feel dizzy or lightheaded you should eat something.   Continue Xeloda as prescribed on days of radiation.  We will see you back in 1 week. We will recheck labs at that time.    Thank you for choosing [place] at [facility] to provide your oncology and hematology care.  To afford each patient quality time with our provider, please arrive at least 15 minutes before your scheduled appointment time.   If you have a lab appointment with the [facility] please come in thru the Main Entrance and check in at the main information desk.  You need to re-schedule your appointment should you arrive 10 or more minutes late.  We strive to give you quality time with our providers, and arriving late affects you and other patients whose appointments are after yours.  Also, if you no show three or more times for appointments you may be dismissed from the clinic at the providers discretion.     Again, thank you for choosing [facility].  Our hope is that these requests will decrease the amount of time that you wait before being seen by our physicians.       _____________________________________________________________  Should you have questions after your visit to [facility], please contact our office at [phone] and follow the prompts.  Our office hours are 8:00 a.m. and 4:30 p.m. Monday - Friday.  Please note that voicemails left after 4:00 p.m. may not be returned until the following business day.  We are closed weekends and major holidays.  You do have access to a nurse 24-7, just call the main number to the clinic [phone] and do not press any options, hold on the line and a nurse will answer the phone.     For prescription refill requests, have your pharmacy contact our office and allow 72 hours.    Due to Covid, you will need to wear a mask upon entering the hospital. If you do not have a mask, a mask will be given to you at the Main Entrance upon arrival. For doctor visits, patients may have 1 support person age 13 or older with them. For treatment visits, patients can not have anyone with them due to social distancing guidelines and our immunocompromised population.

## 2023-01-03 NOTE — Progress Notes (Signed)
Terri Mckinney presented for Portacath access and flush and labs. No blood return noted labs were drawn peripheral from right [facility]. Portacath located right chest wall accessed with  H 20 needle.  No blood return and Portacath flushed with 15m NS and 500U/535mHeparin and needle removed intact. No bruising or swelling noted at the site  Procedure tolerated well and without incident.     Discharged from clinic ambulatory in stable condition. Alert and oriented x 3. [facility].
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
## 2023-01-05 ENCOUNTER — Ambulatory Visit (HOSPITAL_COMMUNITY)
Admission: RE | Admit: 2023-01-05 | Discharge: 2023-01-05 | Disposition: A | Discharge status: Home / Self Care | Payer: BC Managed Care – PPO | Source: Ambulatory Visit | Attending: Nurse Practitioner | Admitting: Nurse Practitioner

## 2023-01-05 DIAGNOSIS — Z433 Encounter for attention to colostomy: Secondary | ICD-10-CM | POA: Diagnosis present

## 2023-01-05 DIAGNOSIS — K9400 Colostomy complication, unspecified: Secondary | ICD-10-CM | POA: Diagnosis not present

## 2023-01-05 DIAGNOSIS — R609 Edema, unspecified: Secondary | ICD-10-CM | POA: Insufficient documentation

## 2023-01-05 DIAGNOSIS — W2219XA Striking against or struck by other automobile airbag, initial encounter: Secondary | ICD-10-CM | POA: Diagnosis not present

## 2023-01-05 NOTE — Discharge Instructions (Signed)
Add powder to stomal injury Add ostomy belt Avoid heavy lifting, risk of hernia
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
## 2023-01-10 ENCOUNTER — Inpatient Hospital Stay: Payer: BC Managed Care – PPO | Attending: Hematology | Admitting: Hematology

## 2023-01-10 ENCOUNTER — Inpatient Hospital Stay: Payer: BC Managed Care – PPO

## 2023-01-10 ENCOUNTER — Inpatient Hospital Stay: Payer: BC Managed Care – PPO | Admitting: Hematology

## 2023-01-10 VITALS — BP 118/76 | HR 102 | Temp 99.0°F | Resp 20 | Ht 68.0 in | Wt 175.8 lb

## 2023-01-10 DIAGNOSIS — R197 Diarrhea, unspecified: Secondary | ICD-10-CM | POA: Insufficient documentation

## 2023-01-10 DIAGNOSIS — F17210 Nicotine dependence, cigarettes, uncomplicated: Secondary | ICD-10-CM | POA: Insufficient documentation

## 2023-01-10 DIAGNOSIS — Z803 Family history of malignant neoplasm of breast: Secondary | ICD-10-CM | POA: Insufficient documentation

## 2023-01-10 DIAGNOSIS — C787 Secondary malignant neoplasm of liver and intrahepatic bile duct: Secondary | ICD-10-CM

## 2023-01-10 DIAGNOSIS — C20 Malignant neoplasm of rectum: Secondary | ICD-10-CM

## 2023-01-10 DIAGNOSIS — Z800 Family history of malignant neoplasm of digestive organs: Secondary | ICD-10-CM | POA: Diagnosis not present

## 2023-01-10 DIAGNOSIS — Z95828 Presence of other vascular implants and grafts: Secondary | ICD-10-CM

## 2023-01-10 DIAGNOSIS — G629 Polyneuropathy, unspecified: Secondary | ICD-10-CM | POA: Insufficient documentation

## 2023-01-10 DIAGNOSIS — Z806 Family history of leukemia: Secondary | ICD-10-CM | POA: Insufficient documentation

## 2023-01-10 LAB — CBC WITH DIFFERENTIAL/PLATELET
Abs Immature Granulocytes: 0.01 10*3/uL (ref 0.00–0.07)
Basophils Absolute: 0 10*3/uL (ref 0.0–0.1)
Basophils Relative: 0 %
Eosinophils Absolute: 0.3 10*3/uL (ref 0.0–0.5)
Eosinophils Relative: 5 %
HCT: 37 % (ref 36.0–46.0)
Hemoglobin: 12.7 g/dL (ref 12.0–15.0)
Immature Granulocytes: 0 %
Lymphocytes Relative: 16 %
Lymphs Abs: 0.8 10*3/uL (ref 0.7–4.0)
MCH: 35.5 pg — ABNORMAL HIGH (ref 26.0–34.0)
MCHC: 34.3 g/dL (ref 30.0–36.0)
MCV: 103.4 fL — ABNORMAL HIGH (ref 80.0–100.0)
Monocytes Absolute: 0.4 10*3/uL (ref 0.1–1.0)
Monocytes Relative: 7 %
Neutro Abs: 3.6 10*3/uL (ref 1.7–7.7)
Neutrophils Relative %: 72 %
Platelets: 193 10*3/uL (ref 150–400)
RBC: 3.58 MIL/uL — ABNORMAL LOW (ref 3.87–5.11)
RDW: 14.9 % (ref 11.5–15.5)
WBC: 5 10*3/uL (ref 4.0–10.5)
nRBC: 0 % (ref 0.0–0.2)

## 2023-01-10 LAB — COMPREHENSIVE METABOLIC PANEL
ALT: 16 U/L (ref 0–44)
AST: 18 U/L (ref 15–41)
Albumin: 3.6 g/dL (ref 3.5–5.0)
Alkaline Phosphatase: 53 U/L (ref 38–126)
Anion gap: 8 (ref 5–15)
BUN: 11 mg/dL (ref 6–20)
CO2: 23 mmol/L (ref 22–32)
Calcium: 8.7 mg/dL — ABNORMAL LOW (ref 8.9–10.3)
Chloride: 108 mmol/L (ref 98–111)
Creatinine, Ser: 0.68 mg/dL (ref 0.44–1.00)
GFR, Estimated: >60 mL/min (ref >60)
Glucose, Bld: 121 mg/dL — ABNORMAL HIGH (ref 70–99)
Potassium: 3.7 mmol/L (ref 3.5–5.1)
Sodium: 139 mmol/L (ref 135–145)
Total Bilirubin: 0.4 mg/dL (ref 0.3–1.2)
Total Protein: 6.4 g/dL — ABNORMAL LOW (ref 6.5–8.1)

## 2023-01-10 LAB — MAGNESIUM: Magnesium: 2.1 mg/dL (ref 1.7–2.4)

## 2023-01-10 MED ORDER — ALTEPLASE 2 MG IJ SOLR
2.0000 mg | Freq: Once | INTRAMUSCULAR | Status: AC
  Administered 2023-01-10: 2 mg
  Filled 2023-01-10: qty 2

## 2023-01-10 MED ORDER — HEPARIN SOD (PORK) LOCK FLUSH 100 UNIT/ML IV SOLN
500.0000 [IU] | Freq: Once | INTRAVENOUS | Status: AC
  Administered 2023-01-10: 500 [IU] via INTRAVENOUS

## 2023-01-10 MED ORDER — SODIUM CHLORIDE 0.9% FLUSH
10.0000 mL | Freq: Once | INTRAVENOUS | Status: AC
  Administered 2023-01-10: 10 mL via INTRAVENOUS

## 2023-01-10 NOTE — Patient Instructions (Signed)
[facility]  Discharge Instructions: Thank you for choosing [facility] to provide your oncology and hematology care.  If you have a lab appointment with the [facility], please come in thru the Main Entrance and check in at the main information desk.  Wear comfortable clothing and clothing appropriate for easy access to any Portacath or PICC line.   We strive to give you quality time with your provider. You may need to reschedule your appointment if you arrive late (15 or more minutes).  Arriving late affects you and other patients whose appointments are after yours.  Also, if you miss three or more appointments without notifying the office, you may be dismissed from the clinic at the provider's discretion.      For prescription refill requests, have your pharmacy contact our office and allow 72 hours for refills to be completed.    Today you received the following Port flushed, labs drawn from arm. Return as scheduled.   To help prevent nausea and vomiting after your treatment, we encourage you to take your nausea medication as directed.  BELOW ARE SYMPTOMS THAT SHOULD BE REPORTED IMMEDIATELY: *FEVER GREATER THAN 100.4 F (38 C) OR HIGHER *CHILLS OR SWEATING *NAUSEA AND VOMITING THAT IS NOT CONTROLLED WITH YOUR NAUSEA MEDICATION *UNUSUAL SHORTNESS OF BREATH *UNUSUAL BRUISING OR BLEEDING *URINARY PROBLEMS (pain or burning when urinating, or frequent urination) *BOWEL PROBLEMS (unusual diarrhea, constipation, pain near the anus) TENDERNESS IN MOUTH AND THROAT WITH OR WITHOUT PRESENCE OF ULCERS (sore throat, sores in mouth, or a toothache) UNUSUAL RASH, SWELLING OR PAIN  UNUSUAL VAGINAL DISCHARGE OR ITCHING   Items with * indicate a potential emergency and should be followed up as soon as possible or go to the Emergency Department if any problems should occur.  Please show the CHEMOTHERAPY ALERT CARD or IMMUNOTHERAPY ALERT CARD at check-in to the  Emergency Department and triage nurse.  Should you have questions after your visit or need to cancel or reschedule your appointment, please contact [facility] [phone]  and follow the prompts.  Office hours are 8:00 a.m. to 4:30 p.m. Monday - Friday. Please note that voicemails left after 4:00 p.m. may not be returned until the following business day.  We are closed weekends and major holidays. You have access to a nurse at all times for urgent questions. Please call the main number to the clinic [phone] and follow the prompts.  For any non-urgent questions, you may also contact your provider using MyChart. We now offer e-Visits for anyone 42 and older to request care online for non-urgent symptoms. For details visit [email or web address].   Also download the MyChart app! Go to the app store, search "MyChart", open the app, select [facility], and log in with your MyChart username and password.

## 2023-01-10 NOTE — Patient Instructions (Addendum)
[place]  Discharge Instructions  You were seen and examined today by Dr. Delton Coombes.  Dr. Delton Coombes discussed your most recent lab work which revealed that everything looks good.  Dr. Delton Coombes is going to order MRI of pelvis before he decides who to refer you to. He is also going to repeat CT of your Chest and abdomen.    Follow-up as scheduled in 4 weeks with scans and labs prior.    Thank you for choosing [facility] to provide your oncology and hematology care.   To afford each patient quality time with our provider, please arrive at least 15 minutes before your scheduled appointment time. You may need to reschedule your appointment if you arrive late (10 or more minutes). Arriving late affects you and other patients whose appointments are after yours.  Also, if you miss three or more appointments without notifying the office, you may be dismissed from the clinic at the provider's discretion.    Again, thank you for choosing [facility].  Our hope is that these requests will decrease the amount of time that you wait before being seen by our physicians.   If you have a lab appointment with the [facility] please come in thru the Main Entrance and check in at the main information desk.           _____________________________________________________________  Should you have questions after your visit to [facility], please contact our office at [phone] and follow the prompts.  Our office hours are 8:00 a.m. to 4:30 p.m. Monday - Thursday and 8:00 a.m. to 2:30 p.m. Friday.  Please note that voicemails left after 4:00 p.m. may not be returned until the following business day.  We are closed weekends and all major holidays.  You do have access to a nurse 24-7, just call the main number to the clinic [phone] and do not press any options, hold on the line and a nurse will answer the phone.     For prescription refill requests, have your pharmacy contact our office and allow 72 hours.    Masks are optional in the cancer centers. If you would like for your care team to wear a mask while they are taking care of you, please let them know. You may have one support person who is at least 44 years old accompany you for your appointments.

## 2023-01-10 NOTE — Progress Notes (Signed)
Port flushed with no blood return noted. No bruising or swelling at site. Alteplase placed in port at 1407. Blood return noted at 1505. Bandaid applied and patient discharged in satisfactory condition. VVS stable with no signs or symptoms of distressed noted.

## 2023-01-10 NOTE — Progress Notes (Signed)
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  13 cycles of FOLFOXIRI and bevacizumab completed on 11/15/2022  NGS Results: K-ras G12 R mutation, PIK3CA exon 21 mutation.  MS-stable.  TMB-low.  CURRENT THERAPY: Xeloda with radiation therapy  BRIEF ONCOLOGIC HISTORY:  Oncology History  Rectal cancer ([place])  03/28/2022 Initial Diagnosis   Rectal cancer ([place])   05/29/2022 - 07/26/2022 Chemotherapy   Patient is on Treatment Plan : COLORECTAL FOLFOXIRI + Bevacizumab q14d     05/29/2022 -  Chemotherapy   Patient is on Treatment Plan : COLORECTAL FOLFOXIRI + Bevacizumab q14d      Genetic Testing   No pathogenic variants identified on the [facility] CustomNext+RNA panel. The report date is 09/08/2022.  The CustomNext-Cancer+RNAinsight panel offered by [facility] includes sequencing and rearrangement analysis for the following 47 genes:  APC, ATM, AXIN2, BARD1, BMPR1A, BRCA1, BRCA2, BRIP1, CDH1, CDK4, CDKN2A, CHEK2, DICER1, EPCAM, GREM1, HOXB13, MEN1, MLH1, MSH2, MSH3, MSH6, MUTYH, NBN, NF1, NF2, NTHL1, PALB2, PMS2, POLD1, POLE, PTEN, RAD51C, RAD51D, RECQL, RET, SDHA, SDHAF2, SDHB, SDHC, SDHD, SMAD4, SMARCA4, STK11, TP53, TSC1, TSC2, and VHL.  RNA data is routinely analyzed for use in variant interpretation for all genes.     CANCER STAGING:  Cancer Staging  Rectal cancer [facility]) Staging form: Colon and Rectum, AJCC 8th Edition - Clinical stage from 03/28/2022: Stage IVA (cTX, cN1, cM1a) - Unsigned   INTERVAL HISTORY:  Terri Mckinney, a 42 y.o. female, seen for follow-up of rectal cancer and toxicity assessment. Her last day of radiation is planned for 01/18/23. She was last seen by me on 01/03/23.  Today, she states that she is doing well overall.  Her appetite level is at 100%. Her energy level is at 80%. She reports having no pain. She reports having diarrhea which comes and goes but is mild. She typically has 2 episodes of diarrhea which improves with Imodium. The previous issues with her ostomy have improved since she went to the ostomy clinic and was given a powder to apply to the site. She notes having numbness in her fingertips and toes however it does not cause her any problems with fine motor movements. She reports a sensation of heaviness occasionally in her BLE from her mid shins down to her feet.   REVIEW OF SYSTEMS:  Review of Systems  Constitutional:  Negative for chills, fatigue and fever.  HENT:   Negative for lump/mass, mouth sores, nosebleeds, sore throat and trouble swallowing.   Eyes:  Negative for eye problems.  Respiratory:  Negative for cough.   Cardiovascular:  Negative for chest pain, leg swelling and palpitations.  Gastrointestinal:  Negative for abdominal pain, constipation, diarrhea, nausea and vomiting.  Genitourinary:  Negative for bladder incontinence, difficulty urinating, dysuria, frequency, hematuria and nocturia.   Musculoskeletal:  Negative for arthralgias, back pain, flank pain, myalgias and neck pain.  Skin:  Negative for itching and rash.  Neurological:  Negative for dizziness, headaches and numbness.  Hematological:  Does not bruise/bleed easily.  Psychiatric/Behavioral:  Negative for depression, sleep disturbance and suicidal ideas. The patient is not nervous/anxious.   All other systems reviewed and are negative.   PAST MEDICAL/SURGICAL HISTORY:  Past Medical History:  Diagnosis Date   Cancer ([place])  Depression, major, single episode, mild ([place])    Port-A-Cath in place 05/24/2022   Past Surgical History:  Procedure Laterality Date   BIOPSY  03/21/2022   Procedure: BIOPSY;  Surgeon: [name], DO;  Location[facility];  Service: Endoscopy;;   COLONOSCOPY WITH PROPOFOL N/A  03/21/2022   Procedure: COLONOSCOPY WITH PROPOFOL;  Surgeon: [name], DO;  Location: [facility];  Service: Endoscopy;  Laterality: N/A;  8:30am   FLEXIBLE SIGMOIDOSCOPY N/A 05/19/2022   Procedure: FLEXIBLE SIGMOIDOSCOPY WITH TATTOO INJECTION;  Surgeon: Leighton Ruff, MD;  Location: [facility] ORS;  Service: General;  Laterality: N/A;   LAPAROSCOPIC DIVERTED COLOSTOMY N/A 05/19/2022   Procedure: LAPAROSCOPIC DIVERTED OSTOMY;  Surgeon: Leighton Ruff, MD;  Location: [facility] ORS;  Service: General;  Laterality: N/A;   PORTACATH PLACEMENT Right 05/19/2022   Procedure: INSERTION PORT-A-CATH;  Surgeon: Leighton Ruff, MD;  Location: [facility] ORS;  Service: General;  Laterality: Right;    SOCIAL HISTORY:  Social History   Socioeconomic History   Marital status: Married    Spouse name: Not on file   Number of children: Not on file   Years of education: Not on file   Highest education level: Not on file  Occupational History   Not on file  Tobacco Use   Smoking status: Every Day    Packs/day: 2.00    Years: 27.00    Total pack years: 54.00    Types: Cigarettes   Smokeless tobacco: Never  Vaping Use   Vaping Use: Never used  Substance and Sexual Activity   Alcohol use: No   Drug use: No   Sexual activity: Yes    Birth control/protection: Injection  Other Topics Concern   Not on file  Social History Narrative   Not on file   Social Determinants of Health   Financial Resource Strain: Not on file  Food Insecurity: Not on file  Transportation Needs: Not on file  Physical Activity: Not on file  Stress: Not on file  Social Connections: Not on file  Intimate Partner Violence: Not on file    FAMILY HISTORY:  Family History  Problem Relation Age of Onset   Heart disease Mother    Diabetes Mother    Hearing loss Father    Heart disease Father    Hyperlipidemia Father    Hypertension Father    Stroke Father    Arthritis Father    Diabetes Father    Learning disabilities Brother     Leukemia Maternal Uncle        d. 66s   Colon cancer Paternal Aunt        dx 108s   Diabetes Maternal Grandmother    Heart disease Maternal Grandmother    Diabetes Maternal Grandfather    Diabetes Paternal Grandmother    Diabetes Paternal Grandfather    Breast cancer Cousin 45    CURRENT MEDICATIONS:  Current Outpatient Medications  Medication Sig Dispense Refill   Bevacizumab (AVASTIN IV) Inject into the vein every 14 (fourteen) days.     Calcium-Cholecalciferol-Zinc 650-20-5.5 MG-MCG-MG CHEW Chew by mouth.     capecitabine (XELODA) 500 MG tablet Take 3 tablets (1,500 mg total) by mouth 2 (two) times daily. Take within 30 minutes after meals. Take only on the days of radiation therapy. 180 tablet 3   cetirizine (ZYRTEC) 10 MG tablet Take 10 mg by mouth daily.     fluorouracil CALGB 09628 2,400 mg/m2 in sodium chloride 0.9 % 150 mL Inject 2,400 mg/m2  into the vein over 48 hr.     FLUOROURACIL IV Inject into the vein every 14 (fourteen) days.     gabapentin (NEURONTIN) 300 MG capsule Take 1 capsule (300 mg total) by mouth 3 (three) times daily. 30 capsule 3   HYDROcodone-acetaminophen (NORCO) 5-325 MG tablet Take 1 tablet by mouth every 8 (eight) hours as needed for moderate pain. 90 tablet 0   ibuprofen (ADVIL) 200 MG tablet Take 1,000 mg by mouth every 6 (six) hours as needed for moderate pain.     IRINOTECAN HCL IV Inject into the vein every 14 (fourteen) days.     LEUCOVORIN CALCIUM IV Inject into the vein every 14 (fourteen) days.     medroxyPROGESTERone (DEPO-PROVERA) 150 MG/ML injection INJECT 1 ML (150 MG TOTAL) INTO THE MUSCLE EVERY 3 (THREE) MONTHS 1 mL 3   Multiple Vitamin (MULTIVITAMIN) tablet Take 1 tablet by mouth daily.     OXALIPLATIN IV Inject into the vein every 14 (fourteen) days.     Current Facility-Administered Medications  Medication Dose Route Frequency Provider Last Rate Last Admin   medroxyPROGESTERone (DEPO-PROVERA) injection 150 mg  150 mg Intramuscular Q90  days Evelina Dun A, FNP   150 mg at 09/21/22 1619    ALLERGIES:  Allergies  Allergen Reactions   Wellbutrin [Bupropion]     insomnia   Tramadol Rash    PHYSICAL EXAM:  Performance status (ECOG): 0 - Asymptomatic  There were no vitals filed for this visit. Wt Readings from Last 3 Encounters:  01/10/23 79.7 kg (175 lb 12.8 oz)  01/03/23 79 kg (174 lb 3.2 oz)  12/27/22 79.7 kg (175 lb 9.6 oz)   Physical Exam Vitals reviewed. Exam conducted with a chaperone present.  Constitutional:      Appearance: Normal appearance.  Cardiovascular:     Rate and Rhythm: Normal rate and regular rhythm.     Pulses: Normal pulses.     Heart sounds: Normal heart sounds.  Pulmonary:     Effort: Pulmonary effort is normal.     Breath sounds: Normal breath sounds.  Abdominal:     Palpations: Abdomen is soft. There is no hepatomegaly, splenomegaly or mass.     Tenderness: There is no abdominal tenderness.  Lymphadenopathy:     Upper Body:     Right upper body: No supraclavicular, axillary or pectoral adenopathy.     Left upper body: No supraclavicular, axillary or pectoral adenopathy.  Neurological:     General: No focal deficit present.     Mental Status: She is alert and oriented to person, place, and time.  Psychiatric:        Mood and Affect: Mood normal.        Behavior: Behavior normal.     LABORATORY DATA:  I have reviewed the labs as listed.     Latest Ref Rng & Units 01/10/2023    1:53 PM 01/03/2023   12:01 PM 12/27/2022   12:36 PM  CBC  WBC 4.0 - 10.5 K/uL 5.0  4.9  5.3   Hemoglobin 12.0 - 15.0 g/dL 12.7  12.5  12.3   Hematocrit 36.0 - 46.0 % 37.0  37.0  35.9   Platelets 150 - 400 K/uL 193  199  181       Latest Ref Rng & Units 01/10/2023    1:53 PM 01/03/2023   12:01 PM 12/27/2022   12:36 PM  CMP  Glucose 70 - 99 mg/dL 121  63  96   BUN  6 - 20 mg/dL '11  12  11   '$ Creatinine 0.44 - 1.00 mg/dL 0.68  0.85  0.71   Sodium 135 - 145 mmol/L 139  140  138   Potassium 3.5 - 5.1  mmol/L 3.7  3.8  3.8   Chloride 98 - 111 mmol/L 108  109  109   CO2 22 - 32 mmol/L '23  23  23   '$ Calcium 8.9 - 10.3 mg/dL 8.7  8.4  8.7   Total Protein 6.5 - 8.1 g/dL 6.4  6.5  6.2   Total Bilirubin 0.3 - 1.2 mg/dL 0.4  0.2  0.6   Alkaline Phos 38 - 126 U/L 53  50  40   AST 15 - 41 U/L '18  14  15   '$ ALT 0 - 44 U/L '16  12  13     '$ DIAGNOSTIC IMAGING:  I have independently reviewed the scans and discussed with the patient. No results found.   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  Stools have become bloody/mucousy lately. - She also reported pain in the tailbone region since March 2023.  She also has right-sided lower back pain. - 50 pound weight loss in the last 9 months, part of weight loss was intentional.  She cut back on eating sweets and lost taste to sweets after COVID infection. - CT [facility] with contrast on 03/08/2022: Irregular circumferential masslike wall thickening of sigmoid colon/rectum with adjacent perirectal adenopathy.  Multiple small hypodense lesions in the liver, largest 2.9 cm in the central aspect of the liver, question metastatic disease. - Colonoscopy on 03/21/2022 by Dr. Abbey Chatters: Fungating infiltrative nearly completely obstructing mass in the rectosigmoid colon, mass was circumferential measuring 4 cm in length. - Pathology: Rectal mass biopsy consistent with invasive moderately differentiated adenocarcinoma.  As there is very scant invasive tumor, MSI studies were deferred. - PET scan (03/23/2022): Hypermetabolic rectal primary long-segment with SUV 14.3.  Left posterior perirectal lymph node 7 mm with SUV 2.9.  Multiple tiny foci of hepatic hypermetabolism. - MRI of the liver (04/01/2022): Multiple small hypovascular rim-enhancing liver lesions, predominantly in the right hepatic lobe measuring up to 1.1 cm.  3.3 cm hypervascular mass in the central liver, most consistent with  FNH/hepatic adenoma. - Liver biopsy (04/20/2022): Metastatic moderately differentiated colonic adenocarcinoma with mucinous features - NGS testing shows K-ras G12 R mutation, PIK3CA exon 21 mutation.  MS-stable.  TMB-low. - Cycle 1 of FOLFOXIRI on 05/29/2022, bevacizumab added during cycle 2 -MRI pelvis (10/23/2022): Known metastatic rectal cancer with persistent rectal narrowing and signs of involvement of posterior cervical stroma and anterior peritoneal reflection.  There is evidence of residual tumor in the posterior cervix.  Persistent presacral edema not substantially changed. - MRI liver (11/10/2022): 3 benign lesions and previous small rim-enhancing metastatic lesions in the liver are no longer seen. - Chemoradiation therapy with Xeloda started on 12/11/2022.   Social/family history: - She is separated and is seen today with her daughter.  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  She is current active smoker, 1 pack/day for 27 years.  Denies drinking alcohol. - Paternal aunt had colon cancer.  Maternal cousin has breast cancer.  Maternal uncle had leukemia and maternal cousin had leukemia.   PLAN:  Metastatic rectal cancer to the liver: - She is tolerating Xeloda and radiation very well. - Last radiation therapy on 01/18/2023. - Physical examination  shows no mucositis or HFSR. - Labs today shows normal LFTs, electrolytes and CBC. - I will see her back in 4 weeks for follow-up.  Will repeat CT of the chest and abdomen with contrast and MRI of the pelvis rectal cancer protocol.  Will also repeat CEA level.  2.  Diarrhea: - She has on and off diarrhea which is helped with Imodium.  3.  Peripheral neuropathy: - Numbness in the fingertips and feet is stable.   Orders placed this encounter:  Orders Placed This Encounter  Procedures   MR PELVIS WO CONTRAST   CT Chest W Contrast   CT Abdomen W Contrast   CBC with Differential/Platelet   Comprehensive metabolic panel   CEA        I,Alexis Herring,acting as a scribe for Derek Jack, MD.,have documented all relevant documentation on the behalf of Derek Jack, MD,as directed by  Derek Jack, MD while in the presence of Derek Jack, MD.  I, Derek Jack MD, have reviewed the above documentation for accuracy and completeness, and I agree with the above.   Derek Jack, MD [place] [phone]

## 2023-01-11 ENCOUNTER — Other Ambulatory Visit: Payer: Self-pay

## 2023-01-17 ENCOUNTER — Inpatient Hospital Stay: Payer: BC Managed Care – PPO

## 2023-01-17 ENCOUNTER — Inpatient Hospital Stay: Payer: BC Managed Care – PPO | Admitting: Hematology

## 2023-01-18 ENCOUNTER — Inpatient Hospital Stay: Payer: BC Managed Care – PPO

## 2023-01-18 ENCOUNTER — Inpatient Hospital Stay: Payer: BC Managed Care – PPO | Admitting: Hematology

## 2023-01-23 ENCOUNTER — Other Ambulatory Visit (HOSPITAL_COMMUNITY): Payer: Self-pay | Admitting: Nurse Practitioner

## 2023-01-23 DIAGNOSIS — K9400 Colostomy complication, unspecified: Secondary | ICD-10-CM

## 2023-01-24 ENCOUNTER — Inpatient Hospital Stay: Payer: BC Managed Care – PPO | Admitting: Hematology

## 2023-01-24 ENCOUNTER — Inpatient Hospital Stay: Payer: BC Managed Care – PPO

## 2023-02-07 ENCOUNTER — Inpatient Hospital Stay: Payer: BC Managed Care – PPO | Admitting: Hematology

## 2023-02-09 ENCOUNTER — Encounter (HOSPITAL_COMMUNITY): Payer: Self-pay

## 2023-02-09 ENCOUNTER — Ambulatory Visit (HOSPITAL_COMMUNITY)
Admission: RE | Admit: 2023-02-09 | Discharge: 2023-02-09 | Disposition: A | Discharge status: Home / Self Care | Payer: BC Managed Care – PPO | Source: Ambulatory Visit | Attending: Hematology | Admitting: Hematology

## 2023-02-09 ENCOUNTER — Inpatient Hospital Stay: Payer: BC Managed Care – PPO | Attending: Hematology

## 2023-02-09 DIAGNOSIS — C787 Secondary malignant neoplasm of liver and intrahepatic bile duct: Secondary | ICD-10-CM | POA: Insufficient documentation

## 2023-02-09 DIAGNOSIS — F17210 Nicotine dependence, cigarettes, uncomplicated: Secondary | ICD-10-CM | POA: Insufficient documentation

## 2023-02-09 DIAGNOSIS — R197 Diarrhea, unspecified: Secondary | ICD-10-CM | POA: Insufficient documentation

## 2023-02-09 DIAGNOSIS — C20 Malignant neoplasm of rectum: Secondary | ICD-10-CM | POA: Insufficient documentation

## 2023-02-09 DIAGNOSIS — Z803 Family history of malignant neoplasm of breast: Secondary | ICD-10-CM | POA: Insufficient documentation

## 2023-02-09 DIAGNOSIS — Z800 Family history of malignant neoplasm of digestive organs: Secondary | ICD-10-CM | POA: Insufficient documentation

## 2023-02-09 DIAGNOSIS — Z806 Family history of leukemia: Secondary | ICD-10-CM | POA: Insufficient documentation

## 2023-02-09 DIAGNOSIS — G629 Polyneuropathy, unspecified: Secondary | ICD-10-CM | POA: Diagnosis not present

## 2023-02-09 LAB — CBC WITH DIFFERENTIAL/PLATELET
Abs Immature Granulocytes: 0.02 10*3/uL (ref 0.00–0.07)
Basophils Absolute: 0 10*3/uL (ref 0.0–0.1)
Basophils Relative: 1 %
Eosinophils Absolute: 0.2 10*3/uL (ref 0.0–0.5)
Eosinophils Relative: 3 %
HCT: 38.1 % (ref 36.0–46.0)
Hemoglobin: 12.9 g/dL (ref 12.0–15.0)
Immature Granulocytes: 0 %
Lymphocytes Relative: 12 %
Lymphs Abs: 0.8 10*3/uL (ref 0.7–4.0)
MCH: 35.2 pg — ABNORMAL HIGH (ref 26.0–34.0)
MCHC: 33.9 g/dL (ref 30.0–36.0)
MCV: 104.1 fL — ABNORMAL HIGH (ref 80.0–100.0)
Monocytes Absolute: 0.4 10*3/uL (ref 0.1–1.0)
Monocytes Relative: 6 %
Neutro Abs: 5 10*3/uL (ref 1.7–7.7)
Neutrophils Relative %: 78 %
Platelets: 209 10*3/uL (ref 150–400)
RBC: 3.66 MIL/uL — ABNORMAL LOW (ref 3.87–5.11)
RDW: 14.6 % (ref 11.5–15.5)
WBC: 6.5 10*3/uL (ref 4.0–10.5)
nRBC: 0 % (ref 0.0–0.2)

## 2023-02-09 LAB — COMPREHENSIVE METABOLIC PANEL
ALT: 14 U/L (ref 0–44)
AST: 14 U/L — ABNORMAL LOW (ref 15–41)
Albumin: 3.9 g/dL (ref 3.5–5.0)
Alkaline Phosphatase: 50 U/L (ref 38–126)
Anion gap: 8 (ref 5–15)
BUN: 11 mg/dL (ref 6–20)
CO2: 23 mmol/L (ref 22–32)
Calcium: 9.3 mg/dL (ref 8.9–10.3)
Chloride: 106 mmol/L (ref 98–111)
Creatinine, Ser: 0.68 mg/dL (ref 0.44–1.00)
GFR, Estimated: >60 mL/min (ref >60)
Glucose, Bld: 88 mg/dL (ref 70–99)
Potassium: 4 mmol/L (ref 3.5–5.1)
Sodium: 137 mmol/L (ref 135–145)
Total Bilirubin: 0.5 mg/dL (ref 0.3–1.2)
Total Protein: 6.7 g/dL (ref 6.5–8.1)

## 2023-02-09 MED ORDER — HEPARIN SOD (PORK) LOCK FLUSH 100 UNIT/ML IV SOLN
INTRAVENOUS | Status: AC
  Filled 2023-02-09: qty 5

## 2023-02-09 MED ORDER — IOHEXOL 300 MG/ML  SOLN
100.0000 mL | Freq: Once | INTRAMUSCULAR | Status: AC | PRN
  Administered 2023-02-09: 100 mL via INTRAVENOUS

## 2023-02-09 MED ORDER — IOHEXOL 9 MG/ML PO SOLN
ORAL | Status: AC
  Filled 2023-02-09: qty 1000

## 2023-02-09 NOTE — Progress Notes (Signed)
Terri Mckinney presented for Portacath access and flush. Portacath located right chest wall accessed with  PH 20 needle. Good blood return present. Portacath flushed with 10m NS . Left needle in for scans Procedure without incident. Patient tolerated procedure well.

## 2023-02-11 LAB — CEA: CEA: 2.7 ng/mL (ref 0.0–4.7)

## 2023-02-12 ENCOUNTER — Inpatient Hospital Stay: Payer: BC Managed Care – PPO | Admitting: Hematology

## 2023-02-14 ENCOUNTER — Inpatient Hospital Stay (HOSPITAL_BASED_OUTPATIENT_CLINIC_OR_DEPARTMENT_OTHER): Payer: BC Managed Care – PPO | Admitting: Hematology

## 2023-02-14 ENCOUNTER — Encounter: Payer: Self-pay | Admitting: Hematology

## 2023-02-14 VITALS — BP 116/75 | HR 106 | Temp 97.9°F | Resp 18 | Wt 167.5 lb

## 2023-02-14 DIAGNOSIS — C20 Malignant neoplasm of rectum: Secondary | ICD-10-CM | POA: Diagnosis not present

## 2023-02-14 DIAGNOSIS — C787 Secondary malignant neoplasm of liver and intrahepatic bile duct: Secondary | ICD-10-CM

## 2023-02-14 NOTE — Progress Notes (Signed)
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  Stools have become bloody/mucousy lately. - She also reported pain in the tailbone region since March 2023.  She also has right-sided lower back pain. - 50 pound weight loss in the last 9 months, part of weight loss was intentional.  She cut back on eating sweets and lost taste to sweets after COVID infection. - CT [facility] with contrast on 03/08/2022: Irregular circumferential masslike wall thickening of sigmoid colon/rectum with adjacent perirectal adenopathy.  Multiple small hypodense lesions in the liver, largest 2.9 cm in the central aspect of the liver, question metastatic disease. - Colonoscopy on 03/21/2022 by Dr. Abbey Chatters: Fungating infiltrative nearly completely obstructing mass in the rectosigmoid colon, mass was circumferential measuring 4 cm in length. - Pathology: Rectal mass biopsy consistent with invasive moderately differentiated adenocarcinoma.  As there is very scant invasive tumor, MSI studies were deferred. - PET scan (03/23/2022): Hypermetabolic rectal primary long-segment with SUV 14.3.  Left posterior perirectal lymph node 7 mm with SUV 2.9.  Multiple tiny foci of hepatic hypermetabolism. - MRI of the liver (04/01/2022): Multiple small hypovascular rim-enhancing liver lesions, predominantly in the right hepatic lobe measuring up to 1.1 cm.  3.3 cm hypervascular mass in the central liver, most consistent with FNH/hepatic adenoma. - Liver biopsy (04/20/2022): Metastatic moderately  differentiated colonic adenocarcinoma with mucinous features - NGS testing shows K-ras G12 R mutation, PIK3CA exon 21 mutation.  MS-stable.  TMB-low. - Cycle 1 of FOLFOXIRI on 05/29/2022, bevacizumab added during cycle 2 -MRI pelvis (10/23/2022): Known metastatic rectal cancer with persistent rectal narrowing and signs of involvement of posterior cervical stroma and anterior peritoneal reflection.  There is evidence of residual tumor in the posterior cervix.  Persistent presacral edema not substantially changed. - MRI liver (11/10/2022): 3 benign lesions and previous small rim-enhancing metastatic lesions in the liver are no longer seen. - Chemoradiation therapy with Xeloda from 12/11/2022 through 01/18/2023.   Social/family history: - She is separated and is seen today with her daughter.  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  She is current active smoker, 1 pack/day for 27 years.  Denies drinking alcohol. - Paternal aunt had colon cancer.  Maternal cousin has breast cancer.  Maternal uncle had leukemia and maternal cousin had leukemia.    Plan: Metastatic rectal cancer to the liver: - We reviewed MRI of the pelvis (02/09/2023): T4 disease in the pelvis with involvement of the cervix, anterior peritoneal reflection and with distortion of adjacent small bowel loops with diminished thickening of the rectum and with decreased T2 signal in most areas indicative of response to therapy.  Resolution of lymph nodes greater than 5 mm.  Imaging indicated of response to therapy in the posterior cervix. - CT CAP (02/09/2023): New hypodense lesion in the superior left lobe of the liver.  No other metastatic disease in the chest or abdomen. - CEA is 2.7.  LFTs are normal. - Based on the new hypodense lesion of the liver, I have recommended MRI of the abdomen with and without  contrast. - We will schedule phone follow-up after the scan.  2.  Diarrhea: - Diarrhea has mostly resolved.  Take Imodium as needed.  3.   Peripheral neuropathy: - Numbness in the fingertips and feet is stable.  Orders Placed This Encounter  Procedures   MR LIVER W WO CONTRAST    ASAP next available    Standing Status:   Future    Standing Expiration Date:   02/14/2024    Order Specific Question:   If indicated for the ordered procedure, I authorize the administration of contrast media per Radiology protocol    Answer:   Yes    Order Specific Question:   What is the patient's sedation requirement?    Answer:   No Sedation    Order Specific Question:   Does the patient have a pacemaker or implanted devices?    Answer:   No    Order Specific Question:   Release to patient    Answer:   Immediate    Order Specific Question:   Preferred imaging location?    Answer:   [facility] (table limit - 550lbs)      I,Alexis Herring,acting as a scribe for Derek Jack, MD.,have documented all relevant documentation on the behalf of Derek Jack, MD,as directed by  Derek Jack, MD while in the presence of Derek Jack, MD.   I, Derek Jack MD, have reviewed the above documentation for accuracy and completeness, and I agree with the above.   Derek Jack, MD   3/13/20246:28 PM  CHIEF COMPLAINT:   Diagnosis: metastatic rectal cancer to the liver   Cancer Staging  Rectal cancer [facility]) Staging form: Colon and Rectum, AJCC 8th Edition - Clinical stage from 03/28/2022: Stage IVA (cTX, cN1, cM1a) - Unsigned    Prior Therapy:  1. 13 cycles of FOLFOXIRI and bevacizumab completed on 11/15/2022   Current Therapy:  Xeloda with radiation therapy    HISTORY OF PRESENT ILLNESS:   Oncology History  Rectal cancer ([place])  03/28/2022 Initial Diagnosis   Rectal cancer ([place])   05/29/2022 - 07/26/2022 Chemotherapy   Patient is on Treatment Plan : COLORECTAL FOLFOXIRI + Bevacizumab q14d     05/29/2022 -  Chemotherapy   Patient is on Treatment Plan : COLORECTAL FOLFOXIRI + Bevacizumab q14d       Genetic Testing   No pathogenic variants identified on the [facility] CustomNext+RNA panel. The report date is 09/08/2022.  The CustomNext-Cancer+RNAinsight panel offered by [facility] includes sequencing and rearrangement analysis for the following 47 genes:  APC, ATM, AXIN2, BARD1, BMPR1A, BRCA1, BRCA2, BRIP1, CDH1, CDK4, CDKN2A, CHEK2, DICER1, EPCAM, GREM1, HOXB13, MEN1, MLH1, MSH2, MSH3, MSH6, MUTYH, NBN, NF1, NF2, NTHL1, PALB2, PMS2, POLD1, POLE, PTEN, RAD51C, RAD51D, RECQL, RET, SDHA, SDHAF2, SDHB, SDHC, SDHD, SMAD4, SMARCA4, STK11, TP53, TSC1, TSC2, and VHL.  RNA data is routinely analyzed for use in variant interpretation for all genes.      INTERVAL HISTORY:   Terri Mckinney is a 42 y.o. female presenting to clinic today for follow up of metastatic rectal cancer to the liver. She was last seen by me on 01/10/23.  Today, she states that she is doing well overall. Her appetite level is at 100%. Her energy level is at 100%. She denies any pains but reports skin breakdown at the rectum site from radiation.   PAST MEDICAL HISTORY:   Past Medical History: Past Medical History:  Diagnosis Date   Cancer ([place])    Depression, major, single episode, mild ([place])  Port-A-Cath in place 05/24/2022    Surgical History: Past Surgical History:  Procedure Laterality Date   BIOPSY  03/21/2022   Procedure: BIOPSY;  Surgeon: [name], DO;  Location[facility];  Service: Endoscopy;;   COLONOSCOPY WITH PROPOFOL N/A 03/21/2022   Procedure: COLONOSCOPY WITH PROPOFOL;  Surgeon: [name], DO;  Location: [facility];  Service: Endoscopy;  Laterality: N/A;  8:30am   FLEXIBLE SIGMOIDOSCOPY N/A 05/19/2022   Procedure: FLEXIBLE SIGMOIDOSCOPY WITH TATTOO INJECTION;  Surgeon: Leighton Ruff, MD;  Location: [facility] ORS;  Service: General;  Laterality: N/A;   LAPAROSCOPIC DIVERTED COLOSTOMY N/A 05/19/2022   Procedure: LAPAROSCOPIC DIVERTED OSTOMY;  Surgeon: Leighton Ruff, MD;  Location: [facility];   Service: General;  Laterality: N/A;   PORTACATH PLACEMENT Right 05/19/2022   Procedure: INSERTION PORT-A-CATH;  Surgeon: Leighton Ruff, MD;  Location: [facility];  Service: General;  Laterality: Right;    Social History: Social History   Socioeconomic History   Marital status: Married    Spouse name: Not on file   Number of children: Not on file   Years of education: Not on file   Highest education level: Not on file  Occupational History   Not on file  Tobacco Use   Smoking status: Every Day    Packs/day: 2.00    Years: 27.00    Total pack years: 54.00    Types: Cigarettes   Smokeless tobacco: Never  Vaping Use   Vaping Use: Never used  Substance and Sexual Activity   Alcohol use: No   Drug use: No   Sexual activity: Yes    Birth control/protection: Injection  Other Topics Concern   Not on file  Social History Narrative   Not on file   Social Determinants of Health   Financial Resource Strain: Not on file  Food Insecurity: Not on file  Transportation Needs: Not on file  Physical Activity: Not on file  Stress: Not on file  Social Connections: Not on file  Intimate Partner Violence: Not on file    Family History: Family History  Problem Relation Age of Onset   Heart disease Mother    Diabetes Mother    Hearing loss Father    Heart disease Father    Hyperlipidemia Father    Hypertension Father    Stroke Father    Arthritis Father    Diabetes Father    Learning disabilities Brother    Leukemia Maternal Uncle        d. 5s   Colon cancer Paternal Aunt        dx 93s   Diabetes Maternal Grandmother    Heart disease Maternal Grandmother    Diabetes Maternal Grandfather    Diabetes Paternal Grandmother    Diabetes Paternal Grandfather    Breast cancer Cousin 44    Current Medications:  Current Outpatient Medications:    Calcium-Cholecalciferol-Zinc 650-20-5.5 MG-MCG-MG CHEW, Chew by mouth., Disp: , Rfl:    medroxyPROGESTERone (DEPO-PROVERA) 150 MG/ML  injection, INJECT 1 ML (150 MG TOTAL) INTO THE MUSCLE EVERY 3 (THREE) MONTHS, Disp: 1 mL, Rfl: 3   Multiple Vitamin (MULTIVITAMIN) tablet, Take 1 tablet by mouth daily., Disp: , Rfl:   Current Facility-Administered Medications:    medroxyPROGESTERone (DEPO-PROVERA) injection 150 mg, 150 mg, Intramuscular, Q90 days, Evelina Dun A, FNP, 150 mg at 09/21/22 1619   Allergies: Allergies  Allergen Reactions   Wellbutrin [Bupropion]     insomnia   Tramadol Rash    REVIEW OF SYSTEMS:  Review of Systems  Constitutional:  Negative for chills, fatigue and fever.  HENT:   Negative for lump/mass, mouth sores, nosebleeds, sore throat and trouble swallowing.   Eyes:  Negative for eye problems.  Respiratory:  Negative for cough and shortness of breath.   Cardiovascular:  Negative for chest pain, leg swelling and palpitations.  Gastrointestinal:  Negative for abdominal pain, constipation, diarrhea, nausea and vomiting.  Genitourinary:  Negative for bladder incontinence, difficulty urinating, dysuria, frequency, hematuria and nocturia.   Musculoskeletal:  Negative for arthralgias, back pain, flank pain, myalgias and neck pain.  Skin:  Negative for itching and rash.  Neurological:  Positive for numbness. Negative for dizziness and headaches.  Hematological:  Does not bruise/bleed easily.  Psychiatric/Behavioral:  Negative for depression, sleep disturbance and suicidal ideas. The patient is not nervous/anxious.   All other systems reviewed and are negative.    VITALS:   Blood pressure 116/75, pulse (!) 106, temperature 97.9 F (36.6 C), temperature source Oral, resp. rate 18, weight 167 lb 8 oz (76 kg), SpO2 99 %.  Wt Readings from Last 3 Encounters:  02/14/23 167 lb 8 oz (76 kg)  01/10/23 175 lb 12.8 oz (79.7 kg)  01/03/23 174 lb 3.2 oz (79 kg)    Body mass index is 25.47 kg/m.  Performance status (ECOG): 0 - Asymptomatic  PHYSICAL EXAM:   Physical Exam Vitals and nursing note  reviewed. Exam conducted with a chaperone present.  Constitutional:      Appearance: Normal appearance.  Cardiovascular:     Rate and Rhythm: Normal rate and regular rhythm.     Pulses: Normal pulses.     Heart sounds: Normal heart sounds.  Pulmonary:     Effort: Pulmonary effort is normal.     Breath sounds: Normal breath sounds.  Abdominal:     Palpations: Abdomen is soft. There is no hepatomegaly, splenomegaly or mass.     Tenderness: There is no abdominal tenderness.  Musculoskeletal:     Right lower leg: No edema.     Left lower leg: No edema.  Lymphadenopathy:     Cervical: No cervical adenopathy.     Right cervical: No superficial, deep or posterior cervical adenopathy.    Left cervical: No superficial, deep or posterior cervical adenopathy.     Upper Body:     Right upper body: No supraclavicular or axillary adenopathy.     Left upper body: No supraclavicular or axillary adenopathy.  Neurological:     General: No focal deficit present.     Mental Status: She is alert and oriented to person, place, and time.  Psychiatric:        Mood and Affect: Mood normal.        Behavior: Behavior normal.     LABS:      Latest Ref Rng & Units 02/09/2023    9:57 AM 01/10/2023    1:53 PM 01/03/2023   12:01 PM  CBC  WBC 4.0 - 10.5 K/uL 6.5  5.0  4.9   Hemoglobin 12.0 - 15.0 g/dL 12.9  12.7  12.5   Hematocrit 36.0 - 46.0 % 38.1  37.0  37.0   Platelets 150 - 400 K/uL 209  193  199       Latest Ref Rng & Units 02/09/2023    9:57 AM 01/10/2023    1:53 PM 01/03/2023   12:01 PM  CMP  Glucose 70 - 99 mg/dL 88  121  63   BUN 6 - 20 mg/dL  $'11  11  12   'q$ Creatinine 0.44 - 1.00 mg/dL 0.68  0.68  0.85   Sodium 135 - 145 mmol/L 137  139  140   Potassium 3.5 - 5.1 mmol/L 4.0  3.7  3.8   Chloride 98 - 111 mmol/L 106  108  109   CO2 22 - 32 mmol/L '23  23  23   '$ Calcium 8.9 - 10.3 mg/dL 9.3  8.7  8.4   Total Protein 6.5 - 8.1 g/dL 6.7  6.4  6.5   Total Bilirubin 0.3 - 1.2 mg/dL 0.5  0.4  0.2    Alkaline Phos 38 - 126 U/L 50  53  50   AST 15 - 41 U/L '14  18  14   '$ ALT 0 - 44 U/L '14  16  12      '$ Lab Results  Component Value Date   CEA1 2.7 02/09/2023   /  CEA  Date Value Ref Range Status  02/09/2023 2.7 0.0 - 4.7 ng/mL Final    Comment:    (NOTE)                             Nonsmokers          <3.9                             Smokers             <5.6 [employer] Electrochemiluminescence Immunoassay (ECLIA) Values obtained with different assay methods or kits cannot be used interchangeably.  Results cannot be interpreted as absolute evidence of the presence or absence of malignant disease. Performed At: [facility] [place], [place] [id number] Rush Farmer MD [id number]    No results found for: "PSA1" No results found for: "[phone]" No results found for: "CAN125"  No results found for: "TOTALPROTELP", "ALBUMINELP", "A1GS", "A2GS", "BETS", "BETA2SER", "GAMS", "MSPIKE", "SPEI" No results found for: "TIBC", "FERRITIN", "IRONPCTSAT" No results found for: "LDH"   STUDIES:   MR PELVIS WO CONTRAST  Result Date: 02/12/2023 CLINICAL DATA:  Rectal cancer, assess treatment response. EXAM: MRI PELVIS WITHOUT CONTRAST TECHNIQUE: Multiplanar multisequence MR imaging of the pelvis was performed. No intravenous contrast was administered. Ultrasound gel was administered per rectum to optimize tumor evaluation. COMPARISON:  October 23, 2022 FINDINGS: TUMOR LOCATION Tumor distance from Anal Verge/Skin surface: 8.0 cm Tumor distance to Internal Anal sphincter: 5.5 cm TUMOR DESCRIPTION Circumferential extent: Circumferential narrowing of the rectum in the mid to high rectum at the level of the anterior peritoneal reflection is redemonstrated. Predominantly low T2 signal occupies the site of tumor which extends into the perirectal fat and into the posterior cervix. Diminished overall stranding in the posterior mesorectum in presacral region following  additional therapy. Tumor Size and volume: Difficult to estimate due to circumferential nature of the tumor. There is a 9 x 10 x 9 mm area of intermediate T2 signal and restricted diffusion along the posterior cervix (image 21/8) previously 14 x 11 mm in the axial plane. T - CATEGORY Extension through Muscularis Propria: Yes Shortest Distance of any tumor/node from Mesorectal fascia: 0 Extramural Vascular Invasion/Tumor Thrombus: Persistent expansion of LEFT posterolateral mesorectal vessels on image 18/8 less conspicuous than on previous imaging. Invasion of Anterior Peritoneal Reflection: Yes Involvement of Adjacent Organs or Pelvic Sidewall: Yes Levator Ani Involvement: No N - CATEGORY Mesorectal Lymph Nodes >=64m: Resolution  of lymph node seen on previous imaging in the mesorectum and along superior rectal vein to the extent evaluated. Small lymph nodes remaining less than 5 mm size. Extra-mesorectal Lymphadenopathy: None visualized on limited imaging of the pelvis performed for dedicated rectal evaluation. Other: Tethering of structures about the apex of the vagina and posterior cervix similar to previous imaging. Small bowel loops are again in close proximity to the site of tumor and with suggested distortion of small bowel loops in the area of the RIGHT vaginal apex. Thickening of the APR now showing homogeneous low T2 signal adjacent to these bowel loops. IMPRESSION: 1. T4 disease in the pelvis with involvement of the cervix, anterior peritoneal reflection and with distortion of adjacent small bowel loops as on previous imaging though with diminished thickening of the rectum and with decreased T2 signal in most areas indicative of response to therapy. 2. Persistent restricted diffusion in the posterior cervix with diminished size discrete area of abnormal signal in the posterior cervix indicative of response to therapy but remaining suspicious for residual disease. PET imaging may be helpful. 3. Diminished  conspicuity of EMVI and resolution of lymph nodes greater than 5 mm. Electronically Signed   By: Zetta Bills M.D.   On: 02/12/2023 08:33   CT Chest W Contrast  Result Date: 02/11/2023 CLINICAL DATA:  Metastatic rectal cancer restaging, liver metastases * Tracking Code: BO * EXAM: CT CHEST AND ABDOMEN WITH CONTRAST TECHNIQUE: Multidetector CT imaging of the chest and abdomen was performed following the standard protocol during bolus administration of intravenous contrast. RADIATION DOSE REDUCTION: This exam was performed according to the departmental dose-optimization program which includes automated exposure control, adjustment of the mA and/or kV according to patient size and/or use of iterative reconstruction technique. CONTRAST:  135m OMNIPAQUE IOHEXOL 300 MG/ML SOLN additional oral enteric contrast COMPARISON:  CT pelvis, 11/25/2022 CT abdomen pelvis, 08/14/2022, MR abdomen, 11/12/2022 FINDINGS: CT CHEST FINDINGS Cardiovascular: Right chest port catheter. Normal heart size. No pericardial effusion. Mediastinum/Nodes: No enlarged mediastinal, hilar, or axillary lymph nodes. Thyroid gland, trachea, and esophagus demonstrate no significant findings. Lungs/Pleura: Lungs are clear. No pleural effusion or pneumothorax. Musculoskeletal: No chest wall abnormality. No acute osseous findings. CT ABDOMEN FINDINGS Hepatobiliary: New hypodense lesion of the superior left lobe of the liver, hepatic segment II measuring 1.3 x 1.1 cm (series 2, image 49). Additional faintly hyperenhancing lesion in the central right lobe of the liver is not appreciably changed although much better demonstrated by prior multiphasic contrast enhanced MR, measuring 3.2 x 2.5 cm (series 2, image 53). This was previously characterized as a benign lesion such as a focal nodular hyperplasia and was not previously FDG avid. No gallstones, gallbladder wall thickening, or biliary dilatation. Pancreas: Unremarkable. No pancreatic ductal dilatation  or surrounding inflammatory changes. Spleen: Normal in size without significant abnormality. Adrenals/Urinary Tract: Adrenal glands are unremarkable. Kidneys are normal, without renal calculi, solid lesion, or hydronephrosis. Stomach/Bowel: Stomach is within normal limits. 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 Vascular/Lymphatic: No significant vascular findings are present. No enlarged abdominal or pelvic lymph nodes. Other: No abdominal wall hernia or abnormality. No ascites. Musculoskeletal: No acute osseous findings. IMPRESSION: 1. New hypodense lesion of the superior left lobe of the liver, consistent with a new hepatic metastasis. 2. Additional faintly hyperenhancing lesion in the central right lobe of the liver is not appreciably changed although much better demonstrated by prior multiphasic contrast enhanced MR,  measuring 3.2 x 2.5 cm. This was previously characterized as a benign lesion such as a focal nodular hyperplasia and was not previously FDG avid. 3. No other evidence of metastatic disease in the chest or abdomen. 4. 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 Electronically Signed   By: Delanna Ahmadi M.D.   On: 02/11/2023 14:43   CT Abdomen W Contrast  Result Date: 02/11/2023 CLINICAL DATA:  Metastatic rectal cancer restaging, liver metastases * Tracking Code: BO * EXAM: CT CHEST AND ABDOMEN WITH CONTRAST TECHNIQUE: Multidetector CT imaging of the chest and abdomen was performed following the standard protocol during bolus administration of intravenous contrast. RADIATION DOSE REDUCTION: This exam was performed according to the departmental dose-optimization program which includes automated exposure control, adjustment of the mA and/or kV according to patient size and/or use of iterative reconstruction technique. CONTRAST:  124m OMNIPAQUE  IOHEXOL 300 MG/ML SOLN additional oral enteric contrast COMPARISON:  CT pelvis, 11/25/2022 CT abdomen pelvis, 08/14/2022, MR abdomen, 11/12/2022 FINDINGS: CT CHEST FINDINGS Cardiovascular: Right chest port catheter. Normal heart size. No pericardial effusion. Mediastinum/Nodes: No enlarged mediastinal, hilar, or axillary lymph nodes. Thyroid gland, trachea, and esophagus demonstrate no significant findings. Lungs/Pleura: Lungs are clear. No pleural effusion or pneumothorax. Musculoskeletal: No chest wall abnormality. No acute osseous findings. CT ABDOMEN FINDINGS Hepatobiliary: New hypodense lesion of the superior left lobe of the liver, hepatic segment II measuring 1.3 x 1.1 cm (series 2, image 49). Additional faintly hyperenhancing lesion in the central right lobe of the liver is not appreciably changed although much better demonstrated by prior multiphasic contrast enhanced MR, measuring 3.2 x 2.5 cm (series 2, image 53). This was previously characterized as a benign lesion such as a focal nodular hyperplasia and was not previously FDG avid. No gallstones, gallbladder wall thickening, or biliary dilatation. Pancreas: Unremarkable. No pancreatic ductal dilatation or surrounding inflammatory changes. Spleen: Normal in size without significant abnormality. Adrenals/Urinary Tract: Adrenal glands are unremarkable. Kidneys are normal, without renal calculi, solid lesion, or hydronephrosis. Stomach/Bowel: Stomach is within normal limits. 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 Vascular/Lymphatic: No significant vascular findings are present. No enlarged abdominal or pelvic lymph nodes. Other: No abdominal wall hernia or abnormality. No ascites. Musculoskeletal: No acute osseous findings. IMPRESSION: 1. New hypodense lesion of the superior left lobe of the liver, consistent with a new hepatic metastasis. 2. Additional faintly  hyperenhancing lesion in the central right lobe of the liver is not appreciably changed although much better demonstrated by prior multiphasic contrast enhanced MR, measuring 3.2 x 2.5 cm. This was previously characterized as a benign lesion such as a focal nodular hyperplasia and was not previously FDG avid. 3. No other evidence of metastatic disease in the chest or abdomen. 4. 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 Electronically Signed   By: ADelanna AhmadiM.D.   On: 02/11/2023 14:43

## 2023-02-14 NOTE — Patient Instructions (Signed)
[facility]  Discharge Instructions  You were seen and examined today by Dr. Delton Coombes.  Dr. Delton Coombes discussed your most recent lab work and CT scan which revealed that everything looks good except for one spot on your liver.  Dr. Delton Coombes recommends doing a Liver MRI to get a more clearer picture of what this spot is.  Follow-up as scheduled.    Thank you for choosing [facility] to provide your oncology and hematology care.   To afford each patient quality time with our provider, please arrive at least 15 minutes before your scheduled appointment time. You may need to reschedule your appointment if you arrive late (10 or more minutes). Arriving late affects you and other patients whose appointments are after yours.  Also, if you miss three or more appointments without notifying the office, you may be dismissed from the clinic at the provider's discretion.    Again, thank you for choosing [facility].  Our hope is that these requests will decrease the amount of time that you wait before being seen by our physicians.   If you have a lab appointment with the [facility] please come in thru the Main Entrance and check in at the main information desk.           _____________________________________________________________  Should you have questions after your visit to [facility], please contact our office at [phone] and follow the prompts.  Our office hours are 8:00 a.m. to 4:30 p.m. Monday - Thursday and 8:00 a.m. to 2:30 p.m. Friday.  Please note that voicemails left after 4:00 p.m. may not be returned until the following business day.  We are closed weekends and all major holidays.  You do have access to a nurse 24-7, just call the main number to the clinic [phone] and do not press any options, hold on the line and a nurse will answer the phone.    For prescription refill requests, have  your pharmacy contact our office and allow 72 hours.    Masks are optional in the cancer centers. If you would like for your care team to wear a mask while they are taking care of you, please let them know. You may have one support person who is at least 44 years old accompany you for your appointments.

## 2023-02-23 ENCOUNTER — Ambulatory Visit (HOSPITAL_COMMUNITY)
Admission: RE | Admit: 2023-02-23 | Discharge: 2023-02-23 | Disposition: A | Discharge status: Home / Self Care | Payer: BC Managed Care – PPO | Source: Ambulatory Visit | Attending: Hematology | Admitting: Hematology

## 2023-02-23 DIAGNOSIS — C787 Secondary malignant neoplasm of liver and intrahepatic bile duct: Secondary | ICD-10-CM | POA: Diagnosis present

## 2023-02-23 DIAGNOSIS — C20 Malignant neoplasm of rectum: Secondary | ICD-10-CM | POA: Insufficient documentation

## 2023-02-23 MED ORDER — GADOBUTROL 1 MMOL/ML IV SOLN
7.0000 mL | Freq: Once | INTRAVENOUS | Status: AC | PRN
  Administered 2023-02-23: 7 mL via INTRAVENOUS

## 2023-02-28 ENCOUNTER — Inpatient Hospital Stay (HOSPITAL_BASED_OUTPATIENT_CLINIC_OR_DEPARTMENT_OTHER): Payer: BC Managed Care – PPO | Admitting: Hematology

## 2023-02-28 ENCOUNTER — Encounter: Payer: Self-pay | Admitting: Hematology

## 2023-02-28 DIAGNOSIS — C787 Secondary malignant neoplasm of liver and intrahepatic bile duct: Secondary | ICD-10-CM | POA: Diagnosis not present

## 2023-02-28 DIAGNOSIS — C20 Malignant neoplasm of rectum: Secondary | ICD-10-CM | POA: Diagnosis not present

## 2023-02-28 NOTE — Progress Notes (Signed)
Virtual Visit via Telephone Note  I connected with Barrett Shell on 02/28/23 at  4:00 PM EDT by telephone and verified that I am speaking with the correct person using two identifiers.  Location: Patient: home Provider: office   I discussed the limitations, risks, security and privacy concerns of performing an evaluation and management service by telephone and the availability of in person appointments. I also discussed with the patient that there may be a patient responsible charge related to this service. The patient expressed understanding and agreed to proceed.   History of Present Illness:  Terri Mckinney is a 42 y.o. female presenting to clinic today for follow up of metastatic rectal cancer to the liver. She was last seen by me on 02/14/23.   Observations/Objective:  Today, she states that she is doing well overall. Her appetite level is at 100%. Her energy level is at 75%. We discussed her liver MRI 02/23/23 results today.  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  Stools have become bloody/mucousy lately. - She also reported pain in the tailbone region since March 2023.  She also has right-sided lower back pain. - 50 pound weight loss in the last 9 months, part of weight loss was intentional.  She cut back on eating sweets and lost taste to sweets after COVID infection. - CT [facility] with contrast on 03/08/2022: Irregular circumferential masslike wall thickening of sigmoid colon/rectum with adjacent perirectal adenopathy.  Multiple small hypodense lesions in the liver, largest 2.9 cm in the central aspect of the liver, question metastatic disease. - Colonoscopy on 03/21/2022 by Dr. Abbey Chatters: Fungating infiltrative nearly completely obstructing mass in the rectosigmoid colon, mass was circumferential measuring 4 cm in length. - Pathology: Rectal mass biopsy consistent with invasive  moderately differentiated adenocarcinoma.  As there is very scant invasive tumor, MSI studies were deferred. - PET scan (03/23/2022): Hypermetabolic rectal primary long-segment with SUV 14.3.  Left posterior perirectal lymph node 7 mm with SUV 2.9.  Multiple tiny foci of hepatic hypermetabolism. - MRI of the liver (04/01/2022): Multiple small hypovascular rim-enhancing liver lesions, predominantly in the right hepatic lobe measuring up to 1.1 cm.  3.3 cm hypervascular mass in the central liver, most consistent with FNH/hepatic adenoma. - Liver biopsy (04/20/2022): Metastatic moderately differentiated colonic adenocarcinoma with mucinous features - NGS testing shows K-ras G12 R mutation, PIK3CA exon 21 mutation.  MS-stable.  TMB-low. - Cycle 1 of FOLFOXIRI on 05/29/2022, bevacizumab added during cycle 2 -MRI pelvis (10/23/2022): Known metastatic rectal cancer with persistent rectal narrowing and signs of involvement of posterior cervical stroma and anterior peritoneal reflection.  There is evidence of residual tumor in the posterior cervix.  Persistent presacral edema not substantially changed. - MRI liver (11/10/2022): 3 benign lesions and previous small rim-enhancing metastatic lesions in the liver are no longer seen. - Chemoradiation therapy with Xeloda from 12/11/2022 through 01/18/2023.   Social/family history: - She is separated and is seen today with her daughter.  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  She is current active smoker, 1 pack/day for 27 years.  Denies drinking alcohol. - Paternal aunt had colon cancer.  Maternal cousin has breast cancer.  Maternal uncle had leukemia and maternal cousin had leukemia.     Plan: Metastatic rectal cancer to the liver: - MRI of the pelvis on 02/09/2023 showed partial response. - CEA was 2.7. - We reviewed MRI of the  liver from 02/23/2023 which confirmed superior left lobe of liver segment 2 lesion measuring 1.5 x 1.3 cm.  No other new areas in the liver  seen. - I will reach out to interventional radiology to see if local treatments (embolization/ablation) are options. - Patient wants to have her rectal primary removed.  I will reach out to Dr. Marcello Moores about her surgery. - I will see her back in 3 months for follow-up.  2.  Diarrhea: - Take Imodium as needed.  Diarrhea has resolved for the most.  3.  Peripheral neuropathy: - Numbness in the fingertips and feet is stable.  Follow Up Instructions:  RTC 3 months. I discussed the assessment and treatment plan with the patient. The patient was provided an opportunity to ask questions and all were answered. The patient agreed with the plan and demonstrated an understanding of the instructions.   The patient was advised to call back or seek an in-person evaluation if the symptoms worsen or if the condition fails to improve as anticipated.  I provided 24 minutes of non-face-to-face time during this encounter.   I,Alexis Herring,acting as a Education administrator for [employer], MD.,have documented all relevant documentation on the behalf of Derek Jack, MD,as directed by  Derek Jack, MD while in the presence of Derek Jack, MD.  I, Derek Jack MD, have reviewed the above documentation for accuracy and completeness, and I agree with the above.

## 2023-03-05 ENCOUNTER — Ambulatory Visit: Payer: Self-pay | Admitting: General Surgery

## 2023-03-05 DIAGNOSIS — Z720 Tobacco use: Secondary | ICD-10-CM

## 2023-03-05 NOTE — H&P (View-Only) (Signed)
 PROVIDER:  Gibson Lad [name], MD  MRN: [id number] DOB: 11/27/1981 DATE OF ENCOUNTER: 03/05/2023 Interval History:     42-year-old female who presented to the office with Mckinney newly diagnosed rectal adenocarcinoma in June 23.  She had been having bleeding and LLQ pain for the past few months.  She underwent Mckinney CT scan of the abdomen pelvis on April 5.  This showed Mckinney rectal mass with perirectal adenopathy concerning for malignancy.  She underwent Mckinney colonoscopy on 4/18 and was noted to have Mckinney rectal mass on digital rectal exam.  Biopsies showed adenocarcinoma moderately differentiated.  This was noted to be obstructive and could only be bypassed with Mckinney pediatric scope.  The colonoscopy was not completed.  She then underwent Mckinney PET scan which showed multiple hepatic bilobar hypermetabolic lesions in her liver.  She also underwent an abdominal MRI which again demonstrated the liver lesions highly suspicious for metastases.  She smokes 1 pack Mckinney day.  No other major medical history noted. She does have Mckinney family history of colon cancer but genetics testing has been negative.  She underwent diverting sigmoid ostomy and port placement in July 23.  She completed several rounds of chemotherapy at [place] and f/u imaging showed stable to decreasing size of liver lesions as well as decreasing size of rectal tumor and rectal adenopathy.  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  Restaging CT scan shows 1 new area of metastatic disease in the left lobe of the liver which was confirmed on liver MRI.  She is here today for follow-up.   Past Medical History:  Diagnosis Date   Cancer (HCC)    Depression, major, single episode, mild (HCC)    Port-Mckinney-Cath in place 05/24/2022   Past Surgical History:  Procedure Laterality Date   BIOPSY  03/21/2022   Procedure: BIOPSY;  Surgeon: Carver, Charles K, DO;  [facility];  Service: Endoscopy;;   COLONOSCOPY WITH PROPOFOL N/Mckinney 03/21/2022   Procedure:  COLONOSCOPY WITH PROPOFOL;  Surgeon: Carver, Charles K, DO;  [facility];  Service: Endoscopy;  Laterality: N/Mckinney;  8:30am   FLEXIBLE SIGMOIDOSCOPY N/Mckinney 05/19/2022   Procedure: FLEXIBLE SIGMOIDOSCOPY WITH TATTOO INJECTION;  Surgeon: Eliot Popper, MD;  Location: [facility];  Service: General;  Laterality: N/Mckinney;   LAPAROSCOPIC DIVERTED COLOSTOMY N/Mckinney 05/19/2022   Procedure: LAPAROSCOPIC DIVERTED OSTOMY;  Surgeon: Zyaire Dumas, MD;  Location: [facility];  Service: General;  Laterality: N/Mckinney;   PORTACATH PLACEMENT Right 05/19/2022   Procedure: INSERTION PORT-Mckinney-CATH;  Surgeon: Annelie Boak, MD;  Location: [facility];  Service: General;  Laterality: Right;   Family History  Problem Relation Age of Onset   Heart disease Mother    Diabetes Mother    Hearing loss Father    Heart disease Father    Hyperlipidemia Father    Hypertension Father    Stroke Father    Arthritis Father    Diabetes Father    Learning disabilities Brother    Leukemia Maternal Uncle        d. 50s   Colon cancer Paternal Aunt        dx 60s   Diabetes Maternal Grandmother    Heart disease Maternal Grandmother    Diabetes Maternal Grandfather    Diabetes Paternal Grandmother    Diabetes Paternal Grandfather    Breast cancer Cousin 33   Social History   Socioeconomic History   Marital status: Married    Spouse name: Not on file     Number of children: Not on file   Years of education: Not on file   Highest education level: Not on file  Occupational History   Not on file  Tobacco Use   Smoking status: Every Day    Packs/day: 2.00    Years: 27.00    Additional pack years: 0.00    Total pack years: 54.00    Types: Cigarettes   Smokeless tobacco: Never  Vaping Use   Vaping Use: Never used  Substance and Sexual Activity   Alcohol use: No   Drug use: No   Sexual activity: Yes    Birth control/protection: Injection  Other Topics Concern   Not on file  Social History Narrative   Not on file   Social  Determinants of Health   Financial Resource Strain: Not on file  Food Insecurity: Not on file  Transportation Needs: Not on file  Physical Activity: Not on file  Stress: Not on file  Social Connections: Not on file  Intimate Partner Violence: Not on file    Current Outpatient Medications:    Calcium-Cholecalciferol-Zinc 650-20-5.5 MG-MCG-MG CHEW, Chew by mouth., Disp: , Rfl:    medroxyPROGESTERone (DEPO-PROVERA) 150 MG/ML injection, INJECT 1 ML (150 MG TOTAL) INTO THE MUSCLE EVERY 3 (THREE) MONTHS, Disp: 1 mL, Rfl: 3   Multiple Vitamin (MULTIVITAMIN) tablet, Take 1 tablet by mouth daily., Disp: , Rfl:   Current Facility-Administered Medications:    medroxyPROGESTERone (DEPO-PROVERA) injection 150 mg, 150 mg, Intramuscular, Q90 days, Hawks, Terri Mckinney, Terri Mckinney, 150 mg at 09/21/22 1619 Allergies  Allergen Reactions   Wellbutrin [Bupropion]     insomnia   Tramadol Rash   Review of Systems - Negative except as state above   Physical Examination:   Gen: NAD CV: RRR Lungs: CTA Abd: soft, ostomy intact Incision: healing well  Assessment and Plan:   Terri Mckinney is Mckinney 42 y.o. female with metastatic rectal cancer, who underwent diverting ostomy placement and port.  She has started chemotherapy.  We discussed today that surgical resection of rectal tumor would be mainly determined on the stability of her liver disease.  If it is felt that her liver disease will not progress in the near future, I think it would be reasonable to proceed with low anterior resection and diverting ileostomy versus colostomy depending on her smoking status.  If this is not the case, I would recommend continuing chemotherapy until her liver disease is more stable.  There is no reason to do Mckinney large pelvic surgery if her metastatic disease is going to progress.  If we do plan on doing Mckinney low anterior resection I would recommend proceeding with radiation prior to surgery.  Patient also has some findings of possible  cervical tumor vs rectal invasion.  I think it would be reasonable to perform Mckinney hysterectomy at the same time.  We did discuss that this could increase her chance for rectovaginal fistula though.   The surgery and anatomy were described to the patient as well as the risks of surgery and the possible complications.  These include: Bleeding, deep abdominal infections and possible wound complications such as hernia and infection, damage to adjacent structures, leak of surgical connections, which can lead to other surgeries and possibly an ostomy, possible need for other procedures, such as abscess drains in radiology, possible prolonged hospital stay, possible diarrhea from removal of part of the colon, possible constipation from narcotics, possible bowel, bladder or sexual dysfunction if having rectal surgery, prolonged fatigue/weakness or appetite loss,   possible early recurrence of of disease, possible complications of their medical problems such as heart disease or arrhythmias or lung problems, death (less than 1%). I believe the patient understands and wishes to proceed with the surgery.   There are no diagnoses linked to this encounter.      No follow-ups on file.   The plan was discussed in detail with the patient today, who expressed understanding.  The patient has my contact information, and understands to call me with any additional questions or concerns in the interval.  I would be happy to see the patient back sooner if the need arises.   Jaedin Regina C Karlton Maya, MD Colon and Rectal Surgery [facility]  

## 2023-03-05 NOTE — H&P (Signed)
PROVIDER:  Monico Blitz, MD  MRN: [id number] DOB: [birth year] DATE OF ENCOUNTER: 03/05/2023 Interval History:     42 year old female who presented to the office with a newly diagnosed rectal adenocarcinoma in June 23.  She had been having bleeding and LLQ pain for the past few months.  She underwent a CT scan of the abdomen pelvis on April 5.  This showed a rectal mass with perirectal adenopathy concerning for malignancy.  She underwent a colonoscopy on 4/18 and was noted to have a rectal mass on digital rectal exam.  Biopsies showed adenocarcinoma moderately differentiated.  This was noted to be obstructive and could only be bypassed with a pediatric scope.  The colonoscopy was not completed.  She then underwent a PET scan which showed multiple hepatic bilobar hypermetabolic lesions in her liver.  She also underwent an abdominal MRI which again demonstrated the liver lesions highly suspicious for metastases.  She smokes 1 pack a day.  No other major medical history noted. She does have a family history of colon cancer but genetics testing has been negative.  She underwent diverting sigmoid ostomy and port placement in July 23.  She completed several rounds of chemotherapy at [facility] and f/u imaging showed stable to decreasing size of liver lesions as well as decreasing size of rectal tumor and rectal adenopathy.  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  Restaging CT scan shows 1 new area of metastatic disease in the left lobe of the liver which was confirmed on liver MRI.  She is here today for follow-up.   Past Medical History:  Diagnosis Date   Cancer [facility])    Depression, major, single episode, mild ([place])    Port-A-Cath in place 05/24/2022   Past Surgical History:  Procedure Laterality Date   BIOPSY  03/21/2022   Procedure: BIOPSY;  Surgeon: [name], DO;  [facility];  Service: Endoscopy;;   COLONOSCOPY WITH PROPOFOL N/A 03/21/2022   Procedure:  COLONOSCOPY WITH PROPOFOL;  Surgeon: [name], DO;  [facility];  Service: Endoscopy;  Laterality: N/A;  8:30am   FLEXIBLE SIGMOIDOSCOPY N/A 05/19/2022   Procedure: FLEXIBLE SIGMOIDOSCOPY WITH TATTOO INJECTION;  Surgeon: Leighton Ruff, MD;  [facility];  Service: General;  Laterality: N/A;   LAPAROSCOPIC DIVERTED COLOSTOMY N/A 05/19/2022   Procedure: LAPAROSCOPIC DIVERTED OSTOMY;  Surgeon: Leighton Ruff, MD;  [facility];  Service: General;  Laterality: N/A;   PORTACATH PLACEMENT Right 05/19/2022   Procedure: INSERTION PORT-A-CATH;  Surgeon: Leighton Ruff, MD;  [facility];  Service: General;  Laterality: Right;   Family History  Problem Relation Age of Onset   Heart disease Mother    Diabetes Mother    Hearing loss Father    Heart disease Father    Hyperlipidemia Father    Hypertension Father    Stroke Father    Arthritis Father    Diabetes Father    Learning disabilities Brother    Leukemia Maternal Uncle        d. 49s   Colon cancer Paternal Aunt        dx 24s   Diabetes Maternal Grandmother    Heart disease Maternal Grandmother    Diabetes Maternal Grandfather    Diabetes Paternal Grandmother    Diabetes Paternal Grandfather    Breast cancer Cousin 33   Social History   Socioeconomic History   Marital status: Married    Spouse name: Not on file  Number of children: Not on file   Years of education: Not on file   Highest education level: Not on file  Occupational History   Not on file  Tobacco Use   Smoking status: Every Day    Packs/day: 2.00    Years: 27.00    Additional pack years: 0.00    Total pack years: 54.00    Types: Cigarettes   Smokeless tobacco: Never  Vaping Use   Vaping Use: Never used  Substance and Sexual Activity   Alcohol use: No   Drug use: No   Sexual activity: Yes    Birth control/protection: Injection  Other Topics Concern   Not on file  Social History Narrative   Not on file   Social  Determinants of Health   Financial Resource Strain: Not on file  Food Insecurity: Not on file  Transportation Needs: Not on file  Physical Activity: Not on file  Stress: Not on file  Social Connections: Not on file  Intimate Partner Violence: Not on file    Current Outpatient Medications:    Calcium-Cholecalciferol-Zinc 650-20-5.5 MG-MCG-MG CHEW, Chew by mouth., Disp: , Rfl:    medroxyPROGESTERone (DEPO-PROVERA) 150 MG/ML injection, INJECT 1 ML (150 MG TOTAL) INTO THE MUSCLE EVERY 3 (THREE) MONTHS, Disp: 1 mL, Rfl: 3   Multiple Vitamin (MULTIVITAMIN) tablet, Take 1 tablet by mouth daily., Disp: , Rfl:   Current Facility-Administered Medications:    medroxyPROGESTERone (DEPO-PROVERA) injection 150 mg, 150 mg, Intramuscular, Q90 days, Hawks, Christy A, FNP, 150 mg at 09/21/22 1619 Allergies  Allergen Reactions   Wellbutrin [Bupropion]     insomnia   Tramadol Rash   Review of Systems - Negative except as state above   Physical Examination:   Gen: NAD CV: RRR Lungs: CTA Abd: soft, ostomy intact Incision: healing well  Assessment and Plan:   Terri Mckinney is a 42 y.o. female with metastatic rectal cancer, who underwent diverting ostomy placement and port.  She has started chemotherapy.  We discussed today that surgical resection of rectal tumor would be mainly determined on the stability of her liver disease.  If it is felt that her liver disease will not progress in the near future, I think it would be reasonable to proceed with low anterior resection and diverting ileostomy versus colostomy depending on her smoking status.  If this is not the case, I would recommend continuing chemotherapy until her liver disease is more stable.  There is no reason to do a large pelvic surgery if her metastatic disease is going to progress.  If we do plan on doing a low anterior resection I would recommend proceeding with radiation prior to surgery.  Patient also has some findings of possible  cervical tumor vs rectal invasion.  I think it would be reasonable to perform a hysterectomy at the same time.  We did discuss that this could increase her chance for rectovaginal fistula though.   The surgery and anatomy were described to the patient as well as the risks of surgery and the possible complications.  These include: Bleeding, deep abdominal infections and possible wound complications such as hernia and infection, damage to adjacent structures, leak of surgical connections, which can lead to other surgeries and possibly an ostomy, possible need for other procedures, such as abscess drains in radiology, possible prolonged hospital stay, possible diarrhea from removal of part of the colon, possible constipation from narcotics, possible bowel, bladder or sexual dysfunction if having rectal surgery, prolonged fatigue/weakness or appetite loss,  possible early recurrence of of disease, possible complications of their medical problems such as heart disease or arrhythmias or lung problems, death (less than 1%). I believe the patient understands and wishes to proceed with the surgery.   There are no diagnoses linked to this encounter.      No follow-ups on file.   The plan was discussed in detail with the patient today, who expressed understanding.  The patient has my contact information, and understands to call me with any additional questions or concerns in the interval.  I would be happy to see the patient back sooner if the need arises.   Rosario Adie, MD Colon and Rectal Surgery [facility]

## 2023-03-06 ENCOUNTER — Encounter: Payer: Self-pay | Admitting: Gynecologic Oncology

## 2023-03-06 ENCOUNTER — Telehealth: Payer: Self-pay

## 2023-03-06 ENCOUNTER — Other Ambulatory Visit: Payer: Self-pay

## 2023-03-06 NOTE — Telephone Encounter (Signed)
Spoke with Terri Mckinney regarding her referral to GYN oncology. She has an appointment scheduled with Dr. Berline Lopes on 03/09/23 at 10:30. Patient agrees to date and time. She has been provided with office address and location. She is also aware of our mask and visitor policy. Patient verbalized understanding and will call with any questions.

## 2023-03-06 NOTE — Telephone Encounter (Signed)
LVM for Terri Mckinney to call office regarding a new patient appointment with Dr.Tucker. There is an opening on Friday 4/5

## 2023-03-07 ENCOUNTER — Telehealth: Payer: Self-pay | Admitting: *Deleted

## 2023-03-07 NOTE — Telephone Encounter (Signed)
error 

## 2023-03-08 NOTE — H&P (View-Only) (Signed)
GYNECOLOGIC ONCOLOGY NEW PATIENT CONSULTATION   Patient Name: Terri Mckinney  Patient Age: 42 y.o. Date of Service: 03/09/23 Referring Provider: Alicia Thomas, MD  Primary Care Provider: Hawks, Christy A, FNP Consulting Provider: Nyree Yonker, MD   Assessment/Plan:  Premenopausal with history of rectal adenocarcinoma s/p neoadjuvant chemotherapy followed by chemoRT with plans to undergoing surgical resection of her primary tumor.  We discussed imaging findings from recent MRI. We looked at these images together. I agree that imaging supports that most likely the posterior cervix and rectovaginal space in involved by local extension of her rectal tumor. Resection of the primary tumor without resection of her cervix and upper vagina will be challenging. I agree that she would benefit from concurrent hysterectomy although this will increase the morbidity of her surgery.  It appears that her last pap in our system was in 2021. I repeated a pap and HPV testing today. She has evidence of an ectropion on the anterior cervix. The transformation zone was somewhat prominent on the posterior lip of the cervix. Biopsy was performed today to rule out concurrent cervical pathology.   We discussed option with regard to her ovaries at the time of surgery. She has been on Depo provera for nearly two decades. I suspect that her ovaries may have received some radiation and given her age, I offered that we could leave her ovaries in situ or remove them. If removed, I recommend consideration of estrogen replacement therapy. I strongly encouraged her toward smoking cessation if we are going to start ERT after surgery. I will reach out to her medical oncologist to assure no contraindication to estrogen therapy from a cancer standpoint.   We reviewed the challenges of surgery after radiation and specifically the destruction of tissue planes that can occur. I would recommend ureteral ICG injection to help identify  the ureters given risk of retroperitoneal fibrosis.   We discussed the plan for a robotic assisted hysterectomy, bilateral salpingo-oophorectomy, possible laparotomy. The risks of surgery were discussed in detail and she understands these to include infection; wound separation; hernia; vaginal cuff separation, injury to adjacent organs such as bowel, bladder, blood vessels, ureters and nerves; bleeding which may require blood transfusion; anesthesia risk; thromboembolic events; possible death; unforeseen complications; possible need for re-exploration; medical complications such as heart attack, stroke, pleural effusion and pneumonia. Specifically, we discussed the increased risk related to wound healing and development of fistula in the setting of her recent radiation. Additionally, I stressed the impact that her tobacco use plays on her healing and have strongly urged her to quit smoking prior to the surgery.  The patient will receive DVT and antibiotic prophylaxis as indicated. She voiced a clear understanding. She had the opportunity to ask questions. Perioperative instructions were reviewed with her. Prescriptions for post-op medications were sent to her pharmacy of choice.  A copy of this note was sent to the patient's referring provider.   60 minutes of total time was spent for this patient encounter, including preparation, face-to-face counseling with the patient and coordination of care, and documentation of the encounter.  Lisette Mancebo, MD  Division of Gynecologic Oncology  Department of Obstetrics and Gynecology  [facility]  ___________________________________________  Chief Complaint: No chief complaint on file.   History of Present Illness:  Terri Mckinney is a 42 y.o. y.o. female who is seen in consultation at the request of Dr. Thomas for an evaluation of possible concurrent hysterectomy at the time of her upcoming colon cancer   surgery.  Patient  was diagnosed with metastatic rectal adenocarcinoma in April 2023.  PET scan at that time showed multiple hepatic hypermetabolic lesions concerning for metastatic disease.  He underwent biopsy of 1 of these lesions which showed metastatic moderately differentiated colonic adenocarcinoma.  She underwent diverting sigmoid ostomy and port placement in July of last year. She underwent treatment with FOLFOXIRI followed by chemoradiation with Xeloda completed on 01/18/23.  The patient has now seen Dr. Thomas with plan for surgical resection of her primary rectal tumor.  Given what appears to be posterior invasion into the cervix and possibly the upper vagina, concurrent hysterectomy has been recommended.  Patient reports doing well.  Finished radiation therapy in mid February.  She had some skin irritation after treatment which has resolved.  She denies any diarrhea but use some Imodium when she was having periods of stooling more frequently.  Notes now that bowels are moving regularly, describes stool as soft.  Denies any urinary symptoms.  Denies any vaginal or rectal bleeding.  Has continued rectal discharge.  In terms of her GYN history, the patient has been on Depo-Provera for about 17 years.  Since starting rectal cancer treatment, she had 2 weeks of very scant spotting, denies any other vaginal bleeding during treatment.  Patient continues to smoke 1 pack of cigarettes per day.  PAST MEDICAL HISTORY:  Past Medical History:  Diagnosis Date   Cancer    Depression, major, single episode, mild    Port-A-Cath in place 05/24/2022   Tobacco abuse      PAST SURGICAL HISTORY:  Past Surgical History:  Procedure Laterality Date   BIOPSY  03/21/2022   Procedure: BIOPSY;  Surgeon: Carver, Charles K, DO;  [facility]: [facility];  Service: Endoscopy;;   COLONOSCOPY WITH PROPOFOL N/A 03/21/2022   Procedure: COLONOSCOPY WITH PROPOFOL;  Surgeon: Carver, Charles K, DO;  Location: [facility];  Service:  Endoscopy;  Laterality: N/A;  8:30am   FLEXIBLE SIGMOIDOSCOPY N/A 05/19/2022   Procedure: FLEXIBLE SIGMOIDOSCOPY WITH TATTOO INJECTION;  Surgeon: Thomas, Alicia, MD;  Location: [facility];  Service: General;  Laterality: N/A;   LAPAROSCOPIC DIVERTED COLOSTOMY N/A 05/19/2022   Procedure: LAPAROSCOPIC DIVERTED OSTOMY;  Surgeon: Thomas, Alicia, MD;  Location: [facility];  Service: General;  Laterality: N/A;   PORTACATH PLACEMENT Right 05/19/2022   Procedure: INSERTION PORT-A-CATH;  Surgeon: Thomas, Alicia, MD;  Location: [facility];  Service: General;  Laterality: Right;    OB/GYN HISTORY:  OB History  Gravida Para Term Preterm AB Living  2 2       2  SAB IAB Ectopic Multiple Live Births               # Outcome Date GA Lbr Len/2nd Weight Sex Delivery Anes PTL Lv  2 Para           1 Para             No LMP recorded. Patient has had an injection.  Age at menarche: 11  Age at menopause: n/a Hx of HRT: n/a Hx of STDs: denies Last pap: 07/2020 - ASCUS, HR HPV negative (prior pap 11/202 - ASCUS, HR HPV negative), otherwise, notes that her prior Pap smears were normal History of abnormal pap smears: yes  SCREENING STUDIES:  Last mammogram: has not had  Last colonoscopy: 2023  MEDICATIONS: Outpatient Encounter Medications as of 03/09/2023  Medication Sig   Calcium-Cholecalciferol-Zinc 650-20-5.5 MG-MCG-MG CHEW Chew by mouth.   medroxyPROGESTERone (DEPO-PROVERA) 150 MG/ML injection INJECT   1 ML (150 MG TOTAL) INTO THE MUSCLE EVERY 3 (THREE) MONTHS   Multiple Vitamin (MULTIVITAMIN) tablet Take 1 tablet by mouth daily.   vitamin B-12 (CYANOCOBALAMIN) 250 MCG tablet Take 250 mcg by mouth daily.   [DISCONTINUED] prochlorperazine (COMPAZINE) 10 MG tablet Take 1 tablet (10 mg total) by mouth every 6 (six) hours as needed (Nausea or vomiting).   Facility-Administered Encounter Medications as of 03/09/2023  Medication   medroxyPROGESTERone (DEPO-PROVERA) injection 150 mg    ALLERGIES:  Allergies  Allergen  Reactions   Wellbutrin [Bupropion]     insomnia   Tramadol Rash    Mainly in chest and neck area     FAMILY HISTORY:  Family History  Problem Relation Age of Onset   Heart disease Mother    Diabetes Mother    Hearing loss Father    Heart disease Father    Hyperlipidemia Father    Hypertension Father    Stroke Father    Arthritis Father    Diabetes Father    Learning disabilities Brother    Diabetes Maternal Grandmother    Heart disease Maternal Grandmother    Diabetes Maternal Grandfather    Diabetes Paternal Grandmother    Diabetes Paternal Grandfather    Leukemia Maternal Uncle        d. 50s   Colon cancer Paternal Aunt        dx 60s   Breast cancer Cousin 33   Lung cancer Maternal Aunt      SOCIAL HISTORY:  Social Connections: Not on file    REVIEW OF SYSTEMS:  + numbness Denies appetite changes, fevers, chills, fatigue, unexplained weight changes. Denies hearing loss, neck lumps or masses, mouth sores, ringing in ears or voice changes. Denies cough or wheezing.  Denies shortness of breath. Denies chest pain or palpitations. Denies leg swelling. Denies abdominal distention, pain, blood in stools, constipation, diarrhea, nausea, vomiting, or early satiety. Denies pain with intercourse, dysuria, frequency, hematuria or incontinence. Denies hot flashes, pelvic pain, vaginal bleeding or vaginal discharge.   Denies joint pain, back pain or muscle pain/cramps. Denies itching, rash, or wounds. Denies dizziness, headaches, or seizures. Denies swollen lymph nodes or glands, denies easy bruising or bleeding. Denies anxiety, depression, confusion, or decreased concentration.  Physical Exam:  Vital Signs for this encounter:  Blood pressure (!) 115/57, pulse 74, temperature 97.6 F (36.4 C), temperature source Oral, weight 166 lb 12.8 oz (75.7 kg), SpO2 100 %. Body mass index is 25.36 kg/m. General: Alert, oriented, no acute distress.  HEENT: Normocephalic,  atraumatic. Sclera anicteric.  Chest: Clear to auscultation bilaterally. No wheezes, rhonchi, or rales. Cardiovascular: Regular rate and rhythm, no murmurs, rubs, or gallops.  Abdomen: Normoactive bowel sounds. Soft, nondistended, nontender to palpation. No masses or hepatosplenomegaly appreciated.  Ostomy pink and viable.  No palpable fluid wave.  Extremities: Grossly normal range of motion. Warm, well perfused. No edema bilaterally.  Skin: No rashes or lesions.  Lymphatics: No cervical, supraclavicular, or inguinal adenopathy.  GU:  Normal external female genitalia. No lesions. No discharge or bleeding.             Bladder/urethra:  No lesions or masses, well supported bladder             Vagina: Mildly atrophic, no lesions noted.             Cervix: Prominent transformation zone with some mild increased vascularity along the posterior cervix.  What appears to be a 5 mm nabothian cyst   is noted on the anterior cervix.             Uterus: Small, moderately mobile, some effacement of the posterior cervix with some nodularity appreciated along the margin between the uterus and cervix posteriorly.             Adnexa: No masses appreciated.  LABORATORY AND RADIOLOGIC DATA:  Outside medical records were reviewed to synthesize the above history, along with the history and physical obtained during the visit.   Lab Results  Component Value Date   WBC 6.5 02/09/2023   HGB 12.9 02/09/2023   HCT 38.1 02/09/2023   PLT 209 02/09/2023   GLUCOSE 88 02/09/2023   CHOL 211 (H) 09/05/2022   TRIG 214 (H) 09/05/2022   HDL 58 09/05/2022   LDLCALC 110 (H) 09/05/2022   ALT 14 02/09/2023   AST 14 (L) 02/09/2023   NA 137 02/09/2023   K 4.0 02/09/2023   CL 106 02/09/2023   CREATININE 0.68 02/09/2023   BUN 11 02/09/2023   CO2 23 02/09/2023   TSH 0.648 09/05/2022   INR 1.1 04/20/2022    

## 2023-03-08 NOTE — Progress Notes (Signed)
GYNECOLOGIC ONCOLOGY NEW PATIENT CONSULTATION   Patient Name: Terri Mckinney  Patient Age: 42 y.o. Date of Service: 03/09/23 Referring Provider: Romie LeveeAlicia Thomas, MD  Primary Care Provider: Junie SpencerHawks, Christy A, FNP Consulting Provider: Eugene GarnetKatherine Adaijah Endres, MD   Assessment/Plan:  Premenopausal with history of rectal adenocarcinoma s/p neoadjuvant chemotherapy followed by chemoRT with plans to undergoing surgical resection of her primary tumor.  We discussed imaging findings from recent MRI. We looked at these images together. I agree that imaging supports that most likely the posterior cervix and rectovaginal space in involved by local extension of her rectal tumor. Resection of the primary tumor without resection of her cervix and upper vagina will be challenging. I agree that she would benefit from concurrent hysterectomy although this will increase the morbidity of her surgery.  It appears that her last pap in our system was in 2021. I repeated a pap and HPV testing today. She has evidence of an ectropion on the anterior cervix. The transformation zone was somewhat prominent on the posterior lip of the cervix. Biopsy was performed today to rule out concurrent cervical pathology.   We discussed option with regard to her ovaries at the time of surgery. She has been on Depo provera for nearly two decades. I suspect that her ovaries may have received some radiation and given her age, I offered that we could leave her ovaries in situ or remove them. If removed, I recommend consideration of estrogen replacement therapy. I strongly encouraged her toward smoking cessation if we are going to start ERT after surgery. I will reach out to her medical oncologist to assure no contraindication to estrogen therapy from a cancer standpoint.   We reviewed the challenges of surgery after radiation and specifically the destruction of tissue planes that can occur. I would recommend ureteral ICG injection to help identify  the ureters given risk of retroperitoneal fibrosis.   We discussed the plan for a robotic assisted hysterectomy, bilateral salpingo-oophorectomy, possible laparotomy. The risks of surgery were discussed in detail and she understands these to include infection; wound separation; hernia; vaginal cuff separation, injury to adjacent organs such as bowel, bladder, blood vessels, ureters and nerves; bleeding which may require blood transfusion; anesthesia risk; thromboembolic events; possible death; unforeseen complications; possible need for re-exploration; medical complications such as heart attack, stroke, pleural effusion and pneumonia. Specifically, we discussed the increased risk related to wound healing and development of fistula in the setting of her recent radiation. Additionally, I stressed the impact that her tobacco use plays on her healing and have strongly urged her to quit smoking prior to the surgery.  The patient will receive DVT and antibiotic prophylaxis as indicated. She voiced a clear understanding. She had the opportunity to ask questions. Perioperative instructions were reviewed with her. Prescriptions for post-op medications were sent to her pharmacy of choice.  A copy of this note was sent to the patient's referring provider.   60 minutes of total time was spent for this patient encounter, including preparation, face-to-face counseling with the patient and coordination of care, and documentation of the encounter.  Eugene GarnetKatherine Silvana Holecek, MD  Division of Gynecologic Oncology  Department of Obstetrics and Gynecology  [facility]  ___________________________________________  Chief Complaint: No chief complaint on file.   History of Present Illness:  Terri Mckinney is a 42 y.o. y.o. female who is seen in consultation at the request of Dr. Maisie Fushomas for an evaluation of possible concurrent hysterectomy at the time of her upcoming colon cancer  surgery.  Patient  was diagnosed with metastatic rectal adenocarcinoma in April 2023.  PET scan at that time showed multiple hepatic hypermetabolic lesions concerning for metastatic disease.  He underwent biopsy of 1 of these lesions which showed metastatic moderately differentiated colonic adenocarcinoma.  She underwent diverting sigmoid ostomy and port placement in July of last year. She underwent treatment with FOLFOXIRI followed by chemoradiation with Xeloda completed on 01/18/23.  The patient has now seen Dr. Maisie Fushomas with plan for surgical resection of her primary rectal tumor.  Given what appears to be posterior invasion into the cervix and possibly the upper vagina, concurrent hysterectomy has been recommended.  Patient reports doing well.  Finished radiation therapy in mid February.  She had some skin irritation after treatment which has resolved.  She denies any diarrhea but use some Imodium when she was having periods of stooling more frequently.  Notes now that bowels are moving regularly, describes stool as soft.  Denies any urinary symptoms.  Denies any vaginal or rectal bleeding.  Has continued rectal discharge.  In terms of her GYN history, the patient has been on Depo-Provera for about 17 years.  Since starting rectal cancer treatment, she had 2 weeks of very scant spotting, denies any other vaginal bleeding during treatment.  Patient continues to smoke 1 pack of cigarettes per day.  PAST MEDICAL HISTORY:  Past Medical History:  Diagnosis Date   Cancer    Depression, major, single episode, mild    Port-A-Cath in place 05/24/2022   Tobacco abuse      PAST SURGICAL HISTORY:  Past Surgical History:  Procedure Laterality Date   BIOPSY  03/21/2022   Procedure: BIOPSY;  Surgeon: Lanelle Balarver, Charles K, DO;  [facility]: [facility];  Service: Endoscopy;;   COLONOSCOPY WITH PROPOFOL N/A 03/21/2022   Procedure: COLONOSCOPY WITH PROPOFOL;  Surgeon: Lanelle Balarver, Charles K, DO;  [facility]: [facility];  Service:  Endoscopy;  Laterality: N/A;  8:30am   FLEXIBLE SIGMOIDOSCOPY N/A 05/19/2022   Procedure: FLEXIBLE SIGMOIDOSCOPY WITH TATTOO INJECTION;  Surgeon: Romie Leveehomas, Alicia, MD;  Location: [facility];  Service: General;  Laterality: N/A;   LAPAROSCOPIC DIVERTED COLOSTOMY N/A 05/19/2022   Procedure: LAPAROSCOPIC DIVERTED OSTOMY;  Surgeon: Romie Leveehomas, Alicia, MD;  Location: [facility];  Service: General;  Laterality: N/A;   PORTACATH PLACEMENT Right 05/19/2022   Procedure: INSERTION PORT-A-CATH;  Surgeon: Romie Leveehomas, Alicia, MD;  Location: [facility];  Service: General;  Laterality: Right;    OB/GYN HISTORY:  OB History  Gravida Para Term Preterm AB Living  2 2       2   SAB IAB Ectopic Multiple Live Births               # Outcome Date GA Lbr Len/2nd Weight Sex Delivery Anes PTL Lv  2 Para           1 Para             No LMP recorded. Patient has had an injection.  Age at menarche: [age]  Age at menopause: n/a Hx of HRT: n/a Hx of STDs: denies Last pap: 07/2020 - ASCUS, HR HPV negative (prior pap 11/202 - ASCUS, HR HPV negative), otherwise, notes that her prior Pap smears were normal History of abnormal pap smears: yes  SCREENING STUDIES:  Last mammogram: has not had  Last colonoscopy: 2023  MEDICATIONS: Outpatient Encounter Medications as of 03/09/2023  Medication Sig   Calcium-Cholecalciferol-Zinc 650-20-5.5 MG-MCG-MG CHEW Chew by mouth.   medroxyPROGESTERone (DEPO-PROVERA) 150 MG/ML injection INJECT  1 ML (150 MG TOTAL) INTO THE MUSCLE EVERY 3 (THREE) MONTHS   Multiple Vitamin (MULTIVITAMIN) tablet Take 1 tablet by mouth daily.   vitamin B-12 (CYANOCOBALAMIN) 250 MCG tablet Take 250 mcg by mouth daily.   [DISCONTINUED] prochlorperazine (COMPAZINE) 10 MG tablet Take 1 tablet (10 mg total) by mouth every 6 (six) hours as needed (Nausea or vomiting).   Facility-Administered Encounter Medications as of 03/09/2023  Medication   medroxyPROGESTERone (DEPO-PROVERA) injection 150 mg    ALLERGIES:  Allergies  Allergen  Reactions   Wellbutrin [Bupropion]     insomnia   Tramadol Rash    Mainly in chest and neck area     FAMILY HISTORY:  Family History  Problem Relation Age of Onset   Heart disease Mother    Diabetes Mother    Hearing loss Father    Heart disease Father    Hyperlipidemia Father    Hypertension Father    Stroke Father    Arthritis Father    Diabetes Father    Learning disabilities Brother    Diabetes Maternal Grandmother    Heart disease Maternal Grandmother    Diabetes Maternal Grandfather    Diabetes Paternal Grandmother    Diabetes Paternal Grandfather    Leukemia Maternal Uncle        d. 66s   Colon cancer Paternal Aunt        dx 95s   Breast cancer Cousin 33   Lung cancer Maternal Aunt      SOCIAL HISTORY:  Social Connections: Not on file    REVIEW OF SYSTEMS:  + numbness Denies appetite changes, fevers, chills, fatigue, unexplained weight changes. Denies hearing loss, neck lumps or masses, mouth sores, ringing in ears or voice changes. Denies cough or wheezing.  Denies shortness of breath. Denies chest pain or palpitations. Denies leg swelling. Denies abdominal distention, pain, blood in stools, constipation, diarrhea, nausea, vomiting, or early satiety. Denies pain with intercourse, dysuria, frequency, hematuria or incontinence. Denies hot flashes, pelvic pain, vaginal bleeding or vaginal discharge.   Denies joint pain, back pain or muscle pain/cramps. Denies itching, rash, or wounds. Denies dizziness, headaches, or seizures. Denies swollen lymph nodes or glands, denies easy bruising or bleeding. Denies anxiety, depression, confusion, or decreased concentration.  Physical Exam:  Vital Signs for this encounter:  Blood pressure (!) 115/57, pulse 74, temperature 97.6 F (36.4 C), temperature source Oral, weight 166 lb 12.8 oz (75.7 kg), SpO2 100 %. Body mass index is 25.36 kg/m. General: Alert, oriented, no acute distress.  HEENT: Normocephalic,  atraumatic. Sclera anicteric.  Chest: Clear to auscultation bilaterally. No wheezes, rhonchi, or rales. Cardiovascular: Regular rate and rhythm, no murmurs, rubs, or gallops.  Abdomen: Normoactive bowel sounds. Soft, nondistended, nontender to palpation. No masses or hepatosplenomegaly appreciated.  Ostomy pink and viable.  No palpable fluid wave.  Extremities: Grossly normal range of motion. Warm, well perfused. No edema bilaterally.  Skin: No rashes or lesions.  Lymphatics: No cervical, supraclavicular, or inguinal adenopathy.  GU:  Normal external female genitalia. No lesions. No discharge or bleeding.             Bladder/urethra:  No lesions or masses, well supported bladder             Vagina: Mildly atrophic, no lesions noted.             Cervix: Prominent transformation zone with some mild increased vascularity along the posterior cervix.  What appears to be a 5 mm nabothian cyst  is noted on the anterior cervix.             Uterus: Small, moderately mobile, some effacement of the posterior cervix with some nodularity appreciated along the margin between the uterus and cervix posteriorly.             Adnexa: No masses appreciated.  LABORATORY AND RADIOLOGIC DATA:  Outside medical records were reviewed to synthesize the above history, along with the history and physical obtained during the visit.   Lab Results  Component Value Date   WBC 6.5 02/09/2023   HGB 12.9 02/09/2023   HCT 38.1 02/09/2023   PLT 209 02/09/2023   GLUCOSE 88 02/09/2023   CHOL 211 (H) 09/05/2022   TRIG 214 (H) 09/05/2022   HDL 58 09/05/2022   LDLCALC 110 (H) 09/05/2022   ALT 14 02/09/2023   AST 14 (L) 02/09/2023   NA 137 02/09/2023   K 4.0 02/09/2023   CL 106 02/09/2023   CREATININE 0.68 02/09/2023   BUN 11 02/09/2023   CO2 23 02/09/2023   TSH 0.648 09/05/2022   INR 1.1 04/20/2022

## 2023-03-09 ENCOUNTER — Inpatient Hospital Stay (HOSPITAL_BASED_OUTPATIENT_CLINIC_OR_DEPARTMENT_OTHER): Payer: BC Managed Care – PPO | Admitting: Gynecologic Oncology

## 2023-03-09 ENCOUNTER — Inpatient Hospital Stay: Payer: BC Managed Care – PPO | Attending: Hematology | Admitting: Gynecologic Oncology

## 2023-03-09 ENCOUNTER — Other Ambulatory Visit (HOSPITAL_COMMUNITY)
Admission: RE | Admit: 2023-03-09 | Discharge: 2023-03-09 | Disposition: A | Discharge status: Home / Self Care | Payer: BC Managed Care – PPO | Source: Ambulatory Visit | Attending: Gynecologic Oncology | Admitting: Gynecologic Oncology

## 2023-03-09 ENCOUNTER — Encounter: Payer: Self-pay | Admitting: Gynecologic Oncology

## 2023-03-09 VITALS — BP 115/57 | HR 74 | Temp 97.6°F | Wt 166.8 lb

## 2023-03-09 DIAGNOSIS — C20 Malignant neoplasm of rectum: Secondary | ICD-10-CM | POA: Insufficient documentation

## 2023-03-09 DIAGNOSIS — Z9221 Personal history of antineoplastic chemotherapy: Secondary | ICD-10-CM | POA: Insufficient documentation

## 2023-03-09 DIAGNOSIS — Z923 Personal history of irradiation: Secondary | ICD-10-CM | POA: Insufficient documentation

## 2023-03-09 DIAGNOSIS — Z7189 Other specified counseling: Secondary | ICD-10-CM | POA: Diagnosis not present

## 2023-03-09 DIAGNOSIS — Z1151 Encounter for screening for human papillomavirus (HPV): Secondary | ICD-10-CM | POA: Insufficient documentation

## 2023-03-09 DIAGNOSIS — F17210 Nicotine dependence, cigarettes, uncomplicated: Secondary | ICD-10-CM | POA: Diagnosis not present

## 2023-03-09 DIAGNOSIS — Z124 Encounter for screening for malignant neoplasm of cervix: Secondary | ICD-10-CM

## 2023-03-09 DIAGNOSIS — N889 Noninflammatory disorder of cervix uteri, unspecified: Secondary | ICD-10-CM

## 2023-03-09 DIAGNOSIS — C787 Secondary malignant neoplasm of liver and intrahepatic bile duct: Secondary | ICD-10-CM | POA: Diagnosis not present

## 2023-03-09 DIAGNOSIS — Z720 Tobacco use: Secondary | ICD-10-CM

## 2023-03-09 NOTE — Patient Instructions (Signed)
Preparing for your Surgery  Plan for surgery on Apr 04, 2023 with Dr. Alicia Thomas and Dr. Katherine Tucker at [facility]. You will be scheduled for robotic assisted laparoscopic total hysterectomy (removal of the uterus and cervix), bilateral salpingo-oophorectomy (removal of both ovaries and fallopian tubes), and any other indicated procedures.   Pre-operative Testing -You will receive a phone call from presurgical testing at [facility] to arrange for a pre-operative appointment and lab work.  -Bring your insurance card, copy of an advanced directive if applicable, medication list  -At that visit, you will be asked to sign a consent for a possible blood transfusion in case a transfusion becomes necessary during surgery.  The need for a blood transfusion is rare but having consent is a necessary part of your care.     -You should not be taking blood thinners or aspirin at least ten days prior to surgery unless instructed by your surgeon.  -Do not take supplements such as fish oil (omega 3), red yeast rice, turmeric before your surgery. You want to avoid medications with aspirin in them including headache powders such as BC or Goody's), Excedrin migraine.  Day Before Surgery at Home -FOLLOW THE DIET PER DR. THOMAS.  Your role in recovery Your role is to become active as soon as directed by your doctor, while still giving yourself time to heal.  Rest when you feel tired. You will be asked to do the following in order to speed your recovery:  - Cough and breathe deeply. This helps to clear and expand your lungs and can prevent pneumonia after surgery.  - STAY ACTIVE WHEN YOU GET HOME. Do mild physical activity. Walking or moving your legs help your circulation and body functions return to normal. Do not try to get up or walk alone the first time after surgery.   -If you develop swelling on one leg or the other, pain in the back of your leg, redness/warmth in one of your legs,  please call the office or go to the Emergency Room to have a doppler to rule out a blood clot. For shortness of breath, chest pain-seek care in the Emergency Room as soon as possible. - Actively manage your pain. Managing your pain lets you move in comfort. We will ask you to rate your pain on a scale of zero to 10. It is your responsibility to tell your doctor or nurse where and how much you hurt so your pain can be treated.  Special Considerations -If you are diabetic, you may be placed on insulin after surgery to have closer control over your blood sugars to promote healing and recovery.  This does not mean that you will be discharged on insulin.  If applicable, your oral antidiabetics will be resumed when you are tolerating a solid diet.  -Your final pathology results from surgery should be available around one week after surgery and the results will be relayed to you when available.  -FMLA forms can be faxed to [phone] and please allow 5-7 business days for completion.  Risks of Surgery Risks of surgery are low but include bleeding, infection, damage to surrounding structures, re-operation, blood clots, and very rarely death.   Blood Transfusion Information (For the consent to be signed before surgery)  We will be checking your blood type before surgery so in case of emergencies, we will know what type of blood you would need.                                              WHAT IS A BLOOD TRANSFUSION?  A transfusion is the replacement of blood or some of its parts. Blood is made up of multiple cells which provide different functions. Red blood cells carry oxygen and are used for blood loss replacement. White blood cells fight against infection. Platelets control bleeding. Plasma helps clot blood. Other blood products are available for specialized needs, such as hemophilia or other clotting disorders. BEFORE THE TRANSFUSION  Who gives blood for transfusions?  You may be able to  donate blood to be used at a later date on yourself (autologous donation). Relatives can be asked to donate blood. This is generally not any safer than if you have received blood from a stranger. The same precautions are taken to ensure safety when a relative's blood is donated. Healthy volunteers who are fully evaluated to make sure their blood is safe. This is blood bank blood. Transfusion therapy is the safest it has ever been in the practice of medicine. Before blood is taken from a donor, a complete history is taken to make sure that person has no history of diseases nor engages in risky social behavior (examples are intravenous drug use or sexual activity with multiple partners). The donor's travel history is screened to minimize risk of transmitting infections, such as malaria. The donated blood is tested for signs of infectious diseases, such as HIV and hepatitis. The blood is then tested to be sure it is compatible with you in order to minimize the chance of a transfusion reaction. If you or a relative donates blood, this is often done in anticipation of surgery and is not appropriate for emergency situations. It takes many days to process the donated blood. RISKS AND COMPLICATIONS Although transfusion therapy is very safe and saves many lives, the main dangers of transfusion include:  Getting an infectious disease. Developing a transfusion reaction. This is an allergic reaction to something in the blood you were given. Every precaution is taken to prevent this. The decision to have a blood transfusion has been considered carefully by your caregiver before blood is given. Blood is not given unless the benefits outweigh the risks.  AFTER SURGERY INSTRUCTIONS  Return to work: 4-6 weeks if applicable  Activity: 1. Be up and out of the bed during the day.  Take a nap if needed.  You may walk up steps but be careful and use the hand rail.  Stair climbing will tire you more than you think, you may  need to stop part way and rest.   2. No lifting or straining for 6 weeks over 10 pounds. No pushing, pulling, straining for 6 weeks.  3. No driving for AROUND 1-2 week(s).  Do not drive if you are taking narcotic pain medicine and make sure that your reaction time has returned.   4. You can shower as soon as the next day after surgery. Shower daily.  Use your regular soap and water (not directly on the incision) and pat your incision(s) dry afterwards; don't rub.  No tub baths or submerging your body in water until cleared by your surgeon. If you have the soap that was given to you by pre-surgical testing that was used before surgery, you do not need to use it afterwards because this can irritate your incisions.   5. No sexual activity and nothing in the vagina for 10-12 weeks.  6. You may experience a small amount of clear drainage from your incisions, which is normal.  If the drainage persists, increases, or changes   color please call the office.  7. Do not use creams, lotions, or ointments such as neosporin on your incisions after surgery until advised by your surgeon because they can cause removal of the dermabond glue on your incisions.    8. You may experience vaginal spotting after surgery or around the 6-8 week mark from surgery when the stitches at the top of the vagina begin to dissolve.  The spotting is normal but if you experience heavy bleeding, call our office.  9. Take Tylenol or ibuprofen first for pain if you are able to take these medications and only use narcotic pain medication for severe pain not relieved by the Tylenol or Ibuprofen.  Monitor your Tylenol intake to a max of 4,000 mg in a 24 hour period. You can alternate these medications after surgery.  Diet: 1. Low sodium Heart Healthy Diet is recommended but you are cleared to resume your normal (before surgery) diet after your procedure.    Wound Care: 1. Keep clean and dry.  Shower daily.  Reasons to call the  Doctor: Fever - Oral temperature greater than 100.4 degrees Fahrenheit Foul-smelling vaginal discharge Difficulty urinating Nausea and vomiting Increased pain at the site of the incision that is unrelieved with pain medicine. Difficulty breathing with or without chest pain New calf pain especially if only on one side Sudden, continuing increased vaginal bleeding with or without clots.   Contacts: For questions or concerns you should contact:  Dr. Katherine Tucker at [phone]  Trayvon Trumbull, NP at [phone]  After Hours: call [phone] and have the GYN Oncologist paged/contacted (after 5 pm or on the weekends). You will speak with an after hours RN and let he or she know you have had surgery.  Messages sent via mychart are for non-urgent matters and are not responded to after hours so for urgent needs, please call the after hours number.      

## 2023-03-09 NOTE — Progress Notes (Signed)
Patient here for consultation with Dr. Eugene Garnet and for a pre-operative appointment prior to her scheduled surgery on Apr 04, 2023. She is scheduled for robotic assisted laparoscopic total hysterectomy, bilateral salpingo-oophorectomy, and any other indicated procedures. The surgery was discussed in detail.  See after visit summary for additional details. Visual aids used to discuss items related to surgery.   Discussed the use of SCDs and measures to take at home to prevent DVT including frequent mobility.  Reportable signs and symptoms of DVT discussed. Post-operative instructions discussed and expectations for after surgery. Incisional care discussed as well including reportable signs and symptoms including erythema, drainage, wound separation.     10 minutes spent preparing information and with the patient.  Verbalizing understanding of material discussed. No needs or concerns voiced at the end of the visit.   Advised patient to call for any needs. She is to follow the bowel prep per Dr. Maisie Fus.    This appointment is included in the global surgical bundle as pre-operative teaching and has no charge.

## 2023-03-09 NOTE — Patient Instructions (Signed)
Preparing for your Surgery  Plan for surgery on Apr 04, 2023 with Dr. Romie LeveeAlicia Thomas and Dr. Eugene GarnetKatherine Tucker at [facility]. You will be scheduled for robotic assisted laparoscopic total hysterectomy (removal of the uterus and cervix), bilateral salpingo-oophorectomy (removal of both ovaries and fallopian tubes), and any other indicated procedures.   Pre-operative Testing -You will receive a phone call from presurgical testing at [facility] to arrange for a pre-operative appointment and lab work.  -Bring your insurance card, copy of an advanced directive if applicable, medication list  -At that visit, you will be asked to sign a consent for a possible blood transfusion in case a transfusion becomes necessary during surgery.  The need for a blood transfusion is rare but having consent is a necessary part of your care.     -You should not be taking blood thinners or aspirin at least ten days prior to surgery unless instructed by your surgeon.  -Do not take supplements such as fish oil (omega 3), red yeast rice, turmeric before your surgery. You want to avoid medications with aspirin in them including headache powders such as BC or Goody's), Excedrin migraine.  Day Before Surgery at Home -FOLLOW THE DIET PER DR. THOMAS.  Your role in recovery Your role is to become active as soon as directed by your doctor, while still giving yourself time to heal.  Rest when you feel tired. You will be asked to do the following in order to speed your recovery:  - Cough and breathe deeply. This helps to clear and expand your lungs and can prevent pneumonia after surgery.  - STAY ACTIVE WHEN YOU GET HOME. Do mild physical activity. Walking or moving your legs help your circulation and body functions return to normal. Do not try to get up or walk alone the first time after surgery.   -If you develop swelling on one leg or the other, pain in the back of your leg, redness/warmth in one of your legs,  please call the office or go to the Emergency Room to have a doppler to rule out a blood clot. For shortness of breath, chest pain-seek care in the Emergency Room as soon as possible. - Actively manage your pain. Managing your pain lets you move in comfort. We will ask you to rate your pain on a scale of zero to 10. It is your responsibility to tell your doctor or nurse where and how much you hurt so your pain can be treated.  Special Considerations -If you are diabetic, you may be placed on insulin after surgery to have closer control over your blood sugars to promote healing and recovery.  This does not mean that you will be discharged on insulin.  If applicable, your oral antidiabetics will be resumed when you are tolerating a solid diet.  -Your final pathology results from surgery should be available around one week after surgery and the results will be relayed to you when available.  -FMLA forms can be faxed to [phone] and please allow 5-7 business days for completion.  Risks of Surgery Risks of surgery are low but include bleeding, infection, damage to surrounding structures, re-operation, blood clots, and very rarely death.   Blood Transfusion Information (For the consent to be signed before surgery)  We will be checking your blood type before surgery so in case of emergencies, we will know what type of blood you would need.  WHAT IS A BLOOD TRANSFUSION?  A transfusion is the replacement of blood or some of its parts. Blood is made up of multiple cells which provide different functions. Red blood cells carry oxygen and are used for blood loss replacement. White blood cells fight against infection. Platelets control bleeding. Plasma helps clot blood. Other blood products are available for specialized needs, such as hemophilia or other clotting disorders. BEFORE THE TRANSFUSION  Who gives blood for transfusions?  You may be able to  donate blood to be used at a later date on yourself (autologous donation). Relatives can be asked to donate blood. This is generally not any safer than if you have received blood from a stranger. The same precautions are taken to ensure safety when a relative's blood is donated. Healthy volunteers who are fully evaluated to make sure their blood is safe. This is blood bank blood. Transfusion therapy is the safest it has ever been in the practice of medicine. Before blood is taken from a donor, a complete history is taken to make sure that person has no history of diseases nor engages in risky social behavior (examples are intravenous drug use or sexual activity with multiple partners). The donor's travel history is screened to minimize risk of transmitting infections, such as malaria. The donated blood is tested for signs of infectious diseases, such as HIV and hepatitis. The blood is then tested to be sure it is compatible with you in order to minimize the chance of a transfusion reaction. If you or a relative donates blood, this is often done in anticipation of surgery and is not appropriate for emergency situations. It takes many days to process the donated blood. RISKS AND COMPLICATIONS Although transfusion therapy is very safe and saves many lives, the main dangers of transfusion include:  Getting an infectious disease. Developing a transfusion reaction. This is an allergic reaction to something in the blood you were given. Every precaution is taken to prevent this. The decision to have a blood transfusion has been considered carefully by your caregiver before blood is given. Blood is not given unless the benefits outweigh the risks.  AFTER SURGERY INSTRUCTIONS  Return to work: 4-6 weeks if applicable  Activity: 1. Be up and out of the bed during the day.  Take a nap if needed.  You may walk up steps but be careful and use the hand rail.  Stair climbing will tire you more than you think, you may  need to stop part way and rest.   2. No lifting or straining for 6 weeks over 10 pounds. No pushing, pulling, straining for 6 weeks.  3. No driving for AROUND 1-2 week(s).  Do not drive if you are taking narcotic pain medicine and make sure that your reaction time has returned.   4. You can shower as soon as the next day after surgery. Shower daily.  Use your regular soap and water (not directly on the incision) and pat your incision(s) dry afterwards; don't rub.  No tub baths or submerging your body in water until cleared by your surgeon. If you have the soap that was given to you by pre-surgical testing that was used before surgery, you do not need to use it afterwards because this can irritate your incisions.   5. No sexual activity and nothing in the vagina for 10-12 weeks.  6. You may experience a small amount of clear drainage from your incisions, which is normal.  If the drainage persists, increases, or changes  color please call the office.  7. Do not use creams, lotions, or ointments such as neosporin on your incisions after surgery until advised by your surgeon because they can cause removal of the dermabond glue on your incisions.    8. You may experience vaginal spotting after surgery or around the 6-8 week mark from surgery when the stitches at the top of the vagina begin to dissolve.  The spotting is normal but if you experience heavy bleeding, call our office.  9. Take Tylenol or ibuprofen first for pain if you are able to take these medications and only use narcotic pain medication for severe pain not relieved by the Tylenol or Ibuprofen.  Monitor your Tylenol intake to a max of 4,000 mg in a 24 hour period. You can alternate these medications after surgery.  Diet: 1. Low sodium Heart Healthy Diet is recommended but you are cleared to resume your normal (before surgery) diet after your procedure.    Wound Care: 1. Keep clean and dry.  Shower daily.  Reasons to call the  Doctor: Fever - Oral temperature greater than 100.4 degrees Fahrenheit Foul-smelling vaginal discharge Difficulty urinating Nausea and vomiting Increased pain at the site of the incision that is unrelieved with pain medicine. Difficulty breathing with or without chest pain New calf pain especially if only on one side Sudden, continuing increased vaginal bleeding with or without clots.   Contacts: For questions or concerns you should contact:  Dr. Eugene GarnetKatherine Tucker at [phone]  Warner MccreedyMelissa Bayler Gehrig, NP at [phone]  After Hours: call [phone] and have the GYN Oncologist paged/contacted (after 5 pm or on the weekends). You will speak with an after hours RN and let he or she know you have had surgery.  Messages sent via mychart are for non-urgent matters and are not responded to after hours so for urgent needs, please call the after hours number.

## 2023-03-10 IMAGING — DX DG CHEST 1V PORT
1 series · 1 of 1 positions shown · non-contrast
Comparison: None Available.

CLINICAL DATA: Right chest port placement

EXAM:
PORTABLE CHEST 1 VIEW

[chest ap]
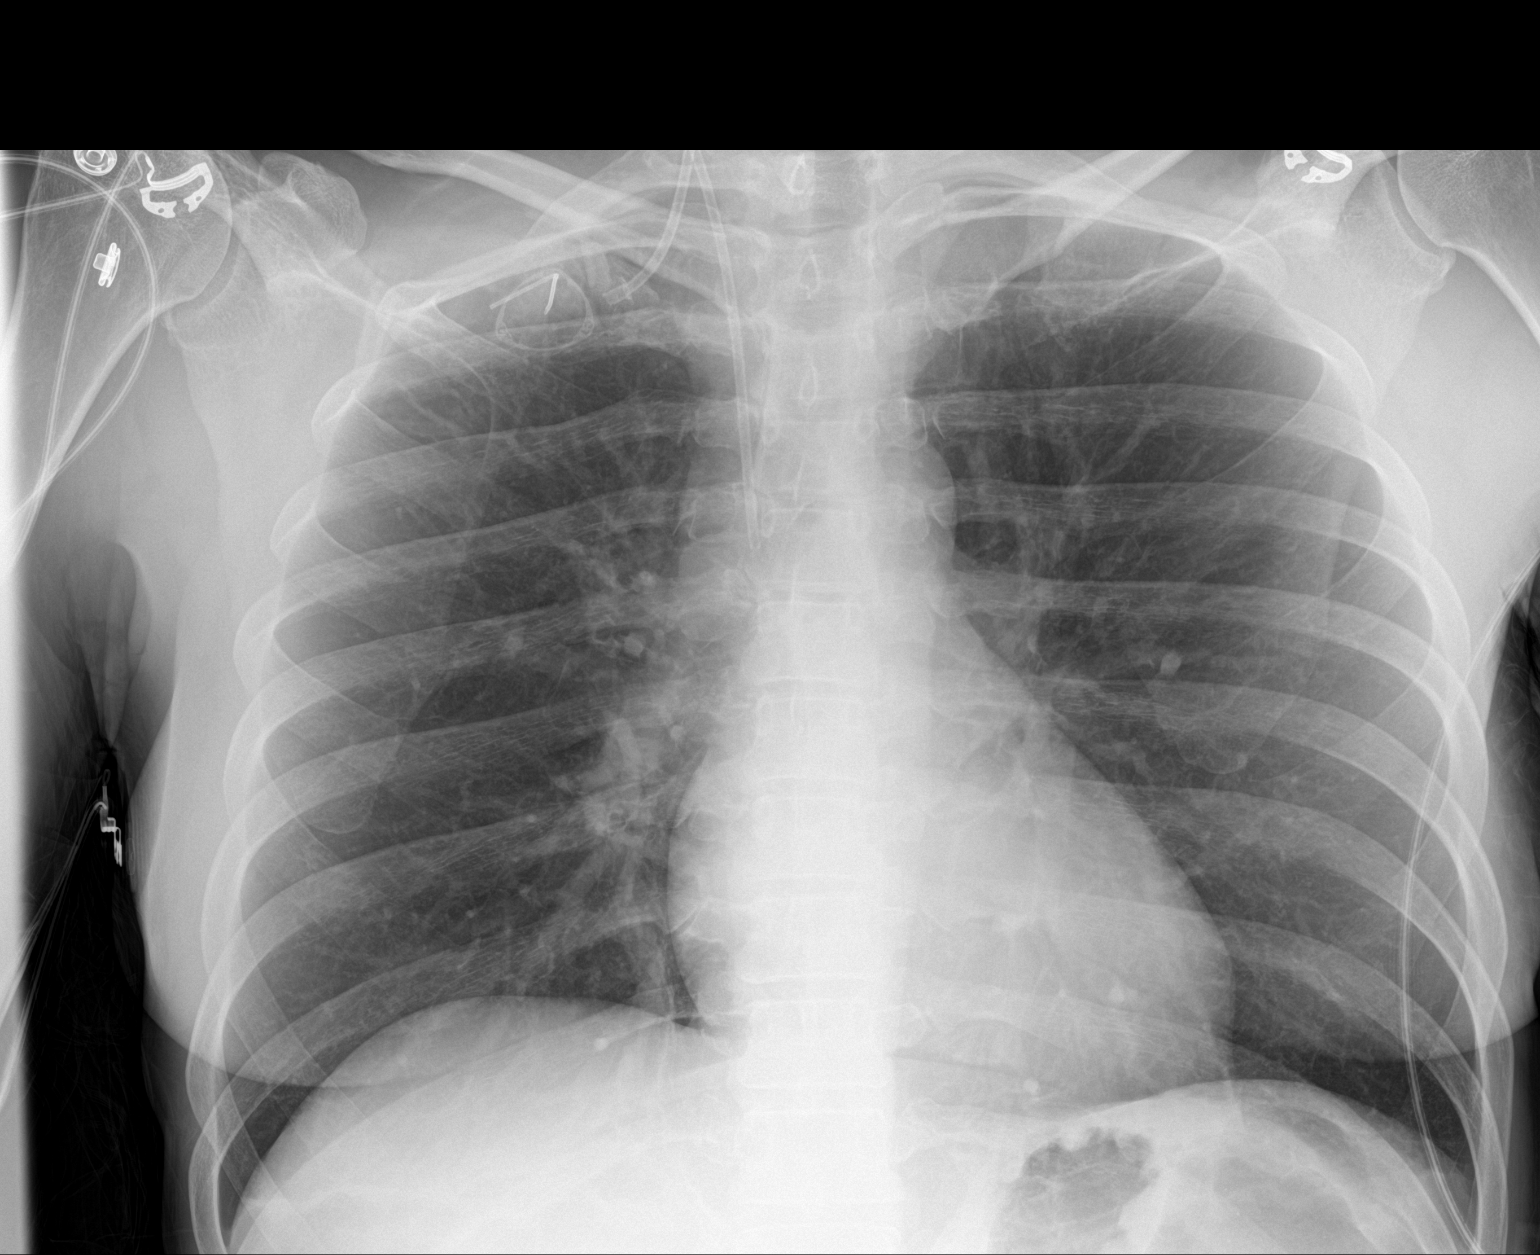

[1 of 1 positions shown; findings below may reference images not displayed]

FINDINGS: Cardiac size is within normal limits. There are no signs of
pulmonary edema or focal pulmonary consolidation. There is no
pleural effusion or pneumothorax. Right IJ chest port is seen with
its tip in the superior vena cava.
IMPRESSION: Tip of right IJ chest port is seen in the superior vena cava. Lung
fields are clear of any infiltrates or pulmonary edema. There is no
pneumothorax.

## 2023-03-11 ENCOUNTER — Encounter: Payer: Self-pay | Admitting: Hematology

## 2023-03-13 ENCOUNTER — Encounter (HOSPITAL_COMMUNITY): Payer: Self-pay | Admitting: Orthopaedic Surgery

## 2023-03-14 LAB — SURGICAL PATHOLOGY

## 2023-03-15 ENCOUNTER — Telehealth: Payer: Self-pay

## 2023-03-15 NOTE — Telephone Encounter (Signed)
-----   Message from Carver Fila, MD sent at 03/15/2023 12:55 PM EDT ----- Could you please call this patient to assure she received the mychart message about her recent biopsy? Thank you

## 2023-03-15 NOTE — Telephone Encounter (Signed)
LM for Terri Mckinney  that Dr. Pricilla Holm wanted to be sure she saw or knows that the biopsy from the cervix is negative for any cancer or precancer.  Requested a call back to verify that she is  aware  of the above results.

## 2023-03-16 ENCOUNTER — Telehealth: Payer: Self-pay | Admitting: Oncology

## 2023-03-16 ENCOUNTER — Telehealth: Payer: Self-pay

## 2023-03-16 ENCOUNTER — Encounter: Payer: Self-pay | Admitting: Hematology

## 2023-03-16 NOTE — Telephone Encounter (Signed)
Called [facility] and spoke to the triage nurse.  Asked to schedule a urologist to inject ICG in patient's ureters at the start of surgery on 04/04/23 at the [facility] Main OR.  They are going to check to see which doctor will be on the case and will let us know.

## 2023-03-16 NOTE — Telephone Encounter (Signed)
Pt is aware of negative biopsy results. She was thankful for the call

## 2023-03-16 NOTE — Telephone Encounter (Signed)
LM for Terri Mckinney that Dr. Pricilla Holm wanted to be sure she saw or knows that the biopsy from the cervix is negative for any cancer or precancer.

## 2023-03-19 ENCOUNTER — Ambulatory Visit: Payer: Self-pay | Admitting: General Surgery

## 2023-03-19 ENCOUNTER — Encounter: Payer: Self-pay | Admitting: Hematology

## 2023-03-19 DIAGNOSIS — Z720 Tobacco use: Secondary | ICD-10-CM

## 2023-03-19 LAB — CYTOLOGY - PAP
Comment: NEGATIVE
Diagnosis: UNDETERMINED — AB
High risk HPV: NEGATIVE

## 2023-03-19 NOTE — Telephone Encounter (Signed)
Shanda Bumps from [facility] called back and Dr. Liliane Shi has been assigned to the case.

## 2023-03-23 ENCOUNTER — Other Ambulatory Visit: Payer: Self-pay | Admitting: Urology

## 2023-03-27 NOTE — Progress Notes (Signed)
Anesthesia Review:  PCP: Chtisty Hawks, NO LOV 08/18/22.  Cardiologist : Chest x-ray : Ct CHEST- 02/11/23  EKG : Echo : Stress test: Cardiac Cath :  Activity level:  Sleep Study/ CPAP : Fasting Blood Sugar :      / Checks Blood Sugar -- times a day:   Blood Thinner/ Instructions /Last Dose: ASA / Instructions/ Last Dose :

## 2023-03-27 NOTE — Patient Instructions (Signed)
SURGICAL WAITING ROOM VISITATION  Patients having surgery or a procedure may have no more than 2 support people in the waiting area - these visitors may rotate.    Children under the age of 71 must have an adult with them who is not the patient.  Due to an increase in RSV and influenza rates and associated hospitalizations, children ages 45 and under may not visit patients in [facility] hospitals.  If the patient needs to stay at the hospital during part of their recovery, the visitor guidelines for inpatient rooms apply. Pre-op nurse will coordinate an appropriate time for 1 support person to accompany patient in pre-op.  This support person may not rotate.    Please refer to the [facility] website for the visitor guidelines for Inpatients (after your surgery is over and you are in a regular room).       Your procedure is scheduled on:  04/04/2023    Report to [facility] Main Entrance    Report to admitting at   1030 AM   Call this number if you have problems the morning of surgery [phone]    Clear liquid diet on day of bowel prep.  Follow bowel prep instructions per MD>     After Midnight you may have the following liquids until _ 0930_____ AM DAY OF SURGERY  Water Non-Citrus Juices (without pulp, NO RED-Apple, White grape, White cranberry) Black Coffee (NO MILK/CREAM OR CREAMERS, sugar ok)  Clear Tea (NO MILK/CREAM OR CREAMERS, sugar ok) regular and decaf                             Plain Jell-O (NO RED)                                           Fruit ices (not with fruit pulp, NO RED)                                     Popsicles (NO RED)                                                               Sports drinks like Gatorade (NO RED)              Drink 2 Ensure/G2 drinks AT 10:00 PM the night before surgery.        The day of surgery:  Dent visit.rink ONE (1) Pre-Surgery Clear Ensure or G2 at   0930am ( have completed by )  morning of surgery.    Nothing  else to drink after completing the  Pre-Surgery Clear Ensure or G2.          If you have questions, please contact your surgeon's office.   FOLLOW BOWEL PREP AND ANY ADDITIONAL PRE OP INSTRUCTIONS YOU RECEIVED FROM YOUR SURGEON'S OFFICE!!!     Oral Hygiene is also important to reduce your risk of infection.  Remember - BRUSH YOUR TEETH THE MORNING OF SURGERY WITH YOUR REGULAR TOOTHPASTE  DENTURES WILL BE REMOVED PRIOR TO SURGERY PLEASE DO NOT APPLY "Poly grip" OR ADHESIVES!!!   Do NOT smoke after Midnight   Take these medicines the morning of surgery with A SIP OF WATER:  none   DO NOT TAKE ANY ORAL DIABETIC MEDICATIONS DAY OF YOUR SURGERY  Bring CPAP mask and tubing day of surgery.                              You may not have any metal on your body including hair pins, jewelry, and body piercing             Do not wear make-up, lotions, powders, perfumes/cologne, or deodorant  Do not wear nail polish including gel and S&S, artificial/acrylic nails, or any other type of covering on natural nails including finger and toenails. If you have artificial nails, gel coating, etc. that needs to be removed by a nail salon please have this removed prior to surgery or surgery may need to be canceled/ delayed if the surgeon/ anesthesia feels like they are unable to be safely monitored.   Do not shave  48 hours prior to surgery.               Men may shave face and neck.   Do not bring valuables to the hospital. [facility] IS NOT             RESPONSIBLE   FOR VALUABLES.   Contacts, glasses, dentures or bridgework may not be worn into surgery.   Bring small overnight bag day of surgery.   DO NOT BRING YOUR HOME MEDICATIONS TO THE HOSPITAL. PHARMACY WILL DISPENSE MEDICATIONS LISTED ON YOUR MEDICATION LIST TO YOU DURING YOUR ADMISSION IN THE HOSPITAL!    Patients discharged on the day of surgery will not be allowed to drive home.  Someone NEEDS to stay  with you for the first 24 hours after anesthesia.   Special Instructions: Bring a copy of your healthcare power of attorney and living will documents the day of surgery if you haven't scanned them before.              Please read over the following fact sheets you were given: IF YOU HAVE QUESTIONS ABOUT YOUR PRE-OP INSTRUCTIONS PLEASE CALL [phone]   If you received a COVID test during your pre-op visit  it is requested that you wear a mask when out in public, stay away from anyone that may not be feeling well and notify your surgeon if you develop symptoms. If you test positive for Covid or have been in contact with anyone that has tested positive in the last 10 days please notify you surgeon.    [facility] - Preparing for Surgery Before surgery, you can play an important role.  Because skin is not sterile, your skin needs to be as free of germs as possible.  You can reduce the number of germs on your skin by washing with CHG (chlorahexidine gluconate) soap before surgery.  CHG is an antiseptic cleaner which kills germs and bonds with the skin to continue killing germs even after washing. Please DO NOT use if you have an allergy to CHG or antibacterial soaps.  If your skin becomes reddened/irritated stop using the CHG and inform your nurse when you arrive at Short Stay. Do not shave (including legs and underarms) for  at least 48 hours prior to the first CHG shower.  You may shave your face/neck. Please follow these instructions carefully:  1.  Shower with CHG Soap the night before surgery and the  morning of Surgery.  2.  If you choose to wash your hair, wash your hair first as usual with your  normal  shampoo.  3.  After you shampoo, rinse your hair and body thoroughly to remove the  shampoo.                           4.  Use CHG as you would any other liquid soap.  You can apply chg directly  to the skin and wash                       Gently with a scrungie or clean washcloth.  5.  Apply  the CHG Soap to your body ONLY FROM THE NECK DOWN.   Do not use on face/ open                           Wound or open sores. Avoid contact with eyes, ears mouth and genitals (private parts).                       Wash face,  Genitals (private parts) with your normal soap.             6.  Wash thoroughly, paying special attention to the area where your surgery  will be performed.  7.  Thoroughly rinse your body with warm water from the neck down.  8.  DO NOT shower/wash with your normal soap after using and rinsing off  the CHG Soap.                9.  Pat yourself dry with a clean towel.            10.  Wear clean pajamas.            11.  Place clean sheets on your bed the night of your first shower and do not  sleep with pets. Day of Surgery : Do not apply any lotions/deodorants the morning of surgery.  Please wear clean clothes to the hospital/surgery center.  FAILURE TO FOLLOW THESE INSTRUCTIONS MAY RESULT IN THE CANCELLATION OF YOUR SURGERY PATIENT SIGNATURE_________________________________  NURSE SIGNATURE__________________________________  ________________________________________________________________________

## 2023-03-29 ENCOUNTER — Other Ambulatory Visit: Payer: Self-pay

## 2023-03-29 ENCOUNTER — Encounter (HOSPITAL_COMMUNITY): Payer: Self-pay

## 2023-03-29 ENCOUNTER — Encounter (HOSPITAL_COMMUNITY)
Admission: RE | Admit: 2023-03-29 | Discharge: 2023-03-29 | Disposition: A | Discharge status: Home / Self Care | Payer: BC Managed Care – PPO | Source: Ambulatory Visit | Attending: General Surgery | Admitting: General Surgery

## 2023-03-29 DIAGNOSIS — Z01812 Encounter for preprocedural laboratory examination: Secondary | ICD-10-CM | POA: Diagnosis present

## 2023-03-29 DIAGNOSIS — Z01818 Encounter for other preprocedural examination: Secondary | ICD-10-CM

## 2023-03-29 HISTORY — DX: Myoneural disorder, unspecified: G70.9

## 2023-03-29 LAB — CBC
HCT: 41.2 % (ref 36.0–46.0)
Hemoglobin: 13.7 g/dL (ref 12.0–15.0)
MCH: 34.1 pg — ABNORMAL HIGH (ref 26.0–34.0)
MCHC: 33.3 g/dL (ref 30.0–36.0)
MCV: 102.5 fL — ABNORMAL HIGH (ref 80.0–100.0)
Platelets: 254 10*3/uL (ref 150–400)
RBC: 4.02 MIL/uL (ref 3.87–5.11)
RDW: 12.9 % (ref 11.5–15.5)
WBC: 6.8 10*3/uL (ref 4.0–10.5)
nRBC: 0 % (ref 0.0–0.2)

## 2023-03-29 NOTE — Progress Notes (Signed)
Anesthesia note: Bowel prep reminder:  Yes PCP - Jannifer Rodney FNP Cardiologist -none Other- Oncology-Dr. Marice Potter  Chest x-ray - 02/11/23-epic EKG - no Stress Test - no ECHO - no Cardiac Cath - no CABG-no Pacemaker/ICD device last checked:na  Sleep Study - no CPAP -  Pt is pre diabetic-no CBG at PAT visit- Fasting Blood Sugar at home- Checks Blood Sugar _____ Blood Thinner:no Blood Thinner Instructions: Aspirin Instructions: Last Dose:  Anesthesia review: Yes  reason:  Patient denies shortness of breath, fever, cough and chest pain at PAT appointment. 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 She has a colostomy on the Lt mid abd. And a Port a cath in upper Rt chest. She reports no SOB with activities. Patient verbalized understanding of instructions that were given to them at the PAT appointment. Patient was also instructed that they will need to review over the PAT instructions again at home before surgery.yes

## 2023-04-03 ENCOUNTER — Encounter (HOSPITAL_COMMUNITY): Payer: Self-pay | Admitting: General Surgery

## 2023-04-03 ENCOUNTER — Telehealth: Payer: Self-pay

## 2023-04-03 NOTE — Telephone Encounter (Signed)
Telephone call to check on pre-operative status.  Patient compliant with pre-operative instructions.  Reinforced nothing to eat after midnight. Clear liquids until 0915. Patient to arrive at 1015.  No questions or concerns voiced.  Instructed to call for any needs. Pt to follow Dr. Maurine Minister orders for  bowel prep. Pt receptive.

## 2023-04-03 NOTE — Anesthesia Preprocedure Evaluation (Signed)
Anesthesia Evaluation  Patient identified by MRN, date of birth, ID band Patient awake    Reviewed: Allergy & Precautions, NPO status , Patient's Chart, lab work & pertinent test results  Airway Mallampati: II  TM Distance: >3 FB Neck ROM: Full    Dental no notable dental hx. (+) Caps, Missing, Dental Advisory Given, Teeth Intact   Pulmonary Current Smoker and Patient abstained from smoking.   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  Neuromuscular disease  negative psych ROS   GI/Hepatic Neg liver ROS,,,Rectal Ca S/P diverting colostomy S/P ChemoRx   Endo/Other  Hyperlipidemia  Renal/GU negative Renal ROS  negative genitourinary   Musculoskeletal negative musculoskeletal ROS (+)    Abdominal   Peds  Hematology negative hematology ROS (+)   Anesthesia Other Findings   Reproductive/Obstetrics                             Anesthesia Physical Anesthesia Plan  ASA: 2  Anesthesia Plan: General   Post-op Pain Management: Precedex and Ofirmev IV (intra-op)*   Induction: Intravenous  PONV Risk Score and Plan: 4 or greater and Scopolamine patch - Pre-op, Treatment may vary due to age or medical condition, Midazolam, Dexamethasone and Ondansetron  Airway Management Planned: Oral ETT  Additional Equipment: None  Intra-op Plan:   Post-operative Plan: Extubation in OR  Informed Consent: I have reviewed the patients History and Physical, chart, labs and discussed the procedure including the risks, benefits and alternatives for the proposed anesthesia with the patient or authorized representative who has indicated his/her understanding and acceptance.     Dental advisory given  Plan Discussed with: CRNA and  Anesthesiologist  Anesthesia Plan Comments:         Anesthesia Quick Evaluation

## 2023-04-04 ENCOUNTER — Inpatient Hospital Stay (HOSPITAL_COMMUNITY): Payer: BC Managed Care – PPO | Admitting: Certified Registered"

## 2023-04-04 ENCOUNTER — Inpatient Hospital Stay (HOSPITAL_COMMUNITY)
Admission: RE | Admit: 2023-04-04 | Discharge: 2023-04-13 | DRG: 330 | Disposition: A | Discharge status: Home / Self Care | Payer: BC Managed Care – PPO | Attending: Surgery | Admitting: Surgery

## 2023-04-04 ENCOUNTER — Encounter (HOSPITAL_COMMUNITY): Payer: Self-pay | Admitting: General Surgery

## 2023-04-04 ENCOUNTER — Other Ambulatory Visit: Payer: Self-pay

## 2023-04-04 ENCOUNTER — Encounter (HOSPITAL_COMMUNITY)
Admission: RE | Disposition: A | Discharge status: Home / Self Care | Payer: Self-pay | Source: Home / Self Care | Attending: General Surgery

## 2023-04-04 DIAGNOSIS — N736 Female pelvic peritoneal adhesions (postinfective): Secondary | ICD-10-CM | POA: Diagnosis present

## 2023-04-04 DIAGNOSIS — Z8261 Family history of arthritis: Secondary | ICD-10-CM

## 2023-04-04 DIAGNOSIS — Z806 Family history of leukemia: Secondary | ICD-10-CM

## 2023-04-04 DIAGNOSIS — Z803 Family history of malignant neoplasm of breast: Secondary | ICD-10-CM

## 2023-04-04 DIAGNOSIS — Z823 Family history of stroke: Secondary | ICD-10-CM

## 2023-04-04 DIAGNOSIS — Z800 Family history of malignant neoplasm of digestive organs: Secondary | ICD-10-CM | POA: Diagnosis not present

## 2023-04-04 DIAGNOSIS — Z801 Family history of malignant neoplasm of trachea, bronchus and lung: Secondary | ICD-10-CM | POA: Diagnosis not present

## 2023-04-04 DIAGNOSIS — Z822 Family history of deafness and hearing loss: Secondary | ICD-10-CM | POA: Diagnosis not present

## 2023-04-04 DIAGNOSIS — K567 Ileus, unspecified: Secondary | ICD-10-CM | POA: Diagnosis not present

## 2023-04-04 DIAGNOSIS — N888 Other specified noninflammatory disorders of cervix uteri: Secondary | ICD-10-CM | POA: Diagnosis not present

## 2023-04-04 DIAGNOSIS — R739 Hyperglycemia, unspecified: Secondary | ICD-10-CM | POA: Diagnosis not present

## 2023-04-04 DIAGNOSIS — Z888 Allergy status to other drugs, medicaments and biological substances status: Secondary | ICD-10-CM

## 2023-04-04 DIAGNOSIS — E875 Hyperkalemia: Secondary | ICD-10-CM | POA: Diagnosis not present

## 2023-04-04 DIAGNOSIS — Z833 Family history of diabetes mellitus: Secondary | ICD-10-CM | POA: Diagnosis not present

## 2023-04-04 DIAGNOSIS — C20 Malignant neoplasm of rectum: Secondary | ICD-10-CM | POA: Diagnosis not present

## 2023-04-04 DIAGNOSIS — G629 Polyneuropathy, unspecified: Secondary | ICD-10-CM | POA: Diagnosis present

## 2023-04-04 DIAGNOSIS — Z01818 Encounter for other preprocedural examination: Principal | ICD-10-CM

## 2023-04-04 DIAGNOSIS — Z85048 Personal history of other malignant neoplasm of rectum, rectosigmoid junction, and anus: Secondary | ICD-10-CM

## 2023-04-04 DIAGNOSIS — K9189 Other postprocedural complications and disorders of digestive system: Secondary | ICD-10-CM | POA: Diagnosis not present

## 2023-04-04 DIAGNOSIS — Z885 Allergy status to narcotic agent status: Secondary | ICD-10-CM | POA: Diagnosis not present

## 2023-04-04 DIAGNOSIS — C787 Secondary malignant neoplasm of liver and intrahepatic bile duct: Secondary | ICD-10-CM | POA: Diagnosis present

## 2023-04-04 DIAGNOSIS — Z8249 Family history of ischemic heart disease and other diseases of the circulatory system: Secondary | ICD-10-CM | POA: Diagnosis not present

## 2023-04-04 DIAGNOSIS — N952 Postmenopausal atrophic vaginitis: Secondary | ICD-10-CM | POA: Diagnosis present

## 2023-04-04 DIAGNOSIS — C539 Malignant neoplasm of cervix uteri, unspecified: Secondary | ICD-10-CM | POA: Diagnosis present

## 2023-04-04 DIAGNOSIS — Z923 Personal history of irradiation: Secondary | ICD-10-CM | POA: Diagnosis not present

## 2023-04-04 DIAGNOSIS — Z83438 Family history of other disorder of lipoprotein metabolism and other lipidemia: Secondary | ICD-10-CM

## 2023-04-04 DIAGNOSIS — Z9221 Personal history of antineoplastic chemotherapy: Secondary | ICD-10-CM

## 2023-04-04 DIAGNOSIS — Z95828 Presence of other vascular implants and grafts: Secondary | ICD-10-CM

## 2023-04-04 DIAGNOSIS — R599 Enlarged lymph nodes, unspecified: Secondary | ICD-10-CM | POA: Diagnosis present

## 2023-04-04 DIAGNOSIS — R12 Heartburn: Secondary | ICD-10-CM | POA: Diagnosis not present

## 2023-04-04 DIAGNOSIS — Z932 Ileostomy status: Secondary | ICD-10-CM

## 2023-04-04 DIAGNOSIS — C19 Malignant neoplasm of rectosigmoid junction: Principal | ICD-10-CM | POA: Diagnosis present

## 2023-04-04 DIAGNOSIS — F320 Major depressive disorder, single episode, mild: Secondary | ICD-10-CM | POA: Diagnosis present

## 2023-04-04 DIAGNOSIS — Z79890 Hormone replacement therapy (postmenopausal): Secondary | ICD-10-CM | POA: Diagnosis not present

## 2023-04-04 DIAGNOSIS — F17210 Nicotine dependence, cigarettes, uncomplicated: Secondary | ICD-10-CM | POA: Diagnosis present

## 2023-04-04 DIAGNOSIS — E8940 Asymptomatic postprocedural ovarian failure: Secondary | ICD-10-CM

## 2023-04-04 HISTORY — PX: XI ROBOTIC ASSISTED LOWER ANTERIOR RESECTION: SHX6558

## 2023-04-04 HISTORY — PX: ROBOTIC ASSISTED TOTAL HYSTERECTOMY WITH BILATERAL SALPINGO OOPHERECTOMY: SHX6086

## 2023-04-04 HISTORY — PX: ILEOSTOMY: SHX1783

## 2023-04-04 LAB — TYPE AND SCREEN
ABO/RH(D): A POS
Antibody Screen: NEGATIVE

## 2023-04-04 LAB — POCT PREGNANCY, URINE: Preg Test, Ur: NEGATIVE

## 2023-04-04 SURGERY — RESECTION, RECTUM, LOW ANTERIOR, ROBOT-ASSISTED
Anesthesia: General

## 2023-04-04 MED ORDER — BUPIVACAINE HCL 0.25 % IJ SOLN
INTRAMUSCULAR | Status: DC | PRN
  Administered 2023-04-04: 30 mL

## 2023-04-04 MED ORDER — DIPHENHYDRAMINE HCL 50 MG/ML IJ SOLN: 12.5000 mg | Freq: Four times a day (QID) | INTRAMUSCULAR | Status: DC | PRN

## 2023-04-04 MED ORDER — KCL IN DEXTROSE-NACL 20-5-0.45 MEQ/L-%-% IV SOLN
INTRAVENOUS | Status: DC
  Filled 2023-04-04: qty 1000

## 2023-04-04 MED ORDER — FENTANYL CITRATE (PF) 100 MCG/2ML IJ SOLN
INTRAMUSCULAR | Status: AC
  Filled 2023-04-04: qty 2

## 2023-04-04 MED ORDER — HYDROMORPHONE HCL 1 MG/ML IJ SOLN
INTRAMUSCULAR | Status: DC | PRN
  Administered 2023-04-04: .4 mg via INTRAVENOUS

## 2023-04-04 MED ORDER — HEPARIN SODIUM (PORCINE) 5000 UNIT/ML IJ SOLN
5000.0000 [IU] | Freq: Once | INTRAMUSCULAR | Status: AC
  Administered 2023-04-04: 5000 [IU] via SUBCUTANEOUS
  Filled 2023-04-04: qty 1

## 2023-04-04 MED ORDER — FENTANYL CITRATE (PF) 100 MCG/2ML IJ SOLN
INTRAMUSCULAR | Status: DC | PRN
  Administered 2023-04-04: 100 ug via INTRAVENOUS
  Administered 2023-04-04 (×6): 50 ug via INTRAVENOUS

## 2023-04-04 MED ORDER — ORAL CARE MOUTH RINSE: 15.0000 mL | Freq: Once | OROMUCOSAL | Status: AC

## 2023-04-04 MED ORDER — ALVIMOPAN 12 MG PO CAPS
12.0000 mg | ORAL_CAPSULE | ORAL | Status: AC
  Administered 2023-04-04: 12 mg via ORAL
  Filled 2023-04-04: qty 1

## 2023-04-04 MED ORDER — LACTATED RINGERS IV SOLN: INTRAVENOUS | Status: DC | PRN

## 2023-04-04 MED ORDER — PHENYLEPHRINE HCL-NACL 20-0.9 MG/250ML-% IV SOLN
INTRAVENOUS | Status: DC | PRN
  Administered 2023-04-04: 20 ug/min via INTRAVENOUS

## 2023-04-04 MED ORDER — HYDROMORPHONE HCL 1 MG/ML IJ SOLN
INTRAMUSCULAR | Status: AC
  Administered 2023-04-04: 0.5 mg via INTRAVENOUS
  Filled 2023-04-04: qty 2

## 2023-04-04 MED ORDER — ROCURONIUM BROMIDE 10 MG/ML (PF) SYRINGE
PREFILLED_SYRINGE | INTRAVENOUS | Status: DC | PRN
  Administered 2023-04-04: 30 mg via INTRAVENOUS
  Administered 2023-04-04 (×2): 20 mg via INTRAVENOUS
  Administered 2023-04-04: 30 mg via INTRAVENOUS
  Administered 2023-04-04: 80 mg via INTRAVENOUS

## 2023-04-04 MED ORDER — PROPOFOL 10 MG/ML IV BOLUS
INTRAVENOUS | Status: DC | PRN
  Administered 2023-04-04: 150 mg via INTRAVENOUS
  Administered 2023-04-04: 50 mg via INTRAVENOUS

## 2023-04-04 MED ORDER — ONDANSETRON HCL 4 MG/2ML IJ SOLN
4.0000 mg | Freq: Four times a day (QID) | INTRAMUSCULAR | Status: DC | PRN
  Administered 2023-04-04 – 2023-04-07 (×6): 4 mg via INTRAVENOUS
  Filled 2023-04-04 (×6): qty 2

## 2023-04-04 MED ORDER — ENOXAPARIN SODIUM 40 MG/0.4ML IJ SOSY
40.0000 mg | PREFILLED_SYRINGE | INTRAMUSCULAR | Status: DC
  Administered 2023-04-05 – 2023-04-13 (×9): 40 mg via SUBCUTANEOUS
  Filled 2023-04-04 (×9): qty 0.4

## 2023-04-04 MED ORDER — MIDAZOLAM HCL 2 MG/2ML IJ SOLN
INTRAMUSCULAR | Status: AC
  Filled 2023-04-04: qty 2

## 2023-04-04 MED ORDER — STERILE WATER FOR INJECTION IJ SOLN
INTRAMUSCULAR | Status: DC | PRN
  Administered 2023-04-04: 15 mL via INTRAVESICAL

## 2023-04-04 MED ORDER — GABAPENTIN 300 MG PO CAPS
300.0000 mg | ORAL_CAPSULE | ORAL | Status: AC
  Administered 2023-04-04: 300 mg via ORAL
  Filled 2023-04-04: qty 1

## 2023-04-04 MED ORDER — LACTATED RINGERS IV SOLN: INTRAVENOUS | Status: DC

## 2023-04-04 MED ORDER — ACETAMINOPHEN 500 MG PO TABS
1000.0000 mg | ORAL_TABLET | ORAL | Status: AC
  Administered 2023-04-04: 1000 mg via ORAL
  Filled 2023-04-04: qty 2

## 2023-04-04 MED ORDER — DEXAMETHASONE SODIUM PHOSPHATE 10 MG/ML IJ SOLN
INTRAMUSCULAR | Status: AC
  Filled 2023-04-04: qty 1

## 2023-04-04 MED ORDER — POLYETHYLENE GLYCOL 3350 17 GM/SCOOP PO POWD: 1.0000 | Freq: Once | ORAL | Status: DC

## 2023-04-04 MED ORDER — ALVIMOPAN 12 MG PO CAPS
12.0000 mg | ORAL_CAPSULE | Freq: Two times a day (BID) | ORAL | Status: DC
  Administered 2023-04-05 (×2): 12 mg via ORAL
  Filled 2023-04-04 (×3): qty 1

## 2023-04-04 MED ORDER — HYDROMORPHONE HCL 1 MG/ML IJ SOLN
0.5000 mg | INTRAMUSCULAR | Status: DC | PRN
  Administered 2023-04-04 – 2023-04-06 (×4): 0.5 mg via INTRAVENOUS
  Filled 2023-04-04 (×6): qty 0.5

## 2023-04-04 MED ORDER — ALUM & MAG HYDROXIDE-SIMETH 200-200-20 MG/5ML PO SUSP
30.0000 mL | Freq: Four times a day (QID) | ORAL | Status: DC | PRN
  Administered 2023-04-04 – 2023-04-07 (×2): 30 mL via ORAL
  Filled 2023-04-04 (×2): qty 30

## 2023-04-04 MED ORDER — SUGAMMADEX SODIUM 200 MG/2ML IV SOLN
INTRAVENOUS | Status: DC | PRN
  Administered 2023-04-04: 200 mg via INTRAVENOUS

## 2023-04-04 MED ORDER — CHLORHEXIDINE GLUCONATE 0.12 % MT SOLN
15.0000 mL | Freq: Once | OROMUCOSAL | Status: AC
  Administered 2023-04-04: 15 mL via OROMUCOSAL

## 2023-04-04 MED ORDER — BISACODYL 5 MG PO TBEC: 20.0000 mg | DELAYED_RELEASE_TABLET | Freq: Once | ORAL | Status: DC

## 2023-04-04 MED ORDER — SODIUM CHLORIDE 0.9 % IV SOLN
2.0000 g | Freq: Two times a day (BID) | INTRAVENOUS | Status: AC
  Administered 2023-04-04: 2 g via INTRAVENOUS
  Filled 2023-04-04: qty 2

## 2023-04-04 MED ORDER — ACETAMINOPHEN 500 MG PO TABS
1000.0000 mg | ORAL_TABLET | Freq: Four times a day (QID) | ORAL | Status: DC
  Administered 2023-04-04 – 2023-04-07 (×13): 1000 mg via ORAL
  Filled 2023-04-04 (×13): qty 2

## 2023-04-04 MED ORDER — STERILE WATER FOR INJECTION IJ SOLN
INTRAMUSCULAR | Status: AC
  Filled 2023-04-04: qty 10

## 2023-04-04 MED ORDER — SIMETHICONE 80 MG PO CHEW
40.0000 mg | CHEWABLE_TABLET | Freq: Four times a day (QID) | ORAL | Status: DC | PRN
  Administered 2023-04-05 (×2): 40 mg via ORAL
  Filled 2023-04-04 (×2): qty 1

## 2023-04-04 MED ORDER — ONDANSETRON HCL 4 MG/2ML IJ SOLN
INTRAMUSCULAR | Status: DC | PRN
  Administered 2023-04-04: 4 mg via INTRAVENOUS

## 2023-04-04 MED ORDER — BUPIVACAINE LIPOSOME 1.3 % IJ SUSP: 20.0000 mL | Freq: Once | INTRAMUSCULAR | Status: DC

## 2023-04-04 MED ORDER — HYDROMORPHONE HCL 1 MG/ML IJ SOLN
0.2500 mg | INTRAMUSCULAR | Status: DC | PRN
  Administered 2023-04-04 (×3): 0.5 mg via INTRAVENOUS

## 2023-04-04 MED ORDER — DEXMEDETOMIDINE HCL IN NACL 80 MCG/20ML IV SOLN
INTRAVENOUS | Status: DC | PRN
  Administered 2023-04-04 (×2): 8 ug via INTRAVENOUS
  Administered 2023-04-04: 4 ug via INTRAVENOUS

## 2023-04-04 MED ORDER — ONDANSETRON HCL 4 MG PO TABS: 4.0000 mg | ORAL_TABLET | Freq: Four times a day (QID) | ORAL | Status: DC | PRN

## 2023-04-04 MED ORDER — MIDAZOLAM HCL 2 MG/2ML IJ SOLN
INTRAMUSCULAR | Status: DC | PRN
  Administered 2023-04-04: 2 mg via INTRAVENOUS

## 2023-04-04 MED ORDER — ROCURONIUM BROMIDE 10 MG/ML (PF) SYRINGE
PREFILLED_SYRINGE | INTRAVENOUS | Status: AC
  Filled 2023-04-04: qty 10

## 2023-04-04 MED ORDER — LIDOCAINE 2% (20 MG/ML) 5 ML SYRINGE
INTRAMUSCULAR | Status: DC | PRN
  Administered 2023-04-04: 40 mg via INTRAVENOUS

## 2023-04-04 MED ORDER — HYDROMORPHONE HCL 2 MG/ML IJ SOLN
INTRAMUSCULAR | Status: AC
  Filled 2023-04-04: qty 1

## 2023-04-04 MED ORDER — SODIUM CHLORIDE 0.9 % IV SOLN
2.0000 g | INTRAVENOUS | Status: AC
  Administered 2023-04-04: 2 g via INTRAVENOUS
  Filled 2023-04-04: qty 2

## 2023-04-04 MED ORDER — ENSURE SURGERY PO LIQD
237.0000 mL | Freq: Two times a day (BID) | ORAL | Status: DC
  Administered 2023-04-05 (×2): 237 mL via ORAL

## 2023-04-04 MED ORDER — BUPIVACAINE HCL 0.25 % IJ SOLN
INTRAMUSCULAR | Status: AC
  Filled 2023-04-04: qty 1

## 2023-04-04 MED ORDER — BUPIVACAINE LIPOSOME 1.3 % IJ SUSP
INTRAMUSCULAR | Status: AC
  Filled 2023-04-04: qty 20

## 2023-04-04 MED ORDER — DEXAMETHASONE SODIUM PHOSPHATE 10 MG/ML IJ SOLN
INTRAMUSCULAR | Status: DC | PRN
  Administered 2023-04-04: 4 mg via INTRAVENOUS

## 2023-04-04 MED ORDER — LACTATED RINGERS IR SOLN
Status: DC | PRN
  Administered 2023-04-04: 1000 mL

## 2023-04-04 MED ORDER — DIPHENHYDRAMINE HCL 12.5 MG/5ML PO ELIX: 12.5000 mg | ORAL_SOLUTION | Freq: Four times a day (QID) | ORAL | Status: DC | PRN

## 2023-04-04 MED ORDER — GABAPENTIN 100 MG PO CAPS
300.0000 mg | ORAL_CAPSULE | Freq: Two times a day (BID) | ORAL | Status: DC
  Administered 2023-04-04 – 2023-04-07 (×6): 300 mg via ORAL
  Filled 2023-04-04 (×6): qty 3

## 2023-04-04 MED ORDER — AMISULPRIDE (ANTIEMETIC) 5 MG/2ML IV SOLN: 10.0000 mg | Freq: Once | INTRAVENOUS | Status: DC | PRN

## 2023-04-04 MED ORDER — SACCHAROMYCES BOULARDII 250 MG PO CAPS
250.0000 mg | ORAL_CAPSULE | Freq: Two times a day (BID) | ORAL | Status: DC
  Administered 2023-04-04 – 2023-04-07 (×6): 250 mg via ORAL
  Filled 2023-04-04 (×6): qty 1

## 2023-04-04 MED ORDER — BUPIVACAINE LIPOSOME 1.3 % IJ SUSP
INTRAMUSCULAR | Status: DC | PRN
  Administered 2023-04-04: 20 mL

## 2023-04-04 MED ORDER — PROPOFOL 10 MG/ML IV BOLUS
INTRAVENOUS | Status: AC
  Filled 2023-04-04: qty 20

## 2023-04-04 MED ORDER — 0.9 % SODIUM CHLORIDE (POUR BTL) OPTIME
TOPICAL | Status: DC | PRN
  Administered 2023-04-04: 2000 mL

## 2023-04-04 MED ORDER — ENSURE PRE-SURGERY PO LIQD: 296.0000 mL | Freq: Once | ORAL | Status: DC

## 2023-04-04 MED ORDER — ENSURE PRE-SURGERY PO LIQD: 592.0000 mL | Freq: Once | ORAL | Status: DC

## 2023-04-04 MED ORDER — SCOPOLAMINE 1 MG/3DAYS TD PT72
1.0000 | MEDICATED_PATCH | TRANSDERMAL | Status: DC
  Administered 2023-04-04: 1.5 mg via TRANSDERMAL
  Filled 2023-04-04: qty 1

## 2023-04-04 MED ORDER — ONDANSETRON HCL 4 MG/2ML IJ SOLN
INTRAMUSCULAR | Status: AC
  Filled 2023-04-04: qty 2

## 2023-04-04 MED ORDER — ONDANSETRON HCL 4 MG/2ML IJ SOLN: 4.0000 mg | Freq: Once | INTRAMUSCULAR | Status: DC | PRN

## 2023-04-04 MED ORDER — SODIUM CHLORIDE (PF) 0.9 % IJ SOLN
INTRAMUSCULAR | Status: AC
  Filled 2023-04-04: qty 10

## 2023-04-04 SURGICAL SUPPLY — 149 items
ADH SKN CLS APL DERMABOND .7 (GAUZE/BANDAGES/DRESSINGS) ×1
AGENT HMST KT MTR STRL THRMB (HEMOSTASIS)
APL ESCP 34 STRL LF DISP (HEMOSTASIS)
APPLICATOR SURGIFLO ENDO (HEMOSTASIS) IMPLANT
BAG COUNTER SPONGE SURGICOUNT (BAG) ×1 IMPLANT
BAG LAPAROSCOPIC 12 15 PORT 16 (BASKET) IMPLANT
BAG RETRIEVAL 12/15 (BASKET) ×1
BAG SPNG CNTER NS LX DISP (BAG) ×1
BLADE EXTENDED COATED 6.5IN (ELECTRODE) IMPLANT
BLADE SURG SZ10 CARB STEEL (BLADE) IMPLANT
CANNULA REDUCER 12-8 DVNC XI (CANNULA) IMPLANT
CELLS DAT CNTRL 66122 CELL SVR (MISCELLANEOUS) IMPLANT
COVER BACK TABLE 60X90IN (DRAPES) ×1 IMPLANT
COVER SURGICAL LIGHT HANDLE (MISCELLANEOUS) ×2 IMPLANT
COVER TIP SHEARS 8 DVNC (MISCELLANEOUS) ×2 IMPLANT
DERMABOND ADVANCED .7 DNX12 (GAUZE/BANDAGES/DRESSINGS) ×1 IMPLANT
DRAIN CHANNEL 19F RND (DRAIN) IMPLANT
DRAPE ARM DVNC X/XI (DISPOSABLE) ×8 IMPLANT
DRAPE COLUMN DVNC XI (DISPOSABLE) ×2 IMPLANT
DRAPE SHEET LG 3/4 BI-LAMINATE (DRAPES) ×1 IMPLANT
DRAPE SURG IRRIG POUCH 19X23 (DRAPES) ×2 IMPLANT
DRIVER NDL LRG 8 DVNC XI (INSTRUMENTS) ×1 IMPLANT
DRIVER NDL MEGA 8 DVNC XI (INSTRUMENTS) ×2 IMPLANT
DRIVER NDLE LRG 8 DVNC XI (INSTRUMENTS) ×1 IMPLANT
DRIVER NDLE MEGA DVNC XI (INSTRUMENTS) ×1 IMPLANT
DRSG OPSITE POSTOP 4X10 (GAUZE/BANDAGES/DRESSINGS) IMPLANT
DRSG OPSITE POSTOP 4X6 (GAUZE/BANDAGES/DRESSINGS) IMPLANT
DRSG OPSITE POSTOP 4X8 (GAUZE/BANDAGES/DRESSINGS) IMPLANT
ELECT PENCIL ROCKER SW 15FT (MISCELLANEOUS) ×1 IMPLANT
ELECT REM PT RETURN 15FT ADLT (MISCELLANEOUS) ×2 IMPLANT
ENDOLOOP SUT PDS II  0 18 (SUTURE)
ENDOLOOP SUT PDS II 0 18 (SUTURE) IMPLANT
EVACUATOR SILICONE 100CC (DRAIN) IMPLANT
FORCEPS BPLR FENES DVNC XI (FORCEP) ×1 IMPLANT
FORCEPS PROGRASP DVNC XI (FORCEP) ×1 IMPLANT
GAUZE 4X4 16PLY ~~LOC~~+RFID DBL (SPONGE) ×2 IMPLANT
GLOVE BIO SURGEON STRL SZ 6 (GLOVE) ×4 IMPLANT
GLOVE BIO SURGEON STRL SZ 6.5 (GLOVE) ×4 IMPLANT
GLOVE BIOGEL PI IND STRL 7.0 (GLOVE) ×2 IMPLANT
GLOVE INDICATOR 6.5 STRL GRN (GLOVE) ×1 IMPLANT
GOWN SRG XL LVL 4 BRTHBL STRL (GOWNS) ×1 IMPLANT
GOWN STRL NON-REIN XL LVL4 (GOWNS) ×1
GOWN STRL REUS W/ TWL LRG LVL3 (GOWN DISPOSABLE) ×4 IMPLANT
GOWN STRL REUS W/ TWL XL LVL3 (GOWN DISPOSABLE) ×3 IMPLANT
GOWN STRL REUS W/TWL LRG LVL3 (GOWN DISPOSABLE) ×4
GOWN STRL REUS W/TWL XL LVL3 (GOWN DISPOSABLE) ×3
GRASPER SUT TROCAR 14GX15 (MISCELLANEOUS) IMPLANT
GRASPER TIP-UP FEN DVNC XI (INSTRUMENTS) ×1 IMPLANT
GUIDEWIRE STR DUAL SENSOR (WIRE) IMPLANT
HIBICLENS CHG 4% 4OZ BTL (MISCELLANEOUS) ×2 IMPLANT
HOLDER FOLEY CATH W/STRAP (MISCELLANEOUS) ×1 IMPLANT
IRRIG SUCT STRYKERFLOW 2 WTIP (MISCELLANEOUS) ×2
IRRIGATION SUCT STRKRFLW 2 WTP (MISCELLANEOUS) ×2 IMPLANT
KIT PROCEDURE DVNC SI (MISCELLANEOUS) IMPLANT
KIT TURNOVER KIT A (KITS) IMPLANT
LIGASURE IMPACT 36 18CM CVD LR (INSTRUMENTS) IMPLANT
MANIPULATOR ADVINCU DEL 3.0 PL (MISCELLANEOUS) IMPLANT
MANIPULATOR ADVINCU DEL 3.5 PL (MISCELLANEOUS) IMPLANT
MANIPULATOR UTERINE 4.5 ZUMI (MISCELLANEOUS) IMPLANT
MARKER PEN SURG W/LABELS BLK (STERILIZATION PRODUCTS) IMPLANT
NDL HYPO 21X1.5 SAFETY (NEEDLE) ×1 IMPLANT
NDL INSUFFLATION 14GA 120MM (NEEDLE) ×1 IMPLANT
NDL SPNL 18GX3.5 QUINCKE PK (NEEDLE) IMPLANT
NEEDLE HYPO 21X1.5 SAFETY (NEEDLE) ×1 IMPLANT
NEEDLE INSUFFLATION 14GA 120MM (NEEDLE) ×1 IMPLANT
NEEDLE SPNL 18GX3.5 QUINCKE PK (NEEDLE) IMPLANT
OBTURATOR OPTICAL STND 8 DVNC (TROCAR) ×1
OBTURATOR OPTICALSTD 8 DVNC (TROCAR) ×1 IMPLANT
OCCLUDER COLPOPNEUMO (BALLOONS) IMPLANT
PACK CARDIOVASCULAR III (CUSTOM PROCEDURE TRAY) ×1 IMPLANT
PACK COLON (CUSTOM PROCEDURE TRAY) ×1 IMPLANT
PACK ROBOT GYN CUSTOM WL (TRAY / TRAY PROCEDURE) ×1 IMPLANT
PAD POSITIONING PINK XL (MISCELLANEOUS) ×2 IMPLANT
PORT ACCESS TROCAR AIRSEAL 12 (TROCAR) IMPLANT
RELOAD STAPLE 60 3.5 BLU DVNC (STAPLE) IMPLANT
RELOAD STAPLE 60 4.1 GRN THCK (STAPLE) IMPLANT
RELOAD STAPLE 60 4.3 GRN DVNC (STAPLE) IMPLANT
RELOAD STAPLER 3.5X60 BLU DVNC (STAPLE) IMPLANT
RELOAD STAPLER 4.3X60 GRN DVNC (STAPLE) ×2 IMPLANT
RELOAD STAPLER GREEN 60MM (STAPLE) ×1 IMPLANT
RETRACTOR WND ALEXIS 18 MED (MISCELLANEOUS) IMPLANT
RTRCTR WOUND ALEXIS 18CM MED (MISCELLANEOUS)
SCISSORS LAP 5X35 DISP (ENDOMECHANICALS) IMPLANT
SCISSORS MNPLR CVD DVNC XI (INSTRUMENTS) ×2 IMPLANT
SEAL UNIV 5-12 XI (MISCELLANEOUS) ×6 IMPLANT
SEALER VESSEL EXT DVNC XI (MISCELLANEOUS) ×1 IMPLANT
SET TRI-LUMEN FLTR TB AIRSEAL (TUBING) ×1 IMPLANT
SOL ELECTROSURG ANTI STICK (MISCELLANEOUS) ×1
SOLUTION ELECTROSURG ANTI STCK (MISCELLANEOUS) ×1 IMPLANT
SPIKE FLUID TRANSFER (MISCELLANEOUS) ×1 IMPLANT
SPONGE DRAIN TRACH 4X4 STRL 2S (GAUZE/BANDAGES/DRESSINGS) IMPLANT
SPONGE T-LAP 18X18 ~~LOC~~+RFID (SPONGE) IMPLANT
STAPLER 60 SUREFORM DVNC (STAPLE) IMPLANT
STAPLER ECHELON LONG 60 440 (INSTRUMENTS) IMPLANT
STAPLER ECHELON POWER CIR 29 (STAPLE) IMPLANT
STAPLER ECHELON POWER CIR 31 (STAPLE) IMPLANT
STAPLER RELOAD 3.5X60 BLU DVNC (STAPLE)
STAPLER RELOAD 4.3X60 GRN DVNC (STAPLE) ×2
STAPLER RELOAD GREEN 60MM (STAPLE) ×1
STOPCOCK 4 WAY LG BORE MALE ST (IV SETS) ×2 IMPLANT
SURGIFLO W/THROMBIN 8M KIT (HEMOSTASIS) IMPLANT
SUT ETHILON 2 0 PS N (SUTURE) IMPLANT
SUT MNCRL AB 4-0 PS2 18 (SUTURE) IMPLANT
SUT NOVA NAB GS-21 1 T12 (SUTURE) ×2 IMPLANT
SUT PDS AB 1 TP1 96 (SUTURE) IMPLANT
SUT PROLENE 2 0 KS (SUTURE) IMPLANT
SUT SILK 2 0 (SUTURE) ×1
SUT SILK 2 0 SH CR/8 (SUTURE) IMPLANT
SUT SILK 2-0 18XBRD TIE 12 (SUTURE) ×1 IMPLANT
SUT SILK 3 0 (SUTURE)
SUT SILK 3 0 SH CR/8 (SUTURE) ×1 IMPLANT
SUT SILK 3-0 18XBRD TIE 12 (SUTURE) IMPLANT
SUT V-LOC 180 0-0 GS22 (SUTURE) IMPLANT
SUT V-LOC BARB 180 2/0GR6 GS22 (SUTURE)
SUT VIC AB 0 CT1 27 (SUTURE)
SUT VIC AB 0 CT1 27XBRD ANTBC (SUTURE) IMPLANT
SUT VIC AB 2-0 CT1 27 (SUTURE)
SUT VIC AB 2-0 CT1 TAPERPNT 27 (SUTURE) IMPLANT
SUT VIC AB 2-0 SH 18 (SUTURE) IMPLANT
SUT VIC AB 2-0 SH 27 (SUTURE)
SUT VIC AB 2-0 SH 27X BRD (SUTURE) IMPLANT
SUT VIC AB 3-0 SH 18 (SUTURE) IMPLANT
SUT VIC AB 4-0 PS2 27 (SUTURE) ×2 IMPLANT
SUT VICRYL 0 27 CT2 27 ABS (SUTURE) ×1 IMPLANT
SUT VICRYL 0 UR6 27IN ABS (SUTURE) ×1 IMPLANT
SUT VICRYL 4-0 PS2 18IN ABS (SUTURE) ×2 IMPLANT
SUT VLOC 180 0 9IN  GS21 (SUTURE)
SUT VLOC 180 0 9IN GS21 (SUTURE) IMPLANT
SUTURE V-LC BRB 180 2/0GR6GS22 (SUTURE) IMPLANT
SYR 10ML LL (SYRINGE) IMPLANT
SYR 20ML ECCENTRIC (SYRINGE) ×1 IMPLANT
SYS BAG RETRIEVAL 10MM (BASKET)
SYS LAPSCP GELPORT 120MM (MISCELLANEOUS)
SYS WOUND ALEXIS 18CM MED (MISCELLANEOUS) ×1
SYSTEM BAG RETRIEVAL 10MM (BASKET) IMPLANT
SYSTEM LAPSCP GELPORT 120MM (MISCELLANEOUS) IMPLANT
SYSTEM WOUND ALEXIS 18CM MED (MISCELLANEOUS) IMPLANT
TAPE PAPER 1X10 WHT MICROPORE (GAUZE/BANDAGES/DRESSINGS) IMPLANT
TOWEL OR 17X26 10 PK STRL BLUE (TOWEL DISPOSABLE) IMPLANT
TOWEL OR NON WOVEN STRL DISP B (DISPOSABLE) ×1 IMPLANT
TRAP SPECIMEN MUCUS 40CC (MISCELLANEOUS) IMPLANT
TRAY FOLEY MTR SLVR 16FR STAT (SET/KITS/TRAYS/PACK) ×2 IMPLANT
TROCAR ADV FIXATION 5X100MM (TROCAR) ×1 IMPLANT
TROCAR PORT AIRSEAL 5X120 (TROCAR) IMPLANT
TUBING CONNECTING 10 (TUBING) ×2 IMPLANT
TUBING INSUFFLATION 10FT LAP (TUBING) ×1 IMPLANT
UNDERPAD 30X36 HEAVY ABSORB (UNDERPADS AND DIAPERS) ×2 IMPLANT
WATER STERILE IRR 1000ML POUR (IV SOLUTION) ×1 IMPLANT
YANKAUER SUCT BULB TIP 10FT TU (MISCELLANEOUS) IMPLANT

## 2023-04-04 NOTE — Interval H&P Note (Signed)
History and Physical Interval Note:  04/04/2023 10:45 AM  Terri Mckinney  has presented today for surgery, with the diagnosis of RECTAL CANCER.  The various methods of treatment have been discussed with the patient and family. After consideration of risks, benefits and other options for treatment, the patient has consented to  Procedure(s): XI ROBOTIC ASSISTED LOWER ANTERIOR RESECTION (N/A) DIVERTING ILEOSTOMY (N/A) XI ROBOTIC ASSISTED TOTAL HYSTERECTOMY WITH BILATERAL SALPINGO OOPHORECTOMY (N/A) INDOCYANINE GREEN FLUORESCENCE IMAGING (ICG) (N/A) as a surgical intervention.  The patient's history has been reviewed, patient examined, no change in status, stable for surgery.  I have reviewed the patient's chart and labs.  Questions were answered to the patient's satisfaction.     Carver Fila

## 2023-04-04 NOTE — Anesthesia Procedure Notes (Signed)
Procedure Name: Intubation Date/Time: 04/04/2023 11:12 AM  Performed by: Sindy Guadeloupe, CRNAPre-anesthesia Checklist: Patient identified, Emergency Drugs available, Suction available, Patient being monitored and Timeout performed Patient Re-evaluated:Patient Re-evaluated prior to induction Oxygen Delivery Method: Circle system utilized Preoxygenation: Pre-oxygenation with 100% oxygen Induction Type: IV induction Ventilation: Mask ventilation without difficulty Laryngoscope Size: Mac and 4 Grade View: Grade I Tube type: Oral Tube size: 7.0 mm Number of attempts: 1 Airway Equipment and Method: Stylet Placement Confirmation: ETT inserted through vocal cords under direct vision, positive ETCO2 and breath sounds checked- equal and bilateral Secured at: 21 cm Tube secured with: Tape Dental Injury: Teeth and Oropharynx as per pre-operative assessment

## 2023-04-04 NOTE — Op Note (Signed)
Operative Note  Preoperative diagnosis:  1. Colorectal cancer   Postoperative diagnosis: 1.  Colorectal cancer   Procedure(s): 1.  Cystoscopy with bilateral ureteral FireFly injections  Surgeon: Rhoderick Moody, MD  Assistants:  None   Anesthesia:  General  Complications:  None  EBL:  <5 mL  Specimens: 1. None  Drains/Catheters: 1.  16 French Foley  Intraoperative findings:   No intravesical pathology was seen on cystoscopy  Indication:  The patient is a 42 year old female with a history of colorectal cancer requiring a LAR with Dr. Maisie Fus.  Urology has been consulted to performed cystoscopy with bilateral ureteral Fire Fly injection to aide in intraoperative ureteral identification. The patient has been consented for the above procedures, voices understanding and wishes to proceed.  The patient has been consented for the above procedures, voices understanding and wishes to procede  Description of procedure: After informed consent was obtained, the patient was brought to the operating room and general endotracheal anesthesia was administered. The patient was then placed in the dorsolithotomy position and prepped and draped in usual sterile fashion. A timeout was performed. A 21 French rigid cystoscope was then inserted into the urethral meatus and advanced into the bladder under direct vision. A complete bladder survey revealed no intravesical pathology.   A 6 [place] open-ended catheter was then used to intubate the right ureteral orifice and a total of 7.5 mL of firefly solution diluted with 10 mL of saline was injected into the right collecting system. A similar maneuver was then carried out on the left with the same volume and concentration of firefly. The rigid cystoscope was then removed under direct vision. Dr. Maisie Fus then proceeded with their portion of the case  Plan:  Foley removal is at the discretion of the primary team.

## 2023-04-04 NOTE — Interval H&P Note (Signed)
History and Physical Interval Note:  04/04/2023 10:56 AM  Terri Mckinney  has presented today for surgery, with the diagnosis of RECTAL CANCER.  The various methods of treatment have been discussed with the patient and family. After consideration of risks, benefits and other options for treatment, the patient has consented to  Procedure(s): XI ROBOTIC ASSISTED LOWER ANTERIOR RESECTION (N/A) DIVERTING ILEOSTOMY (N/A) XI ROBOTIC ASSISTED TOTAL HYSTERECTOMY WITH BILATERAL SALPINGO OOPHORECTOMY (N/A) INDOCYANINE GREEN FLUORESCENCE IMAGING (ICG) (N/A) as a surgical intervention.  The patient's history has been reviewed, patient examined, no change in status, stable for surgery.  I have reviewed the patient's chart and labs.  Questions were answered to the patient's satisfaction.     Vanita Panda, MD  Colorectal and General Surgery [facility] Surgery

## 2023-04-04 NOTE — Op Note (Signed)
04/04/2023  2:58 PM  PATIENT:  Terri Mckinney  42 y.o. female  Patient Care Team: Junie Spencer, FNP as PCP - General (Family Medicine) Doreatha Massed, MD as Medical Oncologist (Medical Oncology) Therese Sarah, RN as Oncology Nurse Navigator (Medical Oncology)  PRE-OPERATIVE DIAGNOSIS:  RECTAL CANCER   POST-OPERATIVE DIAGNOSIS:  RECTAL CANCER  PROCEDURE:  XI ROBOTIC ASSISTED LOWER ANTERIOR RESECTION DIVERTING ILEOSTOMY XI ROBOTIC ASSISTED TOTAL HYSTERECTOMY WITH BILATERAL SALPINGO OOPHORECTOMY INDOCYANINE GREEN FLUORESCENCE IMAGING (ICG)   Surgeon(s): Romie Levee, MD Carver Fila, MD Andria Meuse, MD Rene Paci, MD  ASSISTANT: Dr Cliffton Asters   ANESTHESIA:   local and general  EBL: Total I/O In: 1400 [I.V.:1300; IV Piggyback:100] Out: 420 [Urine:400; Blood:20]  Delay start of Pharmacological VTE agent (>24hrs) due to surgical blood loss or risk of bleeding:  no  DRAINS: (62F) Jackson-Pratt drain(s) with closed bulb suction in the pelvis    SPECIMEN:  Source of Specimen:  Rectum, Sigmoid, Uterus, Tubes and Ovaries en bloc  DISPOSITION OF SPECIMEN:  PATHOLOGY  COUNTS:  YES  PLAN OF CARE: Admit to inpatient   PATIENT DISPOSITION:  PACU - hemodynamically stable.  INDICATION:    42 y.o. F with stage 4 rectal cancer s/p TNT.  I recommended low anterior resection:  The anatomy & physiology of the digestive tract was discussed.  The pathophysiology was discussed.  Natural history risks without surgery was discussed.   I worked to give an overview of the disease and the frequent need to have multispecialty involvement.  I feel the risks of no intervention will lead to serious problems that outweigh the operative risks; therefore, I recommended a partial colectomy to remove the pathology.  Laparoscopic & open techniques were discussed.   Risks such as bleeding, infection, abscess, leak, reoperation, possible ostomy, hernia, heart  attack, death, and other risks were discussed.  I noted a good likelihood this will help address the problem.   Goals of post-operative recovery were discussed as well.    The patient expressed understanding & wished to proceed with surgery.  OR FINDINGS:   Patient had mid rectal mass with significant scarring from radiation distally  No obvious metastatic disease on visceral parietal peritoneum or liver.  The anastomosis rests 0 cm from the anal verge by rigid proctoscopy.  DESCRIPTION:   Informed consent was confirmed.  The patient underwent general anaesthesia without difficulty.  The patient was positioned appropriately.  VTE prevention in place.  The patient's abdomen was clipped, prepped, & draped in a sterile fashion.  Surgical timeout confirmed our plan.  The patient was positioned in reverse Trendelenburg.  Abdominal entry was gained using a Varies needle in the LUQ.  Entry was clean.  I induced carbon dioxide insufflation.  An 8mm robotic port was placed in the RUQ.  Camera inspection revealed no injury.  Extra ports were carefully placed under direct laparoscopic visualization.  I laparoscopically reflected the greater omentum and the upper abdomen the small bowel in the upper abdomen. The patient was appropriately positioned and the robot was docked to the patient's left side.  Instruments were placed under direct visualization.  Dr. Pricilla Holm began with her portion of the case mobilizing the uterus tubes and ovaries from the anterior pelvis.  When she completed this I began my portion of the procedure.  I mobilized the sigmoid colon off of the pelvic sidewall.  I scored the base of peritoneum of the right side of the mesentery of the left colon  from the ligament of Treitz to the peritoneal reflection of the mid rectum.  The patient had minimal adhesions from her previous surgery.  I elevated the sigmoid mesentery and enetered into the retro-mesenteric plane. We were able to identify the  left ureter and gonadal vessels. We kept those posterior within the retroperitoneum and elevated the left colon mesentery off that. I did isolated IMA pedicle but did not ligate it yet.  I continued distally and got into the avascular plane posterior to the mesorectum. This allowed me to help mobilize the rectum as well by freeing the mesorectum off the sacrum.  I mobilized the peritoneal coverings towards the peritoneal reflection on both the right and left sides of the rectum.  I could see the right and left ureters and stayed away from them.    I skeletonized the inferior mesenteric artery pedicle.  I went down to its takeoff from the aorta.   I isolated the inferior mesenteric vein off of the ligament of Treitz just cephalad to that as well.  After confirming the left ureter was out of the way, I went ahead and ligated the inferior mesenteric artery pedicle with bipolar robotic vessel sealer ~2cm above its takeoff from the aorta.  I did ligate the inferior mesenteric vein in a similar fashion.  We ensured hemostasis.  I then continued my posterior dissection into the pelvis.  I divided the posterior mesorectal plane down to the level of the coccyx and pelvic floor.  There was significant adhesions noted at posterior midline tethering the colon to the pelvic floor.  These were carefully released with robotic scissors.  The lateral attachments were freed on either side.  I then connected my anterior border to her previous dissection using the robotic vessel sealer.  I continued my dissection down to the level of the distal tattoo.  I confirmed that the tattoo was in fact distal to the entire tumor.  I skeletonized the mesorectum at this area using the robotic vessel sealer.  The remaining mesentery of the colon was divided up to the level of the ostomy using the robotic vessel sealer.  The robot was then undocked and the ostomy was taken down using Bovie electrocautery to divide the no cutaneous junction.   Continued my dissection down until the entire loop colostomy was freed from the surrounding tissues.  This was brought out of the abdomen.  There was plenty of length to reach into the pelvis.  The ostomy was divided proximally over a pursestring device.  The remaining mesentery was divided with a Kelly and 2-0 silk sutures.  A 29 mm EEA anvil was placed into the pursestring and this was tied tightly around it.  The remaining fat was cleared in the anvil was placed back into the pelvis.  We attempted to laparoscopically divide the distal rectum with a laparoscopic stapler but I was unable to get a good critical view of the distal rectal structures.  The robot was then redocked.  A green load robotic stapler x 2 was used to transect the distal rectum.  Once the specimen was free we confirmed that the remaining colon will reach into the pelvis.  The 29 mm EEA anvil was attached to the stapler protruding from the distal rectal stump.  There was no tension noted on the anastomosis.  The anastomosis was palpated circumferentially and appeared to be intact.  The anastomosis rest at the anorectal junction.  At this point the robot was redocked.  A portion of  the patient's omentum was mobilized to reach into the pelvis using the robotic vessel sealer.  Dr. Pricilla Holm then closed the vaginal cuff using a running and interrupted Vicryl sutures.  The omentum was tacked to the posterior aspect of the vaginal cuff to allow for a pedicle of healthy tissue between the 2 irradiated closures.  Once this was completed a 33 [place] Blake drain was placed into the pelvis.  The terminal ileum was identified and brought out through the previous left lower quadrant ostomy site.  Three #1 Novafil sutures were used to close down this ostomy site on either edge.  A rubber catheter was inserted to act as an ostomy bridge.  The 32 French drain was placed in the pelvis and brought out through the right lower quadrant and secured into place with a  2-0 nylon suture.  The remaining ports were then removed and the Alexis wound protector was removed as well.  The skin incisions were closed using interrupted 4-0 Vicryl sutures and Dermabond.  The ostomy was then matured in standard Brooke fashion using interrupted 2-0 Vicryl sutures.  An ostomy appliance was placed.  The patient was then awakened from anesthesia and sent to the postanesthesia care unit in stable condition.  All counts were correct per operating room staff.  Vanita Panda, MD  Colorectal and General Surgery [facility] Surgery   An MD assistant was necessary for tissue manipulation, retraction and positioning due to the complexity of the case and hospital policies

## 2023-04-04 NOTE — Anesthesia Postprocedure Evaluation (Signed)
Anesthesia Post Note  Patient: Terri Mckinney  Procedure(s) Performed: XI ROBOTIC ASSISTED LOWER ANTERIOR RESECTION DIVERTING ILEOSTOMY XI ROBOTIC ASSISTED TOTAL HYSTERECTOMY WITH BILATERAL SALPINGO OOPHORECTOMY INDOCYANINE GREEN FLUORESCENCE IMAGING (ICG)     Patient location during evaluation: PACU Anesthesia Type: General Level of consciousness: awake and alert and oriented Pain management: pain level controlled Vital Signs Assessment: post-procedure vital signs reviewed and stable Respiratory status: spontaneous breathing, nonlabored ventilation and respiratory function stable Cardiovascular status: blood pressure returned to baseline and stable Postop Assessment: no apparent nausea or vomiting Anesthetic complications: no   No notable events documented.  Last Vitals:  Vitals:   04/04/23 1645 04/04/23 1650  BP: 122/78   Pulse: 81 77  Resp: 18 15  Temp:    SpO2: 100% 99%    Last Pain:  Vitals:   04/04/23 1643  TempSrc:   PainSc: 6                  Shatonya Passon A.

## 2023-04-04 NOTE — Op Note (Signed)
OPERATIVE NOTE  Pre-operative Diagnosis: Rectal cancer with cervical involvement on imaging  Post-operative Diagnosis: same  Operation: Robotic-assisted laparoscopic total hysterectomy with bilateral salpingo-oophorectomy, omental J-flap to cover vaginal cuff  Surgeon: Eugene Garnet MD  Assistant Surgeon: Angelena Form, MD  Anesthesia: GET  Urine Output: 460 cc  Operative Findings: On EUA, small, mobile uterus.  Normal and somewhat atrophic appearing fallopian tubes and ovaries.  Uterus 6 cm in size and normal in appearance.  Somewhat fuller posterior cervix with rectum adherent to the base of the cervix.  No peritoneal findings.  Estimated Blood Loss:  minimal      Total IV Fluids: see I&O flowsheet         Specimens: uterus, cervix, bilateral tubes and ovaries         Complications:  None apparent; patient tolerated the procedure well.         Disposition: PACU - hemodynamically stable.  Procedure Details  The patient was seen in the Holding Room. The risks, benefits, complications, treatment options, and expected outcomes were discussed with the patient.  The patient concurred with the proposed plan, giving informed consent.  The site of surgery properly noted/marked. The patient was identified as Terri Mckinney and the procedure verified as a Robotic-assisted hysterectomy with bilateral salpingo oophorectomy.   After induction of anesthesia, the patient was draped and prepped in the usual sterile manner.  I was present after the patient underwent cystoscopy with ICG injection into bilateral ureters to prep the vagina.  The vagina was prepped with ChloraPrep.  The urethra was then prepped with Betadine and a Foley catheter was placed.  A surgical timeout was performed with both myself and Dr. Maisie Fus present to confirm the patient and planned procedures.  Dr. Maisie Fus then gained intra-abdominal entry.  Please see her note for further details.  Once the patient was placed in  Trendelenburg and the robot was docked, I started the hysterectomy.  Vaginal assistant placed an EEA sizer within the vagina.  The right and left peritoneum were opened parallel to the IP ligament to open the retroperitoneal spaces bilaterally. The round ligaments were transected. The ureter was noted to be on the medial leaf of the broad ligament.  The peritoneum above the ureter was incised and stretched and the infundibulopelvic ligament was skeletonized, cauterized and cut.    The posterior peritoneum was taken down to the level of the EEA sizer.  The anterior peritoneum was also taken down.  The bladder flap was created to the level of the EEA sizer.  The uterine artery on the right side was skeletonized, cauterized and cut in the normal manner.  A similar procedure was performed on the left.    At this point, the surgery was handed back over to Dr. Maisie Fus for mobilization of the rectum.  Once she had mobilized the rectum posteriorly to below the rectal lesion, attention was turned to making a colpotomy.  Colpotomy was started anteriorly on the EEA sizer.  Once the anterior portion had been made, the EEA sizer was pulled back some and the colpotomy continued circumferentially.  Posteriorly, monopolar electrocautery was used to incise the vagina, just distal to the cervix.  This incision was continued until the rectovaginal septum was identified.  Once the entire colpotomy had being completely made, the rectovaginal septum was further developed down to the level of where the rectal tattoo could be seen.  The case was then handed back over to Dr. Maisie Fus.  After the colonic reanastomosis had  been performed, the colpotomy at the vaginal cuff was closed with 0 Vicryl with a figure-of-eight at each apex and 0 V-Loc to close the midportion of the cuff in a running manner.  Given recent radiation, decision was made to bring down an omental J flap.  Dr. Maisie Fus had already mobilized the omentum some.  I brought  the omentum down to the vagina and a couple of stitches with 0 Vicryl were used to secure the omental flap to the vaginal cuff itself.  The vagina was swabbed with minimal bleeding noted. The vaginal cuff was intact on palpation.  The case was then handed back over to Dr. Maisie Fus.  Eugene Garnet, MD

## 2023-04-04 NOTE — Consult Note (Signed)
Urology Consult   Physician requesting consult: Romie Levee, MD   Reason for consult: Firefly injection   History of Present Illness: Terri Mckinney is a 42 y.o. female undergoing robotic LAR and TAH, BSO with Drs. Maisie Fus and [employer], respectively.  Urology has been consented for ureteral firefly injections.   The patient denies a history of voiding or storage urinary symptoms, hematuria, UTIs, STDs, urolithiasis, GU malignancy/trauma/surgery.  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   Port-A-Cath in place 05/24/2022   upper Rt chest   Tobacco abuse     Past Surgical History:  Procedure Laterality Date   BIOPSY  03/21/2022   Procedure: BIOPSY;  Surgeon: Lanelle Bal, DO;  Location: [facility];  Service: Endoscopy;;   COLONOSCOPY WITH PROPOFOL N/A 03/21/2022   Procedure: COLONOSCOPY WITH PROPOFOL;  Surgeon: Lanelle Bal, DO;  Location: [facility];  Service: Endoscopy;  Laterality: N/A;  8:30am   FLEXIBLE SIGMOIDOSCOPY N/A 05/19/2022   Procedure: FLEXIBLE SIGMOIDOSCOPY WITH TATTOO INJECTION;  Surgeon: Romie Levee, MD;  Location: [facility];  Service: General;  Laterality: N/A;   LAPAROSCOPIC DIVERTED COLOSTOMY N/A 05/19/2022   Procedure: LAPAROSCOPIC DIVERTED OSTOMY;  Surgeon: Romie Levee, MD;  Location: [facility];  Service: General;  Laterality: N/A;   PORTACATH PLACEMENT Right 05/19/2022   Procedure: INSERTION PORT-A-CATH;  Surgeon: Romie Levee, MD;  Location: [facility];  Service: General;  Laterality: Right;    Current Hospital Medications:  Home Meds:  Current Facility-Administered Medications for the 04/04/23 encounter [facility] Encounter)  Medication   medroxyPROGESTERone (DEPO-PROVERA) injection 150 mg   Current Meds  Medication Sig   DENTAGEL 1.1 % GEL dental gel Place 1 Application onto teeth at bedtime.   Multiple Vitamin (MULTIVITAMIN) tablet Take 1 tablet by mouth  daily.   vitamin B-12 (CYANOCOBALAMIN) 250 MCG tablet Take 250 mcg by mouth daily.    Scheduled Meds:  bupivacaine liposome  20 mL Infiltration Once   [START ON 04/05/2023] feeding supplement  296 mL Oral Once   feeding supplement  592 mL Oral Once   scopolamine  1 patch Transdermal Q72H   Continuous Infusions:  cefoTEtan (CEFOTAN) IV     lactated ringers 10 mL/hr at 04/04/23 1035   PRN Meds:.  Allergies:  Allergies  Allergen Reactions   Wellbutrin [Bupropion]     insomnia   Tramadol Rash    Mainly in chest and neck area    Family History  Problem Relation Age of Onset   Heart disease Mother    Diabetes Mother    Hearing loss Father    Heart disease Father    Hyperlipidemia Father    Hypertension Father    Stroke Father    Arthritis Father    Diabetes Father    Learning disabilities Brother    Diabetes Maternal Grandmother    Heart disease Maternal Grandmother    Diabetes Maternal Grandfather    Diabetes Paternal Grandmother    Diabetes Paternal Grandfather    Leukemia Maternal Uncle        d. 76s   Colon cancer Paternal Aunt        dx 95s   Breast cancer Cousin 52   Lung cancer Maternal Aunt     Social History:  reports that she has been smoking cigarettes. She has a 6.75 pack-year smoking history. She has never used smokeless tobacco. She reports that she  does not drink alcohol and does not use drugs.  ROS: A complete review of systems was performed.  All systems are negative except for pertinent findings as noted.  Physical Exam:  Vital signs in last 24 hours: Temp:  [98.9 F (37.2 C)] 98.9 F (37.2 C) (05/01 1016) Pulse Rate:  [97] 97 (05/01 1016) Resp:  [16] 16 (05/01 1016) BP: (124)/(79) 124/79 (05/01 1016) SpO2:  [98 %] 98 % (05/01 1016) Weight:  [75.8 kg] 75.8 kg (05/01 1022) Constitutional:  Alert and oriented, No acute distress Cardiovascular: Regular rate and rhythm, No JVD Respiratory: Normal respiratory effort, Lungs clear bilaterally GI:  Abdomen is soft, nontender, nondistended, no abdominal masses GU: No CVA tenderness Lymphatic: No lymphadenopathy Neurologic: Grossly intact, no focal deficits Psychiatric: Normal mood and affect  Laboratory Data:  No results for input(s): "WBC", "HGB", "HCT", "PLT" in the last 72 hours.  No results for input(s): "NA", "K", "CL", "GLUCOSE", "BUN", "CALCIUM", "CREATININE" in the last 72 hours.  Invalid input(s): "CO3"   Results for orders placed or performed during the hospital encounter of 04/04/23 (from the past 24 hour(s))  Pregnancy, urine POC     Status: None   Collection Time: 04/04/23 10:06 AM  Result Value Ref Range   Preg Test, Ur NEGATIVE NEGATIVE   No results found for this or any previous visit (from the past 240 hour(s)).  Renal Function: No results for input(s): "CREATININE" in the last 168 hours. CrCl cannot be calculated (Patient's most recent lab result is older than the maximum 21 days allowed.).  Radiologic Imaging: No results found.  I independently reviewed the above imaging studies.  Impression/Recommendation 42 y/o female with colorectal cancer involving the cervix and uterus who is undergoing robotic LAR and TAH/BSO  -The risks, benefits and alternatives of cystoscopy with bilateral ureteral firefly injections was discussed with the patient.  She voices understanding and wishes to proceed.   Rhoderick Moody, MD [facility] 04/04/2023, 10:58 AM

## 2023-04-04 NOTE — Transfer of Care (Signed)
Immediate Anesthesia Transfer of Care Note  Patient: Terri Mckinney  Procedure(s) Performed: XI ROBOTIC ASSISTED LOWER ANTERIOR RESECTION DIVERTING ILEOSTOMY XI ROBOTIC ASSISTED TOTAL HYSTERECTOMY WITH BILATERAL SALPINGO OOPHORECTOMY INDOCYANINE GREEN FLUORESCENCE IMAGING (ICG)  Patient Location: PACU  Anesthesia Type:General  Level of Consciousness: awake, alert , oriented, and patient cooperative  Airway & Oxygen Therapy: Patient Spontanous Breathing and Patient connected to face mask oxygen  Post-op Assessment: Report given to RN and Post -op Vital signs reviewed and stable  Post vital signs: Reviewed and stable  Last Vitals:  Vitals Value Taken Time  BP 123/74 04/04/23 1604  Temp    Pulse 93 04/04/23 1606  Resp 13 04/04/23 1606  SpO2 98 % 04/04/23 1606  Vitals shown include unvalidated device data.  Last Pain:  Vitals:   04/04/23 1022  TempSrc:   PainSc: 0-No pain         Complications: No notable events documented.

## 2023-04-04 NOTE — Discharge Instructions (Addendum)
Gynecologic Oncology AFTER SURGERY INSTRUCTIONS   Return to work: 4-6 weeks if applicable  Plan to start using vaginal estrogen along with an estrogen patch starting 1 week from surgery.   Instructions for the cream: Plan to beginning to use vaginal estrogen cream nightly at bedtime for 2 weeks then three times a week after to assist with healing in the vagina. You will need to use a fingertip size amount of cream on your finger and place slightly past the vaginal entrance at bedtime. Do not use the applicator if this comes with the cream.     Activity: 1. Be up and out of the bed during the day.  Take a nap if needed.  You may walk up steps but be careful and use the hand rail.  Stair climbing will tire you more than you think, you may need to stop part way and rest.    2. No lifting or straining for 6 weeks over 10 pounds. No pushing, pulling, straining for 6 weeks.   3. No driving for AROUND 1-2 week(s).  Do not drive if you are taking narcotic pain medicine and make sure that your reaction time has returned.    4. You can shower as soon as the next day after surgery. Shower daily.  Use your regular soap and water (not directly on the incision) and pat your incision(s) dry afterwards; don't rub.  No tub baths or submerging your body in water until cleared by your surgeon. If you have the soap that was given to you by pre-surgical testing that was used before surgery, you do not need to use it afterwards because this can irritate your incisions.    5. No sexual activity and nothing in the vagina for 10-12 weeks.   6. You may experience a small amount of clear drainage from your incisions, which is normal.  If the drainage persists, increases, or changes color please call the office.   7. Do not use creams, lotions, or ointments such as neosporin on your incisions after surgery until advised by your surgeon because they can cause removal of the dermabond glue on your incisions.     8. You may  experience vaginal spotting after surgery or around the 6-8 week mark from surgery when the stitches at the top of the vagina begin to dissolve.  The spotting is normal but if you experience heavy bleeding, call our office.   9. Take Tylenol or ibuprofen first for pain if you are able to take these medications and only use narcotic pain medication for severe pain not relieved by the Tylenol or Ibuprofen.  Monitor your Tylenol intake to a max of 4,000 mg in a 24 hour period. You can alternate these medications after surgery.   Diet: 1. Low sodium Heart Healthy Diet is recommended but you are cleared to resume your normal (before surgery) diet after your procedure.     Wound Care: 1. Keep clean and dry.  Shower daily.   Reasons to call the Doctor: Fever - Oral temperature greater than 100.4 degrees Fahrenheit Foul-smelling vaginal discharge Difficulty urinating Nausea and vomiting Increased pain at the site of the incision that is unrelieved with pain medicine. Difficulty breathing with or without chest pain New calf pain especially if only on one side Sudden, continuing increased vaginal bleeding with or without clots.   Contacts: For questions or concerns you should contact:   Dr. Eugene Garnet at [phone]   [name], NP at [phone]  After Hours: call [phone] and have the GYN Oncologist paged/contacted (after 5 pm or on the weekends). You will speak with an after hours RN and let he or she know you have had surgery.   Messages sent via mychart are for non-urgent matters and are not responded to after hours so for urgent needs, please call the after hours number.

## 2023-04-05 ENCOUNTER — Other Ambulatory Visit (HOSPITAL_COMMUNITY): Payer: Self-pay

## 2023-04-05 ENCOUNTER — Encounter (HOSPITAL_COMMUNITY): Payer: Self-pay | Admitting: General Surgery

## 2023-04-05 ENCOUNTER — Encounter: Payer: Self-pay | Admitting: Hematology

## 2023-04-05 ENCOUNTER — Telehealth (HOSPITAL_COMMUNITY): Payer: Self-pay | Admitting: Pharmacy Technician

## 2023-04-05 LAB — CBC
HCT: 36.1 % (ref 36.0–46.0)
Hemoglobin: 11.8 g/dL — ABNORMAL LOW (ref 12.0–15.0)
MCH: 33.5 pg (ref 26.0–34.0)
MCHC: 32.7 g/dL (ref 30.0–36.0)
MCV: 102.6 fL — ABNORMAL HIGH (ref 80.0–100.0)
Platelets: 219 10*3/uL (ref 150–400)
RBC: 3.52 MIL/uL — ABNORMAL LOW (ref 3.87–5.11)
RDW: 12.8 % (ref 11.5–15.5)
WBC: 9.1 10*3/uL (ref 4.0–10.5)
nRBC: 0 % (ref 0.0–0.2)

## 2023-04-05 LAB — BASIC METABOLIC PANEL
Anion gap: 7 (ref 5–15)
BUN: 7 mg/dL (ref 6–20)
CO2: 22 mmol/L (ref 22–32)
Calcium: 7.6 mg/dL — ABNORMAL LOW (ref 8.9–10.3)
Chloride: 102 mmol/L (ref 98–111)
Creatinine, Ser: 0.85 mg/dL (ref 0.44–1.00)
GFR, Estimated: >60 mL/min (ref >60)
Glucose, Bld: 459 mg/dL — ABNORMAL HIGH (ref 70–99)
Potassium: 5.6 mmol/L — ABNORMAL HIGH (ref 3.5–5.1)
Sodium: 131 mmol/L — ABNORMAL LOW (ref 135–145)

## 2023-04-05 LAB — GLUCOSE, CAPILLARY
Glucose-Capillary: 179 mg/dL — ABNORMAL HIGH (ref 70–99)
Glucose-Capillary: 124 mg/dL — ABNORMAL HIGH (ref 70–99)
Glucose-Capillary: 118 mg/dL — ABNORMAL HIGH (ref 70–99)
Glucose-Capillary: 112 mg/dL — ABNORMAL HIGH (ref 70–99)

## 2023-04-05 MED ORDER — INSULIN ASPART 100 UNIT/ML IJ SOLN: 0.0000 [IU] | Freq: Three times a day (TID) | INTRAMUSCULAR | Status: DC

## 2023-04-05 MED ORDER — INSULIN ASPART 100 UNIT/ML IJ SOLN: 0.0000 [IU] | Freq: Every day | INTRAMUSCULAR | Status: DC

## 2023-04-05 MED ORDER — OXYCODONE HCL 5 MG PO TABS
5.0000 mg | ORAL_TABLET | ORAL | Status: DC | PRN
  Administered 2023-04-05 (×2): 5 mg via ORAL
  Filled 2023-04-05 (×2): qty 1

## 2023-04-05 MED ORDER — SODIUM CHLORIDE 0.9 % IV SOLN: INTRAVENOUS | Status: DC

## 2023-04-05 NOTE — Progress Notes (Signed)
  Transition of Care [facility]) Screening Note   Patient Details  Name: Terri Mckinney Date of Birth: December 31, 1980   Transition [facility]) CM/SW Contact:    Adrian Prows, RN Phone Number: 04/05/2023, 9:33 AM    Transition of Care Department [facility]) has reviewed patient and no TOC needs have been identified at this time. We will continue to monitor patient advancement through interdisciplinary progression rounds. If new patient transition needs arise, please place a TOC consult.

## 2023-04-05 NOTE — Progress Notes (Signed)
1 Day Post-Op Procedure(s) (LRB): XI ROBOTIC ASSISTED LOWER ANTERIOR RESECTION (N/A) DIVERTING ILEOSTOMY (N/A) XI ROBOTIC ASSISTED TOTAL HYSTERECTOMY WITH BILATERAL SALPINGO OOPHORECTOMY (N/A) INDOCYANINE GREEN FLUORESCENCE IMAGING (ICG) (N/A)  Subjective: Patient reports doing well. Pain manageable. Tolerating PO without nausea. + flatus into ileostomy bag.  Objective: Vital signs in last 24 hours: Temp:  [97.6 F (36.4 C)-98.4 F (36.9 C)] 97.9 F (36.6 C) (05/02 0934) Pulse Rate:  [59-103] 103 (05/02 0934) Resp:  [13-24] 18 (05/02 0934) BP: (112-132)/(61-87) 132/61 (05/02 0934) SpO2:  [91 %-100 %] 96 % (05/02 0934) Weight:  [167 lb 8.8 oz (76 kg)] 167 lb 8.8 oz (76 kg) (05/02 0500) Last BM Date : 04/03/23  Intake/Output from previous day: 05/01 0701 - 05/02 0700 In: 2854.6 [P.O.:360; I.V.:2294.6; IV Piggyback:200] Out: 1935 [Urine:1735; Drains:100; Stool:50; Blood:50]  Physical Examination: Gen: NAD, alert and oriented CV: heart rate in 90s, regular rhythm, no murmurs or rubs Pulm: lungs clear to auscultation bilaterally, no wheezes or rhochi Abd: + bowel sounds, nondistended, appropriately tender Ext: warm and well perfused, no edema   Labs:    Latest Ref Rng & Units 04/05/2023    5:06 AM 03/29/2023    2:45 PM 02/09/2023    9:57 AM  CBC  WBC 4.0 - 10.5 K/uL 9.1  6.8  6.5   Hemoglobin 12.0 - 15.0 g/dL 16.1  09.6  04.5   Hematocrit 36.0 - 46.0 % 36.1  41.2  38.1   Platelets 150 - 400 K/uL 219  254  209       Latest Ref Rng & Units 04/05/2023    5:06 AM 02/09/2023    9:57 AM 01/10/2023    1:53 PM  BMP  Glucose 70 - 99 mg/dL 409  88  811   BUN 6 - 20 mg/dL 7  11  11    Creatinine 0.44 - 1.00 mg/dL 9.14  7.82  9.56   Sodium 135 - 145 mmol/L 131  137  139   Potassium 3.5 - 5.1 mmol/L 5.6  4.0  3.7   Chloride 98 - 111 mmol/L 102  106  108   CO2 22 - 32 mmol/L 22  23  23    Calcium 8.9 - 10.3 mg/dL 7.6  9.3  8.7    Assessment:  42 y.o. s/p Procedure(s): XI ROBOTIC  ASSISTED LOWER ANTERIOR RESECTION DIVERTING ILEOSTOMY XI ROBOTIC ASSISTED TOTAL HYSTERECTOMY WITH BILATERAL SALPINGO OOPHORECTOMY INDOCYANINE GREEN FLUORESCENCE IMAGING (ICG): meeting early milestones.  Post-op: continue postop care per primary service. From my standpoint, she would be ready for discharge at any time. Restrictions include no heavy lifting for at least 6 weeks and nothing in the vagina for at least 12 weeks. Discussed importance of glycemic control in the perioperative period.  Surgical menopause: discussed with patient this morning plan to send her home to start vaginal estrogen (for vaginal atrophy and to aide in healing of the vaginal cuff given previous radiation) and ERT with a patch approximately 1 week after surgery. Instructions will be placed in her discharge paperwork and scripts sent in to pharmacy.   Plan: Dispo:  per primary team.   LOS: 1 day    Carver Fila 04/05/2023, 10:28 AM

## 2023-04-05 NOTE — Consult Note (Signed)
WOC Nurse ostomy consult note Stoma type/location: midline, loop ileostomy  Stomal assessment/size: 1 3/8" round, budded, pink, moist Peristomal assessment: NA Treatment options for stomal/peristomal skin: NA Output none Ostomy pouching: 2pc. 2 3/4" in place from OR, patient was using 2 1/4" 2pc at home with colostomy. She feels very comfortable with pouch change   Education provided:  Difference between ileostomy and colostomy, pouch change frequency and timing related to output and lag time. Risk of dehydration in relationship to amount of output.  Will perform pouch change with patient in the am. Discussed loop stoma and red rubber catheter support.  Enrolled patient in [employer] DC program: Yes (4) 2 3/4" skin barriers/pouches and barrier rings left in the room  I will provide ostomy clinic information and ileostomy specific educational materials at my visit in the am.   WOC Nurse will follow along with you for continued support with ostomy teaching and care Kord Monette [facility] MSN, RN, [place], CNS, [place] [phone]

## 2023-04-05 NOTE — TOC Benefit Eligibility Note (Addendum)
Patient Product/process development scientist completed.    The patient is currently admitted and upon discharge could be taking enoxaparin (Lovenox) 40 mg/0.4 ml.  The current 30 day co-pay is $15.00.   The patient is currently admitted and upon discharge could be taking Eliquis 5 mg.  The current 30 day co-pay is $75.00.   The patient is currently admitted and upon discharge could be taking Xarelto 20 mg.  The current 30 day co-pay is $75.00.   The patient is insured through [employer]   This test claim was processed through [employer]- copay amounts may vary at other pharmacies due to pharmacy/plan contracts, or as the patient moves through the different stages of their insurance plan.  Terri Mckinney, CPHT Pharmacy Patient Advocate Specialist [facility] Patient Advocate Team Direct Number: [phone]  Fax: [phone]

## 2023-04-05 NOTE — Telephone Encounter (Signed)
Pharmacy Patient Advocate Encounter  Insurance verification completed.    The patient is insured through [employer]   The patient is currently admitted and ran test claims for the following: Enoxaparin, Eliquis, Xarelto.  Copays and coinsurance results were relayed to Inpatient clinical team.

## 2023-04-05 NOTE — Progress Notes (Signed)
1 Day Post-Op Robotic LAR w/ DLI, TAH/BSO Subjective: Doing well, feels sore, min bowel activity.    Objective: Vital signs in last 24 hours: Temp:  [97.6 F (36.4 C)-98.9 F (37.2 C)] 98.4 F (36.9 C) (05/02 0600) Pulse Rate:  [59-98] 95 (05/02 0600) Resp:  [13-24] 16 (05/02 0600) BP: (112-126)/(67-87) 120/70 (05/02 0600) SpO2:  [91 %-100 %] 99 % (05/02 0600) Weight:  [75.8 kg-76 kg] 76 kg (05/02 0500)   Intake/Output from previous day: 05/01 0701 - 05/02 0700 In: 2854.6 [P.O.:360; I.V.:2294.6; IV Piggyback:200] Out: 1935 [Urine:1735; Drains:100; Stool:50; Blood:50] Intake/Output this shift: No intake/output data recorded.   General appearance: alert and cooperative GI: soft, non-tender; bowel sounds normal; no masses,  no organomegaly  Incision: no significant drainage  Lab Results:  Recent Labs    04/05/23 0506  WBC 9.1  HGB 11.8*  HCT 36.1  PLT 219   BMET Recent Labs    04/05/23 0506  NA 131*  K 5.6*  CL 102  CO2 22  GLUCOSE 459*  BUN 7  CREATININE 0.85  CALCIUM 7.6*   PT/INR No results for input(s): "LABPROT", "INR" in the last 72 hours. ABG No results for input(s): "PHART", "HCO3" in the last 72 hours.  Invalid input(s): "PCO2", "PO2"  MEDS, Scheduled  acetaminophen  1,000 mg Oral Q6H   alvimopan  12 mg Oral BID   enoxaparin (LOVENOX) injection  40 mg Subcutaneous Q24H   feeding supplement  237 mL Oral BID BM   gabapentin  300 mg Oral BID   insulin aspart  0-20 Units Subcutaneous TID WC   insulin aspart  0-5 Units Subcutaneous QHS   saccharomyces boulardii  250 mg Oral BID    Studies/Results: No results found.  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  Will monitor with correction of BG, cont IVF's Ambulate in hall IV/PO pain meds as needed Cont foley today Ostomy teaching.  Pt used to ostomy but may need help transitioning to ileostomy    LOS: 1 day     .Vanita Panda, MD [facility] Surgery, [place]    04/05/2023 7:29 AM

## 2023-04-06 ENCOUNTER — Other Ambulatory Visit (HOSPITAL_COMMUNITY): Payer: Self-pay

## 2023-04-06 LAB — CBC
HCT: 42.8 % (ref 36.0–46.0)
Hemoglobin: 14.1 g/dL (ref 12.0–15.0)
MCH: 33.3 pg (ref 26.0–34.0)
MCHC: 32.9 g/dL (ref 30.0–36.0)
MCV: 101.2 fL — ABNORMAL HIGH (ref 80.0–100.0)
Platelets: 234 10*3/uL (ref 150–400)
RBC: 4.23 MIL/uL (ref 3.87–5.11)
RDW: 12.7 % (ref 11.5–15.5)
WBC: 9.7 10*3/uL (ref 4.0–10.5)
nRBC: 0 % (ref 0.0–0.2)

## 2023-04-06 LAB — GLUCOSE, CAPILLARY
Glucose-Capillary: 101 mg/dL — ABNORMAL HIGH (ref 70–99)
Glucose-Capillary: 99 mg/dL (ref 70–99)
Glucose-Capillary: 105 mg/dL — ABNORMAL HIGH (ref 70–99)
Glucose-Capillary: 105 mg/dL — ABNORMAL HIGH (ref 70–99)

## 2023-04-06 LAB — BASIC METABOLIC PANEL
Anion gap: 7 (ref 5–15)
BUN: 8 mg/dL (ref 6–20)
CO2: 21 mmol/L — ABNORMAL LOW (ref 22–32)
Calcium: 7.8 mg/dL — ABNORMAL LOW (ref 8.9–10.3)
Chloride: 108 mmol/L (ref 98–111)
Creatinine, Ser: 0.7 mg/dL (ref 0.44–1.00)
GFR, Estimated: >60 mL/min (ref >60)
Glucose, Bld: 112 mg/dL — ABNORMAL HIGH (ref 70–99)
Potassium: 4.1 mmol/L (ref 3.5–5.1)
Sodium: 136 mmol/L (ref 135–145)

## 2023-04-06 LAB — HEMOGLOBIN A1C
Hgb A1c MFr Bld: 5.3 % (ref 4.8–5.6)
Mean Plasma Glucose: 105.41 mg/dL

## 2023-04-06 MED ORDER — ESTRADIOL 0.1 MG/GM VA CREA
TOPICAL_CREAM | VAGINAL | 3 refills | Status: DC
  Filled 2023-04-06: qty 42.5, 30d supply, fill #0

## 2023-04-06 MED ORDER — ESTRADIOL 0.1 MG/24HR TD PTWK
0.1000 mg | MEDICATED_PATCH | TRANSDERMAL | 6 refills | Status: DC
  Filled 2023-04-06: qty 4, 28d supply, fill #0

## 2023-04-06 NOTE — Progress Notes (Signed)
2 Days Post-Op Robotic LAR w/ DLI, TAH/BSO Subjective: Doing well, feels sore, pain meds controlling her pain, having ostomy output.  Ambulating  Objective: Vital signs in last 24 hours: Temp:  [97.9 F (36.6 C)-98.9 F (37.2 C)] 98 F (36.7 C) (05/03 0536) Pulse Rate:  [103-110] 110 (05/03 0536) Resp:  [18] 18 (05/03 0536) BP: (128-143)/(61-83) 128/83 (05/03 0536) SpO2:  [94 %-98 %] 94 % (05/03 0536)   Intake/Output from previous day: 05/02 0701 - 05/03 0700 In: 480 [P.O.:480] Out: 2410 [Urine:1700; Drains:510; Stool:200] Intake/Output this shift: No intake/output data recorded.   General appearance: alert and cooperative GI: soft, non-tender; bowel sounds normal; no masses,  no organomegaly  Incision: no significant drainage  Lab Results:  Recent Labs    04/05/23 0506 04/06/23 0530  WBC 9.1 9.7  HGB 11.8* 14.1  HCT 36.1 42.8  PLT 219 234    BMET Recent Labs    04/05/23 0506 04/06/23 0530  NA 131* 136  K 5.6* 4.1  CL 102 108  CO2 22 21*  GLUCOSE 459* 112*  BUN 7 8  CREATININE 0.85 0.70  CALCIUM 7.6* 7.8*    PT/INR No results for input(s): "LABPROT", "INR" in the last 72 hours. ABG No results for input(s): "PHART", "HCO3" in the last 72 hours.  Invalid input(s): "PCO2", "PO2"  MEDS, Scheduled  acetaminophen  1,000 mg Oral Q6H   alvimopan  12 mg Oral BID   enoxaparin (LOVENOX) injection  40 mg Subcutaneous Q24H   feeding supplement  237 mL Oral BID BM   gabapentin  300 mg Oral BID   insulin aspart  0-20 Units Subcutaneous TID WC   insulin aspart  0-5 Units Subcutaneous QHS   saccharomyces boulardii  250 mg Oral BID    Studies/Results: No results found.  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  Resolved with glucose control Ambulate in hall IV/PO pain meds as needed D/c foley today Ostomy teaching.  Pt used to ostomy but may need help transitioning to ileostomy  Given recent pelvic surgery, cancer diagnosis and new estrogen replacement, I recommend continued dvt prophylaxis for 30 days after surgery.  Working with pharmacy to get this set up at d/c     LOS: 2 days   .Vanita Panda, MD [facility], [place]    04/06/2023 7:42 AM

## 2023-04-06 NOTE — Progress Notes (Signed)
2 Days Post-Op Procedure(s) (LRB): XI ROBOTIC ASSISTED LOWER ANTERIOR RESECTION (N/A) DIVERTING ILEOSTOMY (N/A) XI ROBOTIC ASSISTED TOTAL HYSTERECTOMY WITH BILATERAL SALPINGO OOPHORECTOMY (N/A) INDOCYANINE GREEN FLUORESCENCE IMAGING (ICG) (N/A)  Subjective: Patient reports doing well. Pain manageable. Tolerating PO without nausea. + flatus into ileostomy bag.   Objective: Vital signs in last 24 hours: Temp:  [97.9 F (36.6 C)-98.9 F (37.2 C)] 98 F (36.7 C) (05/03 0536) Pulse Rate:  [103-110] 110 (05/03 0536) Resp:  [18] 18 (05/03 0536) BP: (128-143)/(61-83) 128/83 (05/03 0536) SpO2:  [94 %-98 %] 94 % (05/03 0536) Last BM Date : 04/03/23  Intake/Output from previous day: 05/02 0701 - 05/03 0700 In: 480 [P.O.:480] Out: 2410 [Urine:1700; Drains:510; Stool:200]  Physical Examination (performed by Dr. Pricilla Holm): Gen: NAD, alert and oriented CV: heart rate in low 100s, regular rhythm, no murmurs or rubs Pulm: lungs clear to auscultation bilaterally, no wheezes or rhochi Abd: + bowel sounds, slightly more distended than 5/2 assessment, appropriately tender Ext: warm and well perfused, no edema  44 cc of serosanguinous drainage emptied from JP drain. Drain stripped. Abdominal dressing dry and intact.   Labs:    Latest Ref Rng & Units 04/06/2023    5:30 AM 04/05/2023    5:06 AM 03/29/2023    2:45 PM  CBC  WBC 4.0 - 10.5 K/uL 9.7  9.1  6.8   Hemoglobin 12.0 - 15.0 g/dL 16.1  09.6  04.5   Hematocrit 36.0 - 46.0 % 42.8  36.1  41.2   Platelets 150 - 400 K/uL 234  219  254       Latest Ref Rng & Units 04/06/2023    5:30 AM 04/05/2023    5:06 AM 02/09/2023    9:57 AM  BMP  Glucose 70 - 99 mg/dL 409  811  88   BUN 6 - 20 mg/dL 8  7  11    Creatinine 0.44 - 1.00 mg/dL 9.14  7.82  9.56   Sodium 135 - 145 mmol/L 136  131  137   Potassium 3.5 - 5.1 mmol/L 4.1  5.6  4.0   Chloride 98 - 111 mmol/L 108  102  106   CO2 22 - 32 mmol/L 21  22  23    Calcium 8.9 - 10.3 mg/dL 7.8  7.6  9.3     Assessment:  42 y.o. s/p Procedure(s): XI ROBOTIC ASSISTED LOWER ANTERIOR RESECTION DIVERTING ILEOSTOMY XI ROBOTIC ASSISTED TOTAL HYSTERECTOMY WITH BILATERAL SALPINGO OOPHORECTOMY INDOCYANINE GREEN FLUORESCENCE IMAGING (ICG): meeting early milestones.  Post-op: Continue postop care per primary service. From my standpoint, she would be ready for discharge at any time. Restrictions include no heavy lifting for at least 6 weeks and nothing in the vagina for at least 12 weeks.  Surgical menopause: Reiterated the recommendations to start vaginal estrogen (for vaginal atrophy and to aide in healing of the vaginal cuff given previous radiation) and ERT with a patch approximately 1 week after surgery. Instructions will be placed in her discharge paperwork and scripts sent in to pharmacy.   Plan: Dispo:  per primary team.   LOS: 2 days    Doylene Bode 04/06/2023, 8:19 AM

## 2023-04-06 NOTE — TOC Initial Note (Signed)
Transition of Care [facility]) - Initial/Assessment Note   Patient Details  Name: Terri Mckinney MRN: [id number] Date of Birth: 01-23-1981  Transition of Care [facility]) CM/SW Contact:    Ewing Schlein, LCSW Phone Number: 04/06/2023, 12:01 PM  Clinical Narrative: Patient has a new ostomy and will need HHRN set up. Patient is agreeable to referral to [facility] agencies. CSW made [facility] referral to [facility] with [facility], which was accepted. CSW updated patient and patient had questions on whether she will need HHRN or not after discharge. CSW explained why HHRN is set up after a new ostomy.                Expected Discharge Plan: Home w Home Health Services Barriers to Discharge: Continued Medical Work up  Patient Goals and CMS Choice Patient states their goals for this hospitalization and ongoing recovery are:: Return home CMS Medicare.gov Compare Post Acute Care list provided to:: Patient Choice offered to / list presented to : Patient  Expected Discharge Plan and Services In-house Referral: Clinical Social Work Post Acute Care Choice: Home Health Living arrangements for the past 2 months: Single Family Home          DME Arranged: N/A DME Agency: NA HH Arranged: RN HH Agency: [facility] Date HH Agency Contacted: 04/06/23 Representative spoke with at [facility] Agency: Morrie Sheldon  Prior Living Arrangements/Services Living arrangements for the past 2 months: Single Family Home Patient language and need for interpreter reviewed:: Yes Do you feel safe going back to the place where you live?: Yes      Need for Family Participation in Patient Care: No (Comment) Care giver support system in place?: Yes (comment) Criminal Activity/Legal Involvement Pertinent to Current Situation/Hospitalization: No - Comment as needed  Activities of Daily Living Home Assistive Devices/Equipment: Ostomy supplies ADL Screening (condition at time of admission) Patient's cognitive ability adequate to safely  complete daily activities?: Yes Is the patient deaf or have difficulty hearing?: No Does the patient have difficulty seeing, even when wearing glasses/contacts?: No Does the patient have difficulty concentrating, remembering, or making decisions?: No Patient able to express need for assistance with ADLs?: Yes Does the patient have difficulty dressing or bathing?: No Independently performs ADLs?: Yes (appropriate for developmental age) Does the patient have difficulty walking or climbing stairs?: No Weakness of Legs: None Weakness of Arms/Hands: None  Permission Sought/Granted Permission sought to share information with : Other (comment) Permission granted to share information with : Yes, Verbal Permission Granted Permission granted to share info w AGENCY: HH agencies  Emotional Assessment Appearance:: Appears stated age Attitude/Demeanor/Rapport: Engaged Affect (typically observed): Accepting Orientation: : Oriented to Self, Oriented to Place, Oriented to  Time, Oriented to Situation Alcohol / Substance Use: Not Applicable Psych Involvement: No (comment)  Admission diagnosis:  Rectal cancer metastasized to liver (HCC) [C20, C78.7] Patient Active Problem List   Diagnosis Date Noted   Cervical mass 04/04/2023   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 [place] [address] Phone: [phone] Fax: [phone]  [facility] [place], [place] - [address] [place] [address] [place] [address] Phone: [phone] Fax: [phone]  Social Determinants of Health (SDOH) Social History: SDOH  Screenings   Food Insecurity: No Food Insecurity (04/04/2023)  Housing: Low Risk  (04/04/2023)  Transportation Needs: No Transportation Needs (04/04/2023)  Utilities: Not At Risk (04/04/2023)  Depression (PHQ2-9): Low Risk  (03/06/2023)  Tobacco Use: High Risk (04/05/2023)   SDOH Interventions: Food Insecurity Interventions: Intervention Not Indicated Housing Interventions: Intervention Not Indicated Transportation Interventions: Intervention Not Indicated Utilities Interventions: Intervention Not Indicated  Readmission Risk Interventions     No data to display

## 2023-04-06 NOTE — Consult Note (Addendum)
WOC Nurse ostomy follow up Stoma type/location: midline  Stomal assessment/size: 1 3/8" round, pink, moist, budded. Red rubber support under stoma Peristomal assessment: MARSI at distal aspect of the tape border medially and laterally, ruptured blisters  Treatment options for stomal/peristomal skin: no sting skin barrier wipes/silcone foam placed after skin barrier placed. I was able to reposition skin barrier to avoid having tape on affected areas  Output liquid green, 300CC Ostomy pouching: 2pc. 2 3/4" with 2" ostomy barrier ring Education provided:  Ileostomy educational handouts provided regarding diet, blockage risk and dehydration  Discussed food blockage, preventing dehydration, skin care for new onset MARSI Enrolled patient in [employer] Discharge program: Yes, however I notified SS today of the conversion to loop ileostomy   3 pouching systems and barrier rings in the room for DC.  Ordered 4 additional sets per [place].   WOC Nurse will follow along with you for continued support with ostomy teaching and care Terri Mckinney [facility], RN, [place], CNS, [place] [phone]     1241: orders to take red rubber support out per Dr. Maisie Fus, returned to the room to remove. Patient tolerated without difficulty.   Davina Poke

## 2023-04-07 ENCOUNTER — Inpatient Hospital Stay (HOSPITAL_COMMUNITY): Payer: BC Managed Care – PPO

## 2023-04-07 DIAGNOSIS — Z932 Ileostomy status: Secondary | ICD-10-CM

## 2023-04-07 LAB — BASIC METABOLIC PANEL
Anion gap: 11 (ref 5–15)
BUN: 11 mg/dL (ref 6–20)
CO2: 21 mmol/L — ABNORMAL LOW (ref 22–32)
Calcium: 8.5 mg/dL — ABNORMAL LOW (ref 8.9–10.3)
Chloride: 103 mmol/L (ref 98–111)
Creatinine, Ser: 0.86 mg/dL (ref 0.44–1.00)
GFR, Estimated: >60 mL/min (ref >60)
Glucose, Bld: 107 mg/dL — ABNORMAL HIGH (ref 70–99)
Potassium: 4.1 mmol/L (ref 3.5–5.1)
Sodium: 135 mmol/L (ref 135–145)

## 2023-04-07 LAB — CBC
HCT: 45.5 % (ref 36.0–46.0)
Hemoglobin: 15.2 g/dL — ABNORMAL HIGH (ref 12.0–15.0)
MCH: 33.6 pg (ref 26.0–34.0)
MCHC: 33.4 g/dL (ref 30.0–36.0)
MCV: 100.4 fL — ABNORMAL HIGH (ref 80.0–100.0)
Platelets: 277 10*3/uL (ref 150–400)
RBC: 4.53 MIL/uL (ref 3.87–5.11)
RDW: 12.7 % (ref 11.5–15.5)
WBC: 10 10*3/uL (ref 4.0–10.5)
nRBC: 0 % (ref 0.0–0.2)

## 2023-04-07 MED ORDER — PROCHLORPERAZINE EDISYLATE 10 MG/2ML IJ SOLN
5.0000 mg | INTRAMUSCULAR | Status: DC | PRN
  Administered 2023-04-07 – 2023-04-08 (×2): 10 mg via INTRAVENOUS
  Filled 2023-04-07 (×2): qty 2

## 2023-04-07 MED ORDER — LACTATED RINGERS IV BOLUS
1000.0000 mL | Freq: Once | INTRAVENOUS | Status: AC
  Administered 2023-04-07: 1000 mL via INTRAVENOUS

## 2023-04-07 MED ORDER — SODIUM CHLORIDE 0.9 % IV SOLN: 250.0000 mL | INTRAVENOUS | Status: DC | PRN

## 2023-04-07 MED ORDER — SODIUM CHLORIDE 0.9% FLUSH: 3.0000 mL | INTRAVENOUS | Status: DC | PRN

## 2023-04-07 MED ORDER — SODIUM CHLORIDE 0.9 % IV SOLN: 8.0000 mg | Freq: Four times a day (QID) | INTRAVENOUS | Status: DC | PRN

## 2023-04-07 MED ORDER — DIPHENHYDRAMINE HCL 50 MG/ML IJ SOLN: 12.5000 mg | Freq: Four times a day (QID) | INTRAMUSCULAR | Status: DC | PRN

## 2023-04-07 MED ORDER — SIMETHICONE 80 MG PO CHEW
80.0000 mg | CHEWABLE_TABLET | Freq: Four times a day (QID) | ORAL | Status: DC
  Administered 2023-04-07 (×2): 80 mg via ORAL
  Filled 2023-04-07 (×2): qty 1

## 2023-04-07 MED ORDER — METOPROLOL TARTRATE 5 MG/5ML IV SOLN
5.0000 mg | Freq: Four times a day (QID) | INTRAVENOUS | Status: DC | PRN
  Administered 2023-04-09 – 2023-04-11 (×3): 5 mg via INTRAVENOUS
  Filled 2023-04-07 (×3): qty 5

## 2023-04-07 MED ORDER — FERROUS SULFATE 325 (65 FE) MG PO TABS: 325.0000 mg | ORAL_TABLET | Freq: Two times a day (BID) | ORAL | Status: DC

## 2023-04-07 MED ORDER — MAGIC MOUTHWASH: 15.0000 mL | Freq: Four times a day (QID) | ORAL | Status: DC | PRN

## 2023-04-07 MED ORDER — ALUM & MAG HYDROXIDE-SIMETH 200-200-20 MG/5ML PO SUSP
30.0000 mL | Freq: Four times a day (QID) | ORAL | Status: DC | PRN
  Administered 2023-04-10 – 2023-04-11 (×2): 30 mL via ORAL
  Filled 2023-04-07 (×2): qty 30

## 2023-04-07 MED ORDER — ADULT MULTIVITAMIN W/MINERALS CH
1.0000 | ORAL_TABLET | Freq: Every day | ORAL | Status: DC
  Administered 2023-04-07: 1 via ORAL
  Filled 2023-04-07: qty 1

## 2023-04-07 MED ORDER — SODIUM CHLORIDE 0.9% FLUSH
3.0000 mL | Freq: Two times a day (BID) | INTRAVENOUS | Status: DC
  Administered 2023-04-07 – 2023-04-09 (×4): 3 mL via INTRAVENOUS

## 2023-04-07 MED ORDER — HYDROMORPHONE HCL 1 MG/ML IJ SOLN
0.5000 mg | INTRAMUSCULAR | Status: DC | PRN
  Administered 2023-04-08: 1 mg via INTRAVENOUS
  Filled 2023-04-07: qty 1

## 2023-04-07 MED ORDER — LACTATED RINGERS IV BOLUS: 1000.0000 mL | Freq: Three times a day (TID) | INTRAVENOUS | Status: AC | PRN

## 2023-04-07 MED ORDER — METOCLOPRAMIDE HCL 5 MG/ML IJ SOLN
5.0000 mg | Freq: Three times a day (TID) | INTRAMUSCULAR | Status: DC | PRN
  Administered 2023-04-07 – 2023-04-11 (×2): 10 mg via INTRAVENOUS
  Filled 2023-04-07 (×3): qty 2

## 2023-04-07 MED ORDER — CALCIUM POLYCARBOPHIL 625 MG PO TABS
625.0000 mg | ORAL_TABLET | Freq: Two times a day (BID) | ORAL | Status: DC
  Administered 2023-04-07: 625 mg via ORAL
  Filled 2023-04-07: qty 1

## 2023-04-07 MED ORDER — SIMETHICONE 40 MG/0.6ML PO SUSP
80.0000 mg | Freq: Four times a day (QID) | ORAL | Status: DC | PRN
  Administered 2023-04-09 – 2023-04-11 (×3): 80 mg via ORAL
  Filled 2023-04-07 (×6): qty 1.2

## 2023-04-07 MED ORDER — MENTHOL 3 MG MT LOZG: 1.0000 | LOZENGE | OROMUCOSAL | Status: DC | PRN

## 2023-04-07 MED ORDER — LIP MEDEX EX OINT
TOPICAL_OINTMENT | Freq: Two times a day (BID) | CUTANEOUS | Status: DC
  Administered 2023-04-07: 75 via TOPICAL
  Administered 2023-04-07: 1 via TOPICAL
  Administered 2023-04-08 – 2023-04-09 (×2): 75 via TOPICAL
  Administered 2023-04-10 – 2023-04-11 (×2): 1 via TOPICAL
  Filled 2023-04-07 (×2): qty 7

## 2023-04-07 MED ORDER — ONDANSETRON HCL 4 MG/2ML IJ SOLN: 4.0000 mg | Freq: Four times a day (QID) | INTRAMUSCULAR | Status: DC | PRN

## 2023-04-07 MED ORDER — METHOCARBAMOL 500 MG PO TABS: 1000.0000 mg | ORAL_TABLET | Freq: Four times a day (QID) | ORAL | Status: DC | PRN

## 2023-04-07 MED ORDER — PHENOL 1.4 % MT LIQD
2.0000 | OROMUCOSAL | Status: DC | PRN
  Administered 2023-04-07: 2 via OROMUCOSAL
  Filled 2023-04-07: qty 177

## 2023-04-07 MED ORDER — METHOCARBAMOL 1000 MG/10ML IJ SOLN
1000.0000 mg | Freq: Four times a day (QID) | INTRAVENOUS | Status: DC | PRN
  Administered 2023-04-07: 1000 mg via INTRAVENOUS
  Filled 2023-04-07: qty 1000

## 2023-04-07 MED ORDER — CYANOCOBALAMIN 500 MCG PO TABS
250.0000 ug | ORAL_TABLET | Freq: Every day | ORAL | Status: DC
  Administered 2023-04-07: 250 ug via ORAL
  Filled 2023-04-07: qty 1

## 2023-04-07 NOTE — Progress Notes (Signed)
3 Days Post-Op Procedure(s) (LRB): XI ROBOTIC ASSISTED LOWER ANTERIOR RESECTION (N/A) DIVERTING ILEOSTOMY (N/A) XI ROBOTIC ASSISTED TOTAL HYSTERECTOMY WITH BILATERAL SALPINGO OOPHORECTOMY (N/A) INDOCYANINE GREEN FLUORESCENCE IMAGING (ICG) (N/A)  Subjective: Pt reports that she experienced heart burn yesterday which was then followed by emesis. She notes that even though she was changed to a regular diet yesterday she didn't eat anything off that menu due to lack of diet before her episode of emesis. She reports that she is voiding at what she considers her baseline. She has done some ambulation. Pt overall controlled.   Objective: Vital signs in last 24 hours: Temp:  [97.2 F (36.2 C)-98.5 F (36.9 C)] 97.2 F (36.2 C) (05/04 1151) Pulse Rate:  [101-120] 120 (05/04 1559) Resp:  [16-18] 16 (05/04 0324) BP: (126-143)/(84-94) 135/87 (05/04 1559) SpO2:  [97 %-99 %] 97 % (05/04 1559) Weight:  [167 lb 5.3 oz (75.9 kg)] 167 lb 5.3 oz (75.9 kg) (05/04 0500) Last BM Date : 04/06/23  Intake/Output from previous day: 05/03 0701 - 05/04 0700 In: 3352 [P.O.:480; I.V.:2872] Out: 2555 [Urine:500; Emesis/NG output:500; Drains:1005; Stool:550]  Physical Examination: Gen: NAD, alert and oriented CV: regular rate and rhythm, no murmurs or rubs Pulm: lungs clear to auscultation bilaterally, no wheezes or rhochi Abd: + bowel sounds, appropriately tender, small liquid to soft stool in ostomy bag. Serosanguinous drainage in JP drain. Abdominal dressing dry and intact. Ext: warm and well perfused, no edema    Labs:    Latest Ref Rng & Units 04/07/2023    7:09 AM 04/06/2023    5:30 AM 04/05/2023    5:06 AM  CBC  WBC 4.0 - 10.5 K/uL 10.0  9.7  9.1   Hemoglobin 12.0 - 15.0 g/dL 54.0  98.1  19.1   Hematocrit 36.0 - 46.0 % 45.5  42.8  36.1   Platelets 150 - 400 K/uL 277  234  219       Latest Ref Rng & Units 04/07/2023    7:09 AM 04/06/2023    5:30 AM 04/05/2023    5:06 AM  BMP  Glucose 70 - 99 mg/dL 478   295  621   BUN 6 - 20 mg/dL 11  8  7    Creatinine 0.44 - 1.00 mg/dL 3.08  6.57  8.46   Sodium 135 - 145 mmol/L 135  136  131   Potassium 3.5 - 5.1 mmol/L 4.1  4.1  5.6   Chloride 98 - 111 mmol/L 103  108  102   CO2 22 - 32 mmol/L 21  21  22    Calcium 8.9 - 10.3 mg/dL 8.5  7.8  7.6    Assessment/Recommendations  42 y.o. s/p Procedure(s): XI ROBOTIC ASSISTED LOWER ANTERIOR RESECTION DIVERTING ILEOSTOMY XI ROBOTIC ASSISTED TOTAL HYSTERECTOMY WITH BILATERAL SALPINGO OOPHORECTOMY INDOCYANINE GREEN FLUORESCENCE IMAGING (ICG): with episode of emesis yesterday.  Post-op: Continue postop care per primary service. Agree with backing down to liquid diet due to emesis.  - Postop restrictions include no heavy lifting for at least 6 weeks and nothing in the vagina for at least 12 weeks.  Surgical menopause: Reiterated the recommendations to start vaginal estrogen (for vaginal atrophy and to aide in healing of the vaginal cuff given previous radiation) and ERT with a patch approximately 1 week after surgery. Instructions will be placed in her discharge paperwork and scripts sent in to pharmacy.     LOS: 3 days    Sharonne Ricketts 04/07/2023, 4:00 PM

## 2023-04-07 NOTE — Progress Notes (Signed)
Terri Mckinney [id number] 01-13-1981  CARE TEAM:  PCP: Junie Spencer, FNP  Outpatient Care Team: Patient Care Team: Junie Spencer, FNP as PCP - General (Family Medicine) Doreatha Massed, MD as Medical Oncologist (Medical Oncology) Therese Sarah, RN as Oncology Nurse Navigator (Medical Oncology)  Inpatient Treatment Team: Treatment Team: Attending Provider: Romie Levee, MD; WOC Nurse: McNichol, Bonney Aid, RN; Nurse Practitioner: Doylene Bode, NP; WOC Nurse: Vassie Loll, RN; Licensed Practical Nurse: Wright, [place] E, LPN; Respiratory Therapist: Nunzio Cobbs, RRT; Utilization Review: Deveron Furlong, RN; Pharmacist: Herby Abraham, [facility]; Case Manager: Redmond Baseman, RN   Problem List:   Principal Problem:   Rectal cancer metastasized to liver [facility]) Active Problems:   Cervical mass   3 Days Post-Op  04/04/2023  POST-OPERATIVE DIAGNOSIS:  RECTAL CANCER   PROCEDURE:   XI ROBOTIC ASSISTED LOWER ANTERIOR RESECTION DIVERTING ILEOSTOMY INDOCYANINE GREEN FLUORESCENCE IMAGING (ICG)  Surgeon(s): Romie Levee, MD  OR FINDINGS:    Patient had mid rectal mass with significant scarring from radiation distally   No obvious metastatic disease on visceral parietal peritoneum or liver.   The anastomosis rests 0 cm from the anal verge by rigid proctoscopy.    OPERATIVE NOTE  Post-operative Diagnosis: Rectal cancer with cervical involvement on imaging   Operation: Robotic-assisted laparoscopic total hysterectomy with bilateral salpingo-oophorectomy, omental J-flap to cover vaginal cuff   Surgeon: Eugene Garnet MD  Operative Findings:  On EUA, small, mobile uterus.  Normal and somewhat atrophic appearing fallopian tubes and ovaries.  Uterus 6 cm in size and normal in appearance.  Somewhat fuller posterior cervix with rectum adherent to the base of the cervix.  No peritoneal findings.      Assessment  Slowly recovering  [facility] Stay = 3  days)  Plan:  With emesis documented, back down to full liquid/dysphagia 1 diet today and regroup.  Nausea control.  ERAS protocol  Multimodal pain control   Follow glucose.  Seems to be improved.  A1c normal argues against chronic diabetes.  Follow-up on pathology  Follow ileostomy output.  Ostomy care and teaching.  Used to colostomy but may have more high output issues with the loop ileostomy.  We will see  -VTE prophylaxis- SCDs, etc. most likely will need continued aggressive anticoagulation 30 days postop to decrease risk of DVT  -mobilize as tolerated to help recovery  Disposition: TBD -was likely a few more days before considering discharge       I reviewed nursing notes, Consultant GYN ONC, WOCN notes, last 24 h vitals and pain scores, last 48 h intake and output, last 24 h labs and trends, and last 24 h imaging results. I have reviewed this patient's available data, including medical history, events of note, test results, etc as part of my evaluation.  A significant portion of that time was spent in counseling.  Care during the described time interval was provided by me.  This care required moderate level of medical decision making.  04/07/2023    Subjective: (Chief complaint)  Emesis documented.  Sitting up in chair trying to walk.  Denies too much pain.  Objective:  Vital signs:  Vitals:   04/06/23 2334 04/07/23 0324 04/07/23 0500 04/07/23 0728  BP: 135/84 (!) 137/92  (!) 143/91  Pulse: (!) 104 (!) 118  (!) 102  Resp: 17 16    Temp: 98.4 F (36.9 C) 97.6 F (36.4 C)    TempSrc: Oral Oral    SpO2: 99% 97%  99%  Weight:   75.9 kg   Height:        Last BM Date : 04/06/23  Intake/Output   Yesterday:  05/03 0701 - 05/04 0700 In: 3352 [P.O.:480; I.V.:2872] Out: 2555 [Urine:500; Emesis/NG output:500; Drains:1005; Stool:550] This shift:  Total I/O In: 240 [P.O.:240] Out: 285 [Urine:100; Drains:60; Stool:125]  Bowel function:  Flatus:  YES  BM:  YES  Drain: Serosanguinous   Physical Exam:  General: Pt awake/alert in no acute distress.  Hard but not toxic. Eyes: PERRL, normal EOM.  Sclera clear.  No icterus Neuro: CN II-XII intact w/o focal sensory/motor deficits. Lymph: No head/neck/groin lymphadenopathy Psych:  No delerium/psychosis/paranoia.  Oriented x 4 HENT: Normocephalic, Mucus membranes moist.  No thrush Neck: Supple, No tracheal deviation.  No obvious thyromegaly Chest: No pain to chest wall compression.  Good respiratory excursion.  No audible wheezing CV:  Pulses intact.  Regular rhythm.  No major extremity edema MS: Normal AROM mjr joints.  No obvious deformity  Abdomen: Soft.  Mildy distended.  Mildly tender at incisions only.  Left lower quadrant loop ileostomy pink with scant flatus and no stool in bag.  No evidence of peritonitis.  No incarcerated hernias.  Ext:   No deformity.  No mjr edema.  No cyanosis Skin: No petechiae / purpurea.  No major sores.  Warm and dry    Results:   Cultures: No results found for this or any previous visit (from the past 720 hour(s)).  Labs: Results for orders placed or performed during the hospital encounter of 04/04/23 (from the past 48 hour(s))  Glucose, capillary     Status: Abnormal   Collection Time: 04/05/23 11:54 AM  Result Value Ref Range   Glucose-Capillary 124 (H) 70 - 99 mg/dL    Comment: Glucose reference range applies only to samples taken after fasting for at least 8 hours.  Glucose, capillary     Status: Abnormal   Collection Time: 04/05/23  4:30 PM  Result Value Ref Range   Glucose-Capillary 118 (H) 70 - 99 mg/dL    Comment: Glucose reference range applies only to samples taken after fasting for at least 8 hours.  Glucose, capillary     Status: Abnormal   Collection Time: 04/05/23  9:33 PM  Result Value Ref Range   Glucose-Capillary 112 (H) 70 - 99 mg/dL    Comment: Glucose reference range applies only to samples taken after fasting for at  least 8 hours.  CBC     Status: Abnormal   Collection Time: 04/06/23  5:30 AM  Result Value Ref Range   WBC 9.7 4.0 - 10.5 K/uL   RBC 4.23 3.87 - 5.11 MIL/uL   Hemoglobin 14.1 12.0 - 15.0 g/dL   HCT 16.1 09.6 - 04.5 %   MCV 101.2 (H) 80.0 - 100.0 fL   MCH 33.3 26.0 - 34.0 pg   MCHC 32.9 30.0 - 36.0 g/dL   RDW 40.9 81.1 - 91.4 %   Platelets 234 150 - 400 K/uL   nRBC 0.0 0.0 - 0.2 %    Comment: Performed at [facility], [address], [place], [place] [address]  Basic metabolic panel     Status: Abnormal   Collection Time: 04/06/23  5:30 AM  Result Value Ref Range   Sodium 136 135 - 145 mmol/L   Potassium 4.1 3.5 - 5.1 mmol/L   Chloride 108 98 - 111 mmol/L   CO2 21 (L) 22 - 32 mmol/L  Glucose, Bld 112 (H) 70 - 99 mg/dL    Comment: Glucose reference range applies only to samples taken after fasting for at least 8 hours.   BUN 8 6 - 20 mg/dL   Creatinine, Ser 4.09 0.44 - 1.00 mg/dL   Calcium 7.8 (L) 8.9 - 10.3 mg/dL   GFR, Estimated >81 >19 mL/min    Comment: (NOTE) Calculated using the CKD-EPI Creatinine Equation (2021)    Anion gap 7 5 - 15    Comment: Performed at [facility], [address], [place], [place] [address]  Hemoglobin A1c     Status: None   Collection Time: 04/06/23  5:30 AM  Result Value Ref Range   Hgb A1c MFr Bld 5.3 4.8 - 5.6 %    Comment: (NOTE) Pre diabetes:          5.7%-6.4%  Diabetes:              >6.4%  Glycemic control for   <7.0% adults with diabetes    Mean Plasma Glucose 105.41 mg/dL    Comment: Performed at [facility], [address], [place], [place] [address]  Glucose, capillary     Status: Abnormal   Collection Time: 04/06/23  7:33 AM  Result Value Ref Range   Glucose-Capillary 101 (H) 70 - 99 mg/dL    Comment: Glucose reference range applies only to samples taken after fasting for at least 8 hours.  Glucose, capillary     Status: None   Collection Time: 04/06/23 11:33 AM  Result  Value Ref Range   Glucose-Capillary 99 70 - 99 mg/dL    Comment: Glucose reference range applies only to samples taken after fasting for at least 8 hours.  Glucose, capillary     Status: Abnormal   Collection Time: 04/06/23  3:52 PM  Result Value Ref Range   Glucose-Capillary 105 (H) 70 - 99 mg/dL    Comment: Glucose reference range applies only to samples taken after fasting for at least 8 hours.  Glucose, capillary     Status: Abnormal   Collection Time: 04/06/23  9:40 PM  Result Value Ref Range   Glucose-Capillary 105 (H) 70 - 99 mg/dL    Comment: Glucose reference range applies only to samples taken after fasting for at least 8 hours.  CBC     Status: Abnormal   Collection Time: 04/07/23  7:09 AM  Result Value Ref Range   WBC 10.0 4.0 - 10.5 K/uL   RBC 4.53 3.87 - 5.11 MIL/uL   Hemoglobin 15.2 (H) 12.0 - 15.0 g/dL   HCT 30.8 65.7 - 84.6 %   MCV 100.4 (H) 80.0 - 100.0 fL   MCH 33.6 26.0 - 34.0 pg   MCHC 33.4 30.0 - 36.0 g/dL   RDW 96.2 95.2 - 84.1 %   Platelets 277 150 - 400 K/uL   nRBC 0.0 0.0 - 0.2 %    Comment: Performed at [facility] [place], [address] [place], [place] [address]  Basic metabolic panel     Status: Abnormal   Collection Time: 04/07/23  7:09 AM  Result Value Ref Range   Sodium 135 135 - 145 mmol/L   Potassium 4.1 3.5 - 5.1 mmol/L   Chloride 103 98 - 111 mmol/L   CO2 21 (L) 22 - 32 mmol/L   Glucose, Bld 107 (H) 70 - 99 mg/dL    Comment: Glucose reference range applies only to samples taken after fasting for at  least 8 hours.   BUN 11 6 - 20 mg/dL   Creatinine, Ser 8.65 0.44 - 1.00 mg/dL   Calcium 8.5 (L) 8.9 - 10.3 mg/dL   GFR, Estimated >78 >46 mL/min    Comment: (NOTE) Calculated using the CKD-EPI Creatinine Equation (2021)    Anion gap 11 5 - 15    Comment: Performed at [facility], [address], [place], [place] [address]    Imaging / Studies: No results found.  Medications / Allergies: per  chart  Antibiotics: Anti-infectives (From admission, onward)    Start     Dose/Rate Route Frequency Ordered Stop   04/04/23 2200  cefoTEtan (CEFOTAN) 2 g in sodium chloride 0.9 % 100 mL IVPB        2 g 200 mL/hr over 30 Minutes Intravenous Every 12 hours 04/04/23 1746 04/04/23 2329   04/04/23 1015  cefoTEtan (CEFOTAN) 2 g in sodium chloride 0.9 % 100 mL IVPB        2 g 200 mL/hr over 30 Minutes Intravenous On call to O.R. 04/04/23 1014 04/04/23 1900         Note: Portions of this report may have been transcribed using voice recognition software. Every effort was made to ensure accuracy; however, inadvertent computerized transcription errors may be present.   Any transcriptional errors that result from this process are unintentional.    Ardeth Sportsman, MD, FACS, MASCRS Esophageal, Gastrointestinal & Colorectal Surgery Robotic and Minimally Invasive Surgery  [facility] [address], [address] [place], [place] [address] [phone] Fax [phone] Main  CONTACT INFORMATION:  Weekday (9AM-5PM): Call [facility] main office at [phone]  Weeknight (5PM-9AM) or Weekend/Holiday: Check [email or web address] (password " [email or web address]") for General Surgery [facility] coverage  (Please, do not use SecureChat as it is not reliable communication to reach operating surgeons for immediate patient care given surgeries/outpatient duties/clinic/cross-coverage/off post-call which would lead to a delay in care.  Epic staff messaging available for outptient concerns, but may not be answered for 48 hours or more).     04/07/2023  9:53 AM

## 2023-04-07 NOTE — Progress Notes (Signed)
Notified by floor nurse that patient actually vomited.  Patient seemed to downplay with any of those concerns to me.  Now it is apparent she hardly ate anything and felt rather nauseated. .  Will down shift to full liquids for now.  More aggressive nausea control.  ############################  Nurse called again noting that patient's nausea was not well-controlled.  Wrote an order to consider NG tube placement if she does not get better or if she vomits again.

## 2023-04-08 LAB — CBC
HCT: 45.3 % (ref 36.0–46.0)
Hemoglobin: 15.2 g/dL — ABNORMAL HIGH (ref 12.0–15.0)
MCH: 33.2 pg (ref 26.0–34.0)
MCHC: 33.6 g/dL (ref 30.0–36.0)
MCV: 98.9 fL (ref 80.0–100.0)
Platelets: 311 10*3/uL (ref 150–400)
RBC: 4.58 MIL/uL (ref 3.87–5.11)
RDW: 12.6 % (ref 11.5–15.5)
WBC: 7.8 10*3/uL (ref 4.0–10.5)
nRBC: 0 % (ref 0.0–0.2)

## 2023-04-08 LAB — CREATININE, SERUM
Creatinine, Ser: 0.82 mg/dL (ref 0.44–1.00)
GFR, Estimated: >60 mL/min (ref >60)

## 2023-04-08 LAB — POTASSIUM: Potassium: 4.1 mmol/L (ref 3.5–5.1)

## 2023-04-08 MED ORDER — LACTATED RINGERS IV SOLN: INTRAVENOUS | Status: DC

## 2023-04-08 MED ORDER — MAGIC MOUTHWASH
10.0000 mL | Freq: Three times a day (TID) | ORAL | Status: AC
  Administered 2023-04-08 – 2023-04-09 (×4): 10 mL via ORAL
  Filled 2023-04-08 (×6): qty 10

## 2023-04-08 NOTE — Progress Notes (Signed)
Inserted NG per order due to pt vomiting @ 1800. NG inserted into lt nare without difficulty and advanced. Once placed pt started dry heaving and stated she was going to vomit. Pt vomited approx. into trashcan of bright green liquid. NG tube filled with green liquid during emesis episode. Pt hooked to LIWS and green liquid began filling canister. Xray ordered to confirm correct placement.

## 2023-04-08 NOTE — Progress Notes (Signed)
Terri Mckinney [id number] September 25, 1981  CARE TEAM:  PCP: Junie Spencer, FNP  Outpatient Care Team: Patient Care Team: Junie Spencer, FNP as PCP - General (Family Medicine) Doreatha Massed, MD as Medical Oncologist (Medical Oncology) Therese Sarah, RN as Oncology Nurse Navigator (Medical Oncology)  Inpatient Treatment Team: Treatment Team: Attending Provider: Romie Levee, MD; WOC Nurse: McNichol, Bonney Aid, RN; Nurse Practitioner: Doylene Bode, NP; WOC Nurse: Vassie Loll, RN; Licensed Practical Nurse: Wright, [place] E, LPN; Technician: Christell Constant, NT; Case Manager: Redmond Baseman, RN; Utilization Review: Deveron Furlong, RN; Pharmacist: Herby Abraham, [facility]   Problem List:   Principal Problem:   Rectal cancer metastasized to liver [facility]) Active Problems:   Rectal cancer (HCC)   Cervical mass   Ileostomy in place [facility])   4 Days Post-Op  04/04/2023  POST-OPERATIVE DIAGNOSIS:  RECTAL CANCER   PROCEDURE:   XI ROBOTIC ASSISTED LOWER ANTERIOR RESECTION DIVERTING ILEOSTOMY INDOCYANINE GREEN FLUORESCENCE IMAGING (ICG)  Surgeon(s): Romie Levee, MD  OR FINDINGS:    Patient had mid rectal mass with significant scarring from radiation distally   No obvious metastatic disease on visceral parietal peritoneum or liver.   The anastomosis rests 0 cm from the anal verge by rigid proctoscopy.    OPERATIVE NOTE  Post-operative Diagnosis: Rectal cancer with cervical involvement on imaging   Operation: Robotic-assisted laparoscopic total hysterectomy with bilateral salpingo-oophorectomy, omental J-flap to cover vaginal cuff   Surgeon: Eugene Garnet MD  Operative Findings:  On EUA, small, mobile uterus.  Normal and somewhat atrophic appearing fallopian tubes and ovaries.  Uterus 6 cm in size and normal in appearance.  Somewhat fuller posterior cervix with rectum adherent to the base of the cervix.  No peritoneal findings.       Assessment  Ileus with emesis.  [facility] Stay = 4 days)  Plan:  NG tube to lower intermittent wall suction.  Half maintenance IV fluids for now.  If continues to have some ostomy output and feeling better, most likely will consider clamping trials tomorrow.  Defer to Dr. Maisie Fus who should be returning.  ERAS protocol  Multimodal pain control   Follow glucose.  Seems to be improved.  A1c normal argues against chronic diabetes.  Follow-up on pathology  Follow ileostomy output.  Ostomy care and teaching.  Patient is used to colostomy but may have more high output issues with the conversion to a temporary diverting loop ileostomy.  We will see  -VTE prophylaxis- SCDs, etc. most likely will need continued aggressive anticoagulation 30 days postop to decrease risk of DVT  -mobilize as tolerated to help recovery  Disposition: TBD -was likely a few more days before considering discharge       I reviewed nursing notes, Consultant GYN ONC, WOCN notes, last 24 h vitals and pain scores, last 48 h intake and output, last 24 h labs and trends, and last 24 h imaging results. I have reviewed this patient's available data, including medical history, events of note, test results, etc as part of my evaluation.  A significant portion of that time was spent in counseling.  Care during the described time interval was provided by me.  This care required moderate level of medical decision making.  04/08/2023    Subjective: (Chief complaint)  Patient with recurrent emesis.  Initially resistant to but eventually relented to NG tube placement.  Large volume of return.  Patient with less abdominal pain.  Some intermittent ileostomy output yesterday but has tapered  off this morning.  Daughter in room.  Denies any major abdominal pain.   Objective:  Vital signs:  Vitals:   04/07/23 1559 04/07/23 2016 04/08/23 0005 04/08/23 0404  BP: 135/87 131/69 (!) 138/92 135/89  Pulse: (!) 120  98 (!) 108   Resp:  18 18 18   Temp: 98.4 F (36.9 C) 97.9 F (36.6 C) 98.2 F (36.8 C) 97.8 F (36.6 C)  TempSrc: Oral     SpO2: 97% 98% 97% 99%  Weight:      Height:        Last BM Date : 04/06/23  Intake/Output   Yesterday:  05/04 0701 - 05/05 0700 In: 710 [P.O.:600; IV Piggyback:110] Out: 2166 [Urine:500; Emesis/NG output:601; Drains:220; Stool:845] This shift:  No intake/output data recorded.  Bowel function:  Flatus: YES  BM:  YES -scant out ileostomy.  Drain: Serosanguinous   Physical Exam:  General: Pt awake/alert in no acute distress.  Hard but not toxic. Eyes: PERRL, normal EOM.  Sclera clear.  No icterus Neuro: CN II-XII intact w/o focal sensory/motor deficits. Lymph: No head/neck/groin lymphadenopathy Psych:  No delerium/psychosis/paranoia.  Oriented x 4 HENT: Normocephalic, Mucus membranes moist.  No thrush Neck: Supple, No tracheal deviation.  No obvious thyromegaly Chest: No pain to chest wall compression.  Good respiratory excursion.  No audible wheezing CV:  Pulses intact.  Regular rhythm.  No major extremity edema MS: Normal AROM mjr joints.  No obvious deformity  Abdomen: Soft.  Mildy distended.  Mildly tender at incisions only.  Left lower quadrant loop ileostomy pink with scant flatus and no stool in bag.  No evidence of peritonitis.  No incarcerated hernias.  Ext:   No deformity.  No mjr edema.  No cyanosis Skin: No petechiae / purpurea.  No major sores.  Warm and dry    Results:   Cultures: No results found for this or any previous visit (from the past 720 hour(s)).  Labs: Results for orders placed or performed during the hospital encounter of 04/04/23 (from the past 48 hour(s))  Glucose, capillary     Status: None   Collection Time: 04/06/23 11:33 AM  Result Value Ref Range   Glucose-Capillary 99 70 - 99 mg/dL    Comment: Glucose reference range applies only to samples taken after fasting for at least 8 hours.  Glucose, capillary      Status: Abnormal   Collection Time: 04/06/23  3:52 PM  Result Value Ref Range   Glucose-Capillary 105 (H) 70 - 99 mg/dL    Comment: Glucose reference range applies only to samples taken after fasting for at least 8 hours.  Glucose, capillary     Status: Abnormal   Collection Time: 04/06/23  9:40 PM  Result Value Ref Range   Glucose-Capillary 105 (H) 70 - 99 mg/dL    Comment: Glucose reference range applies only to samples taken after fasting for at least 8 hours.  CBC     Status: Abnormal   Collection Time: 04/07/23  7:09 AM  Result Value Ref Range   WBC 10.0 4.0 - 10.5 K/uL   RBC 4.53 3.87 - 5.11 MIL/uL   Hemoglobin 15.2 (H) 12.0 - 15.0 g/dL   HCT 21.3 08.6 - 57.8 %   MCV 100.4 (H) 80.0 - 100.0 fL   MCH 33.6 26.0 - 34.0 pg   MCHC 33.4 30.0 - 36.0 g/dL   RDW 46.9 62.9 - 52.8 %   Platelets 277 150 - 400 K/uL   nRBC 0.0 0.0 -  0.2 %    Comment: Performed at [facility], [address], [place], [place] [address]  Basic metabolic panel     Status: Abnormal   Collection Time: 04/07/23  7:09 AM  Result Value Ref Range   Sodium 135 135 - 145 mmol/L   Potassium 4.1 3.5 - 5.1 mmol/L   Chloride 103 98 - 111 mmol/L   CO2 21 (L) 22 - 32 mmol/L   Glucose, Bld 107 (H) 70 - 99 mg/dL    Comment: Glucose reference range applies only to samples taken after fasting for at least 8 hours.   BUN 11 6 - 20 mg/dL   Creatinine, Ser 6.04 0.44 - 1.00 mg/dL   Calcium 8.5 (L) 8.9 - 10.3 mg/dL   GFR, Estimated >54 >09 mL/min    Comment: (NOTE) Calculated using the CKD-EPI Creatinine Equation (2021)    Anion gap 11 5 - 15    Comment: Performed at [facility], [address] [place], [place] [address]  CBC     Status: Abnormal   Collection Time: 04/08/23  5:39 AM  Result Value Ref Range   WBC 7.8 4.0 - 10.5 K/uL   RBC 4.58 3.87 - 5.11 MIL/uL   Hemoglobin 15.2 (H) 12.0 - 15.0 g/dL   HCT 47.8 29.5 - 62.1 %   MCV 98.9 80.0 - 100.0 fL   MCH 33.2 26.0 - 34.0 pg    MCHC 33.6 30.0 - 36.0 g/dL   RDW 30.8 65.7 - 84.6 %   Platelets 311 150 - 400 K/uL   nRBC 0.0 0.0 - 0.2 %    Comment: Performed at [facility], [address] [place], [place] [address]    Imaging / Studies: DG Abd Portable 1V  Result Date: 04/07/2023 CLINICAL DATA:  Enteric catheter placement EXAM: PORTABLE ABDOMEN - 1 VIEW COMPARISON:  None Available. FINDINGS: Frontal view of the lower chest and upper abdomen demonstrates enteric catheter passing below diaphragm tip and side port projecting over the gastric fundus. Distended gas-filled loops of small bowel throughout the upper abdomen measuring up to 4.7 cm, consistent with small-bowel obstruction. Lung bases are clear. No acute bony abnormalities. IMPRESSION: 1. Enteric catheter tip projecting over the gastric fundus. 2. Distended gas-filled loops of small bowel consistent with obstruction. Electronically Signed   By: Sharlet Salina M.D.   On: 04/07/2023 21:44    Medications / Allergies: per chart  Antibiotics: Anti-infectives (From admission, onward)    Start     Dose/Rate Route Frequency Ordered Stop   04/04/23 2200  cefoTEtan (CEFOTAN) 2 g in sodium chloride 0.9 % 100 mL IVPB        2 g 200 mL/hr over 30 Minutes Intravenous Every 12 hours 04/04/23 1746 04/04/23 2329   04/04/23 1015  cefoTEtan (CEFOTAN) 2 g in sodium chloride 0.9 % 100 mL IVPB        2 g 200 mL/hr over 30 Minutes Intravenous On call to O.R. 04/04/23 1014 04/04/23 1900         Note: Portions of this report may have been transcribed using voice recognition software. Every effort was made to ensure accuracy; however, inadvertent computerized transcription errors may be present.   Any transcriptional errors that result from this process are unintentional.    Ardeth Sportsman, MD, FACS, MASCRS Esophageal, Gastrointestinal & Colorectal Surgery Robotic and Minimally Invasive Surgery  [facility] [phone]  [place], [place] [address] [phone] Fax [phone] Main  CONTACT INFORMATION:  Weekday (9AM-5PM): Call [facility] main office at [phone]  Weeknight (5PM-9AM) or Weekend/Holiday: Check [email or web address] (password " [email or web address]") for General Surgery [facility] coverage  (Please, do not use SecureChat as it is not reliable communication to reach operating surgeons for immediate patient care given surgeries/outpatient duties/clinic/cross-coverage/off post-call which would lead to a delay in care.  Epic staff messaging available for outptient concerns, but may not be answered for 48 hours or more).     04/08/2023  9:03 AM

## 2023-04-08 NOTE — Progress Notes (Signed)
4 Days Post-Op Procedure(s) (LRB): XI ROBOTIC ASSISTED LOWER ANTERIOR RESECTION (N/A) DIVERTING ILEOSTOMY (N/A) XI ROBOTIC ASSISTED TOTAL HYSTERECTOMY WITH BILATERAL SALPINGO OOPHORECTOMY (N/A) INDOCYANINE GREEN FLUORESCENCE IMAGING (ICG) (N/A)  Subjective: Pt had more emesis yesterday and had NGT placed. She reports that she feels a little better now that the NGT is in place. Occasional cramping abdominal pain. Otherwise pain appropriately controlled. Voiding. Hasn't ambulated following NGT placement.   Objective: Vital signs in last 24 hours: Temp:  [97.2 F (36.2 C)-98.4 F (36.9 C)] 97.8 F (36.6 C) (05/05 0404) Pulse Rate:  [86-120] 86 (05/05 0951) Resp:  [16-18] 16 (05/05 0951) BP: (115-138)/(69-97) 115/97 (05/05 0951) SpO2:  [97 %-99 %] 99 % (05/05 0951) Last BM Date : 04/06/23  Intake/Output from previous day: 05/04 0701 - 05/05 0700 In: 710 [P.O.:600; IV Piggyback:110] Out: 2166 [Urine:500; Emesis/NG output:601; Drains:220; Stool:845]  Physical Examination: Gen: NAD, alert and oriented CV: regular rate and rhythm, no murmurs or rubs Pulm: lungs clear to auscultation bilaterally, no wheezes or rhochi Abd: decreased bowel sounds, appropriately tender, minimal output in ostomy bag. Serosanguinous drainage in JP drain. Abdominal dressing dry and intact. NGT with large output. Ext: warm and well perfused, no edema    Labs:    Latest Ref Rng & Units 04/08/2023    5:39 AM 04/07/2023    7:09 AM 04/06/2023    5:30 AM  CBC  WBC 4.0 - 10.5 K/uL 7.8  10.0  9.7   Hemoglobin 12.0 - 15.0 g/dL 14.7  82.9  56.2   Hematocrit 36.0 - 46.0 % 45.3  45.5  42.8   Platelets 150 - 400 K/uL 311  277  234       Latest Ref Rng & Units 04/08/2023    5:39 AM 04/07/2023    7:09 AM 04/06/2023    5:30 AM  BMP  Glucose 70 - 99 mg/dL  130  865   BUN 6 - 20 mg/dL  11  8   Creatinine 7.84 - 1.00 mg/dL 6.96  2.95  2.84   Sodium 135 - 145 mmol/L  135  136   Potassium 3.5 - 5.1 mmol/L 4.1  4.1  4.1    Chloride 98 - 111 mmol/L  103  108   CO2 22 - 32 mmol/L  21  21   Calcium 8.9 - 10.3 mg/dL  8.5  7.8    Assessment/Recommendations  42 y.o. s/p Procedure(s): XI ROBOTIC ASSISTED LOWER ANTERIOR RESECTION DIVERTING ILEOSTOMY XI ROBOTIC ASSISTED TOTAL HYSTERECTOMY WITH BILATERAL SALPINGO OOPHORECTOMY INDOCYANINE GREEN FLUORESCENCE IMAGING (ICG): with postoperative ileus.  Post-op: Continue postop care per primary service. Agree with NGT due to likely postop ileus.  - Postop restrictions include no heavy lifting for at least 6 weeks and nothing in the vagina for at least 12 weeks.  Surgical menopause: Recommendations to start vaginal estrogen (for vaginal atrophy and to aide in healing of the vaginal cuff given previous radiation) and ERT with a patch approximately 1 week after surgery. Instructions will be placed in her discharge paperwork and scripts sent in to pharmacy.     LOS: 4 days    Diamond Martucci 04/08/2023, 10:46 AM

## 2023-04-09 ENCOUNTER — Other Ambulatory Visit (HOSPITAL_COMMUNITY): Payer: Self-pay

## 2023-04-09 LAB — BASIC METABOLIC PANEL
Anion gap: 12 (ref 5–15)
BUN: 16 mg/dL (ref 6–20)
CO2: 25 mmol/L (ref 22–32)
Calcium: 8.4 mg/dL — ABNORMAL LOW (ref 8.9–10.3)
Chloride: 97 mmol/L — ABNORMAL LOW (ref 98–111)
Creatinine, Ser: 0.76 mg/dL (ref 0.44–1.00)
GFR, Estimated: >60 mL/min (ref >60)
Glucose, Bld: 116 mg/dL — ABNORMAL HIGH (ref 70–99)
Potassium: 4 mmol/L (ref 3.5–5.1)
Sodium: 134 mmol/L — ABNORMAL LOW (ref 135–145)

## 2023-04-09 LAB — CBC
HCT: 40.7 % (ref 36.0–46.0)
Hemoglobin: 13.8 g/dL (ref 12.0–15.0)
MCH: 33.7 pg (ref 26.0–34.0)
MCHC: 33.9 g/dL (ref 30.0–36.0)
MCV: 99.5 fL (ref 80.0–100.0)
Platelets: 272 10*3/uL (ref 150–400)
RBC: 4.09 MIL/uL (ref 3.87–5.11)
RDW: 12.6 % (ref 11.5–15.5)
WBC: 10.2 10*3/uL (ref 4.0–10.5)
nRBC: 0 % (ref 0.0–0.2)

## 2023-04-09 NOTE — Consult Note (Addendum)
WOC Nurse ostomy follow up Stoma type/location: Pt is familiar with ostomy care since she previously had a colostomy.  Discussed differences with ileostomy regarding output consistency, importance of avoiding dehydration, and dietary precautions. Pt verbalizes understanding. Stomal assessment/size: Stoma is 1 1/4 inches, red and viable, above skin level.  Peristomal assessment: previously noted MARSI at distal aspect of the tape border medially and laterally, ruptured blisters are now pink and dry and slowly resolving.  Output liquid green; 50cc brown liquid; patient currently has an NG tube.  Ostomy pouching: Pt assisted with application of barrier ring and 2 piecc pouching system. Use supplies: barrier ring [employer] [place] # [id number], wafer [employer] [place] # [id number], pouch Lawson # [id number] Enrolled patient in [place] [employer] Start Discharge program: Yes, however SS was previously notified of the conversion to loop ileostomy   2 pouching systems and barrier rings in the room for DC.  Pt has her own supplies at the bedside and will be able to use the same products as prior to admission.   WOC Nurse will follow along with you for continued support with ostomy teaching and care. Thank-you,  Cammie Mcgee MSN, RN, CWOCN, [place], CNS [phone]

## 2023-04-09 NOTE — Progress Notes (Signed)
GYN Oncology Progress Note  Patient resting in bed with daughter at bedside. Having irritation/discomfort from the NG tube. States she has had ostomy output. Has been out of bed and reports having to move slowly. Pain is manageable.   Dr. Maisie Fus now in the room. No needs or concerns voiced per pt related to GYN ONC surgery. Continue with plan per Dr. Maisie Fus.

## 2023-04-09 NOTE — Progress Notes (Signed)
5 Days Post-Op Robotic LAR w/ DLI, TAH/BSO Subjective: Mainly having trouble tolerating NG.  Ostomy functioning well now  Objective: Vital signs in last 24 hours: Temp:  [97.5 F (36.4 C)-99 F (37.2 C)] 98.3 F (36.8 C) (05/06 0610) Pulse Rate:  [86-142] 114 (05/06 0800) Resp:  [16-19] 18 (05/06 0610) BP: (111-143)/(69-97) 111/84 (05/06 0610) SpO2:  [97 %-99 %] 97 % (05/06 0610)   Intake/Output from previous day: 05/05 0701 - 05/06 0700 In: 1111.5 [P.O.:80; I.V.:1031.5] Out: 3410 [Urine:1200; Emesis/NG output:850; Drains:185; Stool:1175] Intake/Output this shift: No intake/output data recorded.   General appearance: alert and cooperative GI: soft, non-tender; Incision: no significant drainage  Lab Results:  Recent Labs    04/08/23 0539 04/09/23 0441  WBC 7.8 10.2  HGB 15.2* 13.8  HCT 45.3 40.7  PLT 311 272    BMET Recent Labs    04/07/23 0709 04/08/23 0539 04/09/23 0441  NA 135  --  134*  K 4.1 4.1 4.0  CL 103  --  97*  CO2 21*  --  25  GLUCOSE 107*  --  116*  BUN 11  --  16  CREATININE 0.86 0.82 0.76  CALCIUM 8.5*  --  8.4*    PT/INR No results for input(s): "LABPROT", "INR" in the last 72 hours. ABG No results for input(s): "PHART", "HCO3" in the last 72 hours.  Invalid input(s): "PCO2", "PO2"  MEDS, Scheduled  enoxaparin (LOVENOX) injection  40 mg Subcutaneous Q24H   lip balm   Topical BID   magic mouthwash  10 mL Oral TID   sodium chloride flush  3 mL Intravenous Q12H    Studies/Results: DG Abd Portable 1V  Result Date: 04/07/2023 CLINICAL DATA:  Enteric catheter placement EXAM: PORTABLE ABDOMEN - 1 VIEW COMPARISON:  None Available. FINDINGS: Frontal view of the lower chest and upper abdomen demonstrates enteric catheter passing below diaphragm tip and side port projecting over the gastric fundus. Distended gas-filled loops of small bowel throughout the upper abdomen measuring up to 4.7 cm, consistent with small-bowel obstruction. Lung bases  are clear. No acute bony abnormalities. IMPRESSION: 1. Enteric catheter tip projecting over the gastric fundus. 2. Distended gas-filled loops of small bowel consistent with obstruction. Electronically Signed   By: Sharlet Salina M.D.   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  If >272ml will d/c NG Hyperglycemia: no h/o DM, HgbA1c pending, cont SSI Hyperkalemia: most likely related to elevated BG.  Resolved with glucose control Ambulate in hall IV/PO pain meds as needed Ostomy teaching.  Pt used to ostomy but may need help transitioning to ileostomy  Given recent pelvic surgery, cancer diagnosis and new estrogen replacement, I recommend continued dvt prophylaxis for 30 days after surgery.  Working with pharmacy to get this set up at d/c     LOS: 5 days   .Vanita Panda, MD [facility] Surgery, [place]    04/09/2023 8:45 AM

## 2023-04-09 NOTE — Progress Notes (Signed)
Pt reports NG tube came out of her nose when she sneezed. NG was clamped prior to this sneeze, I did not attempt reinsertion.

## 2023-04-10 ENCOUNTER — Encounter: Payer: Self-pay | Admitting: Hematology

## 2023-04-10 ENCOUNTER — Other Ambulatory Visit (HOSPITAL_COMMUNITY): Payer: Self-pay

## 2023-04-10 LAB — BASIC METABOLIC PANEL
Anion gap: 12 (ref 5–15)
BUN: 13 mg/dL (ref 6–20)
CO2: 21 mmol/L — ABNORMAL LOW (ref 22–32)
Calcium: 8.1 mg/dL — ABNORMAL LOW (ref 8.9–10.3)
Chloride: 99 mmol/L (ref 98–111)
Creatinine, Ser: 0.61 mg/dL (ref 0.44–1.00)
GFR, Estimated: >60 mL/min (ref >60)
Glucose, Bld: 90 mg/dL (ref 70–99)
Potassium: 3.7 mmol/L (ref 3.5–5.1)
Sodium: 132 mmol/L — ABNORMAL LOW (ref 135–145)

## 2023-04-10 LAB — SURGICAL PATHOLOGY

## 2023-04-10 MED ORDER — APIXABAN 2.5 MG PO TABS
2.5000 mg | ORAL_TABLET | Freq: Two times a day (BID) | ORAL | 0 refills | Status: DC
  Filled 2023-04-10: qty 60, 30d supply, fill #0

## 2023-04-10 MED ORDER — OXYCODONE HCL 5 MG PO TABS
5.0000 mg | ORAL_TABLET | Freq: Four times a day (QID) | ORAL | 0 refills | Status: DC | PRN
  Filled 2023-04-10: qty 20, 5d supply, fill #0

## 2023-04-10 MED ORDER — OXYCODONE HCL 5 MG PO TABS
5.0000 mg | ORAL_TABLET | ORAL | Status: DC | PRN
  Filled 2023-04-10: qty 1

## 2023-04-10 NOTE — Progress Notes (Signed)
GYN Oncology Progress Note  Patient up in the room, in bathroom with daughter and RN at the bedside. She reports doing well this am. No nausea or emesis reported. Feels bloated. Has been ambulating frequently. States she sneezed her NG tube out. + Output from ostomy. Feels steady when out of bed. No needs or concerns voiced. Continue plan of care per Dr. Maisie Fus.

## 2023-04-10 NOTE — Progress Notes (Signed)
6 Days Post-Op Robotic LAR w/ DLI, TAH/BSO Subjective: NG out.  Ostomy functioning.  Ambulating.  Feels bloated.  Denies nausea Objective: Vital signs in last 24 hours: Temp:  [97.4 F (36.3 C)-100.3 F (37.9 C)] 99.7 F (37.6 C) (05/07 0515) Pulse Rate:  [106-136] 106 (05/07 0515) Resp:  [14-20] 17 (05/07 0515) BP: (125-144)/(69-77) 127/69 (05/07 0515) SpO2:  [97 %-99 %] 98 % (05/07 0515) Weight:  [73.8 kg] 73.8 kg (05/07 0500)   Intake/Output from previous day: 05/06 0701 - 05/07 0700 In: 1889.9 [I.V.:1889.9] Out: 1675 [Urine:750; Emesis/NG output:300; Stool:625] Intake/Output this shift: No intake/output data recorded.   General appearance: alert and cooperative GI: soft, non-tender; Incision: no significant drainage  Lab Results:  Recent Labs    04/08/23 0539 04/09/23 0441  WBC 7.8 10.2  HGB 15.2* 13.8  HCT 45.3 40.7  PLT 311 272    BMET Recent Labs    04/09/23 0441 04/10/23 0444  NA 134* 132*  K 4.0 3.7  CL 97* 99  CO2 25 21*  GLUCOSE 116* 90  BUN 16 13  CREATININE 0.76 0.61  CALCIUM 8.4* 8.1*    PT/INR No results for input(s): "LABPROT", "INR" in the last 72 hours. ABG No results for input(s): "PHART", "HCO3" in the last 72 hours.  Invalid input(s): "PCO2", "PO2"  MEDS, Scheduled  enoxaparin (LOVENOX) injection  40 mg Subcutaneous Q24H   lip balm   Topical BID   magic mouthwash  10 mL Oral TID    Studies/Results: No results found.  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  Resolved with glucose control Ambulate in hall IV/PO pain meds as needed Ostomy teaching.  Pt used to ostomy but may need help transitioning to ileostomy  Given recent pelvic surgery, cancer diagnosis and new estrogen replacement, I recommend continued dvt prophylaxis for 30 days after surgery.  Working with pharmacy to get this set up at d/c     LOS: 6 days   .Vanita Panda, MD [facility] Surgery, [place]    04/10/2023 8:34 AM

## 2023-04-11 LAB — BASIC METABOLIC PANEL
Anion gap: 12 (ref 5–15)
BUN: 17 mg/dL (ref 6–20)
CO2: 26 mmol/L (ref 22–32)
Calcium: 8.9 mg/dL (ref 8.9–10.3)
Chloride: 93 mmol/L — ABNORMAL LOW (ref 98–111)
Creatinine, Ser: 0.7 mg/dL (ref 0.44–1.00)
GFR, Estimated: >60 mL/min (ref >60)
Glucose, Bld: 116 mg/dL — ABNORMAL HIGH (ref 70–99)
Potassium: 3.5 mmol/L (ref 3.5–5.1)
Sodium: 131 mmol/L — ABNORMAL LOW (ref 135–145)

## 2023-04-11 MED ORDER — ESTRADIOL 0.05 MG/24HR TD PTWK
0.0750 mg | MEDICATED_PATCH | TRANSDERMAL | Status: DC
  Administered 2023-04-11: 0.075 mg via TRANSDERMAL
  Filled 2023-04-11: qty 1

## 2023-04-11 MED ORDER — LACTATED RINGERS IV BOLUS
1000.0000 mL | Freq: Once | INTRAVENOUS | Status: AC
  Administered 2023-04-11: 1000 mL via INTRAVENOUS

## 2023-04-11 MED ORDER — ESTRADIOL 0.1 MG/GM VA CREA
1.0000 | TOPICAL_CREAM | Freq: Every day | VAGINAL | Status: DC
  Administered 2023-04-11 – 2023-04-12 (×2): 1 via VAGINAL
  Filled 2023-04-11: qty 42.5

## 2023-04-11 NOTE — Progress Notes (Signed)
7 Days Post-Op Robotic LAR w/ DLI, TAH/BSO Subjective: Tolerated some fulls yesterday.  Ostomy functioning.  Ambulating.  Felt bloated this am and vomited.  Felt better and hungry after that.  Denies nausea currently Objective: Vital signs in last 24 hours: Temp:  [97.5 F (36.4 C)-98.6 F (37 C)] 97.5 F (36.4 C) (05/08 0912) Pulse Rate:  [99-113] 113 (05/08 0912) Resp:  [16-18] 18 (05/08 0912) BP: (118-135)/(65-76) 127/65 (05/08 0912) SpO2:  [98 %-100 %] 98 % (05/08 0912) Weight:  [72.9 kg] 72.9 kg (05/08 0500)   Intake/Output from previous day: 05/07 0701 - 05/08 0700 In: 870.6 [P.O.:660; I.V.:210.6] Out: 1875 [Urine:700; Stool:1175] Intake/Output this shift: Total I/O In: -  Out: 750 [Urine:150; Emesis/NG output:300; Stool:300]   General appearance: alert and cooperative GI: soft, non-tender; Ostomy: beefy red Incision: no significant drainage  Lab Results:  Recent Labs    04/09/23 0441  WBC 10.2  HGB 13.8  HCT 40.7  PLT 272    BMET Recent Labs    04/10/23 0444 04/11/23 0450  NA 132* 131*  K 3.7 3.5  CL 99 93*  CO2 21* 26  GLUCOSE 90 116*  BUN 13 17  CREATININE 0.61 0.70  CALCIUM 8.1* 8.9    PT/INR No results for input(s): "LABPROT", "INR" in the last 72 hours. ABG No results for input(s): "PHART", "HCO3" in the last 72 hours.  Invalid input(s): "PCO2", "PO2"  MEDS, Scheduled  enoxaparin (LOVENOX) injection  40 mg Subcutaneous Q24H   lip balm   Topical BID    Studies/Results: No results found.  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  Pt used to ostomy but may need help transitioning to ileostomy  Given recent pelvic surgery, cancer diagnosis and new estrogen replacement, I recommend continued dvt prophylaxis for 30 days after surgery.  Rx is at [facility] pharmacy awaiting her discharge     LOS: 7 days   .Vanita Panda, MD [facility] Surgery, [place]    04/11/2023 9:19 AM

## 2023-04-11 NOTE — TOC Progression Note (Signed)
Transition of Care [facility]) - Progression Note   Patient Details  Name: Terri Mckinney MRN: [id number] Date of Birth: 06/07/1981  Transition of Care [facility]) CM/SW Contact  Ewing Schlein, LCSW Phone Number: 04/11/2023, 1:37 PM  Clinical Narrative: Per surgeon, patient will likely no longer need [facility] for the new ostomy as patient has had one before. CSW followed up with patient and patient confirmed she will not need the service at this time. CSW cancelled [facility] referral with Barbara Cower with [facility].     Expected Discharge Plan: Home/Self Care Barriers to Discharge: Continued Medical Work up  Expected Discharge Plan and Services In-house Referral: Clinical Social Work Living arrangements for the past 2 months: Single Family Home            DME Arranged: N/A DME Agency: NA HH Agency: [facility] ([facility]) Date HH Agency Contacted: 04/11/23 Representative spoke with at [facility] Agency: Barbara Cower  Social Determinants of Health (SDOH) Interventions SDOH Screenings   Food Insecurity: No Food Insecurity (04/04/2023)  Housing: Low Risk  (04/04/2023)  Transportation Needs: No Transportation Needs (04/04/2023)  Utilities: Not At Risk (04/04/2023)  Depression (PHQ2-9): Low Risk  (03/06/2023)  Tobacco Use: High Risk (04/05/2023)   Readmission Risk Interventions     No data to display

## 2023-04-11 NOTE — Progress Notes (Addendum)
7 Days Post-Op Procedure(s) (LRB): XI ROBOTIC ASSISTED LOWER ANTERIOR RESECTION (N/A) DIVERTING ILEOSTOMY (N/A) XI ROBOTIC ASSISTED TOTAL HYSTERECTOMY WITH BILATERAL SALPINGO OOPHORECTOMY (N/A) INDOCYANINE GREEN FLUORESCENCE IMAGING (ICG) (N/A)  Subjective: Pt just had episode of emesis. Reports feelings of abdominal bloating, pulling, and tightness this am. She took a dose of maalox and had emesis after. She did eat grits after. Abd symptoms relieved after emesis. States ostomy is having output and flatus. She took in full liquids yesterday. Pain manageable and related to abdominal tightness. Ambulating without difficulty. Had one episode of feeling off balance this am that resolved. Voiding without difficulty.  Objective: Vital signs in last 24 hours: Temp:  [97.5 F (36.4 C)-98.6 F (37 C)] 97.5 F (36.4 C) (05/08 0912) Pulse Rate:  [99-113] 113 (05/08 0912) Resp:  [16-18] 18 (05/08 0912) BP: (118-135)/(65-76) 127/65 (05/08 0912) SpO2:  [98 %-100 %] 98 % (05/08 0912) Weight:  [160 lb 11.5 oz (72.9 kg)] 160 lb 11.5 oz (72.9 kg) (05/08 0500) Last BM Date : 04/10/23  Intake/Output from previous day: 05/07 0701 - 05/08 0700 In: 870.6 [P.O.:660; I.V.:210.6] Out: 1875 [Urine:700; Stool:1175]  Physical Examination: Gen: NAD, alert and oriented CV: regular rate and rhythm, no murmurs or rubs Pulm: lungs clear to auscultation bilaterally, no wheezes or rhochi Abd: decreased bowel sounds, appropriately tender, stoma red with output in ostomy bag. Abdominal incision intact with no active drainage Ext: warm and well perfused, no edema   Labs:    Latest Ref Rng & Units 04/09/2023    4:41 AM 04/08/2023    5:39 AM 04/07/2023    7:09 AM  CBC  WBC 4.0 - 10.5 K/uL 10.2  7.8  10.0   Hemoglobin 12.0 - 15.0 g/dL 75.1  02.5  85.2   Hematocrit 36.0 - 46.0 % 40.7  45.3  45.5   Platelets 150 - 400 K/uL 272  311  277       Latest Ref Rng & Units 04/11/2023    4:50 AM 04/10/2023    4:44 AM 04/09/2023     4:41 AM  BMP  Glucose 70 - 99 mg/dL 778  90  242   BUN 6 - 20 mg/dL 17  13  16    Creatinine 0.44 - 1.00 mg/dL 3.53  6.14  4.31   Sodium 135 - 145 mmol/L 131  132  134   Potassium 3.5 - 5.1 mmol/L 3.5  3.7  4.0   Chloride 98 - 111 mmol/L 93  99  97   CO2 22 - 32 mmol/L 26  21  25    Calcium 8.9 - 10.3 mg/dL 8.9  8.1  8.4    Assessment/Recommendations 42 y.o. s/p Procedure(s): XI ROBOTIC ASSISTED LOWER ANTERIOR RESECTION DIVERTING ILEOSTOMY XI ROBOTIC ASSISTED TOTAL HYSTERECTOMY WITH BILATERAL SALPINGO OOPHORECTOMY INDOCYANINE GREEN FLUORESCENCE IMAGING (ICG)  Post-op: Continue postop care per [facility] as primary service. To continue with liquids today.   Surgical menopause: Recommendations to start vaginal estrogen (for vaginal atrophy and to aide in healing of the vaginal cuff given previous radiation) and ERT with a patch starting today per Dr. Pricilla Holm. Instructions will be placed in her discharge paperwork and scripts sent in to pharmacy.     LOS: 7 days    Doylene Bode 04/11/2023, 12:14 PM

## 2023-04-11 NOTE — Progress Notes (Signed)
42 year old female admitted for rectal cancer metastasized to liver. Patient is alert and oriented x 4. Speech is clear. Grip strength is equal on both sides. No acute distress. Skin clean, dry, intact, and warm to touch. Vital signs are: T: 97.7, HR: 151, BP: 118/70, RR: 18, O2: 100% via RA. Glucose is 116. S1 and S2 heard. Regular rate and rhythm. No murmurs. Breath sounds heard and is clear. No adventitious sounds. Ileostomy loop LLQ. Active bowel sounds in all quadrants. Abdomen soft and non tender. Ostomy is pink and intact. Patient vomited this morning and still complains of nausea. Antiemetic medication was administered. Urine clear and yellow in color. Patient has a 5mm cyst anterior of cervix. 8cm tumor distance from anal verge/skin surface. Implanted port right chest. Peripheral IV Anterior at forearm. Patient states pain is 0-10.   Mel Almond, Student RN

## 2023-04-12 ENCOUNTER — Other Ambulatory Visit (HOSPITAL_COMMUNITY): Payer: Self-pay

## 2023-04-12 MED ORDER — LACTATED RINGERS IV BOLUS
1000.0000 mL | Freq: Once | INTRAVENOUS | Status: AC
  Administered 2023-04-12: 1000 mL via INTRAVENOUS

## 2023-04-12 MED ORDER — CALCIUM POLYCARBOPHIL 625 MG PO TABS
625.0000 mg | ORAL_TABLET | Freq: Two times a day (BID) | ORAL | Status: DC
  Administered 2023-04-12 – 2023-04-13 (×3): 625 mg via ORAL
  Filled 2023-04-12 (×3): qty 1

## 2023-04-12 NOTE — Progress Notes (Addendum)
8 Days Post-Op Robotic LAR w/ DLI, TAH/BSO Subjective: Tolerated fulls.  Ostomy functioning.  Ambulating.  No n/v, abdominal tightness resolved.  Now hungry  Objective: Vital signs in last 24 hours: Temp:  [97.5 F (36.4 C)-98.7 F (37.1 C)] 98 F (36.7 C) (05/09 0517) Pulse Rate:  [102-151] 103 (05/09 0517) Resp:  [14-18] 17 (05/09 0517) BP: (118-127)/(65-77) 124/76 (05/09 0517) SpO2:  [97 %-100 %] 97 % (05/09 0517) Weight:  [70.3 kg] 70.3 kg (05/09 0500)   Intake/Output from previous day: 05/08 0701 - 05/09 0700 In: 1663.2 [P.O.:600; I.V.:900; IV Piggyback:163.2] Out: 5055 [Urine:900; Emesis/NG output:300; Stool:3855] Intake/Output this shift: No intake/output data recorded.   General appearance: alert and cooperative GI: soft, non-tender; Ostomy: beefy red Incision: no significant drainage  Lab Results:  No results for input(s): "WBC", "HGB", "HCT", "PLT" in the last 72 hours. BMET Recent Labs    04/10/23 0444 04/11/23 0450  NA 132* 131*  K 3.7 3.5  CL 99 93*  CO2 21* 26  GLUCOSE 90 116*  BUN 13 17  CREATININE 0.61 0.70  CALCIUM 8.1* 8.9   PT/INR No results for input(s): "LABPROT", "INR" in the last 72 hours. ABG No results for input(s): "PHART", "HCO3" in the last 72 hours.  Invalid input(s): "PCO2", "PO2"  MEDS, Scheduled  enoxaparin (LOVENOX) injection  40 mg Subcutaneous Q24H   estradiol  0.075 mg Transdermal Weekly   estradiol  1 Applicatorful Vaginal QHS   lip balm   Topical BID   polycarbophil  625 mg Oral BID    Studies/Results: No results found.  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  Pt used to ostomy but may need help transitioning to ileostomy  Given recent pelvic surgery, cancer diagnosis and new estrogen replacement, I recommend continued dvt prophylaxis for 30 days after surgery.  Rx is at [facility] pharmacy awaiting her discharge     LOS: 8 days   Marin Olp, MD [facility] Surgery, [facility] Practice  04/12/2023 7:39 AM

## 2023-04-12 NOTE — Progress Notes (Signed)
8 Days Post-Op Procedure(s) (LRB): XI ROBOTIC ASSISTED LOWER ANTERIOR RESECTION (N/A) DIVERTING ILEOSTOMY (N/A) XI ROBOTIC ASSISTED TOTAL HYSTERECTOMY WITH BILATERAL SALPINGO OOPHORECTOMY (N/A) INDOCYANINE GREEN FLUORESCENCE IMAGING (ICG) (N/A)  Subjective: Patient reports having large amount of ostomy output yesterday. No output since 7 am. No vaginal spotting. No nausea or emesis. Wants more solid food. Felt better last night. Pt stated she was told to use vaginal applicator with cream insertion last pm. To prevent use, applicator in room thrown away.  Objective: Vital signs in last 24 hours: Temp:  [97.5 F (36.4 C)-98.7 F (37.1 C)] 98 F (36.7 C) (05/09 0517) Pulse Rate:  [102-151] 103 (05/09 0517) Resp:  [14-18] 17 (05/09 0517) BP: (118-127)/(65-77) 124/76 (05/09 0517) SpO2:  [97 %-100 %] 97 % (05/09 0517) Weight:  [154 lb 15.7 oz (70.3 kg)] 154 lb 15.7 oz (70.3 kg) (05/09 0500) Last BM Date : 04/11/23 (ostomy)  Intake/Output from previous day: 05/08 0701 - 05/09 0700 In: 1663.2 [P.O.:600; I.V.:900; IV Piggyback:163.2] Out: 5055 [Urine:900; Emesis/NG output:300; Stool:3855]  Physical Examination: Gen: NAD, alert and oriented CV: Mildly tachy in the low 100s. Regular rhythm, no murmurs or rubs Pulm: lungs clear to auscultation bilaterally, no wheezes or rhochi Abd: active bowel sounds, stoma red with output in ostomy bag. Abdominal incision intact with no active drainage Ext: warm and well perfused, no edema   Labs: Last Bmet on 04/11/23  Assessment: 42 y.o. s/p Procedure(s): XI ROBOTIC ASSISTED LOWER ANTERIOR RESECTION DIVERTING ILEOSTOMY XI ROBOTIC ASSISTED TOTAL HYSTERECTOMY WITH BILATERAL SALPINGO OOPHORECTOMY INDOCYANINE GREEN FLUORESCENCE IMAGING (ICG): stable Pain:  Pain is well-controlled on PRN medications.  Heme: Last CBC on 04/09/2023: appropriate values  ID: Last WBC on 04/09/23 at 10.2. Afebrile. No evidence of infection.  CV: BP and HR overall stable.  Mild tachycardia.  GI:  Tolerating po: Yes. Ostomy with output. Bolus ordered per CCS Team.  GU: Creatinine yesterday at 0.70. Adequate output reported.  FEN: No critical values on 04/11/23 labs. Na+ at 131.  Prophylaxis: Lovenox ordered. SCDs.  Plan: Continue plan of care per CCS Team Continue with vaginal estrogen (no applicator). Estrogen patch in place.   LOS: 8 days    Doylene Bode 04/12/2023, 7:15 AM

## 2023-04-13 ENCOUNTER — Other Ambulatory Visit (HOSPITAL_COMMUNITY): Payer: Self-pay

## 2023-04-13 MED ORDER — LOPERAMIDE HCL 2 MG PO CAPS
2.0000 mg | ORAL_CAPSULE | Freq: Two times a day (BID) | ORAL | 1 refills | Status: DC
  Filled 2023-04-13: qty 60, 30d supply, fill #0

## 2023-04-13 MED ORDER — LOPERAMIDE HCL 2 MG PO CAPS
2.0000 mg | ORAL_CAPSULE | Freq: Two times a day (BID) | ORAL | Status: DC
  Administered 2023-04-13: 2 mg via ORAL
  Filled 2023-04-13: qty 1

## 2023-04-13 NOTE — Progress Notes (Signed)
9 Days Post-Op Procedure(s) (LRB): XI ROBOTIC ASSISTED LOWER ANTERIOR RESECTION (N/A) DIVERTING ILEOSTOMY (N/A) XI ROBOTIC ASSISTED TOTAL HYSTERECTOMY WITH BILATERAL SALPINGO OOPHORECTOMY (N/A) INDOCYANINE GREEN FLUORESCENCE IMAGING (ICG) (N/A)  Subjective: Patient reports slowing of ostomy output. No nausea or emesis overnight or this am. Ambulating without difficulty. Abdomen feels soft. Had mild bloating after eating chickfila and mac and cheese but states she "walked it off." Denies chest pain, dyspnea. Voiding without difficulty. Wanting to go home.   Objective: Vital signs in last 24 hours: Temp:  [98.1 F (36.7 C)-98.7 F (37.1 C)] 98.5 F (36.9 C) (05/10 0620) Pulse Rate:  [91-103] 91 (05/10 0620) Resp:  [17-18] 18 (05/10 0620) BP: (114-120)/(64-69) 114/65 (05/10 0620) SpO2:  [98 %-100 %] 98 % (05/10 0620) Last BM Date : 04/11/23  Intake/Output from previous day: 05/09 0701 - 05/10 0700 In: 2834.6 [P.O.:680; I.V.:2154.6] Out: 2325 [Urine:1050; Stool:1275]  Physical Examination: Gen: NAD, alert and oriented CV: Regular rhythm and rate, no murmurs or rubs Pulm: lungs clear to auscultation bilaterally, no wheezes or rhochi Abd: active bowel sounds, stoma red with output in ostomy bag. Abdominal incision intact with no active drainage Ext: warm and well perfused, no edema   Labs: Last Bmet on 04/11/23  Assessment: 42 y.o. s/p Procedure(s): XI ROBOTIC ASSISTED LOWER ANTERIOR RESECTION DIVERTING ILEOSTOMY XI ROBOTIC ASSISTED TOTAL HYSTERECTOMY WITH BILATERAL SALPINGO OOPHORECTOMY INDOCYANINE GREEN FLUORESCENCE IMAGING (ICG): stable Pain:  Pain is well-controlled on PRN medications.  Heme: Last CBC on 04/09/2023: appropriate values  ID: Last WBC on 04/09/23 at 10.2. Afebrile. No evidence of infection.  CV: BP and HR overall stable. Mild tachycardia.  GI:  Tolerating po: Yes. Ostomy with output.  GU: Creatinine on 5/8 at 0.70. Adequate output reported.  FEN: No  critical values on 04/11/23 labs. Na+ at 131.  Prophylaxis: Lovenox ordered. SCDs.  Plan: Continue plan of care per CCS Team Continue with vaginal estrogen (no applicator). Estrogen patch in place. Cleared for discharge from GYN ONC standpoint   LOS: 9 days    Doylene Bode 04/13/2023, 7:23 AM

## 2023-04-13 NOTE — Progress Notes (Signed)
Patient discharged to home w/ family. Given all belongings, instructions, materials. Verbalized understanding and confidence in d/c instructions and ostomy care. Escorted to pov via w/c.

## 2023-04-13 NOTE — Discharge Summary (Signed)
Patient ID: Terri Mckinney MRN: [id number] DOB/AGE: 1981-05-11 42 y.o.  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 She had a slow and steady recovery following. Ultimately her ostomy output picked up and her nausea and bloating resolved. Her diet was gradually advanced. Initial ostomy output quite high but came down to 1.275L / 24 hrs once on a regular diet and minimizing concentrated sweets. Fibercon tablets started. She was comforatble with managing her ostomy. Postoperative expectations were discussed at length including monitoring ostomy output and 24 hr totals - to reach out to [place] if her 24  hr output is >1.2L. Discussed importance of hydration. She was also prescribed BID imodium to be taken on discharge. She is comfortable with and stable for discharge home today. Follow-up with Dr. Maisie Fus & Pricilla Holm arranged.    Allergies as of 04/13/2023       Reactions   Wellbutrin [bupropion]    insomnia   Tramadol Rash   Mainly in chest and neck area        Medication List     STOP taking these medications    medroxyPROGESTERone 150 MG/ML injection Commonly known as: DEPO-PROVERA       TAKE these medications    DentaGel 1.1 % Gel dental gel Generic drug: sodium fluoride Place 1 Application onto teeth at  bedtime.   Eliquis 2.5 MG Tabs tablet Generic drug: apixaban Take 1 tablet (2.5 mg total) by mouth 2 (two) times daily.   estradiol 0.1 mg/24hr patch Commonly known as: Climara Place 1 patch (0.1 mg total) onto the skin once a week. Start one week after surgery   estradiol 0.1 MG/GM vaginal cream Commonly known as: ESTRACE VAGINAL 1 week after surgery, begin to use cream nightly at bedtime for 2 weeks then 3 times a week after to assist with healing vagina. Use a fingertip size amount of cream on finger and place slightly past vaginal entrance. Do not use the applicator that comes with cream.   loperamide 2 MG capsule Commonly known as: IMODIUM Take 1 capsule (2 mg total) by mouth 2 (two) times daily.   multivitamin tablet Take 1 tablet by mouth daily.   oxyCODONE 5 MG immediate release tablet Commonly known as: Oxy IR/ROXICODONE Take 1 tablet (5 mg total) by mouth every 6 (six) hours as needed for severe pain.   vitamin B-12 250 MCG tablet Commonly known as: CYANOCOBALAMIN Take 250 mcg by mouth daily.          Follow-up Information     Carver Fila, MD Follow up on 04/27/2023.   Specialty: Gynecologic Oncology Why: at 3:30 pm at the [facility] for post-op check with Dr. Percival Spanish information: [address] [place] [address] [phone]         Romie Levee, MD. Schedule an appointment as soon as possible for a visit in 3 week(s).   Specialties: General Surgery,  Colon and Rectal Surgery Contact information: [address] [place] [address] [phone]                 Stephanie Coup. Cliffton Asters, M.D. [facility]

## 2023-04-13 NOTE — Progress Notes (Signed)
9 Days Post-Op Robotic LAR w/ DLI, TAH/BSO Subjective: Tolerated soft diet without trouble.  Ostomy functioning.  Ambulating.  No n/v  Objective: Vital signs in last 24 hours: Temp:  [98.1 F (36.7 C)-98.7 F (37.1 C)] 98.5 F (36.9 C) (05/10 0620) Pulse Rate:  [91-103] 91 (05/10 0620) Resp:  [17-18] 18 (05/10 0620) BP: (114-120)/(64-69) 114/65 (05/10 0620) SpO2:  [98 %-100 %] 98 % (05/10 0620)   Intake/Output from previous day: 05/09 0701 - 05/10 0700 In: 2834.6 [P.O.:680; I.V.:2154.6] Out: 2325 [Urine:1050; Stool:1275] Intake/Output this shift: No intake/output data recorded.   General appearance: alert and cooperative GI: soft, non-tender; Ostomy: beefy red Incision: no significant drainage  Lab Results:  No results for input(s): "WBC", "HGB", "HCT", "PLT" in the last 72 hours. BMET Recent Labs    04/11/23 0450  NA 131*  K 3.5  CL 93*  CO2 26  GLUCOSE 116*  BUN 17  CREATININE 0.70  CALCIUM 8.9   PT/INR No results for input(s): "LABPROT", "INR" in the last 72 hours. ABG No results for input(s): "PHART", "HCO3" in the last 72 hours.  Invalid input(s): "PCO2", "PO2"  MEDS, Scheduled  enoxaparin (LOVENOX) injection  40 mg Subcutaneous Q24H   estradiol  0.075 mg Transdermal Weekly   estradiol  1 Applicatorful Vaginal QHS   lip balm   Topical BID   loperamide  2 mg Oral BID   polycarbophil  625 mg Oral BID    Studies/Results: No results found.  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  Pt used to ostomy but may need help transitioning to ileostomy  She is comfortable with ostomy and care. She is comfortable with discharge home today  Given recent pelvic surgery, cancer diagnosis and new estrogen replacement, I recommend continued dvt prophylaxis for 30 days after surgery.  Rx is at [facility] pharmacy awaiting her discharge     LOS: 9 days   Marin Olp, MD [facility]  04/13/2023 8:14 AM

## 2023-04-15 ENCOUNTER — Other Ambulatory Visit: Payer: Self-pay

## 2023-04-16 ENCOUNTER — Telehealth: Payer: Self-pay

## 2023-04-16 ENCOUNTER — Telehealth: Payer: Self-pay | Admitting: *Deleted

## 2023-04-16 NOTE — Telephone Encounter (Signed)
Spoke with Terri Mckinney this morning. She states she is eating, drinking and urinating well. She has had a BM and is passing gas. She is taking imodium as prescribed and encouraged her to drink plenty of water. She denies fever or chills. Incisions are dry and intact. She rates her pain 4/10. Her pain is controlled with tylenol.    Instructed to call office with any fever, chills, purulent drainage, uncontrolled pain or any other questions or concerns. Patient verbalizes understanding.   Pt aware of post op appointments as well as the office number [phone] and after hours number [phone] to call if she has any questions or concerns

## 2023-04-16 NOTE — Transitions of Care (Post Inpatient/ED Visit) (Signed)
04/16/2023  Name: Terri Mckinney MRN: [id number] DOB: [birth year]  Today's TOC FU Call Status: Today's TOC FU Call Status:: Successful TOC FU Call Competed TOC FU Call Complete Date: 04/16/23  Transition Care Management Follow-up Telephone Call Date of Discharge: 04/13/23 Discharge Facility: [facility]) Type of Discharge: Inpatient Admission Primary Inpatient Discharge Diagnosis:: Dr Pricilla Holm GYN oncology How have you been since you were released from the hospital?: Better Any questions or concerns?: No  Items Reviewed: Did you receive and understand the discharge instructions provided?: Yes Medications obtained,verified, and reconciled?: Yes (Medications Reviewed) Any new allergies since your discharge?: No Dietary orders reviewed?: Yes  Medications Reviewed Today: Medications Reviewed Today     Reviewed by Karena Addison, LPN (Licensed Practical Nurse) on 04/16/23 at 1514  Med List Status: <None>   Medication Order Taking? Sig Documenting Provider Last Dose Status Informant  apixaban (ELIQUIS) 2.5 MG TABS tablet [id number] Yes Take 1 tablet (2.5 mg total) by mouth 2 (two) times daily. Romie Levee, MD Taking Active   DENTAGEL 1.1 % GEL dental gel [id number] No Place 1 Application onto teeth at bedtime.  Patient not taking: Reported on 04/16/2023   [provider] Not Taking Active Self           Med Note Hart Rochester, Lafe Garin   Tue Mar 27, 2023  1:16 PM) Has not started yet  estradiol (CLIMARA) 0.1 mg/24hr patch [id number] Yes Place 1 patch (0.1 mg total) onto the skin once a week. Start one week after surgery Cross, Laurene Footman, NP Taking Active   estradiol (ESTRACE VAGINAL) 0.1 MG/GM vaginal cream [id number] Yes 1 week after surgery, begin to use cream nightly at bedtime for 2 weeks then 3 times a week after to assist with healing vagina. Use a fingertip size amount of cream on finger and place slightly past vaginal entrance. Do not use the applicator that comes with  cream. Cross, Melissa D, NP Taking Active   loperamide (IMODIUM) 2 MG capsule [id number] Yes Take 1 capsule (2 mg total) by mouth 2 (two) times daily. Andria Meuse, MD Taking Active   medroxyPROGESTERone (DEPO-PROVERA) injection 150 mg [id number]   Junie Spencer, FNP  Active   Multiple Vitamin (MULTIVITAMIN) tablet [id number] Yes Take 1 tablet by mouth daily. [provider] Taking Active Self  oxyCODONE (OXY IR/ROXICODONE) 5 MG immediate release tablet [id number] Yes Take 1 tablet (5 mg total) by mouth every 6 (six) hours as needed for severe pain. Romie Levee, MD Taking Active     Discontinued 08/07/22 2158   vitamin B-12 (CYANOCOBALAMIN) 250 MCG tablet [id number] Yes Take 250 mcg by mouth daily. [provider] Taking Active Self  Med List Note Otis Peak, [facility] 11/16/22 1202): Xeloda filled through [facility]            Home Care and Equipment/Supplies: Were Home Health Services Ordered?: Yes Name of Home Health Agency:: patient declined Has Agency set up a time to come to your home?: No Any new equipment or medical supplies ordered?: NA  Functional Questionnaire: Do you need assistance with bathing/showering or dressing?: No Do you need assistance with meal preparation?: No Do you need assistance with eating?: No Do you have difficulty maintaining continence: No Do you need assistance with getting out of bed/getting out of a chair/moving?: No Do you have difficulty managing or taking your medications?: No  Follow up appointments reviewed: PCP Follow-up appointment confirmed?: NA Specialist Hospital Follow-up appointment confirmed?: Yes Date of Specialist  follow-up appointment?: 04/27/23 Follow-Up Specialty Provider:: Dr Pricilla Holm Do you need transportation to your follow-up appointment?: No Do you understand care options if your condition(s) worsen?: Yes-patient verbalized understanding    SIGNATURE Karena Addison, LPN [facility]  Nurse Health Advisor Direct Dial [phone]

## 2023-04-27 ENCOUNTER — Encounter: Payer: Self-pay | Admitting: Gynecologic Oncology

## 2023-04-27 ENCOUNTER — Inpatient Hospital Stay: Payer: BC Managed Care – PPO | Attending: Hematology | Admitting: Gynecologic Oncology

## 2023-04-27 VITALS — BP 122/62 | HR 110 | Temp 98.4°F | Resp 16 | Ht 68.9 in | Wt 153.8 lb

## 2023-04-27 DIAGNOSIS — E8940 Asymptomatic postprocedural ovarian failure: Secondary | ICD-10-CM

## 2023-04-27 DIAGNOSIS — C20 Malignant neoplasm of rectum: Secondary | ICD-10-CM

## 2023-04-27 DIAGNOSIS — Z90710 Acquired absence of both cervix and uterus: Secondary | ICD-10-CM

## 2023-04-27 DIAGNOSIS — Z90722 Acquired absence of ovaries, bilateral: Secondary | ICD-10-CM

## 2023-04-27 DIAGNOSIS — Z124 Encounter for screening for malignant neoplasm of cervix: Secondary | ICD-10-CM

## 2023-04-27 NOTE — Progress Notes (Unsigned)
Gynecologic Oncology Return Clinic Visit  04/27/23  Reason for Visit: follow-up  Treatment History: Oncology History  Rectal cancer (HCC)  03/28/2022 Initial Diagnosis   Rectal cancer (HCC)   05/29/2022 - 07/26/2022 Chemotherapy   Patient is on Treatment Plan : COLORECTAL FOLFOXIRI + Bevacizumab q14d     05/29/2022 -  Chemotherapy   Patient is on Treatment Plan : COLORECTAL FOLFOXIRI + Bevacizumab q14d      Genetic Testing   No pathogenic variants identified on the [facility] CustomNext+RNA panel. The report date is 09/08/2022.  The CustomNext-Cancer+RNAinsight panel offered by [employer] includes sequencing and rearrangement analysis for the following 47 genes:  APC, ATM, AXIN2, BARD1, BMPR1A, BRCA1, BRCA2, BRIP1, CDH1, CDK4, CDKN2A, CHEK2, DICER1, EPCAM, GREM1, HOXB13, MEN1, MLH1, MSH2, MSH3, MSH6, MUTYH, NBN, NF1, NF2, NTHL1, PALB2, PMS2, POLD1, POLE, PTEN, RAD51C, RAD51D, RECQL, RET, SDHA, SDHAF2, SDHB, SDHC, SDHD, SMAD4, SMARCA4, STK11, TP53, TSC1, TSC2, and VHL.  RNA data is routinely analyzed for use in variant interpretation for all genes.    04/04/23: Robotic-assisted laparoscopic total hysterectomy with bilateral salpingo-oophorectomy, omental J-flap to cover vaginal cuff  XI ROBOTIC ASSISTED LOWER ANTERIOR RESECTION DIVERTING ILEOSTOMY INDOCYANINE GREEN FLUORESCENCE IMAGING (IC[facility])  Interval History: Doing well.  Denies any vaginal bleeding.  Has had small amount of bleeding from around her ostomy.  Endorses some anal/rectal pain.  Denies any urinary symptoms.  Been doing well with estrogen patch, fell off once this week after showering, denies any other issues with it sticking.  Denies any hot flashes or other menopausal symptoms.  Has been using the vaginal estrogen.  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   Port-A-Cath in place 05/24/2022   upper Rt chest    Tobacco abuse     Past Surgical History:  Procedure Laterality Date   BIOPSY  03/21/2022   Procedure: BIOPSY;  Surgeon: Lanelle Bal, DO;  Location: [facility];  Service: Endoscopy;;   COLONOSCOPY WITH PROPOFOL N/A 03/21/2022   Procedure: COLONOSCOPY WITH PROPOFOL;  Surgeon: Lanelle Bal, DO;  Location: [facility];  Service: Endoscopy;  Laterality: N/A;  8:30am   FLEXIBLE SIGMOIDOSCOPY N/A 05/19/2022   Procedure: FLEXIBLE SIGMOIDOSCOPY WITH TATTOO INJECTION;  Surgeon: Romie Levee, MD;  Location: [facility];  Service: General;  Laterality: N/A;   ILEOSTOMY N/A 04/04/2023   Procedure: DIVERTING ILEOSTOMY;  Surgeon: Romie Levee, MD;  Location: [facility];  Service: General;  Laterality: N/A;   LAPAROSCOPIC DIVERTED COLOSTOMY N/A 05/19/2022   Procedure: LAPAROSCOPIC DIVERTED OSTOMY;  Surgeon: Romie Levee, MD;  Location: [facility];  Service: General;  Laterality: N/A;   PORTACATH PLACEMENT Right 05/19/2022   Procedure: INSERTION PORT-A-CATH;  Surgeon: Romie Levee, MD;  Location: [facility];  Service: General;  Laterality: Right;   ROBOTIC ASSISTED TOTAL HYSTERECTOMY WITH BILATERAL SALPINGO OOPHERECTOMY N/A 04/04/2023   Procedure: XI ROBOTIC ASSISTED TOTAL HYSTERECTOMY WITH BILATERAL SALPINGO OOPHORECTOMY;  Surgeon: Carver Fila, MD;  Location: [facility];  Service: Gynecology;  Laterality: N/A;   XI ROBOTIC ASSISTED LOWER ANTERIOR RESECTION N/A 04/04/2023   Procedure: XI ROBOTIC ASSISTED LOWER ANTERIOR RESECTION;  Surgeon: Romie Levee, MD;  Location: [facility];  Service: General;  Laterality: N/A;    Family History  Problem Relation Age of Onset   Heart disease Mother    Diabetes Mother    Hearing loss Father    Heart disease Father  Hyperlipidemia Father    Hypertension Father    Stroke Father    Arthritis Father    Diabetes Father    Learning disabilities Brother    Diabetes Maternal Grandmother    Heart disease Maternal Grandmother    Diabetes Maternal Grandfather     Diabetes Paternal Grandmother    Diabetes Paternal Grandfather    Leukemia Maternal Uncle        d. 60s   Colon cancer Paternal Aunt        dx 89s   Breast cancer Cousin 41   Lung cancer Maternal Aunt     Social History   Socioeconomic History   Marital status: Married    Spouse name: Not on file   Number of children: Not on file   Years of education: Not on file   Highest education level: Not on file  Occupational History   Not on file  Tobacco Use   Smoking status: Every Day    Packs/day: 0.25    Years: 27.00    Additional pack years: 0.00    Total pack years: 6.75    Types: Cigarettes   Smokeless tobacco: Never  Vaping Use   Vaping Use: Never used  Substance and Sexual Activity   Alcohol use: No   Drug use: No   Sexual activity: Yes    Birth control/protection: Injection  Other Topics Concern   Not on file  Social History Narrative   Not on file   Social Determinants of Health   Financial Resource Strain: Not on file  Food Insecurity: No Food Insecurity (04/04/2023)   Hunger Vital Sign    Worried About Running Out of Food in the Last Year: Never true    Ran Out of Food in the Last Year: Never true  Transportation Needs: No Transportation Needs (04/04/2023)   PRAPARE - Administrator, Civil Service (Medical): No    Lack of Transportation (Non-Medical): No  Physical Activity: Not on file  Stress: Not on file  Social Connections: Not on file    Current Medications:  Current Outpatient Medications:    apixaban (ELIQUIS) 2.5 MG TABS tablet, Take 1 tablet (2.5 mg total) by mouth 2 (two) times daily., Disp: 60 tablet, Rfl: 0   DENTAGEL 1.1 % GEL dental gel, Place 1 Application onto teeth at bedtime. (Patient not taking: Reported on 04/16/2023), Disp: , Rfl:    estradiol (CLIMARA) 0.1 mg/24hr patch, Place 1 patch (0.1 mg total) onto the skin once a week. Start one week after surgery, Disp: 4 patch, Rfl: 6   estradiol (ESTRACE VAGINAL) 0.1 MG/GM vaginal  cream, 1 week after surgery, begin to use cream nightly at bedtime for 2 weeks then 3 times a week after to assist with healing vagina. Use a fingertip size amount of cream on finger and place slightly past vaginal entrance. Do not use the applicator that comes with cream., Disp: 42.5 g, Rfl: 3   loperamide (IMODIUM) 2 MG capsule, Take 1 capsule (2 mg total) by mouth 2 (two) times daily., Disp: 60 capsule, Rfl: 1   Multiple Vitamin (MULTIVITAMIN) tablet, Take 1 tablet by mouth daily., Disp: , Rfl:    oxyCODONE (OXY IR/ROXICODONE) 5 MG immediate release tablet, Take 1 tablet (5 mg total) by mouth every 6 (six) hours as needed for severe pain., Disp: 20 tablet, Rfl: 0   vitamin B-12 (CYANOCOBALAMIN) 250 MCG tablet, Take 250 mcg by mouth daily., Disp: , Rfl:   Current Facility-Administered Medications:  medroxyPROGESTERone (DEPO-PROVERA) injection 150 mg, 150 mg, Intramuscular, Q90 days, Jannifer Rodney A, FNP, 150 mg at 09/21/22 1619  Review of Systems: Denies appetite changes, fevers, chills, fatigue, unexplained weight changes. Denies hearing loss, neck lumps or masses, mouth sores, ringing in ears or voice changes. Denies cough or wheezing.  Denies shortness of breath. Denies chest pain or palpitations. Denies leg swelling. Denies abdominal distention, pain, blood in stools, constipation, diarrhea, nausea, vomiting, or early satiety. Denies pain with intercourse, dysuria, frequency, hematuria or incontinence. Denies hot flashes, pelvic pain, vaginal bleeding or vaginal discharge.   Denies joint pain, back pain or muscle pain/cramps. Denies itching, rash, or wounds. Denies dizziness, headaches, numbness or seizures. Denies swollen lymph nodes or glands, denies easy bruising or bleeding. Denies anxiety, depression, confusion, or decreased concentration.  Physical Exam: BP 122/62 (BP Location: Left Arm, Patient Position: Sitting)   Pulse (!) 110   Temp 98.4 F (36.9 C) (Oral)   Resp 16    Ht 5' 8.9" (1.75 m)   Wt 153 lb 12.8 oz (69.8 kg)   SpO2 100%   BMI 22.78 kg/m  General: Alert, oriented, no acute distress. HEENT: Normocephalic, atraumatic, sclera anicteric. Chest: Unlabored breathing on room air. Abdomen: soft, nontender.  Normoactive bowel sounds.  Stoma bag intact, pink and viable.  Well-healed visions, remaining Dermabond removed. Extremities: Grossly normal range of motion.  Warm, well perfused.  No edema bilaterally. GU: Normal appearing external genitalia without erythema, excoriation, or lesions.  Speculum exam reveals mildly atrophic vaginal cuff, suture visible.  Cuff intact, no bleeding or discharge.  Bimanual exam reveals cuff intact, no fluctuance or tenderness with palpation.   Laboratory & Radiologic Studies: A. RECTOSIGMOID COLON, UTERUS, CERVIX, BILATERAL FALLOPIAN TUBES AND OVARIES RESECT      Rectosigmoid colon: Invasive adenocarcinoma, moderately differentiated, with treatment effect.      Tumor size: 1.5 cm in maximal dimension.      Tumor invades cervix through the serosal adhesion.      No lymphovascular invasion identified.      Perineural invasion identified.      Surgical margins of resection are negative for carcinoma.           Closest margin: 0.5 cm to mesorectal margin.      Twelve lymph nodes, negative for metastatic carcinoma (0/12).      Treatment response: Partial response, Score 2.      See oncology table.  Mesorectum complete. One separated hyperplastic polyp.      Enterocutaneous tissue consistent with ostomy site changes.      Pigmented tattoo site identified.      Findings in other organs:      Cervix: Involved by adenocarcinoma.           Negative for dysplasia. Endometrium:           Benign inactive endometrium.           Negative for hyperplasia or malignancy.      Myometrium:           Unremarkable.           Negative for malignancy. Serosa:           Unremarkable.           Negative for malignancy. Right  ovary:           Benign Brenner tumor (4 mm).           Negative for malignancy.      Left ovary:  Unremarkable.           Negative for malignancy.      Bilateral fallopian tubes:           Benign fimbriated fallopian tubes.           Negative for malignancy.  B. ANASTOMOSIS RING, EXCISION:      Segment of colon without significant diagnostic alteration.      Negative for dysplasia or malignancy.  ONCOLOGY TABLE:    COLON AND RECTUM, CARCINOMA:  Resection, Including Transanal Disk Excision of Rectal Neoplasms  Procedure: En bloc resection Tumor Site: Rectosigmoid colon Tumor Size: 1.5 cm in maximal dimension Macroscopic Tumor Perforation: Not identified Macroscopic Evaluation of Mesorectum (required for rectal cancer): Complete Histologic Type: Invasive adenocarcinoma Histologic Grade: Grade 2, moderately differentiated Multiple Primary Sites: Not applicable Tumor Extension: Directly extends to cervix through serosal adhesion Lymphovascular Invasion: Not identified Perineural Invasion: Perineural invasive Treatment Effect: Partial response, Score 2. Margins:      Margin Status for Invasive Carcinoma: All margins negative for invasive carcinoma      Distance from Invasive Carcinoma to Radial (Circumferential) Margin (required for rectal           tumors): 0.5 cm      Distance from Invasive Carcinoma to Closest Mucosal Margin (relevant and required only for           transanal disc excisions): Not applicable (not a transanal disc excision)      Margin Status for Non-Invasive Tumor: All margins negative for high-grade dysplasia / intramucosal           carcinoma and low-grade dysplasia Regional Lymph Nodes:      Number of Lymph Nodes with Tumor: 0      Number of Lymph Nodes Examined: 12 Tumor Deposits: Not identified Distant Metastasis:      Distant Site(s) Involved: Liver Pathologic Stage Classification (pTNM, AJCC 8th Edition): ypT4b,  ypN0, ypM1a. Ancillary Studies: MMR / MSI testing will be ordered. Representative Tumor Block: A9 Comments: None ([id number])  Assessment & Plan: Terri Mckinney is a 42 y.o. woman with rectal adenocarcinoma status post neoadjuvant chemotherapy followed by chemo RT now status post debulking surgery.  Total hysterectomy with BSO performed at the same time given posterior cervical involvement by her adenocarcinoma.  The patient is overall doing well, meeting postoperative milestones.  Discussed continued expectations and restrictions.  Specifically, in the setting of her prior radiation, we discussed continuing vaginal estrogen for 10 weeks after surgery and placing nothing in the vagina for at least 3 months.  Given surgical menopause, the patient was started on hormone replacement therapy.  She is doing well on her current patch without menopausal symptoms.  I will send my note to her primary care provider to see if this can be something followed with her PCP.  Otherwise, she will need referral to OB/GYN for continued annual surveillance.  20 minutes of total time was spent for this patient encounter, including preparation, face-to-face counseling with the patient and coordination of care, and documentation of the encounter.  Eugene Garnet, MD  Division of Gynecologic Oncology  Department of Obstetrics and Gynecology  [facility]

## 2023-04-27 NOTE — Patient Instructions (Signed)
It was good to see you today.  You are healing well from 3.  Please keep using the vaginal estrogen cream 3 times a week at night until 10 weeks after surgery.  No heavy lifting for at least 6 weeks.  Nothing in the vagina for at least 3 months.  Because of your prior radiation, we know that this tissue may heal more slowly and rarely want everything to heal well before we stressed that incision.  Please let me know if you have any trouble with the estrogen patch sticking.  I will send my note to your primary care provider to see if they would follow you moving forward.  Otherwise, we can look for an OB/GYN to have you see on an annual basis.  Please call me if you have any significant vaginal bleeding, pelvic pain, or other symptoms as you recover over these next few months.

## 2023-05-01 ENCOUNTER — Encounter: Payer: Self-pay | Admitting: Hematology

## 2023-05-17 ENCOUNTER — Other Ambulatory Visit: Payer: Self-pay | Admitting: Gynecologic Oncology

## 2023-05-17 ENCOUNTER — Other Ambulatory Visit (HOSPITAL_COMMUNITY): Payer: Self-pay

## 2023-05-17 DIAGNOSIS — C787 Secondary malignant neoplasm of liver and intrahepatic bile duct: Secondary | ICD-10-CM

## 2023-05-17 DIAGNOSIS — E8940 Asymptomatic postprocedural ovarian failure: Secondary | ICD-10-CM

## 2023-05-17 MED ORDER — ESTRADIOL 0.1 MG/24HR TD PTWK
0.1000 mg | MEDICATED_PATCH | TRANSDERMAL | 6 refills | Status: DC
Start: 2023-05-17 — End: 2023-10-08

## 2023-05-28 ENCOUNTER — Inpatient Hospital Stay: Payer: BC Managed Care – PPO | Attending: Hematology

## 2023-05-28 DIAGNOSIS — C20 Malignant neoplasm of rectum: Secondary | ICD-10-CM | POA: Insufficient documentation

## 2023-05-28 LAB — CBC WITH DIFFERENTIAL/PLATELET
Abs Immature Granulocytes: 0.02 10*3/uL (ref 0.00–0.07)
Basophils Absolute: 0.1 10*3/uL (ref 0.0–0.1)
Basophils Relative: 1 %
Eosinophils Absolute: 0.4 10*3/uL (ref 0.0–0.5)
Eosinophils Relative: 6 %
HCT: 36.8 % (ref 36.0–46.0)
Hemoglobin: 12 g/dL (ref 12.0–15.0)
Immature Granulocytes: 0 %
Lymphocytes Relative: 17 %
Lymphs Abs: 1.2 10*3/uL (ref 0.7–4.0)
MCH: 30.6 pg (ref 26.0–34.0)
MCHC: 32.6 g/dL (ref 30.0–36.0)
MCV: 93.9 fL (ref 80.0–100.0)
Monocytes Absolute: 0.5 10*3/uL (ref 0.1–1.0)
Monocytes Relative: 8 %
Neutro Abs: 4.7 10*3/uL (ref 1.7–7.7)
Neutrophils Relative %: 68 %
Platelets: 324 10*3/uL (ref 150–400)
RBC: 3.92 MIL/uL (ref 3.87–5.11)
RDW: 14.3 % (ref 11.5–15.5)
WBC: 6.9 10*3/uL (ref 4.0–10.5)
nRBC: 0 % (ref 0.0–0.2)

## 2023-05-28 LAB — COMPREHENSIVE METABOLIC PANEL
ALT: 26 U/L (ref 0–44)
AST: 13 U/L — ABNORMAL LOW (ref 15–41)
Albumin: 3.8 g/dL (ref 3.5–5.0)
Alkaline Phosphatase: 82 U/L (ref 38–126)
Anion gap: 8 (ref 5–15)
BUN: 10 mg/dL (ref 6–20)
CO2: 25 mmol/L (ref 22–32)
Calcium: 9.1 mg/dL (ref 8.9–10.3)
Chloride: 103 mmol/L (ref 98–111)
Creatinine, Ser: 1.02 mg/dL — ABNORMAL HIGH (ref 0.44–1.00)
GFR, Estimated: >60 mL/min (ref >60)
Glucose, Bld: 83 mg/dL (ref 70–99)
Potassium: 3.9 mmol/L (ref 3.5–5.1)
Sodium: 136 mmol/L (ref 135–145)
Total Bilirubin: 0.4 mg/dL (ref 0.3–1.2)
Total Protein: 7.2 g/dL (ref 6.5–8.1)

## 2023-05-28 LAB — MAGNESIUM: Magnesium: 2 mg/dL (ref 1.7–2.4)

## 2023-06-03 NOTE — Progress Notes (Signed)
[facility] [address] [place] [address]    Clinic Day:  06/04/2023  Referring physician: Junie Spencer, FNP  Patient Care Team: Terri Spencer, FNP as PCP - General (Family Medicine) Terri Massed, MD as Medical Oncologist (Medical Oncology) Terri Sarah, RN as Oncology Nurse Navigator (Medical Oncology)   ASSESSMENT & PLAN:   Assessment: 1. 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  Stools have become bloody/mucousy lately. - She also reported pain in the tailbone region since March 2023.  She also has right-sided lower back pain. - 50 pound weight loss in the last 9 months, part of weight loss was intentional.  She cut back on eating sweets and lost taste to sweets after COVID infection. - CT [facility] with contrast on 03/08/2022: Irregular circumferential masslike wall thickening of sigmoid colon/rectum with adjacent perirectal adenopathy.  Multiple small hypodense lesions in the liver, largest 2.9 cm in the central aspect of the liver, question metastatic disease. - Colonoscopy on 03/21/2022 by Dr. Marletta Lor: Fungating infiltrative nearly completely obstructing mass in the rectosigmoid colon, mass was circumferential measuring 4 cm in length. - Pathology: Rectal mass biopsy consistent with invasive moderately differentiated adenocarcinoma.  As there is very scant invasive tumor, MSI studies were deferred. - PET scan (03/23/2022): Hypermetabolic rectal primary long-segment with SUV 14.3.  Left posterior perirectal lymph node 7 mm with SUV 2.9.  Multiple tiny foci of hepatic hypermetabolism. - MRI of the liver (04/01/2022): Multiple small hypovascular rim-enhancing liver lesions, predominantly in the right hepatic lobe measuring up to 1.1 cm.  3.3 cm hypervascular mass in the central liver, most consistent with FNH/hepatic adenoma. - Liver biopsy (04/20/2022): Metastatic moderately  differentiated colonic adenocarcinoma with mucinous features - NGS testing shows K-ras G12 R mutation, PIK3CA exon 21 mutation.  MS-stable.  TMB-low. - Cycle 1 of FOLFOXIRI on 05/29/2022, bevacizumab added during cycle 2 -MRI pelvis (10/23/2022): Known metastatic rectal cancer with persistent rectal narrowing and signs of involvement of posterior cervical stroma and anterior peritoneal reflection.  There is evidence of residual tumor in the posterior cervix.  Persistent presacral edema not substantially changed. - MRI liver (11/10/2022): 3 benign lesions and previous small rim-enhancing metastatic lesions in the liver are no longer seen. - Chemoradiation therapy with Xeloda from 12/11/2022 through 01/18/2023. - 04/04/2023: Robotic assisted LAR, diverting ileostomy, TAH with BSO - Pathology: YpT4b, ypN0, YPM1, 0/12 lymph nodes involved, no tumor deposits identified, margins negative, LVI negative, PNI positive, 1.5 cm grade 2 adenocarcinoma in the rectosigmoid colon, no perforation, MMR preserved.   2. Social/family history: - She is separated and is seen today with her daughter.  She works as a Pensions consultant at [employer].  She is current active smoker, 1 pack/day for 27 years.  Denies drinking alcohol. - Paternal aunt had colon cancer.  Maternal cousin has breast cancer.  Maternal uncle had leukemia and maternal cousin had leukemia.    Plan: Metastatic rectal cancer to the liver: - She underwent LAR with diverting ileostomy on 04/04/2023 and has been recovering very well. - She reports good appetite.  No new onset pains. - I have reviewed pathology reports in detail. - Recommend restaging CT CAP with contrast along with CEA and routine labs. - Depending on the CT scan findings, would also recommend MRI of the liver.  2.  Diarrhea: - Take Imodium as needed.  3.  Peripheral neuropathy: - Numbness in  the fingertips and feet is stable.    Orders Placed This Encounter  Procedures   CT CHEST ABDOMEN PELVIS  W CONTRAST    Standing Status:   Future    Standing Expiration Date:   06/03/2024    Order Specific Question:   If indicated for the ordered procedure, I authorize the administration of contrast media per Radiology protocol    Answer:   Yes    Order Specific Question:   Does the patient have a contrast media/X-ray dye allergy?    Answer:   No    Order Specific Question:   Is patient pregnant?    Answer:   No    Order Specific Question:   Preferred imaging location?    Answer:   [facility]    Order Specific Question:   Release to patient    Answer:   Immediate    Order Specific Question:   If indicated for the ordered procedure, I authorize the administration of oral contrast media per Radiology protocol    Answer:   Yes   CBC with Differential    Standing Status:   Future    Standing Expiration Date:   06/03/2024   Comprehensive metabolic panel    Standing Status:   Future    Standing Expiration Date:   06/03/2024   CEA    Standing Status:   Future    Standing Expiration Date:   06/03/2024      I,Terri Mckinney,acting as a scribe for Terri Massed, MD.,have documented all relevant documentation on the behalf of Terri Massed, MD,as directed by  Terri Massed, MD while in the presence of Terri Massed, MD.   I, Terri Massed MD, have reviewed the above documentation for accuracy and completeness, and I agree with the above.   Terri Massed, MD   7/1/20245:31 PM  CHIEF COMPLAINT:   Diagnosis: metastatic rectal cancer to the liver    Cancer Staging  Rectal cancer [facility]) Staging form: Colon and Rectum, AJCC 8th Edition - Clinical stage from 03/28/2022: Stage IVA (cTX, cN1, cM1a) - Unsigned    Prior Therapy: 1. FOLFOXIRI and bevacizumab, 13 cycles, 05/29/22 - 11/15/22  2. Xeloda with radiation therapy, 12/11/22 - 01/18/23  Current Therapy: Observation   HISTORY OF PRESENT ILLNESS:   Oncology History  Rectal cancer (HCC)  03/28/2022  Initial Diagnosis   Rectal cancer (HCC)   05/29/2022 - 07/26/2022 Chemotherapy   Patient is on Treatment Plan : COLORECTAL FOLFOXIRI + Bevacizumab q14d     05/29/2022 -  Chemotherapy   Patient is on Treatment Plan : COLORECTAL FOLFOXIRI + Bevacizumab q14d      Genetic Testing   No pathogenic variants identified on the [facility] CustomNext+RNA panel. The report date is 09/08/2022.  The CustomNext-Cancer+RNAinsight panel offered by [facility] includes sequencing and rearrangement analysis for the following 47 genes:  APC, ATM, AXIN2, BARD1, BMPR1A, BRCA1, BRCA2, BRIP1, CDH1, CDK4, CDKN2A, CHEK2, DICER1, EPCAM, GREM1, HOXB13, MEN1, MLH1, MSH2, MSH3, MSH6, MUTYH, NBN, NF1, NF2, NTHL1, PALB2, PMS2, POLD1, POLE, PTEN, RAD51C, RAD51D, RECQL, RET, SDHA, SDHAF2, SDHB, SDHC, SDHD, SMAD4, SMARCA4, STK11, TP53, TSC1, TSC2, and VHL.  RNA data is routinely analyzed for use in variant interpretation for all genes.      INTERVAL HISTORY:   Terri Mckinney is a 42 y.o. female presenting to clinic today for follow up of metastatic rectal cancer to the liver. She was last seen by me on 02/14/23.  Since her last visit, she underwent lower anterior resection  with diverting ileostomy, as well as hysterectomy with BSO, on 04/04/23 under Dr. Maisie Fus. Pathology from the rectosigmoid colon showed: partial treatment response-- 1.5 cm tumor invading cervix through serosal adhesion; resection margins negative; all 12 lymph nodes negative (0/12).  Today, she states that she is doing well overall. Her appetite level is at 100%. Her energy level is at 90%.  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   Port-A-Cath in place 05/24/2022   upper Rt chest   Tobacco abuse     Surgical History: Past Surgical History:  Procedure Laterality Date   BIOPSY  03/21/2022   Procedure: BIOPSY;  Surgeon: Lanelle Bal, DO;  [facility];  Service: Endoscopy;;   COLONOSCOPY WITH PROPOFOL N/A 03/21/2022   Procedure: COLONOSCOPY WITH PROPOFOL;  Surgeon: Lanelle Bal, DO;  Location: [facility];  Service: Endoscopy;  Laterality: N/A;  8:30am   FLEXIBLE SIGMOIDOSCOPY N/A 05/19/2022   Procedure: FLEXIBLE SIGMOIDOSCOPY WITH TATTOO INJECTION;  Surgeon: Romie Levee, MD;  Location: [facility];  Service: General;  Laterality: N/A;   ILEOSTOMY N/A 04/04/2023   Procedure: DIVERTING ILEOSTOMY;  Surgeon: Romie Levee, MD;  Location: [facility];  Service: General;  Laterality: N/A;   LAPAROSCOPIC DIVERTED COLOSTOMY N/A 05/19/2022   Procedure: LAPAROSCOPIC DIVERTED OSTOMY;  Surgeon: Romie Levee, MD;  Location: [facility];  Service: General;  Laterality: N/A;   PORTACATH PLACEMENT Right 05/19/2022   Procedure: INSERTION PORT-A-CATH;  Surgeon: Romie Levee, MD;  Location: [facility];  Service: General;  Laterality: Right;   ROBOTIC ASSISTED TOTAL HYSTERECTOMY WITH BILATERAL SALPINGO OOPHERECTOMY N/A 04/04/2023   Procedure: XI ROBOTIC ASSISTED TOTAL HYSTERECTOMY WITH BILATERAL SALPINGO OOPHORECTOMY;  Surgeon: Carver Fila, MD;  Location: [facility];  Service: Gynecology;  Laterality: N/A;   XI ROBOTIC ASSISTED LOWER ANTERIOR RESECTION N/A 04/04/2023   Procedure: XI ROBOTIC ASSISTED LOWER ANTERIOR RESECTION;  Surgeon: Romie Levee, MD;  Location: [facility];  Service: General;  Laterality: N/A;    Social History: Social History   Socioeconomic History   Marital status: Married    Spouse name: Not on file   Number of children: Not on file   Years of education: Not on file   Highest education level: Not on file  Occupational History   Not on file  Tobacco Use   Smoking status: Every Day    Packs/day: 0.25    Years: 27.00    Additional pack years: 0.00    Total pack years: 6.75    Types: Cigarettes   Smokeless tobacco: Never  Vaping Use   Vaping Use: Never used  Substance and Sexual Activity   Alcohol  use: No   Drug use: No   Sexual activity: Yes    Birth control/protection: Injection  Other Topics Concern   Not on file  Social History Narrative   Not on file   Social Determinants of Health   Financial Resource Strain: Not on file  Food Insecurity: No Food Insecurity (04/04/2023)   Hunger Vital Sign    Worried About Running Out of Food in the Last Year: Never true    Ran Out of Food in the Last Year: Never true  Transportation Needs: No Transportation Needs (04/04/2023)   PRAPARE - Administrator, Civil Service (Medical): No    Lack of Transportation (Non-Medical): No  Physical  Activity: Not on file  Stress: Not on file  Social Connections: Not on file  Intimate Partner Violence: Not At Risk (04/04/2023)   Humiliation, Afraid, Rape, and Kick questionnaire    Fear of Current or Ex-Partner: No    Emotionally Abused: No    Physically Abused: No    Sexually Abused: No    Family History: Family History  Problem Relation Age of Onset   Heart disease Mother    Diabetes Mother    Hearing loss Father    Heart disease Father    Hyperlipidemia Father    Hypertension Father    Stroke Father    Arthritis Father    Diabetes Father    Learning disabilities Brother    Diabetes Maternal Grandmother    Heart disease Maternal Grandmother    Diabetes Maternal Grandfather    Diabetes Paternal Grandmother    Diabetes Paternal Grandfather    Leukemia Maternal Uncle        d. 2s   Colon cancer Paternal Aunt        dx 13s   Breast cancer Cousin 33   Lung cancer Maternal Aunt     Current Medications:  Current Outpatient Medications:    estradiol (CLIMARA) 0.1 mg/24hr patch, Place 1 patch (0.1 mg total) onto the skin once a week. Start one week after surgery, Disp: 4 patch, Rfl: 6   estradiol (ESTRACE VAGINAL) 0.1 MG/GM vaginal cream, 1 week after surgery, begin to use cream nightly at bedtime for 2 weeks then 3 times a week after to assist with healing vagina. Use a  fingertip size amount of cream on finger and place slightly past vaginal entrance. Do not use the applicator that comes with cream., Disp: 42.5 g, Rfl: 3   loperamide (IMODIUM) 2 MG capsule, Take 1 capsule (2 mg total) by mouth 2 (two) times daily., Disp: 60 capsule, Rfl: 1   Multiple Vitamin (MULTIVITAMIN) tablet, Take 1 tablet by mouth daily., Disp: , Rfl:    oxyCODONE (OXY IR/ROXICODONE) 5 MG immediate release tablet, Take 1 tablet (5 mg total) by mouth every 6 (six) hours as needed for severe pain., Disp: 20 tablet, Rfl: 0   vitamin B-12 (CYANOCOBALAMIN) 250 MCG tablet, Take 250 mcg by mouth daily., Disp: , Rfl:    apixaban (ELIQUIS) 2.5 MG TABS tablet, Take 1 tablet (2.5 mg total) by mouth 2 (two) times daily. (Patient not taking: Reported on 06/04/2023), Disp: 60 tablet, Rfl: 0  Current Facility-Administered Medications:    medroxyPROGESTERone (DEPO-PROVERA) injection 150 mg, 150 mg, Intramuscular, Q90 days, Jannifer Rodney A, FNP, 150 mg at 09/21/22 1619   Allergies: Allergies  Allergen Reactions   Wellbutrin [Bupropion]     insomnia   Tramadol Rash    Mainly in chest and neck area    REVIEW OF SYSTEMS:   Review of Systems  Constitutional:  Negative for chills, fatigue and fever.  HENT:   Negative for lump/mass, mouth sores, nosebleeds, sore throat and trouble swallowing.   Eyes:  Negative for eye problems.  Respiratory:  Negative for cough and shortness of breath.   Cardiovascular:  Negative for chest pain, leg swelling and palpitations.  Gastrointestinal:  Negative for abdominal pain, constipation, diarrhea, nausea and vomiting.  Genitourinary:  Negative for bladder incontinence, difficulty urinating, dysuria, frequency, hematuria and nocturia.   Musculoskeletal:  Negative for arthralgias, back pain, flank pain, myalgias and neck pain.  Skin:  Negative for itching and rash.  Neurological:  Positive for numbness. Negative for dizziness  and headaches.  Hematological:  Does not  bruise/bleed easily.  Psychiatric/Behavioral:  Negative for depression, sleep disturbance and suicidal ideas. The patient is not nervous/anxious.   All other systems reviewed and are negative.    VITALS:   Blood pressure 112/69, pulse (!) 106, temperature (!) 97.5 F (36.4 C), resp. rate 18, weight 156 lb 3.2 oz (70.9 kg), SpO2 98 %.  Wt Readings from Last 3 Encounters:  06/04/23 156 lb 3.2 oz (70.9 kg)  04/27/23 153 lb 12.8 oz (69.8 kg)  04/12/23 154 lb 15.7 oz (70.3 kg)    Body mass index is 23.14 kg/m.  Performance status (ECOG): 0 - Asymptomatic  PHYSICAL EXAM:   Physical Exam Vitals and nursing note reviewed. Exam conducted with a chaperone present.  Constitutional:      Appearance: Normal appearance.  Cardiovascular:     Rate and Rhythm: Normal rate and regular rhythm.     Pulses: Normal pulses.     Heart sounds: Normal heart sounds.  Pulmonary:     Effort: Pulmonary effort is normal.     Breath sounds: Normal breath sounds.  Abdominal:     Palpations: Abdomen is soft. There is no hepatomegaly, splenomegaly or mass.     Tenderness: There is no abdominal tenderness.  Musculoskeletal:     Right lower leg: No edema.     Left lower leg: No edema.  Lymphadenopathy:     Cervical: No cervical adenopathy.     Right cervical: No superficial, deep or posterior cervical adenopathy.    Left cervical: No superficial, deep or posterior cervical adenopathy.     Upper Body:     Right upper body: No supraclavicular or axillary adenopathy.     Left upper body: No supraclavicular or axillary adenopathy.  Neurological:     General: No focal deficit present.     Mental Status: She is alert and oriented to person, place, and time.  Psychiatric:        Mood and Affect: Mood normal.        Behavior: Behavior normal.     LABS:      Latest Ref Rng & Units 05/28/2023    2:41 PM 04/09/2023    4:41 AM 04/08/2023    5:39 AM  CBC  WBC 4.0 - 10.5 K/uL 6.9  10.2  7.8   Hemoglobin 12.0  - 15.0 g/dL 16.1  09.6  04.5   Hematocrit 36.0 - 46.0 % 36.8  40.7  45.3   Platelets 150 - 400 K/uL 324  272  311       Latest Ref Rng & Units 05/28/2023    2:41 PM 04/11/2023    4:50 AM 04/10/2023    4:44 AM  CMP  Glucose 70 - 99 mg/dL 83  409  90   BUN 6 - 20 mg/dL 10  17  13    Creatinine 0.44 - 1.00 mg/dL 8.11  9.14  7.82   Sodium 135 - 145 mmol/L 136  131  132   Potassium 3.5 - 5.1 mmol/L 3.9  3.5  3.7   Chloride 98 - 111 mmol/L 103  93  99   CO2 22 - 32 mmol/L 25  26  21    Calcium 8.9 - 10.3 mg/dL 9.1  8.9  8.1   Total Protein 6.5 - 8.1 g/dL 7.2     Total Bilirubin 0.3 - 1.2 mg/dL 0.4     Alkaline Phos 38 - 126 U/L 82     AST 15 -  41 U/L 13     ALT 0 - 44 U/L 26        Lab Results  Component Value Date   CEA1 2.7 02/09/2023   /  CEA  Date Value Ref Range Status  02/09/2023 2.7 0.0 - 4.7 ng/mL Final    Comment:    (NOTE)                             Nonsmokers          <3.9                             Smokers             <5.6 Roche [employer] Electrochemiluminescence Immunoassay (ECLIA) Values obtained with different assay methods or kits cannot be used interchangeably.  Results cannot be interpreted as absolute evidence of the presence or absence of malignant disease. Performed At: [facility] [address] [place], [place] [id number] Jolene Schimke MD WU:[id number]    No results found for: "PSA1" No results found for: "[phone]" No results found for: "CAN125"  No results found for: "TOTALPROTELP", "ALBUMINELP", "A1GS", "A2GS", "BETS", "BETA2SER", "GAMS", "MSPIKE", "SPEI" No results found for: "TIBC", "FERRITIN", "IRONPCTSAT" No results found for: "LDH"   STUDIES:   No results found.

## 2023-06-04 ENCOUNTER — Encounter: Payer: Self-pay | Admitting: Hematology

## 2023-06-04 ENCOUNTER — Inpatient Hospital Stay: Payer: BC Managed Care – PPO | Attending: Hematology | Admitting: Hematology

## 2023-06-04 VITALS — BP 112/69 | HR 106 | Temp 97.5°F | Resp 18 | Wt 156.2 lb

## 2023-06-04 DIAGNOSIS — Z803 Family history of malignant neoplasm of breast: Secondary | ICD-10-CM | POA: Insufficient documentation

## 2023-06-04 DIAGNOSIS — G629 Polyneuropathy, unspecified: Secondary | ICD-10-CM | POA: Diagnosis not present

## 2023-06-04 DIAGNOSIS — Z800 Family history of malignant neoplasm of digestive organs: Secondary | ICD-10-CM | POA: Insufficient documentation

## 2023-06-04 DIAGNOSIS — Z90722 Acquired absence of ovaries, bilateral: Secondary | ICD-10-CM | POA: Insufficient documentation

## 2023-06-04 DIAGNOSIS — Z90710 Acquired absence of both cervix and uterus: Secondary | ICD-10-CM | POA: Diagnosis not present

## 2023-06-04 DIAGNOSIS — C20 Malignant neoplasm of rectum: Secondary | ICD-10-CM | POA: Insufficient documentation

## 2023-06-04 DIAGNOSIS — R197 Diarrhea, unspecified: Secondary | ICD-10-CM | POA: Diagnosis not present

## 2023-06-04 DIAGNOSIS — Z801 Family history of malignant neoplasm of trachea, bronchus and lung: Secondary | ICD-10-CM | POA: Insufficient documentation

## 2023-06-04 DIAGNOSIS — F17210 Nicotine dependence, cigarettes, uncomplicated: Secondary | ICD-10-CM | POA: Diagnosis not present

## 2023-06-04 DIAGNOSIS — C787 Secondary malignant neoplasm of liver and intrahepatic bile duct: Secondary | ICD-10-CM | POA: Diagnosis not present

## 2023-06-04 DIAGNOSIS — Z806 Family history of leukemia: Secondary | ICD-10-CM | POA: Diagnosis not present

## 2023-06-04 NOTE — Patient Instructions (Addendum)
[facility]  Discharge Instructions  You were seen and examined today by Dr. Ellin Saba.  Your labs are stable. Your pathology report showed a good response to chemotherapy. However, you still have the cancer in the liver; therefore, Dr. Ellin Saba will do a repeat CT scan of your chest abdomen and pelvis.  Follow-up as scheduled.  Thank you for choosing  [facility] Center - [facility] to provide your oncology and hematology care.   To afford each patient quality time with our provider, please arrive at least 15 minutes before your scheduled appointment time. You may need to reschedule your appointment if you arrive late (10 or more minutes). Arriving late affects you and other patients whose appointments are after yours.  Also, if you miss three or more appointments without notifying the office, you may be dismissed from the clinic at the provider's discretion.    Again, thank you for choosing [facility].  Our hope is that these requests will decrease the amount of time that you wait before being seen by our physicians.   If you have a lab appointment with the Cancer Center - please note that after April 8th, all labs will be drawn in the cancer center.  You do not have to check in or register with the main entrance as you have in the past but will complete your check-in at the cancer center.            _____________________________________________________________  Should you have questions after your visit to [facility], please contact our office at [phone] and follow the prompts.  Our office hours are 8:00 a.m. to 4:30 p.m. Monday - Thursday and 8:00 a.m. to 2:30 p.m. Friday.  Please note that voicemails left after 4:00 p.m. may not be returned until the following business day.  We are closed weekends and all major holidays.  You do have access to a nurse 24-7, just call the main number to the clinic [phone] and do not  press any options, hold on the line and a nurse will answer the phone.    For prescription refill requests, have your pharmacy contact our office and allow 72 hours.    Masks are no longer required in the cancer centers. If you would like for your care team to wear a mask while they are taking care of you, please let them know. You may have one support person who is at least 44 years old accompany you for your appointments.

## 2023-06-05 ENCOUNTER — Other Ambulatory Visit: Payer: Self-pay

## 2023-06-05 ENCOUNTER — Other Ambulatory Visit (HOSPITAL_COMMUNITY): Payer: Self-pay | Admitting: General Surgery

## 2023-06-05 DIAGNOSIS — C787 Secondary malignant neoplasm of liver and intrahepatic bile duct: Secondary | ICD-10-CM

## 2023-06-19 ENCOUNTER — Ambulatory Visit (HOSPITAL_COMMUNITY)
Admission: RE | Admit: 2023-06-19 | Discharge: 2023-06-19 | Disposition: A | Discharge status: Home / Self Care | Payer: BC Managed Care – PPO | Source: Ambulatory Visit | Attending: General Surgery | Admitting: General Surgery

## 2023-06-19 ENCOUNTER — Encounter (HOSPITAL_COMMUNITY): Payer: Self-pay

## 2023-06-19 DIAGNOSIS — C20 Malignant neoplasm of rectum: Secondary | ICD-10-CM | POA: Diagnosis not present

## 2023-06-19 DIAGNOSIS — C787 Secondary malignant neoplasm of liver and intrahepatic bile duct: Secondary | ICD-10-CM | POA: Diagnosis present

## 2023-06-19 MED ORDER — IOHEXOL 300 MG/ML  SOLN
100.0000 mL | Freq: Once | INTRAMUSCULAR | Status: AC | PRN
  Administered 2023-06-19: 100 mL

## 2023-06-29 ENCOUNTER — Ambulatory Visit (HOSPITAL_COMMUNITY)
Admission: RE | Admit: 2023-06-29 | Discharge: 2023-06-29 | Disposition: A | Discharge status: Home / Self Care | Payer: BC Managed Care – PPO | Source: Ambulatory Visit | Attending: Hematology | Admitting: Hematology

## 2023-06-29 ENCOUNTER — Encounter (HOSPITAL_COMMUNITY): Payer: Self-pay

## 2023-06-29 ENCOUNTER — Inpatient Hospital Stay: Payer: BC Managed Care – PPO

## 2023-06-29 DIAGNOSIS — C787 Secondary malignant neoplasm of liver and intrahepatic bile duct: Secondary | ICD-10-CM | POA: Insufficient documentation

## 2023-06-29 DIAGNOSIS — C20 Malignant neoplasm of rectum: Secondary | ICD-10-CM | POA: Insufficient documentation

## 2023-06-29 LAB — CBC WITH DIFFERENTIAL/PLATELET
Abs Immature Granulocytes: 0.03 10*3/uL (ref 0.00–0.07)
Basophils Absolute: 0.1 10*3/uL (ref 0.0–0.1)
Basophils Relative: 1 %
Eosinophils Absolute: 0.4 10*3/uL (ref 0.0–0.5)
Eosinophils Relative: 5 %
HCT: 41.2 % (ref 36.0–46.0)
Hemoglobin: 13.2 g/dL (ref 12.0–15.0)
Immature Granulocytes: 0 %
Lymphocytes Relative: 16 %
Lymphs Abs: 1.2 10*3/uL (ref 0.7–4.0)
MCH: 30.8 pg (ref 26.0–34.0)
MCHC: 32 g/dL (ref 30.0–36.0)
MCV: 96 fL (ref 80.0–100.0)
Monocytes Absolute: 0.3 10*3/uL (ref 0.1–1.0)
Monocytes Relative: 5 %
Neutro Abs: 5.4 10*3/uL (ref 1.7–7.7)
Neutrophils Relative %: 73 %
Platelets: 304 10*3/uL (ref 150–400)
RBC: 4.29 MIL/uL (ref 3.87–5.11)
RDW: 14.9 % (ref 11.5–15.5)
WBC: 7.4 10*3/uL (ref 4.0–10.5)
nRBC: 0 % (ref 0.0–0.2)

## 2023-06-29 LAB — COMPREHENSIVE METABOLIC PANEL
ALT: 16 U/L (ref 0–44)
AST: 14 U/L — ABNORMAL LOW (ref 15–41)
Albumin: 4 g/dL (ref 3.5–5.0)
Alkaline Phosphatase: 74 U/L (ref 38–126)
Anion gap: 10 (ref 5–15)
BUN: 8 mg/dL (ref 6–20)
CO2: 23 mmol/L (ref 22–32)
Calcium: 9.1 mg/dL (ref 8.9–10.3)
Chloride: 102 mmol/L (ref 98–111)
Creatinine, Ser: 0.7 mg/dL (ref 0.44–1.00)
GFR, Estimated: >60 mL/min (ref >60)
Glucose, Bld: 84 mg/dL (ref 70–99)
Potassium: 3.7 mmol/L (ref 3.5–5.1)
Sodium: 135 mmol/L (ref 135–145)
Total Bilirubin: 0.7 mg/dL (ref 0.3–1.2)
Total Protein: 7.4 g/dL (ref 6.5–8.1)

## 2023-06-29 MED ORDER — IOHEXOL 300 MG/ML  SOLN
100.0000 mL | Freq: Once | INTRAMUSCULAR | Status: AC | PRN
  Administered 2023-06-29: 85 mL via INTRAVENOUS

## 2023-06-29 MED ORDER — IOHEXOL 9 MG/ML PO SOLN
ORAL | Status: AC
  Filled 2023-06-29: qty 500

## 2023-07-02 ENCOUNTER — Encounter: Payer: Self-pay | Admitting: *Deleted

## 2023-07-02 NOTE — Progress Notes (Signed)
CT call report received from [facility] for abnormal findings on CT CAP.  Dr. Ellin Saba made aware.

## 2023-07-05 ENCOUNTER — Inpatient Hospital Stay: Payer: BC Managed Care – PPO | Attending: Hematology | Admitting: Hematology

## 2023-07-05 VITALS — BP 115/66 | HR 90 | Temp 97.7°F | Resp 18 | Wt 159.5 lb

## 2023-07-05 DIAGNOSIS — Z803 Family history of malignant neoplasm of breast: Secondary | ICD-10-CM | POA: Diagnosis not present

## 2023-07-05 DIAGNOSIS — Z801 Family history of malignant neoplasm of trachea, bronchus and lung: Secondary | ICD-10-CM | POA: Diagnosis not present

## 2023-07-05 DIAGNOSIS — Z800 Family history of malignant neoplasm of digestive organs: Secondary | ICD-10-CM | POA: Insufficient documentation

## 2023-07-05 DIAGNOSIS — C19 Malignant neoplasm of rectosigmoid junction: Secondary | ICD-10-CM | POA: Insufficient documentation

## 2023-07-05 DIAGNOSIS — C20 Malignant neoplasm of rectum: Secondary | ICD-10-CM

## 2023-07-05 DIAGNOSIS — F17210 Nicotine dependence, cigarettes, uncomplicated: Secondary | ICD-10-CM | POA: Diagnosis not present

## 2023-07-05 DIAGNOSIS — Z5111 Encounter for antineoplastic chemotherapy: Secondary | ICD-10-CM | POA: Diagnosis present

## 2023-07-05 DIAGNOSIS — C787 Secondary malignant neoplasm of liver and intrahepatic bile duct: Secondary | ICD-10-CM | POA: Diagnosis not present

## 2023-07-05 DIAGNOSIS — G629 Polyneuropathy, unspecified: Secondary | ICD-10-CM | POA: Insufficient documentation

## 2023-07-05 DIAGNOSIS — Z806 Family history of leukemia: Secondary | ICD-10-CM | POA: Insufficient documentation

## 2023-07-05 DIAGNOSIS — R197 Diarrhea, unspecified: Secondary | ICD-10-CM | POA: Insufficient documentation

## 2023-07-05 NOTE — Progress Notes (Signed)
[facility] [address] [place] [address]    Clinic Day:  07/19/2023  Referring physician: Junie Spencer, FNP  Patient Care Team: Junie Spencer, FNP as PCP - General (Family Medicine) Doreatha Massed, MD as Medical Oncologist (Medical Oncology) Therese Sarah, RN as Oncology Nurse Navigator (Medical Oncology)   ASSESSMENT & PLAN:   Assessment: 1. 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  Stools have become bloody/mucousy lately. - She also reported pain in the tailbone region since March 2023.  She also has right-sided lower back pain. - 50 pound weight loss in the last 9 months, part of weight loss was intentional.  She cut back on eating sweets and lost taste to sweets after COVID infection. - CT [facility] with contrast on 03/08/2022: Irregular circumferential masslike wall thickening of sigmoid colon/rectum with adjacent perirectal adenopathy.  Multiple small hypodense lesions in the liver, largest 2.9 cm in the central aspect of the liver, question metastatic disease. - Colonoscopy on 03/21/2022 by Dr. Marletta Lor: Fungating infiltrative nearly completely obstructing mass in the rectosigmoid colon, mass was circumferential measuring 4 cm in length. - Pathology: Rectal mass biopsy consistent with invasive moderately differentiated adenocarcinoma.  As there is very scant invasive tumor, MSI studies were deferred. - PET scan (03/23/2022): Hypermetabolic rectal primary long-segment with SUV 14.3.  Left posterior perirectal lymph node 7 mm with SUV 2.9.  Multiple tiny foci of hepatic hypermetabolism. - MRI of the liver (04/01/2022): Multiple small hypovascular rim-enhancing liver lesions, predominantly in the right hepatic lobe measuring up to 1.1 cm.  3.3 cm hypervascular mass in the central liver, most consistent with FNH/hepatic adenoma. - Liver biopsy (04/20/2022): Metastatic moderately  differentiated colonic adenocarcinoma with mucinous features - NGS testing shows K-ras G12 R mutation, PIK3CA exon 21 mutation.  MS-stable.  TMB-low. - Cycle 1 of FOLFOXIRI on 05/29/2022, bevacizumab added during cycle 2, cycle 13 on 11/15/2022 -MRI pelvis (10/23/2022): Known metastatic rectal cancer with persistent rectal narrowing and signs of involvement of posterior cervical stroma and anterior peritoneal reflection.  There is evidence of residual tumor in the posterior cervix.  Persistent presacral edema not substantially changed. - MRI liver (11/10/2022): 3 benign lesions and previous small rim-enhancing metastatic lesions in the liver are no longer seen. - Chemoradiation therapy with Xeloda from 12/11/2022 through 01/18/2023. - 04/04/2023: Robotic assisted LAR, diverting ileostomy, TAH with BSO - Pathology: YpT4b, ypN0, YPM1, 0/12 lymph nodes involved, no tumor deposits identified, margins negative, LVI negative, PNI positive, 1.5 cm grade 2 adenocarcinoma in the rectosigmoid colon, no perforation, MMR preserved.   2. Social/family history: - She is separated and is seen today with her daughter.  She works as a Pensions consultant at [employer].  She is current active smoker, 1 pack/day for 27 years.  Denies drinking alcohol. - Paternal aunt had colon cancer.  Maternal cousin has breast cancer.  Maternal uncle had leukemia and maternal cousin had leukemia.    Plan: Metastatic rectal cancer to the liver: - Reviewed CT CAP from 06/29/2023: Increase in size and number of bilateral metastatic liver lesions.  Presacral soft tissue thickening measures 4.4 x 2.6 cm. - She is seeing Dr. Maisie Fus next Monday. - Recommend restarting FOLFIRI and bevacizumab.  Will hold off on bevacizumab with cycle 1.  RTC with cycle 2.  2.  Diarrhea: - Take Imodium as needed.  Ileostomy output is stable.  3.  Peripheral neuropathy: -  She has tingling and numbness from ankles to toes.  No numbness in the fingertips.  Will closely  monitor.  Will avoid oxaliplatin at this time.    No orders of the defined types were placed in this encounter.    Alben Deeds Teague,acting as a Neurosurgeon for Doreatha Massed, MD.,have documented all relevant documentation on the behalf of Doreatha Massed, MD,as directed by  Doreatha Massed, MD while in the presence of Doreatha Massed, MD.  I, Doreatha Massed MD, have reviewed the above documentation for accuracy and completeness, and I agree with the above.    Doreatha Massed, MD   8/15/20247:12 PM  CHIEF COMPLAINT:   Diagnosis: metastatic rectal cancer to the liver    Cancer Staging  Rectal cancer [facility]) Staging form: Colon and Rectum, AJCC 8th Edition - Clinical stage from 03/28/2022: Stage IVA (cTX, cN1, cM1a) - Unsigned    Prior Therapy: 1. FOLFOXIRI and bevacizumab, 13 cycles, 05/29/22 - 11/15/22  2. Xeloda with radiation therapy, 12/11/22 - 01/18/23  Current Therapy: Observation   HISTORY OF PRESENT ILLNESS:   Oncology History  Rectal cancer (HCC)  03/28/2022 Initial Diagnosis   Rectal cancer (HCC)   05/29/2022 - 07/26/2022 Chemotherapy   Patient is on Treatment Plan : COLORECTAL FOLFOXIRI + Bevacizumab q14d     05/29/2022 - 11/17/2022 Chemotherapy   Patient is on Treatment Plan : COLORECTAL FOLFOXIRI + Bevacizumab q14d      Genetic Testing   No pathogenic variants identified on the [employer] CustomNext+RNA panel. The report date is 09/08/2022.  The CustomNext-Cancer+RNAinsight panel offered by [employer] includes sequencing and rearrangement analysis for the following 47 genes:  APC, ATM, AXIN2, BARD1, BMPR1A, BRCA1, BRCA2, BRIP1, CDH1, CDK4, CDKN2A, CHEK2, DICER1, EPCAM, GREM1, HOXB13, MEN1, MLH1, MSH2, MSH3, MSH6, MUTYH, NBN, NF1, NF2, NTHL1, PALB2, PMS2, POLD1, POLE, PTEN, RAD51C, RAD51D, RECQL, RET, SDHA, SDHAF2, SDHB, SDHC, SDHD, SMAD4, SMARCA4, STK11, TP53, TSC1, TSC2, and VHL.  RNA data is routinely analyzed for use in variant interpretation  for all genes.   07/16/2023 -  Chemotherapy   Patient is on Treatment Plan : COLORECTAL FOLFIRI + Bevacizumab q14d        INTERVAL HISTORY:   Elmina is a 42 y.o. female presenting to clinic today for follow up of metastatic rectal cancer to the liver. She was last seen by me on 06/04/23.  Since her last visit, she underwent CT C/A/P on7/26/24 that found: increased size and number of the bilobar metastatic hepatic lesions; irregular  6 mm right upper lobe pulmonary nodule with an adjacent thick walled cystic space which measures 11 mm and new pulmonary nodules in the anterior left upper lobe measure up to 3 mm; presacral soft tissue thickening measures 4.4 x 2.6 cm containing a gas and fluid collection measuring 2.6 x 1.1 cm also which appears to be in continuity with the rectal stump; and small  volume pelvic free fluid with rectal lymph nodes or soft tissue nodules measuring up to 16 x 9 mm, concerning for pelvic nodal metastasis or peritoneal disease.  Today, she states that she is doing well overall. Her appetite level is at 100%. Her energy level is at 80%.  PAST MEDICAL HISTORY:   Past Medical History: Past Medical History:  Diagnosis Date   Cancer [facility])    Neuromuscular disorder (HCC)    neuropathy in hands and feet from chemo   Port-A-Cath in place 05/24/2022   upper Rt chest   Tobacco abuse     Surgical History: Past  Surgical History:  Procedure Laterality Date   BIOPSY  03/21/2022   Procedure: BIOPSY;  Surgeon: Lanelle Bal, DO;  [facility];  Service: Endoscopy;;   COLONOSCOPY WITH PROPOFOL N/A 03/21/2022   Procedure: COLONOSCOPY WITH PROPOFOL;  Surgeon: Lanelle Bal, DO;  Location: [facility];  Service: Endoscopy;  Laterality: N/A;  8:30am   FLEXIBLE SIGMOIDOSCOPY N/A 05/19/2022   Procedure: FLEXIBLE SIGMOIDOSCOPY WITH TATTOO INJECTION;  Surgeon: Romie Levee, MD;  Location: [facility];  Service: General;  Laterality: N/A;   ILEOSTOMY N/A 04/04/2023    Procedure: DIVERTING ILEOSTOMY;  Surgeon: Romie Levee, MD;  Location: [facility];  Service: General;  Laterality: N/A;   LAPAROSCOPIC DIVERTED COLOSTOMY N/A 05/19/2022   Procedure: LAPAROSCOPIC DIVERTED OSTOMY;  Surgeon: Romie Levee, MD;  Location: [facility];  Service: General;  Laterality: N/A;   PORTACATH PLACEMENT Right 05/19/2022   Procedure: INSERTION PORT-A-CATH;  Surgeon: Romie Levee, MD;  Location: [facility];  Service: General;  Laterality: Right;   ROBOTIC ASSISTED TOTAL HYSTERECTOMY WITH BILATERAL SALPINGO OOPHERECTOMY N/A 04/04/2023   Procedure: XI ROBOTIC ASSISTED TOTAL HYSTERECTOMY WITH BILATERAL SALPINGO OOPHORECTOMY;  Surgeon: Carver Fila, MD;  Location: [facility];  Service: Gynecology;  Laterality: N/A;   XI ROBOTIC ASSISTED LOWER ANTERIOR RESECTION N/A 04/04/2023   Procedure: XI ROBOTIC ASSISTED LOWER ANTERIOR RESECTION;  Surgeon: Romie Levee, MD;  Location: [facility];  Service: General;  Laterality: N/A;    Social History: Social History   Socioeconomic History   Marital status: Married    Spouse name: Not on file   Number of children: Not on file   Years of education: Not on file   Highest education level: Not on file  Occupational History   Not on file  Tobacco Use   Smoking status: Every Day    Current packs/day: 0.25    Average packs/day: 0.3 packs/day for 27.0 years (6.8 ttl pk-yrs)    Types: Cigarettes   Smokeless tobacco: Never  Vaping Use   Vaping status: Never Used  Substance and Sexual Activity   Alcohol use: No   Drug use: No   Sexual activity: Yes    Birth control/protection: Injection  Other Topics Concern   Not on file  Social History Narrative   Not on file   Social Determinants of Health   Financial Resource Strain: Not on file  Food Insecurity: No Food Insecurity (04/04/2023)   Hunger Vital Sign    Worried About Running Out of Food in the Last Year: Never true    Ran Out of Food in the Last Year: Never true  Transportation Needs: No  Transportation Needs (04/04/2023)   PRAPARE - Administrator, Civil Service (Medical): No    Lack of Transportation (Non-Medical): No  Physical Activity: Not on file  Stress: Not on file  Social Connections: Not on file  Intimate Partner Violence: Not At Risk (04/04/2023)   Humiliation, Afraid, Rape, and Kick questionnaire    Fear of Current or Ex-Partner: No    Emotionally Abused: No    Physically Abused: No    Sexually Abused: No    Family History: Family History  Problem Relation Age of Onset   Heart disease Mother    Diabetes Mother    Hearing loss Father    Heart disease Father    Hyperlipidemia Father    Hypertension Father    Stroke Father    Arthritis Father    Diabetes Father    Learning disabilities Brother  Diabetes Maternal Grandmother    Heart disease Maternal Grandmother    Diabetes Maternal Grandfather    Diabetes Paternal Grandmother    Diabetes Paternal Grandfather    Leukemia Maternal Uncle        d. 80s   Colon cancer Paternal Aunt        dx 34s   Breast cancer Cousin 33   Lung cancer Maternal Aunt     Current Medications:  Current Outpatient Medications:    apixaban (ELIQUIS) 2.5 MG TABS tablet, Take 1 tablet (2.5 mg total) by mouth 2 (two) times daily., Disp: 60 tablet, Rfl: 0   estradiol (CLIMARA) 0.1 mg/24hr patch, Place 1 patch (0.1 mg total) onto the skin once a week. Start one week after surgery, Disp: 4 patch, Rfl: 6   estradiol (ESTRACE VAGINAL) 0.1 MG/GM vaginal cream, 1 week after surgery, begin to use cream nightly at bedtime for 2 weeks then 3 times a week after to assist with healing vagina. Use a fingertip size amount of cream on finger and place slightly past vaginal entrance. Do not use the applicator that comes with cream., Disp: 42.5 g, Rfl: 3   loperamide (IMODIUM) 2 MG capsule, Take 1 capsule (2 mg total) by mouth 2 (two) times daily., Disp: 60 capsule, Rfl: 1   Multiple Vitamin (MULTIVITAMIN) tablet, Take 1 tablet by  mouth daily., Disp: , Rfl:    oxyCODONE (OXY IR/ROXICODONE) 5 MG immediate release tablet, Take 1 tablet (5 mg total) by mouth every 6 (six) hours as needed for severe pain., Disp: 20 tablet, Rfl: 0   vitamin B-12 (CYANOCOBALAMIN) 250 MCG tablet, Take 250 mcg by mouth daily., Disp: , Rfl:   Current Facility-Administered Medications:    medroxyPROGESTERone (DEPO-PROVERA) injection 150 mg, 150 mg, Intramuscular, Q90 days, Jannifer Rodney A, FNP, 150 mg at 09/21/22 1619   Allergies: Allergies  Allergen Reactions   Wellbutrin [Bupropion]     insomnia   Tramadol Rash    Mainly in chest and neck area    REVIEW OF SYSTEMS:   Review of Systems  Constitutional:  Negative for chills, fatigue and fever.  HENT:   Negative for lump/mass, mouth sores, nosebleeds, sore throat and trouble swallowing.   Eyes:  Negative for eye problems.  Respiratory:  Negative for cough and shortness of breath.   Cardiovascular:  Negative for chest pain, leg swelling and palpitations.  Gastrointestinal:  Negative for abdominal pain, constipation, diarrhea, nausea and vomiting.  Genitourinary:  Negative for bladder incontinence, difficulty urinating, dysuria, frequency, hematuria and nocturia.   Musculoskeletal:  Negative for arthralgias, back pain, flank pain, myalgias and neck pain.  Skin:  Negative for itching and rash.  Neurological:  Positive for numbness. Negative for dizziness and headaches.  Hematological:  Does not bruise/bleed easily.  Psychiatric/Behavioral:  Negative for depression, sleep disturbance and suicidal ideas. The patient is not nervous/anxious.   All other systems reviewed and are negative.    VITALS:   Blood pressure 115/66, pulse 90, temperature 97.7 F (36.5 C), temperature source Tympanic, resp. rate 18, weight 159 lb 8 oz (72.3 kg), last menstrual period 04/19/2022, SpO2 99%.  Wt Readings from Last 3 Encounters:  07/16/23 158 lb 6.4 oz (71.8 kg)  07/05/23 159 lb 8 oz (72.3 kg)   06/04/23 156 lb 3.2 oz (70.9 kg)    Body mass index is 23.62 kg/m.  Performance status (ECOG): 0 - Asymptomatic  PHYSICAL EXAM:   Physical Exam Vitals and nursing note reviewed. Exam conducted with a  chaperone present.  Constitutional:      Appearance: Normal appearance.  Cardiovascular:     Rate and Rhythm: Normal rate and regular rhythm.     Pulses: Normal pulses.     Heart sounds: Normal heart sounds.  Pulmonary:     Effort: Pulmonary effort is normal.     Breath sounds: Normal breath sounds.  Abdominal:     Palpations: Abdomen is soft. There is no hepatomegaly, splenomegaly or mass.     Tenderness: There is no abdominal tenderness.  Musculoskeletal:     Right lower leg: No edema.     Left lower leg: No edema.  Lymphadenopathy:     Cervical: No cervical adenopathy.     Right cervical: No superficial, deep or posterior cervical adenopathy.    Left cervical: No superficial, deep or posterior cervical adenopathy.     Upper Body:     Right upper body: No supraclavicular or axillary adenopathy.     Left upper body: No supraclavicular or axillary adenopathy.  Neurological:     General: No focal deficit present.     Mental Status: She is alert and oriented to person, place, and time.  Psychiatric:        Mood and Affect: Mood normal.        Behavior: Behavior normal.     LABS:      Latest Ref Rng & Units 07/16/2023    8:02 AM 06/29/2023   12:05 PM 05/28/2023    2:41 PM  CBC  WBC 4.0 - 10.5 K/uL 6.4  7.4  6.9   Hemoglobin 12.0 - 15.0 g/dL 81.1  91.4  78.2   Hematocrit 36.0 - 46.0 % 38.8  41.2  36.8   Platelets 150 - 400 K/uL 248  304  324       Latest Ref Rng & Units 07/16/2023    8:02 AM 06/29/2023   12:05 PM 05/28/2023    2:41 PM  CMP  Glucose 70 - 99 mg/dL 90  84  83   BUN 6 - 20 mg/dL 10  8  10    Creatinine 0.44 - 1.00 mg/dL 9.56  2.13  0.86   Sodium 135 - 145 mmol/L 136  135  136   Potassium 3.5 - 5.1 mmol/L 3.9  3.7  3.9   Chloride 98 - 111 mmol/L 104   102  103   CO2 22 - 32 mmol/L 22  23  25    Calcium 8.9 - 10.3 mg/dL 9.1  9.1  9.1   Total Protein 6.5 - 8.1 g/dL 6.4  7.4  7.2   Total Bilirubin 0.3 - 1.2 mg/dL 0.4  0.7  0.4   Alkaline Phos 38 - 126 U/L 62  74  82   AST 15 - 41 U/L 15  14  13    ALT 0 - 44 U/L 17  16  26       Lab Results  Component Value Date   CEA1 31.3 (H) 06/29/2023   /  CEA  Date Value Ref Range Status  06/29/2023 31.3 (H) 0.0 - 4.7 ng/mL Final    Comment:    (NOTE)                             Nonsmokers          <3.9  Smokers             <5.6 Roche [employer] Electrochemiluminescence Immunoassay (ECLIA) Values obtained with different assay methods or kits cannot be used interchangeably.  Results cannot be interpreted as absolute evidence of the presence or absence of malignant disease. Performed At: [facility] [address] [place], [place] [id number] Jolene Schimke MD LK:[id number]    No results found for: "PSA1" No results found for: [phone]" No results found for: "CAN125"  No results found for: "TOTALPROTELP", "ALBUMINELP", "A1GS", "A2GS", "BETS", "BETA2SER", "GAMS", "MSPIKE", "SPEI" No results found for: "TIBC", "FERRITIN", "IRONPCTSAT" No results found for: "LDH"   STUDIES:   CT CHEST ABDOMEN PELVIS W CONTRAST  Result Date: 06/29/2023 CLINICAL DATA:  Rectal cancer, monitor.  * Tracking Code: BO * EXAM: CT CHEST, ABDOMEN, AND PELVIS WITH CONTRAST TECHNIQUE: Multidetector CT imaging of the chest, abdomen and pelvis was performed following the standard protocol during bolus administration of intravenous contrast. RADIATION DOSE REDUCTION: This exam was performed according to the departmental dose-optimization program which includes automated exposure control, adjustment of the mA and/or kV according to patient size and/or use of iterative reconstruction technique. CONTRAST:  85mL OMNIPAQUE IOHEXOL 300 MG/ML  SOLN COMPARISON:  Multiple priors including MRI  February 23, 2023, CT February 09, 2023 and MRI February 09, 2023. FINDINGS: CT CHEST FINDINGS Cardiovascular: Right chest Port-A-Cath with tip in the SVC. Normal caliber thoracic aorta. No central pulmonary embolus on this nondedicated study. Normal size heart. Trace pericardial effusion is similar prior and within physiologic normal limits. Mediastinum/Nodes: No suspicious thyroid nodule. No pathologically enlarged mediastinal, hilar or axillary lymph nodes. The esophagus is grossly unremarkable. Lungs/Pleura: Irregular 6 mm right upper lobe pulmonary nodule subtly evident in retrospect measuring 3 mm now with an adjacent thick walled cystic space which measures 11 mm on image 75/4. New pulmonary nodules in the anterior left upper lobe measure up to 3 mm on image 59/4. Musculoskeletal: No aggressive lytic or blastic lesion of bone. CT ABDOMEN PELVIS FINDINGS Hepatobiliary: Increased size of the bilobar metastatic hepatic lesions the majority of which are overall hypodense of the than a large mass in the central liver. For reference: -enhancing lesion in the central right lobe of the liver measures 3.7 x 3.4 cm on image 56/3 previously 3.2 x 2.5 cm. -segment II hepatic lesion measures 3.5 x 3.2 cm on image 50/3 previously 13 x 11 mm. Additional hypodense hepatic lesions are new from prior CT with some reflecting subtle areas of restricted diffusion seen on prior examination for instance in the dome of the right lobe of the liver measuring 16 mm on image 45/3 previously evident on prior MRI only as a tiny focus of restricted diffusion measuring 5 mm. An additional adjacent lesion measuring 8 mm on image 45/3 was not evident on prior MRI Gallbladder is unremarkable.  No biliary ductal dilation. Pancreas: No pancreatic ductal dilation or evidence of acute inflammation. Spleen: No splenomegaly or focal splenic lesion. Adrenals/Urinary Tract: Bilateral adrenal glands appear normal. No hydronephrosis. Kidneys demonstrate symmetric  enhancement. Urinary bladder is unremarkable for degree of distension. Stomach/Bowel: Radiopaque enteric contrast material traverses the left anterior abdominal wall ostomy. Prior rectal resection with Hartmann's pouch formation and left anterior abdominal wall colostomy. Presacral soft tissue thickening measures 4.4 x 2.6 cm on image 110/3. Within this soft tissue thickening there is a gas and fluid collection measuring 2.6 x 1.1 cm also on image 110/3 which appears to be in continuity with the rectal stump. Small volume  pelvic free fluid with rectal lymph nodes or soft tissue nodules measuring up to 16 x 9 mm on image 110/3. No pathologic dilation of small or large bowel. Vascular/Lymphatic: Normal caliber abdominal aorta. Smooth IVC contours. The portal, splenic and superior mesenteric veins are patent. No pathologically enlarged abdominal lymph nodes. Pelvic adenopathy/soft tissue nodularity as above. Reproductive: Surgically absent. Other: Trace pelvic free fluid. Musculoskeletal: No aggressive lytic or blastic lesion of bone. Probable postradiation change in the sacrum. IMPRESSION: 1. Increased size and number of the bilobar metastatic hepatic lesions. 2. Irregular 6 mm right upper lobe pulmonary nodule with an adjacent thick walled cystic space which measures 11 mm and new pulmonary nodules in the anterior left upper lobe measure up to 3 mm. Findings are nonspecific but concerning for pulmonary metastatic disease. Consider short-term interval follow-up dedicated chest CT 3. Presacral soft tissue thickening measures 4.4 x 2.6 cm containing a gas and fluid collection measuring 2.6 x 1.1 cm also which appears to be in continuity with the rectal stump. 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 4. Small volume pelvic free fluid with rectal lymph nodes or soft tissue nodules measuring up to 16 x 9 mm, concerning for pelvic nodal  metastasis or peritoneal disease. Attention on follow-up imaging suggested. These results will be called to the ordering clinician or representative by the Radiologist Assistant, and communication documented in the PACS or [employer]. Electronically Signed   By: Maudry Mayhew M.D.   On: 06/29/2023 15:01

## 2023-07-05 NOTE — Patient Instructions (Addendum)
[facility]  Discharge Instructions  You were seen and examined today by Dr. Ellin Saba today.  Your cancer has metastasized and you need to restart treatment.   You will start a regimen known as FOLFIRI and Avastin. This, too, uses a pump to administer chemotherapy.  Follow-up as scheduled.  Thank you for choosing [facility] to provide your oncology and hematology care.   To afford each patient quality time with our provider, please arrive at least 15 minutes before your scheduled appointment time. You may need to reschedule your appointment if you arrive late (10 or more minutes). Arriving late affects you and other patients whose appointments are after yours.  Also, if you miss three or more appointments without notifying the office, you may be dismissed from the clinic at the provider's discretion.    Again, thank you for choosing [facility].  Our hope is that these requests will decrease the amount of time that you wait before being seen by our physicians.   If you have a lab appointment with the Cancer Center - please note that after April 8th, all labs will be drawn in the cancer center.  You do not have to check in or register with the main entrance as you have in the past but will complete your check-in at the cancer center.            _____________________________________________________________  Should you have questions after your visit to [facility], please contact our office at [phone] and follow the prompts.  Our office hours are 8:00 a.m. to 4:30 p.m. Monday - Thursday and 8:00 a.m. to 2:30 p.m. Friday.  Please note that voicemails left after 4:00 p.m. may not be returned until the following business day.  We are closed weekends and all major holidays.  You do have access to a nurse 24-7, just call the main number to the clinic [phone] and do not press any options, hold on the line and  a nurse will answer the phone.    For prescription refill requests, have your pharmacy contact our office and allow 72 hours.    Masks are no longer required in the cancer centers. If you would like for your care team to wear a mask while they are taking care of you, please let them know. You may have one support person who is at least 44 years old accompany you for your appointments.

## 2023-07-07 ENCOUNTER — Other Ambulatory Visit: Payer: Self-pay

## 2023-07-10 ENCOUNTER — Other Ambulatory Visit (HOSPITAL_COMMUNITY): Payer: Self-pay | Admitting: General Surgery

## 2023-07-10 DIAGNOSIS — K913 Postprocedural intestinal obstruction, unspecified as to partial versus complete: Secondary | ICD-10-CM

## 2023-07-13 ENCOUNTER — Encounter (HOSPITAL_COMMUNITY): Payer: Self-pay | Admitting: Hematology

## 2023-07-13 ENCOUNTER — Other Ambulatory Visit (HOSPITAL_COMMUNITY): Payer: Self-pay | Admitting: Hematology

## 2023-07-13 DIAGNOSIS — Z95828 Presence of other vascular implants and grafts: Secondary | ICD-10-CM

## 2023-07-13 DIAGNOSIS — C20 Malignant neoplasm of rectum: Secondary | ICD-10-CM

## 2023-07-13 NOTE — Progress Notes (Signed)
START ON PATHWAY REGIMEN - Colorectal     A cycle is every 14 days:     Bevacizumab-xxxx      Irinotecan      Leucovorin      Fluorouracil      Fluorouracil   **Always confirm dose/schedule in your pharmacy ordering system**  Patient Characteristics: Distant Metastases, Nonsurgical Candidate, Non-KRAS G12C, RAS Mutation Positive/Unknown (BRAF V600 Wild-Type/Unknown), Standard Cytotoxic Therapy, Second Line Standard Cytotoxic Therapy, Bevacizumab Eligible Tumor Location: Rectal Therapeutic Status: Distant Metastases Microsatellite/Mismatch Repair Status: Unknown BRAF Mutation Status: Wild-Type (no mutation) KRAS/NRAS Mutation Status: Non-KRAS G12C, RAS Mutation Positive Preferred Therapy Approach: Standard Cytotoxic Therapy Standard Cytotoxic Line of Therapy: Second Line Standard Cytotoxic Therapy Bevacizumab Eligibility: Eligible Intent of Therapy: Non-Curative / Palliative Intent, Discussed with Patient

## 2023-07-13 NOTE — Progress Notes (Signed)
DISCONTINUE ON PATHWAY REGIMEN - Colorectal     A cycle is every 14 days:     Bevacizumab-xxxx      Irinotecan      Oxaliplatin      Leucovorin      Fluorouracil   **Always confirm dose/schedule in your pharmacy ordering system**  REASON: Other Reason PRIOR TREATMENT: [id number]: mFOLFOXIRI + Bevacizumab q14 Days TREATMENT RESPONSE: Partial Response (PR)    Patient Characteristics: Distant Metastases, Nonsurgical Candidate Tumor Location: Rectal Therapeutic Status: Distant Metastases Microsatellite/Mismatch Repair Status: Unknown BRAF Mutation Status: Wild-Type (no mutation)

## 2023-07-16 ENCOUNTER — Inpatient Hospital Stay: Payer: BC Managed Care – PPO

## 2023-07-16 VITALS — BP 95/62 | HR 60 | Temp 97.3°F | Resp 18

## 2023-07-16 DIAGNOSIS — Z95828 Presence of other vascular implants and grafts: Secondary | ICD-10-CM

## 2023-07-16 DIAGNOSIS — Z5111 Encounter for antineoplastic chemotherapy: Secondary | ICD-10-CM | POA: Diagnosis not present

## 2023-07-16 DIAGNOSIS — C20 Malignant neoplasm of rectum: Secondary | ICD-10-CM

## 2023-07-16 LAB — COMPREHENSIVE METABOLIC PANEL
ALT: 17 U/L (ref 0–44)
AST: 15 U/L (ref 15–41)
Albumin: 3.6 g/dL (ref 3.5–5.0)
Alkaline Phosphatase: 62 U/L (ref 38–126)
Anion gap: 10 (ref 5–15)
BUN: 10 mg/dL (ref 6–20)
CO2: 22 mmol/L (ref 22–32)
Calcium: 9.1 mg/dL (ref 8.9–10.3)
Chloride: 104 mmol/L (ref 98–111)
Creatinine, Ser: 0.72 mg/dL (ref 0.44–1.00)
GFR, Estimated: >60 mL/min (ref >60)
Glucose, Bld: 90 mg/dL (ref 70–99)
Potassium: 3.9 mmol/L (ref 3.5–5.1)
Sodium: 136 mmol/L (ref 135–145)
Total Bilirubin: 0.4 mg/dL (ref 0.3–1.2)
Total Protein: 6.4 g/dL — ABNORMAL LOW (ref 6.5–8.1)

## 2023-07-16 LAB — CBC WITH DIFFERENTIAL/PLATELET
Abs Immature Granulocytes: 0.01 10*3/uL (ref 0.00–0.07)
Basophils Absolute: 0.1 10*3/uL (ref 0.0–0.1)
Basophils Relative: 1 %
Eosinophils Absolute: 0.4 10*3/uL (ref 0.0–0.5)
Eosinophils Relative: 7 %
HCT: 38.8 % (ref 36.0–46.0)
Hemoglobin: 12.6 g/dL (ref 12.0–15.0)
Immature Granulocytes: 0 %
Lymphocytes Relative: 12 %
Lymphs Abs: 0.8 10*3/uL (ref 0.7–4.0)
MCH: 31.3 pg (ref 26.0–34.0)
MCHC: 32.5 g/dL (ref 30.0–36.0)
MCV: 96.5 fL (ref 80.0–100.0)
Monocytes Absolute: 0.3 10*3/uL (ref 0.1–1.0)
Monocytes Relative: 5 %
Neutro Abs: 4.9 10*3/uL (ref 1.7–7.7)
Neutrophils Relative %: 75 %
Platelets: 248 10*3/uL (ref 150–400)
RBC: 4.02 MIL/uL (ref 3.87–5.11)
RDW: 14.4 % (ref 11.5–15.5)
WBC: 6.4 10*3/uL (ref 4.0–10.5)
nRBC: 0 % (ref 0.0–0.2)

## 2023-07-16 LAB — URINALYSIS, DIPSTICK ONLY
Bilirubin Urine: NEGATIVE
Glucose, UA: NEGATIVE mg/dL
Hgb urine dipstick: NEGATIVE
Ketones, ur: NEGATIVE mg/dL
Leukocytes,Ua: NEGATIVE
Nitrite: NEGATIVE
Protein, ur: NEGATIVE mg/dL
Specific Gravity, Urine: 1.024 (ref 1.005–1.030)
pH: 5 (ref 5.0–8.0)

## 2023-07-16 LAB — MAGNESIUM: Magnesium: 1.9 mg/dL (ref 1.7–2.4)

## 2023-07-16 MED ORDER — FLUOROURACIL CHEMO INJECTION 2.5 GM/50ML
400.0000 mg/m2 | Freq: Once | INTRAVENOUS | Status: AC
  Administered 2023-07-16: 750 mg via INTRAVENOUS
  Filled 2023-07-16: qty 15

## 2023-07-16 MED ORDER — SODIUM CHLORIDE 0.9 % IV SOLN
180.0000 mg/m2 | Freq: Once | INTRAVENOUS | Status: AC
  Administered 2023-07-16: 340 mg via INTRAVENOUS
  Filled 2023-07-16: qty 17

## 2023-07-16 MED ORDER — SODIUM CHLORIDE 0.9 % IV SOLN
5.0000 mg/kg | Freq: Once | INTRAVENOUS | Status: AC
  Administered 2023-07-16: 400 mg via INTRAVENOUS
  Filled 2023-07-16: qty 16

## 2023-07-16 MED ORDER — SODIUM CHLORIDE 0.9 % IV SOLN: Freq: Once | INTRAVENOUS | Status: AC

## 2023-07-16 MED ORDER — PALONOSETRON HCL INJECTION 0.25 MG/5ML
0.2500 mg | Freq: Once | INTRAVENOUS | Status: AC
  Administered 2023-07-16: 0.25 mg via INTRAVENOUS
  Filled 2023-07-16: qty 5

## 2023-07-16 MED ORDER — SODIUM CHLORIDE 0.9 % IV SOLN
10.0000 mg | Freq: Once | INTRAVENOUS | Status: AC
  Administered 2023-07-16: 10 mg via INTRAVENOUS
  Filled 2023-07-16: qty 10

## 2023-07-16 MED ORDER — SODIUM CHLORIDE 0.9 % IV SOLN
400.0000 mg/m2 | Freq: Once | INTRAVENOUS | Status: AC
  Administered 2023-07-16: 748 mg via INTRAVENOUS
  Filled 2023-07-16: qty 37.4

## 2023-07-16 MED ORDER — SODIUM CHLORIDE 0.9 % IV SOLN
2400.0000 mg/m2 | INTRAVENOUS | Status: DC
  Administered 2023-07-16: 4500 mg via INTRAVENOUS
  Filled 2023-07-16: qty 90

## 2023-07-16 NOTE — Progress Notes (Signed)
Patient presents today for treatment, no issues at this time. Consent obtained and will proceed as planned , labs reviewed and meet parameters.    Treatment given per orders. Patient tolerated it well without problems. Vitals stable and discharged home from clinic ambulatory. Follow up as scheduled.

## 2023-07-16 NOTE — Patient Instructions (Signed)
[facility]  Discharge Instructions: Thank you for choosing [facility] to provide your oncology and hematology care.  If you have a lab appointment with the Cancer Center - please note that after April 8th, 2024, all labs will be drawn in the cancer center.  You do not have to check in or register with the main entrance as you have in the past but will complete your check-in in the cancer center.  Wear comfortable clothing and clothing appropriate for easy access to any Portacath or PICC line.   We strive to give you quality time with your provider. You may need to reschedule your appointment if you arrive late (15 or more minutes).  Arriving late affects you and other patients whose appointments are after yours.  Also, if you miss three or more appointments without notifying the office, you may be dismissed from the clinic at the provider's discretion.      For prescription refill requests, have your pharmacy contact our office and allow 72 hours for refills to be completed.    Today you received the following chemotherapy and/or immunotherapy agents leucovorin, irrinotecan, fluorouracil   To help prevent nausea and vomiting after your treatment, we encourage you to take your nausea medication as directed.  BELOW ARE SYMPTOMS THAT SHOULD BE REPORTED IMMEDIATELY: *FEVER GREATER THAN 100.4 F (38 C) OR HIGHER *CHILLS OR SWEATING *NAUSEA AND VOMITING THAT IS NOT CONTROLLED WITH YOUR NAUSEA MEDICATION *UNUSUAL SHORTNESS OF BREATH *UNUSUAL BRUISING OR BLEEDING *URINARY PROBLEMS (pain or burning when urinating, or frequent urination) *BOWEL PROBLEMS (unusual diarrhea, constipation, pain near the anus) TENDERNESS IN MOUTH AND THROAT WITH OR WITHOUT PRESENCE OF ULCERS (sore throat, sores in mouth, or a toothache) UNUSUAL RASH, SWELLING OR PAIN  UNUSUAL VAGINAL DISCHARGE OR ITCHING   Items with * indicate a potential emergency and should be followed up as soon  as possible or go to the Emergency Department if any problems should occur.  Please show the CHEMOTHERAPY ALERT CARD or IMMUNOTHERAPY ALERT CARD at check-in to the Emergency Department and triage nurse.  Should you have questions after your visit or need to cancel or reschedule your appointment, please contact [facility] [phone]  and follow the prompts.  Office hours are 8:00 a.m. to 4:30 p.m. Monday - Friday. Please note that voicemails left after 4:00 p.m. may not be returned until the following business day.  We are closed weekends and major holidays. You have access to a nurse at all times for urgent questions. Please call the main number to the clinic [phone] and follow the prompts.  For any non-urgent questions, you may also contact your provider using MyChart. We now offer e-Visits for anyone 36 and older to request care online for non-urgent symptoms. For details visit [email or web address].   Also download the MyChart app! Go to the app store, search "MyChart", open the app, select , and log in with your MyChart username and password.

## 2023-07-18 ENCOUNTER — Inpatient Hospital Stay: Payer: BC Managed Care – PPO

## 2023-07-18 VITALS — BP 106/69 | HR 64 | Temp 96.6°F | Resp 18

## 2023-07-18 DIAGNOSIS — Z95828 Presence of other vascular implants and grafts: Secondary | ICD-10-CM

## 2023-07-18 DIAGNOSIS — Z5111 Encounter for antineoplastic chemotherapy: Secondary | ICD-10-CM | POA: Diagnosis not present

## 2023-07-18 DIAGNOSIS — C20 Malignant neoplasm of rectum: Secondary | ICD-10-CM

## 2023-07-18 MED ORDER — HEPARIN SOD (PORK) LOCK FLUSH 100 UNIT/ML IV SOLN
500.0000 [IU] | Freq: Once | INTRAVENOUS | Status: AC | PRN
  Administered 2023-07-18: 500 [IU]

## 2023-07-18 MED ORDER — SODIUM CHLORIDE 0.9% FLUSH
10.0000 mL | INTRAVENOUS | Status: DC | PRN
  Administered 2023-07-18: 10 mL

## 2023-07-18 NOTE — Progress Notes (Signed)
Patient for chemotherapy pump disconnect with no complaints voiced.  Patients port flushed without difficulty.  Good blood return noted with no bruising or swelling noted at site.  Band aid applied.  VSS with discharge and left ambulatory with no s/s of distress noted.   

## 2023-07-18 NOTE — Patient Instructions (Signed)

## 2023-07-19 ENCOUNTER — Encounter (HOSPITAL_COMMUNITY): Payer: Self-pay | Admitting: Hematology

## 2023-07-29 NOTE — Progress Notes (Signed)
[facility] [address], [place] [address]    Clinic Day:  07/30/2023  Referring physician: Junie Spencer, FNP  Patient Care Team: Junie Spencer, FNP as PCP - General (Family Medicine) Doreatha Massed, MD as Medical Oncologist (Medical Oncology) Therese Sarah, RN as Oncology Nurse Navigator (Medical Oncology)   ASSESSMENT & PLAN:   Assessment: 1. 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  Stools have become bloody/mucousy lately. - She also reported pain in the tailbone region since March 2023.  She also has right-sided lower back pain. - 50 pound weight loss in the last 9 months, part of weight loss was intentional.  She cut back on eating sweets and lost taste to sweets after COVID infection. - CT AP with contrast on 03/08/2022: Irregular circumferential masslike wall thickening of sigmoid colon/rectum with adjacent perirectal adenopathy.  Multiple small hypodense lesions in the liver, largest 2.9 cm in the central aspect of the liver, question metastatic disease. - Colonoscopy on 03/21/2022 by Dr. Marletta Lor: Fungating infiltrative nearly completely obstructing mass in the rectosigmoid colon, mass was circumferential measuring 4 cm in length. - Pathology: Rectal mass biopsy consistent with invasive moderately differentiated adenocarcinoma.  As there is very scant invasive tumor, MSI studies were deferred. - PET scan (03/23/2022): Hypermetabolic rectal primary long-segment with SUV 14.3.  Left posterior perirectal lymph node 7 mm with SUV 2.9.  Multiple tiny foci of hepatic hypermetabolism. - MRI of the liver (04/01/2022): Multiple small hypovascular rim-enhancing liver lesions, predominantly in the right hepatic lobe measuring up to 1.1 cm.  3.3 cm hypervascular mass in the central liver, most consistent with FNH/hepatic adenoma. - Liver biopsy (04/20/2022): Metastatic moderately  differentiated colonic adenocarcinoma with mucinous features - NGS testing shows K-ras G12 R mutation, PIK3CA exon 21 mutation.  MS-stable.  TMB-low. - Cycle 1 of FOLFOXIRI on 05/29/2022, bevacizumab added during cycle 2, cycle 13 on 11/15/2022 -MRI pelvis (10/23/2022): Known metastatic rectal cancer with persistent rectal narrowing and signs of involvement of posterior cervical stroma and anterior peritoneal reflection.  There is evidence of residual tumor in the posterior cervix.  Persistent presacral edema not substantially changed. - MRI liver (11/10/2022): 3 benign lesions and previous small rim-enhancing metastatic lesions in the liver are no longer seen. - Chemoradiation therapy with Xeloda from 12/11/2022 through 01/18/2023. - 04/04/2023: Robotic assisted LAR, diverting ileostomy, TAH with BSO - Pathology: YpT4b, ypN0, YPM1, 0/12 lymph nodes involved, no tumor deposits identified, margins negative, LVI negative, PNI positive, 1.5 cm grade 2 adenocarcinoma in the rectosigmoid colon, no perforation, MMR preserved.   2. Social/family history: - She is separated and is seen today with her daughter.  She works as a Pensions consultant at [employer].  She is current active smoker, 1 pack/day for 27 years.  Denies drinking alcohol. - Paternal aunt had colon cancer.  Maternal cousin has breast cancer.  Maternal uncle had leukemia and maternal cousin had leukemia.    Plan: Metastatic rectal cancer to the liver: - She has tolerated FOLFIRI and bevacizumab cycle 1 on 07/16/2023.  She had nausea on day 1.  Denied any vomiting diarrhea or constipation.  No bleeding reported. - She is having barium enema on Thursday and will see Dr. Maisie Fus on 08/09/2023 to discuss possibility of reversal of ileostomy. - Reviewed labs today: Normal LFTs and CBC.  CEA was 31.3.  UA was negative for protein. - We will hold  her Avastin today and for the next couple of cycles as she may undergo reversal of ileostomy.  RTC 6 weeks for  follow-up.  2.  Diarrhea: - Continue to use Imodium as needed.  Ileostomy output is stable.  3.  Peripheral neuropathy: - Tingling and numbness from ankles to toes is stable.  No numbness in the fingertips.    Orders Placed This Encounter  Procedures   Magnesium    Standing Status:   Future    Standing Expiration Date:   08/27/2024   CBC with Differential    Standing Status:   Future    Standing Expiration Date:   08/27/2024   Comprehensive metabolic panel    Standing Status:   Future    Standing Expiration Date:   08/27/2024   Magnesium    Standing Status:   Future    Standing Expiration Date:   09/10/2024   CBC with Differential    Standing Status:   Future    Standing Expiration Date:   09/10/2024   Comprehensive metabolic panel    Standing Status:   Future    Standing Expiration Date:   09/10/2024   Magnesium    Standing Status:   Future    Standing Expiration Date:   09/24/2024   CBC with Differential    Standing Status:   Future    Standing Expiration Date:   09/24/2024   Comprehensive metabolic panel    Standing Status:   Future    Standing Expiration Date:   09/24/2024   Urinalysis, dipstick only    Standing Status:   Future    Standing Expiration Date:   09/24/2024   Magnesium    Standing Status:   Future    Standing Expiration Date:   10/08/2024   CBC with Differential    Standing Status:   Future    Standing Expiration Date:   10/08/2024   Comprehensive metabolic panel    Standing Status:   Future    Standing Expiration Date:   10/08/2024   Urinalysis, dipstick only    Standing Status:   Future    Standing Expiration Date:   10/08/2024   Magnesium    Standing Status:   Future    Standing Expiration Date:   10/22/2024   CBC with Differential    Standing Status:   Future    Standing Expiration Date:   10/22/2024   Comprehensive metabolic panel    Standing Status:   Future    Standing Expiration Date:   10/22/2024   Urinalysis, dipstick only    Standing  Status:   Future    Standing Expiration Date:   10/22/2024   Magnesium    Standing Status:   Future    Standing Expiration Date:   11/05/2024   CBC with Differential    Standing Status:   Future    Standing Expiration Date:   11/05/2024   Comprehensive metabolic panel    Standing Status:   Future    Standing Expiration Date:   11/05/2024   Urinalysis, dipstick only    Standing Status:   Future    Standing Expiration Date:   11/05/2024   Magnesium    Standing Status:   Future    Standing Expiration Date:   11/19/2024   CBC with Differential    Standing Status:   Future    Standing Expiration Date:   11/19/2024   Comprehensive metabolic panel    Standing Status:   Future  Standing Expiration Date:   11/19/2024   Urinalysis, dipstick only    Standing Status:   Future    Standing Expiration Date:   11/19/2024   CEA    Standing Status:   Standing    Number of Occurrences:   10    Standing Expiration Date:   07/29/2024      I,Katie Daubenspeck,acting as a scribe for Doreatha Massed, MD.,have documented all relevant documentation on the behalf of Doreatha Massed, MD,as directed by  Doreatha Massed, MD while in the presence of Doreatha Massed, MD.   I, Doreatha Massed MD, have reviewed the above documentation for accuracy and completeness, and I agree with the above.   Doreatha Massed, MD   8/26/20245:06 PM  CHIEF COMPLAINT:   Diagnosis: metastatic rectal cancer to the liver    Cancer Staging  Rectal cancer [facility]) Staging form: Colon and Rectum, AJCC 8th Edition - Clinical stage from 03/28/2022: Stage IVA (cTX, cN1, cM1a) - Unsigned    Prior Therapy: 1. FOLFOXIRI and bevacizumab, 13 cycles, 05/29/22 - 11/15/22  2. Xeloda with radiation therapy, 12/11/22 - 01/18/23  Current Therapy:  FOLFIRI    HISTORY OF PRESENT ILLNESS:   Oncology History  Rectal cancer (HCC)  03/28/2022 Initial Diagnosis   Rectal cancer (HCC)   05/29/2022 - 07/26/2022  Chemotherapy   Patient is on Treatment Plan : COLORECTAL FOLFOXIRI + Bevacizumab q14d     05/29/2022 - 11/17/2022 Chemotherapy   Patient is on Treatment Plan : COLORECTAL FOLFOXIRI + Bevacizumab q14d      Genetic Testing   No pathogenic variants identified on the [facility] CustomNext+RNA panel. The report date is 09/08/2022.  The CustomNext-Cancer+RNAinsight panel offered by [facility] includes sequencing and rearrangement analysis for the following 47 genes:  APC, ATM, AXIN2, BARD1, BMPR1A, BRCA1, BRCA2, BRIP1, CDH1, CDK4, CDKN2A, CHEK2, DICER1, EPCAM, GREM1, HOXB13, MEN1, MLH1, MSH2, MSH3, MSH6, MUTYH, NBN, NF1, NF2, NTHL1, PALB2, PMS2, POLD1, POLE, PTEN, RAD51C, RAD51D, RECQL, RET, SDHA, SDHAF2, SDHB, SDHC, SDHD, SMAD4, SMARCA4, STK11, TP53, TSC1, TSC2, and VHL.  RNA data is routinely analyzed for use in variant interpretation for all genes.   07/16/2023 -  Chemotherapy   Patient is on Treatment Plan : COLORECTAL FOLFIRI + Bevacizumab q14d        INTERVAL HISTORY:   Joanette is a 42 y.o. female presenting to clinic today for follow up of metastatic rectal cancer to the liver. She was last seen by me on 07/05/23.  Today, she states that she is doing well overall. Her appetite level is at 100%. Her energy level is at 90%.  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   Port-A-Cath in place 05/24/2022   upper Rt chest   Tobacco abuse     Surgical History: Past Surgical History:  Procedure Laterality Date   BIOPSY  03/21/2022   Procedure: BIOPSY;  Surgeon: Lanelle Bal, DO;  [facility];  Service: Endoscopy;;   COLONOSCOPY WITH PROPOFOL N/A 03/21/2022   Procedure: COLONOSCOPY WITH PROPOFOL;  Surgeon: Lanelle Bal, DO;  Location: [facility];  Service: Endoscopy;  Laterality: N/A;  8:30am   FLEXIBLE SIGMOIDOSCOPY N/A 05/19/2022    Procedure: FLEXIBLE SIGMOIDOSCOPY WITH TATTOO INJECTION;  Surgeon: Romie Levee, MD;  Location: [facility];  Service: General;  Laterality: N/A;   ILEOSTOMY N/A 04/04/2023   Procedure:  DIVERTING ILEOSTOMY;  Surgeon: Romie Levee, MD;  Location: [facility];  Service: General;  Laterality: N/A;   LAPAROSCOPIC DIVERTED COLOSTOMY N/A 05/19/2022   Procedure: LAPAROSCOPIC DIVERTED OSTOMY;  Surgeon: Romie Levee, MD;  Location: [facility];  Service: General;  Laterality: N/A;   PORTACATH PLACEMENT Right 05/19/2022   Procedure: INSERTION PORT-A-CATH;  Surgeon: Romie Levee, MD;  Location: [facility];  Service: General;  Laterality: Right;   ROBOTIC ASSISTED TOTAL HYSTERECTOMY WITH BILATERAL SALPINGO OOPHERECTOMY N/A 04/04/2023   Procedure: XI ROBOTIC ASSISTED TOTAL HYSTERECTOMY WITH BILATERAL SALPINGO OOPHORECTOMY;  Surgeon: Carver Fila, MD;  Location: [facility];  Service: Gynecology;  Laterality: N/A;   XI ROBOTIC ASSISTED LOWER ANTERIOR RESECTION N/A 04/04/2023   Procedure: XI ROBOTIC ASSISTED LOWER ANTERIOR RESECTION;  Surgeon: Romie Levee, MD;  Location: [facility];  Service: General;  Laterality: N/A;    Social History: Social History   Socioeconomic History   Marital status: Married    Spouse name: Not on file   Number of children: Not on file   Years of education: Not on file   Highest education level: Not on file  Occupational History   Not on file  Tobacco Use   Smoking status: Every Day    Current packs/day: 0.25    Average packs/day: 0.3 packs/day for 27.0 years (6.8 ttl pk-yrs)    Types: Cigarettes   Smokeless tobacco: Never  Vaping Use   Vaping status: Never Used  Substance and Sexual Activity   Alcohol use: No   Drug use: No   Sexual activity: Yes    Birth control/protection: Injection  Other Topics Concern   Not on file  Social History Narrative   Not on file   Social Determinants of Health   Financial Resource Strain: Not on file  Food Insecurity: No Food Insecurity  (04/04/2023)   Hunger Vital Sign    Worried About Running Out of Food in the Last Year: Never true    Ran Out of Food in the Last Year: Never true  Transportation Needs: No Transportation Needs (04/04/2023)   PRAPARE - Administrator, Civil Service (Medical): No    Lack of Transportation (Non-Medical): No  Physical Activity: Not on file  Stress: Not on file  Social Connections: Not on file  Intimate Partner Violence: Not At Risk (04/04/2023)   Humiliation, Afraid, Rape, and Kick questionnaire    Fear of Current or Ex-Partner: No    Emotionally Abused: No    Physically Abused: No    Sexually Abused: No    Family History: Family History  Problem Relation Age of Onset   Heart disease Mother    Diabetes Mother    Hearing loss Father    Heart disease Father    Hyperlipidemia Father    Hypertension Father    Stroke Father    Arthritis Father    Diabetes Father    Learning disabilities Brother    Diabetes Maternal Grandmother    Heart disease Maternal Grandmother    Diabetes Maternal Grandfather    Diabetes Paternal Grandmother    Diabetes Paternal Grandfather    Leukemia Maternal Uncle        d. 58s   Colon cancer Paternal Aunt        dx 37s   Breast cancer Cousin 33   Lung cancer Maternal Aunt     Current Medications:  Current Outpatient Medications:    apixaban (ELIQUIS) 2.5 MG TABS tablet, Take 1 tablet (2.5 mg total) by  mouth 2 (two) times daily., Disp: 60 tablet, Rfl: 0   estradiol (CLIMARA) 0.1 mg/24hr patch, Place 1 patch (0.1 mg total) onto the skin once a week. Start one week after surgery, Disp: 4 patch, Rfl: 6   estradiol (ESTRACE VAGINAL) 0.1 MG/GM vaginal cream, 1 week after surgery, begin to use cream nightly at bedtime for 2 weeks then 3 times a week after to assist with healing vagina. Use a fingertip size amount of cream on finger and place slightly past vaginal entrance. Do not use the applicator that comes with cream., Disp: 42.5 g, Rfl: 3    loperamide (IMODIUM) 2 MG capsule, Take 1 capsule (2 mg total) by mouth 2 (two) times daily., Disp: 60 capsule, Rfl: 1   Multiple Vitamin (MULTIVITAMIN) tablet, Take 1 tablet by mouth daily., Disp: , Rfl:    oxyCODONE (OXY IR/ROXICODONE) 5 MG immediate release tablet, Take 1 tablet (5 mg total) by mouth every 6 (six) hours as needed for severe pain., Disp: 20 tablet, Rfl: 0   vitamin B-12 (CYANOCOBALAMIN) 250 MCG tablet, Take 250 mcg by mouth daily., Disp: , Rfl:   Current Facility-Administered Medications:    medroxyPROGESTERone (DEPO-PROVERA) injection 150 mg, 150 mg, Intramuscular, Q90 days, Hawks, Christy A, FNP, 150 mg at 09/21/22 1619  Facility-Administered Medications Ordered in Other Visits:    fluorouracil (ADRUCIL) 4,500 mg in sodium chloride 0.9 % 60 mL chemo infusion, 2,400 mg/m2 (Treatment Plan Recorded), Intravenous, 1 day or 1 dose, Doreatha Massed, MD, Infusion Verify at 07/30/23 1556   heparin lock flush 100 unit/mL, 500 Units, Intracatheter, Once PRN, Doreatha Massed, MD   sodium chloride flush (NS) 0.9 % injection 10 mL, 10 mL, Intracatheter, PRN, Doreatha Massed, MD   Allergies: Allergies  Allergen Reactions   Wellbutrin [Bupropion]     insomnia   Tramadol Rash    Mainly in chest and neck area    REVIEW OF SYSTEMS:   Review of Systems  Constitutional:  Negative for chills, fatigue and fever.  HENT:   Negative for lump/mass, mouth sores, nosebleeds, sore throat and trouble swallowing.   Eyes:  Negative for eye problems.  Respiratory:  Negative for cough and shortness of breath.   Cardiovascular:  Negative for chest pain, leg swelling and palpitations.  Gastrointestinal:  Negative for abdominal pain, constipation, diarrhea, nausea and vomiting.  Genitourinary:  Negative for bladder incontinence, difficulty urinating, dysuria, frequency, hematuria and nocturia.   Musculoskeletal:  Negative for arthralgias, back pain, flank pain, myalgias and neck pain.   Skin:  Negative for itching and rash.  Neurological:  Positive for numbness. Negative for dizziness and headaches.  Hematological:  Does not bruise/bleed easily.  Psychiatric/Behavioral:  Negative for depression, sleep disturbance and suicidal ideas. The patient is not nervous/anxious.   All other systems reviewed and are negative.    VITALS:   Last menstrual period 04/19/2022.  Wt Readings from Last 3 Encounters:  07/30/23 157 lb 3.2 oz (71.3 kg)  07/16/23 158 lb 6.4 oz (71.8 kg)  07/05/23 159 lb 8 oz (72.3 kg)    There is no height or weight on file to calculate BMI.  Performance status (ECOG): 0 - Asymptomatic  PHYSICAL EXAM:   Physical Exam Vitals and nursing note reviewed. Exam conducted with a chaperone present.  Constitutional:      Appearance: Normal appearance.  Cardiovascular:     Rate and Rhythm: Normal rate and regular rhythm.     Pulses: Normal pulses.     Heart sounds: Normal heart  sounds.  Pulmonary:     Effort: Pulmonary effort is normal.     Breath sounds: Normal breath sounds.  Abdominal:     Palpations: Abdomen is soft. There is no hepatomegaly, splenomegaly or mass.     Tenderness: There is no abdominal tenderness.  Musculoskeletal:     Right lower leg: No edema.     Left lower leg: No edema.  Lymphadenopathy:     Cervical: No cervical adenopathy.     Right cervical: No superficial, deep or posterior cervical adenopathy.    Left cervical: No superficial, deep or posterior cervical adenopathy.     Upper Body:     Right upper body: No supraclavicular or axillary adenopathy.     Left upper body: No supraclavicular or axillary adenopathy.  Neurological:     General: No focal deficit present.     Mental Status: She is alert and oriented to person, place, and time.  Psychiatric:        Mood and Affect: Mood normal.        Behavior: Behavior normal.     LABS:      Latest Ref Rng & Units 07/30/2023    9:06 AM 07/16/2023    8:02 AM 06/29/2023    12:05 PM  CBC  WBC 4.0 - 10.5 K/uL 4.7  6.4  7.4   Hemoglobin 12.0 - 15.0 g/dL 16.1  09.6  04.5   Hematocrit 36.0 - 46.0 % 40.4  38.8  41.2   Platelets 150 - 400 K/uL 236  248  304       Latest Ref Rng & Units 07/30/2023    9:06 AM 07/16/2023    8:02 AM 06/29/2023   12:05 PM  CMP  Glucose 70 - 99 mg/dL 86  90  84   BUN 6 - 20 mg/dL 11  10  8    Creatinine 0.44 - 1.00 mg/dL 4.09  8.11  9.14   Sodium 135 - 145 mmol/L 135  136  135   Potassium 3.5 - 5.1 mmol/L 3.8  3.9  3.7   Chloride 98 - 111 mmol/L 105  104  102   CO2 22 - 32 mmol/L 23  22  23    Calcium 8.9 - 10.3 mg/dL 9.3  9.1  9.1   Total Protein 6.5 - 8.1 g/dL 6.9  6.4  7.4   Total Bilirubin 0.3 - 1.2 mg/dL 0.4  0.4  0.7   Alkaline Phos 38 - 126 U/L 78  62  74   AST 15 - 41 U/L 15  15  14    ALT 0 - 44 U/L 17  17  16       Lab Results  Component Value Date   CEA1 31.3 (H) 06/29/2023   /  CEA  Date Value Ref Range Status  06/29/2023 31.3 (H) 0.0 - 4.7 ng/mL Final    Comment:    (NOTE)                             Nonsmokers          <3.9                             Smokers             <5.6 Roche [employer] Electrochemiluminescence Immunoassay (ECLIA) Values obtained with different assay methods or kits cannot be used interchangeably.  Results cannot  be interpreted as absolute evidence of the presence or absence of malignant disease. Performed At: [facility] [address] [place], [place] [id number] Jolene Schimke MD OZ:[id number]    No results found for: "PSA1" No results found for: [phone]" No results found for: "CAN125"  No results found for: "TOTALPROTELP", "ALBUMINELP", "A1GS", "A2GS", "BETS", "BETA2SER", "GAMS", "MSPIKE", "SPEI" No results found for: "TIBC", "FERRITIN", "IRONPCTSAT" No results found for: "LDH"   STUDIES:   No results found.

## 2023-07-30 ENCOUNTER — Inpatient Hospital Stay: Payer: BC Managed Care – PPO | Admitting: Hematology

## 2023-07-30 ENCOUNTER — Inpatient Hospital Stay: Payer: BC Managed Care – PPO

## 2023-07-30 VITALS — BP 105/58 | HR 62 | Temp 96.7°F | Resp 18

## 2023-07-30 VITALS — BP 111/73 | HR 79 | Temp 96.8°F | Resp 18 | Wt 157.2 lb

## 2023-07-30 DIAGNOSIS — C20 Malignant neoplasm of rectum: Secondary | ICD-10-CM

## 2023-07-30 DIAGNOSIS — Z5111 Encounter for antineoplastic chemotherapy: Secondary | ICD-10-CM | POA: Diagnosis not present

## 2023-07-30 DIAGNOSIS — Z95828 Presence of other vascular implants and grafts: Secondary | ICD-10-CM

## 2023-07-30 LAB — CBC WITH DIFFERENTIAL/PLATELET
Abs Immature Granulocytes: 0.01 10*3/uL (ref 0.00–0.07)
Basophils Absolute: 0 10*3/uL (ref 0.0–0.1)
Basophils Relative: 0 %
Eosinophils Absolute: 0.2 10*3/uL (ref 0.0–0.5)
Eosinophils Relative: 5 %
HCT: 40.4 % (ref 36.0–46.0)
Hemoglobin: 13.2 g/dL (ref 12.0–15.0)
Immature Granulocytes: 0 %
Lymphocytes Relative: 17 %
Lymphs Abs: 0.8 10*3/uL (ref 0.7–4.0)
MCH: 31.1 pg (ref 26.0–34.0)
MCHC: 32.7 g/dL (ref 30.0–36.0)
MCV: 95.3 fL (ref 80.0–100.0)
Monocytes Absolute: 0.2 10*3/uL (ref 0.1–1.0)
Monocytes Relative: 5 %
Neutro Abs: 3.4 10*3/uL (ref 1.7–7.7)
Neutrophils Relative %: 73 %
Platelets: 236 10*3/uL (ref 150–400)
RBC: 4.24 MIL/uL (ref 3.87–5.11)
RDW: 14.1 % (ref 11.5–15.5)
WBC: 4.7 10*3/uL (ref 4.0–10.5)
nRBC: 0 % (ref 0.0–0.2)

## 2023-07-30 LAB — COMPREHENSIVE METABOLIC PANEL
ALT: 17 U/L (ref 0–44)
AST: 15 U/L (ref 15–41)
Albumin: 3.7 g/dL (ref 3.5–5.0)
Alkaline Phosphatase: 78 U/L (ref 38–126)
Anion gap: 7 (ref 5–15)
BUN: 11 mg/dL (ref 6–20)
CO2: 23 mmol/L (ref 22–32)
Calcium: 9.3 mg/dL (ref 8.9–10.3)
Chloride: 105 mmol/L (ref 98–111)
Creatinine, Ser: 0.66 mg/dL (ref 0.44–1.00)
GFR, Estimated: >60 mL/min (ref >60)
Glucose, Bld: 86 mg/dL (ref 70–99)
Potassium: 3.8 mmol/L (ref 3.5–5.1)
Sodium: 135 mmol/L (ref 135–145)
Total Bilirubin: 0.4 mg/dL (ref 0.3–1.2)
Total Protein: 6.9 g/dL (ref 6.5–8.1)

## 2023-07-30 LAB — MAGNESIUM: Magnesium: 2 mg/dL (ref 1.7–2.4)

## 2023-07-30 MED ORDER — SODIUM CHLORIDE 0.9 % IV SOLN
10.0000 mg | Freq: Once | INTRAVENOUS | Status: AC
  Administered 2023-07-30: 10 mg via INTRAVENOUS
  Filled 2023-07-30: qty 10

## 2023-07-30 MED ORDER — SODIUM CHLORIDE 0.9% FLUSH: 10.0000 mL | INTRAVENOUS | Status: DC | PRN

## 2023-07-30 MED ORDER — SODIUM CHLORIDE 0.9 % IV SOLN
2400.0000 mg/m2 | INTRAVENOUS | Status: DC
  Administered 2023-07-30: 4500 mg via INTRAVENOUS
  Filled 2023-07-30: qty 90

## 2023-07-30 MED ORDER — SODIUM CHLORIDE 0.9 % IV SOLN
400.0000 mg/m2 | Freq: Once | INTRAVENOUS | Status: AC
  Administered 2023-07-30: 748 mg via INTRAVENOUS
  Filled 2023-07-30: qty 37.4

## 2023-07-30 MED ORDER — SODIUM CHLORIDE 0.9 % IV SOLN: Freq: Once | INTRAVENOUS | Status: AC

## 2023-07-30 MED ORDER — SODIUM CHLORIDE 0.9 % IV SOLN
180.0000 mg/m2 | Freq: Once | INTRAVENOUS | Status: AC
  Administered 2023-07-30: 340 mg via INTRAVENOUS
  Filled 2023-07-30: qty 17

## 2023-07-30 MED ORDER — PALONOSETRON HCL INJECTION 0.25 MG/5ML
0.2500 mg | Freq: Once | INTRAVENOUS | Status: AC
  Administered 2023-07-30: 0.25 mg via INTRAVENOUS
  Filled 2023-07-30: qty 5

## 2023-07-30 MED ORDER — HEPARIN SOD (PORK) LOCK FLUSH 100 UNIT/ML IV SOLN: 500.0000 [IU] | Freq: Once | INTRAVENOUS | Status: DC | PRN

## 2023-07-30 MED ORDER — FLUOROURACIL CHEMO INJECTION 2.5 GM/50ML
400.0000 mg/m2 | Freq: Once | INTRAVENOUS | Status: AC
  Administered 2023-07-30: 750 mg via INTRAVENOUS
  Filled 2023-07-30: qty 15

## 2023-07-30 NOTE — Patient Instructions (Signed)
[facility]  Discharge Instructions: Thank you for choosing [facility] to provide your oncology and hematology care.  If you have a lab appointment with the Cancer Center - please note that after April 8th, 2024, all labs will be drawn in the cancer center.  You do not have to check in or register with the main entrance as you have in the past but will complete your check-in in the cancer center.  Wear comfortable clothing and clothing appropriate for easy access to any Portacath or PICC line.   We strive to give you quality time with your provider. You may need to reschedule your appointment if you arrive late (15 or more minutes).  Arriving late affects you and other patients whose appointments are after yours.  Also, if you miss three or more appointments without notifying the office, you may be dismissed from the clinic at the provider's discretion.      For prescription refill requests, have your pharmacy contact our office and allow 72 hours for refills to be completed.    Today you received the following chemotherapy and/or immunotherapy agents Folfiri   To help prevent nausea and vomiting after your treatment, we encourage you to take your nausea medication as directed.    BELOW ARE SYMPTOMS THAT SHOULD BE REPORTED IMMEDIATELY: *FEVER GREATER THAN 100.4 F (38 C) OR HIGHER *CHILLS OR SWEATING *NAUSEA AND VOMITING THAT IS NOT CONTROLLED WITH YOUR NAUSEA MEDICATION *UNUSUAL SHORTNESS OF BREATH *UNUSUAL BRUISING OR BLEEDING *URINARY PROBLEMS (pain or burning when urinating, or frequent urination) *BOWEL PROBLEMS (unusual diarrhea, constipation, pain near the anus) TENDERNESS IN MOUTH AND THROAT WITH OR WITHOUT PRESENCE OF ULCERS (sore throat, sores in mouth, or a toothache) UNUSUAL RASH, SWELLING OR PAIN  UNUSUAL VAGINAL DISCHARGE OR ITCHING   Items with * indicate a potential emergency and should be followed up as soon as possible or go to the  Emergency Department if any problems should occur.  Please show the CHEMOTHERAPY ALERT CARD or IMMUNOTHERAPY ALERT CARD at check-in to the Emergency Department and triage nurse.  Should you have questions after your visit or need to cancel or reschedule your appointment, please contact [facility] [phone]  and follow the prompts.  Office hours are 8:00 a.m. to 4:30 p.m. Monday - Friday. Please note that voicemails left after 4:00 p.m. may not be returned until the following business day.  We are closed weekends and major holidays. You have access to a nurse at all times for urgent questions. Please call the main number to the clinic [phone] and follow the prompts.  For any non-urgent questions, you may also contact your provider using MyChart. We now offer e-Visits for anyone 27 and older to request care online for non-urgent symptoms. For details visit [email or web address].   Also download the MyChart app! Go to the app store, search "MyChart", open the app, select [facility], and log in with your MyChart username and password.

## 2023-07-30 NOTE — Progress Notes (Signed)
Patient has been examined by Dr. Katragadda. Vital signs and labs have been reviewed by MD - ANC, Creatinine, LFTs, hemoglobin, and platelets are within treatment parameters per M.D. - pt may proceed with treatment.  Primary RN and pharmacy notified.  

## 2023-07-30 NOTE — Progress Notes (Signed)
Patient presents today for chemotherapy infusion of Campstor, leucovorin, and Adrucil. Patient is in satisfactory condition with no new complaints voiced.  Vital signs are stable.  Labs reviewed by Dr. Ellin Saba during the office visit and all labs are within treatment parameters. Avastin to be held today due to upcoming sx. We will proceed with treatment per MD orders.

## 2023-07-30 NOTE — Patient Instructions (Signed)
[facility] at [facility] Discharge Instructions   You were seen and examined today by Dr. Ellin Saba.  He reviewed the results of your lab work which are normal/stable.   We will proceed with your treatment today. We will hold Avastin until after your surgery to allow you to heal properly. The Avastin can cause a delay in wound healing.   Return as scheduled.    Thank you for choosing [facility] at [facility] to provide your oncology and hematology care.  To afford each patient quality time with our provider, please arrive at least 15 minutes before your scheduled appointment time.   If you have a lab appointment with the Cancer Center please come in thru the Main Entrance and check in at the main information desk.  You need to re-schedule your appointment should you arrive 10 or more minutes late.  We strive to give you quality time with our providers, and arriving late affects you and other patients whose appointments are after yours.  Also, if you no show three or more times for appointments you may be dismissed from the clinic at the providers discretion.     Again, thank you for choosing [facility].  Our hope is that these requests will decrease the amount of time that you wait before being seen by our physicians.       _____________________________________________________________  Should you have questions after your visit to [facility], please contact our office at [phone] and follow the prompts.  Our office hours are 8:00 a.m. and 4:30 p.m. Monday - Friday.  Please note that voicemails left after 4:00 p.m. may not be returned until the following business day.  We are closed weekends and major holidays.  You do have access to a nurse 24-7, just call the main number to the clinic [phone] and do not press any options, hold on the line and a nurse will answer the phone.    For prescription refill  requests, have your pharmacy contact our office and allow 72 hours.    Due to Covid, you will need to wear a mask upon entering the hospital. If you do not have a mask, a mask will be given to you at the Main Entrance upon arrival. For doctor visits, patients may have 1 support person age 15 or older with them. For treatment visits, patients can not have anyone with them due to social distancing guidelines and our immunocompromised population.

## 2023-07-30 NOTE — Progress Notes (Signed)
Treatment given today per MD orders. Tolerated infusion without adverse affects. Vital signs stable. No complaints at this time. Discharged from clinic ambulatory in stable condition. Alert and oriented x 3. F/U with [facility] as scheduled. 5FU ambulatory pump infusing.

## 2023-07-31 ENCOUNTER — Other Ambulatory Visit: Payer: Self-pay

## 2023-07-31 LAB — CEA: CEA: 10.6 ng/mL — ABNORMAL HIGH (ref 0.0–4.7)

## 2023-08-01 ENCOUNTER — Inpatient Hospital Stay: Payer: BC Managed Care – PPO

## 2023-08-01 VITALS — BP 115/66 | HR 87 | Temp 97.8°F | Resp 18

## 2023-08-01 DIAGNOSIS — C20 Malignant neoplasm of rectum: Secondary | ICD-10-CM

## 2023-08-01 DIAGNOSIS — Z5111 Encounter for antineoplastic chemotherapy: Secondary | ICD-10-CM | POA: Diagnosis not present

## 2023-08-01 DIAGNOSIS — Z95828 Presence of other vascular implants and grafts: Secondary | ICD-10-CM

## 2023-08-01 MED ORDER — HEPARIN SOD (PORK) LOCK FLUSH 100 UNIT/ML IV SOLN
500.0000 [IU] | Freq: Once | INTRAVENOUS | Status: AC | PRN
  Administered 2023-08-01: 500 [IU]

## 2023-08-01 MED ORDER — SODIUM CHLORIDE 0.9% FLUSH
10.0000 mL | INTRAVENOUS | Status: DC | PRN
  Administered 2023-08-01: 10 mL

## 2023-08-01 NOTE — Progress Notes (Signed)
Patient here today for port flush and pump d/c per MD orders.  Her pump was empty upon arrival.  She denies any issues over the last few days.  She did say that she has had a runny nose and we discussed that this is a side effect of her treatment.  She is aware that she will continue treatment every 2 weeks and will return to see the provider in 6 weeks.  She remained stable during pump d/c / port flush.  She was discharged ambulatory and in stable condition.

## 2023-08-01 NOTE — Patient Instructions (Signed)
Your pump was d/c today.  Follow up as instructed

## 2023-08-02 ENCOUNTER — Ambulatory Visit (HOSPITAL_COMMUNITY)
Admission: RE | Admit: 2023-08-02 | Discharge: 2023-08-02 | Disposition: A | Discharge status: Home / Self Care | Payer: BC Managed Care – PPO | Source: Ambulatory Visit | Attending: General Surgery | Admitting: General Surgery

## 2023-08-02 DIAGNOSIS — K913 Postprocedural intestinal obstruction, unspecified as to partial versus complete: Secondary | ICD-10-CM | POA: Insufficient documentation

## 2023-08-02 MED ORDER — IOHEXOL 300 MG/ML  SOLN
200.0000 mL | Freq: Once | INTRAMUSCULAR | Status: AC | PRN
  Administered 2023-08-02: 200 mL

## 2023-08-13 ENCOUNTER — Inpatient Hospital Stay: Payer: BC Managed Care – PPO | Admitting: Hematology

## 2023-08-13 ENCOUNTER — Inpatient Hospital Stay: Payer: BC Managed Care – PPO

## 2023-08-13 ENCOUNTER — Inpatient Hospital Stay: Payer: BC Managed Care – PPO | Attending: Hematology

## 2023-08-13 VITALS — BP 108/63 | HR 61 | Temp 97.7°F | Resp 18

## 2023-08-13 DIAGNOSIS — Z95828 Presence of other vascular implants and grafts: Secondary | ICD-10-CM

## 2023-08-13 DIAGNOSIS — C20 Malignant neoplasm of rectum: Secondary | ICD-10-CM | POA: Insufficient documentation

## 2023-08-13 DIAGNOSIS — Z5111 Encounter for antineoplastic chemotherapy: Secondary | ICD-10-CM | POA: Diagnosis present

## 2023-08-13 DIAGNOSIS — C787 Secondary malignant neoplasm of liver and intrahepatic bile duct: Secondary | ICD-10-CM | POA: Insufficient documentation

## 2023-08-13 LAB — COMPREHENSIVE METABOLIC PANEL
ALT: 20 U/L (ref 0–44)
AST: 16 U/L (ref 15–41)
Albumin: 3.7 g/dL (ref 3.5–5.0)
Alkaline Phosphatase: 70 U/L (ref 38–126)
Anion gap: 9 (ref 5–15)
BUN: 9 mg/dL (ref 6–20)
CO2: 23 mmol/L (ref 22–32)
Calcium: 8.9 mg/dL (ref 8.9–10.3)
Chloride: 105 mmol/L (ref 98–111)
Creatinine, Ser: 0.72 mg/dL (ref 0.44–1.00)
GFR, Estimated: >60 mL/min (ref >60)
Glucose, Bld: 98 mg/dL (ref 70–99)
Potassium: 3.6 mmol/L (ref 3.5–5.1)
Sodium: 137 mmol/L (ref 135–145)
Total Bilirubin: 0.4 mg/dL (ref 0.3–1.2)
Total Protein: 6.5 g/dL (ref 6.5–8.1)

## 2023-08-13 LAB — CBC WITH DIFFERENTIAL/PLATELET
Abs Immature Granulocytes: 0.01 10*3/uL (ref 0.00–0.07)
Basophils Absolute: 0 10*3/uL (ref 0.0–0.1)
Basophils Relative: 1 %
Eosinophils Absolute: 0.1 10*3/uL (ref 0.0–0.5)
Eosinophils Relative: 3 %
HCT: 40.3 % (ref 36.0–46.0)
Hemoglobin: 13.2 g/dL (ref 12.0–15.0)
Immature Granulocytes: 0 %
Lymphocytes Relative: 15 %
Lymphs Abs: 0.7 10*3/uL (ref 0.7–4.0)
MCH: 31.8 pg (ref 26.0–34.0)
MCHC: 32.8 g/dL (ref 30.0–36.0)
MCV: 97.1 fL (ref 80.0–100.0)
Monocytes Absolute: 0.2 10*3/uL (ref 0.1–1.0)
Monocytes Relative: 5 %
Neutro Abs: 3.2 10*3/uL (ref 1.7–7.7)
Neutrophils Relative %: 76 %
Platelets: 208 10*3/uL (ref 150–400)
RBC: 4.15 MIL/uL (ref 3.87–5.11)
RDW: 14 % (ref 11.5–15.5)
WBC: 4.2 10*3/uL (ref 4.0–10.5)
nRBC: 0 % (ref 0.0–0.2)

## 2023-08-13 LAB — URINALYSIS, DIPSTICK ONLY
Bilirubin Urine: NEGATIVE
Glucose, UA: NEGATIVE mg/dL
Ketones, ur: NEGATIVE mg/dL
Nitrite: NEGATIVE
Protein, ur: NEGATIVE mg/dL
Specific Gravity, Urine: 1.02 (ref 1.005–1.030)
pH: 5 (ref 5.0–8.0)

## 2023-08-13 LAB — MAGNESIUM: Magnesium: 2 mg/dL (ref 1.7–2.4)

## 2023-08-13 MED ORDER — PALONOSETRON HCL INJECTION 0.25 MG/5ML
0.2500 mg | Freq: Once | INTRAVENOUS | Status: AC
  Administered 2023-08-13: 0.25 mg via INTRAVENOUS
  Filled 2023-08-13: qty 5

## 2023-08-13 MED ORDER — ATROPINE SULFATE 1 MG/ML IV SOLN
0.5000 mg | Freq: Once | INTRAVENOUS | Status: AC | PRN
  Administered 2023-08-13: 0.5 mg via INTRAVENOUS
  Filled 2023-08-13: qty 1

## 2023-08-13 MED ORDER — SODIUM CHLORIDE 0.9 % IV SOLN
10.0000 mg | Freq: Once | INTRAVENOUS | Status: AC
  Administered 2023-08-13: 10 mg via INTRAVENOUS
  Filled 2023-08-13: qty 1

## 2023-08-13 MED ORDER — HEPARIN SOD (PORK) LOCK FLUSH 100 UNIT/ML IV SOLN: 500.0000 [IU] | Freq: Once | INTRAVENOUS | Status: DC | PRN

## 2023-08-13 MED ORDER — SODIUM CHLORIDE 0.9 % IV SOLN
2400.0000 mg/m2 | INTRAVENOUS | Status: DC
  Administered 2023-08-13: 4500 mg via INTRAVENOUS
  Filled 2023-08-13: qty 90

## 2023-08-13 MED ORDER — SODIUM CHLORIDE 0.9 % IV SOLN: Freq: Once | INTRAVENOUS | Status: AC

## 2023-08-13 MED ORDER — SODIUM CHLORIDE 0.9 % IV SOLN
180.0000 mg/m2 | Freq: Once | INTRAVENOUS | Status: AC
  Administered 2023-08-13: 340 mg via INTRAVENOUS
  Filled 2023-08-13: qty 17

## 2023-08-13 MED ORDER — SODIUM CHLORIDE 0.9% FLUSH: 10.0000 mL | INTRAVENOUS | Status: DC | PRN

## 2023-08-13 MED ORDER — FLUOROURACIL CHEMO INJECTION 2.5 GM/50ML
400.0000 mg/m2 | Freq: Once | INTRAVENOUS | Status: AC
  Administered 2023-08-13: 750 mg via INTRAVENOUS
  Filled 2023-08-13: qty 15

## 2023-08-13 MED ORDER — SODIUM CHLORIDE 0.9 % IV SOLN
400.0000 mg/m2 | Freq: Once | INTRAVENOUS | Status: AC
  Administered 2023-08-13: 748 mg via INTRAVENOUS
  Filled 2023-08-13: qty 37.4

## 2023-08-13 MED ORDER — SODIUM CHLORIDE 0.9% FLUSH
10.0000 mL | Freq: Once | INTRAVENOUS | Status: AC
  Administered 2023-08-13: 10 mL via INTRAVENOUS

## 2023-08-13 NOTE — Progress Notes (Signed)
Patients port flushed without difficulty.  Good blood return noted with no bruising or swelling noted at site.  Stable during access and blood draw.  Patient to remain accessed for treatment. 

## 2023-08-13 NOTE — Progress Notes (Signed)
Patient presents today for [facility] with pump start per providers order.  Vital signs within parameters for treatment.  Labs pending.  Patient has no new complaints at this time.  Labs within parameters for treatment.  Treatment given today per MD orders.  Stable during infusion without adverse affects.  Vital signs stable.  5FU pump connected and verified RUN on the screen with the patient.  No complaints at this time.  Discharge from clinic ambulatory in stable condition.  Alert and oriented X 3.  Follow up with [facility] as scheduled.

## 2023-08-13 NOTE — Patient Instructions (Signed)
[facility]  Discharge Instructions: Thank you for choosing [facility] to provide your oncology and hematology care.  If you have a lab appointment with the Cancer Center - please note that after April 8th, 2024, all labs will be drawn in the cancer center.  You do not have to check in or register with the main entrance as you have in the past but will complete your check-in in the cancer center.  Wear comfortable clothing and clothing appropriate for easy access to any Portacath or PICC line.   We strive to give you quality time with your provider. You may need to reschedule your appointment if you arrive late (15 or more minutes).  Arriving late affects you and other patients whose appointments are after yours.  Also, if you miss three or more appointments without notifying the office, you may be dismissed from the clinic at the provider's discretion.      For prescription refill requests, have your pharmacy contact our office and allow 72 hours for refills to be completed.    Today you received the following chemotherapy and/or immunotherapy agents Folfiri with 5FU pump start      To help prevent nausea and vomiting after your treatment, we encourage you to take your nausea medication as directed.  BELOW ARE SYMPTOMS THAT SHOULD BE REPORTED IMMEDIATELY: *FEVER GREATER THAN 100.4 F (38 C) OR HIGHER *CHILLS OR SWEATING *NAUSEA AND VOMITING THAT IS NOT CONTROLLED WITH YOUR NAUSEA MEDICATION *UNUSUAL SHORTNESS OF BREATH *UNUSUAL BRUISING OR BLEEDING *URINARY PROBLEMS (pain or burning when urinating, or frequent urination) *BOWEL PROBLEMS (unusual diarrhea, constipation, pain near the anus) TENDERNESS IN MOUTH AND THROAT WITH OR WITHOUT PRESENCE OF ULCERS (sore throat, sores in mouth, or a toothache) UNUSUAL RASH, SWELLING OR PAIN  UNUSUAL VAGINAL DISCHARGE OR ITCHING   Items with * indicate a potential emergency and should be followed up as soon as  possible or go to the Emergency Department if any problems should occur.  Please show the CHEMOTHERAPY ALERT CARD or IMMUNOTHERAPY ALERT CARD at check-in to the Emergency Department and triage nurse.  Should you have questions after your visit or need to cancel or reschedule your appointment, please contact [facility] CENTER AT [facility] [phone]  and follow the prompts.  Office hours are 8:00 a.m. to 4:30 p.m. Monday - Friday. Please note that voicemails left after 4:00 p.m. may not be returned until the following business day.  We are closed weekends and major holidays. You have access to a nurse at all times for urgent questions. Please call the main number to the clinic [phone] and follow the prompts.  For any non-urgent questions, you may also contact your provider using MyChart. We now offer e-Visits for anyone 33 and older to request care online for non-urgent symptoms. For details visit [email or web address].   Also download the MyChart app! Go to the app store, search "MyChart", open the app, select [place], and log in with your MyChart username and password.

## 2023-08-15 ENCOUNTER — Inpatient Hospital Stay: Payer: BC Managed Care – PPO

## 2023-08-15 VITALS — BP 103/62 | HR 102 | Temp 96.6°F | Resp 18

## 2023-08-15 DIAGNOSIS — Z95828 Presence of other vascular implants and grafts: Secondary | ICD-10-CM

## 2023-08-15 DIAGNOSIS — C20 Malignant neoplasm of rectum: Secondary | ICD-10-CM

## 2023-08-15 DIAGNOSIS — Z5111 Encounter for antineoplastic chemotherapy: Secondary | ICD-10-CM | POA: Diagnosis not present

## 2023-08-15 MED ORDER — HEPARIN SOD (PORK) LOCK FLUSH 100 UNIT/ML IV SOLN
500.0000 [IU] | Freq: Once | INTRAVENOUS | Status: AC | PRN
  Administered 2023-08-15: 500 [IU]

## 2023-08-15 MED ORDER — SODIUM CHLORIDE 0.9% FLUSH
10.0000 mL | INTRAVENOUS | Status: DC | PRN
  Administered 2023-08-15: 10 mL

## 2023-08-15 NOTE — Progress Notes (Signed)
Patient presents today for 5FU chemotherapy disconnection per provider's order. Vital signs stable and pt voiced no new complaints at this time. Port flushed easily without difficulty with 10 mL of normal saline and 5 mL of heparin. Good blood return noted and needle removed intact. No bruising or swelling noted at the site.  Discharged from clinic ambulatory in stable condition. Alert and oriented x 3. F/U with [facility] as scheduled.

## 2023-08-15 NOTE — Patient Instructions (Signed)
[facility]  Discharge Instructions: Thank you for choosing [facility] to provide your oncology and hematology care.  If you have a lab appointment with the Cancer Center - please note that after April 8th, 2024, all labs will be drawn in the cancer center.  You do not have to check in or register with the main entrance as you have in the past but will complete your check-in in the cancer center.  Wear comfortable clothing and clothing appropriate for easy access to any Portacath or PICC line.   We strive to give you quality time with your provider. You may need to reschedule your appointment if you arrive late (15 or more minutes).  Arriving late affects you and other patients whose appointments are after yours.  Also, if you miss three or more appointments without notifying the office, you may be dismissed from the clinic at the provider's discretion.      For prescription refill requests, have your pharmacy contact our office and allow 72 hours for refills to be completed.    Today you received 5FU chemotherapy pump disconnection.   BELOW ARE SYMPTOMS THAT SHOULD BE REPORTED IMMEDIATELY: *FEVER GREATER THAN 100.4 F (38 C) OR HIGHER *CHILLS OR SWEATING *NAUSEA AND VOMITING THAT IS NOT CONTROLLED WITH YOUR NAUSEA MEDICATION *UNUSUAL SHORTNESS OF BREATH *UNUSUAL BRUISING OR BLEEDING *URINARY PROBLEMS (pain or burning when urinating, or frequent urination) *BOWEL PROBLEMS (unusual diarrhea, constipation, pain near the anus) TENDERNESS IN MOUTH AND THROAT WITH OR WITHOUT PRESENCE OF ULCERS (sore throat, sores in mouth, or a toothache) UNUSUAL RASH, SWELLING OR PAIN  UNUSUAL VAGINAL DISCHARGE OR ITCHING   Items with * indicate a potential emergency and should be followed up as soon as possible or go to the Emergency Department if any problems should occur.  Please show the CHEMOTHERAPY ALERT CARD or IMMUNOTHERAPY ALERT CARD at check-in to the Emergency  Department and triage nurse.  Should you have questions after your visit or need to cancel or reschedule your appointment, please contact [facility] CENTER AT [facility] [phone]  and follow the prompts.  Office hours are 8:00 a.m. to 4:30 p.m. Monday - Friday. Please note that voicemails left after 4:00 p.m. may not be returned until the following business day.  We are closed weekends and major holidays. You have access to a nurse at all times for urgent questions. Please call the main number to the clinic [phone] and follow the prompts.  For any non-urgent questions, you may also contact your provider using MyChart. We now offer e-Visits for anyone 3 and older to request care online for non-urgent symptoms. For details visit [email or web address].   Also download the MyChart app! Go to the app store, search "MyChart", open the app, select [place], and log in with your MyChart username and password.

## 2023-08-25 ENCOUNTER — Other Ambulatory Visit: Payer: Self-pay

## 2023-08-27 ENCOUNTER — Inpatient Hospital Stay: Payer: BC Managed Care – PPO

## 2023-08-29 ENCOUNTER — Inpatient Hospital Stay: Payer: BC Managed Care – PPO

## 2023-08-29 VITALS — BP 92/58 | HR 65 | Temp 98.0°F | Resp 18

## 2023-08-29 DIAGNOSIS — C20 Malignant neoplasm of rectum: Secondary | ICD-10-CM

## 2023-08-29 DIAGNOSIS — Z5111 Encounter for antineoplastic chemotherapy: Secondary | ICD-10-CM | POA: Diagnosis not present

## 2023-08-29 DIAGNOSIS — Z95828 Presence of other vascular implants and grafts: Secondary | ICD-10-CM

## 2023-08-29 LAB — COMPREHENSIVE METABOLIC PANEL
ALT: 29 U/L (ref 0–44)
AST: 18 U/L (ref 15–41)
Albumin: 3.5 g/dL (ref 3.5–5.0)
Alkaline Phosphatase: 89 U/L (ref 38–126)
Anion gap: 7 (ref 5–15)
BUN: 10 mg/dL (ref 6–20)
CO2: 24 mmol/L (ref 22–32)
Calcium: 9.3 mg/dL (ref 8.9–10.3)
Chloride: 105 mmol/L (ref 98–111)
Creatinine, Ser: 0.76 mg/dL (ref 0.44–1.00)
GFR, Estimated: >60 mL/min (ref >60)
Glucose, Bld: 111 mg/dL — ABNORMAL HIGH (ref 70–99)
Potassium: 4 mmol/L (ref 3.5–5.1)
Sodium: 136 mmol/L (ref 135–145)
Total Bilirubin: 0.7 mg/dL (ref 0.3–1.2)
Total Protein: 6.6 g/dL (ref 6.5–8.1)

## 2023-08-29 LAB — CBC WITH DIFFERENTIAL/PLATELET
Abs Immature Granulocytes: 0.02 10*3/uL (ref 0.00–0.07)
Basophils Absolute: 0 10*3/uL (ref 0.0–0.1)
Basophils Relative: 1 %
Eosinophils Absolute: 0.3 10*3/uL (ref 0.0–0.5)
Eosinophils Relative: 6 %
HCT: 40.6 % (ref 36.0–46.0)
Hemoglobin: 13.5 g/dL (ref 12.0–15.0)
Immature Granulocytes: 1 %
Lymphocytes Relative: 17 %
Lymphs Abs: 0.7 10*3/uL (ref 0.7–4.0)
MCH: 31.8 pg (ref 26.0–34.0)
MCHC: 33.3 g/dL (ref 30.0–36.0)
MCV: 95.5 fL (ref 80.0–100.0)
Monocytes Absolute: 0.3 10*3/uL (ref 0.1–1.0)
Monocytes Relative: 8 %
Neutro Abs: 2.8 10*3/uL (ref 1.7–7.7)
Neutrophils Relative %: 67 %
Platelets: 251 10*3/uL (ref 150–400)
RBC: 4.25 MIL/uL (ref 3.87–5.11)
RDW: 13.9 % (ref 11.5–15.5)
WBC: 4.2 10*3/uL (ref 4.0–10.5)
nRBC: 0 % (ref 0.0–0.2)

## 2023-08-29 LAB — MAGNESIUM: Magnesium: 2 mg/dL (ref 1.7–2.4)

## 2023-08-29 MED ORDER — SODIUM CHLORIDE 0.9 % IV SOLN
2400.0000 mg/m2 | INTRAVENOUS | Status: DC
  Administered 2023-08-29: 4500 mg via INTRAVENOUS
  Filled 2023-08-29: qty 90

## 2023-08-29 MED ORDER — SODIUM CHLORIDE 0.9 % IV SOLN
400.0000 mg/m2 | Freq: Once | INTRAVENOUS | Status: AC
  Administered 2023-08-29: 748 mg via INTRAVENOUS
  Filled 2023-08-29: qty 37.4

## 2023-08-29 MED ORDER — SODIUM CHLORIDE 0.9 % IV SOLN
10.0000 mg | Freq: Once | INTRAVENOUS | Status: AC
  Administered 2023-08-29: 10 mg via INTRAVENOUS
  Filled 2023-08-29: qty 10

## 2023-08-29 MED ORDER — SODIUM CHLORIDE 0.9 % IV SOLN: Freq: Once | INTRAVENOUS | Status: AC

## 2023-08-29 MED ORDER — FLUOROURACIL CHEMO INJECTION 2.5 GM/50ML
400.0000 mg/m2 | Freq: Once | INTRAVENOUS | Status: AC
  Administered 2023-08-29: 750 mg via INTRAVENOUS
  Filled 2023-08-29: qty 15

## 2023-08-29 MED ORDER — SODIUM CHLORIDE 0.9 % IV SOLN
180.0000 mg/m2 | Freq: Once | INTRAVENOUS | Status: AC
  Administered 2023-08-29: 340 mg via INTRAVENOUS
  Filled 2023-08-29: qty 15

## 2023-08-29 MED ORDER — PALONOSETRON HCL INJECTION 0.25 MG/5ML
0.2500 mg | Freq: Once | INTRAVENOUS | Status: AC
  Administered 2023-08-29: 0.25 mg via INTRAVENOUS
  Filled 2023-08-29: qty 5

## 2023-08-29 NOTE — Progress Notes (Signed)
Patient presents today for Folfiri infusion. Patient is in satisfactory condition with no new complaints voiced.  Vital signs are stable.  Labs reviewed and all labs are within treatment parameters.  We will proceed with treatment per MD orders.    Treatment given today per MD orders. Tolerated infusion without adverse affects. Vital signs stable. No complaints at this time. Discharged from clinic ambulatory in stable condition. Alert and oriented x 3. F/U with [facility] as scheduled. 5FU ambulatory pump infusing.

## 2023-08-29 NOTE — Patient Instructions (Signed)
[facility]  Discharge Instructions: Thank you for choosing [facility] to provide your oncology and hematology care.  If you have a lab appointment with the Cancer Center - please note that after April 8th, 2024, all labs will be drawn in the cancer center.  You do not have to check in or register with the main entrance as you have in the past but will complete your check-in in the cancer center.  Wear comfortable clothing and clothing appropriate for easy access to any Portacath or PICC line.   We strive to give you quality time with your provider. You may need to reschedule your appointment if you arrive late (15 or more minutes).  Arriving late affects you and other patients whose appointments are after yours.  Also, if you miss three or more appointments without notifying the office, you may be dismissed from the clinic at the provider's discretion.      For prescription refill requests, have your pharmacy contact our office and allow 72 hours for refills to be completed.    Today you received the following chemotherapy and/or immunotherapy agents Folfiri   To help prevent nausea and vomiting after your treatment, we encourage you to take your nausea medication as directed.    BELOW ARE SYMPTOMS THAT SHOULD BE REPORTED IMMEDIATELY: *FEVER GREATER THAN 100.4 F (38 C) OR HIGHER *CHILLS OR SWEATING *NAUSEA AND VOMITING THAT IS NOT CONTROLLED WITH YOUR NAUSEA MEDICATION *UNUSUAL SHORTNESS OF BREATH *UNUSUAL BRUISING OR BLEEDING *URINARY PROBLEMS (pain or burning when urinating, or frequent urination) *BOWEL PROBLEMS (unusual diarrhea, constipation, pain near the anus) TENDERNESS IN MOUTH AND THROAT WITH OR WITHOUT PRESENCE OF ULCERS (sore throat, sores in mouth, or a toothache) UNUSUAL RASH, SWELLING OR PAIN  UNUSUAL VAGINAL DISCHARGE OR ITCHING   Items with * indicate a potential emergency and should be followed up as soon as possible or go to the  Emergency Department if any problems should occur.  Please show the CHEMOTHERAPY ALERT CARD or IMMUNOTHERAPY ALERT CARD at check-in to the Emergency Department and triage nurse.  Should you have questions after your visit or need to cancel or reschedule your appointment, please contact [facility] CENTER AT [facility] [phone]  and follow the prompts.  Office hours are 8:00 a.m. to 4:30 p.m. Monday - Friday. Please note that voicemails left after 4:00 p.m. may not be returned until the following business day.  We are closed weekends and major holidays. You have access to a nurse at all times for urgent questions. Please call the main number to the clinic [phone] and follow the prompts.  For any non-urgent questions, you may also contact your provider using MyChart. We now offer e-Visits for anyone 63 and older to request care online for non-urgent symptoms. For details visit [email or web address].   Also download the MyChart app! Go to the app store, search "MyChart", open the app, select [place], and log in with your MyChart username and password.

## 2023-08-30 LAB — CEA: CEA: 9 ng/mL — ABNORMAL HIGH (ref 0.0–4.7)

## 2023-08-31 ENCOUNTER — Inpatient Hospital Stay: Payer: BC Managed Care – PPO

## 2023-08-31 VITALS — BP 120/58 | HR 87 | Temp 97.8°F | Resp 18

## 2023-08-31 DIAGNOSIS — C20 Malignant neoplasm of rectum: Secondary | ICD-10-CM

## 2023-08-31 DIAGNOSIS — Z95828 Presence of other vascular implants and grafts: Secondary | ICD-10-CM

## 2023-08-31 DIAGNOSIS — Z5111 Encounter for antineoplastic chemotherapy: Secondary | ICD-10-CM | POA: Diagnosis not present

## 2023-08-31 MED ORDER — SODIUM CHLORIDE 0.9% FLUSH
10.0000 mL | INTRAVENOUS | Status: DC | PRN
  Administered 2023-08-31: 10 mL

## 2023-08-31 MED ORDER — HEPARIN SOD (PORK) LOCK FLUSH 100 UNIT/ML IV SOLN
500.0000 [IU] | Freq: Once | INTRAVENOUS | Status: AC | PRN
  Administered 2023-08-31: 500 [IU]

## 2023-08-31 NOTE — Patient Instructions (Signed)
[facility]  Discharge Instructions: Thank you for choosing [facility] to provide your oncology and hematology care.  If you have a lab appointment with the Cancer Center - please note that after April 8th, 2024, all labs will be drawn in the cancer center.  You do not have to check in or register with the main entrance as you have in the past but will complete your check-in in the cancer center.  Wear comfortable clothing and clothing appropriate for easy access to any Portacath or PICC line.   We strive to give you quality time with your provider. You may need to reschedule your appointment if you arrive late (15 or more minutes).  Arriving late affects you and other patients whose appointments are after yours.  Also, if you miss three or more appointments without notifying the office, you may be dismissed from the clinic at the provider's discretion.      For prescription refill requests, have your pharmacy contact our office and allow 72 hours for refills to be completed.    Today you received the following chemotherapy and/or immunotherapy agents pump stop      To help prevent nausea and vomiting after your treatment, we encourage you to take your nausea medication as directed.  BELOW ARE SYMPTOMS THAT SHOULD BE REPORTED IMMEDIATELY: *FEVER GREATER THAN 100.4 F (38 C) OR HIGHER *CHILLS OR SWEATING *NAUSEA AND VOMITING THAT IS NOT CONTROLLED WITH YOUR NAUSEA MEDICATION *UNUSUAL SHORTNESS OF BREATH *UNUSUAL BRUISING OR BLEEDING *URINARY PROBLEMS (pain or burning when urinating, or frequent urination) *BOWEL PROBLEMS (unusual diarrhea, constipation, pain near the anus) TENDERNESS IN MOUTH AND THROAT WITH OR WITHOUT PRESENCE OF ULCERS (sore throat, sores in mouth, or a toothache) UNUSUAL RASH, SWELLING OR PAIN  UNUSUAL VAGINAL DISCHARGE OR ITCHING   Items with * indicate a potential emergency and should be followed up as soon as possible or go to the  Emergency Department if any problems should occur.  Please show the CHEMOTHERAPY ALERT CARD or IMMUNOTHERAPY ALERT CARD at check-in to the Emergency Department and triage nurse.  Should you have questions after your visit or need to cancel or reschedule your appointment, please contact [facility] CENTER AT [facility] [phone]  and follow the prompts.  Office hours are 8:00 a.m. to 4:30 p.m. Monday - Friday. Please note that voicemails left after 4:00 p.m. may not be returned until the following business day.  We are closed weekends and major holidays. You have access to a nurse at all times for urgent questions. Please call the main number to the clinic [phone] and follow the prompts.  For any non-urgent questions, you may also contact your provider using MyChart. We now offer e-Visits for anyone 55 and older to request care online for non-urgent symptoms. For details visit [email or web address].   Also download the MyChart app! Go to the app store, search "MyChart", open the app, select [place], and log in with your MyChart username and password.

## 2023-08-31 NOTE — Progress Notes (Signed)
Patient presents today for 5FU pump stop and disconnection after 46 hour continous infusion.   5FU pump deaccessed.  Patients port flushed without difficulty.  Good blood return noted with no bruising or swelling noted at site.  Needle removed intact.  Band aid applied.  VSS with discharge and left in satisfactory condition via wheelchair with no s/s of distress noted.    

## 2023-09-10 ENCOUNTER — Inpatient Hospital Stay: Payer: BC Managed Care – PPO

## 2023-09-10 ENCOUNTER — Inpatient Hospital Stay: Payer: BC Managed Care – PPO | Attending: Hematology | Admitting: Hematology

## 2023-09-10 VITALS — BP 107/63 | HR 71 | Temp 97.3°F | Resp 18

## 2023-09-10 DIAGNOSIS — R197 Diarrhea, unspecified: Secondary | ICD-10-CM | POA: Insufficient documentation

## 2023-09-10 DIAGNOSIS — F17210 Nicotine dependence, cigarettes, uncomplicated: Secondary | ICD-10-CM | POA: Diagnosis not present

## 2023-09-10 DIAGNOSIS — Z5111 Encounter for antineoplastic chemotherapy: Secondary | ICD-10-CM | POA: Insufficient documentation

## 2023-09-10 DIAGNOSIS — Z95828 Presence of other vascular implants and grafts: Secondary | ICD-10-CM

## 2023-09-10 DIAGNOSIS — G629 Polyneuropathy, unspecified: Secondary | ICD-10-CM | POA: Insufficient documentation

## 2023-09-10 DIAGNOSIS — C787 Secondary malignant neoplasm of liver and intrahepatic bile duct: Secondary | ICD-10-CM | POA: Insufficient documentation

## 2023-09-10 DIAGNOSIS — C20 Malignant neoplasm of rectum: Secondary | ICD-10-CM

## 2023-09-10 DIAGNOSIS — C19 Malignant neoplasm of rectosigmoid junction: Secondary | ICD-10-CM | POA: Diagnosis not present

## 2023-09-10 LAB — URINALYSIS, DIPSTICK ONLY
Bilirubin Urine: NEGATIVE
Glucose, UA: NEGATIVE mg/dL
Hgb urine dipstick: NEGATIVE
Ketones, ur: NEGATIVE mg/dL
Nitrite: NEGATIVE
Protein, ur: 30 mg/dL — AB
Specific Gravity, Urine: 1.032 — ABNORMAL HIGH (ref 1.005–1.030)
pH: 5 (ref 5.0–8.0)

## 2023-09-10 LAB — COMPREHENSIVE METABOLIC PANEL
ALT: 21 U/L (ref 0–44)
AST: 17 U/L (ref 15–41)
Albumin: 3.6 g/dL (ref 3.5–5.0)
Alkaline Phosphatase: 82 U/L (ref 38–126)
Anion gap: 8 (ref 5–15)
BUN: 8 mg/dL (ref 6–20)
CO2: 24 mmol/L (ref 22–32)
Calcium: 9.2 mg/dL (ref 8.9–10.3)
Chloride: 106 mmol/L (ref 98–111)
Creatinine, Ser: 0.66 mg/dL (ref 0.44–1.00)
GFR, Estimated: >60 mL/min (ref >60)
Glucose, Bld: 106 mg/dL — ABNORMAL HIGH (ref 70–99)
Potassium: 3.8 mmol/L (ref 3.5–5.1)
Sodium: 138 mmol/L (ref 135–145)
Total Bilirubin: 0.1 mg/dL — ABNORMAL LOW (ref 0.3–1.2)
Total Protein: 6.8 g/dL (ref 6.5–8.1)

## 2023-09-10 LAB — CBC WITH DIFFERENTIAL/PLATELET
Abs Immature Granulocytes: 0.02 10*3/uL (ref 0.00–0.07)
Basophils Absolute: 0 10*3/uL (ref 0.0–0.1)
Basophils Relative: 0 %
Eosinophils Absolute: 0.2 10*3/uL (ref 0.0–0.5)
Eosinophils Relative: 4 %
HCT: 39.6 % (ref 36.0–46.0)
Hemoglobin: 13.2 g/dL (ref 12.0–15.0)
Immature Granulocytes: 0 %
Lymphocytes Relative: 16 %
Lymphs Abs: 0.8 10*3/uL (ref 0.7–4.0)
MCH: 32 pg (ref 26.0–34.0)
MCHC: 33.3 g/dL (ref 30.0–36.0)
MCV: 96.1 fL (ref 80.0–100.0)
Monocytes Absolute: 0.3 10*3/uL (ref 0.1–1.0)
Monocytes Relative: 6 %
Neutro Abs: 3.5 10*3/uL (ref 1.7–7.7)
Neutrophils Relative %: 74 %
Platelets: 217 10*3/uL (ref 150–400)
RBC: 4.12 MIL/uL (ref 3.87–5.11)
RDW: 14.2 % (ref 11.5–15.5)
WBC: 4.8 10*3/uL (ref 4.0–10.5)
nRBC: 0 % (ref 0.0–0.2)

## 2023-09-10 LAB — MAGNESIUM: Magnesium: 2.1 mg/dL (ref 1.7–2.4)

## 2023-09-10 MED ORDER — SODIUM CHLORIDE 0.9 % IV SOLN: Freq: Once | INTRAVENOUS | Status: AC

## 2023-09-10 MED ORDER — SODIUM CHLORIDE 0.9 % IV SOLN
180.0000 mg/m2 | Freq: Once | INTRAVENOUS | Status: AC
  Administered 2023-09-10: 340 mg via INTRAVENOUS
  Filled 2023-09-10: qty 17

## 2023-09-10 MED ORDER — ATROPINE SULFATE 1 MG/ML IV SOLN
0.5000 mg | Freq: Once | INTRAVENOUS | Status: AC | PRN
  Administered 2023-09-10: 0.5 mg via INTRAVENOUS
  Filled 2023-09-10: qty 1

## 2023-09-10 MED ORDER — SODIUM CHLORIDE 0.9 % IV SOLN
10.0000 mg | Freq: Once | INTRAVENOUS | Status: AC
  Administered 2023-09-10: 10 mg via INTRAVENOUS
  Filled 2023-09-10: qty 10

## 2023-09-10 MED ORDER — SODIUM CHLORIDE 0.9 % IV SOLN
2400.0000 mg/m2 | INTRAVENOUS | Status: DC
  Administered 2023-09-10: 4500 mg via INTRAVENOUS
  Filled 2023-09-10: qty 90

## 2023-09-10 MED ORDER — FLUOROURACIL CHEMO INJECTION 2.5 GM/50ML
400.0000 mg/m2 | Freq: Once | INTRAVENOUS | Status: AC
  Administered 2023-09-10: 750 mg via INTRAVENOUS
  Filled 2023-09-10: qty 15

## 2023-09-10 MED ORDER — HEPARIN SOD (PORK) LOCK FLUSH 100 UNIT/ML IV SOLN: 500.0000 [IU] | Freq: Once | INTRAVENOUS | Status: DC | PRN

## 2023-09-10 MED ORDER — SODIUM CHLORIDE 0.9 % IV SOLN
400.0000 mg/m2 | Freq: Once | INTRAVENOUS | Status: AC
  Administered 2023-09-10: 748 mg via INTRAVENOUS
  Filled 2023-09-10: qty 37.4

## 2023-09-10 MED ORDER — SODIUM CHLORIDE 0.9% FLUSH
10.0000 mL | Freq: Once | INTRAVENOUS | Status: AC
  Administered 2023-09-10: 10 mL

## 2023-09-10 MED ORDER — SODIUM CHLORIDE 0.9% FLUSH: 10.0000 mL | INTRAVENOUS | Status: DC | PRN

## 2023-09-10 MED ORDER — PALONOSETRON HCL INJECTION 0.25 MG/5ML
0.2500 mg | Freq: Once | INTRAVENOUS | Status: AC
  Administered 2023-09-10: 0.25 mg via INTRAVENOUS
  Filled 2023-09-10: qty 5

## 2023-09-10 NOTE — Progress Notes (Signed)
Patients port flushed without difficulty.  Good blood return noted with no bruising or swelling noted at site.  Patient remains accessed for treatment.  

## 2023-09-10 NOTE — Progress Notes (Signed)
Patient presents today for avastin, FOLFIRI with 5FU pump start.  Vital signs and labs reviewed by MD.  Message received from Chapman Moss RN/Dr. Ellin Saba patient okay for treatment, continue to hold Avastin.   Stable during infusion without adverse affects.  Vital signs stable.  No complaints at this time.  5FU pump start and verified RUN on the screen with the patient.  Discharge from clinic ambulatory in stable condition.  Alert and oriented X 3.  Follow up with [facility] as scheduled.

## 2023-09-10 NOTE — Progress Notes (Signed)
[facility] [address], [place] [address]    Clinic Day:  09/10/2023  Referring physician: Junie Spencer, FNP  Patient Care Team: Terri Spencer, FNP as PCP - General (Family Medicine) Terri Massed, MD as Medical Oncologist (Medical Oncology) Terri Sarah, RN as Oncology Nurse Navigator (Medical Oncology)   ASSESSMENT & PLAN:   Assessment: 1. 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  Stools have become bloody/mucousy lately. - She also reported pain in the tailbone region since March 2023.  She also has right-sided lower back pain. - 50 pound weight loss in the last 9 months, part of weight loss was intentional.  She cut back on eating sweets and lost taste to sweets after COVID infection. - CT [facility] with contrast on 03/08/2022: Irregular circumferential masslike wall thickening of sigmoid colon/rectum with adjacent perirectal adenopathy.  Multiple small hypodense lesions in the liver, largest 2.9 cm in the central aspect of the liver, question metastatic disease. - Colonoscopy on 03/21/2022 by Dr. Marletta Mckinney: Fungating infiltrative nearly completely obstructing mass in the rectosigmoid colon, mass was circumferential measuring 4 cm in length. - Pathology: Rectal mass biopsy consistent with invasive moderately differentiated adenocarcinoma.  As there is very scant invasive tumor, MSI studies were deferred. - PET scan (03/23/2022): Hypermetabolic rectal primary long-segment with SUV 14.3.  Left posterior perirectal lymph node 7 mm with SUV 2.9.  Multiple tiny foci of hepatic hypermetabolism. - MRI of the liver (04/01/2022): Multiple small hypovascular rim-enhancing liver lesions, predominantly in the right hepatic lobe measuring up to 1.1 cm.  3.3 cm hypervascular mass in the central liver, most consistent with FNH/hepatic adenoma. - Liver biopsy (04/20/2022): Metastatic moderately  differentiated colonic adenocarcinoma with mucinous features - NGS testing shows K-ras G12 R mutation, PIK3CA exon 21 mutation.  MS-stable.  TMB-low. - Cycle 1 of FOLFOXIRI on 05/29/2022, bevacizumab added during cycle 2, cycle 13 on 11/15/2022 -MRI pelvis (10/23/2022): Known metastatic rectal cancer with persistent rectal narrowing and signs of involvement of posterior cervical stroma and anterior peritoneal reflection.  There is evidence of residual tumor in the posterior cervix.  Persistent presacral edema not substantially changed. - MRI liver (11/10/2022): 3 benign lesions and previous small rim-enhancing metastatic lesions in the liver are no longer seen. - Chemoradiation therapy with Xeloda from 12/11/2022 through 01/18/2023. - 04/04/2023: Robotic assisted LAR, diverting ileostomy, TAH with BSO - Pathology: YpT4b, ypN0, YPM1, 0/12 lymph nodes involved, no tumor deposits identified, margins negative, LVI negative, PNI positive, 1.5 cm grade 2 adenocarcinoma in the rectosigmoid colon, no perforation, MMR preserved.   2. Social/family history: - She is separated and is seen today with her daughter.  She works as a Pensions consultant at [employer].  She is current active smoker, 1 pack/day for 27 years.  Denies drinking alcohol. - Paternal aunt had colon cancer.  Maternal cousin has breast cancer.  Maternal uncle had leukemia and maternal cousin had leukemia.    Plan: Metastatic rectal cancer to the liver: - She has completed 4 cycles of FOLFIRI.  Bevacizumab was held for the last 3 cycles. - She will see Dr. Maisie Fus today to discuss surgery plan. - Reviewed labs today: Normal LFTs and creatinine.  CBC grossly normal today.  UA shows protein 30.  CEA has improved to 9 from 31. - She is tolerating FOLFIRI regimen very well. - Proceed with cycle 5 today and cycle 6 in 2  weeks.  RTC 4 weeks for follow-up.  Will plan on repeating CT CAP with and CEA level  2.  Diarrhea: - Continue to use Imodium as needed.   Ileostomy output is stable.  3.  Peripheral neuropathy: - Tingling and numbness from ankles to toes is stable.  No numbness in the hands.    Orders Placed This Encounter  Procedures   CT CHEST ABDOMEN PELVIS W CONTRAST    Standing Status:   Future    Standing Expiration Date:   09/09/2024    Order Specific Question:   If indicated for the ordered procedure, I authorize the administration of contrast media per Radiology protocol    Answer:   Yes    Order Specific Question:   Does the patient have a contrast media/X-ray dye allergy?    Answer:   No    Order Specific Question:   Preferred imaging location?    Answer:   [facility]    Order Specific Question:   If indicated for the ordered procedure, I authorize the administration of oral contrast media per Radiology protocol    Answer:   Yes      I,Katie Daubenspeck,acting as a scribe for Terri Massed, MD.,have documented all relevant documentation on the behalf of Terri Massed, MD,as directed by  Terri Massed, MD while in the presence of Terri Massed, MD.   I, Terri Massed MD, have reviewed the above documentation for accuracy and completeness, and I agree with the above.   Terri Massed, MD   10/7/20245:49 PM  CHIEF COMPLAINT:   Diagnosis: metastatic rectal cancer to the liver    Cancer Staging  Rectal cancer [facility]) Staging form: Colon and Rectum, AJCC 8th Edition - Clinical stage from 03/28/2022: Stage IVA (cTX, cN1, cM1a) - Unsigned    Prior Therapy: 1. FOLFOXIRI and bevacizumab, 13 cycles, 05/29/22 - 11/15/22  2. Xeloda with radiation therapy, 12/11/22 - 01/18/23  Current Therapy:  FOLFIRI    HISTORY OF PRESENT ILLNESS:   Oncology History  Rectal cancer (HCC)  03/28/2022 Initial Diagnosis   Rectal cancer (HCC)   05/29/2022 - 07/26/2022 Chemotherapy   Patient is on Treatment Plan : COLORECTAL FOLFOXIRI + Bevacizumab q14d     05/29/2022 - 11/17/2022 Chemotherapy    Patient is on Treatment Plan : COLORECTAL FOLFOXIRI + Bevacizumab q14d      Genetic Testing   No pathogenic variants identified on the [facility] CustomNext+RNA panel. The report date is 09/08/2022.  The CustomNext-Cancer+RNAinsight panel offered by [employer] includes sequencing and rearrangement analysis for the following 47 genes:  APC, ATM, AXIN2, BARD1, BMPR1A, BRCA1, BRCA2, BRIP1, CDH1, CDK4, CDKN2A, CHEK2, DICER1, EPCAM, GREM1, HOXB13, MEN1, MLH1, MSH2, MSH3, MSH6, MUTYH, NBN, NF1, NF2, NTHL1, PALB2, PMS2, POLD1, POLE, PTEN, RAD51C, RAD51D, RECQL, RET, SDHA, SDHAF2, SDHB, SDHC, SDHD, SMAD4, SMARCA4, STK11, TP53, TSC1, TSC2, and VHL.  RNA data is routinely analyzed for use in variant interpretation for all genes.   07/16/2023 -  Chemotherapy   Patient is on Treatment Plan : COLORECTAL FOLFIRI + Bevacizumab q14d        INTERVAL HISTORY:   Laiyla is a 42 y.o. female presenting to clinic today for follow up of metastatic rectal cancer to the liver. She was last seen by me on 07/30/23.  Today, she states that she is doing well overall. Her appetite level is at 100%. Her energy level is at 70 3%.  PAST MEDICAL HISTORY:   Past Medical History: Past Medical History:  Diagnosis Date  Cancer [facility])    Neuromuscular disorder (HCC)    neuropathy in hands and feet from chemo   Port-A-Cath in place 05/24/2022   upper Rt chest   Tobacco abuse     Surgical History: Past Surgical History:  Procedure Laterality Date   BIOPSY  03/21/2022   Procedure: BIOPSY;  Surgeon: Lanelle Bal, DO;  [facility];  Service: Endoscopy;;   COLONOSCOPY WITH PROPOFOL N/A 03/21/2022   Procedure: COLONOSCOPY WITH PROPOFOL;  Surgeon: Lanelle Bal, DO;  Location: [facility];  Service: Endoscopy;  Laterality: N/A;  8:30am   FLEXIBLE SIGMOIDOSCOPY N/A 05/19/2022   Procedure: FLEXIBLE SIGMOIDOSCOPY WITH TATTOO INJECTION;  Surgeon: Romie Levee, MD;  Location: [facility] ORS;  Service: General;   Laterality: N/A;   ILEOSTOMY N/A 04/04/2023   Procedure: DIVERTING ILEOSTOMY;  Surgeon: Romie Levee, MD;  Location: [facility];  Service: General;  Laterality: N/A;   LAPAROSCOPIC DIVERTED COLOSTOMY N/A 05/19/2022   Procedure: LAPAROSCOPIC DIVERTED OSTOMY;  Surgeon: Romie Levee, MD;  Location: [facility];  Service: General;  Laterality: N/A;   PORTACATH PLACEMENT Right 05/19/2022   Procedure: INSERTION PORT-A-CATH;  Surgeon: Romie Levee, MD;  Location: [facility] OR[facility];  Service: General;  Laterality: Right;   ROBOTIC ASSISTED TOTAL HYSTERECTOMY WITH BILATERAL SALPINGO OOPHERECTOMY N/A 04/04/2023   Procedure: XI ROBOTIC ASSISTED TOTAL HYSTERECTOMY WITH BILATERAL SALPINGO OOPHORECTOMY;  Surgeon: Carver Fila, MD;  Location: [facility] OR[facility];  Service: Gynecology;  Laterality: N/A;   XI ROBOTIC ASSISTED LOWER ANTERIOR RESECTION N/A 04/04/2023   Procedure: XI ROBOTIC ASSISTED LOWER ANTERIOR RESECTION;  Surgeon: Romie Levee, MD;  Location: [facility];  Service: General;  Laterality: N/A;    Social History: Social History   Socioeconomic History   Marital status: Married    Spouse name: Not on file   Number of children: Not on file   Years of education: Not on file   Highest education level: Not on file  Occupational History   Not on file  Tobacco Use   Smoking status: Every Day    Current packs/day: 0.25    Average packs/day: 0.3 packs/day for 27.0 years (6.8 ttl pk-yrs)    Types: Cigarettes   Smokeless tobacco: Never  Vaping Use   Vaping status: Never Used  Substance and Sexual Activity   Alcohol use: No   Drug use: No   Sexual activity: Yes    Birth control/protection: Injection  Other Topics Concern   Not on file  Social History Narrative   Not on file   Social Determinants of Health   Financial Resource Strain: Not on file  Food Insecurity: No Food Insecurity (04/04/2023)   Hunger Vital Sign    Worried About Running Out of Food in the Last Year: Never true    Ran Out of Food in the Last  Year: Never true  Transportation Needs: No Transportation Needs (04/04/2023)   PRAPARE - Administrator, Civil Service (Medical): No    Lack of Transportation (Non-Medical): No  Physical Activity: Not on file  Stress: Not on file  Social Connections: Not on file  Intimate Partner Violence: Not At Risk (04/04/2023)   Humiliation, Afraid, Rape, and Kick questionnaire    Fear of Current or Ex-Partner: No    Emotionally Abused: No    Physically Abused: No    Sexually Abused: No    Family History: Family History  Problem Relation Age of Onset   Heart disease Mother    Diabetes Mother  Hearing loss Father    Heart disease Father    Hyperlipidemia Father    Hypertension Father    Stroke Father    Arthritis Father    Diabetes Father    Learning disabilities Brother    Diabetes Maternal Grandmother    Heart disease Maternal Grandmother    Diabetes Maternal Grandfather    Diabetes Paternal Grandmother    Diabetes Paternal Grandfather    Leukemia Maternal Uncle        d. 56s   Colon cancer Paternal Aunt        dx 43s   Breast cancer Cousin 33   Lung cancer Maternal Aunt     Current Medications:  Current Outpatient Medications:    apixaban (ELIQUIS) 2.5 MG TABS tablet, Take 1 tablet (2.5 mg total) by mouth 2 (two) times daily., Disp: 60 tablet, Rfl: 0   estradiol (CLIMARA) 0.1 mg/24hr patch, Place 1 patch (0.1 mg total) onto the skin once a week. Start one week after surgery, Disp: 4 patch, Rfl: 6   estradiol (ESTRACE VAGINAL) 0.1 MG/GM vaginal cream, 1 week after surgery, begin to use cream nightly at bedtime for 2 weeks then 3 times a week after to assist with healing vagina. Use a fingertip size amount of cream on finger and place slightly past vaginal entrance. Do not use the applicator that comes with cream., Disp: 42.5 g, Rfl: 3   loperamide (IMODIUM) 2 MG capsule, Take 1 capsule (2 mg total) by mouth 2 (two) times daily., Disp: 60 capsule, Rfl: 1   Multiple  Vitamin (MULTIVITAMIN) tablet, Take 1 tablet by mouth daily., Disp: , Rfl:    oxyCODONE (OXY IR/ROXICODONE) 5 MG immediate release tablet, Take 1 tablet (5 mg total) by mouth every 6 (six) hours as needed for severe pain., Disp: 20 tablet, Rfl: 0   vitamin B-12 (CYANOCOBALAMIN) 250 MCG tablet, Take 250 mcg by mouth daily., Disp: , Rfl:   Current Facility-Administered Medications:    medroxyPROGESTERone (DEPO-PROVERA) injection 150 mg, 150 mg, Intramuscular, Q90 days, Hawks, Christy A, FNP, 150 mg at 09/21/22 1619  Facility-Administered Medications Ordered in Other Visits:    fluorouracil (ADRUCIL) 4,500 mg in sodium chloride 0.9 % 60 mL chemo infusion, 2,400 mg/m2 (Treatment Plan Recorded), Intravenous, 1 day or 1 dose, Terri Massed, MD, Infusion Verify at 09/10/23 1459   heparin lock flush 100 unit/mL, 500 Units, Intracatheter, Once PRN, Terri Massed, MD   sodium chloride flush (NS) 0.9 % injection 10 mL, 10 mL, Intracatheter, PRN, Terri Massed, MD   Allergies: Allergies  Allergen Reactions   Wellbutrin [Bupropion]     insomnia   Tramadol Rash    Mainly in chest and neck area    REVIEW OF SYSTEMS:   Review of Systems  Constitutional:  Negative for chills, fatigue and fever.  HENT:   Negative for lump/mass, mouth sores, nosebleeds, sore throat and trouble swallowing.   Eyes:  Negative for eye problems.  Respiratory:  Positive for cough. Negative for shortness of breath.   Cardiovascular:  Negative for chest pain, leg swelling and palpitations.  Gastrointestinal:  Negative for abdominal pain, constipation, diarrhea, nausea and vomiting.  Genitourinary:  Negative for bladder incontinence, difficulty urinating, dysuria, frequency, hematuria and nocturia.   Musculoskeletal:  Negative for arthralgias, back pain, flank pain, myalgias and neck pain.  Skin:  Negative for itching and rash.  Neurological:  Positive for numbness. Negative for dizziness and headaches.   Hematological:  Does not bruise/bleed easily.  Psychiatric/Behavioral:  Negative  for depression, sleep disturbance and suicidal ideas. The patient is not nervous/anxious.   All other systems reviewed and are negative.    VITALS:   Last menstrual period 04/19/2022.  Wt Readings from Last 3 Encounters:  09/10/23 156 lb 6.4 oz (70.9 kg)  08/29/23 155 lb 11.2 oz (70.6 kg)  08/13/23 160 lb 3.2 oz (72.7 kg)    There is no height or weight on file to calculate BMI.  Performance status (ECOG): 0 - Asymptomatic  PHYSICAL EXAM:   Physical Exam Vitals and nursing note reviewed. Exam conducted with a chaperone present.  Constitutional:      Appearance: Normal appearance.  Cardiovascular:     Rate and Rhythm: Normal rate and regular rhythm.     Pulses: Normal pulses.     Heart sounds: Normal heart sounds.  Pulmonary:     Effort: Pulmonary effort is normal.     Breath sounds: Normal breath sounds.  Abdominal:     Palpations: Abdomen is soft. There is no hepatomegaly, splenomegaly or mass.     Tenderness: There is no abdominal tenderness.  Musculoskeletal:     Right lower leg: No edema.     Left lower leg: No edema.  Lymphadenopathy:     Cervical: No cervical adenopathy.     Right cervical: No superficial, deep or posterior cervical adenopathy.    Left cervical: No superficial, deep or posterior cervical adenopathy.     Upper Body:     Right upper body: No supraclavicular or axillary adenopathy.     Left upper body: No supraclavicular or axillary adenopathy.  Neurological:     General: No focal deficit present.     Mental Status: She is alert and oriented to person, place, and time.  Psychiatric:        Mood and Affect: Mood normal.        Behavior: Behavior normal.     LABS:      Latest Ref Rng & Units 09/10/2023    9:22 AM 08/29/2023    7:50 AM 08/13/2023    9:45 AM  CBC  WBC 4.0 - 10.5 K/uL 4.8  4.2  4.2   Hemoglobin 12.0 - 15.0 g/dL 86.5  78.4  69.6   Hematocrit 36.0  - 46.0 % 39.6  40.6  40.3   Platelets 150 - 400 K/uL 217  251  208       Latest Ref Rng & Units 09/10/2023    9:22 AM 08/29/2023    7:50 AM 08/13/2023    9:45 AM  CMP  Glucose 70 - 99 mg/dL 295  284  98   BUN 6 - 20 mg/dL 8  10  9    Creatinine 0.44 - 1.00 mg/dL 1.32  4.40  1.02   Sodium 135 - 145 mmol/L 138  136  137   Potassium 3.5 - 5.1 mmol/L 3.8  4.0  3.6   Chloride 98 - 111 mmol/L 106  105  105   CO2 22 - 32 mmol/L 24  24  23    Calcium 8.9 - 10.3 mg/dL 9.2  9.3  8.9   Total Protein 6.5 - 8.1 g/dL 6.8  6.6  6.5   Total Bilirubin 0.3 - 1.2 mg/dL 0.1  0.7  0.4   Alkaline Phos 38 - 126 U/L 82  89  70   AST 15 - 41 U/L 17  18  16    ALT 0 - 44 U/L 21  29  20  Lab Results  Component Value Date   CEA1 9.0 (H) 08/29/2023   /  CEA  Date Value Ref Range Status  08/29/2023 9.0 (H) 0.0 - 4.7 ng/mL Final    Comment:    (NOTE)                             Nonsmokers          <3.9                             Smokers             <5.6 Roche [employer] Electrochemiluminescence Immunoassay (ECLIA) Values obtained with different assay methods or kits cannot be used interchangeably.  Results cannot be interpreted as absolute evidence of the presence or absence of malignant disease. Performed At: [facility] [address] [place], [place] [id number] Jolene Schimke MD QM:[id number]    No results found for: "PSA1" No results found for: "3651151491" No results found for: "CAN125"  No results found for: "TOTALPROTELP", "ALBUMINELP", "A1GS", "A2GS", "BETS", "BETA2SER", "GAMS", "MSPIKE", "SPEI" No results found for: "TIBC", "FERRITIN", "IRONPCTSAT" No results found for: "LDH"   STUDIES:   No results found.

## 2023-09-10 NOTE — Patient Instructions (Signed)
[facility]  Discharge Instructions: Thank you for choosing [facility] to provide your oncology and hematology care.  If you have a lab appointment with the Cancer Center - please note that after April 8th, 2024, all labs will be drawn in the cancer center.  You do not have to check in or register with the main entrance as you have in the past but will complete your check-in in the cancer center.  Wear comfortable clothing and clothing appropriate for easy access to any Portacath or PICC line.   We strive to give you quality time with your provider. You may need to reschedule your appointment if you arrive late (15 or more minutes).  Arriving late affects you and other patients whose appointments are after yours.  Also, if you miss three or more appointments without notifying the office, you may be dismissed from the clinic at the provider's discretion.      For prescription refill requests, have your pharmacy contact our office and allow 72 hours for refills to be completed.    Today you received the following chemotherapy and/or immunotherapy agents FOLFIRI with 5FU pump start      To help prevent nausea and vomiting after your treatment, we encourage you to take your nausea medication as directed.  BELOW ARE SYMPTOMS THAT SHOULD BE REPORTED IMMEDIATELY: *FEVER GREATER THAN 100.4 F (38 C) OR HIGHER *CHILLS OR SWEATING *NAUSEA AND VOMITING THAT IS NOT CONTROLLED WITH YOUR NAUSEA MEDICATION *UNUSUAL SHORTNESS OF BREATH *UNUSUAL BRUISING OR BLEEDING *URINARY PROBLEMS (pain or burning when urinating, or frequent urination) *BOWEL PROBLEMS (unusual diarrhea, constipation, pain near the anus) TENDERNESS IN MOUTH AND THROAT WITH OR WITHOUT PRESENCE OF ULCERS (sore throat, sores in mouth, or a toothache) UNUSUAL RASH, SWELLING OR PAIN  UNUSUAL VAGINAL DISCHARGE OR ITCHING   Items with * indicate a potential emergency and should be followed up as soon as  possible or go to the Emergency Department if any problems should occur.  Please show the CHEMOTHERAPY ALERT CARD or IMMUNOTHERAPY ALERT CARD at check-in to the Emergency Department and triage nurse.  Should you have questions after your visit or need to cancel or reschedule your appointment, please contact [facility] [phone]  and follow the prompts.  Office hours are 8:00 a.m. to 4:30 p.m. Monday - Friday. Please note that voicemails left after 4:00 p.m. may not be returned until the following business day.  We are closed weekends and major holidays. You have access to a nurse at all times for urgent questions. Please call the main number to the clinic [phone] and follow the prompts.  For any non-urgent questions, you may also contact your provider using MyChart. We now offer e-Visits for anyone 18 and older to request care online for non-urgent symptoms. For details visit [email or web address].   Also download the MyChart app! Go to the app store, search "MyChart", open the app, select [place], and log in with your MyChart username and password.

## 2023-09-10 NOTE — Patient Instructions (Signed)

## 2023-09-10 NOTE — Progress Notes (Signed)
Patient has been examined by Dr. Delton Coombes. Vital signs and labs have been reviewed by MD - ANC, Creatinine, LFTs, hemoglobin, and platelets are within treatment parameters per M.D. - pt may proceed with treatment.  Continue to hold Avastin per MD. Primary RN and pharmacy notified.

## 2023-09-11 ENCOUNTER — Other Ambulatory Visit: Payer: Self-pay

## 2023-09-12 ENCOUNTER — Inpatient Hospital Stay: Payer: BC Managed Care – PPO

## 2023-09-12 VITALS — BP 111/63 | HR 74 | Temp 97.3°F | Resp 18

## 2023-09-12 DIAGNOSIS — C20 Malignant neoplasm of rectum: Secondary | ICD-10-CM

## 2023-09-12 DIAGNOSIS — Z5111 Encounter for antineoplastic chemotherapy: Secondary | ICD-10-CM | POA: Diagnosis not present

## 2023-09-12 DIAGNOSIS — Z95828 Presence of other vascular implants and grafts: Secondary | ICD-10-CM

## 2023-09-12 MED ORDER — SODIUM CHLORIDE 0.9% FLUSH
10.0000 mL | INTRAVENOUS | Status: DC | PRN
  Administered 2023-09-12: 10 mL

## 2023-09-12 MED ORDER — HEPARIN SOD (PORK) LOCK FLUSH 100 UNIT/ML IV SOLN
500.0000 [IU] | Freq: Once | INTRAVENOUS | Status: AC | PRN
  Administered 2023-09-12: 500 [IU]

## 2023-09-12 NOTE — Patient Instructions (Signed)

## 2023-09-12 NOTE — Progress Notes (Signed)
Patient for chemotherapy pump disconnect with no complaints voiced.  Patients port flushed without difficulty.  Good blood return noted with no bruising or swelling noted at site.  Band aid applied.  VSS with discharge and left ambulatory with no s/s of distress noted.   

## 2023-09-25 ENCOUNTER — Inpatient Hospital Stay: Payer: BC Managed Care – PPO

## 2023-09-25 ENCOUNTER — Inpatient Hospital Stay: Payer: BC Managed Care – PPO | Admitting: Hematology

## 2023-09-25 VITALS — BP 97/50 | HR 62 | Temp 97.6°F | Resp 18

## 2023-09-25 DIAGNOSIS — C20 Malignant neoplasm of rectum: Secondary | ICD-10-CM

## 2023-09-25 DIAGNOSIS — Z95828 Presence of other vascular implants and grafts: Secondary | ICD-10-CM

## 2023-09-25 DIAGNOSIS — Z5111 Encounter for antineoplastic chemotherapy: Secondary | ICD-10-CM | POA: Diagnosis not present

## 2023-09-25 LAB — COMPREHENSIVE METABOLIC PANEL
ALT: 31 U/L (ref 0–44)
AST: 19 U/L (ref 15–41)
Albumin: 3.1 g/dL — ABNORMAL LOW (ref 3.5–5.0)
Alkaline Phosphatase: 95 U/L (ref 38–126)
Anion gap: 8 (ref 5–15)
BUN: 6 mg/dL (ref 6–20)
CO2: 24 mmol/L (ref 22–32)
Calcium: 9.2 mg/dL (ref 8.9–10.3)
Chloride: 105 mmol/L (ref 98–111)
Creatinine, Ser: 0.74 mg/dL (ref 0.44–1.00)
GFR, Estimated: >60 mL/min (ref >60)
Glucose, Bld: 125 mg/dL — ABNORMAL HIGH (ref 70–99)
Potassium: 3.8 mmol/L (ref 3.5–5.1)
Sodium: 137 mmol/L (ref 135–145)
Total Bilirubin: 0.3 mg/dL (ref 0.3–1.2)
Total Protein: 6.8 g/dL (ref 6.5–8.1)

## 2023-09-25 LAB — CBC WITH DIFFERENTIAL/PLATELET
Abs Immature Granulocytes: 0.06 10*3/uL (ref 0.00–0.07)
Basophils Absolute: 0 10*3/uL (ref 0.0–0.1)
Basophils Relative: 1 %
Eosinophils Absolute: 0.2 10*3/uL (ref 0.0–0.5)
Eosinophils Relative: 7 %
HCT: 36.6 % (ref 36.0–46.0)
Hemoglobin: 11.5 g/dL — ABNORMAL LOW (ref 12.0–15.0)
Immature Granulocytes: 2 %
Lymphocytes Relative: 27 %
Lymphs Abs: 0.8 10*3/uL (ref 0.7–4.0)
MCH: 30.5 pg (ref 26.0–34.0)
MCHC: 31.4 g/dL (ref 30.0–36.0)
MCV: 97.1 fL (ref 80.0–100.0)
Monocytes Absolute: 0.3 10*3/uL (ref 0.1–1.0)
Monocytes Relative: 8 %
Neutro Abs: 1.7 10*3/uL (ref 1.7–7.7)
Neutrophils Relative %: 55 %
Platelets: 338 10*3/uL (ref 150–400)
RBC: 3.77 MIL/uL — ABNORMAL LOW (ref 3.87–5.11)
RDW: 14.2 % (ref 11.5–15.5)
WBC: 3.1 10*3/uL — ABNORMAL LOW (ref 4.0–10.5)
nRBC: 0 % (ref 0.0–0.2)

## 2023-09-25 LAB — MAGNESIUM: Magnesium: 2.1 mg/dL (ref 1.7–2.4)

## 2023-09-25 MED ORDER — SODIUM CHLORIDE 0.9 % IV SOLN: Freq: Once | INTRAVENOUS | Status: AC

## 2023-09-25 MED ORDER — DEXAMETHASONE SODIUM PHOSPHATE 10 MG/ML IJ SOLN
10.0000 mg | Freq: Once | INTRAMUSCULAR | Status: AC
  Administered 2023-09-25: 10 mg via INTRAVENOUS
  Filled 2023-09-25: qty 1

## 2023-09-25 MED ORDER — PALONOSETRON HCL INJECTION 0.25 MG/5ML
0.2500 mg | Freq: Once | INTRAVENOUS | Status: AC
Start: 2023-09-25 — End: 2023-09-25
  Administered 2023-09-25: 0.25 mg via INTRAVENOUS
  Filled 2023-09-25: qty 5

## 2023-09-25 MED ORDER — SODIUM CHLORIDE 0.9 % IV SOLN
2400.0000 mg/m2 | INTRAVENOUS | Status: DC
  Administered 2023-09-25: 4500 mg via INTRAVENOUS
  Filled 2023-09-25: qty 90

## 2023-09-25 MED ORDER — SODIUM CHLORIDE 0.9 % IV SOLN: 10.0000 mg | Freq: Once | INTRAVENOUS | Status: DC

## 2023-09-25 MED ORDER — SODIUM CHLORIDE 0.9 % IV SOLN
400.0000 mg/m2 | Freq: Once | INTRAVENOUS | Status: AC
  Administered 2023-09-25: 748 mg via INTRAVENOUS
  Filled 2023-09-25: qty 37.4

## 2023-09-25 MED ORDER — IRINOTECAN HCL CHEMO INJECTION 100 MG/5ML
180.0000 mg/m2 | Freq: Once | INTRAVENOUS | Status: AC
  Administered 2023-09-25: 340 mg via INTRAVENOUS
  Filled 2023-09-25: qty 17

## 2023-09-25 MED ORDER — SODIUM CHLORIDE 0.9% FLUSH
10.0000 mL | Freq: Once | INTRAVENOUS | Status: AC
  Administered 2023-09-25: 10 mL via INTRAVENOUS

## 2023-09-25 MED ORDER — FLUOROURACIL CHEMO INJECTION 2.5 GM/50ML
400.0000 mg/m2 | Freq: Once | INTRAVENOUS | Status: AC
  Administered 2023-09-25: 750 mg via INTRAVENOUS
  Filled 2023-09-25: qty 15

## 2023-09-25 NOTE — Patient Instructions (Signed)
[facility]  Discharge Instructions: Thank you for choosing [facility] to provide your oncology and hematology care.  If you have a lab appointment with the Cancer Center - please note that after April 8th, 2024, all labs will be drawn in the cancer center.  You do not have to check in or register with the main entrance as you have in the past but will complete your check-in in the cancer center.  Wear comfortable clothing and clothing appropriate for easy access to any Portacath or PICC line.   We strive to give you quality time with your provider. You may need to reschedule your appointment if you arrive late (15 or more minutes).  Arriving late affects you and other patients whose appointments are after yours.  Also, if you miss three or more appointments without notifying the office, you may be dismissed from the clinic at the provider's discretion.      For prescription refill requests, have your pharmacy contact our office and allow 72 hours for refills to be completed.    Today you received the following chemotherapy and/or immunotherapy agents Folfiri   To help prevent nausea and vomiting after your treatment, we encourage you to take your nausea medication as directed.    BELOW ARE SYMPTOMS THAT SHOULD BE REPORTED IMMEDIATELY: *FEVER GREATER THAN 100.4 F (38 C) OR HIGHER *CHILLS OR SWEATING *NAUSEA AND VOMITING THAT IS NOT CONTROLLED WITH YOUR NAUSEA MEDICATION *UNUSUAL SHORTNESS OF BREATH *UNUSUAL BRUISING OR BLEEDING *URINARY PROBLEMS (pain or burning when urinating, or frequent urination) *BOWEL PROBLEMS (unusual diarrhea, constipation, pain near the anus) TENDERNESS IN MOUTH AND THROAT WITH OR WITHOUT PRESENCE OF ULCERS (sore throat, sores in mouth, or a toothache) UNUSUAL RASH, SWELLING OR PAIN  UNUSUAL VAGINAL DISCHARGE OR ITCHING   Items with * indicate a potential emergency and should be followed up as soon as possible or go to the  Emergency Department if any problems should occur.  Please show the CHEMOTHERAPY ALERT CARD or IMMUNOTHERAPY ALERT CARD at check-in to the Emergency Department and triage nurse.  Should you have questions after your visit or need to cancel or reschedule your appointment, please contact [facility] [phone]  and follow the prompts.  Office hours are 8:00 a.m. to 4:30 p.m. Monday - Friday. Please note that voicemails left after 4:00 p.m. may not be returned until the following business day.  We are closed weekends and major holidays. You have access to a nurse at all times for urgent questions. Please call the main number to the clinic [phone] and follow the prompts.  For any non-urgent questions, you may also contact your provider using MyChart. We now offer e-Visits for anyone 27 and older to request care online for non-urgent symptoms. For details visit [email or web address].   Also download the MyChart app! Go to the app store, search "MyChart", open the app, select [facility], and log in with your MyChart username and password.

## 2023-09-25 NOTE — Progress Notes (Signed)
Patient presents today for Folfiri infusion. Patient is in satisfactory condition with no new complaints voiced.  Vital signs are stable.  Labs reviewed and all labs are within treatment parameters.  We will proceed with treatment per MD orders.    Treatment given today per MD orders. Tolerated infusion without adverse affects. Vital signs stable. No complaints at this time. Discharged from clinic ambulatory in stable condition. Alert and oriented x 3. F/U with [facility] as scheduled. 5FU ambulatory pump infusing.

## 2023-09-26 LAB — CEA: CEA: 11.6 ng/mL — ABNORMAL HIGH (ref 0.0–4.7)

## 2023-09-27 ENCOUNTER — Inpatient Hospital Stay: Payer: BC Managed Care – PPO

## 2023-09-27 ENCOUNTER — Other Ambulatory Visit: Payer: Self-pay

## 2023-09-27 VITALS — BP 98/62 | HR 66 | Temp 97.6°F | Resp 18

## 2023-09-27 DIAGNOSIS — C20 Malignant neoplasm of rectum: Secondary | ICD-10-CM

## 2023-09-27 DIAGNOSIS — Z5111 Encounter for antineoplastic chemotherapy: Secondary | ICD-10-CM | POA: Diagnosis not present

## 2023-09-27 DIAGNOSIS — Z95828 Presence of other vascular implants and grafts: Secondary | ICD-10-CM

## 2023-09-27 MED ORDER — SODIUM CHLORIDE 0.9% FLUSH
10.0000 mL | INTRAVENOUS | Status: DC | PRN
  Administered 2023-09-27: 10 mL

## 2023-09-27 MED ORDER — HEPARIN SOD (PORK) LOCK FLUSH 100 UNIT/ML IV SOLN
500.0000 [IU] | Freq: Once | INTRAVENOUS | Status: AC | PRN
  Administered 2023-09-27: 500 [IU]

## 2023-09-27 NOTE — Progress Notes (Signed)
Patient for chemotherapy pump disconnect with no complaints voiced.  Patients port flushed without difficulty.  Good blood return noted with no bruising or swelling noted at site.  Band aid applied.  VSS with discharge and left ambulatory with no s/s of distress noted.   

## 2023-09-27 NOTE — Patient Instructions (Signed)

## 2023-10-01 ENCOUNTER — Ambulatory Visit (HOSPITAL_COMMUNITY)
Admission: RE | Admit: 2023-10-01 | Discharge: 2023-10-01 | Disposition: A | Discharge status: Home / Self Care | Payer: BC Managed Care – PPO | Source: Ambulatory Visit | Attending: Hematology | Admitting: Hematology

## 2023-10-01 DIAGNOSIS — C787 Secondary malignant neoplasm of liver and intrahepatic bile duct: Secondary | ICD-10-CM | POA: Diagnosis present

## 2023-10-01 DIAGNOSIS — C20 Malignant neoplasm of rectum: Secondary | ICD-10-CM | POA: Insufficient documentation

## 2023-10-01 MED ORDER — IOHEXOL 300 MG/ML  SOLN
100.0000 mL | Freq: Once | INTRAMUSCULAR | Status: AC | PRN
  Administered 2023-10-01: 100 mL via INTRAVENOUS

## 2023-10-08 ENCOUNTER — Inpatient Hospital Stay: Payer: BC Managed Care – PPO | Attending: Hematology

## 2023-10-08 ENCOUNTER — Inpatient Hospital Stay: Payer: BC Managed Care – PPO | Admitting: Hematology

## 2023-10-08 ENCOUNTER — Inpatient Hospital Stay: Payer: BC Managed Care – PPO

## 2023-10-08 VITALS — BP 108/52 | HR 70 | Temp 97.8°F | Resp 18

## 2023-10-08 DIAGNOSIS — F17210 Nicotine dependence, cigarettes, uncomplicated: Secondary | ICD-10-CM | POA: Diagnosis not present

## 2023-10-08 DIAGNOSIS — R197 Diarrhea, unspecified: Secondary | ICD-10-CM | POA: Insufficient documentation

## 2023-10-08 DIAGNOSIS — G629 Polyneuropathy, unspecified: Secondary | ICD-10-CM | POA: Diagnosis not present

## 2023-10-08 DIAGNOSIS — C19 Malignant neoplasm of rectosigmoid junction: Secondary | ICD-10-CM | POA: Diagnosis not present

## 2023-10-08 DIAGNOSIS — C20 Malignant neoplasm of rectum: Secondary | ICD-10-CM

## 2023-10-08 DIAGNOSIS — Z801 Family history of malignant neoplasm of trachea, bronchus and lung: Secondary | ICD-10-CM | POA: Diagnosis not present

## 2023-10-08 DIAGNOSIS — Z95828 Presence of other vascular implants and grafts: Secondary | ICD-10-CM

## 2023-10-08 DIAGNOSIS — Z806 Family history of leukemia: Secondary | ICD-10-CM | POA: Diagnosis not present

## 2023-10-08 DIAGNOSIS — Z5111 Encounter for antineoplastic chemotherapy: Secondary | ICD-10-CM | POA: Diagnosis present

## 2023-10-08 DIAGNOSIS — Z800 Family history of malignant neoplasm of digestive organs: Secondary | ICD-10-CM | POA: Diagnosis not present

## 2023-10-08 DIAGNOSIS — C787 Secondary malignant neoplasm of liver and intrahepatic bile duct: Secondary | ICD-10-CM | POA: Insufficient documentation

## 2023-10-08 DIAGNOSIS — Z803 Family history of malignant neoplasm of breast: Secondary | ICD-10-CM | POA: Diagnosis not present

## 2023-10-08 LAB — COMPREHENSIVE METABOLIC PANEL
ALT: 10 U/L (ref 0–44)
AST: 13 U/L — ABNORMAL LOW (ref 15–41)
Albumin: 3.5 g/dL (ref 3.5–5.0)
Alkaline Phosphatase: 83 U/L (ref 38–126)
Anion gap: 7 (ref 5–15)
BUN: 12 mg/dL (ref 6–20)
CO2: 22 mmol/L (ref 22–32)
Calcium: 9 mg/dL (ref 8.9–10.3)
Chloride: 105 mmol/L (ref 98–111)
Creatinine, Ser: 0.81 mg/dL (ref 0.44–1.00)
GFR, Estimated: >60 mL/min (ref >60)
Glucose, Bld: 86 mg/dL (ref 70–99)
Potassium: 3.8 mmol/L (ref 3.5–5.1)
Sodium: 134 mmol/L — ABNORMAL LOW (ref 135–145)
Total Bilirubin: <0.1 mg/dL (ref <1.2)
Total Protein: 6.6 g/dL (ref 6.5–8.1)

## 2023-10-08 LAB — URINALYSIS, DIPSTICK ONLY
Bilirubin Urine: NEGATIVE
Glucose, UA: NEGATIVE mg/dL
Hgb urine dipstick: NEGATIVE
Ketones, ur: NEGATIVE mg/dL
Nitrite: NEGATIVE
Protein, ur: NEGATIVE mg/dL
Specific Gravity, Urine: 1.015 (ref 1.005–1.030)
pH: 5 (ref 5.0–8.0)

## 2023-10-08 LAB — CBC WITH DIFFERENTIAL/PLATELET
Abs Immature Granulocytes: 0.01 10*3/uL (ref 0.00–0.07)
Basophils Absolute: 0 10*3/uL (ref 0.0–0.1)
Basophils Relative: 1 %
Eosinophils Absolute: 0.2 10*3/uL (ref 0.0–0.5)
Eosinophils Relative: 6 %
HCT: 37.3 % (ref 36.0–46.0)
Hemoglobin: 12.3 g/dL (ref 12.0–15.0)
Immature Granulocytes: 0 %
Lymphocytes Relative: 17 %
Lymphs Abs: 0.7 10*3/uL (ref 0.7–4.0)
MCH: 32.7 pg (ref 26.0–34.0)
MCHC: 33 g/dL (ref 30.0–36.0)
MCV: 99.2 fL (ref 80.0–100.0)
Monocytes Absolute: 0.3 10*3/uL (ref 0.1–1.0)
Monocytes Relative: 6 %
Neutro Abs: 3.1 10*3/uL (ref 1.7–7.7)
Neutrophils Relative %: 70 %
Platelets: 242 10*3/uL (ref 150–400)
RBC: 3.76 MIL/uL — ABNORMAL LOW (ref 3.87–5.11)
RDW: 16.2 % — ABNORMAL HIGH (ref 11.5–15.5)
WBC: 4.4 10*3/uL (ref 4.0–10.5)
nRBC: 0 % (ref 0.0–0.2)

## 2023-10-08 LAB — MAGNESIUM: Magnesium: 2.2 mg/dL (ref 1.7–2.4)

## 2023-10-08 MED ORDER — SODIUM CHLORIDE 0.9 % IV SOLN: 10.0000 mg | Freq: Once | INTRAVENOUS | Status: DC

## 2023-10-08 MED ORDER — SODIUM CHLORIDE 0.9 % IV SOLN
2400.0000 mg/m2 | INTRAVENOUS | Status: DC
  Administered 2023-10-08: 4500 mg via INTRAVENOUS
  Filled 2023-10-08: qty 90

## 2023-10-08 MED ORDER — SODIUM CHLORIDE 0.9 % IV SOLN: Freq: Once | INTRAVENOUS | Status: AC

## 2023-10-08 MED ORDER — DEXAMETHASONE SODIUM PHOSPHATE 10 MG/ML IJ SOLN
10.0000 mg | Freq: Once | INTRAMUSCULAR | Status: AC
  Administered 2023-10-08: 10 mg via INTRAVENOUS
  Filled 2023-10-08: qty 1

## 2023-10-08 MED ORDER — SODIUM CHLORIDE 0.9 % IV SOLN
180.0000 mg/m2 | Freq: Once | INTRAVENOUS | Status: AC
  Administered 2023-10-08: 340 mg via INTRAVENOUS
  Filled 2023-10-08: qty 17

## 2023-10-08 MED ORDER — ATROPINE SULFATE 1 MG/ML IV SOLN
INTRAVENOUS | Status: AC
  Filled 2023-10-08: qty 1

## 2023-10-08 MED ORDER — ATROPINE SULFATE 1 MG/ML IV SOLN
0.5000 mg | Freq: Once | INTRAVENOUS | Status: AC | PRN
  Administered 2023-10-08: 0.5 mg via INTRAVENOUS

## 2023-10-08 MED ORDER — FLUOROURACIL CHEMO INJECTION 2.5 GM/50ML
400.0000 mg/m2 | Freq: Once | INTRAVENOUS | Status: AC
  Administered 2023-10-08: 750 mg via INTRAVENOUS
  Filled 2023-10-08: qty 15

## 2023-10-08 MED ORDER — PALONOSETRON HCL INJECTION 0.25 MG/5ML
0.2500 mg | Freq: Once | INTRAVENOUS | Status: AC
  Administered 2023-10-08: 0.25 mg via INTRAVENOUS
  Filled 2023-10-08: qty 5

## 2023-10-08 MED ORDER — SODIUM CHLORIDE 0.9% FLUSH
10.0000 mL | INTRAVENOUS | Status: DC | PRN
  Administered 2023-10-08: 10 mL via INTRAVENOUS

## 2023-10-08 MED ORDER — SODIUM CHLORIDE 0.9 % IV SOLN
400.0000 mg/m2 | Freq: Once | INTRAVENOUS | Status: AC
  Administered 2023-10-08: 748 mg via INTRAVENOUS
  Filled 2023-10-08: qty 37.4

## 2023-10-08 MED ORDER — LEVOFLOXACIN 500 MG PO TABS: 500.0000 mg | ORAL_TABLET | Freq: Every day | ORAL | 0 refills | Status: DC

## 2023-10-08 NOTE — Progress Notes (Signed)
Patients port flushed without difficulty.  Good blood return noted with no bruising or swelling noted at site.  Patient remains accessed for treatment.  

## 2023-10-08 NOTE — Patient Instructions (Signed)
[facility] DE[facility] OF [facility]  Discharge Instructions: Thank you for choosing [facility] to provide your oncology and hematology care.  If you have a lab appointment with the Cancer Center - please note that after April 8th, 2024, all labs will be drawn in the cancer center.  You do not have to check in or register with the main entrance as you have in the past but will complete your check-in in the cancer center.  Wear comfortable clothing and clothing appropriate for easy access to any Portacath or PICC line.   We strive to give you quality time with your provider. You may need to reschedule your appointment if you arrive late (15 or more minutes).  Arriving late affects you and other patients whose appointments are after yours.  Also, if you miss three or more appointments without notifying the office, you may be dismissed from the clinic at the provider's discretion.      For prescription refill requests, have your pharmacy contact our office and allow 72 hours for refills to be completed.    Today you received the following chemotherapy and/or immunotherapy agents irrinotecan, leucovorin. adrucil   To help prevent nausea and vomiting after your treatment, we encourage you to take your nausea medication as directed.  BELOW ARE SYMPTOMS THAT SHOULD BE REPORTED IMMEDIATELY: *FEVER GREATER THAN 100.4 F (38 C) OR HIGHER *CHILLS OR SWEATING *NAUSEA AND VOMITING THAT IS NOT CONTROLLED WITH YOUR NAUSEA MEDICATION *UNUSUAL SHORTNESS OF BREATH *UNUSUAL BRUISING OR BLEEDING *URINARY PROBLEMS (pain or burning when urinating, or frequent urination) *BOWEL PROBLEMS (unusual diarrhea, constipation, pain near the anus) TENDERNESS IN MOUTH AND THROAT WITH OR WITHOUT PRESENCE OF ULCERS (sore throat, sores in mouth, or a toothache) UNUSUAL RASH, SWELLING OR PAIN  UNUSUAL VAGINAL DISCHARGE OR ITCHING   Items with * indicate a potential emergency and  should be followed up as soon as possible or go to the Emergency Department if any problems should occur.  Please show the CHEMOTHERAPY ALERT CARD or IMMUNOTHERAPY ALERT CARD at check-in to the Emergency Department and triage nurse.  Should you have questions after your visit or need to cancel or reschedule your appointment, please contact [facility] - A DEPT OF [facility] [phone]  and follow the prompts.  Office hours are 8:00 a.m. to 4:30 p.m. Monday - Friday. Please note that voicemails left after 4:00 p.m. may not be returned until the following business day.  We are closed weekends and major holidays. You have access to a nurse at all times for urgent questions. Please call the main number to the clinic [phone] and follow the prompts.  For any non-urgent questions, you may also contact your provider using MyChart. We now offer e-Visits for anyone 43 and older to request care online for non-urgent symptoms. For details visit [email or web address].   Also download the MyChart app! Go to the app store, search "MyChart", open the app, select , and log in with your MyChart username and password.

## 2023-10-08 NOTE — Progress Notes (Signed)
Labs reviewed today. Proceed as planned per Dr. Ellin Saba  Treatment given per orders. Patient tolerated it well without problems. Vitals stable and discharged home from clinic ambulatory. Follow up as scheduled.

## 2023-10-08 NOTE — Patient Instructions (Addendum)
[facility] at [facility] Discharge Instructions   You were seen and examined today by Dr. Ellin Saba.  Lab is having technical issues and your lab results from today are currently pending at this time.   He reviewed the results of your CT scan which is stable. The cancer has not grown or spread.   We will proceed with your treatment today pending the lab results.   Return as scheduled.    Thank you for choosing [facility] at [facility] to provide your oncology and hematology care.  To afford each patient quality time with our provider, please arrive at least 15 minutes before your scheduled appointment time.   If you have a lab appointment with the Cancer Center please come in thru the Main Entrance and check in at the main information desk.  You need to re-schedule your appointment should you arrive 10 or more minutes late.  We strive to give you quality time with our providers, and arriving late affects you and other patients whose appointments are after yours.  Also, if you no show three or more times for appointments you may be dismissed from the clinic at the providers discretion.     Again, thank you for choosing [facility].  Our hope is that these requests will decrease the amount of time that you wait before being seen by our physicians.       _____________________________________________________________  Should you have questions after your visit to [facility], please contact our office at [phone] and follow the prompts.  Our office hours are 8:00 a.m. and 4:30 p.m. Monday - Friday.  Please note that voicemails left after 4:00 p.m. may not be returned until the following business day.  We are closed weekends and major holidays.  You do have access to a nurse 24-7, just call the main number to the clinic [phone] and do not press any options, hold on the line and a nurse will answer the phone.     For prescription refill requests, have your pharmacy contact our office and allow 72 hours.    Due to Covid, you will need to wear a mask upon entering the hospital. If you do not have a mask, a mask will be given to you at the Main Entrance upon arrival. For doctor visits, patients may have 1 support person age 43 or older with them. For treatment visits, patients can not have anyone with them due to social distancing guidelines and our immunocompromised population.

## 2023-10-08 NOTE — Progress Notes (Signed)
[facility] [address] [place] [address]    Clinic Day:  10/08/2023  Referring physician: Junie Spencer, FNP  Patient Care Team: Junie Spencer, FNP as PCP - General (Family Medicine) Terri Massed, MD as Medical Oncologist (Medical Oncology) Therese Sarah, RN as Oncology Nurse Navigator (Medical Oncology)   ASSESSMENT & PLAN:   Assessment: 1. 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  Stools have become bloody/mucousy lately. - She also reported pain in the tailbone region since March 2023.  She also has right-sided lower back pain. - 50 pound weight loss in the last 9 months, part of weight loss was intentional.  She cut back on eating sweets and lost taste to sweets after COVID infection. - CT [facility] with contrast on 03/08/2022: Irregular circumferential masslike wall thickening of sigmoid colon/rectum with adjacent perirectal adenopathy.  Multiple small hypodense lesions in the liver, largest 2.9 cm in the central aspect of the liver, question metastatic disease. - Colonoscopy on 03/21/2022 by Dr. Marletta Lor: Fungating infiltrative nearly completely obstructing mass in the rectosigmoid colon, mass was circumferential measuring 4 cm in length. - Pathology: Rectal mass biopsy consistent with invasive moderately differentiated adenocarcinoma.  As there is very scant invasive tumor, MSI studies were deferred. - PET scan (03/23/2022): Hypermetabolic rectal primary long-segment with SUV 14.3.  Left posterior perirectal lymph node 7 mm with SUV 2.9.  Multiple tiny foci of hepatic hypermetabolism. - MRI of the liver (04/01/2022): Multiple small hypovascular rim-enhancing liver lesions, predominantly in the right hepatic lobe measuring up to 1.1 cm.  3.3 cm hypervascular mass in the central liver, most consistent with FNH/hepatic adenoma. - Liver biopsy (04/20/2022): Metastatic moderately  differentiated colonic adenocarcinoma with mucinous features - NGS testing shows K-ras G12 R mutation, PIK3CA exon 21 mutation.  MS-stable.  TMB-low. - Cycle 1 of FOLFOXIRI on 05/29/2022, bevacizumab added during cycle 2, cycle 13 on 11/15/2022 -MRI pelvis (10/23/2022): Known metastatic rectal cancer with persistent rectal narrowing and signs of involvement of posterior cervical stroma and anterior peritoneal reflection.  There is evidence of residual tumor in the posterior cervix.  Persistent presacral edema not substantially changed. - MRI liver (11/10/2022): 3 benign lesions and previous small rim-enhancing metastatic lesions in the liver are no longer seen. - Chemoradiation therapy with Xeloda from 12/11/2022 through 01/18/2023. - 04/04/2023: Robotic assisted LAR, diverting ileostomy, TAH with BSO - Pathology: YpT4b, ypN0, YPM1, 0/12 lymph nodes involved, no tumor deposits identified, margins negative, LVI negative, PNI positive, 1.5 cm grade 2 adenocarcinoma in the rectosigmoid colon, no perforation, MMR preserved.   2. Social/family history: - She is separated and is seen today with her daughter.  She works as a Pensions consultant at [employer].  She is current active smoker, 1 pack/day for 27 years.  Denies drinking alcohol. - Paternal aunt had colon cancer.  Maternal cousin has breast cancer.  Maternal uncle had leukemia and maternal cousin had leukemia.    Plan: Metastatic rectal cancer to the liver: - She completed 6 cycles of FOLFIRI.  Bevacizumab is on hold. - Reviewed labs: CEA is 11.6 on 09/25/2023, up from 9.0. - CT CAP (10/01/2023): Multiple hypodense liver metastasis, 1 of which decreased in size.  Others are significantly stable.  Previously seen thick-walled cavitary nodule of the posterior right upper lobe is now very thin-walled.  New consolidation of the inferior lingula.  Previously seen right eccentric soft tissue nodule  adjacent to the rectum is resolved. - She complains of cough with yellow  expectoration.  Will give her Levaquin 500 milligrams x 7 days. - She met with Dr. Maisie Fus on 09/10/2023 for ileostomy reversal.  Dr. Maisie Fus would like her to be off chemotherapy for few weeks perioperatively. - We reviewed labs today: CBC was normal.  LFTs were normal.  UA was negative for protein.  She will proceed with FOLFIRI today.  Will continue to hold bevacizumab. - Will reach out to Dr. Maisie Fus for date of surgery and hold chemotherapy around it.  RTC 6 weeks for follow-up.  2.  Diarrhea: - Ileostomy output is stable.  Continue Imodium as needed.  3.  Peripheral neuropathy: - Tingling and numbness from ankles to toes is stable.  No numbness in the hands.    No orders of the defined types were placed in this encounter.     I,Katie Daubenspeck,acting as a Neurosurgeon for Terri Massed, MD.,have documented all relevant documentation on the behalf of Terri Massed, MD,as directed by  Terri Massed, MD while in the presence of Terri Massed, MD.   I, Terri Massed MD, have reviewed the above documentation for accuracy and completeness, and I agree with the above.   Terri Massed, MD   11/4/20247:08 PM  CHIEF COMPLAINT:   Diagnosis: metastatic rectal cancer to the liver    Cancer Staging  Rectal cancer [facility]) Staging form: Colon and Rectum, AJCC 8th Edition - Clinical stage from 03/28/2022: Stage IVA (cTX, cN1, cM1a) - Unsigned    Prior Therapy: 1. FOLFOXIRI and bevacizumab, 13 cycles, 05/29/22 - 11/15/22  2. Xeloda with radiation therapy, 12/11/22 - 01/18/23  Current Therapy:  FOLFIRI    HISTORY OF PRESENT ILLNESS:   Oncology History  Rectal cancer (HCC)  03/28/2022 Initial Diagnosis   Rectal cancer (HCC)   05/29/2022 - 07/26/2022 Chemotherapy   Patient is on Treatment Plan : COLORECTAL FOLFOXIRI + Bevacizumab q14d     05/29/2022 - 11/17/2022 Chemotherapy   Patient is on Treatment Plan : COLORECTAL FOLFOXIRI + Bevacizumab q14d      Genetic  Testing   No pathogenic variants identified on the [employer] CustomNext+RNA panel. The report date is 09/08/2022.  The CustomNext-Cancer+RNAinsight panel offered by [facility] includes sequencing and rearrangement analysis for the following 47 genes:  APC, ATM, AXIN2, BARD1, BMPR1A, BRCA1, BRCA2, BRIP1, CDH1, CDK4, CDKN2A, CHEK2, DICER1, EPCAM, GREM1, HOXB13, MEN1, MLH1, MSH2, MSH3, MSH6, MUTYH, NBN, NF1, NF2, NTHL1, PALB2, PMS2, POLD1, POLE, PTEN, RAD51C, RAD51D, RECQL, RET, SDHA, SDHAF2, SDHB, SDHC, SDHD, SMAD4, SMARCA4, STK11, TP53, TSC1, TSC2, and VHL.  RNA data is routinely analyzed for use in variant interpretation for all genes.   07/16/2023 -  Chemotherapy   Patient is on Treatment Plan : COLORECTAL FOLFIRI + Bevacizumab q14d        INTERVAL HISTORY:   Lawrencia is a 42 y.o. female presenting to clinic today for follow up of metastatic rectal cancer to the liver. She was last seen by me on 09/10/23.  Since her last visit, she underwent restaging CT C/A/P on 10/01/23.   Today, she states that she is doing well overall. Her appetite level is at 90%. Her energy level is at 90%.  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   Port-A-Cath in place 05/24/2022   upper Rt chest   Tobacco  abuse     Surgical History: Past Surgical History:  Procedure Laterality Date   BIOPSY  03/21/2022   Procedure: BIOPSY;  Surgeon: Lanelle Bal, DO;  [facility];  Service: Endoscopy;;   COLONOSCOPY WITH PROPOFOL N/A 03/21/2022   Procedure: COLONOSCOPY WITH PROPOFOL;  Surgeon: Lanelle Bal, DO;  Location: [facility];  Service: Endoscopy;  Laterality: N/A;  8:30am   FLEXIBLE SIGMOIDOSCOPY N/A 05/19/2022   Procedure: FLEXIBLE SIGMOIDOSCOPY WITH TATTOO INJECTION;  Surgeon: Romie Levee, MD;  Location: [facility];  Service: General;  Laterality: N/A;    ILEOSTOMY N/A 04/04/2023   Procedure: DIVERTING ILEOSTOMY;  Surgeon: Romie Levee, MD;  Location: [facility];  Service: General;  Laterality: N/A;   LAPAROSCOPIC DIVERTED COLOSTOMY N/A 05/19/2022   Procedure: LAPAROSCOPIC DIVERTED OSTOMY;  Surgeon: Romie Levee, MD;  Location: [facility];  Service: General;  Laterality: N/A;   PORTACATH PLACEMENT Right 05/19/2022   Procedure: INSERTION PORT-A-CATH;  Surgeon: Romie Levee, MD;  Location: [facility];  Service: General;  Laterality: Right;   ROBOTIC ASSISTED TOTAL HYSTERECTOMY WITH BILATERAL SALPINGO OOPHERECTOMY N/A 04/04/2023   Procedure: XI ROBOTIC ASSISTED TOTAL HYSTERECTOMY WITH BILATERAL SALPINGO OOPHORECTOMY;  Surgeon: Carver Fila, MD;  Location: [facility];  Service: Gynecology;  Laterality: N/A;   XI ROBOTIC ASSISTED LOWER ANTERIOR RESECTION N/A 04/04/2023   Procedure: XI ROBOTIC ASSISTED LOWER ANTERIOR RESECTION;  Surgeon: Romie Levee, MD;  Location: [facility];  Service: General;  Laterality: N/A;    Social History: Social History   Socioeconomic History   Marital status: Married    Spouse name: Not on file   Number of children: Not on file   Years of education: Not on file   Highest education level: Not on file  Occupational History   Not on file  Tobacco Use   Smoking status: Every Day    Current packs/day: 0.25    Average packs/day: 0.3 packs/day for 27.0 years (6.8 ttl pk-yrs)    Types: Cigarettes   Smokeless tobacco: Never  Vaping Use   Vaping status: Never Used  Substance and Sexual Activity   Alcohol use: No   Drug use: No   Sexual activity: Yes    Birth control/protection: Injection  Other Topics Concern   Not on file  Social History Narrative   Not on file   Social Determinants of Health   Financial Resource Strain: Not on file  Food Insecurity: No Food Insecurity (04/04/2023)   Hunger Vital Sign    Worried About Running Out of Food in the Last Year: Never true    Ran Out of Food in the Last Year: Never true   Transportation Needs: No Transportation Needs (04/04/2023)   PRAPARE - Administrator, Civil Service (Medical): No    Lack of Transportation (Non-Medical): No  Physical Activity: Not on file  Stress: Not on file  Social Connections: Not on file  Intimate Partner Violence: Not At Risk (04/04/2023)   Humiliation, Afraid, Rape, and Kick questionnaire    Fear of Current or Ex-Partner: No    Emotionally Abused: No    Physically Abused: No    Sexually Abused: No    Family History: Family History  Problem Relation Age of Onset   Heart disease Mother    Diabetes Mother    Hearing loss Father    Heart disease Father    Hyperlipidemia Father    Hypertension Father    Stroke Father    Arthritis Father  Diabetes Father    Learning disabilities Brother    Diabetes Maternal Grandmother    Heart disease Maternal Grandmother    Diabetes Maternal Grandfather    Diabetes Paternal Grandmother    Diabetes Paternal Grandfather    Leukemia Maternal Uncle        d. 73s   Colon cancer Paternal Aunt        dx 96s   Breast cancer Cousin 33   Lung cancer Maternal Aunt     Current Medications:  Current Outpatient Medications:    estradiol (ESTRACE VAGINAL) 0.1 MG/GM vaginal cream, 1 week after surgery, begin to use cream nightly at bedtime for 2 weeks then 3 times a week after to assist with healing vagina. Use a fingertip size amount of cream on finger and place slightly past vaginal entrance. Do not use the applicator that comes with cream., Disp: 42.5 g, Rfl: 3   levofloxacin (LEVAQUIN) 500 MG tablet, Take 1 tablet (500 mg total) by mouth daily., Disp: 7 tablet, Rfl: 0   Multiple Vitamin (MULTIVITAMIN) tablet, Take 1 tablet by mouth daily., Disp: , Rfl:    oxyCODONE (OXY IR/ROXICODONE) 5 MG immediate release tablet, Take 1 tablet (5 mg total) by mouth every 6 (six) hours as needed for severe pain., Disp: 20 tablet, Rfl: 0   vitamin B-12 (CYANOCOBALAMIN) 250 MCG tablet, Take 250 mcg  by mouth daily., Disp: , Rfl:  No current facility-administered medications for this visit.  Facility-Administered Medications Ordered in Other Visits:    atropine 1 MG/ML injection, , , ,    fluorouracil (ADRUCIL) 4,500 mg in sodium chloride 0.9 % 60 mL chemo infusion, 2,400 mg/m2 (Treatment Plan Recorded), Intravenous, 1 day or 1 dose, Terri Massed, MD, Infusion Verify at 10/08/23 1616   Allergies: Allergies  Allergen Reactions   Wellbutrin [Bupropion]     insomnia   Tramadol Rash    Mainly in chest and neck area    REVIEW OF SYSTEMS:   Review of Systems  Constitutional:  Negative for chills, fatigue and fever.  HENT:   Negative for lump/mass, mouth sores, nosebleeds, sore throat and trouble swallowing.   Eyes:  Negative for eye problems.  Respiratory:  Positive for cough. Negative for shortness of breath.   Cardiovascular:  Negative for chest pain, leg swelling and palpitations.  Gastrointestinal:  Negative for abdominal pain, constipation, diarrhea, nausea and vomiting.  Genitourinary:  Negative for bladder incontinence, difficulty urinating, dysuria, frequency, hematuria and nocturia.   Musculoskeletal:  Negative for arthralgias, back pain, flank pain, myalgias and neck pain.  Skin:  Negative for itching and rash.  Neurological:  Positive for numbness. Negative for dizziness and headaches.  Hematological:  Does not bruise/bleed easily.  Psychiatric/Behavioral:  Negative for depression, sleep disturbance and suicidal ideas. The patient is not nervous/anxious.   All other systems reviewed and are negative.    VITALS:   Last menstrual period 04/19/2022.  Wt Readings from Last 3 Encounters:  10/08/23 159 lb 12.8 oz (72.5 kg)  09/25/23 157 lb 9.6 oz (71.5 kg)  09/10/23 156 lb 6.4 oz (70.9 kg)    There is no height or weight on file to calculate BMI.  Performance status (ECOG): 1 - Symptomatic but completely ambulatory  PHYSICAL EXAM:   Physical Exam Vitals and  nursing note reviewed. Exam conducted with a chaperone present.  Constitutional:      Appearance: Normal appearance.  Cardiovascular:     Rate and Rhythm: Normal rate and regular rhythm.  Pulses: Normal pulses.     Heart sounds: Normal heart sounds.  Pulmonary:     Effort: Pulmonary effort is normal.     Breath sounds: Normal breath sounds.  Abdominal:     Palpations: Abdomen is soft. There is no hepatomegaly, splenomegaly or mass.     Tenderness: There is no abdominal tenderness.  Musculoskeletal:     Right lower leg: No edema.     Left lower leg: No edema.  Lymphadenopathy:     Cervical: No cervical adenopathy.     Right cervical: No superficial, deep or posterior cervical adenopathy.    Left cervical: No superficial, deep or posterior cervical adenopathy.     Upper Body:     Right upper body: No supraclavicular or axillary adenopathy.     Left upper body: No supraclavicular or axillary adenopathy.  Neurological:     General: No focal deficit present.     Mental Status: She is alert and oriented to person, place, and time.  Psychiatric:        Mood and Affect: Mood normal.        Behavior: Behavior normal.     LABS:      Latest Ref Rng & Units 10/08/2023    9:11 AM 09/25/2023    8:24 AM 09/10/2023    9:22 AM  CBC  WBC 4.0 - 10.5 K/uL 4.4  3.1  4.8   Hemoglobin 12.0 - 15.0 g/dL 78.4  69.6  29.5   Hematocrit 36.0 - 46.0 % 37.3  36.6  39.6   Platelets 150 - 400 K/uL 242  338  217       Latest Ref Rng & Units 10/08/2023    9:11 AM 09/25/2023    8:24 AM 09/10/2023    9:22 AM  CMP  Glucose 70 - 99 mg/dL 86  284  132   BUN 6 - 20 mg/dL 12  6  8    Creatinine 0.44 - 1.00 mg/dL 4.40  1.02  7.25   Sodium 135 - 145 mmol/L 134  137  138   Potassium 3.5 - 5.1 mmol/L 3.8  3.8  3.8   Chloride 98 - 111 mmol/L 105  105  106   CO2 22 - 32 mmol/L 22  24  24    Calcium 8.9 - 10.3 mg/dL 9.0  9.2  9.2   Total Protein 6.5 - 8.1 g/dL 6.6  6.8  6.8   Total Bilirubin <1.2 mg/dL <3.6   0.3  0.1   Alkaline Phos 38 - 126 U/L 83  95  82   AST 15 - 41 U/L 13  19  17    ALT 0 - 44 U/L 10  31  21       Lab Results  Component Value Date   CEA1 11.6 (H) 09/25/2023   /  CEA  Date Value Ref Range Status  09/25/2023 11.6 (H) 0.0 - 4.7 ng/mL Final    Comment:    (NOTE)                             Nonsmokers          <3.9                             Smokers             <5.6 [employer] Electrochemiluminescence Immunoassay (ECLIA) Values obtained with different  assay methods or kits cannot be used interchangeably.  Results cannot be interpreted as absolute evidence of the presence or absence of malignant disease. Performed At: [facility] [address] [place], [place] [id number] Jolene Schimke MD WU:[id number]    No results found for: "PSA1" No results found for: "214 717 0150" No results found for: "CAN125"  No results found for: "TOTALPROTELP", "ALBUMINELP", "A1GS", "A2GS", "BETS", "BETA2SER", "GAMS", "MSPIKE", "SPEI" No results found for: "TIBC", "FERRITIN", "IRONPCTSAT" No results found for: "LDH"   STUDIES:   CT CHEST ABDOMEN PELVIS W CONTRAST  Result Date: 10/08/2023 CLINICAL DATA:  Metastatic rectal cancer restaging * Tracking Code: BO * EXAM: CT CHEST, ABDOMEN, AND PELVIS WITH CONTRAST TECHNIQUE: Multidetector CT imaging of the chest, abdomen and pelvis was performed following the standard protocol during bolus administration of intravenous contrast. RADIATION DOSE REDUCTION: This exam was performed according to the departmental dose-optimization program which includes automated exposure control, adjustment of the mA and/or kV according to patient size and/or use of iterative reconstruction technique. CONTRAST:  OMNIPAQUE IOHEXOL 300 MG/ML SOLN additional oral enteric contrast COMPARISON:  06/29/2023 FINDINGS: CT CHEST FINDINGS Cardiovascular: Right chest port catheter. Normal heart size. No pericardial effusion. Mediastinum/Nodes: No enlarged  mediastinal, hilar, or axillary lymph nodes. Thyroid gland, trachea, and esophagus demonstrate no significant findings. Lungs/Pleura: New consolidation of the inferior lingula with associated bronchiolar plugging and adjacent clustered centrilobular nodularity (series 3, image 103). Additional new small bandlike consolidation of the medial segment right middle lobe (series 3, image 88). A previously seen thick-walled cavitary nodule of the peripheral posterior right upper lobe is now very thin walled and not significantly changed in overall size when measured similarly, measuring 1.2 x 1.1 cm (series 3, image 77). Tiny 0.3 cm nodules in the anterior left upper lobe not significantly changed (series 3, image 57). No new nodules. No pleural effusion or pneumothorax. Musculoskeletal: No chest wall abnormality. No acute osseous findings. CT ABDOMEN PELVIS FINDINGS Hepatobiliary: Multiple hypodense liver lesions again seen, largest lesion in posterior hepatic segment II measures 3.5 x 3.0 cm, previously 3.5 x 3.2 cm (series 2, image 48), additional lesion in the posterior right lobe of the liver, hepatic segment VII measures 1.2 x 1.1 cm, previously 1.3 x 1.1 cm (series 2, image 48). Lesion in the posterior right lobe of the liver, hepatic segment VII measures 1.61.4 cm, previously 1.9 x 1.8 cm (series 2, image 57). Hyperdense lesion in the central right lobe of the liver measures 3.7 x 3.5 cm, previously 3.7 x 3.4 cm (series 2, image 55). Contracted gallbladder. No gallstones, gallbladder wall thickening, or biliary dilatation. Pancreas: Unremarkable. No pancreatic ductal dilatation or surrounding inflammatory changes. Spleen: Normal in size without significant abnormality. Benign granulomatous calcifications scattered throughout the spleen, requiring no further follow-up or characterization. Adrenals/Urinary Tract: Adrenal glands are unremarkable. Kidneys are normal, without renal calculi, solid lesion, or  hydronephrosis. Bladder is unremarkable. Stomach/Bowel: Stomach is within normal limits. Diverting loop ileostomy in the left hemiabdomen. Redemonstrated postoperative findings of low anterior resection with similar size of an air containing, thick-walled presacral fluid collection measuring 3.5 x 2.0 cm (series 2, image 108). Previously seen right eccentric soft tissue nodule adjacent to the rectum is resolved (series 2, image 107). Vascular/Lymphatic: No significant vascular findings are present. No enlarged abdominal or pelvic lymph nodes. Reproductive: Status post hysterectomy. Other: No abdominal wall hernia or abnormality. No ascites. Musculoskeletal: No acute osseous findings. IMPRESSION: 1. Multiple hypodense liver metastases, one of which is diminished in size, others  not significantly changed. 2. Unchanged hyperdense lesion of the central right lobe of the liver, which given very different imaging characteristics and previous characterization by MR likely reflects an incidentally present focal nodular hyperplasia or hepatic adenoma. Attention on follow-up. 3. A previously seen thick-walled cavitary nodule of the peripheral posterior right upper lobe is now very thin walled and not significantly changed in overall size when measured similarly. Uncertain whether this reflects treatment response of a cavitary metastatic nodule or resolution of nonspecific infection or inflammation. Continued attention on follow-up. Additional tiny nodules of the anterior left upper lobe unchanged. 4. New consolidation of the inferior lingula with associated bronchiolar plugging and adjacent clustered centrilobular nodularity. Additional new small bandlike consolidation of the medial segment right middle lobe. Distribution is very characteristic of atypical infection, particularly atypical mycobacterium. Metastatic disease not favored. 5. Redemonstrated postoperative findings of low anterior resection with similar size of an  air containing, thick-walled presacral fluid collection measuring 3.5 x 2.0 cm, most likely fistulized to adjacent rectum. 6. Previously seen right eccentric soft tissue nodule adjacent to the rectum is resolved. Uncertain whether this reflects resolution of a metastatic lymph node or peritoneal soft tissue nodule. Attention on follow-up. 7. Diverting loop ileostomy in the left hemiabdomen. Electronically Signed   By: Jearld Lesch M.D.   On: 10/08/2023 08:36

## 2023-10-09 ENCOUNTER — Other Ambulatory Visit: Payer: Self-pay

## 2023-10-10 ENCOUNTER — Ambulatory Visit: Payer: Self-pay | Admitting: General Surgery

## 2023-10-10 ENCOUNTER — Inpatient Hospital Stay: Payer: BC Managed Care – PPO

## 2023-10-10 VITALS — BP 107/68 | HR 91 | Temp 97.1°F | Resp 18

## 2023-10-10 DIAGNOSIS — C20 Malignant neoplasm of rectum: Secondary | ICD-10-CM

## 2023-10-10 DIAGNOSIS — Z95828 Presence of other vascular implants and grafts: Secondary | ICD-10-CM

## 2023-10-10 DIAGNOSIS — Z5111 Encounter for antineoplastic chemotherapy: Secondary | ICD-10-CM | POA: Diagnosis not present

## 2023-10-10 MED ORDER — HEPARIN SOD (PORK) LOCK FLUSH 100 UNIT/ML IV SOLN
500.0000 [IU] | Freq: Once | INTRAVENOUS | Status: AC | PRN
Start: 2023-10-10 — End: 2023-10-10
  Administered 2023-10-10: 500 [IU]

## 2023-10-10 MED ORDER — SODIUM CHLORIDE 0.9% FLUSH
10.0000 mL | INTRAVENOUS | Status: DC | PRN
  Administered 2023-10-10: 10 mL

## 2023-10-10 NOTE — Progress Notes (Signed)
  Patient presents today for pump d/c. Vital signs are stable. Port a cath site clean, dry, and intact. Port flushed with 10 mls of Normal Saline and 500 Units of Heparin. Needle removed intact. Band aid applied. Patient has no complaints at this time. Discharged from clinic ambulatory and in stable condition. Patient alert and oriented. All follow ups as scheduled.   Terri Mckinney [employer]

## 2023-10-11 ENCOUNTER — Other Ambulatory Visit: Payer: Self-pay

## 2023-10-22 ENCOUNTER — Inpatient Hospital Stay: Payer: BC Managed Care – PPO

## 2023-10-22 ENCOUNTER — Inpatient Hospital Stay: Payer: BC Managed Care – PPO | Admitting: Hematology

## 2023-10-24 ENCOUNTER — Inpatient Hospital Stay: Payer: BC Managed Care – PPO

## 2023-10-28 NOTE — Patient Instructions (Addendum)
SURGICAL WAITING ROOM VISITATION Patients having surgery or a procedure may have no more than 2 support people in the waiting area - these visitors may rotate in the visitor waiting room.   Due to an increase in RSV and influenza rates and associated hospitalizations, children ages 51 and under may not visit patients in [facility] hospitals. If the patient needs to stay at the hospital during part of their recovery, the visitor guidelines for inpatient rooms apply.  PRE-OP VISITATION  Pre-op nurse will coordinate an appropriate time for 1 support person to accompany the patient in pre-op.  This support person may not rotate.  This visitor will be contacted when the time is appropriate for the visitor to come back in the pre-op area.  Please refer to the [facility] website for the visitor guidelines for Inpatients (after your surgery is over and you are in a regular room).  You are not required to quarantine at this time prior to your surgery. However, you must do this: Hand Hygiene often Do NOT share personal items Notify your provider if you are in close contact with someone who has COVID or you develop fever 100.4 or greater, new onset of sneezing, cough, sore throat, shortness of breath or body aches.  If you test positive for Covid or have been in contact with anyone that has tested positive in the last 10 days please notify you surgeon.    Your procedure is scheduled on:  [facility]  November 07, 2023  Report to [facility] Main Entrance: Leota Jacobsen entrance where the [employer] is available.   Report to admitting at: 12:45 PM  Call this number if you have any questions or problems the morning of surgery [phone]  FOLLOW ANY ADDITIONAL PRE OP INSTRUCTIONS YOU RECEIVED FROM YOUR SURGEON'S OFFICE!!!  Dulcolax 20 mg (total) - Take 4 (four) of the 5 mg Dulcolax tablets with water at 07:00 am the day prior to surgery.  Miralax 255 g - Mix with 64 oz Gatorade/Powerade.   Starting at 10:00 am ,Drink this gradually over the next few hours (8 oz glass every 15-30 minutes) until gone the day prior to surgery You should finish in 4 hours-6 hours.    Drink plenty of clear liquids all evening to avoid getting dehydrated.   DRINK two (2) bottles of Pre-Surgery Clear Ensure drink starting at 6:00 pm the evening prior to your surgery to help prevent dehydration. Increase drinking clear fluids (see list below)          Do not eat food after Midnight the night prior to your surgery/procedure.  After Midnight you may have the following liquids until 12:00 Noon the DAY OF SURGERY  Clear Liquid Diet Water Black Coffee (sugar ok, NO MILK/CREAM OR CREAMERS)  Tea (sugar ok, NO MILK/CREAM OR CREAMERS) regular and decaf                             Plain Jell-O  with no fruit (NO RED)                                           Fruit ices (not with fruit pulp, NO RED)  Popsicles (NO RED)                                                                  Juice: NO CITRUS JUICES: only apple, WHITE grape, WHITE cranberry Sports drinks like Gatorade or Powerade (NO RED)                   The day of surgery:  Drink ONE (1) Pre-Surgery Clear Ensure at 12:00 Noon the Day of surgery. Drink in one sitting. Do not sip.  This drink was given to you during your hospital pre-op appointment visit. Nothing else to drink after completing the Pre-Surgery Clear Ensure  : No candy, chewing gum or throat lozenges.      Oral Hygiene is also important to reduce your risk of infection.        Remember - BRUSH YOUR TEETH THE MORNING OF SURGERY WITH YOUR REGULAR TOOTHPASTE  Do NOT smoke after Midnight the night before surgery.  STOP TAKING all Vitamins, Herbs and supplements 1 week before your surgery.   Take ONLY these medicines the morning of surgery with A SIP OF WATER: None                    You may not have any metal on your body including hair pins,  jewelry, and body piercing  Do not wear make-up, lotions, powders, perfumes  or deodorant  Do not wear nail polish including gel and S&S, artificial / acrylic nails, or any other type of covering on natural nails including finger and toenails. If you have artificial nails, gel coating, etc., that needs to be removed by a nail salon, Please have this removed prior to surgery. Not doing so may mean that your surgery could be cancelled or delayed if the Surgeon or anesthesia staff feels like they are unable to monitor you safely.   Do not shave 48 hours prior to surgery to avoid nicks in your skin which may contribute to postoperative infections.   Contacts, Hearing Aids, dentures or bridgework may not be worn into surgery. DENTURES WILL BE REMOVED PRIOR TO SURGERY PLEASE DO NOT APPLY "Poly grip" OR ADHESIVES!!!  You may bring a small overnight bag with you on the day of surgery, only pack items that are not valuable. [facility] IS NOT RESPONSIBLE   FOR VALUABLES THAT ARE LOST OR STOLEN.   Do not bring your home medications to the hospital. The Pharmacy will dispense medications listed on your medication list to you during your admission in the Hospital.  Please read over the following fact sheets you were given: IF YOU HAVE QUESTIONS ABOUT YOUR PRE-OP INSTRUCTIONS, PLEASE CALL [phone]   [facility] - Preparing for Surgery Before surgery, you can play an important role.  Because skin is not sterile, your skin needs to be as free of germs as possible.  You can reduce the number of germs on your skin by washing with CHG (chlorahexidine gluconate) soap before surgery.  CHG is an antiseptic cleaner which kills germs and bonds with the skin to continue killing germs even after washing. Please DO NOT use if you have an allergy to CHG or antibacterial soaps.  If your skin becomes reddened/irritated stop using the CHG and inform  your nurse when you arrive at Short Stay. Do not shave (including legs  and underarms) for at least 48 hours prior to the first CHG shower.  You may shave your face/neck.  Please follow these instructions carefully:  1.  Shower with CHG Soap the night before surgery and the  morning of surgery.  2.  If you choose to wash your hair, wash your hair first as usual with your normal  shampoo.  3.  After you shampoo, rinse your hair and body thoroughly to remove the shampoo.                             4.  Use CHG as you would any other liquid soap.  You can apply chg directly to the skin and wash.  Gently with a scrungie or clean washcloth.  5.  Apply the CHG Soap to your body ONLY FROM THE NECK DOWN.   Do not use on face/ open                           Wound or open sores. Avoid contact with eyes, ears mouth and genitals (private parts).                       Wash face,  Genitals (private parts) with your normal soap.             6.  Wash thoroughly, paying special attention to the area where your  surgery  will be performed.  7.  Thoroughly rinse your body with warm water from the neck down.  8.  DO NOT shower/wash with your normal soap after using and rinsing off the CHG Soap.            9.  Pat yourself dry with a clean towel.            10.  Wear clean pajamas.            11.  Place clean sheets on your bed the night of your first shower and do not  sleep with pets.  ON THE DAY OF SURGERY : Do not apply any lotions/deodorants the morning of surgery.  Please wear clean clothes to the hospital/surgery center.     FAILURE TO FOLLOW THESE INSTRUCTIONS MAY RESULT IN THE CANCELLATION OF YOUR SURGERY  PATIENT SIGNATURE_________________________________  NURSE SIGNATURE__________________________________  ________________________________________________________________________    Terri Mckinney    An incentive spirometer is a tool that can help keep your lungs clear and active. This tool measures how well you are filling your lungs with each breath.  Taking long deep breaths may help reverse or decrease the chance of developing breathing (pulmonary) problems (especially infection) following: A long period of time when you are unable to move or be active. BEFORE THE PROCEDURE  If the spirometer includes an indicator to show your best effort, your nurse or respiratory therapist will set it to a desired goal. If possible, sit up straight or lean slightly forward. Try not to slouch. Hold the incentive spirometer in an upright position. INSTRUCTIONS FOR USE  Sit on the edge of your bed if possible, or sit up as far as you can in bed or on a chair. Hold the incentive spirometer in an upright position. Breathe out normally. Place the mouthpiece in your mouth and seal your lips tightly around it. Breathe in slowly and as  deeply as possible, raising the piston or the ball toward the top of the column. Hold your breath for 3-5 seconds or for as long as possible. Allow the piston or ball to fall to the bottom of the column. Remove the mouthpiece from your mouth and breathe out normally. Rest for a few seconds and repeat Steps 1 through 7 at least 10 times every 1-2 hours when you are awake. Take your time and take a few normal breaths between deep breaths. The spirometer may include an indicator to show your best effort. Use the indicator as a goal to work toward during each repetition. After each set of 10 deep breaths, practice coughing to be sure your lungs are clear. If you have an incision (the cut made at the time of surgery), support your incision when coughing by placing a pillow or rolled up towels firmly against it. Once you are able to get out of bed, walk around indoors and cough well. You may stop using the incentive spirometer when instructed by your caregiver.  RISKS AND COMPLICATIONS Take your time so you do not get dizzy or light-headed. If you are in pain, you may need to take or ask for pain medication before doing incentive  spirometry. It is harder to take a deep breath if you are having pain. AFTER USE Rest and breathe slowly and easily. It can be helpful to keep track of a log of your progress. Your caregiver can provide you with a simple table to help with this. If you are using the spirometer at home, follow these instructions: SEEK MEDICAL CARE IF:  You are having difficultly using the spirometer. You have trouble using the spirometer as often as instructed. Your pain medication is not giving enough relief while using the spirometer. You develop fever of 100.5 F (38.1 C) or higher.                                                                                                    SEEK IMMEDIATE MEDICAL CARE IF:  You cough up bloody sputum that had not been present before. You develop fever of 102 F (38.9 C) or greater. You develop worsening pain at or near the incision site. MAKE SURE YOU:  Understand these instructions. Will watch your condition. Will get help right away if you are not doing well or get worse. Document Released: 04/02/2007 Document Revised: 02/12/2012 Document Reviewed: 06/03/2007 [facility] Patient Information 2014 [place], [place].

## 2023-10-28 NOTE — Progress Notes (Signed)
COVID Vaccine received:  [x]  No []  Yes Date of any COVID positive Test in last 90 days:  none  PCP - Jannifer Rodney, FNP at [facility]  [phone] (Work) [phone] (Fax)  Cardiologist - none Oncology- Doreatha Massed, MD at [facility].   Chest x-ray -  EKG -  no hx to warrant Stress Test -  ECHO -  Cardiac Cath -   PCR screen: []  Ordered & Completed [x]   No Order but Needs PROFEND     []   N/A for this surgery  Surgery Plan:  []  Ambulatory   []  Outpatient in bed  [x]  Admit Anesthesia:    [x]  General  []  Spinal  []   Choice []   MAC  Bowel Prep - []  No  [x]   Yes _Miralax, Dulcolax__  Pacemaker / ICD device [x]  No []  Yes   Spinal Cord Stimulator:[x]  No []  Yes       History of Sleep Apnea? [x]  No []  Yes   CPAP used?- [x]  No []  Yes    Does the patient monitor blood sugar?   [x]  N/A   []  No []  Yes  Patient has: [x]  NO Hx DM   []  Pre-DM   []  DM1  []   DM2  Blood Thinner / Instructions: none Aspirin Instructions:  none  ERAS Protocol Ordered: []  No  [x]  Yes PRE-SURGERY [x]  ENSURE x3  []  G2   Patient is to be NPO after:   Dental hx: []  Dentures:  [x]  N/A      []  Bridge or Partial:                   []  Loose or Damaged teeth:   Comments: Labs done 10-08-23 are from [facility].   Activity level: Patient is able climb a flight of stairs without difficulty; [x]  No CP  [x]  No SOB.  Patient can perform ADLs without assistance.   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 Current chemo tx  Patient denies shortness of breath, fever, cough and chest pain at PAT appointment.  Patient verbalized understanding and agreement to the Pre-Surgical Instructions that were given to them at this PAT appointment. Patient was also educated of the need to review these PAT instructions again prior to her surgery.I reviewed the appropriate phone numbers to call if they have any and questions or concerns.

## 2023-10-29 ENCOUNTER — Encounter (HOSPITAL_COMMUNITY)
Admission: RE | Admit: 2023-10-29 | Discharge: 2023-10-29 | Disposition: A | Discharge status: Home / Self Care | Payer: BC Managed Care – PPO | Source: Ambulatory Visit | Attending: General Surgery | Admitting: General Surgery

## 2023-10-29 ENCOUNTER — Encounter (HOSPITAL_COMMUNITY): Payer: Self-pay

## 2023-10-29 ENCOUNTER — Other Ambulatory Visit: Payer: Self-pay

## 2023-10-29 VITALS — BP 109/62 | Temp 98.8°F | Resp 16 | Ht 68.0 in | Wt 156.0 lb

## 2023-10-29 DIAGNOSIS — C787 Secondary malignant neoplasm of liver and intrahepatic bile duct: Secondary | ICD-10-CM | POA: Insufficient documentation

## 2023-10-29 DIAGNOSIS — Z01818 Encounter for other preprocedural examination: Secondary | ICD-10-CM

## 2023-10-29 DIAGNOSIS — Z01812 Encounter for preprocedural laboratory examination: Secondary | ICD-10-CM | POA: Diagnosis present

## 2023-10-29 DIAGNOSIS — C20 Malignant neoplasm of rectum: Secondary | ICD-10-CM | POA: Diagnosis not present

## 2023-10-29 LAB — CBC
HCT: 38.7 % (ref 36.0–46.0)
Hemoglobin: 12.8 g/dL (ref 12.0–15.0)
MCH: 33.5 pg (ref 26.0–34.0)
MCHC: 33.1 g/dL (ref 30.0–36.0)
MCV: 101.3 fL — ABNORMAL HIGH (ref 80.0–100.0)
Platelets: 255 10*3/uL (ref 150–400)
RBC: 3.82 MIL/uL — ABNORMAL LOW (ref 3.87–5.11)
RDW: 17.5 % — ABNORMAL HIGH (ref 11.5–15.5)
WBC: 5.2 10*3/uL (ref 4.0–10.5)
nRBC: 0 % (ref 0.0–0.2)

## 2023-10-29 LAB — COMPREHENSIVE METABOLIC PANEL
ALT: 22 U/L (ref 0–44)
AST: 17 U/L (ref 15–41)
Albumin: 3.9 g/dL (ref 3.5–5.0)
Alkaline Phosphatase: 80 U/L (ref 38–126)
Anion gap: 6 (ref 5–15)
BUN: 14 mg/dL (ref 6–20)
CO2: 24 mmol/L (ref 22–32)
Calcium: 9.2 mg/dL (ref 8.9–10.3)
Chloride: 106 mmol/L (ref 98–111)
Creatinine, Ser: 0.91 mg/dL (ref 0.44–1.00)
GFR, Estimated: >60 mL/min (ref >60)
Glucose, Bld: 86 mg/dL (ref 70–99)
Potassium: 4.3 mmol/L (ref 3.5–5.1)
Sodium: 136 mmol/L (ref 135–145)
Total Bilirubin: 0.5 mg/dL (ref <1.2)
Total Protein: 7 g/dL (ref 6.5–8.1)

## 2023-11-06 ENCOUNTER — Inpatient Hospital Stay: Payer: BC Managed Care – PPO

## 2023-11-06 ENCOUNTER — Inpatient Hospital Stay: Payer: BC Managed Care – PPO | Admitting: Hematology

## 2023-11-07 ENCOUNTER — Encounter (HOSPITAL_COMMUNITY): Payer: Self-pay | Admitting: General Surgery

## 2023-11-07 ENCOUNTER — Inpatient Hospital Stay (HOSPITAL_COMMUNITY): Payer: BC Managed Care – PPO

## 2023-11-07 ENCOUNTER — Other Ambulatory Visit: Payer: Self-pay

## 2023-11-07 ENCOUNTER — Encounter (HOSPITAL_COMMUNITY)
Admission: RE | Disposition: A | Discharge status: Home / Self Care | Payer: Self-pay | Source: Home / Self Care | Attending: General Surgery

## 2023-11-07 ENCOUNTER — Inpatient Hospital Stay (HOSPITAL_COMMUNITY)
Admission: RE | Admit: 2023-11-07 | Discharge: 2023-11-10 | DRG: 331 | Disposition: A | Discharge status: Home / Self Care | Payer: BC Managed Care – PPO | Attending: General Surgery | Admitting: General Surgery

## 2023-11-07 DIAGNOSIS — Z806 Family history of leukemia: Secondary | ICD-10-CM

## 2023-11-07 DIAGNOSIS — Z8505 Personal history of malignant neoplasm of liver: Secondary | ICD-10-CM

## 2023-11-07 DIAGNOSIS — Z801 Family history of malignant neoplasm of trachea, bronchus and lung: Secondary | ICD-10-CM

## 2023-11-07 DIAGNOSIS — Z83438 Family history of other disorder of lipoprotein metabolism and other lipidemia: Secondary | ICD-10-CM | POA: Diagnosis not present

## 2023-11-07 DIAGNOSIS — Z8261 Family history of arthritis: Secondary | ICD-10-CM | POA: Diagnosis not present

## 2023-11-07 DIAGNOSIS — Z888 Allergy status to other drugs, medicaments and biological substances status: Secondary | ICD-10-CM

## 2023-11-07 DIAGNOSIS — Z833 Family history of diabetes mellitus: Secondary | ICD-10-CM

## 2023-11-07 DIAGNOSIS — F17210 Nicotine dependence, cigarettes, uncomplicated: Secondary | ICD-10-CM | POA: Diagnosis present

## 2023-11-07 DIAGNOSIS — Z885 Allergy status to narcotic agent status: Secondary | ICD-10-CM | POA: Diagnosis not present

## 2023-11-07 DIAGNOSIS — Z79899 Other long term (current) drug therapy: Secondary | ICD-10-CM | POA: Diagnosis not present

## 2023-11-07 DIAGNOSIS — Z932 Ileostomy status: Principal | ICD-10-CM

## 2023-11-07 DIAGNOSIS — Z90710 Acquired absence of both cervix and uterus: Secondary | ICD-10-CM

## 2023-11-07 DIAGNOSIS — Z823 Family history of stroke: Secondary | ICD-10-CM | POA: Diagnosis not present

## 2023-11-07 DIAGNOSIS — Z9221 Personal history of antineoplastic chemotherapy: Secondary | ICD-10-CM

## 2023-11-07 DIAGNOSIS — Z822 Family history of deafness and hearing loss: Secondary | ICD-10-CM | POA: Diagnosis not present

## 2023-11-07 DIAGNOSIS — Z432 Encounter for attention to ileostomy: Secondary | ICD-10-CM | POA: Diagnosis present

## 2023-11-07 DIAGNOSIS — Z800 Family history of malignant neoplasm of digestive organs: Secondary | ICD-10-CM

## 2023-11-07 DIAGNOSIS — Z85048 Personal history of other malignant neoplasm of rectum, rectosigmoid junction, and anus: Secondary | ICD-10-CM | POA: Diagnosis not present

## 2023-11-07 DIAGNOSIS — Z803 Family history of malignant neoplasm of breast: Secondary | ICD-10-CM | POA: Diagnosis not present

## 2023-11-07 DIAGNOSIS — Z8249 Family history of ischemic heart disease and other diseases of the circulatory system: Secondary | ICD-10-CM

## 2023-11-07 HISTORY — PX: ILEOSTOMY CLOSURE: SHX1784

## 2023-11-07 LAB — TYPE AND SCREEN
ABO/RH(D): A POS
Antibody Screen: NEGATIVE

## 2023-11-07 SURGERY — CLOSURE, ILEOSTOMY
Anesthesia: General

## 2023-11-07 MED ORDER — ACETAMINOPHEN 500 MG PO TABS
1000.0000 mg | ORAL_TABLET | ORAL | Status: AC
  Administered 2023-11-07: 1000 mg via ORAL
  Filled 2023-11-07: qty 2

## 2023-11-07 MED ORDER — GABAPENTIN 300 MG PO CAPS
300.0000 mg | ORAL_CAPSULE | ORAL | Status: AC
  Administered 2023-11-07: 300 mg via ORAL
  Filled 2023-11-07: qty 1

## 2023-11-07 MED ORDER — FENTANYL CITRATE PF 50 MCG/ML IJ SOSY
PREFILLED_SYRINGE | INTRAMUSCULAR | Status: AC
  Administered 2023-11-07: 50 ug via INTRAVENOUS
  Filled 2023-11-07: qty 3

## 2023-11-07 MED ORDER — MIDAZOLAM HCL 2 MG/2ML IJ SOLN
INTRAMUSCULAR | Status: AC
Start: 2023-11-07 — End: ?
  Filled 2023-11-07: qty 2

## 2023-11-07 MED ORDER — ALVIMOPAN 12 MG PO CAPS
12.0000 mg | ORAL_CAPSULE | ORAL | Status: AC
  Administered 2023-11-07: 12 mg via ORAL
  Filled 2023-11-07: qty 1

## 2023-11-07 MED ORDER — ENSURE PRE-SURGERY PO LIQD: 296.0000 mL | Freq: Once | ORAL | Status: DC

## 2023-11-07 MED ORDER — ONDANSETRON HCL 4 MG/2ML IJ SOLN
INTRAMUSCULAR | Status: DC | PRN
  Administered 2023-11-07: 4 mg via INTRAVENOUS

## 2023-11-07 MED ORDER — BUPIVACAINE LIPOSOME 1.3 % IJ SUSP
INTRAMUSCULAR | Status: AC
  Filled 2023-11-07: qty 20

## 2023-11-07 MED ORDER — LIDOCAINE HCL (CARDIAC) PF 100 MG/5ML IV SOSY
PREFILLED_SYRINGE | INTRAVENOUS | Status: DC | PRN
  Administered 2023-11-07: 50 mg via INTRAVENOUS

## 2023-11-07 MED ORDER — ALVIMOPAN 12 MG PO CAPS
12.0000 mg | ORAL_CAPSULE | Freq: Two times a day (BID) | ORAL | Status: DC
  Administered 2023-11-08 – 2023-11-10 (×5): 12 mg via ORAL
  Filled 2023-11-07 (×5): qty 1

## 2023-11-07 MED ORDER — FENTANYL CITRATE (PF) 100 MCG/2ML IJ SOLN
INTRAMUSCULAR | Status: AC
  Filled 2023-11-07: qty 2

## 2023-11-07 MED ORDER — DEXAMETHASONE SODIUM PHOSPHATE 10 MG/ML IJ SOLN
INTRAMUSCULAR | Status: AC
  Filled 2023-11-07: qty 1

## 2023-11-07 MED ORDER — SODIUM CHLORIDE 0.9 % IV SOLN
2.0000 g | Freq: Two times a day (BID) | INTRAVENOUS | Status: AC
  Administered 2023-11-07: 2 g via INTRAVENOUS
  Filled 2023-11-07: qty 2

## 2023-11-07 MED ORDER — HYDROMORPHONE HCL 1 MG/ML IJ SOLN: 0.5000 mg | INTRAMUSCULAR | Status: DC | PRN

## 2023-11-07 MED ORDER — SODIUM CHLORIDE 0.9 % IV SOLN
2.0000 g | INTRAVENOUS | Status: AC
  Administered 2023-11-07: 2 g via INTRAVENOUS
  Filled 2023-11-07: qty 2

## 2023-11-07 MED ORDER — BUPIVACAINE-EPINEPHRINE (PF) 0.5% -1:200000 IJ SOLN
INTRAMUSCULAR | Status: DC | PRN
  Administered 2023-11-07: 50 mL

## 2023-11-07 MED ORDER — FENTANYL CITRATE (PF) 100 MCG/2ML IJ SOLN
INTRAMUSCULAR | Status: DC | PRN
  Administered 2023-11-07: 100 ug via INTRAVENOUS
  Administered 2023-11-07: 50 ug via INTRAVENOUS

## 2023-11-07 MED ORDER — KETAMINE HCL 10 MG/ML IJ SOLN
INTRAMUSCULAR | Status: DC | PRN
  Administered 2023-11-07: 30 mg via INTRAVENOUS

## 2023-11-07 MED ORDER — ENOXAPARIN SODIUM 40 MG/0.4ML IJ SOSY
40.0000 mg | PREFILLED_SYRINGE | INTRAMUSCULAR | Status: DC
Start: 2023-11-08 — End: 2023-11-10
  Administered 2023-11-08 – 2023-11-09 (×2): 40 mg via SUBCUTANEOUS
  Filled 2023-11-07 (×2): qty 0.4

## 2023-11-07 MED ORDER — BUPIVACAINE LIPOSOME 1.3 % IJ SUSP: 20.0000 mL | Freq: Once | INTRAMUSCULAR | Status: DC

## 2023-11-07 MED ORDER — PROPOFOL 10 MG/ML IV BOLUS
INTRAVENOUS | Status: AC
  Filled 2023-11-07: qty 20

## 2023-11-07 MED ORDER — ENSURE PRE-SURGERY PO LIQD: 592.0000 mL | Freq: Once | ORAL | Status: DC

## 2023-11-07 MED ORDER — SUGAMMADEX SODIUM 200 MG/2ML IV SOLN
INTRAVENOUS | Status: DC | PRN
  Administered 2023-11-07: 150 mg via INTRAVENOUS

## 2023-11-07 MED ORDER — FENTANYL CITRATE PF 50 MCG/ML IJ SOSY
25.0000 ug | PREFILLED_SYRINGE | INTRAMUSCULAR | Status: DC | PRN
  Administered 2023-11-07 (×2): 50 ug via INTRAVENOUS

## 2023-11-07 MED ORDER — LIDOCAINE HCL (PF) 2 % IJ SOLN
INTRAMUSCULAR | Status: AC
  Filled 2023-11-07: qty 5

## 2023-11-07 MED ORDER — SACCHAROMYCES BOULARDII 250 MG PO CAPS
250.0000 mg | ORAL_CAPSULE | Freq: Two times a day (BID) | ORAL | Status: DC
Start: 2023-11-07 — End: 2023-11-10
  Administered 2023-11-07 – 2023-11-10 (×6): 250 mg via ORAL
  Filled 2023-11-07 (×6): qty 1

## 2023-11-07 MED ORDER — DIPHENHYDRAMINE HCL 12.5 MG/5ML PO ELIX: 12.5000 mg | ORAL_SOLUTION | Freq: Four times a day (QID) | ORAL | Status: DC | PRN

## 2023-11-07 MED ORDER — CHLORHEXIDINE GLUCONATE 0.12 % MT SOLN
15.0000 mL | Freq: Once | OROMUCOSAL | Status: AC
  Administered 2023-11-07: 15 mL via OROMUCOSAL

## 2023-11-07 MED ORDER — AMISULPRIDE (ANTIEMETIC) 5 MG/2ML IV SOLN: 10.0000 mg | Freq: Once | INTRAVENOUS | Status: DC | PRN

## 2023-11-07 MED ORDER — SIMETHICONE 80 MG PO CHEW
40.0000 mg | CHEWABLE_TABLET | Freq: Four times a day (QID) | ORAL | Status: DC | PRN
  Administered 2023-11-07 – 2023-11-08 (×2): 40 mg via ORAL
  Filled 2023-11-07 (×3): qty 1

## 2023-11-07 MED ORDER — KCL IN DEXTROSE-NACL 20-5-0.45 MEQ/L-%-% IV SOLN
INTRAVENOUS | Status: AC
Start: 2023-11-07 — End: 2023-11-08
  Filled 2023-11-07: qty 1000

## 2023-11-07 MED ORDER — ACETAMINOPHEN 500 MG PO TABS
1000.0000 mg | ORAL_TABLET | Freq: Four times a day (QID) | ORAL | Status: DC
  Administered 2023-11-07 – 2023-11-10 (×9): 1000 mg via ORAL
  Filled 2023-11-07 (×11): qty 2

## 2023-11-07 MED ORDER — ENSURE SURGERY PO LIQD
237.0000 mL | Freq: Two times a day (BID) | ORAL | Status: DC
  Administered 2023-11-08 – 2023-11-09 (×3): 237 mL via ORAL

## 2023-11-07 MED ORDER — KETAMINE HCL 50 MG/5ML IJ SOSY
PREFILLED_SYRINGE | INTRAMUSCULAR | Status: AC
  Filled 2023-11-07: qty 5

## 2023-11-07 MED ORDER — DEXAMETHASONE SODIUM PHOSPHATE 10 MG/ML IJ SOLN
INTRAMUSCULAR | Status: DC | PRN
  Administered 2023-11-07: 10 mg via INTRAVENOUS

## 2023-11-07 MED ORDER — POLYETHYLENE GLYCOL 3350 17 GM/SCOOP PO POWD: 1.0000 | Freq: Once | ORAL | Status: DC

## 2023-11-07 MED ORDER — ORAL CARE MOUTH RINSE: 15.0000 mL | Freq: Once | OROMUCOSAL | Status: AC

## 2023-11-07 MED ORDER — LACTATED RINGERS IV SOLN: INTRAVENOUS | Status: DC

## 2023-11-07 MED ORDER — BISACODYL 5 MG PO TBEC: 20.0000 mg | DELAYED_RELEASE_TABLET | Freq: Once | ORAL | Status: DC

## 2023-11-07 MED ORDER — ONDANSETRON HCL 4 MG PO TABS: 4.0000 mg | ORAL_TABLET | Freq: Four times a day (QID) | ORAL | Status: DC | PRN

## 2023-11-07 MED ORDER — 0.9 % SODIUM CHLORIDE (POUR BTL) OPTIME
TOPICAL | Status: DC | PRN
  Administered 2023-11-07: 1000 mL

## 2023-11-07 MED ORDER — DEXMEDETOMIDINE HCL IN NACL 80 MCG/20ML IV SOLN
INTRAVENOUS | Status: DC | PRN
  Administered 2023-11-07: 4 ug via INTRAVENOUS
  Administered 2023-11-07: 8 ug via INTRAVENOUS

## 2023-11-07 MED ORDER — PROPOFOL 10 MG/ML IV BOLUS
INTRAVENOUS | Status: DC | PRN
  Administered 2023-11-07: 200 mg via INTRAVENOUS

## 2023-11-07 MED ORDER — ROCURONIUM BROMIDE 100 MG/10ML IV SOLN
INTRAVENOUS | Status: DC | PRN
  Administered 2023-11-07: 50 mg via INTRAVENOUS

## 2023-11-07 MED ORDER — ROCURONIUM BROMIDE 10 MG/ML (PF) SYRINGE
PREFILLED_SYRINGE | INTRAVENOUS | Status: AC
Start: 2023-11-07 — End: ?
  Filled 2023-11-07: qty 10

## 2023-11-07 MED ORDER — MIDAZOLAM HCL 5 MG/5ML IJ SOLN
INTRAMUSCULAR | Status: DC | PRN
  Administered 2023-11-07: 2 mg via INTRAVENOUS

## 2023-11-07 MED ORDER — DIPHENHYDRAMINE HCL 50 MG/ML IJ SOLN: 12.5000 mg | Freq: Four times a day (QID) | INTRAMUSCULAR | Status: DC | PRN

## 2023-11-07 MED ORDER — BUPIVACAINE-EPINEPHRINE (PF) 0.5% -1:200000 IJ SOLN
INTRAMUSCULAR | Status: AC
  Filled 2023-11-07: qty 30

## 2023-11-07 MED ORDER — ONDANSETRON HCL 4 MG/2ML IJ SOLN
INTRAMUSCULAR | Status: AC
  Filled 2023-11-07: qty 2

## 2023-11-07 MED ORDER — ONDANSETRON HCL 4 MG/2ML IJ SOLN: 4.0000 mg | Freq: Four times a day (QID) | INTRAMUSCULAR | Status: DC | PRN

## 2023-11-07 MED ORDER — ALUM & MAG HYDROXIDE-SIMETH 200-200-20 MG/5ML PO SUSP: 30.0000 mL | Freq: Four times a day (QID) | ORAL | Status: DC | PRN

## 2023-11-07 MED ORDER — GABAPENTIN 100 MG PO CAPS
300.0000 mg | ORAL_CAPSULE | Freq: Two times a day (BID) | ORAL | Status: DC
Start: 2023-11-07 — End: 2023-11-10
  Administered 2023-11-07 – 2023-11-10 (×6): 300 mg via ORAL
  Filled 2023-11-07 (×6): qty 3

## 2023-11-07 SURGICAL SUPPLY — 44 items
BAG COUNTER SPONGE SURGICOUNT (BAG) IMPLANT
BLADE HEX COATED 2.75 (ELECTRODE) ×1 IMPLANT
CHLORAPREP W/TINT 26 (MISCELLANEOUS) ×1 IMPLANT
COVER MAYO STAND STRL (DRAPES) ×1 IMPLANT
DRAPE LAPAROSCOPIC ABDOMINAL (DRAPES) ×1 IMPLANT
DRAPE UTILITY XL STRL (DRAPES) IMPLANT
DRAPE WARM FLUID 44X44 (DRAPES) ×1 IMPLANT
DRSG OPSITE POSTOP 4X10 (GAUZE/BANDAGES/DRESSINGS) IMPLANT
DRSG OPSITE POSTOP 4X6 (GAUZE/BANDAGES/DRESSINGS) IMPLANT
DRSG OPSITE POSTOP 4X8 (GAUZE/BANDAGES/DRESSINGS) IMPLANT
DRSG TELFA 3X8 NADH STRL (GAUZE/BANDAGES/DRESSINGS) IMPLANT
DRSG TELFA 4X8 ISLAND (GAUZE/BANDAGES/DRESSINGS) IMPLANT
ELECT REM PT RETURN 15FT ADLT (MISCELLANEOUS) ×1 IMPLANT
GAUZE SPONGE 4X4 12PLY STRL (GAUZE/BANDAGES/DRESSINGS) ×1 IMPLANT
GLOVE BIO SURGEON STRL SZ 6.5 (GLOVE) ×2 IMPLANT
GLOVE INDICATOR 6.5 STRL GRN (GLOVE) ×2 IMPLANT
GOWN STRL REUS W/ TWL XL LVL3 (GOWN DISPOSABLE) ×2 IMPLANT
HANDLE SUCTION POOLE (INSTRUMENTS) ×1 IMPLANT
HOLDER FOLEY CATH W/STRAP (MISCELLANEOUS) IMPLANT
KIT BASIN OR (CUSTOM PROCEDURE TRAY) ×1 IMPLANT
KIT TURNOVER KIT A (KITS) IMPLANT
MANIFOLD NEPTUNE II (INSTRUMENTS) ×1 IMPLANT
PACK GENERAL/GYN (CUSTOM PROCEDURE TRAY) ×1 IMPLANT
RELOAD PROXIMATE 75MM BLUE (ENDOMECHANICALS) ×2
RELOAD PROXIMATE TA60MM BLUE (ENDOMECHANICALS) ×1
RELOAD STAPLE 60 BLU REG PROX (ENDOMECHANICALS) IMPLANT
RELOAD STAPLE 75 3.8 BLU REG (ENDOMECHANICALS) IMPLANT
STAPLER GUN LINEAR PROX 60 (STAPLE) IMPLANT
STAPLER PROXIMATE 75MM BLUE (STAPLE) IMPLANT
SUCTION POOLE HANDLE (INSTRUMENTS) ×1
SUT NOVA NAB DX-16 0-1 5-0 T12 (SUTURE) ×1 IMPLANT
SUT NOVA NAB GS-21 0 18 T12 DT (SUTURE) IMPLANT
SUT NOVA NAB GS-21 1 T12 (SUTURE) IMPLANT
SUT PROLENE 2 0 BLUE (SUTURE) IMPLANT
SUT SILK 2 0 SH CR/8 (SUTURE) ×1 IMPLANT
SUT SILK 2-0 18XBRD TIE 12 (SUTURE) ×1 IMPLANT
SUT SILK 3 0 SH CR/8 (SUTURE) ×1 IMPLANT
SUT SILK 3-0 18XBRD TIE 12 (SUTURE) ×1 IMPLANT
SUT VIC AB 2-0 SH 18 (SUTURE) IMPLANT
SUT VIC AB 2-0 SH 27X BRD (SUTURE) ×2 IMPLANT
SUT VIC AB 4-0 PS2 18 (SUTURE) ×1 IMPLANT
TOWEL OR 17X26 10 PK STRL BLUE (TOWEL DISPOSABLE) ×2 IMPLANT
TOWEL OR NON WOVEN STRL DISP B (DISPOSABLE) ×2 IMPLANT
YANKAUER SUCT BULB TIP NO VENT (SUCTIONS) ×1 IMPLANT

## 2023-11-07 NOTE — Anesthesia Postprocedure Evaluation (Signed)
Anesthesia Post Note  Patient: Terri Mckinney  Procedure(s) Performed: DIVERTING LOOP ILEOSTOMY TAKEDOWN     Patient location during evaluation: PACU Anesthesia Type: General Level of consciousness: awake and alert Pain management: pain level controlled Vital Signs Assessment: post-procedure vital signs reviewed and stable Respiratory status: spontaneous breathing, nonlabored ventilation, respiratory function stable and patient connected to nasal cannula oxygen Cardiovascular status: blood pressure returned to baseline and stable Postop Assessment: no apparent nausea or vomiting Anesthetic complications: no  No notable events documented.  Last Vitals:  Vitals:   11/07/23 1700 11/07/23 1739  BP: 135/87 (!) 131/91  Pulse: 79 76  Resp: 17 16  Temp: 36.6 C   SpO2: 100% 100%    Last Pain:  Vitals:   11/07/23 1728  TempSrc:   PainSc: Asleep                 Kennieth Rad

## 2023-11-07 NOTE — Transfer of Care (Signed)
Immediate Anesthesia Transfer of Care Note  Patient: Terri Mckinney  Procedure(s) Performed: DIVERTING LOOP ILEOSTOMY TAKEDOWN  Patient Location: PACU  Anesthesia Type:General  Level of Consciousness: awake and drowsy  Airway & Oxygen Therapy: Patient Spontanous Breathing and Patient connected to face mask oxygen  Post-op Assessment: Report given to RN and Post -op Vital signs reviewed and stable  Post vital signs: Reviewed and stable  Last Vitals:  Vitals Value Taken Time  BP    Temp    Pulse 88 11/07/23 1607  Resp    SpO2 100 % 11/07/23 1607  Vitals shown include unfiled device data.  Last Pain:  Vitals:   11/07/23 1334  TempSrc:   PainSc: 0-No pain      Patients Stated Pain Goal: 6 (11/07/23 1334)  Complications: No notable events documented.

## 2023-11-07 NOTE — Anesthesia Preprocedure Evaluation (Addendum)
Anesthesia Evaluation  Patient identified by MRN, date of birth, ID band Patient awake    Reviewed: Allergy & Precautions, NPO status , Patient's Chart, lab work & pertinent test results  Airway Mallampati: II  TM Distance: >3 FB Neck ROM: Full    Dental  (+) Dental Advisory Given   Pulmonary Current Smoker and Patient abstained from smoking.   breath sounds clear to auscultation       Cardiovascular negative cardio ROS  Rhythm:Regular Rate:Normal     Neuro/Psych  Neuromuscular disease    GI/Hepatic negative GI ROS, Neg liver ROS,,,  Endo/Other  negative endocrine ROS    Renal/GU negative Renal ROS     Musculoskeletal   Abdominal   Peds  Hematology negative hematology ROS (+)   Anesthesia Other Findings   Reproductive/Obstetrics                             Anesthesia Physical Anesthesia Plan  ASA: 3  Anesthesia Plan: General   Post-op Pain Management: Tylenol PO (pre-op)*, Gabapentin PO (pre-op)* and Ketamine IV*   Induction: Intravenous  PONV Risk Score and Plan: 3 and Dexamethasone, Ondansetron, Midazolam and Treatment may vary due to age or medical condition  Airway Management Planned: Oral ETT  Additional Equipment:   Intra-op Plan:   Post-operative Plan: Extubation in OR  Informed Consent: I have reviewed the patients History and Physical, chart, labs and discussed the procedure including the risks, benefits and alternatives for the proposed anesthesia with the patient or authorized representative who has indicated his/her understanding and acceptance.     Dental advisory given  Plan Discussed with: CRNA  Anesthesia Plan Comments:        Anesthesia Quick Evaluation

## 2023-11-07 NOTE — H&P (Signed)
HPI: Terri Mckinney is an 42 y.o. female who is here for reversal of her ileostomy.  She is status post low anterior resection, hysterectomy and diverting loop ileostomy on Apr 04, 2023.  She has a history of stage IV colon cancer.  Postoperatively she did well but developed a small posterior leak approximately 2 weeks after the procedure.  This was treated conservatively.  This healed approximately 4 months after surgery but she developed increasing liver disease and needed to go back on chemotherapy.  She has now completed another round of chemotherapy and is ready for ileostomy reversal.  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   Port-A-Cath in place 05/24/2022   upper Rt chest   Tobacco abuse     Past Surgical History:  Procedure Laterality Date   BIOPSY  03/21/2022   Procedure: BIOPSY;  Surgeon: Lanelle Bal, DO;  [facility]: [facility];  Service: Endoscopy;;   COLONOSCOPY WITH PROPOFOL N/A 03/21/2022   Procedure: COLONOSCOPY WITH PROPOFOL;  Surgeon: Lanelle Bal, DO;  Location: [facility];  Service: Endoscopy;  Laterality: N/A;  8:30am   FLEXIBLE SIGMOIDOSCOPY N/A 05/19/2022   Procedure: FLEXIBLE SIGMOIDOSCOPY WITH TATTOO INJECTION;  Surgeon: Romie Levee, MD;  Location: [facility];  Service: General;  Laterality: N/A;   ILEOSTOMY N/A 04/04/2023   Procedure: DIVERTING ILEOSTOMY;  Surgeon: Romie Levee, MD;  Location: [facility];  Service: General;  Laterality: N/A;   LAPAROSCOPIC DIVERTED COLOSTOMY N/A 05/19/2022   Procedure: LAPAROSCOPIC DIVERTED OSTOMY;  Surgeon: Romie Levee, MD;  Location: [facility];  Service: General;  Laterality: N/A;   PORTACATH PLACEMENT Right 05/19/2022   Procedure: INSERTION PORT-A-CATH;  Surgeon: Romie Levee, MD;  Location: [facility];  Service: General;  Laterality: Right;   ROBOTIC ASSISTED TOTAL HYSTERECTOMY WITH BILATERAL SALPINGO  OOPHERECTOMY N/A 04/04/2023   Procedure: XI ROBOTIC ASSISTED TOTAL HYSTERECTOMY WITH BILATERAL SALPINGO OOPHORECTOMY;  Surgeon: Carver Fila, MD;  Location: [facility];  Service: Gynecology;  Laterality: N/A;   XI ROBOTIC ASSISTED LOWER ANTERIOR RESECTION N/A 04/04/2023   Procedure: XI ROBOTIC ASSISTED LOWER ANTERIOR RESECTION;  Surgeon: Romie Levee, MD;  Location: [facility];  Service: General;  Laterality: N/A;    Family History  Problem Relation Age of Onset   Heart disease Mother    Diabetes Mother    Hearing loss Father    Heart disease Father    Hyperlipidemia Father    Hypertension Father    Stroke Father    Arthritis Father    Diabetes Father    Learning disabilities Brother    Diabetes Maternal Grandmother    Heart disease Maternal Grandmother    Diabetes Maternal Grandfather    Diabetes Paternal Grandmother    Diabetes Paternal Grandfather    Leukemia Maternal Uncle        d. 69s   Colon cancer Paternal Aunt        dx 25s   Breast cancer Cousin 28   Lung cancer Maternal Aunt     Social:  reports that she has been smoking cigarettes. She has a 6.8 pack-year smoking history. She has been exposed to tobacco smoke. She has never used smokeless tobacco. She reports that she does not drink alcohol and does not use drugs.  Allergies:  Allergies  Allergen Reactions   Wellbutrin [Bupropion]     insomnia  Tramadol Rash    Mainly in chest and neck area    Current Meds  Medication Sig   Multiple Vitamin (MULTIVITAMIN) tablet Take 1 tablet by mouth daily.   vitamin B-12 (CYANOCOBALAMIN) 250 MCG tablet Take 250 mcg by mouth daily.     ROS - all of the below systems have been reviewed with the patient and positives are indicated with bold text General: chills, fever or night sweats Eyes: blurry vision or double vision ENT: epistaxis or sore throat Hematologic/Lymphatic: bleeding problems, blood clots or swollen lymph nodes Endocrine: temperature intolerance or  unexpected weight changes Resp: cough, shortness of breath, or wheezing CV: chest pain or dyspnea on exertion GI: as per HPI GU: dysuria, trouble voiding, or hematuria Neuro: TIA or stroke symptoms    PE Blood pressure 126/84, pulse (!) 109, temperature 98.7 F (37.1 C), temperature source Oral, resp. rate 16, height 5\' 8"  (1.727 m), weight 70.8 kg, last menstrual period 04/19/2022, SpO2 98%. Constitutional: NAD; conversant; no deformities Eyes: Moist conjunctiva; no lid lag; anicteric; PERRL Neck: Trachea midline; no thyromegaly Lungs: Normal respiratory effort CV: RRR GI: Abd soft, ileostomy in place MSK: Normal range of motion of extremities; no clubbing/cyanosis Psychiatric: Appropriate affect; alert and oriented x3   A/P: Terri Mckinney is an 42 y.o. female with diverting loop ileostomy due to low anterior resection for rectal cancer.  She has healed her anastomosis and is ready for takedown of her ileostomy.  We have discussed this in detail including risk of bleeding, infection, leak of anastomosis, wound infections and hernias as well as functional difficulties from her coloanal anastomosis.  All questions were answered.  Patient would like to proceed with surgery    Vanita Panda, MD  Colorectal and General Surgery [facility]

## 2023-11-07 NOTE — Op Note (Signed)
11/07/2023  3:55 PM  PATIENT:  Terri Mckinney  42 y.o. female  Patient Care Team: Junie Spencer, FNP as PCP - General (Family Medicine) Doreatha Massed, MD as Medical Oncologist (Medical Oncology) Therese Sarah, RN as Oncology Nurse Navigator (Medical Oncology)  PRE-OPERATIVE DIAGNOSIS:  RECTAL CANCER  POST-OPERATIVE DIAGNOSIS:  RECTAL CANCER  PROCEDURE:  DIVERTING LOOP ILEOSTOMY TAKEDOWN   Surgeon(s): Karie Soda, MD Romie Levee, MD  ASSISTANT: Dr Michaell Cowing   ANESTHESIA:   local and general  EBL:  Total I/O In: 100 [IV Piggyback:100] Out: -   DRAINS: none   SPECIMEN:  Source of Specimen:  ileostomy  DISPOSITION OF SPECIMEN:  PATHOLOGY  COUNTS:  YES  PLAN OF CARE: Admit to inpatient   PATIENT DISPOSITION:  PACU - hemodynamically stable.  INDICATION: 42 y.o. F with diverting loop ileostomy due to distal rectal anastomosis.  She has healed this anastomosis and is ready for takedown of her diversion.   OR FINDINGS: Normal-appearing loop ileostomy in the left lower quadrant.  DESCRIPTION: the patient was identified in the preoperative holding area and taken to the OR where they were laid supine on the operating room table.  General anesthesia was induced without difficulty. SCDs were also noted to be in place prior to the initiation of anesthesia.  The patient was then prepped and draped in the usual sterile fashion.   A surgical timeout was performed indicating the correct patient, procedure, positioning and need for preoperative antibiotics.   I began by making an incision around the mucocutaneous junction of the ileostomy using electrocautery.  Dissection was carried down through the subcutaneous tissues to the level of the fascia using blunt dissection and cautery.  The fascia edge was separated from the ileostomy using Metzenbaum scissors.  I entered into the abdomen and took down the rest of the ileostomy circumferentially.  I then transected the  ileostomy arms separately using 2, 75 mm GIA staplers.  Enterotomy was made in the ends of both of these loops of bowel and a 75 mm GIA stapler was inserted to create the anastomosis.  The common enterotomy channel was then closed using 2, 60 mm TA staplers.  The mesentery was divided and suture-ligated. The mesenteric defect was closed with interrupted 2-0 silk sutures.  An antitension suture was placed in the crotch of the anastomosis.  This was then placed back into the abdomen.  The fascia was closed using 2, #1 PDS sutures in a running fashion vertically.  The subcutaneous tissue was partially reapproximated using a 2-0 Vicryl pursestring suture.  A 2-0 Vicryl pursestring suture was also placed in the dermal layer.  A Telfa sponge was placed in the middle and this was covered with a sterile dressing.  The patient was then awakened from anesthesia and sent to the postanesthesia care unit in stable condition.  All counts were correct per operating room staff.  Vanita Panda, MD  Colorectal and General Surgery [facility]

## 2023-11-07 NOTE — Anesthesia Procedure Notes (Addendum)
Procedure Name: Intubation Date/Time: 11/07/2023 3:00 PM  Performed by: Lezlie Lye, CRNAPre-anesthesia Checklist: Patient identified, Emergency Drugs available, Suction available and Patient being monitored Patient Re-evaluated:Patient Re-evaluated prior to induction Oxygen Delivery Method: Circle system utilized Preoxygenation: Pre-oxygenation with 100% oxygen Induction Type: IV induction Ventilation: Mask ventilation without difficulty Laryngoscope Size: Miller and 3 Grade View: Grade I Tube type: Oral Tube size: 7.0 mm Number of attempts: 1 Airway Equipment and Method: Stylet Placement Confirmation: ETT inserted through vocal cords under direct vision, positive ETCO2 and breath sounds checked- equal and bilateral Secured at: 22 cm Tube secured with: Tape Dental Injury: Teeth and Oropharynx as per pre-operative assessment

## 2023-11-08 ENCOUNTER — Encounter (HOSPITAL_COMMUNITY): Payer: Self-pay | Admitting: General Surgery

## 2023-11-08 ENCOUNTER — Inpatient Hospital Stay: Payer: BC Managed Care – PPO

## 2023-11-08 LAB — CBC
HCT: 38.6 % (ref 36.0–46.0)
Hemoglobin: 12.6 g/dL (ref 12.0–15.0)
MCH: 32.9 pg (ref 26.0–34.0)
MCHC: 32.6 g/dL (ref 30.0–36.0)
MCV: 100.8 fL — ABNORMAL HIGH (ref 80.0–100.0)
Platelets: 277 10*3/uL (ref 150–400)
RBC: 3.83 MIL/uL — ABNORMAL LOW (ref 3.87–5.11)
RDW: 15.8 % — ABNORMAL HIGH (ref 11.5–15.5)
WBC: 13.2 10*3/uL — ABNORMAL HIGH (ref 4.0–10.5)
nRBC: 0 % (ref 0.0–0.2)

## 2023-11-08 LAB — BASIC METABOLIC PANEL
Anion gap: 8 (ref 5–15)
BUN: 12 mg/dL (ref 6–20)
CO2: 24 mmol/L (ref 22–32)
Calcium: 9.1 mg/dL (ref 8.9–10.3)
Chloride: 101 mmol/L (ref 98–111)
Creatinine, Ser: 0.69 mg/dL (ref 0.44–1.00)
GFR, Estimated: >60 mL/min (ref >60)
Glucose, Bld: 139 mg/dL — ABNORMAL HIGH (ref 70–99)
Potassium: 4.3 mmol/L (ref 3.5–5.1)
Sodium: 133 mmol/L — ABNORMAL LOW (ref 135–145)

## 2023-11-08 MED ORDER — OXYCODONE HCL 5 MG PO TABS: 5.0000 mg | ORAL_TABLET | Freq: Four times a day (QID) | ORAL | Status: DC | PRN

## 2023-11-08 NOTE — TOC Initial Note (Signed)
Transition of Care [facility]) - Initial/Assessment Note    Patient Details  Name: Terri Mckinney MRN: [id number] Date of Birth: [birth year]  Transition [facility]) CM/SW Contact:    Adrian Prows, RN Phone Number: 11/08/2023, 3:22 PM  Clinical Narrative:                 Sherron Monday w/ pt in room; pt says she lives at home w/ her sister Efraim Kaufmann [place], [phone]); she plans to return at d/c; pt has transportation; pt verified she has PCP and insurance; she denies SDOH risks; pt says she does not have DME, HH services, or home oxygen; no TOC needs; TOC will follow.  Expected Discharge Plan: Home/Self Care Barriers to Discharge: Continued Medical Work up   Patient Goals and CMS Choice Patient states their goals for this hospitalization and ongoing recovery are:: home CMS Medicare.gov Compare Post Acute Care list provided to:: Patient        Expected Discharge Plan and Services   Discharge Planning Services: CM Consult   Living arrangements for the past 2 months: Single Family Home                                      Prior Living Arrangements/Services Living arrangements for the past 2 months: Single Family Home Lives with:: Relatives Patient language and need for interpreter reviewed:: Yes Do you feel safe going back to the place where you live?: Yes      Need for Family Participation in Patient Care: Yes (Comment) Care giver support system in place?: Yes (comment) Current home services:  (n/a) Criminal Activity/Legal Involvement Pertinent to Current Situation/Hospitalization: No - Comment as needed  Activities of Daily Living   ADL Screening (condition at time of admission) Independently performs ADLs?: Yes (appropriate for developmental age) Is the patient deaf or have difficulty hearing?: No Does the patient have difficulty seeing, even when wearing glasses/contacts?: No Does the patient have difficulty concentrating, remembering, or making decisions?:  No  Permission Sought/Granted Permission sought to share information with : Case Manager Permission granted to share information with : Yes, Verbal Permission Granted  Share Information with NAME: Case Manager     Permission granted to share info w Relationship: Charlott Holler (sister) [phone]     Emotional Assessment Appearance:: Appears stated age Attitude/Demeanor/Rapport: Gracious Affect (typically observed): Accepting Orientation: : Oriented to Self, Oriented to Place, Oriented to  Time, Oriented to Situation Alcohol / Substance Use: Not Applicable Psych Involvement: No (comment)  Admission diagnosis:  Ileostomy in place [facility]) [Z93.2] Patient Active Problem List   Diagnosis Date Noted   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 [place] [address] Phone: [phone] Fax: [phone]     Social Determinants of Health (SDOH) Social History: SDOH Screenings   Food Insecurity: No Food Insecurity (11/08/2023)  Housing: Low Risk  (11/08/2023)  Transportation Needs: No Transportation Needs (11/08/2023)  Utilities: Not At Risk (11/08/2023)  Depression (PHQ2-9): Low Risk  (03/06/2023)  Tobacco Use: High Risk (11/07/2023)   SDOH Interventions: Food Insecurity Interventions: Intervention Not Indicated, Inpatient TOC Housing Interventions: Intervention Not Indicated, Inpatient TOC Transportation Interventions: Intervention Not Indicated, Inpatient TOC Utilities  Interventions: Intervention Not Indicated, Inpatient TOC   Readmission Risk Interventions     No data to display

## 2023-11-08 NOTE — Plan of Care (Signed)
  Problem: Activity: Goal: Ability to tolerate increased activity will improve Outcome: Progressing   Problem: Bowel/Gastric: Goal: Gastrointestinal status for postoperative course will improve Outcome: Progressing   Problem: Health Behavior/Discharge Planning: Goal: Identification of community resources to assist with postoperative recovery needs will improve Outcome: Progressing

## 2023-11-08 NOTE — Progress Notes (Signed)
1 Day Post-Op  Subjective: Did well overnight, feels a bit bloated but no nausea, passed a bit of flatus  Objective: Vital signs in last 24 hours: Temp:  [97.4 F (36.3 C)-98.7 F (37.1 C)] 97.7 F (36.5 C) (12/05 0616) Pulse Rate:  [66-109] 78 (12/05 0616) Resp:  [11-25] 16 (12/05 0616) BP: (117-140)/(68-91) 120/72 (12/05 0616) SpO2:  [98 %-100 %] 100 % (12/05 0616) Weight:  [70.8 kg] 70.8 kg (12/04 1334)   Intake/Output from previous day: 12/04 0701 - 12/05 0700 In: 2163.5 [P.O.:220; I.V.:1843.5; IV Piggyback:100] Out: 330 [Urine:300; Blood:30] Intake/Output this shift: No intake/output data recorded.   General appearance: alert and cooperative GI: soft, non-distended  Incision: drainage seems appropriate  Lab Results:  Recent Labs    11/08/23 0452  WBC 13.2*  HGB 12.6  HCT 38.6  PLT 277   BMET Recent Labs    11/08/23 0452  NA 133*  K 4.3  CL 101  CO2 24  GLUCOSE 139*  BUN 12  CREATININE 0.69  CALCIUM 9.1   PT/INR No results for input(s): "LABPROT", "INR" in the last 72 hours. ABG No results for input(s): "PHART", "HCO3" in the last 72 hours.  Invalid input(s): "PCO2", "PO2"  MEDS, Scheduled  acetaminophen  1,000 mg Oral Q6H   alvimopan  12 mg Oral BID   enoxaparin (LOVENOX) injection  40 mg Subcutaneous Q24H   feeding supplement  237 mL Oral BID BM   gabapentin  300 mg Oral BID   saccharomyces boulardii  250 mg Oral BID    Studies/Results: No results found.  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

## 2023-11-09 LAB — SURGICAL PATHOLOGY

## 2023-11-09 MED ORDER — OXYCODONE HCL 5 MG PO TABS: 5.0000 mg | ORAL_TABLET | Freq: Four times a day (QID) | ORAL | 0 refills | Status: DC | PRN

## 2023-11-09 NOTE — Discharge Instructions (Signed)
ABDOMINAL SURGERY: POST OP INSTRUCTIONS  DIET: Follow a light bland diet the first 24 hours after arrival home, such as soup, liquids, crackers, etc.  Be sure to include lots of fluids daily.  Avoid fast food or heavy meals as your are more likely to get nauseated.  Do not eat any uncooked fruits or vegetables for the next 2 weeks as your colon heals. Take your usually prescribed home medications unless otherwise directed. PAIN CONTROL: Pain is best controlled by a usual combination of three different methods TOGETHER: Ice/Heat Over the counter pain medication Prescription pain medication Most patients will experience some swelling and bruising around the incisions.  Ice packs or heating pads (30-60 minutes up to 6 times a day) will help. Use ice for the first few days to help decrease swelling and bruising, then switch to heat to help relax tight/sore spots and speed recovery.  Some people prefer to use ice alone, heat alone, alternating between ice & heat.  Experiment to what works for you.  Swelling and bruising can take several weeks to resolve.   It is helpful to take an over-the-counter pain medication regularly for the first few weeks.  Choose one of the following that works best for you: Naproxen (Aleve, etc)  Two 220mg  tabs twice a day Ibuprofen (Advil, etc) Three 200mg  tabs four times a day (every meal & bedtime) Acetaminophen (Tylenol, etc) 500-650mg  four times a day (every meal & bedtime) A  prescription for pain medication (such as oxycodone, hydrocodone, etc) should be given to you upon discharge.  Take your pain medication as prescribed.  If you are having problems/concerns with the prescription medicine (does not control pain, nausea, vomiting, rash, itching, etc), please call us [phone] to see if we need to switch you to a different pain medicine that will work better for you and/or control your side effect better. If you need a refill on your pain medication, please contact  your pharmacy.  They will contact our office to request authorization. Prescriptions will not be filled after 5 pm or on week-ends. Avoid getting constipated.  Between the surgery and the pain medications, it is common to experience some constipation.  Increasing fluid intake and taking a fiber supplement (such as Metamucil, Citrucel, FiberCon, MiraLax, etc) 1-2 times a day regularly will usually help prevent this problem from occurring.  A mild laxative (prune juice, Milk of Magnesia, MiraLax, etc) should be taken according to package directions if there are no bowel movements after 48 hours.   Watch out for diarrhea.  If you have many loose bowel movements, simplify your diet to bland foods & liquids for a few days.  Stop any stool softeners and decrease your fiber supplement.  Switching to mild anti-diarrheal medications (Kayopectate, Pepto Bismol) can help.  If this worsens or does not improve, please call us. Wash / shower every day.  You may shower over the incision / wound.  Avoid baths until the skin is fully healed.  Continue to shower over incision(s) after the dressing is off. Change your dressing daily. ACTIVITIES as tolerated:   You may resume regular (light) daily activities beginning the next day--such as daily self-care, walking, climbing stairs--gradually increasing activities as tolerated.  If you can walk 30 minutes without difficulty, it is safe to try more intense activity such as jogging, treadmill, bicycling, low-impact aerobics, swimming, etc. Save the most intensive and strenuous activity for last such as sit-ups, heavy lifting, contact sports, etc  Refrain from any heavy  lifting or straining until you are off narcotics for pain control.   DO NOT PUSH THROUGH PAIN.  Let pain be your guide: If it hurts to do something, don't do it.  Pain is your body warning you to avoid that activity for another week until the pain goes down. You may drive when you are no longer taking prescription  pain medication, you can comfortably wear a seatbelt, and you can safely maneuver your car and apply brakes. You may have sexual intercourse when it is comfortable.  FOLLOW UP in our office Please call [facility] at [phone] to set up an appointment to see your surgeon in the office for a follow-up appointment approximately 1-2 weeks after your surgery. Make sure that you call for this appointment the day you arrive home to insure a convenient appointment time. 10. IF YOU HAVE DISABILITY OR FAMILY LEAVE FORMS, BRING THEM TO THE OFFICE FOR PROCESSING.  DO NOT GIVE THEM TO YOUR DOCTOR.   WHEN TO CALL us [phone]: Poor pain control Reactions / problems with new medications (rash/itching, nausea, etc)  Fever over 101.5 F (38.5 C) Inability to urinate Nausea and/or vomiting Worsening swelling or bruising Continued bleeding from incision. Increased pain, redness, or drainage from the incision  The clinic staff is available to answer your questions during regular business hours (8:30am-5pm).  Please don't hesitate to call and ask to speak to one of our nurses for clinical concerns.   A surgeon from [facility] is always on call at the hospitals   If you have a medical emergency, go to the nearest emergency room or call 911.    [facility] [address], [address], [place], [place]  [address] ? MAIN: [phone]) [phone] ? TOLL FREE: [phone] ? FAX [phone] [email or web address]

## 2023-11-09 NOTE — Plan of Care (Signed)
  Problem: Education: Goal: Understanding of discharge needs will improve Outcome: Progressing   

## 2023-11-09 NOTE — Progress Notes (Signed)
2 Days Post-Op  Subjective: Did well overnight, having some bowel function  Objective: Vital signs in last 24 hours: Temp:  [97.4 F (36.3 C)-97.6 F (36.4 C)] 97.4 F (36.3 C) (12/06 0516) Pulse Rate:  [81-92] 81 (12/06 0516) Resp:  [18] 18 (12/06 0516) BP: (117-130)/(62-90) 117/76 (12/06 0516) SpO2:  [100 %] 100 % (12/06 0516)   Intake/Output from previous day: 12/05 0701 - 12/06 0700 In: 1740.1 [P.O.:1080; I.V.:660.1] Out: 0  Intake/Output this shift: Total I/O In: 400 [P.O.:400] Out: -    General appearance: alert and cooperative GI: soft, non-distended  Incision: clean, packing removed  Lab Results:  Recent Labs    11/08/23 0452  WBC 13.2*  HGB 12.6  HCT 38.6  PLT 277   BMET Recent Labs    11/08/23 0452  NA 133*  K 4.3  CL 101  CO2 24  GLUCOSE 139*  BUN 12  CREATININE 0.69  CALCIUM 9.1   PT/INR No results for input(s): "LABPROT", "INR" in the last 72 hours. ABG No results for input(s): "PHART", "HCO3" in the last 72 hours.  Invalid input(s): "PCO2", "PO2"  MEDS, Scheduled  acetaminophen  1,000 mg Oral Q6H   alvimopan  12 mg Oral BID   enoxaparin (LOVENOX) injection  40 mg Subcutaneous Q24H   feeding supplement  237 mL Oral BID BM   gabapentin  300 mg Oral BID   saccharomyces boulardii  250 mg Oral BID    Studies/Results: No results found.  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

## 2023-11-10 NOTE — Plan of Care (Signed)
  Problem: Education: Goal: Understanding of discharge needs will improve Outcome: Adequate for Discharge Goal: Verbalization of understanding of the causes of altered bowel function will improve Outcome: Adequate for Discharge   Problem: Activity: Goal: Ability to tolerate increased activity will improve Outcome: Adequate for Discharge   Problem: Bowel/Gastric: Goal: Gastrointestinal status for postoperative course will improve Outcome: Adequate for Discharge   Problem: Health Behavior/Discharge Planning: Goal: Identification of community resources to assist with postoperative recovery needs will improve Outcome: Adequate for Discharge   Problem: Nutritional: Goal: Will attain and maintain optimal nutritional status will improve Outcome: Adequate for Discharge   Problem: Clinical Measurements: Goal: Postoperative complications will be avoided or minimized Outcome: Adequate for Discharge   Problem: Respiratory: Goal: Respiratory status will improve Outcome: Adequate for Discharge   Problem: Skin Integrity: Goal: Will show signs of wound healing Outcome: Adequate for Discharge   Problem: Education: Goal: Knowledge of General Education information will improve Description: Including pain rating scale, medication(s)/side effects and non-pharmacologic comfort measures Outcome: Adequate for Discharge   Problem: Health Behavior/Discharge Planning: Goal: Ability to manage health-related needs will improve Outcome: Adequate for Discharge   Problem: Clinical Measurements: Goal: Ability to maintain clinical measurements within normal limits will improve Outcome: Adequate for Discharge Goal: Will remain free from infection Outcome: Adequate for Discharge Goal: Diagnostic test results will improve Outcome: Adequate for Discharge Goal: Respiratory complications will improve Outcome: Adequate for Discharge Goal: Cardiovascular complication will be avoided Outcome: Adequate for  Discharge   Problem: Activity: Goal: Risk for activity intolerance will decrease Outcome: Adequate for Discharge   Problem: Nutrition: Goal: Adequate nutrition will be maintained Outcome: Adequate for Discharge   Problem: Coping: Goal: Level of anxiety will decrease Outcome: Adequate for Discharge   Problem: Elimination: Goal: Will not experience complications related to bowel motility Outcome: Adequate for Discharge Goal: Will not experience complications related to urinary retention Outcome: Adequate for Discharge   Problem: Pain Management: Goal: General experience of comfort will improve Outcome: Adequate for Discharge   Problem: Safety: Goal: Ability to remain free from injury will improve Outcome: Adequate for Discharge   Problem: Skin Integrity: Goal: Risk for impaired skin integrity will decrease Outcome: Adequate for Discharge

## 2023-11-10 NOTE — Discharge Summary (Signed)
Physician Discharge Summary  Patient ID: Terri Mckinney MRN: [id number] DOB/AGE: 07-24-1981 42 y.o.  Admit date: 11/07/2023 Discharge date: 11/10/2023  Admission Diagnoses: Ileostomy, ileostomy reversal  Discharge Diagnoses:  Principal Problem:   Ileostomy in place [facility])   Discharged Condition: good  Hospital Course: Patient was admitted postop.  Patient underwent loop ileostomy takedown.  Please see operative full details.  Patient did well postoperatively.  She did have some slow return of bowel function.  She otherwise has been tolerating p.o. well.  She is on a regular diet.  She does have good pain control.  Patient was otherwise afebrile, deemed stable for discharge and discharged home.  Consults: None  Significant Diagnostic Studies: None  Treatments: surgery: As above  Discharge Exam: Blood pressure 119/63, pulse 76, temperature 97.9 F (36.6 C), temperature source Oral, resp. rate 20, height 5\' 8"  (1.727 m), weight 70.8 kg, last menstrual period 04/19/2022, SpO2 100%. General appearance: alert and cooperative GI: soft, non-tender; bowel sounds normal; no masses,  no organomegaly and incision clean dry and intact  Disposition: Discharge disposition: 01-Home or Self Care       Discharge Instructions     Diet - low sodium heart healthy   Complete by: As directed    Increase activity slowly   Complete by: As directed       Allergies as of 11/10/2023       Reactions   Wellbutrin [bupropion]    insomnia   Tramadol Rash   Mainly in chest and neck area        Medication List     TAKE these medications    estradiol 0.1 MG/GM vaginal cream Commonly known as: ESTRACE VAGINAL 1 week after surgery, begin to use cream nightly at bedtime for 2 weeks then 3 times a week after to assist with healing vagina. Use a fingertip size amount of cream on finger and place slightly past vaginal entrance. Do not use the applicator that comes with cream.   levofloxacin  500 MG tablet Commonly known as: LEVAQUIN Take 1 tablet (500 mg total) by mouth daily.   multivitamin tablet Take 1 tablet by mouth daily.   oxyCODONE 5 MG immediate release tablet Commonly known as: Oxy IR/ROXICODONE Take 1-2 tablets (5-10 mg total) by mouth every 6 (six) hours as needed for severe pain (pain score 7-10) or moderate pain (pain score 4-6). What changed:  how much to take reasons to take this   vitamin B-12 250 MCG tablet Commonly known as: CYANOCOBALAMIN Take 250 mcg by mouth daily.        Follow-up Information     Romie Levee, MD. Schedule an appointment as soon as possible for a visit in 2 week(s).   Specialties: General Surgery, Colon and Rectal Surgery Contact information: [address] [place] [address] [place] [address] [phone]                 Signed: Axel Filler 11/10/2023, 8:33 AM

## 2023-11-20 ENCOUNTER — Inpatient Hospital Stay: Payer: BC Managed Care – PPO

## 2023-11-20 ENCOUNTER — Ambulatory Visit: Payer: BC Managed Care – PPO

## 2023-11-20 ENCOUNTER — Inpatient Hospital Stay: Payer: BC Managed Care – PPO | Admitting: Hematology

## 2023-11-22 ENCOUNTER — Inpatient Hospital Stay: Payer: BC Managed Care – PPO

## 2023-12-05 ENCOUNTER — Other Ambulatory Visit: Payer: Self-pay

## 2023-12-10 ENCOUNTER — Inpatient Hospital Stay: Payer: BC Managed Care – PPO

## 2023-12-10 ENCOUNTER — Inpatient Hospital Stay: Payer: BC Managed Care – PPO | Admitting: Hematology

## 2023-12-10 DIAGNOSIS — C20 Malignant neoplasm of rectum: Secondary | ICD-10-CM

## 2023-12-10 DIAGNOSIS — Z95828 Presence of other vascular implants and grafts: Secondary | ICD-10-CM

## 2023-12-10 NOTE — Progress Notes (Signed)
 [facility] [address], [place] [address]    Clinic Day:  12/11/2023  Referring physician: Lavell Bari LABOR, FNP  Patient Care Team: Lavell Bari LABOR, FNP as PCP - General (Family Medicine) Rogers Hai, MD as Medical Oncologist (Medical Oncology) Celestia Joesph SQUIBB, RN as Oncology Nurse Navigator (Medical Oncology)   ASSESSMENT & PLAN:   Assessment: 1. 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  Stools have become bloody/mucousy lately. - Terri Mckinney also reported pain in the tailbone region since March 2023.  Terri Mckinney also has right-sided lower back pain. - 50 pound weight loss in the last 9 months, part of weight loss was intentional.  Terri Mckinney cut back on eating sweets and lost taste to sweets after COVID infection. - CT [facility] with contrast on 03/08/2022: Irregular circumferential masslike wall thickening of sigmoid colon/rectum with adjacent perirectal adenopathy.  Multiple small hypodense lesions in the liver, largest 2.9 cm in the central aspect of the liver, question metastatic disease. - Colonoscopy on 03/21/2022 by Dr. Cindie: Fungating infiltrative nearly completely obstructing mass in the rectosigmoid colon, mass was circumferential measuring 4 cm in length. - Pathology: Rectal mass biopsy consistent with invasive moderately differentiated adenocarcinoma.  As there is very scant invasive tumor, MSI studies were deferred. - PET scan (03/23/2022): Hypermetabolic rectal primary long-segment with SUV 14.3.  Left posterior perirectal lymph node 7 mm with SUV 2.9.  Multiple tiny foci of hepatic hypermetabolism. - MRI of the liver (04/01/2022): Multiple small hypovascular rim-enhancing liver lesions, predominantly in the right hepatic lobe measuring up to 1.1 cm.  3.3 cm hypervascular mass in the central liver, most consistent with FNH/hepatic adenoma. - Liver biopsy (04/20/2022): Metastatic moderately  differentiated colonic adenocarcinoma with mucinous features - NGS testing shows K-ras G12 R mutation, PIK3CA exon 21 mutation.  MS-stable.  TMB-low. - Cycle 1 of FOLFOXIRI on 05/29/2022, bevacizumab  added during cycle 2, cycle 13 on 11/15/2022 -MRI pelvis (10/23/2022): Known metastatic rectal cancer with persistent rectal narrowing and signs of involvement of posterior cervical stroma and anterior peritoneal reflection.  There is evidence of residual tumor in the posterior cervix.  Persistent presacral edema not substantially changed. - MRI liver (11/10/2022): 3 benign lesions and previous small rim-enhancing metastatic lesions in the liver are no longer seen. - Chemoradiation therapy with Xeloda  from 12/11/2022 through 01/18/2023. - 04/04/2023: Robotic assisted LAR, diverting ileostomy, TAH with BSO - Pathology: YpT4b, ypN0, YPM1, 0/12 lymph nodes involved, no tumor deposits identified, margins negative, LVI negative, PNI positive, 1.5 cm grade 2 adenocarcinoma in the rectosigmoid colon, no perforation, MMR preserved. - 11/07/2023: Diverting loop ileostomy takedown   2. Social/family history: - Terri Mckinney is separated and is seen today with her daughter.  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  Terri Mckinney is current active smoker, 1 pack/day for 27 years.  Denies drinking alcohol. - Paternal aunt had colon cancer.  Maternal cousin has breast cancer.  Maternal uncle had leukemia and maternal cousin had leukemia.    Plan: Metastatic rectal cancer to the liver: - Terri Mckinney had diverting loop ileostomy takedown on 11/07/2023. - Terri Mckinney reports spotting since surgery.  Terri Mckinney will follow-up with Dr. Viktoria. - We reviewed pathology report of the ileostomy site which did not reveal any malignancy. - Labs today: Normal LFTs and creatinine.  CBC grossly normal.  Last CEA was 11.6. - Will start back on FOLFIRI with 80% dose reduction since Terri Mckinney is having  diarrhea after surgery.  Terri Mckinney will receive next cycle in 2 weeks.  RTC 4 weeks for  follow-up.  Will obtain CT CAP with contrast.  Will add bevacizumab  at that time.  2.  Diarrhea: - This is worse since recent diverting loop ileostomy takedown.  Use Imodium  as needed.  3.  Peripheral neuropathy: - Tingling and numbness from the ankles to toes is stable.  No numbness in the hands.    Orders Placed This Encounter  Procedures   CT CHEST ABDOMEN PELVIS W CONTRAST    Standing Status:   Future    Expected Date:   01/11/2024    Expiration Date:   12/10/2024    If indicated for the ordered procedure, I authorize the administration of contrast media per Radiology protocol:   Yes    Does the patient have a contrast media/X-ray dye allergy?:   No    Preferred imaging location?:   [facility]    If indicated for the ordered procedure, I authorize the administration of oral contrast media per Radiology protocol:   Yes      I,Helena R Teague,acting as a scribe for Alean Stands, MD.,have documented all relevant documentation on the behalf of Alean Stands, MD,as directed by  Alean Stands, MD while in the presence of Alean Stands, MD.     I, Alean Stands MD, have reviewed the above documentation for accuracy and completeness, and I agree with the above.   Alean Stands, MD   1/7/20255:43 PM  CHIEF COMPLAINT:   Diagnosis: metastatic rectal cancer to the liver    Cancer Staging  Rectal cancer [facility]) Staging form: Colon and Rectum, AJCC 8th Edition - Clinical stage from 03/28/2022: Stage IVA (cTX, cN1, cM1a) - Unsigned    Prior Therapy: 1. FOLFOXIRI and bevacizumab , 13 cycles, 05/29/22 - 11/15/22  2. Xeloda  with radiation therapy, 12/11/22 - 01/18/23 3. LAR with diverting ileostomy and TAH w/ BSO, 04/04/23  Current Therapy:  FOLFIRI   HISTORY OF PRESENT ILLNESS:   Oncology History  Rectal cancer (HCC)  03/28/2022 Initial Diagnosis   Rectal cancer (HCC)   05/29/2022 - 07/26/2022 Chemotherapy   Patient is on Treatment Plan :  COLORECTAL FOLFOXIRI + Bevacizumab  q14d     05/29/2022 - 11/17/2022 Chemotherapy   Patient is on Treatment Plan : COLORECTAL FOLFOXIRI + Bevacizumab  q14d      Genetic Testing   No pathogenic variants identified on the [employer] CustomNext+RNA panel. The report date is 09/08/2022.  The CustomNext-Cancer+RNAinsight panel offered by [employer] [facility] includes sequencing and rearrangement analysis for the following 47 genes:  APC, ATM, AXIN2, BARD1, BMPR1A, BRCA1, BRCA2, BRIP1, CDH1, CDK4, CDKN2A, CHEK2, DICER1, EPCAM, GREM1, HOXB13, MEN1, MLH1, MSH2, MSH3, MSH6, MUTYH, NBN, NF1, NF2, NTHL1, PALB2, PMS2, POLD1, POLE, PTEN, RAD51C, RAD51D, RECQL, RET, SDHA, SDHAF2, SDHB, SDHC, SDHD, SMAD4, SMARCA4, STK11, TP53, TSC1, TSC2, and VHL.  RNA data is routinely analyzed for use in variant interpretation for all genes.   07/16/2023 -  Chemotherapy   Patient is on Treatment Plan : COLORECTAL FOLFIRI + Bevacizumab  q14d        INTERVAL HISTORY:   Terri Mckinney is a 43 y.o. female presenting to clinic today for follow up of metastatic rectal cancer to the liver. Terri Mckinney was last seen by me on 10/08/23.  Since her last visit, Terri Mckinney underwent ileostomy takedown on 11/07/23 under Dr. Debby. Pathology was negative for malignancy.  Today, Terri Mckinney states that Terri Mckinney is doing well overall. Her appetite level is at 90%. Her energy  level is at 85%.  Terri Mckinney denies any new pains. Her surgery site is well-healed. Her BM's still cause irritation after surgery, particularly with certain foods. Terri Mckinney was told by Dr. Debby it would take time for the colon to open up and improve symptoms. Terri Mckinney was also recommended to take Imodium  with certain foods. Terri Mckinney has not yet begun taking Imodium .   Terri Mckinney notes daily spotting that began after surgery and reportedly Dr. Debby will contact Dr. Viktoria to discuss this.   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   Port-A-Cath in place 05/24/2022   upper Rt chest   Tobacco abuse     Surgical History: Past Surgical History:  Procedure Laterality Date   BIOPSY  03/21/2022   Procedure: BIOPSY;  Surgeon: Cindie Carlin POUR, DO;  [facility];  Service: Endoscopy;;   COLONOSCOPY WITH PROPOFOL  N/A 03/21/2022   Procedure: COLONOSCOPY WITH PROPOFOL ;  Surgeon: Cindie Carlin POUR, DO;  Location: [facility];  Service: Endoscopy;  Laterality: N/A;  8:30am   FLEXIBLE SIGMOIDOSCOPY N/A 05/19/2022   Procedure: FLEXIBLE SIGMOIDOSCOPY WITH TATTOO INJECTION;  Surgeon: Debby Hila, MD;  Location: [facility];  Service: General;  Laterality: N/A;   ILEOSTOMY N/A 04/04/2023   Procedure: DIVERTING ILEOSTOMY;  Surgeon: Debby Hila, MD;  Location: [facility];  Service: General;  Laterality: N/A;   ILEOSTOMY CLOSURE N/A 11/07/2023   Procedure: DIVERTING LOOP ILEOSTOMY TAKEDOWN;  Surgeon: Debby Hila, MD;  Location: [facility];  Service: General;  Laterality: N/A;   LAPAROSCOPIC DIVERTED COLOSTOMY N/A 05/19/2022   Procedure: LAPAROSCOPIC DIVERTED OSTOMY;  Surgeon: Debby Hila, MD;  Location: [facility];  Service: General;  Laterality: N/A;   PORTACATH PLACEMENT Right 05/19/2022   Procedure: INSERTION PORT-A-CATH;  Surgeon: Debby Hila, MD;  Location: [facility];  Service: General;  Laterality: Right;   ROBOTIC ASSISTED TOTAL HYSTERECTOMY WITH BILATERAL SALPINGO OOPHERECTOMY N/A 04/04/2023   Procedure: XI ROBOTIC ASSISTED TOTAL HYSTERECTOMY WITH BILATERAL SALPINGO OOPHORECTOMY;  Surgeon: Viktoria Comer SAUNDERS, MD;  Location: [facility];  Service: Gynecology;  Laterality: N/A;   XI ROBOTIC ASSISTED LOWER ANTERIOR RESECTION N/A 04/04/2023   Procedure: XI ROBOTIC ASSISTED LOWER ANTERIOR RESECTION;  Surgeon: Debby Hila, MD;  Location: [facility];  Service: General;  Laterality: N/A;    Social History: Social History   Socioeconomic History   Marital status: Married    Spouse name: Not  on file   Number of children: Not on file   Years of education: Not on file   Highest education level: Not on file  Occupational History   Not on file  Tobacco Use   Smoking status: Every Day    Current packs/day: 0.25    Average packs/day: 0.3 packs/day for 27.0 years (6.8 ttl pk-yrs)    Types: Cigarettes    Passive exposure: Current   Smokeless tobacco: Never  Vaping Use   Vaping status: Never Used  Substance and Sexual Activity   Alcohol use: No   Drug use: No   Sexual activity: Yes    Birth control/protection: Injection  Other Topics Concern   Not on file  Social History Narrative   Not on file   Social Drivers of Health   Financial Resource Strain: Not on file  Food Insecurity: No Food Insecurity (11/08/2023)   Hunger Vital Sign  Worried About Programme Researcher, Broadcasting/film/video in the Last Year: Never true    Ran Out of Food in the Last Year: Never true  Transportation Needs: No Transportation Needs (11/08/2023)   PRAPARE - Administrator, Civil Service (Medical): No    Lack of Transportation (Non-Medical): No  Physical Activity: Not on file  Stress: Not on file  Social Connections: Not on file  Intimate Partner Violence: Not At Risk (11/08/2023)   Humiliation, Afraid, Rape, and Kick questionnaire    Fear of Current or Ex-Partner: No    Emotionally Abused: No    Physically Abused: No    Sexually Abused: No    Family History: Family History  Problem Relation Age of Onset   Heart disease Mother    Diabetes Mother    Hearing loss Father    Heart disease Father    Hyperlipidemia Father    Hypertension Father    Stroke Father    Arthritis Father    Diabetes Father    Learning disabilities Brother    Diabetes Maternal Grandmother    Heart disease Maternal Grandmother    Diabetes Maternal Grandfather    Diabetes Paternal Grandmother    Diabetes Paternal Grandfather    Leukemia Maternal Uncle        d. 91s   Colon cancer Paternal Aunt        dx 52s    Breast cancer Cousin 33   Lung cancer Maternal Aunt     Current Medications:  Current Outpatient Medications:    estradiol  (ESTRACE  VAGINAL) 0.1 MG/GM vaginal cream, 1 week after surgery, begin to use cream nightly at bedtime for 2 weeks then 3 times a week after to assist with healing vagina. Use a fingertip size amount of cream on finger and place slightly past vaginal entrance. Do not use the applicator that comes with cream., Disp: 42.5 g, Rfl: 3   Multiple Vitamin (MULTIVITAMIN) tablet, Take 1 tablet by mouth daily., Disp: , Rfl:  No current facility-administered medications for this visit.  Facility-Administered Medications Ordered in Other Visits:    fluorouracil  (ADRUCIL ) 3,500 mg in sodium chloride  0.9 % 80 mL chemo infusion, 1,920 mg/m2 (Treatment Plan Recorded), Intravenous, 1 day or 1 dose, Faizah Kandler, MD, Infusion Verify at 12/11/23 1340   Allergies: Allergies  Allergen Reactions   Wellbutrin  [Bupropion ]     insomnia   Tramadol  Rash    Mainly in chest and neck area    REVIEW OF SYSTEMS:   Review of Systems  Constitutional:  Negative for chills, fatigue and fever.  HENT:   Negative for lump/mass, mouth sores, nosebleeds, sore throat and trouble swallowing.   Eyes:  Negative for eye problems.  Respiratory:  Negative for cough and shortness of breath.   Cardiovascular:  Negative for chest pain, leg swelling and palpitations.  Gastrointestinal:  Negative for abdominal pain, constipation, diarrhea, nausea and vomiting.  Genitourinary:  Negative for bladder incontinence, difficulty urinating, dysuria, frequency, hematuria and nocturia.        +spotting  Musculoskeletal:  Negative for arthralgias, back pain, flank pain, myalgias and neck pain.  Skin:  Negative for itching and rash.  Neurological:  Negative for dizziness, headaches and numbness.       +tingling feet  Hematological:  Does not bruise/bleed easily.  Psychiatric/Behavioral:  Negative for depression,  sleep disturbance and suicidal ideas. The patient is not nervous/anxious.   All other systems reviewed and are negative.    VITALS:   Last menstrual  period 04/19/2022.  Wt Readings from Last 3 Encounters:  12/11/23 167 lb 8.8 oz (76 kg)  11/07/23 156 lb (70.8 kg)  10/29/23 156 lb (70.8 kg)    There is no height or weight on file to calculate BMI.  Performance status (ECOG): 0 - Asymptomatic  PHYSICAL EXAM:   Physical Exam Vitals and nursing note reviewed. Exam conducted with a chaperone present.  Constitutional:      Appearance: Normal appearance.  Cardiovascular:     Rate and Rhythm: Normal rate and regular rhythm.     Pulses: Normal pulses.     Heart sounds: Normal heart sounds.  Pulmonary:     Effort: Pulmonary effort is normal.     Breath sounds: Normal breath sounds.  Abdominal:     Palpations: Abdomen is soft. There is no hepatomegaly, splenomegaly or mass.     Tenderness: There is no abdominal tenderness.  Musculoskeletal:     Right lower leg: No edema.     Left lower leg: No edema.  Lymphadenopathy:     Cervical: No cervical adenopathy.     Right cervical: No superficial, deep or posterior cervical adenopathy.    Left cervical: No superficial, deep or posterior cervical adenopathy.     Upper Body:     Right upper body: No supraclavicular or axillary adenopathy.     Left upper body: No supraclavicular or axillary adenopathy.  Neurological:     General: No focal deficit present.     Mental Status: Terri Mckinney is alert and oriented to person, place, and time.  Psychiatric:        Mood and Affect: Mood normal.        Behavior: Behavior normal.     LABS:   CBC     Component Value Date/Time   WBC 6.6 12/11/2023 0819   RBC 3.88 12/11/2023 0819   HGB 12.1 12/11/2023 0819   HGB 12.3 03/03/2022 1618   HCT 37.9 12/11/2023 0819   HCT 35.4 03/03/2022 1618   PLT 278 12/11/2023 0819   PLT 351 03/03/2022 1618   MCV 97.7 12/11/2023 0819   MCV 91 03/03/2022 1618   MCH  31.2 12/11/2023 0819   MCHC 31.9 12/11/2023 0819   RDW 14.1 12/11/2023 0819   RDW 12.1 03/03/2022 1618   LYMPHSABS 1.0 12/11/2023 0819   LYMPHSABS 3.7 (H) 03/03/2022 1618   MONOABS 0.5 12/11/2023 0819   EOSABS 0.4 12/11/2023 0819   EOSABS 0.9 (H) 03/03/2022 1618   BASOSABS 0.1 12/11/2023 0819   BASOSABS 0.1 03/03/2022 1618    CMP      Component Value Date/Time   NA 137 12/11/2023 0819   NA 144 03/03/2022 1618   K 3.7 12/11/2023 0819   CL 103 12/11/2023 0819   CO2 26 12/11/2023 0819   GLUCOSE 99 12/11/2023 0819   BUN 11 12/11/2023 0819   BUN 10 03/03/2022 1618   CREATININE 0.64 12/11/2023 0819   CALCIUM  9.3 12/11/2023 0819   PROT 6.5 12/11/2023 0819   PROT 5.8 (L) 03/03/2022 1618   ALBUMIN 3.3 (L) 12/11/2023 0819   ALBUMIN 4.0 03/03/2022 1618   AST 14 (L) 12/11/2023 0819   ALT 13 12/11/2023 0819   ALKPHOS 67 12/11/2023 0819   BILITOT 0.5 12/11/2023 0819   BILITOT <0.2 03/03/2022 1618   GFRNONAA >60 12/11/2023 0819   GFRAA 97 07/16/2020 1604     Lab Results  Component Value Date   CEA1 11.6 (H) 09/25/2023   /  CEA  Date Value  Ref Range Status  09/25/2023 11.6 (H) 0.0 - 4.7 ng/mL Final    Comment:    (NOTE)                             Nonsmokers          <3.9                             Smokers             <5.6 [employer] Electrochemiluminescence Immunoassay (ECLIA) Values obtained with different assay methods or kits cannot be used interchangeably.  Results cannot be interpreted as absolute evidence of the presence or absence of malignant disease. Performed At: [facility] [address] [place], [place] [id number] Jennette Shorter MD Ey:[phone]    No results found for: PSA1 No results found for: CAN199 No results found for: CAN125  No results found for: TOTALPROTELP, ALBUMINELP, A1GS, A2GS, BETS, BETA2SER, GAMS, MSPIKE, SPEI No results found for: TIBC, FERRITIN, IRONPCTSAT No results found for:  LDH   STUDIES:   No results found.

## 2023-12-11 ENCOUNTER — Telehealth: Payer: Self-pay

## 2023-12-11 ENCOUNTER — Encounter: Payer: Self-pay | Admitting: Obstetrics & Gynecology

## 2023-12-11 ENCOUNTER — Inpatient Hospital Stay: Payer: BC Managed Care – PPO

## 2023-12-11 ENCOUNTER — Inpatient Hospital Stay: Payer: BC Managed Care – PPO | Attending: Hematology

## 2023-12-11 ENCOUNTER — Inpatient Hospital Stay: Payer: BC Managed Care – PPO | Attending: Hematology | Admitting: Hematology

## 2023-12-11 ENCOUNTER — Encounter (HOSPITAL_COMMUNITY): Payer: Self-pay | Admitting: Hematology

## 2023-12-11 VITALS — BP 101/60 | HR 76 | Temp 97.6°F | Resp 18

## 2023-12-11 VITALS — BP 109/67 | HR 96 | Temp 97.0°F | Resp 18 | Wt 167.5 lb

## 2023-12-11 DIAGNOSIS — G620 Drug-induced polyneuropathy: Secondary | ICD-10-CM | POA: Insufficient documentation

## 2023-12-11 DIAGNOSIS — C20 Malignant neoplasm of rectum: Secondary | ICD-10-CM

## 2023-12-11 DIAGNOSIS — F17210 Nicotine dependence, cigarettes, uncomplicated: Secondary | ICD-10-CM | POA: Insufficient documentation

## 2023-12-11 DIAGNOSIS — Z5111 Encounter for antineoplastic chemotherapy: Secondary | ICD-10-CM | POA: Insufficient documentation

## 2023-12-11 DIAGNOSIS — C19 Malignant neoplasm of rectosigmoid junction: Secondary | ICD-10-CM | POA: Diagnosis present

## 2023-12-11 DIAGNOSIS — Z95828 Presence of other vascular implants and grafts: Secondary | ICD-10-CM

## 2023-12-11 DIAGNOSIS — N939 Abnormal uterine and vaginal bleeding, unspecified: Secondary | ICD-10-CM | POA: Diagnosis not present

## 2023-12-11 DIAGNOSIS — Z90710 Acquired absence of both cervix and uterus: Secondary | ICD-10-CM | POA: Diagnosis not present

## 2023-12-11 DIAGNOSIS — N898 Other specified noninflammatory disorders of vagina: Secondary | ICD-10-CM | POA: Diagnosis not present

## 2023-12-11 DIAGNOSIS — Z9079 Acquired absence of other genital organ(s): Secondary | ICD-10-CM | POA: Insufficient documentation

## 2023-12-11 DIAGNOSIS — Z90722 Acquired absence of ovaries, bilateral: Secondary | ICD-10-CM | POA: Insufficient documentation

## 2023-12-11 DIAGNOSIS — R197 Diarrhea, unspecified: Secondary | ICD-10-CM | POA: Diagnosis not present

## 2023-12-11 DIAGNOSIS — C787 Secondary malignant neoplasm of liver and intrahepatic bile duct: Secondary | ICD-10-CM | POA: Diagnosis not present

## 2023-12-11 DIAGNOSIS — Z923 Personal history of irradiation: Secondary | ICD-10-CM | POA: Insufficient documentation

## 2023-12-11 LAB — COMPREHENSIVE METABOLIC PANEL
ALT: 13 U/L (ref 0–44)
AST: 14 U/L — ABNORMAL LOW (ref 15–41)
Albumin: 3.3 g/dL — ABNORMAL LOW (ref 3.5–5.0)
Alkaline Phosphatase: 67 U/L (ref 38–126)
Anion gap: 8 (ref 5–15)
BUN: 11 mg/dL (ref 6–20)
CO2: 26 mmol/L (ref 22–32)
Calcium: 9.3 mg/dL (ref 8.9–10.3)
Chloride: 103 mmol/L (ref 98–111)
Creatinine, Ser: 0.64 mg/dL (ref 0.44–1.00)
GFR, Estimated: >60 mL/min (ref >60)
Glucose, Bld: 99 mg/dL (ref 70–99)
Potassium: 3.7 mmol/L (ref 3.5–5.1)
Sodium: 137 mmol/L (ref 135–145)
Total Bilirubin: 0.5 mg/dL (ref 0.0–1.2)
Total Protein: 6.5 g/dL (ref 6.5–8.1)

## 2023-12-11 LAB — CBC WITH DIFFERENTIAL/PLATELET
Abs Immature Granulocytes: 0.03 10*3/uL (ref 0.00–0.07)
Basophils Absolute: 0.1 10*3/uL (ref 0.0–0.1)
Basophils Relative: 1 %
Eosinophils Absolute: 0.4 10*3/uL (ref 0.0–0.5)
Eosinophils Relative: 6 %
HCT: 37.9 % (ref 36.0–46.0)
Hemoglobin: 12.1 g/dL (ref 12.0–15.0)
Immature Granulocytes: 1 %
Lymphocytes Relative: 15 %
Lymphs Abs: 1 10*3/uL (ref 0.7–4.0)
MCH: 31.2 pg (ref 26.0–34.0)
MCHC: 31.9 g/dL (ref 30.0–36.0)
MCV: 97.7 fL (ref 80.0–100.0)
Monocytes Absolute: 0.5 10*3/uL (ref 0.1–1.0)
Monocytes Relative: 7 %
Neutro Abs: 4.7 10*3/uL (ref 1.7–7.7)
Neutrophils Relative %: 70 %
Platelets: 278 10*3/uL (ref 150–400)
RBC: 3.88 MIL/uL (ref 3.87–5.11)
RDW: 14.1 % (ref 11.5–15.5)
WBC: 6.6 10*3/uL (ref 4.0–10.5)
nRBC: 0 % (ref 0.0–0.2)

## 2023-12-11 LAB — URINALYSIS, DIPSTICK ONLY
Bilirubin Urine: NEGATIVE
Glucose, UA: NEGATIVE mg/dL
Hgb urine dipstick: NEGATIVE
Ketones, ur: NEGATIVE mg/dL
Nitrite: NEGATIVE
Protein, ur: NEGATIVE mg/dL
Specific Gravity, Urine: 1.018 (ref 1.005–1.030)
pH: 6 (ref 5.0–8.0)

## 2023-12-11 LAB — MAGNESIUM: Magnesium: 2 mg/dL (ref 1.7–2.4)

## 2023-12-11 MED ORDER — SODIUM CHLORIDE 0.9 % IV SOLN
144.0000 mg/m2 | Freq: Once | INTRAVENOUS | Status: AC
  Administered 2023-12-11: 260 mg via INTRAVENOUS
  Filled 2023-12-11: qty 13

## 2023-12-11 MED ORDER — SODIUM CHLORIDE 0.9 % IV SOLN
320.0000 mg/m2 | Freq: Once | INTRAVENOUS | Status: AC
  Administered 2023-12-11: 598 mg via INTRAVENOUS
  Filled 2023-12-11: qty 29.9

## 2023-12-11 MED ORDER — ATROPINE SULFATE 1 MG/ML IV SOLN
0.5000 mg | Freq: Once | INTRAVENOUS | Status: AC
  Administered 2023-12-11: 0.5 mg via INTRAVENOUS
  Filled 2023-12-11: qty 1

## 2023-12-11 MED ORDER — SODIUM CHLORIDE 0.9 % IV SOLN: Freq: Once | INTRAVENOUS | Status: AC

## 2023-12-11 MED ORDER — SODIUM CHLORIDE 0.9 % IV SOLN: 10.0000 mg | Freq: Once | INTRAVENOUS | Status: DC

## 2023-12-11 MED ORDER — FLUOROURACIL CHEMO INJECTION 5 GM/100ML
1920.0000 mg/m2 | INTRAVENOUS | Status: DC
  Administered 2023-12-11: 3500 mg via INTRAVENOUS
  Filled 2023-12-11: qty 70

## 2023-12-11 MED ORDER — FLUOROURACIL CHEMO INJECTION 2.5 GM/50ML
320.0000 mg/m2 | Freq: Once | INTRAVENOUS | Status: AC
  Administered 2023-12-11: 600 mg via INTRAVENOUS
  Filled 2023-12-11: qty 12

## 2023-12-11 MED ORDER — DEXAMETHASONE SODIUM PHOSPHATE 10 MG/ML IJ SOLN
10.0000 mg | Freq: Once | INTRAMUSCULAR | Status: AC
Start: 2023-12-11 — End: 2023-12-11
  Administered 2023-12-11: 10 mg via INTRAVENOUS
  Filled 2023-12-11: qty 1

## 2023-12-11 MED ORDER — PALONOSETRON HCL INJECTION 0.25 MG/5ML
0.2500 mg | Freq: Once | INTRAVENOUS | Status: AC
  Administered 2023-12-11: 0.25 mg via INTRAVENOUS
  Filled 2023-12-11: qty 5

## 2023-12-11 MED ORDER — SODIUM CHLORIDE FLUSH 0.9 % IV SOLN
10.0000 mL | Freq: Once | INTRAVENOUS | Status: AC
Start: 2023-12-11 — End: 2023-12-11
  Administered 2023-12-11: 10 mL via INTRAVENOUS
  Filled 2023-12-11: qty 10

## 2023-12-11 NOTE — Patient Instructions (Signed)

## 2023-12-11 NOTE — Telephone Encounter (Signed)
 Per Dr.Tucker, I attempted to reach Terri Mckinney regarding a follow up appointment with Dr.Jackson-Moore for vaginal spotting. There is an opening on 12/12/23

## 2023-12-11 NOTE — Patient Instructions (Signed)
 [facility] - A DEPT OF [facility]  Discharge Instructions: Thank you for choosing [facility] to provide your oncology and hematology care.  If you have a lab appointment with the Cancer Center - please note that after April 8th, 2024, all labs will be drawn in the cancer center.  You do not have to check in or register with the main entrance as you have in the past but will complete your check-in in the cancer center.  Wear comfortable clothing and clothing appropriate for easy access to any Portacath or PICC line.   We strive to give you quality time with your provider. You may need to reschedule your appointment if you arrive late (15 or more minutes).  Arriving late affects you and other patients whose appointments are after yours.  Also, if you miss three or more appointments without notifying the office, you may be dismissed from the clinic at the provider's discretion.      For prescription refill requests, have your pharmacy contact our office and allow 72 hours for refills to be completed.    Today you received the following chemotherapy and/or immunotherapy agents Irinotecan /Leucovorin /Fluorouracil .  Irinotecan  Injection What is this medication? IRINOTECAN  (ir in oh TEE kan) treats some types of cancer. It works by slowing down the growth of cancer cells. This medicine may be used for other purposes; ask your health care provider or pharmacist if you have questions. COMMON BRAND NAME(S): Camptosar  What should I tell my care team before I take this medication? They need to know if you have any of these conditions: Dehydration Diarrhea Infection, especially a viral infection, such as chickenpox, cold sores, herpes Liver disease Low blood cell levels (white cells, red cells, and platelets) Low levels of electrolytes, such as calcium , magnesium, or potassium in your blood Recent or ongoing radiation An unusual or allergic reaction to  irinotecan , other medications, foods, dyes, or preservatives If you or your partner are pregnant or trying to get pregnant Breast-feeding How should I use this medication? This medication is injected into a vein. It is given by your care team in a hospital or clinic setting. Talk to your care team about the use of this medication in children. Special care may be needed. Overdosage: If you think you have taken too much of this medicine contact a poison control center or emergency room at once. NOTE: This medicine is only for you. Do not share this medicine with others. What if I miss a dose? Keep appointments for follow-up doses. It is important not to miss your dose. Call your care team if you are unable to keep an appointment. What may interact with this medication? Do not take this medication with any of the following: Cobicistat Itraconazole This medication may also interact with the following: Certain antibiotics, such as clarithromycin, rifampin, rifabutin Certain antivirals for HIV or AIDS Certain medications for fungal infections, such as ketoconazole, posaconazole, voriconazole Certain medications for seizures, such as carbamazepine, phenobarbital, phenytoin Gemfibrozil Nefazodone St. John's wort This list may not describe all possible interactions. Give your health care provider a list of all the medicines, herbs, non-prescription drugs, or dietary supplements you use. Also tell them if you smoke, drink alcohol, or use illegal drugs. Some items may interact with your medicine. What should I watch for while using this medication? Your condition will be monitored carefully while you are receiving this medication. You may need blood work while taking this medication. This  medication may make you feel generally unwell. This is not uncommon as chemotherapy can affect healthy cells as well as cancer cells. Report any side effects. Continue your course of treatment even though you feel ill  unless your care team tells you to stop. This medication can cause serious side effects. To reduce the risk, your care team may give you other medications to take before receiving this one. Be sure to follow the directions from your care team. This medication may affect your coordination, reaction time, or judgement. Do not drive or operate machinery until you know how this medication affects you. Sit up or stand slowly to reduce the risk of dizzy or fainting spells. Drinking alcohol with this medication can increase the risk of these side effects. This medication may increase your risk of getting an infection. Call your care team for advice if you get a fever, chills, sore throat, or other symptoms of a cold or flu. Do not treat yourself. Try to avoid being around people who are sick. Avoid taking medications that contain aspirin, acetaminophen , ibuprofen, naproxen, or ketoprofen unless instructed by your care team. These medications may hide a fever. This medication may increase your risk to bruise or bleed. Call your care team if you notice any unusual bleeding. Be careful brushing or flossing your teeth or using a toothpick because you may get an infection or bleed more easily. If you have any dental work done, tell your dentist you are receiving this medication. Talk to your care team if you or your partner are pregnant or think either of you might be pregnant. This medication can cause serious birth defects if taken during pregnancy and for 6 months after the last dose. You will need a negative pregnancy test before starting this medication. Contraception is recommended while taking this medication and for 6 months after the last dose. Your care team can help you find the option that works for you. Do not father a child while taking this medication and for 3 months after the last dose. Use a condom for contraception during this time period. Do not breastfeed while taking this medication and for 7 days  after the last dose. This medication may cause infertility. Talk to your care team if you are concerned about your fertility. What side effects may I notice from receiving this medication? Side effects that you should report to your care team as soon as possible: Allergic reactions--skin rash, itching, hives, swelling of the face, lips, tongue, or throat Dry cough, shortness of breath or trouble breathing Increased saliva or tears, increased sweating, stomach cramping, diarrhea, small pupils, unusual weakness or fatigue, slow heartbeat Infection--fever, chills, cough, sore throat, wounds that don't heal, pain or trouble when passing urine, general feeling of discomfort or being unwell Kidney injury--decrease in the amount of urine, swelling of the ankles, hands, or feet Low red blood cell level--unusual weakness or fatigue, dizziness, headache, trouble breathing Severe or prolonged diarrhea Unusual bruising or bleeding Side effects that usually do not require medical attention (report to your care team if they continue or are bothersome): Constipation Diarrhea Hair loss Loss of appetite Nausea Stomach pain This list may not describe all possible side effects. Call your doctor for medical advice about side effects. You may report side effects to FDA at 1-800-FDA-1088. Where should I keep my medication? This medication is given in a hospital or clinic. It will not be stored at home. NOTE: This sheet is a summary. It may not cover all  possible information. If you have questions about this medicine, talk to your doctor, pharmacist, or health care provider.  2024 Elsevier/Gold Standard (2022-04-03 00:00:00)    Leucovorin  Injection What is this medication? LEUCOVORIN  (loo koe VOR in) prevents side effects from certain medications, such as methotrexate. It works by increasing folate levels. This helps protect healthy cells in your body. It may also be used to treat anemia caused by low levels  of folate. It can also be used with fluorouracil , a type of chemotherapy, to treat colorectal cancer. It works by increasing the effects of fluorouracil  in the body. This medicine may be used for other purposes; ask your health care provider or pharmacist if you have questions. What should I tell my care team before I take this medication? They need to know if you have any of these conditions: Anemia from low levels of vitamin B12 in the blood An unusual or allergic reaction to leucovorin , folic acid, other medications, foods, dyes, or preservatives Pregnant or trying to get pregnant Breastfeeding How should I use this medication? This medication is injected into a vein or a muscle. It is given by your care team in a hospital or clinic setting. Talk to your care team about the use of this medication in children. Special care may be needed. Overdosage: If you think you have taken too much of this medicine contact a poison control center or emergency room at once. NOTE: This medicine is only for you. Do not share this medicine with others. What if I miss a dose? Keep appointments for follow-up doses. It is important not to miss your dose. Call your care team if you are unable to keep an appointment. What may interact with this medication? Capecitabine  Fluorouracil  Phenobarbital Phenytoin Primidone Trimethoprim;sulfamethoxazole This list may not describe all possible interactions. Give your health care provider a list of all the medicines, herbs, non-prescription drugs, or dietary supplements you use. Also tell them if you smoke, drink alcohol, or use illegal drugs. Some items may interact with your medicine. What should I watch for while using this medication? Your condition will be monitored carefully while you are receiving this medication. This medication may increase the side effects of 5-fluorouracil . Tell your care team if you have diarrhea or mouth sores that do not get better or that  get worse. What side effects may I notice from receiving this medication? Side effects that you should report to your care team as soon as possible: Allergic reactions--skin rash, itching, hives, swelling of the face, lips, tongue, or throat This list may not describe all possible side effects. Call your doctor for medical advice about side effects. You may report side effects to FDA at 1-800-FDA-1088. Where should I keep my medication? This medication is given in a hospital or clinic. It will not be stored at home. NOTE: This sheet is a summary. It may not cover all possible information. If you have questions about this medicine, talk to your doctor, pharmacist, or health care provider.  2024 Elsevier/Gold Standard (2022-04-25 00:00:00)    Fluorouracil  Injection What is this medication? FLUOROURACIL  (flure oh YOOR a sil) treats some types of cancer. It works by slowing down the growth of cancer cells. This medicine may be used for other purposes; ask your health care provider or pharmacist if you have questions. COMMON BRAND NAME(S): Adrucil  What should I tell my care team before I take this medication? They need to know if you have any of these conditions: Blood disorders Dihydropyrimidine dehydrogenase (  DPD) deficiency Infection, such as chickenpox, cold sores, herpes Kidney disease Liver disease Poor nutrition Recent or ongoing radiation therapy An unusual or allergic reaction to fluorouracil , other medications, foods, dyes, or preservatives If you or your partner are pregnant or trying to get pregnant Breast-feeding How should I use this medication? This medication is injected into a vein. It is administered by your care team in a hospital or clinic setting. Talk to your care team about the use of this medication in children. Special care may be needed. Overdosage: If you think you have taken too much of this medicine contact a poison control center or emergency room at  once. NOTE: This medicine is only for you. Do not share this medicine with others. What if I miss a dose? Keep appointments for follow-up doses. It is important not to miss your dose. Call your care team if you are unable to keep an appointment. What may interact with this medication? Do not take this medication with any of the following: Live virus vaccines This medication may also interact with the following: Medications that treat or prevent blood clots, such as warfarin, enoxaparin , dalteparin This list may not describe all possible interactions. Give your health care provider a list of all the medicines, herbs, non-prescription drugs, or dietary supplements you use. Also tell them if you smoke, drink alcohol, or use illegal drugs. Some items may interact with your medicine. What should I watch for while using this medication? Your condition will be monitored carefully while you are receiving this medication. This medication may make you feel generally unwell. This is not uncommon as chemotherapy can affect healthy cells as well as cancer cells. Report any side effects. Continue your course of treatment even though you feel ill unless your care team tells you to stop. In some cases, you may be given additional medications to help with side effects. Follow all directions for their use. This medication may increase your risk of getting an infection. Call your care team for advice if you get a fever, chills, sore throat, or other symptoms of a cold or flu. Do not treat yourself. Try to avoid being around people who are sick. This medication may increase your risk to bruise or bleed. Call your care team if you notice any unusual bleeding. Be careful brushing or flossing your teeth or using a toothpick because you may get an infection or bleed more easily. If you have any dental work done, tell your dentist you are receiving this medication. Avoid taking medications that contain aspirin,  acetaminophen , ibuprofen, naproxen, or ketoprofen unless instructed by your care team. These medications may hide a fever. Do not treat diarrhea with over the counter products. Contact your care team if you have diarrhea that lasts more than 2 days or if it is severe and watery. This medication can make you more sensitive to the sun. Keep out of the sun. If you cannot avoid being in the sun, wear protective clothing and sunscreen. Do not use sun lamps, tanning beds, or tanning booths. Talk to your care team if you or your partner wish to become pregnant or think you might be pregnant. This medication can cause serious birth defects if taken during pregnancy and for 3 months after the last dose. A reliable form of contraception is recommended while taking this medication and for 3 months after the last dose. Talk to your care team about effective forms of contraception. Do not father a child while taking this medication and  for 3 months after the last dose. Use a condom while having sex during this time period. Do not breastfeed while taking this medication. This medication may cause infertility. Talk to your care team if you are concerned about your fertility. What side effects may I notice from receiving this medication? Side effects that you should report to your care team as soon as possible: Allergic reactions--skin rash, itching, hives, swelling of the face, lips, tongue, or throat Heart attack--pain or tightness in the chest, shoulders, arms, or jaw, nausea, shortness of breath, cold or clammy skin, feeling faint or lightheaded Heart failure--shortness of breath, swelling of the ankles, feet, or hands, sudden weight gain, unusual weakness or fatigue Heart rhythm changes--fast or irregular heartbeat, dizziness, feeling faint or lightheaded, chest pain, trouble breathing High ammonia level--unusual weakness or fatigue, confusion, loss of appetite, nausea, vomiting, seizures Infection--fever, chills,  cough, sore throat, wounds that don't heal, pain or trouble when passing urine, general feeling of discomfort or being unwell Low red blood cell level--unusual weakness or fatigue, dizziness, headache, trouble breathing Pain, tingling, or numbness in the hands or feet, muscle weakness, change in vision, confusion or trouble speaking, loss of balance or coordination, trouble walking, seizures Redness, swelling, and blistering of the skin over hands and feet Severe or prolonged diarrhea Unusual bruising or bleeding Side effects that usually do not require medical attention (report to your care team if they continue or are bothersome): Dry skin Headache Increased tears Nausea Pain, redness, or swelling with sores inside the mouth or throat Sensitivity to light Vomiting This list may not describe all possible side effects. Call your doctor for medical advice about side effects. You may report side effects to FDA at 1-800-FDA-1088. Where should I keep my medication? This medication is given in a hospital or clinic. It will not be stored at home. NOTE: This sheet is a summary. It may not cover all possible information. If you have questions about this medicine, talk to your doctor, pharmacist, or health care provider.  2024 Elsevier/Gold Standard (2022-03-28 00:00:00)        To help prevent nausea and vomiting after your treatment, we encourage you to take your nausea medication as directed.  BELOW ARE SYMPTOMS THAT SHOULD BE REPORTED IMMEDIATELY: *FEVER GREATER THAN 100.4 F (38 C) OR HIGHER *CHILLS OR SWEATING *NAUSEA AND VOMITING THAT IS NOT CONTROLLED WITH YOUR NAUSEA MEDICATION *UNUSUAL SHORTNESS OF BREATH *UNUSUAL BRUISING OR BLEEDING *URINARY PROBLEMS (pain or burning when urinating, or frequent urination) *BOWEL PROBLEMS (unusual diarrhea, constipation, pain near the anus) TENDERNESS IN MOUTH AND THROAT WITH OR WITHOUT PRESENCE OF ULCERS (sore throat, sores in mouth, or a  toothache) UNUSUAL RASH, SWELLING OR PAIN  UNUSUAL VAGINAL DISCHARGE OR ITCHING   Items with * indicate a potential emergency and should be followed up as soon as possible or go to the Emergency Department if any problems should occur.  Please show the CHEMOTHERAPY ALERT CARD or IMMUNOTHERAPY ALERT CARD at check-in to the Emergency Department and triage nurse.  Should you have questions after your visit or need to cancel or reschedule your appointment, please contact [facility] [place] - A DEPT OF [facility] [phone]  and follow the prompts.  Office hours are 8:00 a.m. to 4:30 p.m. Monday - Friday. Please note that voicemails left after 4:00 p.m. may not be returned until the following business day.  We are closed weekends and major holidays. You have access to a nurse at all  times for urgent questions. Please call the main number to the clinic [phone] and follow the prompts.  For any non-urgent questions, you may also contact your provider using MyChart. We now offer e-Visits for anyone 42 and older to request care online for non-urgent symptoms. For details visit [email or web address].   Also download the MyChart app! Go to the app store, search MyChart, open the app, select [facility], and log in with your MyChart username and password.

## 2023-12-11 NOTE — Progress Notes (Signed)
 Patient presents today for chemotherapy infusion. Patient is in satisfactory condition with no new complaints voiced.  Vital signs are stable.  Labs reviewed by Dr. Ellin Saba during the office visit and all labs are within treatment parameters.  We will proceed with treatment per MD orders.   Patient tolerated treatment well with no complaints voiced.  Home infusion 5FU pump connected.   Patient left ambulatory in stable condition.  Vital signs stable at discharge.  Follow up as scheduled.

## 2023-12-11 NOTE — Telephone Encounter (Signed)
 I spoke to Terri Mckinney, she is scheduled for a follow up appointment with Dr.Jackson-Moore on 12/12/23 @ 3:00pm. Pt agreed to date and time

## 2023-12-11 NOTE — Progress Notes (Signed)
 Patient has been examined by Dr. Ellin Saba. Vital signs and labs have been reviewed by MD - ANC, Creatinine, LFTs, hemoglobin, and platelets are within treatment parameters per M.D. - pt may proceed with treatment.  Primary RN and pharmacy notified.

## 2023-12-12 ENCOUNTER — Inpatient Hospital Stay: Payer: BC Managed Care – PPO

## 2023-12-12 ENCOUNTER — Inpatient Hospital Stay: Payer: BC Managed Care – PPO | Admitting: Obstetrics & Gynecology

## 2023-12-12 ENCOUNTER — Encounter: Payer: Self-pay | Admitting: Obstetrics & Gynecology

## 2023-12-12 VITALS — BP 115/59 | HR 90 | Temp 97.9°F | Resp 17 | Wt 169.2 lb

## 2023-12-12 DIAGNOSIS — C19 Malignant neoplasm of rectosigmoid junction: Secondary | ICD-10-CM | POA: Diagnosis not present

## 2023-12-12 DIAGNOSIS — N939 Abnormal uterine and vaginal bleeding, unspecified: Secondary | ICD-10-CM | POA: Diagnosis not present

## 2023-12-12 DIAGNOSIS — N898 Other specified noninflammatory disorders of vagina: Secondary | ICD-10-CM

## 2023-12-12 LAB — CEA: CEA: 24.7 ng/mL — ABNORMAL HIGH (ref 0.0–4.7)

## 2023-12-12 NOTE — Patient Instructions (Signed)
 Return in 2 weeks

## 2023-12-12 NOTE — Progress Notes (Signed)
 Follow Up Note: Gyn-Onc  Terri Mckinney 43 y.o. female  CC: Vaginal bleeding   HPI: The oncology history was reviewed.  Interval History: Terri Mckinney is a 43 y.o. woman with rectal adenocarcinoma status post neoadjuvant chemotherapy followed by chemo RT now status post debulking surgery.  Total hysterectomy with BSO performed at the same time given posterior cervical involvement by her adenocarcinoma.   She presents with vaginal spotting and irritation. The spotting began after a recent surgery to reconnect the small intestines. The patient describes the irritation as a burning sensation, particularly noticeable during wiping. She denies any associated pain.  The patient has been using estradiol  vaginal cream three times a week, which was initiated prior to the intestinal reconnection surgery. At that time, she had no bleeding. She has not started the transdermal ERT prescribed at the last visit. The patient is not sexually active.  In addition to the gynecological concerns, the patient has been managing bowel changes following the intestinal reconnection. She reports increased frequency of bowel movements, particularly after consuming certain foods. She has been advised to use Imodium  for excessive frequency.     Review of Systems  Review of Systems  Constitutional:  Negative for malaise/fatigue and weight loss.  Respiratory:  Negative for shortness of breath and wheezing.   Cardiovascular:  Negative for chest pain and leg swelling.  Gastrointestinal:  Negative for abdominal pain, blood in stool, constipation, nausea and vomiting.  Genitourinary:  Negative for dysuria, frequency, hematuria and urgency.  Musculoskeletal:  Negative for joint pain and myalgias.  Neurological:  Negative for weakness.  Psychiatric/Behavioral:  Negative for depression. The patient does not have insomnia.    Current medications, allergy, social history, past surgical history, past medical  history, family history were all reviewed.    Vitals:  Blood pressure (!) 115/59, pulse 90, temperature 97.9 F (36.6 C), temperature source Oral, resp. rate 17, weight 169 lb 3.2 oz (76.7 kg), last menstrual period 04/19/2022, SpO2 99%.  Physical Exam:  Physical Exam Exam conducted with a chaperone present.  Constitutional:      General: She is not in acute distress. Cardiovascular:     Rate and Rhythm: Normal rate and regular rhythm.  Pulmonary:     Effort: Pulmonary effort is normal.     Breath sounds: Normal breath sounds. No wheezing or rhonchi.  Abdominal:     Palpations: Abdomen is soft.     Tenderness: There is no abdominal tenderness. There is no right CVA tenderness or left CVA tenderness.     Hernia: No hernia is present.  Genitourinary:    General: Erythema in an hour glass configuration    Urethra: No urethral lesion.     Vagina: Friable, erythematous tissue at the introitus; low volume bright-red heme in the vault; exam limited by the pt's discomfort; no discrete lesions; cuff intact; no palpable abnormality Musculoskeletal:     Cervical back: Neck supple.     Right lower leg: No edema.     Left lower leg: No edema.  Lymphadenopathy:     Upper Body:     Right upper body: No supraclavicular adenopathy.     Left upper body: No supraclavicular adenopathy.     Lower Body: No right inguinal adenopathy. No left inguinal adenopathy.  Skin:    Findings: No rash.  Neurological:     Mental Status: She is oriented to person, place, and time.   Procedure: Vaginal Biopsy Description: Speculum placed. Granulation tissue observed at the introitus.SABRA  No visible lesions. Bleeding source originating from the right apex.  Limited tissue specimen obtained with biopsy forceps.  Monsel's was applied. Silver nitrate applied to granulation tissue in the lower vagina. Informed Consent: Patient consented to biopsy after being informed about the procedure. No detailed counseling about  risks, benefits, or alternatives was documented.  Assessment/Plan:  Vaginal Bleeding Post-hysterectomy patient with history of cervical tumor involvement of a ectal adenocarcinoma, now presenting with vaginal spotting and irritation after recent bowel reconnection surgery. DDx includes sequelae prior RT. Patient is using estradiol  vaginal cream three times a week. Examination revealed irritation and granulation tissue at the introitus and some bleeding from an unidentified source within the vagina. A biopsy was attempted for further investigation.  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 -Review biopsy results when available. -Schedule follow-up appointment in several weeks.  I personally spent 30 minutes face-to-face and non-face-to-face in the care of this patient, which includes all pre, intra, and post visit time on the date of service.    [place], MD

## 2023-12-13 ENCOUNTER — Inpatient Hospital Stay: Payer: BC Managed Care – PPO

## 2023-12-13 VITALS — BP 117/55 | HR 80 | Temp 97.2°F | Resp 18

## 2023-12-13 DIAGNOSIS — C19 Malignant neoplasm of rectosigmoid junction: Secondary | ICD-10-CM | POA: Diagnosis not present

## 2023-12-13 DIAGNOSIS — C20 Malignant neoplasm of rectum: Secondary | ICD-10-CM

## 2023-12-13 DIAGNOSIS — Z95828 Presence of other vascular implants and grafts: Secondary | ICD-10-CM

## 2023-12-13 MED ORDER — HEPARIN SOD (PORK) LOCK FLUSH 100 UNIT/ML IV SOLN
500.0000 [IU] | Freq: Once | INTRAVENOUS | Status: AC | PRN
  Administered 2023-12-13: 500 [IU]

## 2023-12-13 MED ORDER — SODIUM CHLORIDE 0.9% FLUSH
10.0000 mL | INTRAVENOUS | Status: DC | PRN
  Administered 2023-12-13: 10 mL

## 2023-12-13 NOTE — Progress Notes (Signed)
Patients port flushed without difficulty. Good blood return noted with no bruising or swelling noted at site. Band aid applied. VSS with discharge and left in satisfactory condition with no s/s of distress noted. All follow ups as scheduled.  Terri Mckinney [employer]

## 2023-12-14 LAB — SURGICAL PATHOLOGY

## 2023-12-24 ENCOUNTER — Telehealth: Payer: Self-pay

## 2023-12-24 NOTE — Telephone Encounter (Signed)
LVM for patient to call office regarding recent biopsy result

## 2023-12-24 NOTE — Telephone Encounter (Signed)
-----   Message from Nurse Gwenlyn Found sent at 12/24/2023 11:35 AM EST -----  ----- Message ----- From: Doylene Bode, NP Sent: 12/24/2023   8:55 AM EST To: Dineen Kid, RN; Shellee Milo, RN  Could you please call her and let her know her biopsy taken in the office at her visit with Dr. Tina Griffiths came back NEGATIVE for cancer or precancer.   Continue with plan for: Continue estradiol vaginal cream three times a week. Follow up in several weeks with Dr. Tina Griffiths

## 2023-12-25 ENCOUNTER — Inpatient Hospital Stay: Payer: BC Managed Care – PPO

## 2023-12-25 ENCOUNTER — Encounter (HOSPITAL_COMMUNITY): Payer: Self-pay | Admitting: Hematology

## 2023-12-25 VITALS — BP 114/53 | HR 74 | Resp 19

## 2023-12-25 DIAGNOSIS — Z95828 Presence of other vascular implants and grafts: Secondary | ICD-10-CM

## 2023-12-25 DIAGNOSIS — C19 Malignant neoplasm of rectosigmoid junction: Secondary | ICD-10-CM | POA: Diagnosis not present

## 2023-12-25 DIAGNOSIS — C20 Malignant neoplasm of rectum: Secondary | ICD-10-CM

## 2023-12-25 LAB — COMPREHENSIVE METABOLIC PANEL
ALT: 13 U/L (ref 0–44)
AST: 16 U/L (ref 15–41)
Albumin: 3.7 g/dL (ref 3.5–5.0)
Alkaline Phosphatase: 74 U/L (ref 38–126)
Anion gap: 10 (ref 5–15)
BUN: 11 mg/dL (ref 6–20)
CO2: 25 mmol/L (ref 22–32)
Calcium: 9.3 mg/dL (ref 8.9–10.3)
Chloride: 104 mmol/L (ref 98–111)
Creatinine, Ser: 0.59 mg/dL (ref 0.44–1.00)
GFR, Estimated: >60 mL/min (ref >60)
Glucose, Bld: 95 mg/dL (ref 70–99)
Potassium: 3.9 mmol/L (ref 3.5–5.1)
Sodium: 139 mmol/L (ref 135–145)
Total Bilirubin: 0.6 mg/dL (ref 0.0–1.2)
Total Protein: 6.6 g/dL (ref 6.5–8.1)

## 2023-12-25 LAB — CBC WITH DIFFERENTIAL/PLATELET
Abs Immature Granulocytes: 0.02 10*3/uL (ref 0.00–0.07)
Basophils Absolute: 0 10*3/uL (ref 0.0–0.1)
Basophils Relative: 1 %
Eosinophils Absolute: 0.4 10*3/uL (ref 0.0–0.5)
Eosinophils Relative: 6 %
HCT: 37.6 % (ref 36.0–46.0)
Hemoglobin: 12.6 g/dL (ref 12.0–15.0)
Immature Granulocytes: 0 %
Lymphocytes Relative: 13 %
Lymphs Abs: 0.8 10*3/uL (ref 0.7–4.0)
MCH: 32.6 pg (ref 26.0–34.0)
MCHC: 33.5 g/dL (ref 30.0–36.0)
MCV: 97.4 fL (ref 80.0–100.0)
Monocytes Absolute: 0.3 10*3/uL (ref 0.1–1.0)
Monocytes Relative: 6 %
Neutro Abs: 4.6 10*3/uL (ref 1.7–7.7)
Neutrophils Relative %: 74 %
Platelets: 244 10*3/uL (ref 150–400)
RBC: 3.86 MIL/uL — ABNORMAL LOW (ref 3.87–5.11)
RDW: 14.3 % (ref 11.5–15.5)
WBC: 6.2 10*3/uL (ref 4.0–10.5)
nRBC: 0 % (ref 0.0–0.2)

## 2023-12-25 LAB — MAGNESIUM: Magnesium: 2.1 mg/dL (ref 1.7–2.4)

## 2023-12-25 MED ORDER — PALONOSETRON HCL INJECTION 0.25 MG/5ML
0.2500 mg | Freq: Once | INTRAVENOUS | Status: AC
  Administered 2023-12-25: 0.25 mg via INTRAVENOUS
  Filled 2023-12-25: qty 5

## 2023-12-25 MED ORDER — FLUOROURACIL CHEMO INJECTION 5 GM/100ML
1920.0000 mg/m2 | INTRAVENOUS | Status: DC
  Administered 2023-12-25: 3500 mg via INTRAVENOUS
  Filled 2023-12-25: qty 70

## 2023-12-25 MED ORDER — DEXAMETHASONE SODIUM PHOSPHATE 10 MG/ML IJ SOLN
10.0000 mg | Freq: Once | INTRAMUSCULAR | Status: AC
  Administered 2023-12-25: 10 mg via INTRAVENOUS
  Filled 2023-12-25: qty 1

## 2023-12-25 MED ORDER — FLUOROURACIL CHEMO INJECTION 2.5 GM/50ML
320.0000 mg/m2 | Freq: Once | INTRAVENOUS | Status: AC
Start: 2023-12-25 — End: 2023-12-25
  Administered 2023-12-25: 600 mg via INTRAVENOUS
  Filled 2023-12-25: qty 12

## 2023-12-25 MED ORDER — SODIUM CHLORIDE 0.9 % IV SOLN: Freq: Once | INTRAVENOUS | Status: AC

## 2023-12-25 MED ORDER — LEUCOVORIN CALCIUM INJECTION 350 MG
320.0000 mg/m2 | Freq: Once | INTRAMUSCULAR | Status: AC
  Administered 2023-12-25: 598 mg via INTRAVENOUS
  Filled 2023-12-25: qty 29.9

## 2023-12-25 MED ORDER — ATROPINE SULFATE 1 MG/ML IV SOLN
0.5000 mg | Freq: Once | INTRAVENOUS | Status: AC
  Administered 2023-12-25: 0.5 mg via INTRAVENOUS
  Filled 2023-12-25: qty 1

## 2023-12-25 MED ORDER — IRINOTECAN HCL CHEMO INJECTION 100 MG/5ML
144.0000 mg/m2 | Freq: Once | INTRAVENOUS | Status: AC
  Administered 2023-12-25: 260 mg via INTRAVENOUS
  Filled 2023-12-25: qty 13

## 2023-12-25 NOTE — Patient Instructions (Signed)
[facility] [place] [facility]  Discharge Instructions: Thank you for choosing [facility] to provide your oncology and hematology care.  If you have a lab appointment with the Cancer Center - please note that after April 8th, 2024, all labs will be drawn in the cancer center.  You do not have to check in or register with the main entrance as you have in the past but will complete your check-in in the cancer center.  Wear comfortable clothing and clothing appropriate for easy access to any Portacath or PICC line.   We strive to give you quality time with your provider. You may need to reschedule your appointment if you arrive late (15 or more minutes).  Arriving late affects you and other patients whose appointments are after yours.  Also, if you miss three or more appointments without notifying the office, you may be dismissed from the clinic at the provider's discretion.      For prescription refill requests, have your pharmacy contact our office and allow 72 hours for refills to be completed.    Today you received the following chemotherapy and/or immunotherapy agents irinotecan. Leucovorin, adrucil    To help prevent nausea and vomiting after your treatment, we encourage you to take your nausea medication as directed.  BELOW ARE SYMPTOMS THAT SHOULD BE REPORTED IMMEDIATELY: *FEVER GREATER THAN 100.4 F (38 C) OR HIGHER *CHILLS OR SWEATING *NAUSEA AND VOMITING THAT IS NOT CONTROLLED WITH YOUR NAUSEA MEDICATION *UNUSUAL SHORTNESS OF BREATH *UNUSUAL BRUISING OR BLEEDING *URINARY PROBLEMS (pain or burning when urinating, or frequent urination) *BOWEL PROBLEMS (unusual diarrhea, constipation, pain near the anus) TENDERNESS IN MOUTH AND THROAT WITH OR WITHOUT PRESENCE OF ULCERS (sore throat, sores in mouth, or a toothache) UNUSUAL RASH, SWELLING OR PAIN  UNUSUAL VAGINAL DISCHARGE OR ITCHING   Items with * indicate a potential emergency and  should be followed up as soon as possible or go to the Emergency Department if any problems should occur.  Please show the CHEMOTHERAPY ALERT CARD or IMMUNOTHERAPY ALERT CARD at check-in to the Emergency Department and triage nurse.  Should you have questions after your visit or need to cancel or reschedule your appointment, please contact [facility] [place] - A DEPT OF [facility] [phone]  and follow the prompts.  Office hours are 8:00 a.m. to 4:30 p.m. Monday - Friday. Please note that voicemails left after 4:00 p.m. may not be returned until the following business day.  We are closed weekends and major holidays. You have access to a nurse at all times for urgent questions. Please call the main number to the clinic [phone] and follow the prompts.  For any non-urgent questions, you may also contact your provider using MyChart. We now offer e-Visits for anyone 57 and older to request care online for non-urgent symptoms. For details visit [email or web address].   Also download the MyChart app! Go to the app store, search "MyChart", open the app, select [place], and log in with your MyChart username and password.

## 2023-12-25 NOTE — Telephone Encounter (Signed)
Terri Mckinney is aware of recent biopsy results. She states she will continue using the Estradiol cream. Pt has a follow up appointment on 1/29 with Dr.Jackson-Moore

## 2023-12-25 NOTE — Progress Notes (Signed)
Patient presents to cancer center for [facility] treatment. she expressed to RN that she is having more abdominal pressure and noticed yesterday that she is having stool coming out of her vagina when she uses the bathroom. she is also having bright red bleeding. Per pt she sees her OB 1/29 for a follow up about her biopsy results. MD and treatment team made aware. Per MD to proceed with treatment today. Pt updated and all questions answer, pt agrees with plan.   Patient tolerated chemotherapy with no complaints voiced.  Side effects with management reviewed with understanding verbalized.  Port site clean and dry with no bruising or swelling noted at site.  Good blood return noted before and after administration of chemotherapy.  5FU pump started for home use. Pt due to return to clinic 12/27/23 for pump stop.  Patient left in satisfactory condition with VSS and no s/s of distress noted. All follow ups as scheduled.   Vong Garringer [employer]

## 2023-12-27 ENCOUNTER — Inpatient Hospital Stay: Payer: BC Managed Care – PPO

## 2023-12-27 VITALS — BP 104/59 | HR 86 | Temp 96.6°F | Resp 18

## 2023-12-27 DIAGNOSIS — C20 Malignant neoplasm of rectum: Secondary | ICD-10-CM

## 2023-12-27 DIAGNOSIS — Z95828 Presence of other vascular implants and grafts: Secondary | ICD-10-CM

## 2023-12-27 DIAGNOSIS — C19 Malignant neoplasm of rectosigmoid junction: Secondary | ICD-10-CM | POA: Diagnosis not present

## 2023-12-27 MED ORDER — HEPARIN SOD (PORK) LOCK FLUSH 100 UNIT/ML IV SOLN
500.0000 [IU] | Freq: Once | INTRAVENOUS | Status: AC | PRN
  Administered 2023-12-27: 500 [IU]

## 2023-12-27 MED ORDER — SODIUM CHLORIDE 0.9% FLUSH
10.0000 mL | INTRAVENOUS | Status: DC | PRN
  Administered 2023-12-27: 10 mL

## 2023-12-27 NOTE — Patient Instructions (Signed)
[facility] [place] - [facility]  Discharge Instructions: Thank you for choosing [facility] to provide your oncology and hematology care.  If you have a lab appointment with the Cancer Center - please note that after April 8th, 2024, all labs will be drawn in the cancer center.  You do not have to check in or register with the main entrance as you have in the past but will complete your check-in in the cancer center.  Wear comfortable clothing and clothing appropriate for easy access to any Portacath or PICC line.   We strive to give you quality time with your provider. You may need to reschedule your appointment if you arrive late (15 or more minutes).  Arriving late affects you and other patients whose appointments are after yours.  Also, if you miss three or more appointments without notifying the office, you may be dismissed from the clinic at the provider's discretion.      For prescription refill requests, have your pharmacy contact our office and allow 72 hours for refills to be completed.    Today you received the following chemo pump disconnected, return as scheduled.    To help prevent nausea and vomiting after your treatment, we encourage you to take your nausea medication as directed.  BELOW ARE SYMPTOMS THAT SHOULD BE REPORTED IMMEDIATELY: *FEVER GREATER THAN 100.4 F (38 C) OR HIGHER *CHILLS OR SWEATING *NAUSEA AND VOMITING THAT IS NOT CONTROLLED WITH YOUR NAUSEA MEDICATION *UNUSUAL SHORTNESS OF BREATH *UNUSUAL BRUISING OR BLEEDING *URINARY PROBLEMS (pain or burning when urinating, or frequent urination) *BOWEL PROBLEMS (unusual diarrhea, constipation, pain near the anus) TENDERNESS IN MOUTH AND THROAT WITH OR WITHOUT PRESENCE OF ULCERS (sore throat, sores in mouth, or a toothache) UNUSUAL RASH, SWELLING OR PAIN  UNUSUAL VAGINAL DISCHARGE OR ITCHING   Items with * indicate a potential emergency and should be followed up as  soon as possible or go to the Emergency Department if any problems should occur.  Please show the CHEMOTHERAPY ALERT CARD or IMMUNOTHERAPY ALERT CARD at check-in to the Emergency Department and triage nurse.  Should you have questions after your visit or need to cancel or reschedule your appointment, please contact [facility] [place] - A DEPT OF [facility] [phone]  and follow the prompts.  Office hours are 8:00 a.m. to 4:30 p.m. Monday - Friday. Please note that voicemails left after 4:00 p.m. may not be returned until the following business day.  We are closed weekends and major holidays. You have access to a nurse at all times for urgent questions. Please call the main number to the clinic [phone] and follow the prompts.  For any non-urgent questions, you may also contact your provider using MyChart. We now offer e-Visits for anyone 15 and older to request care online for non-urgent symptoms. For details visit [email or web address].   Also download the MyChart app! Go to the app store, search "MyChart", open the app, select [place], and log in with your MyChart username and password.

## 2023-12-29 ENCOUNTER — Encounter (HOSPITAL_COMMUNITY): Payer: Self-pay

## 2023-12-29 ENCOUNTER — Other Ambulatory Visit: Payer: Self-pay

## 2023-12-29 ENCOUNTER — Emergency Department (HOSPITAL_COMMUNITY)
Admission: EM | Admit: 2023-12-29 | Discharge: 2023-12-30 | Disposition: A | Discharge status: Home / Self Care | Payer: BC Managed Care – PPO | Attending: Student | Admitting: Student

## 2023-12-29 DIAGNOSIS — Z85038 Personal history of other malignant neoplasm of large intestine: Secondary | ICD-10-CM | POA: Diagnosis not present

## 2023-12-29 DIAGNOSIS — F17210 Nicotine dependence, cigarettes, uncomplicated: Secondary | ICD-10-CM | POA: Diagnosis not present

## 2023-12-29 DIAGNOSIS — C3490 Malignant neoplasm of unspecified part of unspecified bronchus or lung: Secondary | ICD-10-CM | POA: Diagnosis not present

## 2023-12-29 DIAGNOSIS — C7800 Secondary malignant neoplasm of unspecified lung: Secondary | ICD-10-CM

## 2023-12-29 DIAGNOSIS — N823 Fistula of vagina to large intestine: Secondary | ICD-10-CM | POA: Diagnosis not present

## 2023-12-29 DIAGNOSIS — N939 Abnormal uterine and vaginal bleeding, unspecified: Secondary | ICD-10-CM | POA: Diagnosis present

## 2023-12-29 DIAGNOSIS — N824 Other female intestinal-genital tract fistulae: Secondary | ICD-10-CM

## 2023-12-29 LAB — CBC WITH DIFFERENTIAL/PLATELET
Abs Immature Granulocytes: 0.02 10*3/uL (ref 0.00–0.07)
Basophils Absolute: 0 10*3/uL (ref 0.0–0.1)
Basophils Relative: 1 %
Eosinophils Absolute: 0.5 10*3/uL (ref 0.0–0.5)
Eosinophils Relative: 9 %
HCT: 38 % (ref 36.0–46.0)
Hemoglobin: 12.4 g/dL (ref 12.0–15.0)
Immature Granulocytes: 0 %
Lymphocytes Relative: 23 %
Lymphs Abs: 1.2 10*3/uL (ref 0.7–4.0)
MCH: 32 pg (ref 26.0–34.0)
MCHC: 32.6 g/dL (ref 30.0–36.0)
MCV: 97.9 fL (ref 80.0–100.0)
Monocytes Absolute: 0.2 10*3/uL (ref 0.1–1.0)
Monocytes Relative: 3 %
Neutro Abs: 3.5 10*3/uL (ref 1.7–7.7)
Neutrophils Relative %: 64 %
Platelets: 283 10*3/uL (ref 150–400)
RBC: 3.88 MIL/uL (ref 3.87–5.11)
RDW: 13.8 % (ref 11.5–15.5)
WBC: 5.4 10*3/uL (ref 4.0–10.5)
nRBC: 0 % (ref 0.0–0.2)

## 2023-12-29 LAB — COMPREHENSIVE METABOLIC PANEL
ALT: 15 U/L (ref 0–44)
AST: 14 U/L — ABNORMAL LOW (ref 15–41)
Albumin: 3.8 g/dL (ref 3.5–5.0)
Alkaline Phosphatase: 73 U/L (ref 38–126)
Anion gap: 9 (ref 5–15)
BUN: 16 mg/dL (ref 6–20)
CO2: 22 mmol/L (ref 22–32)
Calcium: 8.8 mg/dL — ABNORMAL LOW (ref 8.9–10.3)
Chloride: 103 mmol/L (ref 98–111)
Creatinine, Ser: 0.53 mg/dL (ref 0.44–1.00)
GFR, Estimated: >60 mL/min (ref >60)
Glucose, Bld: 114 mg/dL — ABNORMAL HIGH (ref 70–99)
Potassium: 4.1 mmol/L (ref 3.5–5.1)
Sodium: 134 mmol/L — ABNORMAL LOW (ref 135–145)
Total Bilirubin: 0.3 mg/dL (ref 0.0–1.2)
Total Protein: 6.9 g/dL (ref 6.5–8.1)

## 2023-12-29 NOTE — ED Triage Notes (Signed)
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 She had pelvic exam earlier in the week for vaginal spotting and thinks something tore. Pt currently has colorectal cancer and is receiving chemo. Denies fever, chills, body aches.

## 2023-12-30 ENCOUNTER — Emergency Department (HOSPITAL_COMMUNITY): Payer: BC Managed Care – PPO

## 2023-12-30 LAB — URINALYSIS, ROUTINE W REFLEX MICROSCOPIC
Bilirubin Urine: NEGATIVE
Glucose, UA: NEGATIVE mg/dL
Ketones, ur: NEGATIVE mg/dL
Nitrite: NEGATIVE
Protein, ur: NEGATIVE mg/dL
Specific Gravity, Urine: 1.03 (ref 1.005–1.030)
pH: 5 (ref 5.0–8.0)

## 2023-12-30 MED ORDER — IOHEXOL 300 MG/ML  SOLN
100.0000 mL | Freq: Once | INTRAMUSCULAR | Status: AC | PRN
  Administered 2023-12-30: 100 mL via INTRAVENOUS

## 2023-12-30 MED ORDER — CEFADROXIL 500 MG PO CAPS: 500.0000 mg | ORAL_CAPSULE | Freq: Two times a day (BID) | ORAL | 0 refills | Status: AC

## 2023-12-30 MED ORDER — LOPERAMIDE HCL 2 MG PO CAPS: 2.0000 mg | ORAL_CAPSULE | Freq: Four times a day (QID) | ORAL | 0 refills | Status: AC | PRN

## 2023-12-30 MED ORDER — SODIUM CHLORIDE 0.9 % IV SOLN
1.0000 g | Freq: Once | INTRAVENOUS | Status: AC
  Administered 2023-12-30: 1 g via INTRAVENOUS
  Filled 2023-12-30: qty 10

## 2023-12-30 NOTE — ED Provider Notes (Signed)
[place] EMERGENCY DEPARTMENT AT [facility] Provider Note  CSN: [id number] Arrival date & time: 12/29/23 2213  Chief Complaint(s) Vaginal Discharge  HPI Terri Mckinney is a 43 y.o. female With PMH rectal adenocarcinoma status post neoadjuvant chemotherapy and debulking surgery with TAH/BSO who presents emergency room for evaluation of feces from the vagina.  States symptoms have been worsening over the last 5 days.  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  Patient currently receiving active chemo in the outpatient setting.  Does endorse dysuria and pelvic pain.  Denies chest pain, shortness of breath, headache, fever or other systemic symptoms.   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 Authorizing Provider  estradiol (ESTRACE VAGINAL) 0.1 MG/GM vaginal cream 1 week after surgery, begin to use cream nightly at bedtime for 2 weeks then 3 times a week after to assist with healing vagina. Use a fingertip size amount of cream on finger and place slightly past vaginal entrance. Do not use the applicator that comes with cream. 04/11/23   Cross, Efraim Kaufmann D, NP  Multiple Vitamin (MULTIVITAMIN) tablet Take 1 tablet by mouth daily.     [provider]  prochlorperazine (COMPAZINE) 10 MG tablet Take 1 tablet (10 mg total) by mouth every 6 (six) hours as needed (Nausea or vomiting). 05/24/22 08/07/22  Doreatha Massed, MD                                                                                                                                    Past Surgical History Past Surgical History:  Procedure Laterality Date   BIOPSY  03/21/2022   Procedure: BIOPSY;  Surgeon: Lanelle Bal, DO;  Location: [facility];  Service: Endoscopy;;   COLONOSCOPY WITH PROPOFOL N/A 03/21/2022   Procedure: COLONOSCOPY WITH PROPOFOL;  Surgeon: Lanelle Bal, DO;  Location: [facility];  Service: Endoscopy;  Laterality: N/A;  8:30am   FLEXIBLE SIGMOIDOSCOPY N/A 05/19/2022   Procedure: FLEXIBLE SIGMOIDOSCOPY WITH TATTOO INJECTION;  Surgeon: Romie Levee, MD;  Location: [facility] OR[facility];  Service: General;  Laterality: N/A;   ILEOSTOMY N/A 04/04/2023   Procedure: DIVERTING  ILEOSTOMY;  Surgeon: Romie Levee, MD;  Location: [facility];  Service: General;  Laterality: N/A;   ILEOSTOMY CLOSURE N/A 11/07/2023   Procedure: DIVERTING LOOP ILEOSTOMY TAKEDOWN;  Surgeon: Romie Levee, MD;  Location: [facility];  Service: General;  Laterality: N/A;   LAPAROSCOPIC DIVERTED COLOSTOMY N/A 05/19/2022   Procedure: LAPAROSCOPIC DIVERTED OSTOMY;  Surgeon: Romie Levee, MD;  Location: [facility];  Service: General;  Laterality: N/A;   PORTACATH PLACEMENT Right 05/19/2022   Procedure: INSERTION PORT-A-CATH;  Surgeon: Romie Levee, MD;  Location: [facility];  Service: General;  Laterality: Right;   ROBOTIC ASSISTED TOTAL HYSTERECTOMY WITH BILATERAL SALPINGO OOPHERECTOMY N/A 04/04/2023   Procedure: XI ROBOTIC ASSISTED TOTAL HYSTERECTOMY WITH BILATERAL SALPINGO OOPHORECTOMY;  Surgeon: Carver Fila, MD;  Location: [facility];  Service: Gynecology;  Laterality: N/A;   XI ROBOTIC ASSISTED LOWER ANTERIOR RESECTION N/A 04/04/2023   Procedure: XI ROBOTIC ASSISTED LOWER ANTERIOR  RESECTION;  Surgeon: Romie Levee, MD;  Location: [facility];  Service: General;  Laterality: N/A;   Family History Family History  Problem Relation Age of Onset   Heart disease Mother    Diabetes Mother    Hearing loss Father    Heart disease Father    Hyperlipidemia Father    Hypertension Father    Stroke Father    Arthritis Father    Diabetes Father    Learning disabilities Brother    Diabetes Maternal Grandmother    Heart disease Maternal Grandmother    Diabetes Maternal Grandfather    Diabetes Paternal Grandmother    Diabetes Paternal Grandfather    Leukemia Maternal Uncle        d. 28s   Colon cancer Paternal Aunt        dx 41s   Breast cancer Cousin 33   Lung cancer Maternal Aunt     Social History Social History   Tobacco Use   Smoking status: Every Day    Current packs/day: 0.25    Average packs/day: 0.3 packs/day for 27.0 years (6.8 ttl pk-yrs)    Types: Cigarettes    Passive exposure: Current   Smokeless tobacco: Never  Vaping Use   Vaping status: Never Used  Substance Use Topics   Alcohol use: No   Drug use: No   Allergies Wellbutrin [bupropion] and Tramadol  Review of Systems Review of Systems  Genitourinary:  Positive for dysuria and vaginal discharge.    Physical Exam Vital Signs  I have reviewed the triage vital signs BP 112/60 (BP Location: Right Arm)   Pulse 78   Temp 98.4 F (36.9 C) (Oral)   Resp 17   Ht 5\' 8"  (1.727 m)   Wt 76.2 kg   LMP 04/19/2022 (Within Weeks) Comment: urine preg.pending 05/19/22-irregular periods,some spotting with depo shot.  SpO2 100%   BMI 25.54 kg/m   Physical Exam Vitals and nursing note reviewed.  Constitutional:      General: She is not in acute distress.    Appearance: She is well-developed.  HENT:     Head: Normocephalic and atraumatic.  Eyes:     Conjunctiva/sclera: Conjunctivae normal.  Cardiovascular:     Rate and Rhythm: Normal rate and regular rhythm.     Heart sounds: No murmur  heard. Pulmonary:     Effort: Pulmonary effort is normal. No respiratory distress.     Breath sounds: Normal breath sounds.  Abdominal:     Palpations: Abdomen is soft.     Tenderness: There is no abdominal tenderness.  Genitourinary:  Comments: Foul-smelling brown discharge from the vagina Musculoskeletal:        General: No swelling.     Cervical back: Neck supple.  Skin:    General: Skin is warm and dry.     Capillary Refill: Capillary refill takes less than 2 seconds.  Neurological:     Mental Status: She is alert.  Psychiatric:        Mood and Affect: Mood normal.     ED Results and Treatments Labs (all labs ordered are listed, but only abnormal results are displayed) Labs Reviewed  URINALYSIS, ROUTINE W REFLEX MICROSCOPIC - Abnormal; Notable for the following components:      Result Value   APPearance HAZY (*)    Hgb urine dipstick MODERATE (*)    Leukocytes,Ua LARGE (*)    Bacteria, UA RARE (*)    All other components within normal limits  COMPREHENSIVE METABOLIC PANEL - Abnormal; Notable for the following components:   Sodium 134 (*)    Glucose, Bld 114 (*)    Calcium 8.8 (*)    AST 14 (*)    All other components within normal limits  URINE CULTURE  CBC WITH DIFFERENTIAL/PLATELET                                                                                                                          Radiology No results found.  Pertinent labs & imaging results that were available during my care of the patient were reviewed by me and considered in my medical decision making (see MDM for details).  Medications Ordered in ED Medications  cefTRIAXone (ROCEPHIN) 1 g in sodium chloride 0.9 % 100 mL IVPB (0 g Intravenous Stopped 12/30/23 0517)  iohexol (OMNIPAQUE) 300 MG/ML solution 100 mL (100 mLs Intravenous Contrast Given 12/30/23 0444)                                                                                                                                      Procedures Procedures  (including critical care time)  Medical Decision Making / ED Course   This patient presents to the ED for concern of vaginal discharge, feces from vagina, this involves an extensive number of treatment options, and is a complaint that carries with it a high risk of complications and morbidity.  The differential diagnosis includes colovaginal fistula, malignancy, UTI,  MDM: Patient seen emergency room for  evaluation of vaginal discharge and concern for feces from the vagina.  Physical exam does reveal foul-smelling brown discharge from the vagina concerning for fecal matter.  Laboratory evaluation largely unremarkable.  Urinalysis is concerning for infection and patient covered with ceftriaxone.  Urine culture sent.  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  I spoke with patient's primary gyn onc physician Dr. Tamela Oddi who is recommending bulking agents to try to decrease liquid stool from entering fistulous cavity but patient is likely not an emergent surgical candidate at this time.  Will call back with additional recommendations.  These are pending at time of signout.  Please see provider signout note for continuation of workup.   Additional history obtained: -Additional history obtained from sister -External records from outside source obtained and reviewed including: Chart review including previous notes, labs, imaging, consultation notes   Lab Tests: -I ordered, reviewed, and interpreted labs.   The pertinent results include:   Labs Reviewed  URINALYSIS, ROUTINE W REFLEX MICROSCOPIC - Abnormal; Notable for the following components:      Result Value   APPearance HAZY (*)    Hgb urine dipstick MODERATE (*)    Leukocytes,Ua  LARGE (*)    Bacteria, UA RARE (*)    All other components within normal limits  COMPREHENSIVE METABOLIC PANEL - Abnormal; Notable for the following components:   Sodium 134 (*)    Glucose, Bld 114 (*)    Calcium 8.8 (*)    AST 14 (*)    All other components within normal limits  URINE CULTURE  CBC WITH DIFFERENTIAL/PLATELET      Imaging Studies ordered: I ordered imaging studies including CT chest abdomen pelvis I independently visualized and interpreted imaging. I agree with the radiologist interpretation   Medicines ordered and prescription drug management: Meds ordered this encounter  Medications   cefTRIAXone (ROCEPHIN) 1 g in sodium chloride 0.9 % 100 mL IVPB    Antibiotic Indication::   UTI   iohexol (OMNIPAQUE) 300 MG/ML solution 100 mL    -I have reviewed the patients home medicines and have made adjustments as needed  Critical interventions none  Consultations Obtained: I requested consultation with the Gyn ONC physician Dr. Tamela Oddi,  and discussed lab and imaging findings as well as pertinent plan - they recommend: Bulking agents   Cardiac Monitoring: The patient was maintained on a cardiac monitor.  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  Please see provider signout attenuation of workup.  Anticipate  discharge on antibiotics for UTI     Final Clinical Impression(s) / ED Diagnoses Final diagnoses:  None     @PCDICTATION @  Glendora Score, MD 12/30/23 2209

## 2023-12-31 LAB — URINE CULTURE: Culture: <10000 — AB

## 2024-01-02 ENCOUNTER — Encounter: Payer: Self-pay | Admitting: Obstetrics & Gynecology

## 2024-01-02 ENCOUNTER — Telehealth: Payer: Self-pay | Admitting: *Deleted

## 2024-01-02 ENCOUNTER — Inpatient Hospital Stay: Payer: BC Managed Care – PPO | Admitting: Obstetrics & Gynecology

## 2024-01-02 ENCOUNTER — Ambulatory Visit (HOSPITAL_COMMUNITY): Payer: BC Managed Care – PPO

## 2024-01-02 VITALS — BP 124/73 | HR 72 | Temp 97.9°F | Resp 19 | Wt 167.6 lb

## 2024-01-02 DIAGNOSIS — Z90722 Acquired absence of ovaries, bilateral: Secondary | ICD-10-CM

## 2024-01-02 DIAGNOSIS — N824 Other female intestinal-genital tract fistulae: Secondary | ICD-10-CM

## 2024-01-02 DIAGNOSIS — C19 Malignant neoplasm of rectosigmoid junction: Secondary | ICD-10-CM | POA: Diagnosis not present

## 2024-01-02 DIAGNOSIS — Z90710 Acquired absence of both cervix and uterus: Secondary | ICD-10-CM | POA: Diagnosis not present

## 2024-01-02 DIAGNOSIS — C20 Malignant neoplasm of rectum: Secondary | ICD-10-CM

## 2024-01-02 NOTE — Progress Notes (Signed)
Follow Up Note: Gyn-Onc  Terri Mckinney 43 y.o. female  CC: Stool per vagina   HPI: The oncology history was reviewed.  Interval History: Terri Mckinney is a 43 y.o. woman with rectal adenocarcinoma status post neoadjuvant chemotherapy followed by chemo RT now status post debulking surgery.  Total hysterectomy with BSO performed at the same time given posterior cervical involvement by her adenocarcinoma.    The patient presents with stool passing through the vagina and persistent vaginal bleeding.  She went to the ED w/similar complaints several days ago. A CTAP at that point was suspicious for a possible rectovaginal fistula. The vaginal bx from the last visit showed benign histology  She has been experiencing stool passing through her vagina, which began after her surgery. Despite taking Imodium to manage diarrhea, she sometimes experiences constipation. The stool is described as 'squirting out' and is painful, especially during urination. She was scheduled for a CT scan today but canceled it due to a recent scan at the emergency room. A barium enema was performed about two to three months ago, prior to her surgery.  Persistent vaginal bleeding has been ongoing, with a temporary cessation during chemotherapy. The bleeding resumed after the treatment ended and has continued since her last visit. Previous biopsy results were benign.  She uses vaginal cream to alleviate dryness and pain, applying it externally to avoid further irritation. A Pericare bottle is used for cleaning, as baby wipes have caused irritation.   Review of Systems  Review of Systems  Constitutional:  Negative for malaise/fatigue and weight loss.  Respiratory:  Negative for shortness of breath and wheezing.   Cardiovascular:  Negative for chest pain and leg swelling.  Gastrointestinal:  Negative for abdominal pain, blood in stool, constipation, nausea and vomiting.  Genitourinary:  See above   Musculoskeletal:  Negative for joint pain and myalgias.  Neurological:  Negative for weakness.  Psychiatric/Behavioral:  Negative for depression. The patient does not have insomnia.    Current medications, allergy, social history, past surgical history, past medical history, family history were all reviewed.    Vitals:  BP 124/73 (BP Location: Right Arm, Patient Position: Sitting)   Pulse 72   Temp 97.9 F (36.6 C) (Oral)   Resp 19   Wt 167 lb 9.6 oz (76 kg)   LMP 04/19/2022 (Within Weeks) Comment: urine preg.pending 05/19/22-irregular periods,some spotting with depo shot.  SpO2 99%   BMI 25.48 kg/m    Physical Exam:  Physical Exam Exam conducted with a chaperone present.  Constitutional:      General: She is not in acute distress. Cardiovascular:     Rate and Rhythm: Normal rate and regular rhythm.  Pulmonary:     Effort: Pulmonary effort is normal.     Breath sounds: Normal breath sounds. No wheezing or rhonchi.  Abdominal:     Palpations: Abdomen is soft.     Tenderness: There is no abdominal tenderness. There is no right CVA tenderness or left CVA tenderness.     Hernia: No hernia is present.  Genitourinary:    General: Erythema in an hour glass configuration    Urethra: No urethral lesion.     Vagina: Small heme and scant green stool present Musculoskeletal:     Cervical back: Neck supple.     Right lower leg: No edema.     Left lower leg: No edema.  Lymphadenopathy:     Upper Body:     Right upper body: No supraclavicular adenopathy.  Left upper body: No supraclavicular adenopathy.     Lower Body: No right inguinal adenopathy. No left inguinal adenopathy.  Skin:    Findings: No rash.  Neurological:     Mental Status: She is oriented to person, place, and time.   Procedure: Vaginal Biopsy Description: Speculum placed. Granulation tissue observed at the introitus.. No visible lesions. Bleeding source originating from the right apex.  Limited tissue specimen  obtained with biopsy forceps.  Monsel's was applied. Silver nitrate applied to granulation tissue in the lower vagina. Informed Consent: Patient consented to biopsy after being informed about the procedure. No detailed counseling about risks, benefits, or alternatives was documented.  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  New onset of stool passing through the vagina, confirmed on physical exam. Ongoing vaginal bleeding since surgery, with a brief respite during chemotherapy. No obvious infection. Previous biopsy showed benign tissue. -Schedule a CT scan with contrast in the rectum to further evaluate the fistula -Follow up w/Colorectal Surgery and GI -Optimize hygiene practices -High-fiber diet -Return prn   I personally spent 30 minutes face-to-face and non-face-to-face in the care of this patient, which includes all pre, intra, and post visit time on the date of service.    Antionette Char, MD

## 2024-01-02 NOTE — Telephone Encounter (Signed)
Attempted to reach patient to relay message from [name], NP. Left voicemail requesting call back to [phone].

## 2024-01-02 NOTE — Patient Instructions (Addendum)
Return in 1 month.  We have reached out to Dr. Maurine Minister office to get you in sooner.   In the mean time, Dr. Tamela Oddi has ordered a CT pelvis with contrast including rectal contrast to further evaluate for a fistula, or connection between your bowels and your vagina.

## 2024-01-02 NOTE — Telephone Encounter (Signed)
2nd attempt to reach patient to relay message from nurse practitioner. LVM requesting call back.

## 2024-01-02 NOTE — Telephone Encounter (Deleted)
-----   Message from Doylene Bode sent at 01/02/2024 11:30 AM EST ----- Please relay to the patient from Dr. Maisie Fus. Continue with plan for the CT pelvis and please let her know she will need to drink oral contrast. She wants to keep her appt in Feb where it is. Please discuss the high fiber diet to help bulk stools. Dr. Maisie Fus has reached out to the patient's GI doc for further evaluation so she should be hearing from them. You do not need to discuss colostomy ----- Message ----- From: Romie Levee, MD Sent: 01/02/2024  10:16 AM EST To: Doylene Bode, NP  Not much more to do at this time.  CT pelvis should show [place] what we need.  I've asked her GI doc to scope the area and biopsy.  Can have her do a high fiber diet to bulk the stool and maybe help it heal, but otherwise, she'll just need a permanent colostomy.  I'll see her in Feb as scheduled to discuss  Helmut Muster ----- Message ----- From: Doylene Bode, NP Sent: 01/02/2024  10:05 AM EST To: Romie Levee, MD  Good morning!    Dr. Tamela Oddi just saw this mutual patient this am for follow up of vaginal spotting and irritation. Looks like she had a CT on 12/30/2023 in the ER with findings for possible rectovaginal fistula. On exam today, Dr. Tina Griffiths did state it looked like stool in the vagina. She has ordered a CT pelvis with rectal contrast to further evaluate. Wanted to reach out to you since we are working with your office to possibly get the patient in sooner than the end of Feb and to see if you want [place] to proceed with the CT pelvis etc (wait, order something different etc).   Thank you!  Melissa

## 2024-01-02 NOTE — Telephone Encounter (Signed)
Opened in error

## 2024-01-03 ENCOUNTER — Encounter (HOSPITAL_COMMUNITY): Payer: Self-pay | Admitting: Hematology

## 2024-01-03 ENCOUNTER — Other Ambulatory Visit: Payer: Self-pay

## 2024-01-03 ENCOUNTER — Telehealth: Payer: Self-pay | Admitting: *Deleted

## 2024-01-03 NOTE — Telephone Encounter (Signed)
Third attempt to reach patient. Left voicemail requesting call back to [phone].

## 2024-01-03 NOTE — Telephone Encounter (Addendum)
-----   Message from Doylene Bode sent at 01/02/2024 11:30 AM EST ----- Please relay to the patient from Dr. Maisie Fus. Continue with plan for the CT pelvis and please let her know she will need to drink oral contrast. She wants to keep her appt in Feb where it is. Please discuss the high fiber diet to help bulk stools. Dr. Maisie Fus has reached out to the patient's GI doc for further evaluation so she should be hearing from them.

## 2024-01-03 NOTE — Telephone Encounter (Signed)
Spoke with [name] and relayed message from [name], NP that she spoke with Dr. Maisie Fus and continue plan for the CT pelvis and pt will need to drink oral contrast prior to test time of 5 pm. Advised pt to arrive by 3 pm to start drinking the oral contrast for her scheduled CT on 2/5. Also keep your appt. With Dr. Maisie Fus in February. Also advised patient to eat a high fiber diet to help bulk stools. Foods that are high in fiber would be fruits with skins, vegetables, lentils, whole grains like oatmeal and bran muffins. Dr. Maisie Fus also reached out to the pt's GI doc for further evaluation and the should be calling pt. Pt verbalized understanding and thanked the office for calling.

## 2024-01-07 ENCOUNTER — Other Ambulatory Visit: Payer: BC Managed Care – PPO

## 2024-01-07 NOTE — Progress Notes (Signed)
 [facility] [address] [place] [address]    Clinic Day:  01/08/2024  Referring physician: Lavell Bari LABOR, FNP  Patient Care Team: Lavell Bari LABOR, FNP as PCP - General (Family Medicine) Rogers Hai, MD as Medical Oncologist (Medical Oncology) Celestia Joesph SQUIBB, RN as Oncology Nurse Navigator (Medical Oncology)   ASSESSMENT & PLAN:   Assessment: 1. 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  Stools have become bloody/mucousy lately. - She also reported pain in the tailbone region since March 2023.  She also has right-sided lower back pain. - 50 pound weight loss in the last 9 months, part of weight loss was intentional.  She cut back on eating sweets and lost taste to sweets after COVID infection. - CT AP with contrast on 03/08/2022: Irregular circumferential masslike wall thickening of sigmoid colon/rectum with adjacent perirectal adenopathy.  Multiple small hypodense lesions in the liver, largest 2.9 cm in the central aspect of the liver, question metastatic disease. - Colonoscopy on 03/21/2022 by Dr. Cindie: Fungating infiltrative nearly completely obstructing mass in the rectosigmoid colon, mass was circumferential measuring 4 cm in length. - Pathology: Rectal mass biopsy consistent with invasive moderately differentiated adenocarcinoma.  As there is very scant invasive tumor, MSI studies were deferred. - PET scan (03/23/2022): Hypermetabolic rectal primary long-segment with SUV 14.3.  Left posterior perirectal lymph node 7 mm with SUV 2.9.  Multiple tiny foci of hepatic hypermetabolism. - MRI of the liver (04/01/2022): Multiple small hypovascular rim-enhancing liver lesions, predominantly in the right hepatic lobe measuring up to 1.1 cm.  3.3 cm hypervascular mass in the central liver, most consistent with FNH/hepatic adenoma. - Liver biopsy (04/20/2022): Metastatic moderately  differentiated colonic adenocarcinoma with mucinous features - NGS testing shows K-ras G12 R mutation, PIK3CA exon 21 mutation.  MS-stable.  TMB-low. - Cycle 1 of FOLFOXIRI on 05/29/2022, bevacizumab  added during cycle 2, cycle 13 on 11/15/2022 -MRI pelvis (10/23/2022): Known metastatic rectal cancer with persistent rectal narrowing and signs of involvement of posterior cervical stroma and anterior peritoneal reflection.  There is evidence of residual tumor in the posterior cervix.  Persistent presacral edema not substantially changed. - MRI liver (11/10/2022): 3 benign lesions and previous small rim-enhancing metastatic lesions in the liver are no longer seen. - Chemoradiation therapy with Xeloda  from 12/11/2022 through 01/18/2023. - 04/04/2023: Robotic assisted LAR, diverting ileostomy, TAH with BSO - Pathology: YpT4b, ypN0, YPM1, 0/12 lymph nodes involved, no tumor deposits identified, margins negative, LVI negative, PNI positive, 1.5 cm grade 2 adenocarcinoma in the rectosigmoid colon, no perforation, MMR preserved. - 11/07/2023: Diverting loop ileostomy takedown   2. Social/family history: - She is separated and is seen today with her daughter.  She works as a pensions consultant at [employer].  She is current active smoker, 1 pack/day for 27 years.  Denies drinking alcohol. - Paternal aunt had colon cancer.  Maternal cousin has breast cancer.  Maternal uncle had leukemia and maternal cousin had leukemia.    Plan: Metastatic rectal cancer to the liver: - CT CAP (12/30/2023): Left hepatic lobe lesion increased to 5.2 x 5.1 cm, previously 3.5 x 3 cm.  Posterior right liver lesion measures 1.8 x 1.7, previously 1.6 x 1.4.  Cavitary nodule in the right lung measures 13 x 10 mm, previously 12 x 11 mm.  3 mm nodule in the left upper lobe is now 6 mm.  This was compared to  CT from 10/01/2023. - Patient had 63-month break for surgery.  She started back on chemotherapy on 12/11/2023.  Hence I do not believe that she is  resistant to this regimen. - Unfortunately she is dealing with fistula and may require surgery soon.  Will consider local regional treatments by IR to slow down the liver progression if she needs prolonged time off chemotherapy. - Reviewed labs today: Normal LFTs.  CBC normal.  Last CEA on 12/11/2023 was 24.7. - Proceed with cycle 3 today with 100% dose.  I will see her back in 3 weeks the day after she sees Dr. Debby.  2.  Rectovaginal fistula: - She reported bloody stools per vagina and was evaluated by Dr. Leonce Ada.  A CT to further evaluate fistula will be done tomorrow.  Patient has an appointment to follow-up with Dr. Debby on 01/28/2024.  3.  Peripheral neuropathy: - Tingling in the ankles and toes is stable.  None in the hands.    No orders of the defined types were placed in this encounter.     [name],[name], MD.,have documented all relevant documentation on the behalf of Alean Stands, MD,as directed by  Alean Stands, MD while in the presence of Alean Stands, MD.  I, Alean Stands MD, have reviewed the above documentation for accuracy and completeness, and I agree with the above.    Alean Stands, MD   2/4/20259:35 AM  CHIEF COMPLAINT:   Diagnosis: metastatic rectal cancer to the liver    Cancer Staging  Rectal cancer [facility]) Staging form: Colon and Rectum, AJCC 8th Edition - Clinical stage from 03/28/2022: Stage IVA (cTX, cN1, cM1a) - Unsigned    Prior Therapy: 1. FOLFOXIRI and bevacizumab , 13 cycles, 05/29/22 - 11/15/22  2. Xeloda  with radiation therapy, 12/11/22 - 01/18/23 3. LAR with diverting ileostomy and TAH w/ BSO, 04/04/23  Current Therapy:  FOLFIRI   HISTORY OF PRESENT ILLNESS:   Oncology History  Rectal cancer (HCC)  03/28/2022 Initial Diagnosis   Rectal cancer (HCC)   05/29/2022 - 07/26/2022 Chemotherapy   Patient is on Treatment Plan : COLORECTAL FOLFOXIRI + Bevacizumab   q14d     05/29/2022 - 11/17/2022 Chemotherapy   Patient is on Treatment Plan : COLORECTAL FOLFOXIRI + Bevacizumab  q14d      Genetic Testing   No pathogenic variants identified on the [facility] CustomNext+RNA panel. The report date is 09/08/2022.  The CustomNext-Cancer+RNAinsight panel offered by [facility] includes sequencing and rearrangement analysis for the following 47 genes:  APC, ATM, AXIN2, BARD1, BMPR1A, BRCA1, BRCA2, BRIP1, CDH1, CDK4, CDKN2A, CHEK2, DICER1, EPCAM, GREM1, HOXB13, MEN1, MLH1, MSH2, MSH3, MSH6, MUTYH, NBN, NF1, NF2, NTHL1, PALB2, PMS2, POLD1, POLE, PTEN, RAD51C, RAD51D, RECQL, RET, SDHA, SDHAF2, SDHB, SDHC, SDHD, SMAD4, SMARCA4, STK11, TP53, TSC1, TSC2, and VHL.  RNA data is routinely analyzed for use in variant interpretation for all genes.   07/16/2023 -  Chemotherapy   Patient is on Treatment Plan : COLORECTAL FOLFIRI + Bevacizumab  q14d        INTERVAL HISTORY:   Terri Mckinney is a 43 y.o. female presenting to clinic today for follow up of metastatic rectal cancer to the liver. She was last seen by me on 12/11/23.  Since her last visit, she presented to the ED from 12/29/23 to 12/30/23 for a rectovaginal fistula. CT C/A/P showed progressive liver metastases, features concerning for progression of pulmonary metastases, rectal vaginal fistula, and presacral soft tissue is similar to prior and likely treatment  related. She was prescribed duricef 500 mg BID x 7 days.   Vaginal biopsy done on 12/12/23 showed benign squamous mucosa without significant diagnostic alteration, negative for dysplasia or malignancy.   Today, she states that she is doing well overall. Her appetite level is at 100%. Her energy level is at 100%.  She reports she will have a CT scan tomorrow to determine what treatment should be done on her rectovaginal fistula, with no surgical procedures planned. She notes she still has stool excreting from the vagina. She denies any pain to the area. She notes occasionally  she does not feel the urge to have a BM, but stool will still excrete from the vagina. She reports daily vaginal bleeding that is mixed with her stools, causing blood in the stools. Her fistula is followed up with GYN, with her next appointment on 01/28/24.  She does not believe diarrhea is due to treatment, and denies any side effects from treatment, including nausea, vomiting, or fatigue. She states treatment improved diarrhea, though she was constipated for 3 days after her last treatment, which was followed by watery stools 3 x a day.   She also has a GI appointment with Dr. Cindie tomorrow.   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   Port-A-Cath in place 05/24/2022   upper Rt chest   Tobacco abuse     Surgical History: Past Surgical History:  Procedure Laterality Date   BIOPSY  03/21/2022   Procedure: BIOPSY;  Surgeon: Cindie Carlin POUR, DO;  [facility];  Service: Endoscopy;;   COLONOSCOPY WITH PROPOFOL  N/A 03/21/2022   Procedure: COLONOSCOPY WITH PROPOFOL ;  Surgeon: Cindie Carlin POUR, DO;  Location: [facility];  Service: Endoscopy;  Laterality: N/A;  8:30am   FLEXIBLE SIGMOIDOSCOPY N/A 05/19/2022   Procedure: FLEXIBLE SIGMOIDOSCOPY WITH TATTOO INJECTION;  Surgeon: Debby Hila, MD;  Location: [facility];  Service: General;  Laterality: N/A;   ILEOSTOMY N/A 04/04/2023   Procedure: DIVERTING ILEOSTOMY;  Surgeon: Debby Hila, MD;  Location: [facility];  Service: General;  Laterality: N/A;   ILEOSTOMY CLOSURE N/A 11/07/2023   Procedure: DIVERTING LOOP ILEOSTOMY TAKEDOWN;  Surgeon: Debby Hila, MD;  Location: [facility];  Service: General;  Laterality: N/A;   LAPAROSCOPIC DIVERTED COLOSTOMY N/A 05/19/2022   Procedure: LAPAROSCOPIC DIVERTED OSTOMY;  Surgeon: Debby Hila, MD;  Location: [facility];  Service: General;  Laterality:  N/A;   PORTACATH PLACEMENT Right 05/19/2022   Procedure: INSERTION PORT-A-CATH;  Surgeon: Debby Hila, MD;  Location: [facility];  Service: General;  Laterality: Right;   ROBOTIC ASSISTED TOTAL HYSTERECTOMY WITH BILATERAL SALPINGO OOPHERECTOMY N/A 04/04/2023   Procedure: XI ROBOTIC ASSISTED TOTAL HYSTERECTOMY WITH BILATERAL SALPINGO OOPHORECTOMY;  Surgeon: Viktoria Comer SAUNDERS, MD;  Location: [facility];  Service: Gynecology;  Laterality: N/A;   XI ROBOTIC ASSISTED LOWER ANTERIOR RESECTION N/A 04/04/2023   Procedure: XI ROBOTIC ASSISTED LOWER ANTERIOR RESECTION;  Surgeon: Debby Hila, MD;  Location: [facility];  Service: General;  Laterality: N/A;    Social History: Social History   Socioeconomic History   Marital status: Married    Spouse name: Not on file   Number of children: Not on file   Years of education: Not on file   Highest education level: Not on file  Occupational History   Not on file  Tobacco  Use   Smoking status: Every Day    Current packs/day: 0.25    Average packs/day: 0.3 packs/day for 27.0 years (6.8 ttl pk-yrs)    Types: Cigarettes    Passive exposure: Current   Smokeless tobacco: Never  Vaping Use   Vaping status: Never Used  Substance and Sexual Activity   Alcohol use: No   Drug use: No   Sexual activity: Yes    Birth control/protection: Injection  Other Topics Concern   Not on file  Social History Narrative   Not on file   Social Drivers of Health   Financial Resource Strain: Not on file  Food Insecurity: No Food Insecurity (11/08/2023)   Hunger Vital Sign    Worried About Running Out of Food in the Last Year: Never true    Ran Out of Food in the Last Year: Never true  Transportation Needs: No Transportation Needs (11/08/2023)   PRAPARE - Administrator, Civil Service (Medical): No    Lack of Transportation (Non-Medical): No  Physical Activity: Not on file  Stress: Not on file  Social Connections: Not on file  Intimate Partner Violence: Not At  Risk (11/08/2023)   Humiliation, Afraid, Rape, and Kick questionnaire    Fear of Current or Ex-Partner: No    Emotionally Abused: No    Physically Abused: No    Sexually Abused: No    Family History: Family History  Problem Relation Age of Onset   Heart disease Mother    Diabetes Mother    Hearing loss Father    Heart disease Father    Hyperlipidemia Father    Hypertension Father    Stroke Father    Arthritis Father    Diabetes Father    Learning disabilities Brother    Diabetes Maternal Grandmother    Heart disease Maternal Grandmother    Diabetes Maternal Grandfather    Diabetes Paternal Grandmother    Diabetes Paternal Grandfather    Leukemia Maternal Uncle        d. 29s   Colon cancer Paternal Aunt        dx 53s   Breast cancer Cousin 33   Lung cancer Maternal Aunt     Current Medications:  Current Outpatient Medications:    estradiol  (ESTRACE  VAGINAL) 0.1 MG/GM vaginal cream, 1 week after surgery, begin to use cream nightly at bedtime for 2 weeks then 3 times a week after to assist with healing vagina. Use a fingertip size amount of cream on finger and place slightly past vaginal entrance. Do not use the applicator that comes with cream., Disp: 42.5 g, Rfl: 3   loperamide  (IMODIUM ) 2 MG capsule, Take 1 capsule (2 mg total) by mouth 4 (four) times daily as needed for diarrhea or loose stools., Disp: 12 capsule, Rfl: 0   Multiple Vitamin (MULTIVITAMIN) tablet, Take 1 tablet by mouth daily., Disp: , Rfl:  No current facility-administered medications for this visit.  Facility-Administered Medications Ordered in Other Visits:    fluorouracil  (ADRUCIL ) 4,500 mg in sodium chloride  0.9 % 60 mL chemo infusion, 2,400 mg/m2 (Treatment Plan Recorded), Intravenous, 1 day or 1 dose, Eowyn Tabone, MD   fluorouracil  (ADRUCIL ) chemo injection 750 mg, 400 mg/m2 (Treatment Plan Recorded), Intravenous, Once, Rogers Hai, MD   irinotecan  (CAMPTOSAR ) 340 mg in sodium  chloride 0.9 % 500 mL chemo infusion, 180 mg/m2 (Treatment Plan Recorded), Intravenous, Once, Rogers Hai, MD   leucovorin  748 mg in sodium chloride  0.9 % 250 mL infusion, 400  mg/m2 (Treatment Plan Recorded), Intravenous, Once, Rogers Hai, MD   Allergies: Allergies  Allergen Reactions   Wellbutrin  [Bupropion ]     insomnia   Tramadol  Rash    Mainly in chest and neck area    REVIEW OF SYSTEMS:   Review of Systems  Constitutional:  Negative for chills, fatigue and fever.  HENT:   Negative for lump/mass, mouth sores, nosebleeds, sore throat and trouble swallowing.   Eyes:  Negative for eye problems.  Respiratory:  Negative for cough and shortness of breath.   Cardiovascular:  Negative for chest pain, leg swelling and palpitations.  Gastrointestinal:  Positive for blood in stool, constipation, diarrhea and rectal pain (5/10 severity). Negative for abdominal pain, nausea and vomiting.  Genitourinary:  Negative for bladder incontinence, difficulty urinating, dysuria, frequency, hematuria and nocturia.   Musculoskeletal:  Negative for arthralgias, back pain, flank pain, myalgias and neck pain.  Skin:  Negative for itching and rash.  Neurological:  Positive for numbness (in feet). Negative for dizziness and headaches.  Hematological:  Does not bruise/bleed easily.  Psychiatric/Behavioral:  Negative for depression, sleep disturbance and suicidal ideas. The patient is not nervous/anxious.   All other systems reviewed and are negative.    VITALS:   Last menstrual period 04/19/2022.  Wt Readings from Last 3 Encounters:  01/08/24 166 lb 0.1 oz (75.3 kg)  01/02/24 167 lb 9.6 oz (76 kg)  12/29/23 167 lb 15.9 oz (76.2 kg)    There is no height or weight on file to calculate BMI.  Performance status (ECOG): 0 - Asymptomatic  PHYSICAL EXAM:   Physical Exam Vitals and nursing note reviewed. Exam conducted with a chaperone present.  Constitutional:      Appearance: Normal  appearance.  Cardiovascular:     Rate and Rhythm: Normal rate and regular rhythm.     Pulses: Normal pulses.     Heart sounds: Normal heart sounds.  Pulmonary:     Effort: Pulmonary effort is normal.     Breath sounds: Normal breath sounds.  Abdominal:     Palpations: Abdomen is soft. There is no hepatomegaly, splenomegaly or mass.     Tenderness: There is no abdominal tenderness.  Musculoskeletal:     Right lower leg: No edema.     Left lower leg: No edema.  Lymphadenopathy:     Cervical: No cervical adenopathy.     Right cervical: No superficial, deep or posterior cervical adenopathy.    Left cervical: No superficial, deep or posterior cervical adenopathy.     Upper Body:     Right upper body: No supraclavicular or axillary adenopathy.     Left upper body: No supraclavicular or axillary adenopathy.  Neurological:     General: No focal deficit present.     Mental Status: She is alert and oriented to person, place, and time.  Psychiatric:        Mood and Affect: Mood normal.        Behavior: Behavior normal.     LABS:   CBC     Component Value Date/Time   WBC 5.2 01/08/2024 0822   RBC 3.93 01/08/2024 0822   HGB 12.8 01/08/2024 0822   HGB 12.3 03/03/2022 1618   HCT 38.0 01/08/2024 0822   HCT 35.4 03/03/2022 1618   PLT 236 01/08/2024 0822   PLT 351 03/03/2022 1618   MCV 96.7 01/08/2024 0822   MCV 91 03/03/2022 1618   MCH 32.6 01/08/2024 0822   MCHC 33.7 01/08/2024 0822  RDW 14.2 01/08/2024 0822   RDW 12.1 03/03/2022 1618   LYMPHSABS 0.8 01/08/2024 0822   LYMPHSABS 3.7 (H) 03/03/2022 1618   MONOABS 0.3 01/08/2024 0822   EOSABS 0.3 01/08/2024 0822   EOSABS 0.9 (H) 03/03/2022 1618   BASOSABS 0.0 01/08/2024 0822   BASOSABS 0.1 03/03/2022 1618    CMP      Component Value Date/Time   NA 137 01/08/2024 0822   NA 144 03/03/2022 1618   K 3.8 01/08/2024 0822   CL 102 01/08/2024 0822   CO2 25 01/08/2024 0822   GLUCOSE 107 (H) 01/08/2024 0822   BUN 9 01/08/2024  0822   BUN 10 03/03/2022 1618   CREATININE 0.69 01/08/2024 0822   CALCIUM  9.4 01/08/2024 0822   PROT 6.4 (L) 01/08/2024 0822   PROT 5.8 (L) 03/03/2022 1618   ALBUMIN 3.7 01/08/2024 0822   ALBUMIN 4.0 03/03/2022 1618   AST 18 01/08/2024 0822   ALT 15 01/08/2024 0822   ALKPHOS 71 01/08/2024 0822   BILITOT 0.6 01/08/2024 0822   BILITOT <0.2 03/03/2022 1618   GFRNONAA >60 01/08/2024 0822   GFRAA 97 07/16/2020 1604     Lab Results  Component Value Date   CEA1 24.7 (H) 12/11/2023   /  CEA  Date Value Ref Range Status  12/11/2023 24.7 (H) 0.0 - 4.7 ng/mL Final    Comment:    (NOTE)                             Nonsmokers          <3.9                             Smokers             <5.6 [employer] Electrochemiluminescence Immunoassay (ECLIA) Values obtained with different assay methods or kits cannot be used interchangeably.  Results cannot be interpreted as absolute evidence of the presence or absence of malignant disease. 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 Metastatic rectal cancer. Restaging. Status post low anterior resection with ileostomy reversal on 11/07/2023. * Tracking Code: BO * EXAM: CT CHEST, ABDOMEN, AND PELVIS WITH CONTRAST TECHNIQUE: Multidetector CT imaging of the chest, abdomen and pelvis was performed following the standard protocol during bolus administration of intravenous contrast. RADIATION DOSE REDUCTION: This exam was performed according to the departmental  dose-optimization program which includes automated exposure control, adjustment of the mA and/or kV according to patient size and/or use of iterative reconstruction technique. CONTRAST:  OMNIPAQUE  IOHEXOL  300 MG/ML  SOLN COMPARISON:  10/01/2023 FINDINGS: CT CHEST FINDINGS Cardiovascular: The heart size is normal. No substantial pericardial effusion. Right Port-A-Cath tip is positioned in the proximal SVC. Mediastinum/Nodes: No mediastinal lymphadenopathy. There is no hilar lymphadenopathy. The esophagus has normal imaging features. There is no axillary lymphadenopathy. Lungs/Pleura: Cavitary nodule noted lateral right lung measured previously at 12 x 11 mm is now 13 x 10 mm with interval wall thickening and development  of the posterior mural nodule. 3 mm nodule anterior left upper lobe previously is now 6 mm on image 46/6 and qualitatively progressive in the interval. Dependent atelectasis in the lung bases. No pleural effusion. Musculoskeletal: No worrisome lytic or sclerotic osseous abnormality. CT ABDOMEN PELVIS FINDINGS Hepatobiliary: Multiple liver metastases again noted. Dominant left hepatic lobe lesion is progressive measuring 5.2 x 5.1 cm today compared to 3.5 x 3.0 cm previously. Index lesion posterior right liver is also progressive measuring 1.8 x 1.7 cm today compared to 1.6 x 1.4 cm previously. There is no evidence for gallstones, gallbladder wall thickening, or pericholecystic fluid. No intrahepatic or extrahepatic biliary dilation. Pancreas: No focal mass lesion. No dilatation of the main duct. No intraparenchymal cyst. No peripancreatic edema. Spleen: Calcified granulomata again noted in the spleen. Adrenals/Urinary Tract: No adrenal nodule or mass. Kidneys unremarkable. No evidence for hydroureter. The urinary bladder appears normal for the degree of distention. No bladder wall thickening. No gas within the bladder lumen. There is some mass-effect on the right bladder dome by the cecum, but  no wall thickening in either the cecum or the urinary bladder in this region. Stomach/Bowel: Stomach is unremarkable. No gastric wall thickening. No evidence of outlet obstruction. Duodenum is normally positioned as is the ligament of Treitz. No small bowel wall thickening. No small bowel dilatation. The terminal ileum is normal. The appendix is not well visualized, but there is no edema or inflammation in the region of the cecal tip to suggest appendicitis. No gross colonic mass. No colonic wall thickening. Small bowel anastomosis identified in the central pelvis. Rectal anastomosis again noted. Presacral soft tissue is similar to prior likely treatment related. The chronic relatively thick-walled presacral cavity containing gas measures minimally smaller at 3.2 x 1.3 cm today compared to 3.6 x 2.0 cm previously and compatible sequelae of previous surgery and subsequent reported anastomotic leak. Vascular/Lymphatic: No abdominal aortic aneurysm. No abdominal aortic atherosclerotic calcification. There is no gastrohepatic or hepatoduodenal ligament lymphadenopathy. No retroperitoneal or mesenteric lymphadenopathy. No pelvic sidewall lymphadenopathy. Reproductive: Uterus is surgically absent. There is no adnexal mass. Fat plane between the posterior wall of the vagina and the low rectum is obscured. There is no visible gas within the vagina although there does appear to be complex fluid in the vagina on image 118/2. This fluid extends in a curvilinear finger-like projection posteriorly at the midline towards the expected location of the low rectum and could represent a rectal vaginal fistula. Appearance also noted on sagittal image 96 of series 10. Other: No intraperitoneal free fluid. Musculoskeletal: No worrisome lytic or sclerotic osseous abnormality. IMPRESSION: 1. Fat plane between the posterior wall of the vagina and the low rectum is obscured. There is no visible gas within the vagina although there does  appear to be complex fluid in the vagina. This fluid extends in a curvilinear finger-like projection posteriorly at the midline towards the expected location of the low rectum and could represent a rectal vaginal fistula. 2. Progressive liver metastases. 3. Cavitary nodule lateral right lung shows interval wall thickening and development of a posterior mural nodule. 3 mm nodule anterior left upper lobe previously is now 6 mm and qualitatively progressive in the interval. Features are concerning for progression of pulmonary metastases. 4. Presacral soft tissue is similar to prior and likely treatment related. The chronic relatively thick-walled presacral cavity containing gas measures minimally smaller at 3.2 x 1.3 cm today compared to 3.6 x 2.0 cm previously and is compatible sequelae of previous  surgery and subsequent reported anastomotic leak. Electronically Signed   By: Camellia Candle M.D.   On: 12/30/2023 06:49

## 2024-01-08 ENCOUNTER — Inpatient Hospital Stay: Payer: BC Managed Care – PPO

## 2024-01-08 ENCOUNTER — Encounter (HOSPITAL_COMMUNITY): Payer: Self-pay | Admitting: Hematology

## 2024-01-08 ENCOUNTER — Inpatient Hospital Stay: Payer: BC Managed Care – PPO | Attending: Hematology | Admitting: Hematology

## 2024-01-08 VITALS — BP 107/59 | HR 59 | Temp 97.5°F | Resp 18 | Wt 166.0 lb

## 2024-01-08 VITALS — BP 127/67 | HR 81 | Temp 97.0°F

## 2024-01-08 DIAGNOSIS — Z5111 Encounter for antineoplastic chemotherapy: Secondary | ICD-10-CM | POA: Insufficient documentation

## 2024-01-08 DIAGNOSIS — Z803 Family history of malignant neoplasm of breast: Secondary | ICD-10-CM | POA: Diagnosis not present

## 2024-01-08 DIAGNOSIS — Z800 Family history of malignant neoplasm of digestive organs: Secondary | ICD-10-CM | POA: Diagnosis not present

## 2024-01-08 DIAGNOSIS — C20 Malignant neoplasm of rectum: Secondary | ICD-10-CM

## 2024-01-08 DIAGNOSIS — C787 Secondary malignant neoplasm of liver and intrahepatic bile duct: Secondary | ICD-10-CM | POA: Insufficient documentation

## 2024-01-08 DIAGNOSIS — Z806 Family history of leukemia: Secondary | ICD-10-CM | POA: Insufficient documentation

## 2024-01-08 DIAGNOSIS — Z95828 Presence of other vascular implants and grafts: Secondary | ICD-10-CM

## 2024-01-08 DIAGNOSIS — R918 Other nonspecific abnormal finding of lung field: Secondary | ICD-10-CM | POA: Diagnosis not present

## 2024-01-08 DIAGNOSIS — G629 Polyneuropathy, unspecified: Secondary | ICD-10-CM | POA: Diagnosis not present

## 2024-01-08 DIAGNOSIS — N823 Fistula of vagina to large intestine: Secondary | ICD-10-CM | POA: Diagnosis not present

## 2024-01-08 DIAGNOSIS — C19 Malignant neoplasm of rectosigmoid junction: Secondary | ICD-10-CM | POA: Diagnosis not present

## 2024-01-08 DIAGNOSIS — K921 Melena: Secondary | ICD-10-CM | POA: Insufficient documentation

## 2024-01-08 DIAGNOSIS — F17210 Nicotine dependence, cigarettes, uncomplicated: Secondary | ICD-10-CM | POA: Diagnosis not present

## 2024-01-08 LAB — COMPREHENSIVE METABOLIC PANEL
ALT: 15 U/L (ref 0–44)
AST: 18 U/L (ref 15–41)
Albumin: 3.7 g/dL (ref 3.5–5.0)
Alkaline Phosphatase: 71 U/L (ref 38–126)
Anion gap: 10 (ref 5–15)
BUN: 9 mg/dL (ref 6–20)
CO2: 25 mmol/L (ref 22–32)
Calcium: 9.4 mg/dL (ref 8.9–10.3)
Chloride: 102 mmol/L (ref 98–111)
Creatinine, Ser: 0.69 mg/dL (ref 0.44–1.00)
GFR, Estimated: >60 mL/min (ref >60)
Glucose, Bld: 107 mg/dL — ABNORMAL HIGH (ref 70–99)
Potassium: 3.8 mmol/L (ref 3.5–5.1)
Sodium: 137 mmol/L (ref 135–145)
Total Bilirubin: 0.6 mg/dL (ref 0.0–1.2)
Total Protein: 6.4 g/dL — ABNORMAL LOW (ref 6.5–8.1)

## 2024-01-08 LAB — CBC WITH DIFFERENTIAL/PLATELET
Abs Immature Granulocytes: 0.01 10*3/uL (ref 0.00–0.07)
Basophils Absolute: 0 10*3/uL (ref 0.0–0.1)
Basophils Relative: 1 %
Eosinophils Absolute: 0.3 10*3/uL (ref 0.0–0.5)
Eosinophils Relative: 5 %
HCT: 38 % (ref 36.0–46.0)
Hemoglobin: 12.8 g/dL (ref 12.0–15.0)
Immature Granulocytes: 0 %
Lymphocytes Relative: 15 %
Lymphs Abs: 0.8 10*3/uL (ref 0.7–4.0)
MCH: 32.6 pg (ref 26.0–34.0)
MCHC: 33.7 g/dL (ref 30.0–36.0)
MCV: 96.7 fL (ref 80.0–100.0)
Monocytes Absolute: 0.3 10*3/uL (ref 0.1–1.0)
Monocytes Relative: 5 %
Neutro Abs: 3.9 10*3/uL (ref 1.7–7.7)
Neutrophils Relative %: 74 %
Platelets: 236 10*3/uL (ref 150–400)
RBC: 3.93 MIL/uL (ref 3.87–5.11)
RDW: 14.2 % (ref 11.5–15.5)
WBC: 5.2 10*3/uL (ref 4.0–10.5)
nRBC: 0 % (ref 0.0–0.2)

## 2024-01-08 LAB — URINALYSIS, DIPSTICK ONLY
Bilirubin Urine: NEGATIVE
Glucose, UA: NEGATIVE mg/dL
Hgb urine dipstick: NEGATIVE
Ketones, ur: NEGATIVE mg/dL
Leukocytes,Ua: NEGATIVE
Nitrite: NEGATIVE
Protein, ur: NEGATIVE mg/dL
Specific Gravity, Urine: 1.013 (ref 1.005–1.030)
pH: 6 (ref 5.0–8.0)

## 2024-01-08 LAB — MAGNESIUM: Magnesium: 2.1 mg/dL (ref 1.7–2.4)

## 2024-01-08 MED ORDER — SODIUM CHLORIDE 0.9 % IV SOLN
180.0000 mg/m2 | Freq: Once | INTRAVENOUS | Status: AC
  Administered 2024-01-08: 340 mg via INTRAVENOUS
  Filled 2024-01-08: qty 17

## 2024-01-08 MED ORDER — SODIUM CHLORIDE 0.9 % IV SOLN: Freq: Once | INTRAVENOUS | Status: AC

## 2024-01-08 MED ORDER — ATROPINE SULFATE 1 MG/ML IV SOLN
0.5000 mg | Freq: Once | INTRAVENOUS | Status: AC
Start: 2024-01-08 — End: 2024-01-08
  Administered 2024-01-08: 0.5 mg via INTRAVENOUS
  Filled 2024-01-08: qty 1

## 2024-01-08 MED ORDER — SODIUM CHLORIDE 0.9 % IV SOLN
400.0000 mg/m2 | Freq: Once | INTRAVENOUS | Status: AC
  Administered 2024-01-08: 748 mg via INTRAVENOUS
  Filled 2024-01-08: qty 37.4

## 2024-01-08 MED ORDER — FLUOROURACIL CHEMO INJECTION 2.5 GM/50ML
400.0000 mg/m2 | Freq: Once | INTRAVENOUS | Status: AC
  Administered 2024-01-08: 750 mg via INTRAVENOUS
  Filled 2024-01-08: qty 15

## 2024-01-08 MED ORDER — PALONOSETRON HCL INJECTION 0.25 MG/5ML
0.2500 mg | Freq: Once | INTRAVENOUS | Status: AC
  Administered 2024-01-08: 0.25 mg via INTRAVENOUS
  Filled 2024-01-08: qty 5

## 2024-01-08 MED ORDER — DEXAMETHASONE SODIUM PHOSPHATE 10 MG/ML IJ SOLN
10.0000 mg | Freq: Once | INTRAMUSCULAR | Status: AC
  Administered 2024-01-08: 10 mg via INTRAVENOUS
  Filled 2024-01-08: qty 1

## 2024-01-08 MED ORDER — SODIUM CHLORIDE 0.9 % IV SOLN
2400.0000 mg/m2 | INTRAVENOUS | Status: DC
  Administered 2024-01-08: 4500 mg via INTRAVENOUS
  Filled 2024-01-08: qty 90

## 2024-01-08 MED ORDER — SODIUM CHLORIDE 0.9% FLUSH
10.0000 mL | INTRAVENOUS | Status: DC | PRN
  Administered 2024-01-08: 10 mL via INTRAVENOUS

## 2024-01-08 NOTE — Patient Instructions (Signed)
 [facility] - A DEPT OF [facility]  Discharge Instructions: Thank you for choosing [facility] to provide your oncology and hematology care.  If you have a lab appointment with the Cancer Center - please note that after April 8th, 2024, all labs will be drawn in the cancer center.  You do not have to check in or register with the main entrance as you have in the past but will complete your check-in in the cancer center.  Wear comfortable clothing and clothing appropriate for easy access to any Portacath or PICC line.   We strive to give you quality time with your provider. You may need to reschedule your appointment if you arrive late (15 or more minutes).  Arriving late affects you and other patients whose appointments are after yours.  Also, if you miss three or more appointments without notifying the office, you may be dismissed from the clinic at the provider's discretion.      For prescription refill requests, have your pharmacy contact our office and allow 72 hours for refills to be completed.    Today you received the following chemotherapy and/or immunotherapy agents Irinotecan /Leucovorin /Fluorouracil .  Irinotecan  Injection What is this medication? IRINOTECAN  (ir in oh TEE kan) treats some types of cancer. It works by slowing down the growth of cancer cells. This medicine may be used for other purposes; ask your health care provider or pharmacist if you have questions. COMMON BRAND NAME(S): Camptosar  What should I tell my care team before I take this medication? They need to know if you have any of these conditions: Dehydration Diarrhea Infection, especially a viral infection, such as chickenpox, cold sores, herpes Liver disease Low blood cell levels (white cells, red cells, and platelets) Low levels of electrolytes, such as calcium , magnesium, or potassium in your blood Recent or ongoing radiation An unusual or allergic reaction to  irinotecan , other medications, foods, dyes, or preservatives If you or your partner are pregnant or trying to get pregnant Breast-feeding How should I use this medication? This medication is injected into a vein. It is given by your care team in a hospital or clinic setting. Talk to your care team about the use of this medication in children. Special care may be needed. Overdosage: If you think you have taken too much of this medicine contact a poison control center or emergency room at once. NOTE: This medicine is only for you. Do not share this medicine with others. What if I miss a dose? Keep appointments for follow-up doses. It is important not to miss your dose. Call your care team if you are unable to keep an appointment. What may interact with this medication? Do not take this medication with any of the following: Cobicistat Itraconazole This medication may also interact with the following: Certain antibiotics, such as clarithromycin, rifampin, rifabutin Certain antivirals for HIV or AIDS Certain medications for fungal infections, such as ketoconazole, posaconazole, voriconazole Certain medications for seizures, such as carbamazepine, phenobarbital, phenytoin Gemfibrozil Nefazodone St. John's wort This list may not describe all possible interactions. Give your health care provider a list of all the medicines, herbs, non-prescription drugs, or dietary supplements you use. Also tell them if you smoke, drink alcohol, or use illegal drugs. Some items may interact with your medicine. What should I watch for while using this medication? Your condition will be monitored carefully while you are receiving this medication. You may need blood work while taking this medication. This  medication may make you feel generally unwell. This is not uncommon as chemotherapy can affect healthy cells as well as cancer cells. Report any side effects. Continue your course of treatment even though you feel ill  unless your care team tells you to stop. This medication can cause serious side effects. To reduce the risk, your care team may give you other medications to take before receiving this one. Be sure to follow the directions from your care team. This medication may affect your coordination, reaction time, or judgement. Do not drive or operate machinery until you know how this medication affects you. Sit up or stand slowly to reduce the risk of dizzy or fainting spells. Drinking alcohol with this medication can increase the risk of these side effects. This medication may increase your risk of getting an infection. Call your care team for advice if you get a fever, chills, sore throat, or other symptoms of a cold or flu. Do not treat yourself. Try to avoid being around people who are sick. Avoid taking medications that contain aspirin, acetaminophen , ibuprofen, naproxen, or ketoprofen unless instructed by your care team. These medications may hide a fever. This medication may increase your risk to bruise or bleed. Call your care team if you notice any unusual bleeding. Be careful brushing or flossing your teeth or using a toothpick because you may get an infection or bleed more easily. If you have any dental work done, tell your dentist you are receiving this medication. Talk to your care team if you or your partner are pregnant or think either of you might be pregnant. This medication can cause serious birth defects if taken during pregnancy and for 6 months after the last dose. You will need a negative pregnancy test before starting this medication. Contraception is recommended while taking this medication and for 6 months after the last dose. Your care team can help you find the option that works for you. Do not father a child while taking this medication and for 3 months after the last dose. Use a condom for contraception during this time period. Do not breastfeed while taking this medication and for 7 days  after the last dose. This medication may cause infertility. Talk to your care team if you are concerned about your fertility. What side effects may I notice from receiving this medication? Side effects that you should report to your care team as soon as possible: Allergic reactions--skin rash, itching, hives, swelling of the face, lips, tongue, or throat Dry cough, shortness of breath or trouble breathing Increased saliva or tears, increased sweating, stomach cramping, diarrhea, small pupils, unusual weakness or fatigue, slow heartbeat Infection--fever, chills, cough, sore throat, wounds that don't heal, pain or trouble when passing urine, general feeling of discomfort or being unwell Kidney injury--decrease in the amount of urine, swelling of the ankles, hands, or feet Low red blood cell level--unusual weakness or fatigue, dizziness, headache, trouble breathing Severe or prolonged diarrhea Unusual bruising or bleeding Side effects that usually do not require medical attention (report to your care team if they continue or are bothersome): Constipation Diarrhea Hair loss Loss of appetite Nausea Stomach pain This list may not describe all possible side effects. Call your doctor for medical advice about side effects. You may report side effects to FDA at 1-800-FDA-1088. Where should I keep my medication? This medication is given in a hospital or clinic. It will not be stored at home. NOTE: This sheet is a summary. It may not cover all  possible information. If you have questions about this medicine, talk to your doctor, pharmacist, or health care provider.  2024 Elsevier/Gold Standard (2022-04-03 00:00:00)    Leucovorin  Injection What is this medication? LEUCOVORIN  (loo koe VOR in) prevents side effects from certain medications, such as methotrexate. It works by increasing folate levels. This helps protect healthy cells in your body. It may also be used to treat anemia caused by low levels  of folate. It can also be used with fluorouracil , a type of chemotherapy, to treat colorectal cancer. It works by increasing the effects of fluorouracil  in the body. This medicine may be used for other purposes; ask your health care provider or pharmacist if you have questions. What should I tell my care team before I take this medication? They need to know if you have any of these conditions: Anemia from low levels of vitamin B12 in the blood An unusual or allergic reaction to leucovorin , folic acid, other medications, foods, dyes, or preservatives Pregnant or trying to get pregnant Breastfeeding How should I use this medication? This medication is injected into a vein or a muscle. It is given by your care team in a hospital or clinic setting. Talk to your care team about the use of this medication in children. Special care may be needed. Overdosage: If you think you have taken too much of this medicine contact a poison control center or emergency room at once. NOTE: This medicine is only for you. Do not share this medicine with others. What if I miss a dose? Keep appointments for follow-up doses. It is important not to miss your dose. Call your care team if you are unable to keep an appointment. What may interact with this medication? Capecitabine  Fluorouracil  Phenobarbital Phenytoin Primidone Trimethoprim;sulfamethoxazole This list may not describe all possible interactions. Give your health care provider a list of all the medicines, herbs, non-prescription drugs, or dietary supplements you use. Also tell them if you smoke, drink alcohol, or use illegal drugs. Some items may interact with your medicine. What should I watch for while using this medication? Your condition will be monitored carefully while you are receiving this medication. This medication may increase the side effects of 5-fluorouracil . Tell your care team if you have diarrhea or mouth sores that do not get better or that  get worse. What side effects may I notice from receiving this medication? Side effects that you should report to your care team as soon as possible: Allergic reactions--skin rash, itching, hives, swelling of the face, lips, tongue, or throat This list may not describe all possible side effects. Call your doctor for medical advice about side effects. You may report side effects to FDA at 1-800-FDA-1088. Where should I keep my medication? This medication is given in a hospital or clinic. It will not be stored at home. NOTE: This sheet is a summary. It may not cover all possible information. If you have questions about this medicine, talk to your doctor, pharmacist, or health care provider.  2024 Elsevier/Gold Standard (2022-04-25 00:00:00)    Fluorouracil  Injection What is this medication? FLUOROURACIL  (flure oh YOOR a sil) treats some types of cancer. It works by slowing down the growth of cancer cells. This medicine may be used for other purposes; ask your health care provider or pharmacist if you have questions. COMMON BRAND NAME(S): Adrucil  What should I tell my care team before I take this medication? They need to know if you have any of these conditions: Blood disorders Dihydropyrimidine dehydrogenase (  DPD) deficiency Infection, such as chickenpox, cold sores, herpes Kidney disease Liver disease Poor nutrition Recent or ongoing radiation therapy An unusual or allergic reaction to fluorouracil , other medications, foods, dyes, or preservatives If you or your partner are pregnant or trying to get pregnant Breast-feeding How should I use this medication? This medication is injected into a vein. It is administered by your care team in a hospital or clinic setting. Talk to your care team about the use of this medication in children. Special care may be needed. Overdosage: If you think you have taken too much of this medicine contact a poison control center or emergency room at  once. NOTE: This medicine is only for you. Do not share this medicine with others. What if I miss a dose? Keep appointments for follow-up doses. It is important not to miss your dose. Call your care team if you are unable to keep an appointment. What may interact with this medication? Do not take this medication with any of the following: Live virus vaccines This medication may also interact with the following: Medications that treat or prevent blood clots, such as warfarin, enoxaparin , dalteparin This list may not describe all possible interactions. Give your health care provider a list of all the medicines, herbs, non-prescription drugs, or dietary supplements you use. Also tell them if you smoke, drink alcohol, or use illegal drugs. Some items may interact with your medicine. What should I watch for while using this medication? Your condition will be monitored carefully while you are receiving this medication. This medication may make you feel generally unwell. This is not uncommon as chemotherapy can affect healthy cells as well as cancer cells. Report any side effects. Continue your course of treatment even though you feel ill unless your care team tells you to stop. In some cases, you may be given additional medications to help with side effects. Follow all directions for their use. This medication may increase your risk of getting an infection. Call your care team for advice if you get a fever, chills, sore throat, or other symptoms of a cold or flu. Do not treat yourself. Try to avoid being around people who are sick. This medication may increase your risk to bruise or bleed. Call your care team if you notice any unusual bleeding. Be careful brushing or flossing your teeth or using a toothpick because you may get an infection or bleed more easily. If you have any dental work done, tell your dentist you are receiving this medication. Avoid taking medications that contain aspirin,  acetaminophen , ibuprofen, naproxen, or ketoprofen unless instructed by your care team. These medications may hide a fever. Do not treat diarrhea with over the counter products. Contact your care team if you have diarrhea that lasts more than 2 days or if it is severe and watery. This medication can make you more sensitive to the sun. Keep out of the sun. If you cannot avoid being in the sun, wear protective clothing and sunscreen. Do not use sun lamps, tanning beds, or tanning booths. Talk to your care team if you or your partner wish to become pregnant or think you might be pregnant. This medication can cause serious birth defects if taken during pregnancy and for 3 months after the last dose. A reliable form of contraception is recommended while taking this medication and for 3 months after the last dose. Talk to your care team about effective forms of contraception. Do not father a child while taking this medication and  months after the last dose. Use a condom while having sex during this time period. Do not breastfeed while taking this medication. This medication may cause infertility. Talk to your care team if you are concerned about your fertility. What side effects may I notice from receiving this medication? Side effects that you should report to your care team as soon as possible: Allergic reactions--skin rash, itching, hives, swelling of the face, lips, tongue, or throat Heart attack--pain or tightness in the chest, shoulders, arms, or jaw, nausea, shortness of breath, cold or clammy skin, feeling faint or lightheaded Heart failure--shortness of breath, swelling of the ankles, feet, or hands, sudden weight gain, unusual weakness or fatigue Heart rhythm changes--fast or irregular heartbeat, dizziness, feeling faint or lightheaded, chest pain, trouble breathing High ammonia level--unusual weakness or fatigue, confusion, loss of appetite, nausea, vomiting, seizures Infection--fever, chills, cough, sore throat,  wounds that don't heal, pain or trouble when passing urine, general feeling of discomfort or being unwell Low red blood cell level--unusual weakness or fatigue, dizziness, headache, trouble breathing Pain, tingling, or numbness in the hands or feet, muscle weakness, change in vision, confusion or trouble speaking, loss of balance or coordination, trouble walking, seizures Redness, swelling, and blistering of the skin over hands and feet Severe or prolonged diarrhea Unusual bruising or bleeding Side effects that usually do not require medical attention (report to your care team if they continue or are bothersome): Dry skin Headache Increased tears Nausea Pain, redness, or swelling with sores inside the mouth or throat Sensitivity to light Vomiting This list may not describe all possible side effects. Call your doctor for medical advice about side effects. You may report side effects to FDA at 1-800-FDA-1088. Where should I keep my medication? This medication is given in a hospital or clinic. It will not be stored at home. NOTE: This sheet is a summary. It may not cover all possible information. If you have questions about this medicine, talk to your doctor, pharmacist, or health care provider.  2024 Elsevier/Gold Standard (2022-03-28 00:00:00)       To help prevent nausea and vomiting after your treatment, we encourage you to take your nausea medication as directed.  BELOW ARE SYMPTOMS THAT SHOULD BE REPORTED IMMEDIATELY: *FEVER GREATER THAN 100.4 F (38 C) OR HIGHER *CHILLS OR SWEATING *NAUSEA AND VOMITING THAT IS NOT CONTROLLED WITH YOUR NAUSEA MEDICATION *UNUSUAL SHORTNESS OF BREATH *UNUSUAL BRUISING OR BLEEDING *URINARY PROBLEMS (pain or burning when urinating, or frequent urination) *BOWEL PROBLEMS (unusual diarrhea, constipation, pain near the anus) TENDERNESS IN MOUTH AND THROAT WITH OR WITHOUT PRESENCE OF ULCERS (sore throat, sores in mouth, or a toothache) UNUSUAL RASH,  SWELLING OR PAIN  UNUSUAL VAGINAL DISCHARGE OR ITCHING   Items with * indicate a potential emergency and should be followed up as soon as possible or go to the Emergency Department if any problems should occur.  Please show the CHEMOTHERAPY ALERT CARD or IMMUNOTHERAPY ALERT CARD at check-in to the Emergency Department and triage nurse.  Should you have questions after your visit or need to cancel or reschedule your appointment, please contact [facility] [place] - A DEPT OF [facility] [phone]  and follow the prompts.  Office hours are 8:00 a.m. to 4:30 p.m. Monday - Friday. Please note that voicemails left after 4:00 p.m. may not be returned until the following business day.  We are closed weekends and major holidays. You have access to a nurse at all times for urgent  times for urgent questions. Please call the main number to the clinic [phone] and follow the prompts.  For any non-urgent questions, you may also contact your provider using MyChart. We now offer e-Visits for anyone 42 and older to request care online for non-urgent symptoms. For details visit [email or web address].   Also download the MyChart app! Go to the app store, search MyChart, open the app, select [facility], and log in with your MyChart username and password.

## 2024-01-08 NOTE — Progress Notes (Signed)
 Patient has been examined by Dr. Ellin Saba. Vital signs and labs have been reviewed by MD - ANC, Creatinine, LFTs, hemoglobin, and platelets are within treatment parameters per M.D. - pt may proceed with treatment.  Primary RN and pharmacy notified.

## 2024-01-08 NOTE — Progress Notes (Signed)
 Patients port flushed without difficulty.  Good blood return noted with no bruising or swelling noted at site.  Patient remains accessed for treatment.

## 2024-01-08 NOTE — Progress Notes (Signed)
MD confirmed full dose chemotherapy today.  V.O. Dr Carilyn Goodpasture, PharmD

## 2024-01-08 NOTE — Progress Notes (Signed)
 Patient presents today for chemotherapy infusion. Patient is in satisfactory condition with no new complaints voiced.  Vital signs are stable.  Labs reviewed by Dr. Rogers during the office visit and all labs are within treatment parameters.  Urine protein is negative.  We will proceed with treatment per MD orders.   Patient tolerated treatment well with no complaints voiced.  Home infusion 5FU pump connected.  Patient left ambulatory in stable condition.  Vital signs stable at discharge.  Follow up as scheduled.

## 2024-01-08 NOTE — Patient Instructions (Signed)
 [facility] at [facility] Discharge Instructions   You were seen and examined today by Dr. Rogers.  He reviewed the results of your lab work which are normal/stable.   He reviewed the results of your CT scan that was done in the ER. It is showing that the spots on the liver have grown in size.   We will proceed with your treatment today.   Return as scheduled.    Thank you for choosing [facility] at [facility] to provide your oncology and hematology care.  To afford each patient quality time with our provider, please arrive at least 15 minutes before your scheduled appointment time.   If you have a lab appointment with the Cancer Center please come in thru the Main Entrance and check in at the main information desk.  You need to re-schedule your appointment should you arrive 10 or more minutes late.  We strive to give you quality time with our providers, and arriving late affects you and other patients whose appointments are after yours.  Also, if you no show three or more times for appointments you may be dismissed from the clinic at the providers discretion.     Again, thank you for choosing [facility].  Our hope is that these requests will decrease the amount of time that you wait before being seen by our physicians.       _____________________________________________________________  Should you have questions after your visit to [facility], please contact our office at [phone] and follow the prompts.  Our office hours are 8:00 a.m. and 4:30 p.m. Monday - Friday.  Please note that voicemails left after 4:00 p.m. may not be returned until the following business day.  We are closed weekends and major holidays.  You do have access to a nurse 24-7, just call the main number to the clinic [phone] and do not press any options, hold on the line and a nurse will answer the phone.    For prescription refill  requests, have your pharmacy contact our office and allow 72 hours.    Due to Covid, you will need to wear a mask upon entering the hospital. If you do not have a mask, a mask will be given to you at the Main Entrance upon arrival. For doctor visits, patients may have 1 support person age 25 or older with them. For treatment visits, patients can not have anyone with them due to social distancing guidelines and our immunocompromised population.

## 2024-01-09 ENCOUNTER — Ambulatory Visit: Payer: BC Managed Care – PPO | Admitting: Internal Medicine

## 2024-01-09 ENCOUNTER — Ambulatory Visit (HOSPITAL_COMMUNITY)
Admission: RE | Admit: 2024-01-09 | Discharge: 2024-01-09 | Disposition: A | Discharge status: Home / Self Care | Payer: BC Managed Care – PPO | Source: Ambulatory Visit | Attending: Gynecologic Oncology | Admitting: Gynecologic Oncology

## 2024-01-09 DIAGNOSIS — N824 Other female intestinal-genital tract fistulae: Secondary | ICD-10-CM | POA: Diagnosis present

## 2024-01-09 MED ORDER — IOHEXOL 300 MG/ML  SOLN
30.0000 mL | Freq: Once | INTRAMUSCULAR | Status: AC | PRN
  Administered 2024-01-09: 30 mL

## 2024-01-09 MED ORDER — IOHEXOL 300 MG/ML  SOLN
100.0000 mL | Freq: Once | INTRAMUSCULAR | Status: AC | PRN
  Administered 2024-01-09: 100 mL via INTRAVENOUS

## 2024-01-10 ENCOUNTER — Ambulatory Visit: Payer: BC Managed Care – PPO | Admitting: Internal Medicine

## 2024-01-10 ENCOUNTER — Encounter: Payer: Self-pay | Admitting: *Deleted

## 2024-01-10 ENCOUNTER — Inpatient Hospital Stay: Payer: BC Managed Care – PPO

## 2024-01-10 ENCOUNTER — Encounter: Payer: Self-pay | Admitting: Internal Medicine

## 2024-01-10 VITALS — BP 115/72 | HR 95 | Temp 97.4°F | Resp 20

## 2024-01-10 VITALS — BP 111/72 | HR 93 | Temp 98.2°F | Ht 68.0 in | Wt 165.2 lb

## 2024-01-10 DIAGNOSIS — N824 Other female intestinal-genital tract fistulae: Secondary | ICD-10-CM

## 2024-01-10 DIAGNOSIS — C20 Malignant neoplasm of rectum: Secondary | ICD-10-CM | POA: Diagnosis not present

## 2024-01-10 DIAGNOSIS — Z95828 Presence of other vascular implants and grafts: Secondary | ICD-10-CM

## 2024-01-10 DIAGNOSIS — C787 Secondary malignant neoplasm of liver and intrahepatic bile duct: Secondary | ICD-10-CM | POA: Diagnosis not present

## 2024-01-10 DIAGNOSIS — Z5111 Encounter for antineoplastic chemotherapy: Secondary | ICD-10-CM | POA: Diagnosis not present

## 2024-01-10 MED ORDER — SODIUM CHLORIDE 0.9% FLUSH
10.0000 mL | INTRAVENOUS | Status: DC | PRN
  Administered 2024-01-10: 10 mL

## 2024-01-10 MED ORDER — HEPARIN SOD (PORK) LOCK FLUSH 100 UNIT/ML IV SOLN
500.0000 [IU] | Freq: Once | INTRAVENOUS | Status: AC | PRN
  Administered 2024-01-10: 500 [IU]

## 2024-01-10 NOTE — H&P (View-Only) (Signed)
 Referring Provider: Lavell Bari LABOR, FNP Primary Care Physician:  Lavell Bari LABOR, FNP Primary GI:  Dr. Cindie  Chief Complaint  Patient presents with   New Patient (Initial Visit)    Pt referred for fistula    HPI:   Terri Mckinney is a 43 y.o. female who presents to clinic today for follow-up visit.  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  Initially seen for 623 by urgent referral for abdominal pain, diarrhea, abnormal CT sigmoid colon/rectum.  Colonoscopy 03/21/2022 with nearly obstructing mass at the rectosigmoid colon.  Biopsies confirmatory of adenocarcinoma.  She underwent a diverting loop colostomy and port placement in July 2023. Patient also had bilobar liver tumors. She underwent complete neoadjuvant chemotherapy and radiation throughout the fall and winter 2023.   Underwent low anterior resection and hysterectomy for T4 disease. Anastomosis was completed with diverting ileostomy in place in early May 2024. S  She was given several cycles of this and then stopped her chemo to perform ileostomy reversal. She is now recovering from this.   Has undergone chemotherapy as well as radiation therapy.  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  Describes stool through her vaginal vault.  This was apparently confirmed on physical exam with her gynecologist as well.  Met with colorectal surgery Dr. Debby as well as Dr. Olam Mill.  CT pelvis completed though not officially read.  Presented today to discuss further evaluation with colonoscopy.  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   Port-A-Cath in place 05/24/2022   upper Rt chest   Tobacco abuse     Past Surgical History:  Procedure Laterality Date   BIOPSY  03/21/2022   Procedure: BIOPSY;  Surgeon: Cindie Carlin POUR, DO;  [facility];  Service: Endoscopy;;   COLONOSCOPY WITH PROPOFOL  N/A 03/21/2022   Procedure: COLONOSCOPY WITH PROPOFOL ;  Surgeon: Cindie Carlin POUR, DO;  [facility];  Service: Endoscopy;  Laterality: N/A;  8:30am   FLEXIBLE SIGMOIDOSCOPY N/A 05/19/2022   Procedure: FLEXIBLE SIGMOIDOSCOPY WITH TATTOO INJECTION;  Surgeon: Debby Hila, MD;  [facility]: [facility];  Service: General;  Laterality: N/A;   ILEOSTOMY N/A 04/04/2023   Procedure: DIVERTING ILEOSTOMY;  Surgeon: Debby Hila, MD;  [facility]: [facility];  Service: General;  Laterality: N/A;   ILEOSTOMY CLOSURE N/A 11/07/2023   Procedure: DIVERTING LOOP ILEOSTOMY TAKEDOWN;  Surgeon: Debby Hila, MD;  [facility];  Service: General;  Laterality: N/A;   LAPAROSCOPIC DIVERTED COLOSTOMY N/A 05/19/2022   Procedure: LAPAROSCOPIC DIVERTED OSTOMY;  Surgeon: Debby Hila, MD;  [facility];  Service: General;  Laterality: N/A;   PORTACATH PLACEMENT Right 05/19/2022   Procedure: INSERTION PORT-A-CATH;  Surgeon: Debby Hila, MD;  [facility];  Service: General;  Laterality: Right;   ROBOTIC ASSISTED TOTAL HYSTERECTOMY WITH BILATERAL SALPINGO OOPHERECTOMY N/A 04/04/2023   Procedure: XI ROBOTIC ASSISTED TOTAL HYSTERECTOMY WITH BILATERAL SALPINGO OOPHORECTOMY;  Surgeon: Viktoria Comer SAUNDERS, MD;  [facility];  Service: Gynecology;  Laterality: N/A;   XI ROBOTIC ASSISTED LOWER ANTERIOR RESECTION N/A 04/04/2023   Procedure: XI ROBOTIC ASSISTED LOWER ANTERIOR RESECTION;  Surgeon: Debby Hila, MD;  [facility];  Service: General;  Laterality: N/A;    Current Outpatient Medications  Medication Sig Dispense Refill   estradiol  (ESTRACE  VAGINAL) 0.1 MG/GM vaginal  cream 1 week after surgery, begin to use cream nightly at bedtime for 2 weeks then 3 times a week after to assist with healing vagina. Use a fingertip size amount of cream on finger and place slightly past vaginal entrance. Do not use the applicator that comes with cream.  42.5 g 3   loperamide  (IMODIUM ) 2 MG capsule Take 1 capsule (2 mg total) by mouth 4 (four) times daily as needed for diarrhea or loose stools. 12 capsule 0   Multiple Vitamin (MULTIVITAMIN) tablet Take 1 tablet by mouth daily.     No current facility-administered medications for this visit.    Allergies as of 01/10/2024 - Review Complete 01/10/2024  Allergen Reaction Noted   Wellbutrin  [bupropion ]  06/10/2018   Tramadol  Rash 06/05/2022    Family History  Problem Relation Age of Onset   Heart disease Mother    Diabetes Mother    Hearing loss Father    Heart disease Father    Hyperlipidemia Father    Hypertension Father    Stroke Father    Arthritis Father    Diabetes Father    Learning disabilities Brother    Diabetes Maternal Grandmother    Heart disease Maternal Grandmother    Diabetes Maternal Grandfather    Diabetes Paternal Grandmother    Diabetes Paternal Grandfather    Leukemia Maternal Uncle        d. 55s   Colon cancer Paternal Aunt        dx 66s   Breast cancer Cousin 33   Lung cancer Maternal Aunt     Social History   Socioeconomic History   Marital status: Married    Spouse name: Not on file   Number of children: Not on file   Years of education: Not on file   Highest education level: Not on file  Occupational History   Not on file  Tobacco Use   Smoking status: Every Day    Current packs/day: 0.25    Average packs/day: 0.3 packs/day for 27.0 years (6.8 ttl pk-yrs)    Types: Cigarettes    Passive exposure: Current   Smokeless tobacco: Never  Vaping Use   Vaping status: Never Used  Substance and Sexual Activity   Alcohol use: No   Drug use: No   Sexual activity: Yes    Birth control/protection: Injection  Other Topics Concern   Not on file  Social History Narrative   Not on file   Social Drivers of Health   Financial Resource Strain: Not on file  Food Insecurity: No Food Insecurity (11/08/2023)   Hunger Vital Sign    Worried About  Running Out of Food in the Last Year: Never true    Ran Out of Food in the Last Year: Never true  Transportation Needs: No Transportation Needs (11/08/2023)   PRAPARE - Administrator, Civil Service (Medical): No    Lack of Transportation (Non-Medical): No  Physical Activity: Not on file  Stress: Not on file  Social Connections: Not on file    Subjective: Review of Systems  Constitutional:  Negative for chills and fever.  HENT:  Negative for congestion and hearing loss.   Eyes:  Negative for blurred vision and double vision.  Respiratory:  Negative for cough and shortness of breath.   Cardiovascular:  Negative for chest pain and palpitations.  Gastrointestinal:  Negative for abdominal pain, blood in stool, constipation, diarrhea, heartburn, melena and vomiting.  Genitourinary:  Negative for dysuria and urgency.  Musculoskeletal:  Negative for joint pain and myalgias.  Skin:  Negative for itching and rash.  Neurological:  Negative for dizziness and headaches.  Psychiatric/Behavioral:  Negative for depression. The patient is not nervous/anxious.      Objective: BP 111/72   Pulse 93   Temp 98.2 F (36.8 C)   Ht 5' 8 (1.727 m)   Wt 165 lb 3.2 oz (74.9 kg)   LMP 04/19/2022 (Within Weeks) Comment: urine preg.pending 05/19/22-irregular periods,some spotting with depo shot.  BMI 25.12 kg/m  Physical Exam Constitutional:      Appearance: Normal appearance.  HENT:     Head: Normocephalic and atraumatic.  Eyes:     Extraocular Movements: Extraocular movements intact.     Conjunctiva/sclera: Conjunctivae normal.  Cardiovascular:     Rate and Rhythm: Normal rate and regular rhythm.  Pulmonary:     Effort: Pulmonary effort is normal.     Breath sounds: Normal breath sounds.  Abdominal:     General: Bowel sounds are normal.     Palpations: Abdomen is soft.  Musculoskeletal:        General: No swelling. Normal range of motion.     Cervical back: Normal range of motion  and neck supple.  Skin:    General: Skin is warm and dry.     Coloration: Skin is not jaundiced.  Neurological:     General: No focal deficit present.     Mental Status: She is alert and oriented to person, place, and time.  Psychiatric:        Mood and Affect: Mood normal.        Behavior: Behavior normal.      Assessment: *Colovaginal fistula *Stage IV rectal adenocarcinoma to the liver  Plan: Will schedule for colonoscopy.The risks including infection, bleed, or perforation as well as benefits, limitations, alternatives and imponderables have been reviewed with the patient. Questions have been answered. All parties agreeable.  Further recommendations to follow. 01/10/2024 2:25 PM   Disclaimer: This note was dictated with voice recognition software. Similar sounding words can inadvertently be transcribed and may not be corrected upon review.

## 2024-01-10 NOTE — Progress Notes (Signed)
 Patient for chemotherapy pump disconnect with no complaints voiced.  Patients port flushed without difficulty.  Good blood return noted with no bruising or swelling noted at site.  Band aid applied.  VSS with discharge and left ambulatory with no s/s of distress noted.

## 2024-01-10 NOTE — Patient Instructions (Signed)
 We will schedule you for colonoscopy to be performed soon as possible.  It was very nice seeing you again today.  Dr. Mordechai April

## 2024-01-10 NOTE — Patient Instructions (Signed)

## 2024-01-10 NOTE — Progress Notes (Signed)
 Referring Provider: Junie Spencer, FNP Primary Care Physician:  Junie Spencer, FNP Primary GI:  Dr. Marletta Lor  Chief Complaint  Patient presents with   New Patient (Initial Visit)    Pt referred for fistula    HPI:   Terri Mckinney is a 43 y.o. female who presents to clinic today for follow-up visit.  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  Initially seen for 623 by urgent referral for abdominal pain, diarrhea, abnormal CT sigmoid colon/rectum.  Colonoscopy 03/21/2022 with nearly obstructing mass at the rectosigmoid colon.  Biopsies confirmatory of adenocarcinoma.  She underwent a diverting loop colostomy and port placement in July 2023. Patient also had bilobar liver tumors. She underwent complete neoadjuvant chemotherapy and radiation throughout the fall and winter 2023.   Underwent low anterior resection and hysterectomy for T4 disease. Anastomosis was completed with diverting ileostomy in place in early May 2024. S  She was given several cycles of this and then stopped her chemo to perform ileostomy reversal. She is now recovering from this.   Has undergone chemotherapy as well as radiation therapy.  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  Describes stool through her vaginal vault.  This was apparently confirmed on physical exam with her gynecologist as well.  Met with colorectal surgery Dr. Maisie Fus as well as Dr. Antionette Char.  CT pelvis completed though not officially read.  Presented today to discuss further evaluation with colonoscopy.  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   Port-A-Cath in place 05/24/2022   upper Rt chest   Tobacco abuse     Past Surgical History:  Procedure Laterality Date   BIOPSY  03/21/2022   Procedure: BIOPSY;  Surgeon: Lanelle Bal, DO;  [facility];  Service: Endoscopy;;   COLONOSCOPY WITH PROPOFOL N/A 03/21/2022   Procedure: COLONOSCOPY WITH PROPOFOL;  Surgeon: Lanelle Bal, DO;  [facility];  Service: Endoscopy;  Laterality: N/A;  8:30am   FLEXIBLE SIGMOIDOSCOPY N/A 05/19/2022   Procedure: FLEXIBLE SIGMOIDOSCOPY WITH TATTOO INJECTION;  Surgeon: Romie Levee, MD;  [facility];  Service: General;  Laterality: N/A;   ILEOSTOMY N/A 04/04/2023   Procedure: DIVERTING ILEOSTOMY;  Surgeon: Romie Levee, MD;  [facility];  Service: General;  Laterality: N/A;   ILEOSTOMY CLOSURE N/A 11/07/2023   Procedure: DIVERTING LOOP ILEOSTOMY TAKEDOWN;  Surgeon: Romie Levee, MD;  [facility];  Service: General;  Laterality: N/A;   LAPAROSCOPIC DIVERTED COLOSTOMY N/A 05/19/2022   Procedure: LAPAROSCOPIC DIVERTED OSTOMY;  Surgeon: Romie Levee, MD;  [facility];  Service: General;  Laterality: N/A;   PORTACATH PLACEMENT Right 05/19/2022   Procedure: INSERTION PORT-A-CATH;  Surgeon: Romie Levee, MD;  [facility];  Service: General;  Laterality: Right;   ROBOTIC ASSISTED TOTAL HYSTERECTOMY WITH BILATERAL SALPINGO OOPHERECTOMY N/A 04/04/2023   Procedure: XI ROBOTIC ASSISTED TOTAL HYSTERECTOMY WITH BILATERAL SALPINGO OOPHORECTOMY;  Surgeon: Otho Michalik Fila, MD;  [facility];  Service: Gynecology;  Laterality: N/A;   XI ROBOTIC ASSISTED LOWER ANTERIOR RESECTION N/A 04/04/2023   Procedure: XI ROBOTIC ASSISTED LOWER ANTERIOR RESECTION;  Surgeon: Romie Levee, MD;  [facility];  Service: General;  Laterality: N/A;    Current Outpatient Medications  Medication Sig Dispense Refill   estradiol (ESTRACE VAGINAL) 0.1 MG/GM vaginal  cream 1 week after surgery, begin to use cream nightly at bedtime for 2 weeks then 3 times a week after to assist with healing vagina. Use a fingertip size amount of cream on finger and place slightly past vaginal entrance. Do not use the applicator that comes with cream.  42.5 g 3   loperamide  (IMODIUM ) 2 MG capsule Take 1 capsule (2 mg total) by mouth 4 (four) times daily as needed for diarrhea or loose stools. 12 capsule 0   Multiple Vitamin (MULTIVITAMIN) tablet Take 1 tablet by mouth daily.     No current facility-administered medications for this visit.    Allergies as of 01/10/2024 - Review Complete 01/10/2024  Allergen Reaction Noted   Wellbutrin  [bupropion ]  06/10/2018   Tramadol  Rash 06/05/2022    Family History  Problem Relation Age of Onset   Heart disease Mother    Diabetes Mother    Hearing loss Father    Heart disease Father    Hyperlipidemia Father    Hypertension Father    Stroke Father    Arthritis Father    Diabetes Father    Learning disabilities Brother    Diabetes Maternal Grandmother    Heart disease Maternal Grandmother    Diabetes Maternal Grandfather    Diabetes Paternal Grandmother    Diabetes Paternal Grandfather    Leukemia Maternal Uncle        d. 55s   Colon cancer Paternal Aunt        dx 66s   Breast cancer Cousin 33   Lung cancer Maternal Aunt     Social History   Socioeconomic History   Marital status: Married    Spouse name: Not on file   Number of children: Not on file   Years of education: Not on file   Highest education level: Not on file  Occupational History   Not on file  Tobacco Use   Smoking status: Every Day    Current packs/day: 0.25    Average packs/day: 0.3 packs/day for 27.0 years (6.8 ttl pk-yrs)    Types: Cigarettes    Passive exposure: Current   Smokeless tobacco: Never  Vaping Use   Vaping status: Never Used  Substance and Sexual Activity   Alcohol use: No   Drug use: No   Sexual activity: Yes    Birth control/protection: Injection  Other Topics Concern   Not on file  Social History Narrative   Not on file   Social Drivers of Health   Financial Resource Strain: Not on file  Food Insecurity: No Food Insecurity (11/08/2023)   Hunger Vital Sign    Worried About  Running Out of Food in the Last Year: Never true    Ran Out of Food in the Last Year: Never true  Transportation Needs: No Transportation Needs (11/08/2023)   PRAPARE - Administrator, Civil Service (Medical): No    Lack of Transportation (Non-Medical): No  Physical Activity: Not on file  Stress: Not on file  Social Connections: Not on file    Subjective: Review of Systems  Constitutional:  Negative for chills and fever.  HENT:  Negative for congestion and hearing loss.   Eyes:  Negative for blurred vision and double vision.  Respiratory:  Negative for cough and shortness of breath.   Cardiovascular:  Negative for chest pain and palpitations.  Gastrointestinal:  Negative for abdominal pain, blood in stool, constipation, diarrhea, heartburn, melena and vomiting.  Genitourinary:  Negative for dysuria and urgency.  Musculoskeletal:  Negative for joint pain and myalgias.  Skin:  Negative for itching and rash.  Neurological:  Negative for dizziness and headaches.  Psychiatric/Behavioral:  Negative for depression. The patient is not nervous/anxious.      Objective: BP 111/72   Pulse 93   Temp 98.2 F (36.8 C)   Ht 5' 8 (1.727 m)   Wt 165 lb 3.2 oz (74.9 kg)   LMP 04/19/2022 (Within Weeks) Comment: urine preg.pending 05/19/22-irregular periods,some spotting with depo shot.  BMI 25.12 kg/m  Physical Exam Constitutional:      Appearance: Normal appearance.  HENT:     Head: Normocephalic and atraumatic.  Eyes:     Extraocular Movements: Extraocular movements intact.     Conjunctiva/sclera: Conjunctivae normal.  Cardiovascular:     Rate and Rhythm: Normal rate and regular rhythm.  Pulmonary:     Effort: Pulmonary effort is normal.     Breath sounds: Normal breath sounds.  Abdominal:     General: Bowel sounds are normal.     Palpations: Abdomen is soft.  Musculoskeletal:        General: No swelling. Normal range of motion.     Cervical back: Normal range of motion  and neck supple.  Skin:    General: Skin is warm and dry.     Coloration: Skin is not jaundiced.  Neurological:     General: No focal deficit present.     Mental Status: She is alert and oriented to person, place, and time.  Psychiatric:        Mood and Affect: Mood normal.        Behavior: Behavior normal.      Assessment: *Colovaginal fistula *Stage IV rectal adenocarcinoma to the liver  Plan: Will schedule for colonoscopy.The risks including infection, bleed, or perforation as well as benefits, limitations, alternatives and imponderables have been reviewed with the patient. Questions have been answered. All parties agreeable.  Further recommendations to follow. 01/10/2024 2:25 PM   Disclaimer: This note was dictated with voice recognition software. Similar sounding words can inadvertently be transcribed and may not be corrected upon review.

## 2024-01-22 ENCOUNTER — Encounter (HOSPITAL_COMMUNITY): Payer: Self-pay | Admitting: Internal Medicine

## 2024-01-22 ENCOUNTER — Ambulatory Visit (HOSPITAL_COMMUNITY): Payer: BC Managed Care – PPO | Admitting: Anesthesiology

## 2024-01-22 ENCOUNTER — Ambulatory Visit (HOSPITAL_COMMUNITY)
Admission: RE | Admit: 2024-01-22 | Discharge: 2024-01-22 | Disposition: A | Discharge status: Home / Self Care | Payer: BC Managed Care – PPO | Attending: Internal Medicine | Admitting: Internal Medicine

## 2024-01-22 ENCOUNTER — Other Ambulatory Visit: Payer: Self-pay

## 2024-01-22 ENCOUNTER — Encounter (HOSPITAL_COMMUNITY)
Admission: RE | Disposition: A | Discharge status: Home / Self Care | Payer: Self-pay | Source: Home / Self Care | Attending: Internal Medicine

## 2024-01-22 DIAGNOSIS — Z980 Intestinal bypass and anastomosis status: Secondary | ICD-10-CM

## 2024-01-22 DIAGNOSIS — Z1211 Encounter for screening for malignant neoplasm of colon: Secondary | ICD-10-CM | POA: Insufficient documentation

## 2024-01-22 DIAGNOSIS — Z85048 Personal history of other malignant neoplasm of rectum, rectosigmoid junction, and anus: Secondary | ICD-10-CM

## 2024-01-22 DIAGNOSIS — K632 Fistula of intestine: Secondary | ICD-10-CM | POA: Diagnosis not present

## 2024-01-22 DIAGNOSIS — K6289 Other specified diseases of anus and rectum: Secondary | ICD-10-CM | POA: Diagnosis not present

## 2024-01-22 DIAGNOSIS — I10 Essential (primary) hypertension: Secondary | ICD-10-CM | POA: Insufficient documentation

## 2024-01-22 DIAGNOSIS — Z90710 Acquired absence of both cervix and uterus: Secondary | ICD-10-CM | POA: Insufficient documentation

## 2024-01-22 DIAGNOSIS — N824 Other female intestinal-genital tract fistulae: Secondary | ICD-10-CM

## 2024-01-22 DIAGNOSIS — Z933 Colostomy status: Secondary | ICD-10-CM | POA: Diagnosis not present

## 2024-01-22 DIAGNOSIS — Z9221 Personal history of antineoplastic chemotherapy: Secondary | ICD-10-CM | POA: Insufficient documentation

## 2024-01-22 DIAGNOSIS — F17210 Nicotine dependence, cigarettes, uncomplicated: Secondary | ICD-10-CM | POA: Diagnosis not present

## 2024-01-22 DIAGNOSIS — Z923 Personal history of irradiation: Secondary | ICD-10-CM | POA: Diagnosis not present

## 2024-01-22 HISTORY — PX: BIOPSY: SHX5522

## 2024-01-22 HISTORY — PX: COLONOSCOPY WITH PROPOFOL: SHX5780

## 2024-01-22 SURGERY — COLONOSCOPY WITH PROPOFOL
Anesthesia: General

## 2024-01-22 MED ORDER — LACTATED RINGERS IV SOLN: INTRAVENOUS | Status: DC

## 2024-01-22 MED ORDER — LIDOCAINE HCL (PF) 2 % IJ SOLN
INTRAMUSCULAR | Status: DC | PRN
  Administered 2024-01-22: 50 mg via INTRADERMAL

## 2024-01-22 MED ORDER — PROPOFOL 10 MG/ML IV BOLUS
INTRAVENOUS | Status: DC | PRN
  Administered 2024-01-22: 40 mg via INTRAVENOUS
  Administered 2024-01-22: 100 mg via INTRAVENOUS
  Administered 2024-01-22: 50 mg via INTRAVENOUS

## 2024-01-22 MED ORDER — PROPOFOL 500 MG/50ML IV EMUL
INTRAVENOUS | Status: DC | PRN
  Administered 2024-01-22: 150 ug/kg/min via INTRAVENOUS

## 2024-01-22 MED ORDER — PHENYLEPHRINE 80 MCG/ML (10ML) SYRINGE FOR IV PUSH (FOR BLOOD PRESSURE SUPPORT)
PREFILLED_SYRINGE | INTRAVENOUS | Status: DC | PRN
  Administered 2024-01-22: 160 ug via INTRAVENOUS

## 2024-01-22 MED ORDER — PHENYLEPHRINE 80 MCG/ML (10ML) SYRINGE FOR IV PUSH (FOR BLOOD PRESSURE SUPPORT)
PREFILLED_SYRINGE | INTRAVENOUS | Status: AC
  Filled 2024-01-22: qty 10

## 2024-01-22 NOTE — Transfer of Care (Signed)
Immediate Anesthesia Transfer of Care Note  Patient: Terri Mckinney  Procedure(s) Performed: COLONOSCOPY WITH PROPOFOL BIOPSY  Patient Location: Endoscopy Unit  Anesthesia Type:General  Level of Consciousness: drowsy  Airway & Oxygen Therapy: Patient Spontanous Breathing  Post-op Assessment: Report given to RN and Post -op Vital signs reviewed and stable  Post vital signs: Reviewed and stable  Last Vitals:  Vitals Value Taken Time  BP 86/53 01/22/24 1405  Temp 36.4 C 01/22/24 1405  Pulse 65 01/22/24 1405  Resp 17 01/22/24 1405  SpO2 97 % 01/22/24 1405    Last Pain:  Vitals:   01/22/24 1405  TempSrc: Axillary  PainSc:          Complications: No notable events documented.

## 2024-01-22 NOTE — Discharge Instructions (Addendum)
  Colonoscopy Discharge Instructions  Read the instructions outlined below and refer to this sheet in the next few weeks. These discharge instructions provide you with general information on caring for yourself after you leave the hospital. Your doctor may also give you specific instructions. While your treatment has been planned according to the most current medical practices available, unavoidable complications occasionally occur.   ACTIVITY You may resume your regular activity, but move at a slower pace for the next 24 hours.  Take frequent rest periods for the next 24 hours.  Walking will help get rid of the air and reduce the bloated feeling in your belly (abdomen).  No driving for 24 hours (because of the medicine (anesthesia) used during the test).   Do not sign any important legal documents or operate any machinery for 24 hours (because of the anesthesia used during the test).  NUTRITION Drink plenty of fluids.  You may resume your normal diet as instructed by your doctor.  Begin with a light meal and progress to your normal diet. Heavy or fried foods are harder to digest and may make you feel sick to your stomach (nauseated).  Avoid alcoholic beverages for 24 hours or as instructed.  MEDICATIONS You may resume your normal medications unless your doctor tells you otherwise.  WHAT YOU CAN EXPECT TODAY Some feelings of bloating in the abdomen.  Passage of more gas than usual.  Spotting of blood in your stool or on the toilet paper.  IF YOU HAD POLYPS REMOVED DURING THE COLONOSCOPY: No aspirin products for 7 days or as instructed.  No alcohol for 7 days or as instructed.  Eat a soft diet for the next 24 hours.  FINDING OUT THE RESULTS OF YOUR TEST Not all test results are available during your visit. If your test results are not back during the visit, make an appointment with your caregiver to find out the results. Do not assume everything is normal if you have not heard from your  caregiver or the medical facility. It is important for you to follow up on all of your test results.  SEEK IMMEDIATE MEDICAL ATTENTION IF: You have more than a spotting of blood in your stool.  Your belly is swollen (abdominal distention).  You are nauseated or vomiting.  You have a temperature over 101.  You have abdominal pain or discomfort that is severe or gets worse throughout the day.   Your colonoscopy revealed what appeared to be a fistula tract in your rectum.  I did not see any evidence of cancer in this region.  I did take numerous biopsies.  We will call with these results within a few days.  Unfortunately the remaining portion your colon was not adequately prepped today.  I have forwarded my results to Dr. Maisie Fus and Dr. Tamela Oddi.  We have also called the radiology department and they should have your CT read hopefully by today or tomorrow.  I hope you have a great rest of your week!  Hennie Duos. Marletta Lor, D.O. Gastroenterology and Hepatology [facility]

## 2024-01-22 NOTE — Op Note (Signed)
[facility] Patient Name: Terri Mckinney Procedure Date: 01/22/2024 1:33 PM MRN: [id number] Date of Birth: [birth year] Attending MD: Hennie Duos. Marletta Lor , [place], [id number] CSN: [id number] Age: 42 Admit Type: Outpatient Procedure:                Colonoscopy Indications:              High risk colon cancer surveillance: Personal                            history of colon cancer, Colovaginal fistula Providers:                Hennie Duos. Marletta Lor, DO, Tammy Vaught, RN, Francoise Ceo RN, RN, Elinor Parkinson Referring MD:              Medicines:                See the Anesthesia note for documentation of the                            administered medications Complications:            No immediate complications. Estimated Blood Loss:     Estimated blood loss was minimal. Procedure:                Pre-Anesthesia Assessment:                           - The anesthesia plan was to use monitored                            anesthesia care (MAC).                           After obtaining informed consent, the colonoscope                            was passed under direct vision. Throughout the                            procedure, the patient's blood pressure, pulse, and                            oxygen saturations were monitored continuously. The                            [id number]-HQ190L ([id number]) scope was introduced through                            the anus and advanced to the the cecum, identified                            by appendiceal orifice and ileocecal valve. The                            colonoscopy  was performed without difficulty. The                            patient tolerated the procedure well. The quality                            of the bowel preparation was evaluated using the                            BBPS [facility] Bowel Preparation Scale) with scores                            of: Right Colon = 1 (portion of mucosa seen, but                             other areas not well seen due to staining, residual                            stool and/or opaque liquid), Transverse Colon = 1                            (portion of mucosa seen, but other areas not well                            seen due to staining, residual stool and/or opaque                            liquid) and Left Colon = 2 (minor amount of                            residual staining, small fragments of stool and/or                            opaque liquid, but mucosa seen well). The total                            BBPS score equals 4. The quality of the bowel                            preparation was inadequate. Scope In: 1:47:29 PM Scope Out: 2:01:12 PM Scope Withdrawal Time: 0 hours 9 minutes 19 seconds  Total Procedure Duration: 0 hours 13 minutes 43 seconds  Findings:      A small fistula was found in the rectum just proximal to the anal verge.       I did not see any evidence of cancer. Biopsies were taken with a cold       forceps for histology.      There was evidence of a prior low-anterior anastomosis in the rectum.       This was patent and was characterized by an intact staple line. The       anastomosis was traversed.      Extensive amounts of semi-solid stool was found in the entire colon,  precluding visualization. Impression:               - Preparation of the colon was inadequate.                           - Colonic fistula.                           - Patent end-to-end low-anterior anastomosis,                            characterized by an intact staple line.                           - Stool in the entire examined colon. Moderate Sedation:      Per Anesthesia Care Recommendation:           - Patient has a contact number available for                            emergencies. The signs and symptoms of potential                            delayed complications were discussed with the                            patient. Return to normal activities  tomorrow.                            Written discharge instructions were provided to the                            patient.                           - Resume previous diet.                           - Continue present medications.                           - Await pathology results.                           - I will forward my results to Dr. Maisie Fus and Dr.                            Jean Rosenthal?[place]                           - Consider repeat colonoscopy due to poor prep                            today as patient has not completed full colonsocopy                            prior. Procedure Code(s):        ---  Professional ---                           [phone], Colonoscopy, flexible; with biopsy, single                            or multiple Diagnosis Code(s):        --- Professional ---                           O96.295, Personal history of other malignant                            neoplasm of large intestine                           K63.2, Fistula of intestine CPT copyright 2022 [employer]. All rights reserved. The codes documented in this report are preliminary and upon coder review may  be revised to meet current compliance requirements. Hennie Duos. Marletta Lor, DO Hennie Duos. Marletta Lor, DO 01/22/2024 2:11:35 PM This report has been signed electronically. Number of Addenda: 0

## 2024-01-22 NOTE — Anesthesia Preprocedure Evaluation (Signed)
Anesthesia Evaluation  Patient identified by MRN, date of birth, ID band Patient awake    Reviewed: Allergy & Precautions, H&P , NPO status , Patient's Chart, lab work & pertinent test results, reviewed documented beta blocker date and time   Airway Mallampati: II  TM Distance: >3 FB Neck ROM: full    Dental no notable dental hx.    Pulmonary neg pulmonary ROS, Current Smoker and Patient abstained from smoking.   Pulmonary exam normal breath sounds clear to auscultation       Cardiovascular Exercise Tolerance: Good hypertension, negative cardio ROS  Rhythm:regular Rate:Normal     Neuro/Psych  Neuromuscular disease negative neurological ROS  negative psych ROS   GI/Hepatic negative GI ROS, Neg liver ROS,,,  Endo/Other  negative endocrine ROS    Renal/GU negative Renal ROS  negative genitourinary   Musculoskeletal   Abdominal   Peds  Hematology negative hematology ROS (+)   Anesthesia Other Findings   Reproductive/Obstetrics negative OB ROS                             Anesthesia Physical Anesthesia Plan  ASA: 2  Anesthesia Plan: General   Post-op Pain Management:    Induction:   PONV Risk Score and Plan: Propofol infusion  Airway Management Planned:   Additional Equipment:   Intra-op Plan:   Post-operative Plan:   Informed Consent: I have reviewed the patients History and Physical, chart, labs and discussed the procedure including the risks, benefits and alternatives for the proposed anesthesia with the patient or authorized representative who has indicated his/her understanding and acceptance.     Dental Advisory Given  Plan Discussed with: CRNA  Anesthesia Plan Comments:        Anesthesia Quick Evaluation

## 2024-01-22 NOTE — Interval H&P Note (Signed)
History and Physical Interval Note:  01/22/2024 1:37 PM  Terri Mckinney  has presented today for surgery, with the diagnosis of rectal cancer, colovaginal fistula.  The various methods of treatment have been discussed with the patient and family. After consideration of risks, benefits and other options for treatment, the patient has consented to  Procedure(s) with comments: COLONOSCOPY WITH PROPOFOL (N/A) - 215pm, asa 2 as a surgical intervention.  The patient's history has been reviewed, patient examined, no change in status, stable for surgery.  I have reviewed the patient's chart and labs.  Questions were answered to the patient's satisfaction.     Lanelle Bal

## 2024-01-22 NOTE — Anesthesia Procedure Notes (Signed)
Date/Time: 01/22/2024 1:42 PM  Performed by: Julian Reil, CRNAPre-anesthesia Checklist: Patient identified, Emergency Drugs available, Suction available and Patient being monitored Patient Re-evaluated:Patient Re-evaluated prior to induction Oxygen Delivery Method: Nasal cannula Induction Type: IV induction Placement Confirmation: positive ETCO2

## 2024-01-23 ENCOUNTER — Encounter (HOSPITAL_COMMUNITY): Payer: Self-pay | Admitting: Internal Medicine

## 2024-01-23 LAB — SURGICAL PATHOLOGY

## 2024-01-26 NOTE — Anesthesia Postprocedure Evaluation (Signed)
 Anesthesia Post Note  Patient: Terri Mckinney  Procedure(s) Performed: COLONOSCOPY WITH PROPOFOL BIOPSY  Patient location during evaluation: Phase II Anesthesia Type: General Level of consciousness: awake Pain management: pain level controlled Vital Signs Assessment: post-procedure vital signs reviewed and stable Respiratory status: spontaneous breathing and respiratory function stable Cardiovascular status: blood pressure returned to baseline and stable Postop Assessment: no headache and no apparent nausea or vomiting Anesthetic complications: no Comments: Late entry   No notable events documented.   Last Vitals:  Vitals:   01/22/24 1405 01/22/24 1411  BP: (!) 86/53 (!) 90/52  Pulse: 65 67  Resp: 17 16  Temp: (!) 36.4 C   SpO2: 97% 96%    Last Pain:  Vitals:   01/22/24 1411  TempSrc:   PainSc: 0-No pain                 Windell Norfolk

## 2024-01-28 NOTE — Progress Notes (Signed)
 [facility] [address], [place] [address]    Clinic Day:  01/29/2024  Referring physician: Junie Spencer, FNP  Patient Care Team: Terri Spencer, FNP as PCP - General (Family Medicine) Terri Massed, MD as Medical Oncologist (Medical Oncology) Terri Sarah, RN as Oncology Nurse Navigator (Medical Oncology)   ASSESSMENT & PLAN:   Assessment: 1. 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  Stools have become bloody/mucousy lately. - She also reported pain in the tailbone region since March 2023.  She also has right-sided lower back pain. - 50 pound weight loss in the last 9 months, part of weight loss was intentional.  She cut back on eating sweets and lost taste Mckinney sweets after COVID infection. - CT AP with contrast on 03/08/2022: Irregular circumferential masslike wall thickening of sigmoid colon/rectum with adjacent perirectal adenopathy.  Multiple small hypodense lesions in the liver, largest 2.9 cm in the central aspect of the liver, question metastatic disease. - Colonoscopy on 03/21/2022 by Dr. Marletta Mckinney: Fungating infiltrative nearly completely obstructing mass in the rectosigmoid colon, mass was circumferential measuring 4 cm in length. - Pathology: Rectal mass biopsy consistent with invasive moderately differentiated adenocarcinoma.  As there is very scant invasive tumor, MSI studies were deferred. - PET scan (03/23/2022): Hypermetabolic rectal primary long-segment with SUV 14.3.  Left posterior perirectal lymph node 7 mm with SUV 2.9.  Multiple tiny foci of hepatic hypermetabolism. - MRI of the liver (04/01/2022): Multiple small hypovascular rim-enhancing liver lesions, predominantly in the right hepatic lobe measuring up Mckinney 1.1 cm.  3.3 cm hypervascular mass in the central liver, most consistent with FNH/hepatic adenoma. - Liver biopsy (04/20/2022): Metastatic moderately  differentiated colonic adenocarcinoma with mucinous features - NGS testing shows K-ras G12 R mutation, PIK3CA exon 21 mutation.  MS-stable.  TMB-low. - Cycle 1 of FOLFOXIRI on 05/29/2022, bevacizumab added during cycle 2, cycle 13 on 11/15/2022 -MRI pelvis (10/23/2022): Known metastatic rectal cancer with persistent rectal narrowing and signs of involvement of posterior cervical stroma and anterior peritoneal reflection.  There is evidence of residual tumor in the posterior cervix.  Persistent presacral edema not substantially changed. - MRI liver (11/10/2022): 3 benign lesions and previous small rim-enhancing metastatic lesions in the liver are no longer seen. - Chemoradiation therapy with Xeloda from 12/11/2022 through 01/18/2023. - 04/04/2023: Robotic assisted LAR, diverting ileostomy, TAH with BSO - Pathology: YpT4b, ypN0, YPM1, 0/12 lymph nodes involved, no tumor deposits identified, margins negative, LVI negative, PNI positive, 1.5 cm grade 2 adenocarcinoma in the rectosigmoid colon, no perforation, MMR preserved. - 11/07/2023: Diverting loop ileostomy takedown   2. Social/family history: - She is separated and is seen today with her daughter.  She works as a Pensions consultant at [employer].  She is current active smoker, 1 pack/day for 27 years.  Denies drinking alcohol. - Paternal aunt had colon cancer.  Maternal cousin has breast cancer.  Maternal uncle had leukemia and maternal cousin had leukemia.    Plan: Metastatic rectal cancer Mckinney the liver: - CT CAP (12/30/2023): Left hepatic lobe lesion increased 5.2 x 5.1 cm, previously 3.5 x 3 cm.  Posterior right lower lobe lesion measures 1.8 x 1.7, previously 1.6 x 1.4.  Cavitary nodule in the right lung measures 13 x 10 mm, previously 1211 mm.  3 mm nodule in the left upper lobe is now 6 mm.  This was compared Mckinney CT from  10/01/2023. - She had 61-month break from chemotherapy due Mckinney surgery.  Reviewed labs today which shows normal LFTs and CBC.  She may proceed  with treatment today and in 2 weeks.  RTC 4 weeks for follow-up.  Will continue Mckinney hold Avastin.   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 - She had discussion with Dr. Maisie Fus.  Since her extensive surgery requires colostomy, patient would like Mckinney put it off at this time.  3.  Peripheral neuropathy: - Tingling in the ankles and toes is stable.  None in the hands.    Orders Placed This Encounter  Procedures   Magnesium    Standing Status:   Future    Expected Date:   02/12/2024    Expiration Date:   02/11/2025   CBC with Differential    Standing Status:   Future    Expected Date:   02/12/2024    Expiration Date:   02/11/2025   Comprehensive metabolic panel    Standing Status:   Future    Expected Date:   02/12/2024    Expiration Date:   02/11/2025   Urinalysis, dipstick only    Standing Status:   Future    Expected Date:   02/12/2024    Expiration Date:   02/11/2025   Magnesium    Standing Status:   Future    Expected Date:   02/26/2024    Expiration Date:   02/25/2025   CBC with Differential    Standing Status:   Future    Expected Date:   02/26/2024    Expiration Date:   02/25/2025   Comprehensive metabolic panel    Standing Status:   Future    Expected Date:   02/26/2024    Expiration Date:   02/25/2025   Urinalysis, dipstick only    Standing Status:   Future    Expected Date:   02/26/2024    Expiration Date:   02/25/2025   Magnesium    Standing Status:   Future    Expected Date:   03/11/2024    Expiration Date:   03/11/2025   CBC with Differential    Standing Status:   Future    Expected Date:   03/11/2024    Expiration Date:   03/11/2025   Comprehensive metabolic panel    Standing Status:   Future    Expected Date:   03/11/2024    Expiration Date:   03/11/2025   Urinalysis, dipstick only    Standing Status:   Future    Expected Date:   03/11/2024    Expiration Date:   03/11/2025   CEA    Standing Status:   Future     Expected Date:   02/12/2024    Expiration Date:   02/11/2025   CEA    Standing Status:   Future    Expected Date:   03/11/2024    Expiration Date:   03/11/2025      Mikeal Hawthorne R Teague,acting as a scribe for Terri Massed, MD.,have documented all relevant documentation on the behalf of Terri Massed, MD,as directed by  Terri Massed, MD while in the presence of Terri Massed, MD.  I, Terri Massed MD, have reviewed the above documentation for accuracy and completeness, and I agree with the above.     Terri Massed, MD   2/25/20254:57 PM  CHIEF COMPLAINT:   Diagnosis: metastatic rectal cancer Mckinney the liver    Cancer Staging  Rectal cancer [facility]) Staging form: Colon and Rectum, AJCC 8th Edition - Clinical stage from 03/28/2022: Stage IVA (cTX, cN1, cM1a) - Unsigned    Prior Therapy: 1. FOLFOXIRI and bevacizumab, 13 cycles, 05/29/22 - 11/15/22  2. Xeloda with radiation therapy, 12/11/22 - 01/18/23 3. LAR with diverting ileostomy and TAH w/ BSO, 04/04/23  Current Therapy:  FOLFIRI   HISTORY OF PRESENT ILLNESS:   Oncology History  Rectal cancer (HCC)  03/28/2022 Initial Diagnosis   Rectal cancer (HCC)   05/29/2022 - 07/26/2022 Chemotherapy   Patient is on Treatment Plan : COLORECTAL FOLFOXIRI + Bevacizumab q14d     05/29/2022 - 11/17/2022 Chemotherapy   Patient is on Treatment Plan : COLORECTAL FOLFOXIRI + Bevacizumab q14d      Genetic Testing   No pathogenic variants identified on the [facility] CustomNext+RNA panel. The report date is 09/08/2022.  The CustomNext-Cancer+RNAinsight panel offered by [facility] includes sequencing and rearrangement analysis for the following 47 genes:  APC, ATM, AXIN2, BARD1, BMPR1A, BRCA1, BRCA2, BRIP1, CDH1, CDK4, CDKN2A, CHEK2, DICER1, EPCAM, GREM1, HOXB13, MEN1, MLH1, MSH2, MSH3, MSH6, MUTYH, NBN, NF1, NF2, NTHL1, PALB2, PMS2, POLD1, POLE, PTEN, RAD51C, RAD51D, RECQL, RET, SDHA, SDHAF2, SDHB, SDHC, SDHD, SMAD4, SMARCA4,  STK11, TP53, TSC1, TSC2, and VHL.  RNA data is routinely analyzed for use in variant interpretation for all genes.   07/16/2023 -  Chemotherapy   Patient is on Treatment Plan : COLORECTAL FOLFIRI + Bevacizumab q14d        INTERVAL HISTORY:   Terri Mckinney is a 43 y.o. female presenting [facility]ney clinic today for follow up of metastatic rectal cancer Mckinney the liver. She was last seen by me on 01/08/24.  Since her last visit, she underwent a colonoscopy on 01/22/24 with Dr. Marletta Mckinney. Biopsy of the rectal fistula showed: colonic mucosa with active inflammation, granulation tissue and  reactive epithelial changes, negative for dysplasia or malignancy.   Today, she states that she is doing well overall. Her appetite level is at 100%. Her energy level is at 80%.  Onisha reports she was told by Dr. Maisie Fus she would reach out Mckinney someone at [facility] for a potential surgery or Araceli could have a colostomy bag for the rest of her life. She has not yet received an appointment for surgical consult, as she wishes Mckinney proceed with that option first. She denies any side effects from her last chemotherapy.   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   Port-A-Cath in place 05/24/2022   upper Rt chest   Tobacco abuse     Surgical History: Past Surgical History:  Procedure Laterality Date   BIOPSY  03/21/2022   Procedure: BIOPSY;  Surgeon: Lanelle Bal, DO;  [facility];  Service: Endoscopy;;   BIOPSY  01/22/2024   Procedure: BIOPSY;  Surgeon: Lanelle Bal, DO;  [facility];  Service: Endoscopy;;   COLONOSCOPY WITH PROPOFOL N/A 03/21/2022   Procedure: COLONOSCOPY WITH PROPOFOL;  Surgeon: Lanelle Bal, DO;  [facility];  Service: Endoscopy;  Laterality: N/A;  8:30am   COLONOSCOPY WITH PROPOFOL N/A 01/22/2024    Procedure: COLONOSCOPY WITH PROPOFOL;  Surgeon: Lanelle Bal, DO;  [facility];  Service: Endoscopy;  Laterality: N/A;  215pm, asa 2   FLEXIBLE SIGMOIDOSCOPY N/A 05/19/2022   Procedure:  FLEXIBLE SIGMOIDOSCOPY WITH TATTOO INJECTION;  Surgeon: Romie Levee, MD;  Location: [facility];  Service: General;  Laterality: N/A;   ILEOSTOMY N/A 04/04/2023   Procedure: DIVERTING ILEOSTOMY;  Surgeon: Romie Levee, MD;  Location: [facility];  Service: General;  Laterality: N/A;   ILEOSTOMY CLOSURE N/A 11/07/2023   Procedure: DIVERTING LOOP ILEOSTOMY TAKEDOWN;  Surgeon: Romie Levee, MD;  Location: [facility];  Service: General;  Laterality: N/A;   LAPAROSCOPIC DIVERTED COLOSTOMY N/A 05/19/2022   Procedure: LAPAROSCOPIC DIVERTED OSTOMY;  Surgeon: Romie Levee, MD;  [facility]: [facility];  Service: General;  Laterality: N/A;   PORTACATH PLACEMENT Right 05/19/2022   Procedure: INSERTION PORT-A-CATH;  Surgeon: Romie Levee, MD;  Location: [facility];  Service: General;  Laterality: Right;   ROBOTIC ASSISTED TOTAL HYSTERECTOMY WITH BILATERAL SALPINGO OOPHERECTOMY N/A 04/04/2023   Procedure: XI ROBOTIC ASSISTED TOTAL HYSTERECTOMY WITH BILATERAL SALPINGO OOPHORECTOMY;  Surgeon: Carver Fila, MD;  Location: [facility];  Service: Gynecology;  Laterality: N/A;   XI ROBOTIC ASSISTED LOWER ANTERIOR RESECTION N/A 04/04/2023   Procedure: XI ROBOTIC ASSISTED LOWER ANTERIOR RESECTION;  Surgeon: Romie Levee, MD;  Location: [facility];  Service: General;  Laterality: N/A;    Social History: Social History   Socioeconomic History   Marital status: Married    Spouse name: Not on file   Number of children: Not on file   Years of education: Not on file   Highest education level: Not on file  Occupational History   Not on file  Tobacco Use   Smoking status: Every Day    Current packs/day: 0.25    Average packs/day: 0.3 packs/day for 27.0 years (6.8 ttl pk-yrs)    Types: Cigarettes    Passive exposure: Current   Smokeless  tobacco: Never  Vaping Use   Vaping status: Never Used  Substance and Sexual Activity   Alcohol use: No   Drug use: No   Sexual activity: Yes    Birth control/protection: Injection  Other Topics Concern   Not on file  Social History Narrative   Not on file   Social Drivers of Health   Financial Resource Strain: Not on file  Food Insecurity: No Food Insecurity (11/08/2023)   Hunger Vital Sign    Worried About Running Out of Food in the Last Year: Never true    Ran Out of Food in the Last Year: Never true  Transportation Needs: No Transportation Needs (11/08/2023)   PRAPARE - Administrator, Civil Service (Medical): No    Lack of Transportation (Non-Medical): No  Physical Activity: Not on file  Stress: Not on file  Social Connections: Not on file  Intimate Partner Violence: Not At Risk (11/08/2023)   Humiliation, Afraid, Rape, and Kick questionnaire    Fear of Current or Ex-Partner: No    Emotionally Abused: No    Physically Abused: No    Sexually Abused: No    Family History: Family History  Problem Relation Age of Onset   Heart disease Mother    Diabetes Mother    Hearing loss Father    Heart disease Father    Hyperlipidemia Father    Hypertension Father    Stroke Father    Arthritis Father    Diabetes Father    Learning disabilities Brother    Diabetes Maternal Grandmother    Heart disease Maternal Grandmother    Diabetes Maternal Grandfather    Diabetes Paternal Grandmother    Diabetes Paternal Grandfather    Leukemia Maternal Uncle  d. 88s   Colon cancer Paternal Aunt        dx 4s   Breast cancer Cousin 33   Lung cancer Maternal Aunt     Current Medications:  Current Outpatient Medications:    estradiol (ESTRACE VAGINAL) 0.1 MG/GM vaginal cream, 1 week after surgery, begin Mckinney use cream nightly at bedtime for 2 weeks then 3 times a week after Mckinney assist with healing vagina. Use a fingertip size amount of cream on finger and place  slightly past vaginal entrance. Do not use the applicator that comes with cream., Disp: 42.5 g, Rfl: 3   loperamide (IMODIUM) 2 MG capsule, Take 1 capsule (2 mg total) by mouth 4 (four) times daily as needed for diarrhea or loose stools., Disp: 12 capsule, Rfl: 0   Multiple Vitamin (MULTIVITAMIN) tablet, Take 1 tablet by mouth daily., Disp: , Rfl:  No current facility-administered medications for this visit.  Facility-Administered Medications Ordered in Other Visits:    fluorouracil (ADRUCIL) 4,500 mg in sodium chloride 0.9 % 60 mL chemo infusion, 2,400 mg/m2 (Treatment Plan Recorded), Intravenous, 1 day or 1 dose, Terri Massed, MD, Infusion Verify at 01/29/24 1237   heparin lock flush 100 unit/mL, 500 Units, Intracatheter, Once PRN, Terri Massed, MD   sodium chloride flush (NS) 0.9 % injection 10 mL, 10 mL, Intracatheter, PRN, Terri Massed, MD   Allergies: Allergies  Allergen Reactions   Wellbutrin [Bupropion]     insomnia   Tramadol Rash    Mainly in chest and neck area    REVIEW OF SYSTEMS:   Review of Systems  Constitutional:  Negative for chills, fatigue and fever.  HENT:   Negative for lump/mass, mouth sores, nosebleeds, sore throat and trouble swallowing.   Eyes:  Negative for eye problems.  Respiratory:  Negative for cough and shortness of breath.   Cardiovascular:  Negative for chest pain, leg swelling and palpitations.  Gastrointestinal:  Positive for constipation and rectal pain (4-5/10 severity). Negative for abdominal pain, diarrhea, nausea and vomiting.       +rectal bleeding  Genitourinary:  Negative for bladder incontinence, difficulty urinating, dysuria, frequency, hematuria and nocturia.   Musculoskeletal:  Negative for arthralgias, back pain, flank pain, myalgias and neck pain.  Skin:  Negative for itching and rash.  Neurological:  Positive for numbness (and tingling in feet). Negative for dizziness and headaches.  Hematological:  Does not  bruise/bleed easily.  Psychiatric/Behavioral:  Negative for depression, sleep disturbance and suicidal ideas. The patient is not nervous/anxious.   All other systems reviewed and are negative.    VITALS:   Last menstrual period 04/19/2022.  Wt Readings from Last 3 Encounters:  01/29/24 162 lb 14.4 oz (73.9 kg)  01/10/24 165 lb 3.2 oz (74.9 kg)  01/08/24 166 lb 0.1 oz (75.3 kg)    There is no height or weight on file Mckinney calculate BMI.  Performance status (ECOG): 0 - Asymptomatic  PHYSICAL EXAM:   Physical Exam Vitals and nursing note reviewed. Exam conducted with a chaperone present.  Constitutional:      Appearance: Normal appearance.  Cardiovascular:     Rate and Rhythm: Normal rate and regular rhythm.     Pulses: Normal pulses.     Heart sounds: Normal heart sounds.  Pulmonary:     Effort: Pulmonary effort is normal.     Breath sounds: Normal breath sounds.  Abdominal:     Palpations: Abdomen is soft. There is no hepatomegaly, splenomegaly or mass.  Tenderness: There is no abdominal tenderness.  Musculoskeletal:     Right lower leg: No edema.     Left lower leg: No edema.  Lymphadenopathy:     Cervical: No cervical adenopathy.     Right cervical: No superficial, deep or posterior cervical adenopathy.    Left cervical: No superficial, deep or posterior cervical adenopathy.     Upper Body:     Right upper body: No supraclavicular or axillary adenopathy.     Left upper body: No supraclavicular or axillary adenopathy.  Neurological:     General: No focal deficit present.     Mental Status: She is alert and oriented Mckinney person, place, and time.  Psychiatric:        Mood and Affect: Mood normal.        Behavior: Behavior normal.     LABS:   CBC     Component Value Date/Time   WBC 5.6 01/29/2024 0835   RBC 4.14 01/29/2024 0835   HGB 13.0 01/29/2024 0835   HGB 12.3 03/03/2022 1618   HCT 39.5 01/29/2024 0835   HCT 35.4 03/03/2022 1618   PLT 275 01/29/2024  0835   PLT 351 03/03/2022 1618   MCV 95.4 01/29/2024 0835   MCV 91 03/03/2022 1618   MCH 31.4 01/29/2024 0835   MCHC 32.9 01/29/2024 0835   RDW 14.5 01/29/2024 0835   RDW 12.1 03/03/2022 1618   LYMPHSABS 1.1 01/29/2024 0835   LYMPHSABS 3.7 (H) 03/03/2022 1618   MONOABS 0.4 01/29/2024 0835   EOSABS 0.5 01/29/2024 0835   EOSABS 0.9 (H) 03/03/2022 1618   BASOSABS 0.1 01/29/2024 0835   BASOSABS 0.1 03/03/2022 1618    CMP      Component Value Date/Time   NA 138 01/29/2024 0835   NA 144 03/03/2022 1618   K 3.7 01/29/2024 0835   CL 102 01/29/2024 0835   CO2 24 01/29/2024 0835   GLUCOSE 149 (H) 01/29/2024 0835   BUN 12 01/29/2024 0835   BUN 10 03/03/2022 1618   CREATININE 0.68 01/29/2024 0835   CALCIUM 9.3 01/29/2024 0835   PROT 6.7 01/29/2024 0835   PROT 5.8 (L) 03/03/2022 1618   ALBUMIN 3.6 01/29/2024 0835   ALBUMIN 4.0 03/03/2022 1618   AST 19 01/29/2024 0835   ALT 15 01/29/2024 0835   ALKPHOS 68 01/29/2024 0835   BILITOT 0.4 01/29/2024 0835   BILITOT <0.2 03/03/2022 1618   GFRNONAA >60 01/29/2024 0835   GFRAA 97 07/16/2020 1604     Lab Results  Component Value Date   CEA1 24.7 (H) 12/11/2023   /  CEA  Date Value Ref Range Status  12/11/2023 24.7 (H) 0.0 - 4.7 ng/mL Final    Comment:    (NOTE)                             Nonsmokers          <3.9                             Smokers             <5.6 [employer] Electrochemiluminescence Immunoassay (ECLIA) Values obtained with different assay methods or kits cannot be used interchangeably.  Results cannot be interpreted as absolute evidence of the presence or absence of malignant disease. Performed At: [facility] [address] [place], [place] [id number] Jolene Schimke MD XB:[id number]  No results found for: "PSA1" No results found for: "WUJ811" No results found for: "CAN125"  No results found for: "TOTALPROTELP", "ALBUMINELP", "A1GS", "A2GS", "BETS", "BETA2SER", "GAMS", "MSPIKE",  "SPEI" No results found for: "TIBC", "FERRITIN", "IRONPCTSAT" No results found for: "LDH"   STUDIES:   CT PELVIS W CONTRAST Result Date: 01/22/2024 CLINICAL DATA:  History of metastatic rectal cancer with clinical concern for colovesical fistula. EXAM: CT PELVIS WITH CONTRAST TECHNIQUE: Multidetector CT imaging of the pelvis was performed using the standard protocol following the bolus administration of intravenous contrast. RADIATION DOSE REDUCTION: This exam was performed according Mckinney the departmental dose-optimization program which includes automated exposure control, adjustment of the mA and/or kV according Mckinney patient size and/or use of iterative reconstruction technique. CONTRAST:  OMNIPAQUE IOHEXOL 300 MG/ML SOLN, 30mL OMNIPAQUE IOHEXOL 300 MG/ML SOLN COMPARISON:  CT scan 12/30/2023 FINDINGS: Urinary Tract: The bladder is unremarkable. No contrast is identified Mckinney suggest a colovesical fistula. There is no gas in the bladder and no bladder mass or calculi. Bowel: Surgical changes from prior APR and colo-anal anastomosis. There is a persistent gas containing fluid collection in the presacral space. There may be a tiny amount high attenuation material adjacent Mckinney the gas on the left side on image 29/2. No obvious communication with the colon. However, the inflated balloon on the rectal catheter could be obscuring a fistula. Small amount of gas noted in the vagina but no obvious contrast. Again, however, the inflated balloon could potentially obscure a fistula. The opacified sigmoid colon is unremarkable. The visualized small bowel loops appear normal. Vascular/Lymphatic: No pathologically enlarged lymph nodes. No significant vascular abnormality seen. Reproductive:  Surgically absent Other: Stable presacral fluid collection. This contains gas but no obvious rim like enhancement Mckinney suggest an abscess. Musculoskeletal: No significant bony findings. IMPRESSION: 1. No contrast is identified in the  bladder Mckinney suggest a colovesical fistula. 2. Stable gas containing fluid collection in the presacral space. No obvious communication with the colon. However, the inflated balloon on the rectal catheter could be obscuring a fistula. 3. It may be helpful Mckinney repeat this study without the rectal catheter balloon inflated Mckinney make sure it is not obscuring a fistulous communication with the presacral fluid collection or the vagina. 4. No findings for metastatic disease. Electronically Signed   By: Rudie Meyer M.D.   On: 01/22/2024 14:07

## 2024-01-29 ENCOUNTER — Inpatient Hospital Stay: Payer: BC Managed Care – PPO

## 2024-01-29 ENCOUNTER — Inpatient Hospital Stay: Payer: BC Managed Care – PPO | Admitting: Hematology

## 2024-01-29 VITALS — BP 126/72 | HR 90 | Temp 97.6°F | Resp 18

## 2024-01-29 DIAGNOSIS — C20 Malignant neoplasm of rectum: Secondary | ICD-10-CM

## 2024-01-29 DIAGNOSIS — Z5111 Encounter for antineoplastic chemotherapy: Secondary | ICD-10-CM | POA: Diagnosis not present

## 2024-01-29 DIAGNOSIS — Z95828 Presence of other vascular implants and grafts: Secondary | ICD-10-CM

## 2024-01-29 LAB — COMPREHENSIVE METABOLIC PANEL WITH GFR
ALT: 15 U/L (ref 0–44)
AST: 19 U/L (ref 15–41)
Albumin: 3.6 g/dL (ref 3.5–5.0)
Alkaline Phosphatase: 68 U/L (ref 38–126)
Anion gap: 12 (ref 5–15)
BUN: 12 mg/dL (ref 6–20)
CO2: 24 mmol/L (ref 22–32)
Calcium: 9.3 mg/dL (ref 8.9–10.3)
Chloride: 102 mmol/L (ref 98–111)
Creatinine, Ser: 0.68 mg/dL (ref 0.44–1.00)
GFR, Estimated: >60 mL/min (ref >60)
Glucose, Bld: 149 mg/dL — ABNORMAL HIGH (ref 70–99)
Potassium: 3.7 mmol/L (ref 3.5–5.1)
Sodium: 138 mmol/L (ref 135–145)
Total Bilirubin: 0.4 mg/dL (ref 0.0–1.2)
Total Protein: 6.7 g/dL (ref 6.5–8.1)

## 2024-01-29 LAB — CBC WITH DIFFERENTIAL/PLATELET
Abs Immature Granulocytes: 0.02 10*3/uL (ref 0.00–0.07)
Basophils Absolute: 0.1 10*3/uL (ref 0.0–0.1)
Basophils Relative: 1 %
Eosinophils Absolute: 0.5 10*3/uL (ref 0.0–0.5)
Eosinophils Relative: 8 %
HCT: 39.5 % (ref 36.0–46.0)
Hemoglobin: 13 g/dL (ref 12.0–15.0)
Immature Granulocytes: 0 %
Lymphocytes Relative: 19 %
Lymphs Abs: 1.1 10*3/uL (ref 0.7–4.0)
MCH: 31.4 pg (ref 26.0–34.0)
MCHC: 32.9 g/dL (ref 30.0–36.0)
MCV: 95.4 fL (ref 80.0–100.0)
Monocytes Absolute: 0.4 10*3/uL (ref 0.1–1.0)
Monocytes Relative: 7 %
Neutro Abs: 3.6 10*3/uL (ref 1.7–7.7)
Neutrophils Relative %: 65 %
Platelets: 275 10*3/uL (ref 150–400)
RBC: 4.14 MIL/uL (ref 3.87–5.11)
RDW: 14.5 % (ref 11.5–15.5)
WBC: 5.6 10*3/uL (ref 4.0–10.5)
nRBC: 0 % (ref 0.0–0.2)

## 2024-01-29 LAB — MAGNESIUM: Magnesium: 2 mg/dL (ref 1.7–2.4)

## 2024-01-29 MED ORDER — SODIUM CHLORIDE 0.9 % IV SOLN
180.0000 mg/m2 | Freq: Once | INTRAVENOUS | Status: AC
  Administered 2024-01-29: 340 mg via INTRAVENOUS
  Filled 2024-01-29: qty 17

## 2024-01-29 MED ORDER — SODIUM CHLORIDE 0.9 % IV SOLN
2400.0000 mg/m2 | INTRAVENOUS | Status: DC
  Administered 2024-01-29: 4500 mg via INTRAVENOUS
  Filled 2024-01-29: qty 90

## 2024-01-29 MED ORDER — PALONOSETRON HCL INJECTION 0.25 MG/5ML
0.2500 mg | Freq: Once | INTRAVENOUS | Status: AC
  Administered 2024-01-29: 0.25 mg via INTRAVENOUS
  Filled 2024-01-29: qty 5

## 2024-01-29 MED ORDER — SODIUM CHLORIDE 0.9% FLUSH: 10.0000 mL | INTRAVENOUS | Status: DC | PRN

## 2024-01-29 MED ORDER — SODIUM CHLORIDE 0.9% FLUSH
10.0000 mL | INTRAVENOUS | Status: DC | PRN
  Administered 2024-01-29: 10 mL via INTRAVENOUS

## 2024-01-29 MED ORDER — DEXAMETHASONE SODIUM PHOSPHATE 10 MG/ML IJ SOLN
10.0000 mg | Freq: Once | INTRAMUSCULAR | Status: AC
Start: 2024-01-29 — End: 2024-01-29
  Administered 2024-01-29: 10 mg via INTRAVENOUS
  Filled 2024-01-29: qty 1

## 2024-01-29 MED ORDER — HEPARIN SOD (PORK) LOCK FLUSH 100 UNIT/ML IV SOLN
500.0000 [IU] | Freq: Once | INTRAVENOUS | Status: DC | PRN
Start: 2024-01-29 — End: 2024-01-29

## 2024-01-29 MED ORDER — FLUOROURACIL CHEMO INJECTION 2.5 GM/50ML
400.0000 mg/m2 | Freq: Once | INTRAVENOUS | Status: AC
  Administered 2024-01-29: 750 mg via INTRAVENOUS
  Filled 2024-01-29: qty 15

## 2024-01-29 MED ORDER — SODIUM CHLORIDE 0.9 % IV SOLN
400.0000 mg/m2 | Freq: Once | INTRAVENOUS | Status: AC
  Administered 2024-01-29: 748 mg via INTRAVENOUS
  Filled 2024-01-29: qty 37.4

## 2024-01-29 MED ORDER — SODIUM CHLORIDE 0.9 % IV SOLN: Freq: Once | INTRAVENOUS | Status: AC

## 2024-01-29 NOTE — Patient Instructions (Signed)
 [facility] [place] - A DEPT OF [facility]  Discharge Instructions: Thank you for choosing [facility] to provide your oncology and hematology care.  If you have a lab appointment with the Cancer Center - please note that after April 8th, 2024, all labs will be drawn in the cancer center.  You do not have to check in or register with the main entrance as you have in the past but will complete your check-in in the cancer center.  Wear comfortable clothing and clothing appropriate for easy access to any Portacath or PICC line.   We strive to give you quality time with your provider. You may need to reschedule your appointment if you arrive late (15 or more minutes).  Arriving late affects you and other patients whose appointments are after yours.  Also, if you miss three or more appointments without notifying the office, you may be dismissed from the clinic at the provider's discretion.      For prescription refill requests, have your pharmacy contact our office and allow 72 hours for refills to be completed.    Today you received the following chemotherapy and/or immunotherapy agents Fofiri 5FU      To help prevent nausea and vomiting after your treatment, we encourage you to take your nausea medication as directed.  BELOW ARE SYMPTOMS THAT SHOULD BE REPORTED IMMEDIATELY: *FEVER GREATER THAN 100.4 F (38 C) OR HIGHER *CHILLS OR SWEATING *NAUSEA AND VOMITING THAT IS NOT CONTROLLED WITH YOUR NAUSEA MEDICATION *UNUSUAL SHORTNESS OF BREATH *UNUSUAL BRUISING OR BLEEDING *URINARY PROBLEMS (pain or burning when urinating, or frequent urination) *BOWEL PROBLEMS (unusual diarrhea, constipation, pain near the anus) TENDERNESS IN MOUTH AND THROAT WITH OR WITHOUT PRESENCE OF ULCERS (sore throat, sores in mouth, or a toothache) UNUSUAL RASH, SWELLING OR PAIN  UNUSUAL VAGINAL DISCHARGE OR ITCHING   Items with * indicate a potential emergency and should be followed  up as soon as possible or go to the Emergency Department if any problems should occur.  Please show the CHEMOTHERAPY ALERT CARD or IMMUNOTHERAPY ALERT CARD at check-in to the Emergency Department and triage nurse.  Should you have questions after your visit or need to cancel or reschedule your appointment, please contact [facility] [place] - A DEPT OF Eligha Bridegroom [facility] [phone]  and follow the prompts.  Office hours are 8:00 a.m. to 4:30 p.m. Monday - Friday. Please note that voicemails left after 4:00 p.m. may not be returned until the following business day.  We are closed weekends and major holidays. You have access to a nurse at all times for urgent questions. Please call the main number to the clinic [phone] and follow the prompts.  For any non-urgent questions, you may also contact your provider using MyChart. We now offer e-Visits for anyone 44 and older to request care online for non-urgent symptoms. For details visit [email or web address].   Also download the MyChart app! Go to the app store, search "MyChart", open the app, select [place], and log in with your MyChart username and password.

## 2024-01-29 NOTE — Progress Notes (Signed)
 Patient has been examined by Dr. Ellin Saba. Vital signs (HR 101) and labs have been reviewed by MD - ANC, Creatinine, LFTs, hemoglobin, and platelets are within treatment parameters per M.D. - pt may proceed with treatment.  Primary RN and pharmacy notified.

## 2024-01-29 NOTE — Progress Notes (Signed)
 Patient presents today for [facility] with 5FU pump start per providers order.  Vital signs and labs reviewed by MD.  Messages received from Chapman Moss RN/Dr. Ellin Saba patient okay for treatment.  Treatment given today per MD orders.  Tolerated infusion without adverse affects.  5FU pump connected and verified RUN on the screen with the patient.  Vital signs stable.  No complaints at this time.  Discharge from clinic ambulatory in stable condition.  Alert and oriented X 3.  Follow up with [facility] as scheduled.

## 2024-01-29 NOTE — Patient Instructions (Signed)

## 2024-01-30 ENCOUNTER — Other Ambulatory Visit: Payer: Self-pay

## 2024-01-31 ENCOUNTER — Inpatient Hospital Stay: Payer: BC Managed Care – PPO

## 2024-01-31 VITALS — BP 116/68 | HR 98 | Temp 97.6°F | Resp 20

## 2024-01-31 DIAGNOSIS — Z5111 Encounter for antineoplastic chemotherapy: Secondary | ICD-10-CM | POA: Diagnosis not present

## 2024-01-31 DIAGNOSIS — C20 Malignant neoplasm of rectum: Secondary | ICD-10-CM

## 2024-01-31 DIAGNOSIS — Z95828 Presence of other vascular implants and grafts: Secondary | ICD-10-CM

## 2024-01-31 MED ORDER — SODIUM CHLORIDE 0.9% FLUSH
10.0000 mL | INTRAVENOUS | Status: DC | PRN
  Administered 2024-01-31: 10 mL

## 2024-01-31 MED ORDER — HEPARIN SOD (PORK) LOCK FLUSH 100 UNIT/ML IV SOLN
500.0000 [IU] | Freq: Once | INTRAVENOUS | Status: AC | PRN
  Administered 2024-01-31: 500 [IU]

## 2024-01-31 NOTE — Patient Instructions (Signed)

## 2024-01-31 NOTE — Progress Notes (Signed)
Patients port flushed without difficulty.  Good blood return noted with no bruising or swelling noted at site.  Band aid applied.  Home infusion 5FU pump disconnected.  VSS with discharge and left in satisfactory condition with no s/s of distress noted.   

## 2024-02-06 ENCOUNTER — Inpatient Hospital Stay: Payer: BC Managed Care – PPO | Attending: Hematology | Admitting: Obstetrics & Gynecology

## 2024-02-06 VITALS — BP 120/60 | HR 87 | Temp 98.2°F | Resp 20 | Wt 161.8 lb

## 2024-02-06 DIAGNOSIS — L309 Dermatitis, unspecified: Secondary | ICD-10-CM | POA: Insufficient documentation

## 2024-02-06 DIAGNOSIS — R918 Other nonspecific abnormal finding of lung field: Secondary | ICD-10-CM | POA: Diagnosis not present

## 2024-02-06 DIAGNOSIS — C20 Malignant neoplasm of rectum: Secondary | ICD-10-CM | POA: Insufficient documentation

## 2024-02-06 DIAGNOSIS — Z5111 Encounter for antineoplastic chemotherapy: Secondary | ICD-10-CM | POA: Diagnosis present

## 2024-02-06 DIAGNOSIS — Z800 Family history of malignant neoplasm of digestive organs: Secondary | ICD-10-CM | POA: Insufficient documentation

## 2024-02-06 DIAGNOSIS — Z9079 Acquired absence of other genital organ(s): Secondary | ICD-10-CM | POA: Diagnosis not present

## 2024-02-06 DIAGNOSIS — K6289 Other specified diseases of anus and rectum: Secondary | ICD-10-CM | POA: Insufficient documentation

## 2024-02-06 DIAGNOSIS — Z801 Family history of malignant neoplasm of trachea, bronchus and lung: Secondary | ICD-10-CM | POA: Insufficient documentation

## 2024-02-06 DIAGNOSIS — Z803 Family history of malignant neoplasm of breast: Secondary | ICD-10-CM | POA: Insufficient documentation

## 2024-02-06 DIAGNOSIS — Z806 Family history of leukemia: Secondary | ICD-10-CM | POA: Insufficient documentation

## 2024-02-06 DIAGNOSIS — F17210 Nicotine dependence, cigarettes, uncomplicated: Secondary | ICD-10-CM | POA: Diagnosis not present

## 2024-02-06 DIAGNOSIS — N823 Fistula of vagina to large intestine: Secondary | ICD-10-CM | POA: Insufficient documentation

## 2024-02-06 DIAGNOSIS — Z90722 Acquired absence of ovaries, bilateral: Secondary | ICD-10-CM | POA: Insufficient documentation

## 2024-02-06 DIAGNOSIS — Z90710 Acquired absence of both cervix and uterus: Secondary | ICD-10-CM | POA: Diagnosis not present

## 2024-02-06 DIAGNOSIS — R159 Full incontinence of feces: Secondary | ICD-10-CM | POA: Diagnosis not present

## 2024-02-06 DIAGNOSIS — C787 Secondary malignant neoplasm of liver and intrahepatic bile duct: Secondary | ICD-10-CM | POA: Insufficient documentation

## 2024-02-06 NOTE — Patient Instructions (Signed)
 Continue using barrier methods on the skin on the vulva.   We will place a referral for you to meet with a pain specialist to see other options to help with the discomfort.   Plan on following up with Dr. Maisie Fus as scheduled.

## 2024-02-06 NOTE — Progress Notes (Unsigned)
   Follow Up Note: Gyn-Onc  Terri Mckinney 43 y.o. female  CC: Stool per vagina, vulva irritation   HPI: Terri Mckinney is a 43 y.o. woman with rectal adenocarcinoma status post neoadjuvant chemotherapy followed by chemo RT now status post debulking surgery.  Total hysterectomy with BSO performed at the same time given posterior cervical involvement by her adenocarcinoma.  Interval History: A CT pelvis in 2/25 failed to show an obvious fistula.  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 Biopsies were taken at the time of the endoscopy.  The histology was benign..She is experiencing pain that  is exacerbated by urination and bowel movements, causing significant discomfort. Per Dr. Maisie Fus further surgery would require a colostomy or ileostomy.   Pt is considering further invasive procedures at this time. A referral has been placed to a colorectal surgeon at [facility].    Review of Systems  Review of Systems  Constitutional:  Negative for malaise/fatigue and weight loss.  Respiratory:  Negative for shortness of breath and wheezing.   Cardiovascular:  Negative for chest pain and leg swelling.  Gastrointestinal:  Negative for abdominal pain, blood in stool, constipation, nausea and vomiting.  Genitourinary:  See above  Musculoskeletal:  Negative for joint pain and myalgias.  Neurological:  Negative for weakness.  Psychiatric/Behavioral:  Negative for depression. The patient does not have insomnia.    Current medications, allergy, social history, past surgical history, past medical history, family history were all reviewed.    Vitals: BP 120/60 (BP Location: Left Arm, Patient Position: Sitting)   Pulse 87   Temp 98.2 F (36.8 C) (Oral)   Resp 20   Wt 161 lb 12.8 oz (73.4 kg)   LMP 04/19/2022 (Within Weeks) Comment: urine preg.pending 05/19/22-irregular periods,some spotting with depo shot.  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 Consider a component of a GI pain generator -gentle cleansing with a pH-balanced wash, applying barrier creams containing zinc oxide, wearing absorbent pads, and avoiding irritants like harsh soaps and douches -explore pain-management options  I personally spent 25 minutes face-to-face and non-face-to-face in the care of this patient, which includes all pre, intra, and post visit time on the date of service.    Antionette Char, MD

## 2024-02-06 NOTE — Progress Notes (Incomplete)
   Follow Up Note: Gyn-Onc  Terri Mckinney 43 y.o. female  CC: Stool per vagina, vulva irritation   HPI: Terri Mckinney is a 43 y.o. woman with rectal adenocarcinoma status post neoadjuvant chemotherapy followed by chemo RT now status post debulking surgery.  Total hysterectomy with BSO performed at the same time given posterior cervical involvement by her adenocarcinoma.  Interval History: A CT pelvis in 2/25 failed to show an obvious fistula.  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 Biopsies were taken at the time of the endoscopy.  The histology was benign. Per Dr. Maisie Fus further surgery would require a colostomy or ileostomy.   Pt elects is considering further invasive procedures at this time. A referral has been placed to a colorectal surgeon at [facility].    Review of Systems  Review of Systems  Constitutional:  Negative for malaise/fatigue and weight loss.  Respiratory:  Negative for shortness of breath and wheezing.   Cardiovascular:  Negative for chest pain and leg swelling.  Gastrointestinal:  Negative for abdominal pain, blood in stool, constipation, nausea and vomiting.  Genitourinary:  See above  Musculoskeletal:  Negative for joint pain and myalgias.  Neurological:  Negative for weakness.  Psychiatric/Behavioral:  Negative for depression. The patient does not have insomnia.    Current medications, allergy, social history, past surgical history, past medical history, family history were all reviewed.    Vitals: BP 120/60 (BP Location: Left Arm, Patient Position: Sitting)   Pulse 87   Temp 98.2 F (36.8 C) (Oral)   Resp 20   Wt 161 lb 12.8 oz (73.4 kg)   LMP 04/19/2022 (Within Weeks) Comment: urine preg.pending 05/19/22-irregular periods,some spotting with depo shot.  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    Antionette Char, MD

## 2024-02-07 ENCOUNTER — Encounter (HOSPITAL_COMMUNITY): Payer: Self-pay | Admitting: Hematology

## 2024-02-07 ENCOUNTER — Telehealth: Payer: Self-pay | Admitting: *Deleted

## 2024-02-07 ENCOUNTER — Encounter: Payer: Self-pay | Admitting: Obstetrics & Gynecology

## 2024-02-07 NOTE — Telephone Encounter (Signed)
 Per Dr Tamela Oddi fax records to [facility]

## 2024-02-11 ENCOUNTER — Inpatient Hospital Stay: Payer: BC Managed Care – PPO

## 2024-02-11 ENCOUNTER — Encounter (HOSPITAL_COMMUNITY): Payer: Self-pay | Admitting: Hematology

## 2024-02-11 ENCOUNTER — Telehealth: Payer: Self-pay | Admitting: *Deleted

## 2024-02-11 DIAGNOSIS — Z5111 Encounter for antineoplastic chemotherapy: Secondary | ICD-10-CM | POA: Diagnosis not present

## 2024-02-11 DIAGNOSIS — C20 Malignant neoplasm of rectum: Secondary | ICD-10-CM

## 2024-02-11 DIAGNOSIS — Z95828 Presence of other vascular implants and grafts: Secondary | ICD-10-CM

## 2024-02-11 LAB — CBC WITH DIFFERENTIAL/PLATELET
Abs Immature Granulocytes: 0.01 10*3/uL (ref 0.00–0.07)
Basophils Absolute: 0 10*3/uL (ref 0.0–0.1)
Basophils Relative: 1 %
Eosinophils Absolute: 0.4 10*3/uL (ref 0.0–0.5)
Eosinophils Relative: 7 %
HCT: 34.5 % — ABNORMAL LOW (ref 36.0–46.0)
Hemoglobin: 11.3 g/dL — ABNORMAL LOW (ref 12.0–15.0)
Immature Granulocytes: 0 %
Lymphocytes Relative: 15 %
Lymphs Abs: 0.9 10*3/uL (ref 0.7–4.0)
MCH: 31.7 pg (ref 26.0–34.0)
MCHC: 32.8 g/dL (ref 30.0–36.0)
MCV: 96.6 fL (ref 80.0–100.0)
Monocytes Absolute: 0.3 10*3/uL (ref 0.1–1.0)
Monocytes Relative: 5 %
Neutro Abs: 4.1 10*3/uL (ref 1.7–7.7)
Neutrophils Relative %: 72 %
Platelets: 230 10*3/uL (ref 150–400)
RBC: 3.57 MIL/uL — ABNORMAL LOW (ref 3.87–5.11)
RDW: 15 % (ref 11.5–15.5)
WBC: 5.8 10*3/uL (ref 4.0–10.5)
nRBC: 0 % (ref 0.0–0.2)

## 2024-02-11 LAB — URINALYSIS, DIPSTICK ONLY
Bilirubin Urine: NEGATIVE
Glucose, UA: NEGATIVE mg/dL
Hgb urine dipstick: NEGATIVE
Ketones, ur: NEGATIVE mg/dL
Leukocytes,Ua: NEGATIVE
Nitrite: NEGATIVE
Protein, ur: NEGATIVE mg/dL
Specific Gravity, Urine: 1.031 — ABNORMAL HIGH (ref 1.005–1.030)
pH: 5 (ref 5.0–8.0)

## 2024-02-11 LAB — COMPREHENSIVE METABOLIC PANEL
ALT: 13 U/L (ref 0–44)
AST: 17 U/L (ref 15–41)
Albumin: 3.4 g/dL — ABNORMAL LOW (ref 3.5–5.0)
Alkaline Phosphatase: 68 U/L (ref 38–126)
Anion gap: 10 (ref 5–15)
BUN: 13 mg/dL (ref 6–20)
CO2: 23 mmol/L (ref 22–32)
Calcium: 9.2 mg/dL (ref 8.9–10.3)
Chloride: 108 mmol/L (ref 98–111)
Creatinine, Ser: 0.61 mg/dL (ref 0.44–1.00)
GFR, Estimated: >60 mL/min (ref >60)
Glucose, Bld: 108 mg/dL — ABNORMAL HIGH (ref 70–99)
Potassium: 3.5 mmol/L (ref 3.5–5.1)
Sodium: 141 mmol/L (ref 135–145)
Total Bilirubin: 0.4 mg/dL (ref 0.0–1.2)
Total Protein: 6.1 g/dL — ABNORMAL LOW (ref 6.5–8.1)

## 2024-02-11 LAB — MAGNESIUM: Magnesium: 1.9 mg/dL (ref 1.7–2.4)

## 2024-02-11 MED ORDER — LEUCOVORIN CALCIUM INJECTION 350 MG
400.0000 mg/m2 | Freq: Once | INTRAMUSCULAR | Status: AC
  Administered 2024-02-11: 748 mg via INTRAVENOUS
  Filled 2024-02-11: qty 37.4

## 2024-02-11 MED ORDER — ATROPINE SULFATE 1 MG/ML IV SOLN
0.5000 mg | Freq: Once | INTRAVENOUS | Status: AC
  Administered 2024-02-11: 0.5 mg via INTRAVENOUS
  Filled 2024-02-11: qty 1

## 2024-02-11 MED ORDER — DEXAMETHASONE SODIUM PHOSPHATE 10 MG/ML IJ SOLN
10.0000 mg | Freq: Once | INTRAMUSCULAR | Status: AC
  Administered 2024-02-11: 10 mg via INTRAVENOUS
  Filled 2024-02-11: qty 1

## 2024-02-11 MED ORDER — SODIUM CHLORIDE 0.9 % IV SOLN
180.0000 mg/m2 | Freq: Once | INTRAVENOUS | Status: AC
  Administered 2024-02-11: 340 mg via INTRAVENOUS
  Filled 2024-02-11: qty 17

## 2024-02-11 MED ORDER — PALONOSETRON HCL INJECTION 0.25 MG/5ML
0.2500 mg | Freq: Once | INTRAVENOUS | Status: AC
  Administered 2024-02-11: 0.25 mg via INTRAVENOUS
  Filled 2024-02-11: qty 5

## 2024-02-11 MED ORDER — SODIUM CHLORIDE 0.9 % IV SOLN
2400.0000 mg/m2 | INTRAVENOUS | Status: DC
  Administered 2024-02-11: 4500 mg via INTRAVENOUS
  Filled 2024-02-11: qty 90

## 2024-02-11 MED ORDER — FLUOROURACIL CHEMO INJECTION 2.5 GM/50ML
400.0000 mg/m2 | Freq: Once | INTRAVENOUS | Status: AC
  Administered 2024-02-11: 750 mg via INTRAVENOUS
  Filled 2024-02-11: qty 15

## 2024-02-11 MED ORDER — SODIUM CHLORIDE 0.9 % IV SOLN: Freq: Once | INTRAVENOUS | Status: AC

## 2024-02-11 NOTE — Patient Instructions (Signed)
 [facility] [place] [facility]  Discharge Instructions: Thank you for choosing [facility] to provide your oncology and hematology care.  If you have a lab appointment with the Cancer Center - please note that after April 8th, 2024, all labs will be drawn in the cancer center.  You do not have to check in or register with the main entrance as you have in the past but will complete your check-in in the cancer center.  Wear comfortable clothing and clothing appropriate for easy access to any Portacath or PICC line.   We strive to give you quality time with your provider. You may need to reschedule your appointment if you arrive late (15 or more minutes).  Arriving late affects you and other patients whose appointments are after yours.  Also, if you miss three or more appointments without notifying the office, you may be dismissed from the clinic at the provider's discretion.      For prescription refill requests, have your pharmacy contact our office and allow 72 hours for refills to be completed.    Today you received the following chemotherapy and/or immunotherapy agents Folfiri    To help prevent nausea and vomiting after your treatment, we encourage you to take your nausea medication as directed.  BELOW ARE SYMPTOMS THAT SHOULD BE REPORTED IMMEDIATELY: *FEVER GREATER THAN 100.4 F (38 C) OR HIGHER *CHILLS OR SWEATING *NAUSEA AND VOMITING THAT IS NOT CONTROLLED WITH YOUR NAUSEA MEDICATION *UNUSUAL SHORTNESS OF BREATH *UNUSUAL BRUISING OR BLEEDING *URINARY PROBLEMS (pain or burning when urinating, or frequent urination) *BOWEL PROBLEMS (unusual diarrhea, constipation, pain near the anus) TENDERNESS IN MOUTH AND THROAT WITH OR WITHOUT PRESENCE OF ULCERS (sore throat, sores in mouth, or a toothache) UNUSUAL RASH, SWELLING OR PAIN  UNUSUAL VAGINAL DISCHARGE OR ITCHING   Items with * indicate a potential emergency and should be followed up as  soon as possible or go to the Emergency Department if any problems should occur.  Please show the CHEMOTHERAPY ALERT CARD or IMMUNOTHERAPY ALERT CARD at check-in to the Emergency Department and triage nurse.  Should you have questions after your visit or need to cancel or reschedule your appointment, please contact [facility] [place] - A DEPT OF Eligha Bridegroom [facility] [phone]  and follow the prompts.  Office hours are 8:00 a.m. to 4:30 p.m. Monday - Friday. Please note that voicemails left after 4:00 p.m. may not be returned until the following business day.  We are closed weekends and major holidays. You have access to a nurse at all times for urgent questions. Please call the main number to the clinic [phone] and follow the prompts.  For any non-urgent questions, you may also contact your provider using MyChart. We now offer e-Visits for anyone 90 and older to request care online for non-urgent symptoms. For details visit [email or web address].   Also download the MyChart app! Go to the app store, search "MyChart", open the app, select [place], and log in with your MyChart username and password.

## 2024-02-11 NOTE — Progress Notes (Signed)
 Patient tolerated chemotherapy with no complaints voiced.  Side effects with management reviewed with understanding verbalized.  Port site clean and dry with no bruising or swelling noted at site.  Good blood return noted before and after administration of chemotherapy.  5FU pump started for home use. Pt due to return to clinic 02/13/24 for pump stop.  Patient left in satisfactory condition with VSS and no s/s of distress noted.   Terri Mckinney [employer]

## 2024-02-11 NOTE — Telephone Encounter (Signed)
 Spoke with patient and relayed that the office sent a referral to [facility] vulva clinic with De Burrs, NP. At fax # [phone] per Dr. Tamela Oddi. Pt can see someone at the pain clinic while waiting for this appt. Or can choose to go to [facility] and see what they say. Pt verbalized understanding and thanked the office for calling.

## 2024-02-12 ENCOUNTER — Other Ambulatory Visit: Payer: BC Managed Care – PPO

## 2024-02-12 ENCOUNTER — Ambulatory Visit: Payer: BC Managed Care – PPO | Admitting: Hematology

## 2024-02-12 ENCOUNTER — Ambulatory Visit: Payer: BC Managed Care – PPO

## 2024-02-12 LAB — CEA: CEA: 27.1 ng/mL — ABNORMAL HIGH (ref 0.0–4.7)

## 2024-02-13 ENCOUNTER — Inpatient Hospital Stay: Payer: BC Managed Care – PPO

## 2024-02-13 VITALS — BP 118/63 | HR 88 | Temp 97.0°F | Resp 18

## 2024-02-13 DIAGNOSIS — Z95828 Presence of other vascular implants and grafts: Secondary | ICD-10-CM

## 2024-02-13 DIAGNOSIS — C20 Malignant neoplasm of rectum: Secondary | ICD-10-CM

## 2024-02-13 DIAGNOSIS — Z5111 Encounter for antineoplastic chemotherapy: Secondary | ICD-10-CM | POA: Diagnosis not present

## 2024-02-13 MED ORDER — SODIUM CHLORIDE 0.9% FLUSH
10.0000 mL | INTRAVENOUS | Status: DC | PRN
  Administered 2024-02-13: 10 mL

## 2024-02-13 MED ORDER — HEPARIN SOD (PORK) LOCK FLUSH 100 UNIT/ML IV SOLN
500.0000 [IU] | Freq: Once | INTRAVENOUS | Status: AC | PRN
Start: 2024-02-13 — End: 2024-02-13
  Administered 2024-02-13: 500 [IU]

## 2024-02-13 NOTE — Telephone Encounter (Signed)
 Spoke with [facility] regarding referral; that office has left the patient a message to call them back. LMOM for the patient with the phone number of the office at [facility]

## 2024-02-13 NOTE — Telephone Encounter (Addendum)
 LMOM at [facility] to call the office back; to check referral status also called on 3/10

## 2024-02-13 NOTE — Progress Notes (Signed)
 Patient presents today for pump d/c. Vital signs are stable. Port a cath site clean, dry, and intact. Port flushed with 10 mls of Normal Saline and 500 Units of Heparin. Needle removed intact. Band aid applied. Patient has no complaints at this time. Discharged from clinic ambulatory and in stable condition. Patient alert and oriented. All follow ups as scheduled.   Terri Mckinney [employer]

## 2024-02-19 NOTE — Telephone Encounter (Signed)
 3/17 at 11 am Spoke with [facility] regarding referral; that office has left the patient a message to call them back.   LMOM for [facility] to call the office

## 2024-02-19 NOTE — Telephone Encounter (Signed)
 LMOM for [facility] to call the office back regarding the referral status  LMOM for the patient to call the office back regarding referrals

## 2024-02-21 NOTE — Telephone Encounter (Signed)
 [facility] called stated patient is approved and will be calling soon to schedule an appointment.

## 2024-02-21 NOTE — Telephone Encounter (Signed)
 Received form from [facility] that the patient denied referral, provider notified

## 2024-02-25 ENCOUNTER — Encounter (HOSPITAL_COMMUNITY): Payer: Self-pay | Admitting: Hematology

## 2024-02-25 NOTE — Telephone Encounter (Signed)
 Patient scheduled to see [facility] on 5/2 at 1:30 pm

## 2024-02-25 NOTE — Progress Notes (Signed)
 [facility] [address] [place] [address]    Clinic Day:  02/26/2024  Referring physician: Junie Spencer, FNP  Patient Care Team: Junie Spencer, FNP as PCP - General (Family Medicine) Terri Massed, MD as Medical Oncologist (Medical Oncology) Therese Sarah, RN as Oncology Nurse Navigator (Medical Oncology)   ASSESSMENT & PLAN:   Assessment: 1. 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  Stools have become bloody/mucousy lately. - She also reported pain in the tailbone region since March 2023.  She also has right-sided lower back pain. - 50 pound weight loss in the last 9 months, part of weight loss was intentional.  She cut back on eating sweets and lost taste to sweets after COVID infection. - CT AP with contrast on 03/08/2022: Irregular circumferential masslike wall thickening of sigmoid colon/rectum with adjacent perirectal adenopathy.  Multiple small hypodense lesions in the liver, largest 2.9 cm in the central aspect of the liver, question metastatic disease. - Colonoscopy on 03/21/2022 by Dr. Marletta Lor: Fungating infiltrative nearly completely obstructing mass in the rectosigmoid colon, mass was circumferential measuring 4 cm in length. - Pathology: Rectal mass biopsy consistent with invasive moderately differentiated adenocarcinoma.  As there is very scant invasive tumor, MSI studies were deferred. - PET scan (03/23/2022): Hypermetabolic rectal primary long-segment with SUV 14.3.  Left posterior perirectal lymph node 7 mm with SUV 2.9.  Multiple tiny foci of hepatic hypermetabolism. - MRI of the liver (04/01/2022): Multiple small hypovascular rim-enhancing liver lesions, predominantly in the right hepatic lobe measuring up to 1.1 cm.  3.3 cm hypervascular mass in the central liver, most consistent with FNH/hepatic adenoma. - Liver biopsy (04/20/2022): Metastatic moderately  differentiated colonic adenocarcinoma with mucinous features - NGS testing shows K-ras G12 R mutation, PIK3CA exon 21 mutation.  MS-stable.  TMB-low. - Cycle 1 of FOLFOXIRI on 05/29/2022, bevacizumab added during cycle 2, cycle 13 on 11/15/2022 -MRI pelvis (10/23/2022): Known metastatic rectal cancer with persistent rectal narrowing and signs of involvement of posterior cervical stroma and anterior peritoneal reflection.  There is evidence of residual tumor in the posterior cervix.  Persistent presacral edema not substantially changed. - MRI liver (11/10/2022): 3 benign lesions and previous small rim-enhancing metastatic lesions in the liver are no longer seen. - Chemoradiation therapy with Xeloda from 12/11/2022 through 01/18/2023. - 04/04/2023: Robotic assisted LAR, diverting ileostomy, TAH with BSO - Pathology: YpT4b, ypN0, YPM1, 0/12 lymph nodes involved, no tumor deposits identified, margins negative, LVI negative, PNI positive, 1.5 cm grade 2 adenocarcinoma in the rectosigmoid colon, no perforation, MMR preserved. - 11/07/2023: Diverting loop ileostomy takedown - CT CAP (12/30/2023): Left hepatic lobe lesion increased 5.2 x 5.1 cm, previously 3.5 x 3 cm.  Posterior right lower lobe lesion measures 1.8 x 1.7, previously 1.6 x 1.4.  Cavitary nodule in the right lung measures 13 x 10 mm, previously 1211 mm.  3 mm nodule in the left upper lobe is now 6 mm.  This was compared to CT from 10/01/2023. - She had 80-month break from chemotherapy due to surgery.   2. Social/family history: - She is separated and is seen today with her daughter.  She works as a Pensions consultant at [employer].  She is current active smoker, 1 pack/day for 27 years.  Denies drinking alcohol. - Paternal aunt had colon cancer.  Maternal cousin has breast cancer.  Maternal uncle had leukemia and maternal cousin had  leukemia.    Plan: Metastatic rectal cancer to the liver: - She has been receiving FOLFIRI consistently since her CT scan on  12/30/2023. - She did not report any chemotherapy related side effects. - Reviewed labs today: Normal LFTs and creatinine.  CBC grossly normal.  CEA went up to 27.1 from 24 previously. - Recommend proceeding with FOLFIRI today and in 2 weeks.  RTC 4 weeks for follow-up.  Will continue to hold bevacizumab.  Will repeat CT CAP with contrast prior to next visit.   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 - She had discussion with Dr. Maisie Fus.  Since her extensive surgery requires colostomy, she had pulled off.  3.  Perirectal pain: - He reported sharp pains in the perineum worse in the last 1 week.  She reported gabapentin helped but she ran out of them. - I will send her gabapentin 300 mg 3 times daily. - I will also send her oxycodone 5 mg every 6 hours as needed.  She took hydrocodone from prior prescription which did not help. - Tingling in the ankles and toes have been stable.    Orders Placed This Encounter  Procedures   CT CHEST ABDOMEN PELVIS W CONTRAST    Standing Status:   Future    Expected Date:   03/28/2024    Expiration Date:   02/25/2025    If indicated for the ordered procedure, I authorize the administration of contrast media per Radiology protocol:   Yes    Does the patient have a contrast media/X-ray dye allergy?:   No    Preferred imaging location?:   [facility]    If indicated for the ordered procedure, I authorize the administration of oral contrast media per Radiology protocol:   Yes   CEA    Standing Status:   Future    Expected Date:   03/25/2024    Expiration Date:   03/25/2025   Magnesium    Standing Status:   Future    Expected Date:   03/25/2024    Expiration Date:   03/25/2025   CBC with Differential    Standing Status:   Future    Expected Date:   03/25/2024    Expiration Date:   03/25/2025   Comprehensive metabolic panel    Standing Status:   Future    Expected Date:   03/25/2024    Expiration Date:    03/25/2025   Urinalysis, dipstick only    Standing Status:   Future    Expected Date:   03/25/2024    Expiration Date:   03/25/2025      Alben Deeds Teague,acting as a scribe for Terri Massed, MD.,have documented all relevant documentation on the behalf of Terri Massed, MD,as directed by  Terri Massed, MD while in the presence of Terri Massed, MD.  I, Terri Massed MD, have reviewed the above documentation for accuracy and completeness, and I agree with the above.      Terri Massed, MD   3/25/20251:55 PM  CHIEF COMPLAINT:   Diagnosis: metastatic rectal cancer to the liver    Cancer Staging  Rectal cancer [facility]) Staging form: Colon and Rectum, AJCC 8th Edition - Clinical stage from 03/28/2022: Stage IVA (cTX, cN1, cM1a) - Unsigned    Prior Therapy: 1. FOLFOXIRI and bevacizumab, 13 cycles, 05/29/22 - 11/15/22  2. Xeloda with radiation therapy, 12/11/22 - 01/18/23 3. LAR with diverting ileostomy and TAH w/ BSO, 04/04/23  Current Therapy:  FOLFIRI   HISTORY OF PRESENT ILLNESS:   Oncology History  Rectal cancer (HCC)  03/28/2022 Initial Diagnosis   Rectal cancer (HCC)   05/29/2022 - 07/26/2022 Chemotherapy   Patient is on Treatment Plan : COLORECTAL FOLFOXIRI + Bevacizumab q14d     05/29/2022 - 11/17/2022 Chemotherapy   Patient is on Treatment Plan : COLORECTAL FOLFOXIRI + Bevacizumab q14d      Genetic Testing   No pathogenic variants identified on the [facility] CustomNext+RNA panel. The report date is 09/08/2022.  The CustomNext-Cancer+RNAinsight panel offered by [facility] includes sequencing and rearrangement analysis for the following 47 genes:  APC, ATM, AXIN2, BARD1, BMPR1A, BRCA1, BRCA2, BRIP1, CDH1, CDK4, CDKN2A, CHEK2, DICER1, EPCAM, GREM1, HOXB13, MEN1, MLH1, MSH2, MSH3, MSH6, MUTYH, NBN, NF1, NF2, NTHL1, PALB2, PMS2, POLD1, POLE, PTEN, RAD51C, RAD51D, RECQL, RET, SDHA, SDHAF2, SDHB, SDHC, SDHD, SMAD4, SMARCA4, STK11, TP53, TSC1, TSC2,  and VHL.  RNA data is routinely analyzed for use in variant interpretation for all genes.   07/16/2023 -  Chemotherapy   Patient is on Treatment Plan : COLORECTAL FOLFIRI + Bevacizumab q14d        INTERVAL HISTORY:   Yoona is a 43 y.o. female presenting to clinic today for follow up of metastatic rectal cancer to the liver. She was last seen by me on 01/29/24.  Today, she states that she is doing well overall. Her appetite level is at 100%. Her energy level is at 75%.  She notes constant sharp perineum pain and pressure that is an 8/10 severity, which has worsened over the past week. Hydrocodone was only mildly effective in improving pain. She has taken gabapentin before. Sakia states she has a BM every other day. She is tolerating treatment well. Ravin is scheduled to see a pain specialist in May 2025 referred by Dr. Tamela Oddi at [facility].   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   Port-A-Cath in place 05/24/2022   upper Rt chest   Tobacco abuse     Surgical History: Past Surgical History:  Procedure Laterality Date   BIOPSY  03/21/2022   Procedure: BIOPSY;  Surgeon: Lanelle Bal, DO;  [facility];  Service: Endoscopy;;   BIOPSY  01/22/2024   Procedure: BIOPSY;  Surgeon: Lanelle Bal, DO;  [facility];  Service: Endoscopy;;   COLONOSCOPY WITH PROPOFOL N/A 03/21/2022   Procedure: COLONOSCOPY WITH PROPOFOL;  Surgeon: Lanelle Bal, DO;  [facility];  Service: Endoscopy;  Laterality: N/A;  8:30am   COLONOSCOPY WITH PROPOFOL N/A 01/22/2024   Procedure: COLONOSCOPY WITH PROPOFOL;  Surgeon: Lanelle Bal, DO;  [facility];  Service: Endoscopy;  Laterality: N/A;  215pm, asa 2   FLEXIBLE SIGMOIDOSCOPY N/A 05/19/2022   Procedure: FLEXIBLE SIGMOIDOSCOPY WITH  TATTOO INJECTION;  Surgeon: Romie Levee, MD;  Location: [facility];  Service: General;  Laterality: N/A;   ILEOSTOMY N/A 04/04/2023   Procedure: DIVERTING ILEOSTOMY;  Surgeon: Romie Levee, MD;  Location: [facility];  Service: General;  Laterality: N/A;   ILEOSTOMY CLOSURE N/A 11/07/2023   Procedure: DIVERTING LOOP ILEOSTOMY TAKEDOWN;  Surgeon: Romie Levee, MD;  Location[facility];  Service: General;  Laterality: N/A;   LAPAROSCOPIC DIVERTED COLOSTOMY N/A 05/19/2022   Procedure: LAPAROSCOPIC DIVERTED OSTOMY;  Surgeon: Romie Levee, MD;  [facility]: [facility];  Service: General;  Laterality: N/A;   PORTACATH PLACEMENT Right 05/19/2022   Procedure: INSERTION PORT-A-CATH;  Surgeon: Romie Levee, MD;  Location: [facility];  Service: General;  Laterality: Right;   ROBOTIC ASSISTED TOTAL HYSTERECTOMY WITH BILATERAL SALPINGO OOPHERECTOMY N/A 04/04/2023   Procedure: XI ROBOTIC ASSISTED TOTAL HYSTERECTOMY WITH BILATERAL SALPINGO OOPHORECTOMY;  Surgeon: Carver Fila, MD;  Location: [facility];  Service: Gynecology;  Laterality: N/A;   XI ROBOTIC ASSISTED LOWER ANTERIOR RESECTION N/A 04/04/2023   Procedure: XI ROBOTIC ASSISTED LOWER ANTERIOR RESECTION;  Surgeon: Romie Levee, MD;  [facility];  Service: General;  Laterality: N/A;    Social History: Social History   Socioeconomic History   Marital status: Married    Spouse name: Not on file   Number of children: Not on file   Years of education: Not on file   Highest education level: Not on file  Occupational History   Not on file  Tobacco Use   Smoking status: Every Day    Current packs/day: 0.25    Average packs/day: 0.3 packs/day for 27.0 years (6.8 ttl pk-yrs)    Types: Cigarettes    Passive exposure: Current   Smokeless tobacco: Never  Vaping Use   Vaping status: Never Used  Substance and Sexual Activity   Alcohol use: No   Drug use: No   Sexual activity: Yes    Birth control/protection: Injection  Other Topics Concern   Not on file   Social History Narrative   Not on file   Social Drivers of Health   Financial Resource Strain: Not on file  Food Insecurity: No Food Insecurity (11/08/2023)   Hunger Vital Sign    Worried About Running Out of Food in the Last Year: Never true    Ran Out of Food in the Last Year: Never true  Transportation Needs: No Transportation Needs (11/08/2023)   PRAPARE - Administrator, Civil Service (Medical): No    Lack of Transportation (Non-Medical): No  Physical Activity: Not on file  Stress: Not on file  Social Connections: Not on file  Intimate Partner Violence: Not At Risk (11/08/2023)   Humiliation, Afraid, Rape, and Kick questionnaire    Fear of Current or Ex-Partner: No    Emotionally Abused: No    Physically Abused: No    Sexually Abused: No    Family History: Family History  Problem Relation Age of Onset   Heart disease Mother    Diabetes Mother    Hearing loss Father    Heart disease Father    Hyperlipidemia Father    Hypertension Father    Stroke Father    Arthritis Father    Diabetes Father    Learning disabilities Brother    Diabetes Maternal Grandmother    Heart disease Maternal Grandmother    Diabetes Maternal Grandfather    Diabetes Paternal Grandmother    Diabetes Paternal Grandfather    Leukemia Maternal Uncle        d. 65s   Colon cancer Paternal Aunt        dx 22s   Breast cancer Cousin 33   Lung cancer Maternal Aunt     Current Medications:  Current Outpatient Medications:    estradiol (ESTRACE VAGINAL) 0.1 MG/GM vaginal cream, 1 week after surgery, begin to use cream nightly at bedtime for 2 weeks then 3 times a week after to assist with healing vagina. Use a fingertip size amount of cream on finger and place slightly past vaginal entrance.  Do not use the applicator that comes with cream., Disp: 42.5 g, Rfl: 3   gabapentin (NEURONTIN) 300 MG capsule, Take 1 capsule (300 mg total) by mouth 3 (three) times daily., Disp: 90 capsule, Rfl:  3   loperamide (IMODIUM) 2 MG capsule, Take 1 capsule (2 mg total) by mouth 4 (four) times daily as needed for diarrhea or loose stools., Disp: 12 capsule, Rfl: 0   Multiple Vitamin (MULTIVITAMIN) tablet, Take 1 tablet by mouth daily., Disp: , Rfl:    oxyCODONE (ROXICODONE) 5 MG immediate release tablet, Take 1 tablet (5 mg total) by mouth every 6 (six) hours as needed for severe pain (pain score 7-10)., Disp: 60 tablet, Rfl: 0 No current facility-administered medications for this visit.  Facility-Administered Medications Ordered in Other Visits:    fluorouracil (ADRUCIL) 4,500 mg in sodium chloride 0.9 % 60 mL chemo infusion, 2,400 mg/m2 (Treatment Plan Recorded), Intravenous, 1 day or 1 dose, Terri Massed, MD, 4,500 mg at 02/26/24 1329   Allergies: Allergies  Allergen Reactions   Wellbutrin [Bupropion]     insomnia   Tramadol Rash    Mainly in chest and neck area    REVIEW OF SYSTEMS:   Review of Systems  Constitutional:  Negative for chills, fatigue and fever.  HENT:   Negative for lump/mass, mouth sores, nosebleeds, sore throat and trouble swallowing.   Eyes:  Negative for eye problems.  Respiratory:  Negative for cough and shortness of breath.   Cardiovascular:  Negative for chest pain, leg swelling and palpitations.  Gastrointestinal:  Positive for rectal pain (8/10 severity). Negative for abdominal pain, constipation, diarrhea, nausea and vomiting.  Genitourinary:  Positive for vaginal bleeding (rectovaginal bleeding from fistula). Negative for bladder incontinence, difficulty urinating, dysuria, frequency, hematuria and nocturia.   Musculoskeletal:  Negative for arthralgias, back pain, flank pain, myalgias and neck pain.  Skin:  Negative for itching and rash.  Neurological:  Positive for numbness (in feet). Negative for dizziness and headaches.  Hematological:  Does not bruise/bleed easily.  Psychiatric/Behavioral:  Positive for sleep disturbance. Negative for  depression and suicidal ideas. The patient is not nervous/anxious.   All other systems reviewed and are negative.    VITALS:   Last menstrual period 04/19/2022.  Wt Readings from Last 3 Encounters:  02/26/24 159 lb 13.3 oz (72.5 kg)  02/11/24 166 lb 10.7 oz (75.6 kg)  02/06/24 161 lb 12.8 oz (73.4 kg)    There is no height or weight on file to calculate BMI.  Performance status (ECOG): 0 - Asymptomatic  PHYSICAL EXAM:   Physical Exam Vitals and nursing note reviewed. Exam conducted with a chaperone present.  Constitutional:      Appearance: Normal appearance.  Cardiovascular:     Rate and Rhythm: Normal rate and regular rhythm.     Pulses: Normal pulses.     Heart sounds: Normal heart sounds.  Pulmonary:     Effort: Pulmonary effort is normal.     Breath sounds: Normal breath sounds.  Abdominal:     Palpations: Abdomen is soft. There is no hepatomegaly, splenomegaly or mass.     Tenderness: There is no abdominal tenderness.  Musculoskeletal:     Right lower leg: No edema.     Left lower leg: No edema.  Lymphadenopathy:     Cervical: No cervical adenopathy.     Right cervical: No superficial, deep or posterior cervical adenopathy.    Left cervical: No superficial, deep or posterior cervical adenopathy.  Upper Body:     Right upper body: No supraclavicular or axillary adenopathy.     Left upper body: No supraclavicular or axillary adenopathy.  Neurological:     General: No focal deficit present.     Mental Status: She is alert and oriented to person, place, and time.  Psychiatric:        Mood and Affect: Mood normal.        Behavior: Behavior normal.     LABS:   CBC     Component Value Date/Time   WBC 5.3 02/26/2024 0915   RBC 3.77 (L) 02/26/2024 0915   HGB 11.7 (L) 02/26/2024 0915   HGB 12.3 03/03/2022 1618   HCT 36.5 02/26/2024 0915   HCT 35.4 03/03/2022 1618   PLT 266 02/26/2024 0915   PLT 351 03/03/2022 1618   MCV 96.8 02/26/2024 0915   MCV 91  03/03/2022 1618   MCH 31.0 02/26/2024 0915   MCHC 32.1 02/26/2024 0915   RDW 16.1 (H) 02/26/2024 0915   RDW 12.1 03/03/2022 1618   LYMPHSABS 1.0 02/26/2024 0915   LYMPHSABS 3.7 (H) 03/03/2022 1618   MONOABS 0.3 02/26/2024 0915   EOSABS 0.2 02/26/2024 0915   EOSABS 0.9 (H) 03/03/2022 1618   BASOSABS 0.1 02/26/2024 0915   BASOSABS 0.1 03/03/2022 1618    CMP      Component Value Date/Time   NA 139 02/26/2024 0915   NA 144 03/03/2022 1618   K 3.9 02/26/2024 0915   CL 106 02/26/2024 0915   CO2 23 02/26/2024 0915   GLUCOSE 92 02/26/2024 0915   BUN 11 02/26/2024 0915   BUN 10 03/03/2022 1618   CREATININE 0.61 02/26/2024 0915   CALCIUM 9.2 02/26/2024 0915   PROT 6.5 02/26/2024 0915   PROT 5.8 (L) 03/03/2022 1618   ALBUMIN 3.6 02/26/2024 0915   ALBUMIN 4.0 03/03/2022 1618   AST 16 02/26/2024 0915   ALT 17 02/26/2024 0915   ALKPHOS 65 02/26/2024 0915   BILITOT 0.4 02/26/2024 0915   BILITOT <0.2 03/03/2022 1618   GFRNONAA >60 02/26/2024 0915   GFRAA 97 07/16/2020 1604     Lab Results  Component Value Date   CEA1 27.1 (H) 02/11/2024   /  CEA  Date Value Ref Range Status  02/11/2024 27.1 (H) 0.0 - 4.7 ng/mL Final    Comment:    (NOTE)                             Nonsmokers          <3.9                             Smokers             <5.6 [employer] Electrochemiluminescence Immunoassay (ECLIA) Values obtained with different assay methods or kits cannot be used interchangeably.  Results cannot be interpreted as absolute evidence of the presence or absence of malignant disease. Performed At: [facility] [address] [place], [place] [id number] Jolene Schimke MD HQ:[id number]    No results found for: "PSA1" No results found for: [phone]" No results found for: "CAN125"  No results found for: "TOTALPROTELP", "ALBUMINELP", "A1GS", "A2GS", "BETS", "BETA2SER", "GAMS", "MSPIKE", "SPEI" No results found for: "TIBC", "FERRITIN", "IRONPCTSAT" No results  found for: "LDH"   STUDIES:   No results found.

## 2024-02-26 ENCOUNTER — Encounter (HOSPITAL_COMMUNITY): Payer: Self-pay | Admitting: Hematology

## 2024-02-26 ENCOUNTER — Inpatient Hospital Stay: Payer: BC Managed Care – PPO | Admitting: Hematology

## 2024-02-26 ENCOUNTER — Telehealth: Payer: Self-pay

## 2024-02-26 ENCOUNTER — Inpatient Hospital Stay

## 2024-02-26 ENCOUNTER — Inpatient Hospital Stay: Admitting: Hematology

## 2024-02-26 ENCOUNTER — Inpatient Hospital Stay: Payer: BC Managed Care – PPO

## 2024-02-26 VITALS — BP 103/58 | HR 79 | Temp 98.2°F | Resp 18

## 2024-02-26 DIAGNOSIS — Z95828 Presence of other vascular implants and grafts: Secondary | ICD-10-CM

## 2024-02-26 DIAGNOSIS — C20 Malignant neoplasm of rectum: Secondary | ICD-10-CM

## 2024-02-26 DIAGNOSIS — Z5111 Encounter for antineoplastic chemotherapy: Secondary | ICD-10-CM | POA: Diagnosis not present

## 2024-02-26 LAB — COMPREHENSIVE METABOLIC PANEL
ALT: 17 U/L (ref 0–44)
AST: 16 U/L (ref 15–41)
Albumin: 3.6 g/dL (ref 3.5–5.0)
Alkaline Phosphatase: 65 U/L (ref 38–126)
Anion gap: 10 (ref 5–15)
BUN: 11 mg/dL (ref 6–20)
CO2: 23 mmol/L (ref 22–32)
Calcium: 9.2 mg/dL (ref 8.9–10.3)
Chloride: 106 mmol/L (ref 98–111)
Creatinine, Ser: 0.61 mg/dL (ref 0.44–1.00)
GFR, Estimated: >60 mL/min (ref >60)
Glucose, Bld: 92 mg/dL (ref 70–99)
Potassium: 3.9 mmol/L (ref 3.5–5.1)
Sodium: 139 mmol/L (ref 135–145)
Total Bilirubin: 0.4 mg/dL (ref 0.0–1.2)
Total Protein: 6.5 g/dL (ref 6.5–8.1)

## 2024-02-26 LAB — CBC WITH DIFFERENTIAL/PLATELET
Abs Immature Granulocytes: 0.02 10*3/uL (ref 0.00–0.07)
Basophils Absolute: 0.1 10*3/uL (ref 0.0–0.1)
Basophils Relative: 1 %
Eosinophils Absolute: 0.2 10*3/uL (ref 0.0–0.5)
Eosinophils Relative: 5 %
HCT: 36.5 % (ref 36.0–46.0)
Hemoglobin: 11.7 g/dL — ABNORMAL LOW (ref 12.0–15.0)
Immature Granulocytes: 0 %
Lymphocytes Relative: 18 %
Lymphs Abs: 1 10*3/uL (ref 0.7–4.0)
MCH: 31 pg (ref 26.0–34.0)
MCHC: 32.1 g/dL (ref 30.0–36.0)
MCV: 96.8 fL (ref 80.0–100.0)
Monocytes Absolute: 0.3 10*3/uL (ref 0.1–1.0)
Monocytes Relative: 6 %
Neutro Abs: 3.7 10*3/uL (ref 1.7–7.7)
Neutrophils Relative %: 70 %
Platelets: 266 10*3/uL (ref 150–400)
RBC: 3.77 MIL/uL — ABNORMAL LOW (ref 3.87–5.11)
RDW: 16.1 % — ABNORMAL HIGH (ref 11.5–15.5)
WBC: 5.3 10*3/uL (ref 4.0–10.5)
nRBC: 0 % (ref 0.0–0.2)

## 2024-02-26 LAB — MAGNESIUM: Magnesium: 2.2 mg/dL (ref 1.7–2.4)

## 2024-02-26 MED ORDER — GABAPENTIN 300 MG PO CAPS: 300.0000 mg | ORAL_CAPSULE | Freq: Three times a day (TID) | ORAL | 3 refills | Status: DC

## 2024-02-26 MED ORDER — DEXAMETHASONE SODIUM PHOSPHATE 10 MG/ML IJ SOLN
10.0000 mg | Freq: Once | INTRAMUSCULAR | Status: AC
  Administered 2024-02-26: 10 mg via INTRAVENOUS
  Filled 2024-02-26: qty 1

## 2024-02-26 MED ORDER — SODIUM CHLORIDE 0.9 % IV SOLN
180.0000 mg/m2 | Freq: Once | INTRAVENOUS | Status: AC
  Administered 2024-02-26: 340 mg via INTRAVENOUS
  Filled 2024-02-26: qty 17

## 2024-02-26 MED ORDER — ATROPINE SULFATE 1 MG/ML IV SOLN
0.5000 mg | Freq: Once | INTRAVENOUS | Status: AC
  Administered 2024-02-26: 0.5 mg via INTRAVENOUS
  Filled 2024-02-26: qty 1

## 2024-02-26 MED ORDER — SODIUM CHLORIDE 0.9 % IV SOLN: Freq: Once | INTRAVENOUS | Status: AC

## 2024-02-26 MED ORDER — SODIUM CHLORIDE 0.9 % IV SOLN
400.0000 mg/m2 | Freq: Once | INTRAVENOUS | Status: AC
  Administered 2024-02-26: 748 mg via INTRAVENOUS
  Filled 2024-02-26: qty 37.4

## 2024-02-26 MED ORDER — OXYCODONE HCL 5 MG PO TABS: 5.0000 mg | ORAL_TABLET | Freq: Four times a day (QID) | ORAL | 0 refills | Status: DC | PRN

## 2024-02-26 MED ORDER — SODIUM CHLORIDE 0.9 % IV SOLN
2400.0000 mg/m2 | INTRAVENOUS | Status: DC
  Administered 2024-02-26: 4500 mg via INTRAVENOUS
  Filled 2024-02-26: qty 90

## 2024-02-26 MED ORDER — FLUOROURACIL CHEMO INJECTION 2.5 GM/50ML
400.0000 mg/m2 | Freq: Once | INTRAVENOUS | Status: AC
  Administered 2024-02-26: 750 mg via INTRAVENOUS
  Filled 2024-02-26: qty 15

## 2024-02-26 MED ORDER — PALONOSETRON HCL INJECTION 0.25 MG/5ML
0.2500 mg | Freq: Once | INTRAVENOUS | Status: AC
  Administered 2024-02-26: 0.25 mg via INTRAVENOUS
  Filled 2024-02-26: qty 5

## 2024-02-26 NOTE — Telephone Encounter (Signed)
 Notified Patient of prior authorization approval for Oxycodone HCL (IR) 5mg  Tablets. Pharmacy notified. No other needs or concerns noted at this time.

## 2024-02-26 NOTE — Patient Instructions (Signed)
 [facility] - A DEPT OF [facility]  Discharge Instructions: Thank you for choosing [facility] to provide your oncology and hematology care.  If you have a lab appointment with the Cancer Center - please note that after April 8th, 2024, all labs will be drawn in the cancer center.  You do not have to check in or register with the main entrance as you have in the past but will complete your check-in in the cancer center.  Wear comfortable clothing and clothing appropriate for easy access to any Portacath or PICC line.   We strive to give you quality time with your provider. You may need to reschedule your appointment if you arrive late (15 or more minutes).  Arriving late affects you and other patients whose appointments are after yours.  Also, if you miss three or more appointments without notifying the office, you may be dismissed from the clinic at the provider's discretion.      For prescription refill requests, have your pharmacy contact our office and allow 72 hours for refills to be completed.    Today you received the following chemotherapy and/or immunotherapy agents FOLFIRI, return as scheduled.    To help prevent nausea and vomiting after your treatment, we encourage you to take your nausea medication as directed.  BELOW ARE SYMPTOMS THAT SHOULD BE REPORTED IMMEDIATELY: *FEVER GREATER THAN 100.4 F (38 C) OR HIGHER *CHILLS OR SWEATING *NAUSEA AND VOMITING THAT IS NOT CONTROLLED WITH YOUR NAUSEA MEDICATION *UNUSUAL SHORTNESS OF BREATH *UNUSUAL BRUISING OR BLEEDING *URINARY PROBLEMS (pain or burning when urinating, or frequent urination) *BOWEL PROBLEMS (unusual diarrhea, constipation, pain near the anus) TENDERNESS IN MOUTH AND THROAT WITH OR WITHOUT PRESENCE OF ULCERS (sore throat, sores in mouth, or a toothache) UNUSUAL RASH, SWELLING OR PAIN  UNUSUAL VAGINAL DISCHARGE OR ITCHING   Items with * indicate a potential emergency and  should be followed up as soon as possible or go to the Emergency Department if any problems should occur.  Please show the CHEMOTHERAPY ALERT CARD or IMMUNOTHERAPY ALERT CARD at check-in to the Emergency Department and triage nurse.  Should you have questions after your visit or need to cancel or reschedule your appointment, please contact [facility]  - A DEPT OF [facility] [phone]  and follow the prompts.  Office hours are 8:00 a.m. to 4:30 p.m. Monday - Friday. Please note that voicemails left after 4:00 p.m. may not be returned until the following business day.  We are closed weekends and major holidays. You have access to a nurse at all times for urgent questions. Please call the main number to the clinic [phone] and follow the prompts.  For any non-urgent questions, you may also contact your provider using MyChart. We now offer e-Visits for anyone 21 and older to request care online for non-urgent symptoms. For details visit [email or web address].   Also download the MyChart app! Go to the app store, search "MyChart", open the app, select [facility], and log in with your MyChart username and password.

## 2024-02-26 NOTE — Progress Notes (Signed)
 Patient okay for treatment per Dr. Ellin Saba. Patient tolerated chemotherapy with no complaints voiced. Side effects with management reviewed understanding verbalized. Port site clean and dry with no bruising or swelling noted at site. Good blood return noted before and after administration of chemotherapy. Chemo pump connected with no alarms noted. Patient left in satisfactory condition with VSS and no s/s of distress noted.

## 2024-02-26 NOTE — Patient Instructions (Signed)

## 2024-02-26 NOTE — Progress Notes (Signed)
 Patient has been examined by Dr. Ellin Saba. Vital signs and labs have been reviewed by MD - ANC, Creatinine, LFTs, hemoglobin, and platelets are within treatment parameters per M.D. - pt may proceed with treatment.  Primary RN and pharmacy notified.

## 2024-02-27 ENCOUNTER — Other Ambulatory Visit: Payer: Self-pay

## 2024-02-28 ENCOUNTER — Inpatient Hospital Stay: Payer: BC Managed Care – PPO

## 2024-02-28 DIAGNOSIS — Z5111 Encounter for antineoplastic chemotherapy: Secondary | ICD-10-CM | POA: Diagnosis not present

## 2024-02-28 MED ORDER — SODIUM CHLORIDE 0.9% FLUSH
10.0000 mL | Freq: Once | INTRAVENOUS | Status: AC
  Administered 2024-02-28: 10 mL via INTRAVENOUS

## 2024-02-28 MED ORDER — HEPARIN SOD (PORK) LOCK FLUSH 100 UNIT/ML IV SOLN
500.0000 [IU] | Freq: Once | INTRAVENOUS | Status: AC
  Administered 2024-02-28: 500 [IU] via INTRAVENOUS

## 2024-02-28 NOTE — Patient Instructions (Signed)
 [facility] [place] [facility]  Discharge Instructions: Thank you for choosing [facility] to provide your oncology and hematology care.  If you have a lab appointment with the Cancer Center - please note that after April 8th, 2024, all labs will be drawn in the cancer center.  You do not have to check in or register with the main entrance as you have in the past but will complete your check-in in the cancer center.  Wear comfortable clothing and clothing appropriate for easy access to any Portacath or PICC line.   We strive to give you quality time with your provider. You may need to reschedule your appointment if you arrive late (15 or more minutes).  Arriving late affects you and other patients whose appointments are after yours.  Also, if you miss three or more appointments without notifying the office, you may be dismissed from the clinic at the provider's discretion.      For prescription refill requests, have your pharmacy contact our office and allow 72 hours for refills to be completed.    Today you received the following chemotherapy and/or immunotherapy agents 5FU pump stop      To help prevent nausea and vomiting after your treatment, we encourage you to take your nausea medication as directed.  BELOW ARE SYMPTOMS THAT SHOULD BE REPORTED IMMEDIATELY: *FEVER GREATER THAN 100.4 F (38 C) OR HIGHER *CHILLS OR SWEATING *NAUSEA AND VOMITING THAT IS NOT CONTROLLED WITH YOUR NAUSEA MEDICATION *UNUSUAL SHORTNESS OF BREATH *UNUSUAL BRUISING OR BLEEDING *URINARY PROBLEMS (pain or burning when urinating, or frequent urination) *BOWEL PROBLEMS (unusual diarrhea, constipation, pain near the anus) TENDERNESS IN MOUTH AND THROAT WITH OR WITHOUT PRESENCE OF ULCERS (sore throat, sores in mouth, or a toothache) UNUSUAL RASH, SWELLING OR PAIN  UNUSUAL VAGINAL DISCHARGE OR ITCHING   Items with * indicate a potential emergency and should be  followed up as soon as possible or go to the Emergency Department if any problems should occur.  Please show the CHEMOTHERAPY ALERT CARD or IMMUNOTHERAPY ALERT CARD at check-in to the Emergency Department and triage nurse.  Should you have questions after your visit or need to cancel or reschedule your appointment, please contact [facility]ER [facility] [place] - A DEPT OF Eligha Bridegroom [facility] [phone]  and follow the prompts.  Office hours are 8:00 a.m. to 4:30 p.m. Monday - Friday. Please note that voicemails left after 4:00 p.m. may not be returned until the following business day.  We are closed weekends and major holidays. You have access to a nurse at all times for urgent questions. Please call the main number to the clinic [phone] and follow the prompts.  For any non-urgent questions, you may also contact your provider using MyChart. We now offer e-Visits for anyone 27 and older to request care online for non-urgent symptoms. For details visit [email or web address].   Also download the MyChart app! Go to the app store, search "MyChart", open the app, select [place], and log in with your MyChart username and password.

## 2024-02-28 NOTE — Progress Notes (Signed)
 Patient presents today for 5FU pump stop and disconnection after 46 hour continous infusion.   5FU pump deaccessed.  Patients port flushed without difficulty.  Good blood return noted with no bruising or swelling noted at site.  Needle removed intact.  Band aid applied.  VSS with discharge and left in satisfactory condition via wheelchair with no s/s of distress noted.

## 2024-03-11 ENCOUNTER — Inpatient Hospital Stay: Payer: BC Managed Care – PPO | Admitting: Hematology

## 2024-03-11 ENCOUNTER — Inpatient Hospital Stay: Payer: BC Managed Care – PPO | Attending: Hematology

## 2024-03-11 ENCOUNTER — Encounter (HOSPITAL_COMMUNITY): Payer: Self-pay | Admitting: Hematology

## 2024-03-11 ENCOUNTER — Inpatient Hospital Stay: Payer: BC Managed Care – PPO

## 2024-03-11 VITALS — BP 99/56 | HR 72 | Temp 98.1°F | Resp 18

## 2024-03-11 DIAGNOSIS — C20 Malignant neoplasm of rectum: Secondary | ICD-10-CM

## 2024-03-11 DIAGNOSIS — Z95828 Presence of other vascular implants and grafts: Secondary | ICD-10-CM

## 2024-03-11 DIAGNOSIS — Z5111 Encounter for antineoplastic chemotherapy: Secondary | ICD-10-CM | POA: Diagnosis present

## 2024-03-11 DIAGNOSIS — C787 Secondary malignant neoplasm of liver and intrahepatic bile duct: Secondary | ICD-10-CM | POA: Insufficient documentation

## 2024-03-11 LAB — CBC WITH DIFFERENTIAL/PLATELET
Abs Immature Granulocytes: 0.01 10*3/uL (ref 0.00–0.07)
Basophils Absolute: 0 10*3/uL (ref 0.0–0.1)
Basophils Relative: 1 %
Eosinophils Absolute: 0.4 10*3/uL (ref 0.0–0.5)
Eosinophils Relative: 9 %
HCT: 35.9 % — ABNORMAL LOW (ref 36.0–46.0)
Hemoglobin: 11.5 g/dL — ABNORMAL LOW (ref 12.0–15.0)
Immature Granulocytes: 0 %
Lymphocytes Relative: 19 %
Lymphs Abs: 0.8 10*3/uL (ref 0.7–4.0)
MCH: 31 pg (ref 26.0–34.0)
MCHC: 32 g/dL (ref 30.0–36.0)
MCV: 96.8 fL (ref 80.0–100.0)
Monocytes Absolute: 0.3 10*3/uL (ref 0.1–1.0)
Monocytes Relative: 8 %
Neutro Abs: 2.8 10*3/uL (ref 1.7–7.7)
Neutrophils Relative %: 63 %
Platelets: 302 10*3/uL (ref 150–400)
RBC: 3.71 MIL/uL — ABNORMAL LOW (ref 3.87–5.11)
RDW: 15.9 % — ABNORMAL HIGH (ref 11.5–15.5)
WBC: 4.4 10*3/uL (ref 4.0–10.5)
nRBC: 0 % (ref 0.0–0.2)

## 2024-03-11 LAB — URINALYSIS, DIPSTICK ONLY
Bilirubin Urine: NEGATIVE
Glucose, UA: NEGATIVE mg/dL
Ketones, ur: NEGATIVE mg/dL
Leukocytes,Ua: NEGATIVE
Nitrite: NEGATIVE
Protein, ur: NEGATIVE mg/dL
Specific Gravity, Urine: >1.03 — ABNORMAL HIGH (ref 1.005–1.030)
pH: 6 (ref 5.0–8.0)

## 2024-03-11 LAB — MAGNESIUM: Magnesium: 2.2 mg/dL (ref 1.7–2.4)

## 2024-03-11 LAB — COMPREHENSIVE METABOLIC PANEL WITH GFR
ALT: 15 U/L (ref 0–44)
AST: 16 U/L (ref 15–41)
Albumin: 3.4 g/dL — ABNORMAL LOW (ref 3.5–5.0)
Alkaline Phosphatase: 78 U/L (ref 38–126)
Anion gap: 11 (ref 5–15)
BUN: 12 mg/dL (ref 6–20)
CO2: 22 mmol/L (ref 22–32)
Calcium: 9.4 mg/dL (ref 8.9–10.3)
Chloride: 105 mmol/L (ref 98–111)
Creatinine, Ser: 0.62 mg/dL (ref 0.44–1.00)
GFR, Estimated: >60 mL/min (ref >60)
Glucose, Bld: 115 mg/dL — ABNORMAL HIGH (ref 70–99)
Potassium: 3.8 mmol/L (ref 3.5–5.1)
Sodium: 138 mmol/L (ref 135–145)
Total Bilirubin: 0.3 mg/dL (ref 0.0–1.2)
Total Protein: 6.9 g/dL (ref 6.5–8.1)

## 2024-03-11 MED ORDER — ATROPINE SULFATE 1 MG/ML IV SOLN
0.5000 mg | Freq: Once | INTRAVENOUS | Status: AC
  Administered 2024-03-11: 0.5 mg via INTRAVENOUS
  Filled 2024-03-11: qty 1

## 2024-03-11 MED ORDER — HEPARIN SOD (PORK) LOCK FLUSH 100 UNIT/ML IV SOLN: 500.0000 [IU] | Freq: Once | INTRAVENOUS | Status: DC | PRN

## 2024-03-11 MED ORDER — PALONOSETRON HCL INJECTION 0.25 MG/5ML
0.2500 mg | Freq: Once | INTRAVENOUS | Status: AC
  Administered 2024-03-11: 0.25 mg via INTRAVENOUS
  Filled 2024-03-11: qty 5

## 2024-03-11 MED ORDER — SODIUM CHLORIDE 0.9% FLUSH: 10.0000 mL | INTRAVENOUS | Status: DC | PRN

## 2024-03-11 MED ORDER — IRINOTECAN HCL CHEMO INJECTION 100 MG/5ML
180.0000 mg/m2 | Freq: Once | INTRAVENOUS | Status: AC
  Administered 2024-03-11: 340 mg via INTRAVENOUS
  Filled 2024-03-11: qty 17

## 2024-03-11 MED ORDER — FLUOROURACIL CHEMO INJECTION 2.5 GM/50ML
400.0000 mg/m2 | Freq: Once | INTRAVENOUS | Status: AC
  Administered 2024-03-11: 750 mg via INTRAVENOUS
  Filled 2024-03-11: qty 15

## 2024-03-11 MED ORDER — DEXAMETHASONE SODIUM PHOSPHATE 10 MG/ML IJ SOLN
10.0000 mg | Freq: Once | INTRAMUSCULAR | Status: AC
  Administered 2024-03-11: 10 mg via INTRAVENOUS
  Filled 2024-03-11: qty 1

## 2024-03-11 MED ORDER — SODIUM CHLORIDE 0.9 % IV SOLN
400.0000 mg/m2 | Freq: Once | INTRAVENOUS | Status: AC
  Administered 2024-03-11: 748 mg via INTRAVENOUS
  Filled 2024-03-11: qty 37.4

## 2024-03-11 MED ORDER — SODIUM CHLORIDE 0.9 % IV SOLN: Freq: Once | INTRAVENOUS | Status: AC

## 2024-03-11 MED ORDER — SODIUM CHLORIDE 0.9% FLUSH
10.0000 mL | Freq: Once | INTRAVENOUS | Status: AC
  Administered 2024-03-11: 10 mL via INTRAVENOUS

## 2024-03-11 MED ORDER — SODIUM CHLORIDE 0.9 % IV SOLN
2400.0000 mg/m2 | INTRAVENOUS | Status: DC
  Administered 2024-03-11: 4500 mg via INTRAVENOUS
  Filled 2024-03-11: qty 90

## 2024-03-11 NOTE — Patient Instructions (Signed)
 [facility] [place] - [facility]  Discharge Instructions: Thank you for choosing [facility] to provide your oncology and hematology care.  If you have a lab appointment with the Cancer Center - please note that after April 8th, 2024, all labs will be drawn in the cancer center.  You do not have to check in or register with the main entrance as you have in the past but will complete your check-in in the cancer center.  Wear comfortable clothing and clothing appropriate for easy access to any Portacath or PICC line.   We strive to give you quality time with your provider. You may need to reschedule your appointment if you arrive late (15 or more minutes).  Arriving late affects you and other patients whose appointments are after yours.  Also, if you miss three or more appointments without notifying the office, you may be dismissed from the clinic at the provider's discretion.      For prescription refill requests, have your pharmacy contact our office and allow 72 hours for refills to be completed.    Today you received the following chemotherapy and/or immunotherapy agents folfiri      To help prevent nausea and vomiting after your treatment, we encourage you to take your nausea medication as directed.  BELOW ARE SYMPTOMS THAT SHOULD BE REPORTED IMMEDIATELY: *FEVER GREATER THAN 100.4 F (38 C) OR HIGHER *CHILLS OR SWEATING *NAUSEA AND VOMITING THAT IS NOT CONTROLLED WITH YOUR NAUSEA MEDICATION *UNUSUAL SHORTNESS OF BREATH *UNUSUAL BRUISING OR BLEEDING *URINARY PROBLEMS (pain or burning when urinating, or frequent urination) *BOWEL PROBLEMS (unusual diarrhea, constipation, pain near the anus) TENDERNESS IN MOUTH AND THROAT WITH OR WITHOUT PRESENCE OF ULCERS (sore throat, sores in mouth, or a toothache) UNUSUAL RASH, SWELLING OR PAIN  UNUSUAL VAGINAL DISCHARGE OR ITCHING   Items with * indicate a potential emergency and should be followed up  as soon as possible or go to the Emergency Department if any problems should occur.  Please show the CHEMOTHERAPY ALERT CARD or IMMUNOTHERAPY ALERT CARD at check-in to the Emergency Department and triage nurse.  Should you have questions after your visit or need to cancel or reschedule your appointment, please contact [facility]CER CTR [place] - A DEPT OF Eligha Bridegroom [facility] [phone]  and follow the prompts.  Office hours are 8:00 a.m. to 4:30 p.m. Monday - Friday. Please note that voicemails left after 4:00 p.m. may not be returned until the following business day.  We are closed weekends and major holidays. You have access to a nurse at all times for urgent questions. Please call the main number to the clinic [phone] and follow the prompts.  For any non-urgent questions, you may also contact your provider using MyChart. We now offer e-Visits for anyone 107 and older to request care online for non-urgent symptoms. For details visit [email or web address].   Also download the MyChart app! Go to the app store, search "MyChart", open the app, select [place], and log in with your MyChart username and password.

## 2024-03-11 NOTE — Progress Notes (Signed)
 Patient presents today for Folfiri infusion with 5FU pump start per providers order.  Vital signs and labs within parameters for treatment.  Patient has no new complaints at this time.  Treatment given today per MD orders.  Stable during infusion without adverse affects.  Vital signs stable.  5FU pump connected and verified RUN on the screen with the patient.  No complaints at this time.  Discharge from clinic ambulatory in stable condition.  Alert and oriented X 3.  Follow up with [facility] as scheduled.

## 2024-03-12 LAB — CEA: CEA: 37.5 ng/mL — ABNORMAL HIGH (ref 0.0–4.7)

## 2024-03-13 ENCOUNTER — Inpatient Hospital Stay: Payer: BC Managed Care – PPO

## 2024-03-13 VITALS — BP 105/59 | HR 95 | Temp 96.9°F | Resp 18

## 2024-03-13 DIAGNOSIS — Z95828 Presence of other vascular implants and grafts: Secondary | ICD-10-CM

## 2024-03-13 DIAGNOSIS — Z5111 Encounter for antineoplastic chemotherapy: Secondary | ICD-10-CM | POA: Diagnosis not present

## 2024-03-13 DIAGNOSIS — C20 Malignant neoplasm of rectum: Secondary | ICD-10-CM

## 2024-03-13 MED ORDER — SODIUM CHLORIDE 0.9% FLUSH
10.0000 mL | INTRAVENOUS | Status: DC | PRN
  Administered 2024-03-13: 10 mL

## 2024-03-13 MED ORDER — HEPARIN SOD (PORK) LOCK FLUSH 100 UNIT/ML IV SOLN
500.0000 [IU] | Freq: Once | INTRAVENOUS | Status: AC | PRN
  Administered 2024-03-13: 500 [IU]

## 2024-03-13 NOTE — Patient Instructions (Signed)
 [facility] [place] [facility]  Discharge Instructions: Thank you for choosing [facility] to provide your oncology and hematology care.  If you have a lab appointment with the Cancer Center - please note that after April 8th, 2024, all labs will be drawn in the cancer center.  You do not have to check in or register with the main entrance as you have in the past but will complete your check-in in the cancer center.  Wear comfortable clothing and clothing appropriate for easy access to any Portacath or PICC line.   We strive to give you quality time with your provider. You may need to reschedule your appointment if you arrive late (15 or more minutes).  Arriving late affects you and other patients whose appointments are after yours.  Also, if you miss three or more appointments without notifying the office, you may be dismissed from the clinic at the provider's discretion.      For prescription refill requests, have your pharmacy contact our office and allow 72 hours for refills to be completed.    Today you received the following chemotherapy and/or immunotherapy agents Adrucil. Fluorouracil Injection What is this medication? FLUOROURACIL (flure oh YOOR a sil) treats some types of cancer. It works by slowing down the growth of cancer cells. This medicine may be used for other purposes; ask your health care provider or pharmacist if you have questions. COMMON BRAND NAME(S): Adrucil What should I tell my care team before I take this medication? They need to know if you have any of these conditions: Blood disorders Dihydropyrimidine dehydrogenase (DPD) deficiency Infection, such as chickenpox, cold sores, herpes Kidney disease Liver disease Poor nutrition Recent or ongoing radiation therapy An unusual or allergic reaction to fluorouracil, other medications, foods, dyes, or preservatives If you or your partner are pregnant or trying to get  pregnant Breast-feeding How should I use this medication? This medication is injected into a vein. It is administered by your care team in a hospital or clinic setting. Talk to your care team about the use of this medication in children. Special care may be needed. Overdosage: If you think you have taken too much of this medicine contact a poison control center or emergency room at once. NOTE: This medicine is only for you. Do not share this medicine with others. What if I miss a dose? Keep appointments for follow-up doses. It is important not to miss your dose. Call your care team if you are unable to keep an appointment. What may interact with this medication? Do not take this medication with any of the following: Live virus vaccines This medication may also interact with the following: Medications that treat or prevent blood clots, such as warfarin, enoxaparin, dalteparin This list may not describe all possible interactions. Give your health care provider a list of all the medicines, herbs, non-prescription drugs, or dietary supplements you use. Also tell them if you smoke, drink alcohol, or use illegal drugs. Some items may interact with your medicine. What should I watch for while using this medication? Your condition will be monitored carefully while you are receiving this medication. This medication may make you feel generally unwell. This is not uncommon as chemotherapy can affect healthy cells as well as cancer cells. Report any side effects. Continue your course of treatment even though you feel ill unless your care team tells you to stop. In some cases, you may be given additional  medications to help with side effects. Follow all directions for their use. This medication may increase your risk of getting an infection. Call your care team for advice if you get a fever, chills, sore throat, or other symptoms of a cold or flu. Do not treat yourself. Try to avoid being around people who are  sick. This medication may increase your risk to bruise or bleed. Call your care team if you notice any unusual bleeding. Be careful brushing or flossing your teeth or using a toothpick because you may get an infection or bleed more easily. If you have any dental work done, tell your dentist you are receiving this medication. Avoid taking medications that contain aspirin, acetaminophen, ibuprofen, naproxen, or ketoprofen unless instructed by your care team. These medications may hide a fever. Do not treat diarrhea with over the counter products. Contact your care team if you have diarrhea that lasts more than 2 days or if it is severe and watery. This medication can make you more sensitive to the sun. Keep out of the sun. If you cannot avoid being in the sun, wear protective clothing and sunscreen. Do not use sun lamps, tanning beds, or tanning booths. Talk to your care team if you or your partner wish to become pregnant or think you might be pregnant. This medication can cause serious birth defects if taken during pregnancy and for 3 months after the last dose. A reliable form of contraception is recommended while taking this medication and for 3 months after the last dose. Talk to your care team about effective forms of contraception. Do not father a child while taking this medication and for 3 months after the last dose. Use a condom while having sex during this time period. Do not breastfeed while taking this medication. This medication may cause infertility. Talk to your care team if you are concerned about your fertility. What side effects may I notice from receiving this medication? Side effects that you should report to your care team as soon as possible: Allergic reactions--skin rash, itching, hives, swelling of the face, lips, tongue, or throat Heart attack--pain or tightness in the chest, shoulders, arms, or jaw, nausea, shortness of breath, cold or clammy skin, feeling faint or  lightheaded Heart failure--shortness of breath, swelling of the ankles, feet, or hands, sudden weight gain, unusual weakness or fatigue Heart rhythm changes--fast or irregular heartbeat, dizziness, feeling faint or lightheaded, chest pain, trouble breathing High ammonia level--unusual weakness or fatigue, confusion, loss of appetite, nausea, vomiting, seizures Infection--fever, chills, cough, sore throat, wounds that don't heal, pain or trouble when passing urine, general feeling of discomfort or being unwell Low red blood cell level--unusual weakness or fatigue, dizziness, headache, trouble breathing Pain, tingling, or numbness in the hands or feet, muscle weakness, change in vision, confusion or trouble speaking, loss of balance or coordination, trouble walking, seizures Redness, swelling, and blistering of the skin over hands and feet Severe or prolonged diarrhea Unusual bruising or bleeding Side effects that usually do not require medical attention (report to your care team if they continue or are bothersome): Dry skin Headache Increased tears Nausea Pain, redness, or swelling with sores inside the mouth or throat Sensitivity to light Vomiting This list may not describe all possible side effects. Call your doctor for medical advice about side effects. You may report side effects to FDA at 1-800-FDA-1088. Where should I keep my medication? This medication is given in a hospital or clinic. It will not be stored at home.  NOTE: This sheet is a summary. It may not cover all possible information. If you have questions about this medicine, talk to your doctor, pharmacist, or health care provider.  2024 Elsevier/Gold Standard (2022-03-28 00:00:00)      To help prevent nausea and vomiting after your treatment, we encourage you to take your nausea medication as directed.  BELOW ARE SYMPTOMS THAT SHOULD BE REPORTED IMMEDIATELY: *FEVER GREATER THAN 100.4 F (38 C) OR HIGHER *CHILLS OR  SWEATING *NAUSEA AND VOMITING THAT IS NOT CONTROLLED WITH YOUR NAUSEA MEDICATION *UNUSUAL SHORTNESS OF BREATH *UNUSUAL BRUISING OR BLEEDING *URINARY PROBLEMS (pain or burning when urinating, or frequent urination) *BOWEL PROBLEMS (unusual diarrhea, constipation, pain near the anus) TENDERNESS IN MOUTH AND THROAT WITH OR WITHOUT PRESENCE OF ULCERS (sore throat, sores in mouth, or a toothache) UNUSUAL RASH, SWELLING OR PAIN  UNUSUAL VAGINAL DISCHARGE OR ITCHING   Items with * indicate a potential emergency and should be followed up as soon as possible or go to the Emergency Department if any problems should occur.  Please show the CHEMOTHERAPY ALERT CARD or IMMUNOTHERAPY ALERT CARD at check-in to the Emergency Department and triage nurse.  Should you have questions after your visit or need to cancel or reschedule your appointment, please contact [facility] [place] - A DEPT OF Eligha Bridegroom [facility] [phone]  and follow the prompts.  Office hours are 8:00 a.m. to 4:30 p.m. Monday - Friday. Please note that voicemails left after 4:00 p.m. may not be returned until the following business day.  We are closed weekends and major holidays. You have access to a nurse at all times for urgent questions. Please call the main number to the clinic [phone] and follow the prompts.  For any non-urgent questions, you may also contact your provider using MyChart. We now offer e-Visits for anyone 74 and older to request care online for non-urgent symptoms. For details visit [email or web address].   Also download the MyChart app! Go to the app store, search "MyChart", open the app, select [place], and log in with your MyChart username and password.

## 2024-03-13 NOTE — Progress Notes (Signed)
Patient presents today for pump d/c. Vital signs are stable. Port a cath site clean, dry, and intact. Port flushed with 10 mls of Normal Saline and 500 Units of Heparin. Needle removed intact. Band aid applied. Patient has no complaints at this time. Discharged from clinic ambulatory and in stable condition. Patient alert and oriented.  

## 2024-03-18 ENCOUNTER — Ambulatory Visit (HOSPITAL_BASED_OUTPATIENT_CLINIC_OR_DEPARTMENT_OTHER)
Admission: RE | Admit: 2024-03-18 | Discharge: 2024-03-18 | Disposition: A | Discharge status: Home / Self Care | Source: Ambulatory Visit | Attending: Hematology | Admitting: Hematology

## 2024-03-18 ENCOUNTER — Ambulatory Visit (HOSPITAL_COMMUNITY)

## 2024-03-18 DIAGNOSIS — C20 Malignant neoplasm of rectum: Secondary | ICD-10-CM | POA: Diagnosis present

## 2024-03-18 MED ORDER — IOHEXOL 300 MG/ML  SOLN
100.0000 mL | Freq: Once | INTRAMUSCULAR | Status: AC | PRN
  Administered 2024-03-18: 100 mL via INTRAVENOUS

## 2024-03-25 ENCOUNTER — Inpatient Hospital Stay: Admitting: Hematology

## 2024-03-25 ENCOUNTER — Inpatient Hospital Stay

## 2024-03-25 ENCOUNTER — Inpatient Hospital Stay (HOSPITAL_BASED_OUTPATIENT_CLINIC_OR_DEPARTMENT_OTHER): Admitting: Hematology

## 2024-03-25 VITALS — BP 107/61 | HR 93 | Temp 98.0°F | Resp 18 | Wt 155.0 lb

## 2024-03-25 VITALS — BP 111/62 | HR 94 | Temp 97.2°F | Resp 18

## 2024-03-25 DIAGNOSIS — Z95828 Presence of other vascular implants and grafts: Secondary | ICD-10-CM | POA: Diagnosis not present

## 2024-03-25 DIAGNOSIS — C20 Malignant neoplasm of rectum: Secondary | ICD-10-CM | POA: Diagnosis not present

## 2024-03-25 DIAGNOSIS — Z5111 Encounter for antineoplastic chemotherapy: Secondary | ICD-10-CM | POA: Diagnosis not present

## 2024-03-25 LAB — CBC WITH DIFFERENTIAL/PLATELET
Abs Immature Granulocytes: 0.01 10*3/uL (ref 0.00–0.07)
Basophils Absolute: 0 10*3/uL (ref 0.0–0.1)
Basophils Relative: 1 %
Eosinophils Absolute: 0.3 10*3/uL (ref 0.0–0.5)
Eosinophils Relative: 7 %
HCT: 37.1 % (ref 36.0–46.0)
Hemoglobin: 12 g/dL (ref 12.0–15.0)
Immature Granulocytes: 0 %
Lymphocytes Relative: 18 %
Lymphs Abs: 0.8 10*3/uL (ref 0.7–4.0)
MCH: 31.2 pg (ref 26.0–34.0)
MCHC: 32.3 g/dL (ref 30.0–36.0)
MCV: 96.4 fL (ref 80.0–100.0)
Monocytes Absolute: 0.3 10*3/uL (ref 0.1–1.0)
Monocytes Relative: 7 %
Neutro Abs: 3 10*3/uL (ref 1.7–7.7)
Neutrophils Relative %: 67 %
Platelets: 296 10*3/uL (ref 150–400)
RBC: 3.85 MIL/uL — ABNORMAL LOW (ref 3.87–5.11)
RDW: 16.5 % — ABNORMAL HIGH (ref 11.5–15.5)
WBC: 4.4 10*3/uL (ref 4.0–10.5)
nRBC: 0 % (ref 0.0–0.2)

## 2024-03-25 LAB — COMPREHENSIVE METABOLIC PANEL WITH GFR
ALT: 13 U/L (ref 0–44)
AST: 15 U/L (ref 15–41)
Albumin: 3.5 g/dL (ref 3.5–5.0)
Alkaline Phosphatase: 75 U/L (ref 38–126)
Anion gap: 11 (ref 5–15)
BUN: 12 mg/dL (ref 6–20)
CO2: 25 mmol/L (ref 22–32)
Calcium: 9.2 mg/dL (ref 8.9–10.3)
Chloride: 102 mmol/L (ref 98–111)
Creatinine, Ser: 0.86 mg/dL (ref 0.44–1.00)
GFR, Estimated: >60 mL/min (ref >60)
Glucose, Bld: 112 mg/dL — ABNORMAL HIGH (ref 70–99)
Potassium: 3.9 mmol/L (ref 3.5–5.1)
Sodium: 138 mmol/L (ref 135–145)
Total Bilirubin: 0.4 mg/dL (ref 0.0–1.2)
Total Protein: 6.7 g/dL (ref 6.5–8.1)

## 2024-03-25 LAB — MAGNESIUM: Magnesium: 2.1 mg/dL (ref 1.7–2.4)

## 2024-03-25 MED ORDER — LEUCOVORIN CALCIUM INJECTION 350 MG
400.0000 mg/m2 | Freq: Once | INTRAMUSCULAR | Status: AC
  Administered 2024-03-25: 736 mg via INTRAVENOUS
  Filled 2024-03-25: qty 36.8

## 2024-03-25 MED ORDER — ALTEPLASE 2 MG IJ SOLR
2.0000 mg | Freq: Once | INTRAMUSCULAR | Status: AC
Start: 2024-03-25 — End: 2024-03-25
  Administered 2024-03-25: 2 mg
  Filled 2024-03-25: qty 2

## 2024-03-25 MED ORDER — SODIUM CHLORIDE 0.9 % IV SOLN
2400.0000 mg/m2 | INTRAVENOUS | Status: DC
  Administered 2024-03-25: 4400 mg via INTRAVENOUS
  Filled 2024-03-25: qty 88

## 2024-03-25 MED ORDER — FLUOROURACIL CHEMO INJECTION 2.5 GM/50ML
400.0000 mg/m2 | Freq: Once | INTRAVENOUS | Status: AC
  Administered 2024-03-25: 750 mg via INTRAVENOUS
  Filled 2024-03-25: qty 15

## 2024-03-25 MED ORDER — DEXTROSE 5 % IV SOLN: INTRAVENOUS | Status: DC

## 2024-03-25 MED ORDER — OXALIPLATIN CHEMO INJECTION 100 MG/20ML
65.0000 mg/m2 | Freq: Once | INTRAVENOUS | Status: AC
  Administered 2024-03-25: 120 mg via INTRAVENOUS
  Filled 2024-03-25: qty 20

## 2024-03-25 MED ORDER — DEXAMETHASONE SODIUM PHOSPHATE 10 MG/ML IJ SOLN
10.0000 mg | Freq: Once | INTRAMUSCULAR | Status: AC
  Administered 2024-03-25: 10 mg via INTRAVENOUS
  Filled 2024-03-25: qty 1

## 2024-03-25 MED ORDER — PALONOSETRON HCL INJECTION 0.25 MG/5ML
0.2500 mg | Freq: Once | INTRAVENOUS | Status: AC
  Administered 2024-03-25: 0.25 mg via INTRAVENOUS
  Filled 2024-03-25: qty 5

## 2024-03-25 NOTE — Progress Notes (Signed)
 [name] [address], [place] [address]    Clinic Day:  03/25/2024  Referring physician: Yevette Hem, FNP  Patient Care Team: Yevette Hem, FNP as PCP - General (Family Medicine) Paulett Boros, MD as Medical Oncologist (Medical Oncology) Gerhard Knuckles, RN as Oncology Nurse Navigator (Medical Oncology)   ASSESSMENT & PLAN:   Assessment: 1. 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  Stools have become bloody/mucousy lately. - She also reported pain in the tailbone region since March 2023.  She also has right-sided lower back pain. - 50 pound weight loss in the last 9 months, part of weight loss was intentional.  She cut back on eating sweets and lost taste to sweets after COVID infection. - CT [facility] with contrast on 03/08/2022: Irregular circumferential masslike wall thickening of sigmoid colon/rectum with adjacent perirectal adenopathy.  Multiple small hypodense lesions in the liver, largest 2.9 cm in the central aspect of the liver, question metastatic disease. - Colonoscopy on 03/21/2022 by Dr. Mordechai April: Fungating infiltrative nearly completely obstructing mass in the rectosigmoid colon, mass was circumferential measuring 4 cm in length. - Pathology: Rectal mass biopsy consistent with invasive moderately differentiated adenocarcinoma.  As there is very scant invasive tumor, MSI studies were deferred. - PET scan (03/23/2022): Hypermetabolic rectal primary long-segment with SUV 14.3.  Left posterior perirectal lymph node 7 mm with SUV 2.9.  Multiple tiny foci of hepatic hypermetabolism. - MRI of the liver (04/01/2022): Multiple small hypovascular rim-enhancing liver lesions, predominantly in the right hepatic lobe measuring up to 1.1 cm.  3.3 cm hypervascular mass in the central liver, most consistent with FNH/hepatic adenoma. - Liver biopsy (04/20/2022): Metastatic moderately  differentiated colonic adenocarcinoma with mucinous features - NGS testing shows K-ras G12 R mutation, PIK3CA exon 21 mutation.  MS-stable.  TMB-low. - Cycle 1 of FOLFOXIRI on 05/29/2022, bevacizumab  added during cycle 2, cycle 13 on 11/15/2022 -MRI pelvis (10/23/2022): Known metastatic rectal cancer with persistent rectal narrowing and signs of involvement of posterior cervical stroma and anterior peritoneal reflection.  There is evidence of residual tumor in the posterior cervix.  Persistent presacral edema not substantially changed. - MRI liver (11/10/2022): 3 benign lesions and previous small rim-enhancing metastatic lesions in the liver are no longer seen. - Chemoradiation therapy with Xeloda  from 12/11/2022 through 01/18/2023. - 04/04/2023: Robotic assisted LAR, diverting ileostomy, TAH with BSO - Pathology: YpT4b, ypN0, YPM1, 0/12 lymph nodes involved, no tumor deposits identified, margins negative, LVI negative, PNI positive, 1.5 cm grade 2 adenocarcinoma in the rectosigmoid colon, no perforation, MMR preserved. - 11/07/2023: Diverting loop ileostomy takedown - CT CAP (12/30/2023): Left hepatic lobe lesion increased 5.2 x 5.1 cm, previously 3.5 x 3 cm.  Posterior right lower lobe lesion measures 1.8 x 1.7, previously 1.6 x 1.4.  Cavitary nodule in the right lung measures 13 x 10 mm, previously 1211 mm.  3 mm nodule in the left upper lobe is now 6 mm.  This was compared to CT from 10/01/2023. - She had 76-month break from chemotherapy due to surgery. - FOLFIRI from 12/10/2023 through 03/11/2024 with progression. - FOLFOX from 03/25/2024   2. Social/family history: - She is separated and is seen today with her daughter.  She works as a Pensions consultant at [employer].  She is current active smoker, 1 pack/day for 27 years.  Denies drinking alcohol. - Paternal aunt had colon cancer.  Maternal cousin  has breast cancer.  Maternal uncle had leukemia and maternal cousin had leukemia.    Plan: Metastatic rectal cancer to  the liver: - She has received 6 cycles of continuous FOLFIRI.  Bevacizumab  was held because of fistula formation. - Labs today: Normal LFTs and creatinine.  CBC is normal.  CEA is 37.5, up from 27 on 02/11/2024. - CT CAP (03/18/2024): There is slight progression of 2 larger metastatic lesions by few millimeters.  No new lesions. - CT findings, and with rising CEA level consistent with progression.  Hence I recommended switching therapy to FOLFOX.  Last time she received oxaliplatin  was in October 2023, discontinued prior to surgery but not from progression.  Hence we can recycle FOLFOX. - We discussed side effects.  She may proceed with cycle 1 today and cycle 2 in 2 weeks.  Will started oxaliplatin  65 mg/m given pre-existing neuropathy in the feet.  RTC 4 weeks for follow-up.   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 - Dr. Andy Bannister has referred her to specialists at [facility] and [facility].  3.  Perirectal pain: - She is taking gabapentin  300 mg and oxycodone  as needed for pain.  She also uses Vaseline.  4.  Peripheral neuropathy: - She has constant tingling in the bottom of her feet and toes.  Will closely monitor.    Orders Placed This Encounter  Procedures   Magnesium    Standing Status:   Future    Expected Date:   03/25/2024    Expiration Date:   03/25/2025   CBC with Differential    Standing Status:   Future    Expected Date:   03/25/2024    Expiration Date:   03/25/2025   Comprehensive metabolic panel    Standing Status:   Future    Expected Date:   03/25/2024    Expiration Date:   03/25/2025   Magnesium    Standing Status:   Future    Expected Date:   04/08/2024    Expiration Date:   04/08/2025   CBC with Differential    Standing Status:   Future    Expected Date:   04/08/2024    Expiration Date:   04/08/2025   Comprehensive metabolic panel    Standing Status:   Future    Expected Date:   04/08/2024    Expiration Date:   04/08/2025   Magnesium     Standing Status:   Future    Expected Date:   04/22/2024    Expiration Date:   04/22/2025   CBC with Differential    Standing Status:   Future    Expected Date:   04/22/2024    Expiration Date:   04/22/2025   Comprehensive metabolic panel    Standing Status:   Future    Expected Date:   04/22/2024    Expiration Date:   04/22/2025   Magnesium    Standing Status:   Future    Expected Date:   05/06/2024    Expiration Date:   05/06/2025   CBC with Differential    Standing Status:   Future    Expected Date:   05/06/2024    Expiration Date:   05/06/2025   Comprehensive metabolic panel    Standing Status:   Future    Expected Date:   05/06/2024    Expiration Date:   05/06/2025   Magnesium    Standing Status:   Future    Expected Date:   05/20/2024  Expiration Date:   05/20/2025   CBC with Differential    Standing Status:   Future    Expected Date:   05/20/2024    Expiration Date:   05/20/2025   Comprehensive metabolic panel    Standing Status:   Future    Expected Date:   05/20/2024    Expiration Date:   05/20/2025   Magnesium    Standing Status:   Future    Expected Date:   06/03/2024    Expiration Date:   06/03/2025   CBC with Differential    Standing Status:   Future    Expected Date:   06/03/2024    Expiration Date:   06/03/2025   Comprehensive metabolic panel    Standing Status:   Future    Expected Date:   06/03/2024    Expiration Date:   06/03/2025      Hurman Maiden R Teague,acting as a scribe for Paulett Boros, MD.,have documented all relevant documentation on the behalf of Paulett Boros, MD,as directed by  Paulett Boros, MD while in the presence of Paulett Boros, MD.  I, Paulett Boros MD, have reviewed the above documentation for accuracy and completeness, and I agree with the above.      Paulett Boros, MD   4/22/202510:38 AM  CHIEF COMPLAINT:   Diagnosis: metastatic rectal cancer to the liver    Cancer Staging  Rectal cancer [facility]) Staging form:  Colon and Rectum, AJCC 8th Edition - Clinical stage from 03/28/2022: Stage IVA (cTX, cN1, cM1a) - Unsigned    Prior Therapy: 1. FOLFOXIRI and bevacizumab , 13 cycles, 05/29/22 - 11/15/22  2. Xeloda  with radiation therapy, 12/11/22 - 01/18/23 3. LAR with diverting ileostomy and TAH w/ BSO, 04/04/23  Current Therapy:  FOLFIRI   HISTORY OF PRESENT ILLNESS:   Oncology History  Rectal cancer (HCC)  03/28/2022 Initial Diagnosis   Rectal cancer (HCC)   05/29/2022 - 07/26/2022 Chemotherapy   Patient is on Treatment Plan : COLORECTAL FOLFOXIRI + Bevacizumab  q14d     05/29/2022 - 11/17/2022 Chemotherapy   Patient is on Treatment Plan : COLORECTAL FOLFOXIRI + Bevacizumab  q14d      Genetic Testing   No pathogenic variants identified on the [employer] CustomNext+RNA panel. The report date is 09/08/2022.  The CustomNext-Cancer+RNAinsight panel offered by [facility] includes sequencing and rearrangement analysis for the following 47 genes:  APC, ATM, AXIN2, BARD1, BMPR1A, BRCA1, BRCA2, BRIP1, CDH1, CDK4, CDKN2A, CHEK2, DICER1, EPCAM, GREM1, HOXB13, MEN1, MLH1, MSH2, MSH3, MSH6, MUTYH, NBN, NF1, NF2, NTHL1, PALB2, PMS2, POLD1, POLE, PTEN, RAD51C, RAD51D, RECQL, RET, SDHA, SDHAF2, SDHB, SDHC, SDHD, SMAD4, SMARCA4, STK11, TP53, TSC1, TSC2, and VHL.  RNA data is routinely analyzed for use in variant interpretation for all genes.   07/16/2023 - 03/13/2024 Chemotherapy   Patient is on Treatment Plan : COLORECTAL FOLFIRI + Bevacizumab  q14d     03/25/2024 -  Chemotherapy   Patient is on Treatment Plan : COLORECTAL FOLFOX q14d        INTERVAL HISTORY:   Terri Mckinney is a 43 y.o. female presenting to clinic today for follow up of metastatic rectal cancer to the liver. She was last seen by me on 02/26/24.  Since her last visit, she underwent CT CAP on 03/18/24 that found: Slight progression of hepatic metastatic disease. Stable pulmonary metastases. Mild hepatomegaly.  Today, she states that she is doing well overall. Her  appetite level is at 100%. Her energy level is at 80%. She has attempted to take gabapentin  TID but notes  it gave her severe diarrhea and now takes it prn. Calandria notes she had 12 episodes of diarrhea on Saturday. She takes oxycodone  nightly. Kirstyn also treats rectal pain by applying petroleum jelly to the area.   She reports constant tingling in the bottom of the feet and the bilateral toes. She denies any pain to the areas.   She has consultation on 04/04/24 with [facility] health for possible fistula surgery. She also has a consultation in June 2025 with [facility] for fistula surgery.   PAST MEDICAL HISTORY:   Past Medical History: Past Medical History:  Diagnosis Date   Cancer [facility])    colorectal cancer   Neuromuscular disorder (HCC)    neuropathy in hands and feet from chemo   Port-A-Cath in place 05/24/2022   upper Rt chest   Tobacco abuse     Surgical History: Past Surgical History:  Procedure Laterality Date   BIOPSY  03/21/2022   Procedure: BIOPSY;  Surgeon: Vinetta Greening, DO;  [facility];  Service: Endoscopy;;   BIOPSY  01/22/2024   Procedure: BIOPSY;  Surgeon: Vinetta Greening, DO;  [facility];  Service: Endoscopy;;   COLONOSCOPY WITH PROPOFOL  N/A 03/21/2022   Procedure: COLONOSCOPY WITH PROPOFOL ;  Surgeon: Vinetta Greening, DO;  [facility];  Service: Endoscopy;  Laterality: N/A;  8:30am   COLONOSCOPY WITH PROPOFOL  N/A 01/22/2024   Procedure: COLONOSCOPY WITH PROPOFOL ;  Surgeon: Vinetta Greening, DO;  [facility]: [facility];  Service: Endoscopy;  Laterality: N/A;  215pm, asa 2   FLEXIBLE SIGMOIDOSCOPY N/A 05/19/2022   Procedure: FLEXIBLE SIGMOIDOSCOPY WITH TATTOO INJECTION;  Surgeon: Joyce Nixon, MD;  Location: [facility];  Service: General;  Laterality: N/A;   ILEOSTOMY N/A 04/04/2023   Procedure: DIVERTING ILEOSTOMY;  Surgeon: Joyce Nixon, MD;  Location: [facility];  Service: General;  Laterality: N/A;   ILEOSTOMY CLOSURE N/A 11/07/2023    Procedure: DIVERTING LOOP ILEOSTOMY TAKEDOWN;  Surgeon: Joyce Nixon, MD;  Location: [facility];  Service: General;  Laterality: N/A;   LAPAROSCOPIC DIVERTED COLOSTOMY N/A 05/19/2022   Procedure: LAPAROSCOPIC DIVERTED OSTOMY;  Surgeon: Joyce Nixon, MD;  Location: [facility];  Service: General;  Laterality: N/A;   PORTACATH PLACEMENT Right 05/19/2022   Procedure: INSERTION PORT-A-CATH;  Surgeon: Joyce Nixon, MD;  Location: [facility];  Service: General;  Laterality: Right;   ROBOTIC ASSISTED TOTAL HYSTERECTOMY WITH BILATERAL SALPINGO OOPHERECTOMY N/A 04/04/2023   Procedure: XI ROBOTIC ASSISTED TOTAL HYSTERECTOMY WITH BILATERAL SALPINGO OOPHORECTOMY;  Surgeon: Suzi Essex, MD;  Location: [facility];  Service: Gynecology;  Laterality: N/A;   XI ROBOTIC ASSISTED LOWER ANTERIOR RESECTION N/A 04/04/2023   Procedure: XI ROBOTIC ASSISTED LOWER ANTERIOR RESECTION;  Surgeon: Joyce Nixon, MD;  Location: [facility];  Service: General;  Laterality: N/A;    Social History: Social History   Socioeconomic History   Marital status: Married    Spouse name: Not on file   Number of children: Not on file   Years of education: Not on file   Highest education level: Not on file  Occupational History   Not on file  Tobacco Use   Smoking status: Every Day    Current packs/day: 0.25    Average packs/day: 0.3 packs/day for 27.0 years (6.8 ttl pk-yrs)    Types: Cigarettes    Passive exposure: Current   Smokeless tobacco: Never  Vaping Use   Vaping status: Never Used  Substance and Sexual Activity   Alcohol use: No   Drug use:  No   Sexual activity: Yes    Birth control/protection: Injection  Other Topics Concern   Not on file  Social History Narrative   Not on file   Social Drivers of Health   Financial Resource Strain: Not on file  Food Insecurity: No Food Insecurity (11/08/2023)   Hunger Vital Sign    Worried About Running Out of Food in the Last Year: Never true    Ran Out of Food in the Last Year:  Never true  Transportation Needs: No Transportation Needs (11/08/2023)   PRAPARE - Administrator, Civil Service (Medical): No    Lack of Transportation (Non-Medical): No  Physical Activity: Not on file  Stress: Not on file  Social Connections: Not on file  Intimate Partner Violence: Not At Risk (11/08/2023)   Humiliation, Afraid, Rape, and Kick questionnaire    Fear of Current or Ex-Partner: No    Emotionally Abused: No    Physically Abused: No    Sexually Abused: No    Family History: Family History  Problem Relation Age of Onset   Heart disease Mother    Diabetes Mother    Hearing loss Father    Heart disease Father    Hyperlipidemia Father    Hypertension Father    Stroke Father    Arthritis Father    Diabetes Father    Learning disabilities Brother    Diabetes Maternal Grandmother    Heart disease Maternal Grandmother    Diabetes Maternal Grandfather    Diabetes Paternal Grandmother    Diabetes Paternal Grandfather    Leukemia Maternal Uncle        d. 58s   Colon cancer Paternal Aunt        dx 71s   Breast cancer Cousin 33   Lung cancer Maternal Aunt     Current Medications:  Current Outpatient Medications:    estradiol  (ESTRACE  VAGINAL) 0.1 MG/GM vaginal cream, 1 week after surgery, begin to use cream nightly at bedtime for 2 weeks then 3 times a week after to assist with healing vagina. Use a fingertip size amount of cream on finger and place slightly past vaginal entrance. Do not use the applicator that comes with cream., Disp: 42.5 g, Rfl: 3   gabapentin  (NEURONTIN ) 300 MG capsule, Take 1 capsule (300 mg total) by mouth 3 (three) times daily., Disp: 90 capsule, Rfl: 3   loperamide  (IMODIUM ) 2 MG capsule, Take 1 capsule (2 mg total) by mouth 4 (four) times daily as needed for diarrhea or loose stools., Disp: 12 capsule, Rfl: 0   Multiple Vitamin (MULTIVITAMIN) tablet, Take 1 tablet by mouth daily., Disp: , Rfl:    oxyCODONE  (ROXICODONE ) 5 MG immediate  release tablet, Take 1 tablet (5 mg total) by mouth every 6 (six) hours as needed for severe pain (pain score 7-10)., Disp: 60 tablet, Rfl: 0 No current facility-administered medications for this visit.  Facility-Administered Medications Ordered in Other Visits:    dexamethasone  (DECADRON ) injection 10 mg, 10 mg, Intravenous, Once, Paulett Boros, MD   dextrose  5 % solution, , Intravenous, Continuous, Paulett Boros, MD, Last Rate: 10 mL/hr at 03/25/24 1031, New Bag at 03/25/24 1031   fluorouracil  (ADRUCIL ) 4,400 mg in sodium chloride  0.9 % 62 mL chemo infusion, 2,400 mg/m2 (Treatment Plan Recorded), Intravenous, 1 day or 1 dose, Elizjah Noblet, MD   fluorouracil  (ADRUCIL ) chemo injection 750 mg, 400 mg/m2 (Treatment Plan Recorded), Intravenous, Once, Paulett Boros, MD   leucovorin  736 mg in dextrose  5 %  250 mL infusion, 400 mg/m2 (Treatment Plan Recorded), Intravenous, Once, Paulett Boros, MD   oxaliplatin  (ELOXATIN ) 120 mg in dextrose  5 % 500 mL chemo infusion, 65 mg/m2 (Treatment Plan Recorded), Intravenous, Once, Kensey Luepke, MD   palonosetron  (ALOXI ) injection 0.25 mg, 0.25 mg, Intravenous, Once, Paulett Boros, MD   Allergies: Allergies  Allergen Reactions   Wellbutrin  [Bupropion ]     insomnia   Tramadol  Rash    Mainly in chest and neck area    REVIEW OF SYSTEMS:   Review of Systems  Constitutional:  Negative for chills, fatigue and fever.  HENT:   Negative for lump/mass, mouth sores, nosebleeds, sore throat and trouble swallowing.   Eyes:  Negative for eye problems.  Respiratory:  Positive for cough. Negative for shortness of breath.   Cardiovascular:  Negative for chest pain, leg swelling and palpitations.  Gastrointestinal:  Positive for rectal pain (6-7/10 severity). Negative for abdominal pain, constipation, diarrhea, nausea and vomiting.  Genitourinary:  Negative for bladder incontinence, difficulty urinating, dysuria,  frequency, hematuria and nocturia.   Musculoskeletal:  Negative for arthralgias, back pain, flank pain, myalgias and neck pain.  Skin:  Negative for itching and rash.  Neurological:  Positive for numbness (and tingling in feet). Negative for dizziness and headaches.  Hematological:  Does not bruise/bleed easily.  Psychiatric/Behavioral:  Negative for depression, sleep disturbance and suicidal ideas. The patient is not nervous/anxious.   All other systems reviewed and are negative.    VITALS:   Blood pressure 107/61, pulse 93, temperature 98 F (36.7 C), temperature source Oral, resp. rate 18, weight 154 lb 15.7 oz (70.3 kg), last menstrual period 04/19/2022, SpO2 99%.  Wt Readings from Last 3 Encounters:  03/25/24 154 lb 15.7 oz (70.3 kg)  03/11/24 154 lb 15.7 oz (70.3 kg)  02/26/24 159 lb 13.3 oz (72.5 kg)    Body mass index is 23.57 kg/m.  Performance status (ECOG): 0 - Asymptomatic  PHYSICAL EXAM:   Physical Exam Vitals and nursing note reviewed. Exam conducted with a chaperone present.  Constitutional:      Appearance: Normal appearance.  Cardiovascular:     Rate and Rhythm: Normal rate and regular rhythm.     Pulses: Normal pulses.     Heart sounds: Normal heart sounds.  Pulmonary:     Effort: Pulmonary effort is normal.     Breath sounds: Normal breath sounds.  Abdominal:     Palpations: Abdomen is soft. There is no hepatomegaly, splenomegaly or mass.     Tenderness: There is no abdominal tenderness.  Musculoskeletal:     Right lower leg: No edema.     Left lower leg: No edema.  Lymphadenopathy:     Cervical: No cervical adenopathy.     Right cervical: No superficial, deep or posterior cervical adenopathy.    Left cervical: No superficial, deep or posterior cervical adenopathy.     Upper Body:     Right upper body: No supraclavicular or axillary adenopathy.     Left upper body: No supraclavicular or axillary adenopathy.  Neurological:     General: No focal  deficit present.     Mental Status: She is alert and oriented to person, place, and time.  Psychiatric:        Mood and Affect: Mood normal.        Behavior: Behavior normal.     LABS:   CBC     Component Value Date/Time   WBC 4.4 03/25/2024 0909   RBC 3.85 (L) 03/25/2024  0909   HGB 12.0 03/25/2024 0909   HGB 12.3 03/03/2022 1618   HCT 37.1 03/25/2024 0909   HCT 35.4 03/03/2022 1618   PLT 296 03/25/2024 0909   PLT 351 03/03/2022 1618   MCV 96.4 03/25/2024 0909   MCV 91 03/03/2022 1618   MCH 31.2 03/25/2024 0909   MCHC 32.3 03/25/2024 0909   RDW 16.5 (H) 03/25/2024 0909   RDW 12.1 03/03/2022 1618   LYMPHSABS 0.8 03/25/2024 0909   LYMPHSABS 3.7 (H) 03/03/2022 1618   MONOABS 0.3 03/25/2024 0909   EOSABS 0.3 03/25/2024 0909   EOSABS 0.9 (H) 03/03/2022 1618   BASOSABS 0.0 03/25/2024 0909   BASOSABS 0.1 03/03/2022 1618    CMP      Component Value Date/Time   NA 138 03/25/2024 0909   NA 144 03/03/2022 1618   K 3.9 03/25/2024 0909   CL 102 03/25/2024 0909   CO2 25 03/25/2024 0909   GLUCOSE 112 (H) 03/25/2024 0909   BUN 12 03/25/2024 0909   BUN 10 03/03/2022 1618   CREATININE 0.86 03/25/2024 0909   CALCIUM  9.2 03/25/2024 0909   PROT 6.7 03/25/2024 0909   PROT 5.8 (L) 03/03/2022 1618   ALBUMIN 3.5 03/25/2024 0909   ALBUMIN 4.0 03/03/2022 1618   AST 15 03/25/2024 0909   ALT 13 03/25/2024 0909   ALKPHOS 75 03/25/2024 0909   BILITOT 0.4 03/25/2024 0909   BILITOT <0.2 03/03/2022 1618   GFRNONAA >60 03/25/2024 0909   GFRAA 97 07/16/2020 1604     Lab Results  Component Value Date   CEA1 37.5 (H) 03/11/2024   /  CEA  Date Value Ref Range Status  03/11/2024 37.5 (H) 0.0 - 4.7 ng/mL Final    Comment:    (NOTE)                             Nonsmokers          <3.9                             Smokers             <5.6 [employer] Electrochemiluminescence Immunoassay (ECLIA) Values obtained with different assay methods or kits cannot be used  interchangeably.  Results cannot be interpreted as absolute evidence of the presence or absence of malignant disease. Performed At: [facility] [address] [place], [place] [id number] Pearlean Botts MD WU:[id number]    No results found for: "PSA1" No results found for: "[phone]" No results found for: "CAN125"  No results found for: "TOTALPROTELP", "ALBUMINELP", "A1GS", "A2GS", "BETS", "BETA2SER", "GAMS", "MSPIKE", "SPEI" No results found for: "TIBC", "FERRITIN", "IRONPCTSAT" No results found for: "LDH"   STUDIES:   CT CHEST ABDOMEN PELVIS W CONTRAST Result Date: 03/24/2024 CLINICAL DATA:  Colorectal cancer, evaluate for metastatic disease. * Tracking Code: BO * EXAM: CT CHEST, ABDOMEN, AND PELVIS WITH CONTRAST TECHNIQUE: Multidetector CT imaging of the chest, abdomen and pelvis was performed following the standard protocol during bolus administration of intravenous contrast. RADIATION DOSE REDUCTION: This exam was performed according to the departmental dose-optimization program which includes automated exposure control, adjustment of the mA and/or kV according to patient size and/or use of iterative reconstruction technique. CONTRAST:  OMNIPAQUE  IOHEXOL  300 MG/ML  SOLN COMPARISON:  CT pelvis 01/09/2024 and CT chest abdomen pelvis 12/30/2023. FINDINGS: CT CHEST FINDINGS Cardiovascular: Right IJ Port-A-Cath terminates in the high  SVC and appears to contain a low-attenuation fibrin sheath along the tip. Heart is at the upper limits of normal in size. No pericardial effusion. Mediastinum/Nodes: No pathologically enlarged mediastinal, hilar or axillary lymph nodes. Esophagus is grossly unremarkable. Lungs/Pleura: Cavitary nodule in the inferior right upper lobe measures 9 x 12 mm (3/81), unchanged. Volume loss in the medial right upper, right middle and left upper lobes. Anterior segment left upper lobe nodule measures 5 mm (3/56), stable. No new pulmonary nodules. No pleural fluid.  Airway is unremarkable. Musculoskeletal: Degenerative changes in the spine. No worrisome lytic or sclerotic lesions. CT ABDOMEN PELVIS FINDINGS Hepatobiliary: Low-attenuation metastases are again seen in both lobes of the liver and appear minimally larger, measuring up to 5.0 x 5.7 cm in the left hepatic lobe (2/50), previously 4.9 x 5.1 cm. Liver is enlarged, 19.3 cm. Gallbladder is unremarkable. No biliary ductal dilatation. Pancreas: Negative Spleen: Negative. Adrenals/Urinary Tract: Adrenal glands and kidneys are unremarkable. Ureters are decompressed. Bladder is grossly unremarkable. Stomach/Bowel: Stomach is unremarkable. Ileostomy reversal. Small-bowel and appendix are otherwise unremarkable. Moderate stool burden. Lower anterior resection with reanastomosis. Associated presacral soft tissue thickening. Vascular/Lymphatic: Vascular structures are unremarkable. No pathologically enlarged lymph nodes. Reproductive: Hysterectomy.  No adnexal mass. Other: No free fluid.  Mesenteries and peritoneum are unremarkable. Musculoskeletal: Minimal degenerative change in the spine. No worrisome lytic or sclerotic lesions. IMPRESSION: 1. Slight progression of hepatic metastatic disease. 2. Stable pulmonary metastases. 3. Mild hepatomegaly. Electronically Signed   By: Shearon Denis M.D.   On: 03/24/2024 10:39

## 2024-03-25 NOTE — Patient Instructions (Signed)
 [facility] [place] [facility]  Discharge Instructions: Thank you for choosing [facility] to provide your oncology and hematology care.  If you have a lab appointment with the Cancer Center - please note that after April 8th, 2024, all labs will be drawn in the cancer center.  You do not have to check in or register with the main entrance as you have in the past but will complete your check-in in the cancer center.  Wear comfortable clothing and clothing appropriate for easy access to any Portacath or PICC line.   We strive to give you quality time with your provider. You may need to reschedule your appointment if you arrive late (15 or more minutes).  Arriving late affects you and other patients whose appointments are after yours.  Also, if you miss three or more appointments without notifying the office, you may be dismissed from the clinic at the provider's discretion.      For prescription refill requests, have your pharmacy contact our office and allow 72 hours for refills to be completed.    Today you received the following chemotherapy and/or immunotherapy agents Oxaliplatin/Leucovorin/5FU.  Oxaliplatin Injection What is this medication? OXALIPLATIN (ox AL i PLA tin) treats colorectal cancer. It works by slowing down the growth of cancer cells. This medicine may be used for other purposes; ask your health care provider or pharmacist if you have questions. COMMON BRAND NAME(S): Eloxatin What should I tell my care team before I take this medication? They need to know if you have any of these conditions: Heart disease History of irregular heartbeat or rhythm Liver disease Low blood cell levels (white cells, red cells, and platelets) Lung or breathing disease, such as asthma Take medications that treat or prevent blood clots Tingling of the fingers, toes, or other nerve disorder An unusual or allergic reaction to oxaliplatin, other  medications, foods, dyes, or preservatives If you or your partner are pregnant or trying to get pregnant Breast-feeding How should I use this medication? This medication is injected into a vein. It is given by your care team in a hospital or clinic setting. Talk to your care team about the use of this medication in children. Special care may be needed. Overdosage: If you think you have taken too much of this medicine contact a poison control center or emergency room at once. NOTE: This medicine is only for you. Do not share this medicine with others. What if I miss a dose? Keep appointments for follow-up doses. It is important not to miss a dose. Call your care team if you are unable to keep an appointment. What may interact with this medication? Do not take this medication with any of the following: Cisapride Dronedarone Pimozide Thioridazine This medication may also interact with the following: Aspirin and aspirin-like medications Certain medications that treat or prevent blood clots, such as warfarin, apixaban, dabigatran, and rivaroxaban Cisplatin Cyclosporine Diuretics Medications for infection, such as acyclovir, adefovir, amphotericin B, bacitracin, cidofovir, foscarnet, ganciclovir, gentamicin, pentamidine, vancomycin NSAIDs, medications for pain and inflammation, such as ibuprofen or naproxen Other medications that cause heart rhythm changes Pamidronate Zoledronic acid This list may not describe all possible interactions. Give your health care provider a list of all the medicines, herbs, non-prescription drugs, or dietary supplements you use. Also tell them if you smoke, drink alcohol, or use illegal drugs. Some items may interact with your medicine. What should I watch for while using  this medication? Your condition will be monitored carefully while you are receiving this medication. You may need blood work while taking this medication. This medication may make you feel  generally unwell. This is not uncommon as chemotherapy can affect healthy cells as well as cancer cells. Report any side effects. Continue your course of treatment even though you feel ill unless your care team tells you to stop. This medication may increase your risk of getting an infection. Call your care team for advice if you get a fever, chills, sore throat, or other symptoms of a cold or flu. Do not treat yourself. Try to avoid being around people who are sick. Avoid taking medications that contain aspirin, acetaminophen, ibuprofen, naproxen, or ketoprofen unless instructed by your care team. These medications may hide a fever. Be careful brushing or flossing your teeth or using a toothpick because you may get an infection or bleed more easily. If you have any dental work done, tell your dentist you are receiving this medication. This medication can make you more sensitive to cold. Do not drink cold drinks or use ice. Cover exposed skin before coming in contact with cold temperatures or cold objects. When out in cold weather wear warm clothing and cover your mouth and nose to warm the air that goes into your lungs. Tell your care team if you get sensitive to the cold. Talk to your care team if you or your partner are pregnant or think either of you might be pregnant. This medication can cause serious birth defects if taken during pregnancy and for 9 months after the last dose. A negative pregnancy test is required before starting this medication. A reliable form of contraception is recommended while taking this medication and for 9 months after the last dose. Talk to your care team about effective forms of contraception. Do not father a child while taking this medication and for 6 months after the last dose. Use a condom while having sex during this time period. Do not breastfeed while taking this medication and for 3 months after the last dose. This medication may cause infertility. Talk to your care  team if you are concerned about your fertility. What side effects may I notice from receiving this medication? Side effects that you should report to your care team as soon as possible: Allergic reactions--skin rash, itching, hives, swelling of the face, lips, tongue, or throat Bleeding--bloody or black, tar-like stools, vomiting blood or Trichelle Lehan material that looks like coffee grounds, red or dark Odis Wickey urine, small red or purple spots on skin, unusual bruising or bleeding Dry cough, shortness of breath or trouble breathing Heart rhythm changes--fast or irregular heartbeat, dizziness, feeling faint or lightheaded, chest pain, trouble breathing Infection--fever, chills, cough, sore throat, wounds that don't heal, pain or trouble when passing urine, general feeling of discomfort or being unwell Liver injury--right upper belly pain, loss of appetite, nausea, light-colored stool, dark yellow or Dennice Tindol urine, yellowing skin or eyes, unusual weakness or fatigue Low red blood cell level--unusual weakness or fatigue, dizziness, headache, trouble breathing Muscle injury--unusual weakness or fatigue, muscle pain, dark yellow or Rilen Shukla urine, decrease in amount of urine Pain, tingling, or numbness in the hands or feet Sudden and severe headache, confusion, change in vision, seizures, which may be signs of posterior reversible encephalopathy syndrome (PRES) Unusual bruising or bleeding Side effects that usually do not require medical attention (report to your care team if they continue or are bothersome): Diarrhea Nausea Pain, redness, or swelling with  sores inside the mouth or throat Unusual weakness or fatigue Vomiting This list may not describe all possible side effects. Call your doctor for medical advice about side effects. You may report side effects to FDA at 1-800-FDA-1088. Where should I keep my medication? This medication is given in a hospital or clinic. It will not be stored at home. NOTE: This  sheet is a summary. It may not cover all possible information. If you have questions about this medicine, talk to your doctor, pharmacist, or health care provider.  2024 Elsevier/Gold Standard (2023-11-02 00:00:00)   Leucovorin Injection What is this medication? LEUCOVORIN (loo koe VOR in) prevents side effects from certain medications, such as methotrexate. It works by increasing folate levels. This helps protect healthy cells in your body. It may also be used to treat anemia caused by low levels of folate. It can also be used with fluorouracil, a type of chemotherapy, to treat colorectal cancer. It works by increasing the effects of fluorouracil in the body. This medicine may be used for other purposes; ask your health care provider or pharmacist if you have questions. What should I tell my care team before I take this medication? They need to know if you have any of these conditions: Anemia from low levels of vitamin B12 in the blood An unusual or allergic reaction to leucovorin, folic acid, other medications, foods, dyes, or preservatives Pregnant or trying to get pregnant Breastfeeding How should I use this medication? This medication is injected into a vein or a muscle. It is given by your care team in a hospital or clinic setting. Talk to your care team about the use of this medication in children. Special care may be needed. Overdosage: If you think you have taken too much of this medicine contact a poison control center or emergency room at once. NOTE: This medicine is only for you. Do not share this medicine with others. What if I miss a dose? Keep appointments for follow-up doses. It is important not to miss your dose. Call your care team if you are unable to keep an appointment. What may interact with this medication? Capecitabine Fluorouracil Phenobarbital Phenytoin Primidone Trimethoprim;sulfamethoxazole This list may not describe all possible interactions. Give your health  care provider a list of all the medicines, herbs, non-prescription drugs, or dietary supplements you use. Also tell them if you smoke, drink alcohol, or use illegal drugs. Some items may interact with your medicine. What should I watch for while using this medication? Your condition will be monitored carefully while you are receiving this medication. This medication may increase the side effects of 5-fluorouracil. Tell your care team if you have diarrhea or mouth sores that do not get better or that get worse. What side effects may I notice from receiving this medication? Side effects that you should report to your care team as soon as possible: Allergic reactions--skin rash, itching, hives, swelling of the face, lips, tongue, or throat This list may not describe all possible side effects. Call your doctor for medical advice about side effects. You may report side effects to FDA at 1-800-FDA-1088. Where should I keep my medication? This medication is given in a hospital or clinic. It will not be stored at home. NOTE: This sheet is a summary. It may not cover all possible information. If you have questions about this medicine, talk to your doctor, pharmacist, or health care provider.  2024 Elsevier/Gold Standard (2022-04-25 00:00:00)   Fluorouracil Injection What is this medication? FLUOROURACIL (flure  oh YOOR a sil) treats some types of cancer. It works by slowing down the growth of cancer cells. This medicine may be used for other purposes; ask your health care provider or pharmacist if you have questions. COMMON BRAND NAME(S): Adrucil What should I tell my care team before I take this medication? They need to know if you have any of these conditions: Blood disorders Dihydropyrimidine dehydrogenase (DPD) deficiency Infection, such as chickenpox, cold sores, herpes Kidney disease Liver disease Poor nutrition Recent or ongoing radiation therapy An unusual or allergic reaction to  fluorouracil, other medications, foods, dyes, or preservatives If you or your partner are pregnant or trying to get pregnant Breast-feeding How should I use this medication? This medication is injected into a vein. It is administered by your care team in a hospital or clinic setting. Talk to your care team about the use of this medication in children. Special care may be needed. Overdosage: If you think you have taken too much of this medicine contact a poison control center or emergency room at once. NOTE: This medicine is only for you. Do not share this medicine with others. What if I miss a dose? Keep appointments for follow-up doses. It is important not to miss your dose. Call your care team if you are unable to keep an appointment. What may interact with this medication? Do not take this medication with any of the following: Live virus vaccines This medication may also interact with the following: Medications that treat or prevent blood clots, such as warfarin, enoxaparin, dalteparin This list may not describe all possible interactions. Give your health care provider a list of all the medicines, herbs, non-prescription drugs, or dietary supplements you use. Also tell them if you smoke, drink alcohol, or use illegal drugs. Some items may interact with your medicine. What should I watch for while using this medication? Your condition will be monitored carefully while you are receiving this medication. This medication may make you feel generally unwell. This is not uncommon as chemotherapy can affect healthy cells as well as cancer cells. Report any side effects. Continue your course of treatment even though you feel ill unless your care team tells you to stop. In some cases, you may be given additional medications to help with side effects. Follow all directions for their use. This medication may increase your risk of getting an infection. Call your care team for advice if you get a fever,  chills, sore throat, or other symptoms of a cold or flu. Do not treat yourself. Try to avoid being around people who are sick. This medication may increase your risk to bruise or bleed. Call your care team if you notice any unusual bleeding. Be careful brushing or flossing your teeth or using a toothpick because you may get an infection or bleed more easily. If you have any dental work done, tell your dentist you are receiving this medication. Avoid taking medications that contain aspirin, acetaminophen, ibuprofen, naproxen, or ketoprofen unless instructed by your care team. These medications may hide a fever. Do not treat diarrhea with over the counter products. Contact your care team if you have diarrhea that lasts more than 2 days or if it is severe and watery. This medication can make you more sensitive to the sun. Keep out of the sun. If you cannot avoid being in the sun, wear protective clothing and sunscreen. Do not use sun lamps, tanning beds, or tanning booths. Talk to your care team if you or your  partner wish to become pregnant or think you might be pregnant. This medication can cause serious birth defects if taken during pregnancy and for 3 months after the last dose. A reliable form of contraception is recommended while taking this medication and for 3 months after the last dose. Talk to your care team about effective forms of contraception. Do not father a child while taking this medication and for 3 months after the last dose. Use a condom while having sex during this time period. Do not breastfeed while taking this medication. This medication may cause infertility. Talk to your care team if you are concerned about your fertility. What side effects may I notice from receiving this medication? Side effects that you should report to your care team as soon as possible: Allergic reactions--skin rash, itching, hives, swelling of the face, lips, tongue, or throat Heart attack--pain or tightness  in the chest, shoulders, arms, or jaw, nausea, shortness of breath, cold or clammy skin, feeling faint or lightheaded Heart failure--shortness of breath, swelling of the ankles, feet, or hands, sudden weight gain, unusual weakness or fatigue Heart rhythm changes--fast or irregular heartbeat, dizziness, feeling faint or lightheaded, chest pain, trouble breathing High ammonia level--unusual weakness or fatigue, confusion, loss of appetite, nausea, vomiting, seizures Infection--fever, chills, cough, sore throat, wounds that don't heal, pain or trouble when passing urine, general feeling of discomfort or being unwell Low red blood cell level--unusual weakness or fatigue, dizziness, headache, trouble breathing Pain, tingling, or numbness in the hands or feet, muscle weakness, change in vision, confusion or trouble speaking, loss of balance or coordination, trouble walking, seizures Redness, swelling, and blistering of the skin over hands and feet Severe or prolonged diarrhea Unusual bruising or bleeding Side effects that usually do not require medical attention (report to your care team if they continue or are bothersome): Dry skin Headache Increased tears Nausea Pain, redness, or swelling with sores inside the mouth or throat Sensitivity to light Vomiting This list may not describe all possible side effects. Call your doctor for medical advice about side effects. You may report side effects to FDA at 1-800-FDA-1088. Where should I keep my medication? This medication is given in a hospital or clinic. It will not be stored at home. NOTE: This sheet is a summary. It may not cover all possible information. If you have questions about this medicine, talk to your doctor, pharmacist, or health care provider.  2024 Elsevier/Gold Standard (2022-03-28 00:00:00)       To help prevent nausea and vomiting after your treatment, we encourage you to take your nausea medication as directed.  BELOW ARE  SYMPTOMS THAT SHOULD BE REPORTED IMMEDIATELY: *FEVER GREATER THAN 100.4 F (38 C) OR HIGHER *CHILLS OR SWEATING *NAUSEA AND VOMITING THAT IS NOT CONTROLLED WITH YOUR NAUSEA MEDICATION *UNUSUAL SHORTNESS OF BREATH *UNUSUAL BRUISING OR BLEEDING *URINARY PROBLEMS (pain or burning when urinating, or frequent urination) *BOWEL PROBLEMS (unusual diarrhea, constipation, pain near the anus) TENDERNESS IN MOUTH AND THROAT WITH OR WITHOUT PRESENCE OF ULCERS (sore throat, sores in mouth, or a toothache) UNUSUAL RASH, SWELLING OR PAIN  UNUSUAL VAGINAL DISCHARGE OR ITCHING   Items with * indicate a potential emergency and should be followed up as soon as possible or go to the Emergency Department if any problems should occur.  Please show the CHEMOTHERAPY ALERT CARD or IMMUNOTHERAPY ALERT CARD at check-in to the Emergency Department and triage nurse.  Should you have questions after your visit or need to cancel or reschedule your  appointment, please contact [facility] [place] - A DEPT OF Eligh[facility] [phone]  and follow the prompts.  Office hours are 8:00 a.m. to 4:30 p.m. Monday - Friday. Please note that voicemails left after 4:00 p.m. may not be returned until the following business day.  We are closed weekends and major holidays. You have access to a nurse at all times for urgent questions. Please call the main number to the clinic [phone] and follow the prompts.  For any non-urgent questions, you may also contact your provider using MyChart. We now offer e-Visits for anyone 23 and older to request care online for non-urgent symptoms. For details visit [email or web address].   Also download the MyChart app! Go to the app store, search "MyChart", open the app, select [facility], and log in with your MyChart username and password.

## 2024-03-25 NOTE — Patient Instructions (Addendum)
 [facility] at [facility] Discharge Instructions   You were seen and examined today by Dr. Cheree Cords.  He reviewed the results of your lab work which are normal/stable.   He reviewed the results of your CT scan. It is showing that the two spots in the liver are growing, as well as your CEA continuing to go up. We will need to change your treatment.  We will change your treatment back to FOLFOX. This is the first chemotherapy regimen you got when you started treatment.    Return as scheduled.    Thank you for choosing [facility] at [facility] to provide your oncology and hematology care.  To afford each patient quality time with our provider, please arrive at least 15 minutes before your scheduled appointment time.   If you have a lab appointment with the Cancer Center please come in thru the Main Entrance and check in at the main information desk.  You need to re-schedule your appointment should you arrive 10 or more minutes late.  We strive to give you quality time with our providers, and arriving late affects you and other patients whose appointments are after yours.  Also, if you no show three or more times for appointments you may be dismissed from the clinic at the providers discretion.     Again, thank you for choosing [facility].  Our hope is that these requests will decrease the amount of time that you wait before being seen by our physicians.       _____________________________________________________________  Should you have questions after your visit to [facility], please contact our office at [phone] and follow the prompts.  Our office hours are 8:00 a.m. and 4:30 p.m. Monday - Friday.  Please note that voicemails left after 4:00 p.m. may not be returned until the following business day.  We are closed weekends and major holidays.  You do have access to a nurse 24-7, just call the main number to the  clinic [phone] and do not press any options, hold on the line and a nurse will answer the phone.    For prescription refill requests, have your pharmacy contact our office and allow 72 hours.    Due to Covid, you will need to wear a mask upon entering the hospital. If you do not have a mask, a mask will be given to you at the Main Entrance upon arrival. For doctor visits, patients may have 1 support person age 55 or older with them. For treatment visits, patients can not have anyone with them due to social distancing guidelines and our immunocompromised population.

## 2024-03-25 NOTE — Progress Notes (Signed)
 Patient has been examined by Dr. Ellin Saba. Vital signs and labs have been reviewed by MD - ANC, Creatinine, LFTs, hemoglobin, and platelets are within treatment parameters per M.D. - pt may proceed with treatment.  Primary RN and pharmacy notified.

## 2024-03-25 NOTE — Progress Notes (Signed)
 Pharmacist Chemotherapy Monitoring - Initial Assessment    Anticipated start date: 03/25/24   The following has been reviewed per standard work regarding the patient's treatment regimen: The patient's diagnosis, treatment plan and drug doses, and organ/hematologic function Lab orders and baseline tests specific to treatment regimen  The treatment plan start date, drug sequencing, and pre-medications Prior authorization status  Patient's documented medication list, including drug-drug interaction screen and prescriptions for anti-emetics and supportive care specific to the treatment regimen The drug concentrations, fluid compatibility, administration routes, and timing of the medications to be used The patient's access for treatment and lifetime cumulative dose history, if applicable  The patient's medication allergies and previous infusion related reactions, if applicable   Changes made to treatment plan:  N/A  Follow up needed:  N/A   Artie Laster, [facility], 03/25/2024  10:35 AM

## 2024-03-25 NOTE — Progress Notes (Signed)
 Patient presents today for chemotherapy infusion. Patient is in satisfactory condition with no new complaints voiced.  Vital signs are stable.  Labs reviewed by Dr. Ellin Saba during the office visit and all labs are within treatment parameters.  We will proceed with treatment per MD orders.   Patient tolerated treatment well with no complaints voiced.  Home infusion 5FU pump connected.   Patient left ambulatory in stable condition.  Vital signs stable at discharge.  Follow up as scheduled.

## 2024-03-25 NOTE — Progress Notes (Signed)
 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

## 2024-03-25 NOTE — Progress Notes (Signed)
 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

## 2024-03-26 ENCOUNTER — Other Ambulatory Visit: Payer: Self-pay

## 2024-03-26 LAB — CEA: CEA: 39.9 ng/mL — ABNORMAL HIGH (ref 0.0–4.7)

## 2024-03-27 ENCOUNTER — Inpatient Hospital Stay

## 2024-03-27 VITALS — BP 113/74 | HR 81 | Temp 97.9°F | Resp 16

## 2024-03-27 DIAGNOSIS — Z95828 Presence of other vascular implants and grafts: Secondary | ICD-10-CM

## 2024-03-27 DIAGNOSIS — C20 Malignant neoplasm of rectum: Secondary | ICD-10-CM

## 2024-03-27 DIAGNOSIS — Z5111 Encounter for antineoplastic chemotherapy: Secondary | ICD-10-CM | POA: Diagnosis not present

## 2024-03-27 MED ORDER — SODIUM CHLORIDE 0.9% FLUSH
10.0000 mL | INTRAVENOUS | Status: DC | PRN
  Administered 2024-03-27: 10 mL

## 2024-03-27 MED ORDER — HEPARIN SOD (PORK) LOCK FLUSH 100 UNIT/ML IV SOLN
500.0000 [IU] | Freq: Once | INTRAVENOUS | Status: AC | PRN
Start: 2024-03-27 — End: 2024-03-27
  Administered 2024-03-27: 500 [IU]

## 2024-03-27 NOTE — Progress Notes (Signed)
 Terri Mckinney presents to have home infusion pump d/c'd and for port-a-cath deaccess with flush.  Portacath located right chest wall accessed with  H 20 needle.  Good blood return present. Portacath flushed with NS and 500U/29ml Heparin , and needle removed intact.  Procedure tolerated well and without incident.

## 2024-03-27 NOTE — Patient Instructions (Signed)
 [facility] [place] - A DEPT OF [facility]  Discharge Instructions: Thank you for choosing [facility] to provide your oncology and hematology care.  If you have a lab appointment with the Cancer Center - please note that after April 8th, 2024, all labs will be drawn in the cancer center.  You do not have to check in or register with the main entrance as you have in the past but will complete your check-in in the cancer center.  Wear comfortable clothing and clothing appropriate for easy access to any Portacath or PICC line.   We strive to give you quality time with your provider. You may need to reschedule your appointment if you arrive late (15 or more minutes).  Arriving late affects you and other patients whose appointments are after yours.  Also, if you miss three or more appointments without notifying the office, you may be dismissed from the clinic at the provider's discretion.      For prescription refill requests, have your pharmacy contact our office and allow 72 hours for refills to be completed.    Today you had your ambulatory pump disconnected per orders.    To help prevent nausea and vomiting after your treatment, we encourage you to take your nausea medication as directed.  BELOW ARE SYMPTOMS THAT SHOULD BE REPORTED IMMEDIATELY: *FEVER GREATER THAN 100.4 F (38 C) OR HIGHER *CHILLS OR SWEATING *NAUSEA AND VOMITING THAT IS NOT CONTROLLED WITH YOUR NAUSEA MEDICATION *UNUSUAL SHORTNESS OF BREATH *UNUSUAL BRUISING OR BLEEDING *URINARY PROBLEMS (pain or burning when urinating, or frequent urination) *BOWEL PROBLEMS (unusual diarrhea, constipation, pain near the anus) TENDERNESS IN MOUTH AND THROAT WITH OR WITHOUT PRESENCE OF ULCERS (sore throat, sores in mouth, or a toothache) UNUSUAL RASH, SWELLING OR PAIN  UNUSUAL VAGINAL DISCHARGE OR ITCHING   Items with * indicate a potential emergency and should be followed up as soon as possible or go  to the Emergency Department if any problems should occur.  Please show the CHEMOTHERAPY ALERT CARD or IMMUNOTHERAPY ALERT CARD at check-in to the Emergency Department and triage nurse.  Should you have questions after your visit or need to cancel or reschedule your appointment, please contact [facility] [place] - A DEPT OF [facility] [phone]  and follow the prompts.  Office hours are 8:00 a.m. to 4:30 p.m. Monday - Friday. Please note that voicemails left after 4:00 p.m. may not be returned until the following business day.  We are closed weekends and major holidays. You have access to a nurse at all times for urgent questions. Please call the main number to the clinic [phone] and follow the prompts.  For any non-urgent questions, you may also contact your provider using MyChart. We now offer e-Visits for anyone 14 and older to request care online for non-urgent symptoms. For details visit [email or web address].   Also download the MyChart app! Go to the app store, search "MyChart", open the app, select , and log in with your MyChart username and password.

## 2024-04-08 ENCOUNTER — Inpatient Hospital Stay: Attending: Hematology

## 2024-04-08 ENCOUNTER — Inpatient Hospital Stay

## 2024-04-08 VITALS — BP 94/78 | HR 93 | Temp 99.0°F | Resp 19

## 2024-04-08 DIAGNOSIS — G629 Polyneuropathy, unspecified: Secondary | ICD-10-CM | POA: Insufficient documentation

## 2024-04-08 DIAGNOSIS — D72819 Decreased white blood cell count, unspecified: Secondary | ICD-10-CM | POA: Insufficient documentation

## 2024-04-08 DIAGNOSIS — N823 Fistula of vagina to large intestine: Secondary | ICD-10-CM | POA: Insufficient documentation

## 2024-04-08 DIAGNOSIS — C20 Malignant neoplasm of rectum: Secondary | ICD-10-CM | POA: Diagnosis not present

## 2024-04-08 DIAGNOSIS — R911 Solitary pulmonary nodule: Secondary | ICD-10-CM | POA: Diagnosis not present

## 2024-04-08 DIAGNOSIS — Z806 Family history of leukemia: Secondary | ICD-10-CM | POA: Diagnosis not present

## 2024-04-08 DIAGNOSIS — K6289 Other specified diseases of anus and rectum: Secondary | ICD-10-CM | POA: Diagnosis not present

## 2024-04-08 DIAGNOSIS — Z95828 Presence of other vascular implants and grafts: Secondary | ICD-10-CM

## 2024-04-08 DIAGNOSIS — Z800 Family history of malignant neoplasm of digestive organs: Secondary | ICD-10-CM | POA: Diagnosis not present

## 2024-04-08 DIAGNOSIS — Z803 Family history of malignant neoplasm of breast: Secondary | ICD-10-CM | POA: Diagnosis not present

## 2024-04-08 DIAGNOSIS — Z5111 Encounter for antineoplastic chemotherapy: Secondary | ICD-10-CM | POA: Insufficient documentation

## 2024-04-08 DIAGNOSIS — F17210 Nicotine dependence, cigarettes, uncomplicated: Secondary | ICD-10-CM | POA: Diagnosis not present

## 2024-04-08 DIAGNOSIS — Z801 Family history of malignant neoplasm of trachea, bronchus and lung: Secondary | ICD-10-CM | POA: Insufficient documentation

## 2024-04-08 DIAGNOSIS — C787 Secondary malignant neoplasm of liver and intrahepatic bile duct: Secondary | ICD-10-CM | POA: Diagnosis not present

## 2024-04-08 LAB — CBC WITH DIFFERENTIAL/PLATELET
Abs Immature Granulocytes: 0.01 10*3/uL (ref 0.00–0.07)
Basophils Absolute: 0.1 10*3/uL (ref 0.0–0.1)
Basophils Relative: 1 %
Eosinophils Absolute: 0.4 10*3/uL (ref 0.0–0.5)
Eosinophils Relative: 7 %
HCT: 35 % — ABNORMAL LOW (ref 36.0–46.0)
Hemoglobin: 11.5 g/dL — ABNORMAL LOW (ref 12.0–15.0)
Immature Granulocytes: 0 %
Lymphocytes Relative: 16 %
Lymphs Abs: 0.8 10*3/uL (ref 0.7–4.0)
MCH: 32.1 pg (ref 26.0–34.0)
MCHC: 32.9 g/dL (ref 30.0–36.0)
MCV: 97.8 fL (ref 80.0–100.0)
Monocytes Absolute: 0.4 10*3/uL (ref 0.1–1.0)
Monocytes Relative: 9 %
Neutro Abs: 3.2 10*3/uL (ref 1.7–7.7)
Neutrophils Relative %: 67 %
Platelets: 211 10*3/uL (ref 150–400)
RBC: 3.58 MIL/uL — ABNORMAL LOW (ref 3.87–5.11)
RDW: 16.7 % — ABNORMAL HIGH (ref 11.5–15.5)
WBC: 4.9 10*3/uL (ref 4.0–10.5)
nRBC: 0 % (ref 0.0–0.2)

## 2024-04-08 LAB — COMPREHENSIVE METABOLIC PANEL WITH GFR
ALT: 13 U/L (ref 0–44)
AST: 19 U/L (ref 15–41)
Albumin: 3.5 g/dL (ref 3.5–5.0)
Alkaline Phosphatase: 79 U/L (ref 38–126)
Anion gap: 11 (ref 5–15)
BUN: 12 mg/dL (ref 6–20)
CO2: 22 mmol/L (ref 22–32)
Calcium: 9 mg/dL (ref 8.9–10.3)
Chloride: 101 mmol/L (ref 98–111)
Creatinine, Ser: 0.87 mg/dL (ref 0.44–1.00)
GFR, Estimated: >60 mL/min (ref >60)
Glucose, Bld: 144 mg/dL — ABNORMAL HIGH (ref 70–99)
Potassium: 3.9 mmol/L (ref 3.5–5.1)
Sodium: 134 mmol/L — ABNORMAL LOW (ref 135–145)
Total Bilirubin: <0.2 mg/dL (ref 0.0–1.2)
Total Protein: 6.5 g/dL (ref 6.5–8.1)

## 2024-04-08 LAB — MAGNESIUM: Magnesium: 2.1 mg/dL (ref 1.7–2.4)

## 2024-04-08 MED ORDER — PALONOSETRON HCL INJECTION 0.25 MG/5ML
0.2500 mg | Freq: Once | INTRAVENOUS | Status: AC
  Administered 2024-04-08: 0.25 mg via INTRAVENOUS
  Filled 2024-04-08: qty 5

## 2024-04-08 MED ORDER — DEXTROSE 5 % IV SOLN: INTRAVENOUS | Status: DC

## 2024-04-08 MED ORDER — OXALIPLATIN CHEMO INJECTION 100 MG/20ML
65.0000 mg/m2 | Freq: Once | INTRAVENOUS | Status: AC
  Administered 2024-04-08: 120 mg via INTRAVENOUS
  Filled 2024-04-08: qty 20

## 2024-04-08 MED ORDER — SODIUM CHLORIDE 0.9 % IV SOLN
2400.0000 mg/m2 | INTRAVENOUS | Status: DC
  Administered 2024-04-08: 4400 mg via INTRAVENOUS
  Filled 2024-04-08: qty 88

## 2024-04-08 MED ORDER — DEXAMETHASONE SODIUM PHOSPHATE 10 MG/ML IJ SOLN
10.0000 mg | Freq: Once | INTRAMUSCULAR | Status: AC
  Administered 2024-04-08: 10 mg via INTRAVENOUS
  Filled 2024-04-08: qty 1

## 2024-04-08 MED ORDER — LEUCOVORIN CALCIUM INJECTION 350 MG
400.0000 mg/m2 | Freq: Once | INTRAVENOUS | Status: AC
  Administered 2024-04-08: 736 mg via INTRAVENOUS
  Filled 2024-04-08: qty 36.8

## 2024-04-08 MED ORDER — FLUOROURACIL CHEMO INJECTION 2.5 GM/50ML
400.0000 mg/m2 | Freq: Once | INTRAVENOUS | Status: AC
Start: 2024-04-08 — End: 2024-04-08
  Administered 2024-04-08: 750 mg via INTRAVENOUS
  Filled 2024-04-08: qty 15

## 2024-04-08 NOTE — Patient Instructions (Signed)
 [facility] [place] - A DEPT OF [facility]  Discharge Instructions: Thank you for choosing [facility] to provide your oncology and hematology care.  If you have a lab appointment with the Cancer Center - please note that after April 8th, 2024, all labs will be drawn in the cancer center.  You do not have to check in or register with the main entrance as you have in the past but will complete your check-in in the cancer center.  Wear comfortable clothing and clothing appropriate for easy access to any Portacath or PICC line.   We strive to give you quality time with your provider. You may need to reschedule your appointment if you arrive late (15 or more minutes).  Arriving late affects you and other patients whose appointments are after yours.  Also, if you miss three or more appointments without notifying the office, you may be dismissed from the clinic at the provider's discretion.      For prescription refill requests, have your pharmacy contact our office and allow 72 hours for refills to be completed.    Today you received the following chemotherapy and/or immunotherapy agents Folfox   To help prevent nausea and vomiting after your treatment, we encourage you to take your nausea medication as directed.  Oxaliplatin  Injection What is this medication? OXALIPLATIN  (ox AL i PLA tin) treats colorectal cancer. It works by slowing down the growth of cancer cells. This medicine may be used for other purposes; ask your health care provider or pharmacist if you have questions. COMMON BRAND NAME(S): Eloxatin  What should I tell my care team before I take this medication? They need to know if you have any of these conditions: Heart disease History of irregular heartbeat or rhythm Liver disease Low blood cell levels (white cells, red cells, and platelets) Lung or breathing disease, such as asthma Take medications that treat or prevent blood clots Tingling of  the fingers, toes, or other nerve disorder An unusual or allergic reaction to oxaliplatin , other medications, foods, dyes, or preservatives If you or your partner are pregnant or trying to get pregnant Breast-feeding How should I use this medication? This medication is injected into a vein. It is given by your care team in a hospital or clinic setting. Talk to your care team about the use of this medication in children. Special care may be needed. Overdosage: If you think you have taken too much of this medicine contact a poison control center or emergency room at once. NOTE: This medicine is only for you. Do not share this medicine with others. What if I miss a dose? Keep appointments for follow-up doses. It is important not to miss a dose. Call your care team if you are unable to keep an appointment. What may interact with this medication? Do not take this medication with any of the following: Cisapride Dronedarone Pimozide Thioridazine This medication may also interact with the following: Aspirin and aspirin-like medications Certain medications that treat or prevent blood clots, such as warfarin, apixaban , dabigatran, and rivaroxaban Cisplatin Cyclosporine Diuretics Medications for infection, such as acyclovir, adefovir, amphotericin B, bacitracin, cidofovir, foscarnet, ganciclovir, gentamicin, pentamidine, vancomycin NSAIDs, medications for pain and inflammation, such as ibuprofen or naproxen Other medications that cause heart rhythm changes Pamidronate Zoledronic acid This list may not describe all possible interactions. Give your health care provider a list of all the medicines, herbs, non-prescription drugs, or dietary supplements you use. Also tell them if you  smoke, drink alcohol, or use illegal drugs. Some items may interact with your medicine. What should I watch for while using this medication? Your condition will be monitored carefully while you are receiving this  medication. You may need blood work while taking this medication. This medication may make you feel generally unwell. This is not uncommon as chemotherapy can affect healthy cells as well as cancer cells. Report any side effects. Continue your course of treatment even though you feel ill unless your care team tells you to stop. This medication may increase your risk of getting an infection. Call your care team for advice if you get a fever, chills, sore throat, or other symptoms of a cold or flu. Do not treat yourself. Try to avoid being around people who are sick. Avoid taking medications that contain aspirin, acetaminophen , ibuprofen, naproxen, or ketoprofen unless instructed by your care team. These medications may hide a fever. Be careful brushing or flossing your teeth or using a toothpick because you may get an infection or bleed more easily. If you have any dental work done, tell your dentist you are receiving this medication. This medication can make you more sensitive to cold. Do not drink cold drinks or use ice. Cover exposed skin before coming in contact with cold temperatures or cold objects. When out in cold weather wear warm clothing and cover your mouth and nose to warm the air that goes into your lungs. Tell your care team if you get sensitive to the cold. Talk to your care team if you or your partner are pregnant or think either of you might be pregnant. This medication can cause serious birth defects if taken during pregnancy and for 9 months after the last dose. A negative pregnancy test is required before starting this medication. A reliable form of contraception is recommended while taking this medication and for 9 months after the last dose. Talk to your care team about effective forms of contraception. Do not father a child while taking this medication and for 6 months after the last dose. Use a condom while having sex during this time period. Do not breastfeed while taking this  medication and for 3 months after the last dose. This medication may cause infertility. Talk to your care team if you are concerned about your fertility. What side effects may I notice from receiving this medication? Side effects that you should report to your care team as soon as possible: Allergic reactions--skin rash, itching, hives, swelling of the face, lips, tongue, or throat Bleeding--bloody or black, tar-like stools, vomiting blood or brown material that looks like coffee grounds, red or dark brown urine, small red or purple spots on skin, unusual bruising or bleeding Dry cough, shortness of breath or trouble breathing Heart rhythm changes--fast or irregular heartbeat, dizziness, feeling faint or lightheaded, chest pain, trouble breathing Infection--fever, chills, cough, sore throat, wounds that don't heal, pain or trouble when passing urine, general feeling of discomfort or being unwell Liver injury--right upper belly pain, loss of appetite, nausea, light-colored stool, dark yellow or brown urine, yellowing skin or eyes, unusual weakness or fatigue Low red blood cell level--unusual weakness or fatigue, dizziness, headache, trouble breathing Muscle injury--unusual weakness or fatigue, muscle pain, dark yellow or brown urine, decrease in amount of urine Pain, tingling, or numbness in the hands or feet Sudden and severe headache, confusion, change in vision, seizures, which may be signs of posterior reversible encephalopathy syndrome (PRES) Unusual bruising or bleeding Side effects that usually do not  require medical attention (report to your care team if they continue or are bothersome): Diarrhea Nausea Pain, redness, or swelling with sores inside the mouth or throat Unusual weakness or fatigue Vomiting This list may not describe all possible side effects. Call your doctor for medical advice about side effects. You may report side effects to FDA at 1-800-FDA-1088. Where should I keep my  medication? This medication is given in a hospital or clinic. It will not be stored at home. NOTE: This sheet is a summary. It may not cover all possible information. If you have questions about this medicine, talk to your doctor, pharmacist, or health care provider.  2024 Elsevier/Gold Standard (2023-11-02 00:00:00)  BELOW ARE SYMPTOMS THAT SHOULD BE REPORTED IMMEDIATELY: *FEVER GREATER THAN 100.4 F (38 C) OR HIGHER *CHILLS OR SWEATING *NAUSEA AND VOMITING THAT IS NOT CONTROLLED WITH YOUR NAUSEA MEDICATION *UNUSUAL SHORTNESS OF BREATH *UNUSUAL BRUISING OR BLEEDING *URINARY PROBLEMS (pain or burning when urinating, or frequent urination) *BOWEL PROBLEMS (unusual diarrhea, constipation, pain near the anus) TENDERNESS IN MOUTH AND THROAT WITH OR WITHOUT PRESENCE OF ULCERS (sore throat, sores in mouth, or a toothache) UNUSUAL RASH, SWELLING OR PAIN  UNUSUAL VAGINAL DISCHARGE OR ITCHING   Items with * indicate a potential emergency and should be followed up as soon as possible or go to the Emergency Department if any problems should occur.  Please show the CHEMOTHERAPY ALERT CARD or IMMUNOTHERAPY ALERT CARD at check-in to the Emergency Department and triage nurse.  Should you have questions after your visit or need to cancel or reschedule your appointment, please contact [facility] [place] - A DEPT OF [facility] [phone]  and follow the prompts.  Office hours are 8:00 a.m. to 4:30 p.m. Monday - Friday. Please note that voicemails left after 4:00 p.m. may not be returned until the following business day.  We are closed weekends and major holidays. You have access to a nurse at all times for urgent questions. Please call the main number to the clinic [phone] and follow the prompts.  For any non-urgent questions, you may also contact your provider using MyChart. We now offer e-Visits for anyone 40 and older to request care online for non-urgent symptoms. For  details visit [email or web address].   Also download the MyChart app! Go to the app store, search "MyChart", open the app, select [place], and log in with your MyChart username and password.

## 2024-04-08 NOTE — Progress Notes (Signed)
 Patient presents today Folfox for infusion.  Patient is in satisfactory condition with no new complaints voiced.  Vital signs are stable.  Labs reviewed and all labs are within treatment parameters.  We will proceed with treatment per MD orders.    Treatment given today per MD orders. Tolerated infusion without adverse affects. Vital signs stable. No complaints at this time. Discharged from clinic ambulatory in stable condition. Alert and oriented x 3. F/U with [facility] as scheduled. 5FU ambulatory pump infusing with no alarms beeping.

## 2024-04-10 ENCOUNTER — Inpatient Hospital Stay

## 2024-04-10 VITALS — BP 109/73 | HR 95 | Temp 99.4°F | Resp 18

## 2024-04-10 DIAGNOSIS — C20 Malignant neoplasm of rectum: Secondary | ICD-10-CM

## 2024-04-10 DIAGNOSIS — Z95828 Presence of other vascular implants and grafts: Secondary | ICD-10-CM

## 2024-04-10 DIAGNOSIS — Z5111 Encounter for antineoplastic chemotherapy: Secondary | ICD-10-CM | POA: Diagnosis not present

## 2024-04-10 MED ORDER — HEPARIN SOD (PORK) LOCK FLUSH 100 UNIT/ML IV SOLN
500.0000 [IU] | Freq: Once | INTRAVENOUS | Status: AC | PRN
  Administered 2024-04-10: 500 [IU]

## 2024-04-10 MED ORDER — SODIUM CHLORIDE 0.9% FLUSH
10.0000 mL | INTRAVENOUS | Status: DC | PRN
  Administered 2024-04-10: 10 mL

## 2024-04-10 NOTE — Patient Instructions (Signed)
 [facility] [place] - A DEPT OF [facility]  Discharge Instructions: Thank you for choosing [facility] to provide your oncology and hematology care.  If you have a lab appointment with the Cancer Center - please note that after April 8th, 2024, all labs will be drawn in the cancer center.  You do not have to check in or register with the main entrance as you have in the past but will complete your check-in in the cancer center.  Wear comfortable clothing and clothing appropriate for easy access to any Portacath or PICC line.   We strive to give you quality time with your provider. You may need to reschedule your appointment if you arrive late (15 or more minutes).  Arriving late affects you and other patients whose appointments are after yours.  Also, if you miss three or more appointments without notifying the office, you may be dismissed from the clinic at the provider's discretion.      For prescription refill requests, have your pharmacy contact our office and allow 72 hours for refills to be completed.    Today you had your ambulatory pump disconnected per protocol.    To help prevent nausea and vomiting after your treatment, we encourage you to take your nausea medication as directed.  BELOW ARE SYMPTOMS THAT SHOULD BE REPORTED IMMEDIATELY: *FEVER GREATER THAN 100.4 F (38 C) OR HIGHER *CHILLS OR SWEATING *NAUSEA AND VOMITING THAT IS NOT CONTROLLED WITH YOUR NAUSEA MEDICATION *UNUSUAL SHORTNESS OF BREATH *UNUSUAL BRUISING OR BLEEDING *URINARY PROBLEMS (pain or burning when urinating, or frequent urination) *BOWEL PROBLEMS (unusual diarrhea, constipation, pain near the anus) TENDERNESS IN MOUTH AND THROAT WITH OR WITHOUT PRESENCE OF ULCERS (sore throat, sores in mouth, or a toothache) UNUSUAL RASH, SWELLING OR PAIN  UNUSUAL VAGINAL DISCHARGE OR ITCHING   Items with * indicate a potential emergency and should be followed up as soon as possible or  go to the Emergency Department if any problems should occur.  Please show the CHEMOTHERAPY ALERT CARD or IMMUNOTHERAPY ALERT CARD at check-in to the Emergency Department and triage nurse.  Should you have questions after your visit or need to cancel or reschedule your appointment, please contact [facility] [place] - A DEPT OF [facility] [phone]  and follow the prompts.  Office hours are 8:00 a.m. to 4:30 p.m. Monday - Friday. Please note that voicemails left after 4:00 p.m. may not be returned until the following business day.  We are closed weekends and major holidays. You have access to a nurse at all times for urgent questions. Please call the main number to the clinic [phone] and follow the prompts.  For any non-urgent questions, you may also contact your provider using MyChart. We now offer e-Visits for anyone 45 and older to request care online for non-urgent symptoms. For details visit [email or web address].   Also download the MyChart app! Go to the app store, search "MyChart", open the app, select , and log in with your MyChart username and password.

## 2024-04-10 NOTE — Progress Notes (Signed)
 Terri Mckinney presents to have home infusion pump d/c'd and for port-a-cath deaccess with flush.  Portacath located right chest wall accessed with  H 20 needle.  No blood return and no resistance met.  Portacath flushed with NS and 500U/78ml Heparin , and needle removed intact.  Procedure tolerated well and without incident.

## 2024-04-21 NOTE — Progress Notes (Signed)
 [facility] [address] [place] [address]    Clinic Day:  04/22/2024  Referring physician: Yevette Hem, FNP  Patient Care Team: Terri Hem, FNP as PCP - General (Family Medicine) Terri Boros, MD as Medical Oncologist (Medical Oncology) Terri Knuckles, RN as Oncology Nurse Navigator (Medical Oncology)   ASSESSMENT & PLAN:   Assessment: 1. 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  Stools have become bloody/mucousy lately. - She also reported pain in the tailbone region since March 2023.  She also has right-sided lower back pain. - 50 pound weight loss in the last 9 months, part of weight loss was intentional.  She cut back on eating sweets and lost taste to sweets after COVID infection. - CT [facility] with contrast on 03/08/2022: Irregular circumferential masslike wall thickening of sigmoid colon/rectum with adjacent perirectal adenopathy.  Multiple small hypodense lesions in the liver, largest 2.9 cm in the central aspect of the liver, question metastatic disease. - Colonoscopy on 03/21/2022 by Dr. Mordechai Mckinney: Fungating infiltrative nearly completely obstructing mass in the rectosigmoid colon, mass was circumferential measuring 4 cm in length. - Pathology: Rectal mass biopsy consistent with invasive moderately differentiated adenocarcinoma.  As there is very scant invasive tumor, MSI studies were deferred. - PET scan (03/23/2022): Hypermetabolic rectal primary long-segment with SUV 14.3.  Left posterior perirectal lymph node 7 mm with SUV 2.9.  Multiple tiny foci of hepatic hypermetabolism. - MRI of the liver (04/01/2022): Multiple small hypovascular rim-enhancing liver lesions, predominantly in the right hepatic lobe measuring up to 1.1 cm.  3.3 cm hypervascular mass in the central liver, most consistent with FNH/hepatic adenoma. - Liver biopsy (04/20/2022): Metastatic moderately  differentiated colonic adenocarcinoma with mucinous features - NGS testing shows K-ras G12 R mutation, PIK3CA exon 21 mutation.  MS-stable.  TMB-low. - Cycle 1 of FOLFOXIRI on 05/29/2022, bevacizumab  added during cycle 2, cycle 13 on 11/15/2022 -MRI pelvis (10/23/2022): Known metastatic rectal cancer with persistent rectal narrowing and signs of involvement of posterior cervical stroma and anterior peritoneal reflection.  There is evidence of residual tumor in the posterior cervix.  Persistent presacral edema not substantially changed. - MRI liver (11/10/2022): 3 benign lesions and previous small rim-enhancing metastatic lesions in the liver are no longer seen. - Chemoradiation therapy with Xeloda  from 12/11/2022 through 01/18/2023. - 04/04/2023: Robotic assisted LAR, diverting ileostomy, TAH with BSO - Pathology: YpT4b, ypN0, YPM1, 0/12 lymph nodes involved, no tumor deposits identified, margins negative, LVI negative, PNI positive, 1.5 cm grade 2 adenocarcinoma in the rectosigmoid colon, no perforation, MMR preserved. - 11/07/2023: Diverting loop ileostomy takedown - CT CAP (12/30/2023): Left hepatic lobe lesion increased 5.2 x 5.1 cm, previously 3.5 x 3 cm.  Posterior right lower lobe lesion measures 1.8 x 1.7, previously 1.6 x 1.4.  Cavitary nodule in the right lung measures 13 x 10 mm, previously 1211 mm.  3 mm nodule in the left upper lobe is now 6 mm.  This was compared to CT from 10/01/2023. - She had 85-month break from chemotherapy due to surgery. - FOLFIRI from 12/10/2023 through 03/11/2024 with progression. - FOLFOX from 03/25/2024   2. Social/family history: - She is separated and is seen today with her daughter.  She works as a Pensions consultant at [employer].  She is current active smoker, 1 pack/day for 27 years.  Denies drinking alcohol. - Paternal aunt had colon cancer.  Maternal cousin  has breast cancer.  Maternal uncle had leukemia and maternal cousin had leukemia.    Plan: Metastatic rectal cancer to  the liver: - Bevacizumab  on hold due to fistula formation. - CT CAP (03/18/2024): Slight progression of 2 larger metastatic lesions in the liver by few millimeters.  No new lesions. - FOLFOX started on 03/25/2024.  Oxaliplatin  dose reduced due to pre-existing neuropathy in the feet. - She had cold sensitivity lasting up to 3 days. - Labs today: Normal LFTs and creatinine.  CBC grossly normal with mild leukopenia.  CEA is 39.9. - She may proceed with FOLFOX today and in 2 weeks.  RTC 4 weeks for follow-up.  She has an appointment to see [facility] surgeon in the first week of June.  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 - Dr. Andy Mckinney has referred her to a specialist at [facility].  3.  Perirectal pain: -She stopped taking gabapentin  as it is causing diarrhea.  Continue oxycodone  10 mg once daily as needed for pain.  4.  Peripheral neuropathy: - Constant tingling in the bottom of the feet and toes is stable since the start of oxaliplatin  on 03/25/2024.    No orders of the defined types were placed in this encounter.     Terri Mckinney,acting as a Neurosurgeon for Terri Boros, MD.,have documented all relevant documentation on the behalf of Terri Boros, MD,as directed by  Terri Boros, MD while in the presence of Terri Boros, MD.  I, Terri Boros MD, have reviewed the above documentation for accuracy and completeness, and I agree with the above.     Terri Boros, MD   5/20/202510:47 AM  CHIEF COMPLAINT:   Diagnosis: metastatic rectal cancer to the liver    Cancer Staging  Rectal cancer [facility]) Staging form: Colon and Rectum, AJCC 8th Edition - Clinical stage from 03/28/2022: Stage IVA (cTX, cN1, cM1a) - Unsigned    Prior Therapy: 1. FOLFOXIRI and bevacizumab , 13 cycles, 05/29/22 - 11/15/22  2. Xeloda  with radiation therapy, 12/11/22 - 01/18/23 3. LAR with diverting ileostomy and TAH w/ BSO, 04/04/23  Current  Therapy:  FOLFIRI   HISTORY OF PRESENT ILLNESS:   Oncology History  Rectal cancer (HCC)  03/28/2022 Initial Diagnosis   Rectal cancer (HCC)   05/29/2022 - 07/26/2022 Chemotherapy   Patient is on Treatment Plan : COLORECTAL FOLFOXIRI + Bevacizumab  q14d     05/29/2022 - 11/17/2022 Chemotherapy   Patient is on Treatment Plan : COLORECTAL FOLFOXIRI + Bevacizumab  q14d      Genetic Testing   No pathogenic variants identified on the [facility] CustomNext+RNA panel. The report date is 09/08/2022.  The CustomNext-Cancer+RNAinsight panel offered by [facility] includes sequencing and rearrangement analysis for the following 47 genes:  APC, ATM, AXIN2, BARD1, BMPR1A, BRCA1, BRCA2, BRIP1, CDH1, CDK4, CDKN2A, CHEK2, DICER1, EPCAM, GREM1, HOXB13, MEN1, MLH1, MSH2, MSH3, MSH6, MUTYH, NBN, NF1, NF2, NTHL1, PALB2, PMS2, POLD1, POLE, PTEN, RAD51C, RAD51D, RECQL, RET, SDHA, SDHAF2, SDHB, SDHC, SDHD, SMAD4, SMARCA4, STK11, TP53, TSC1, TSC2, and VHL.  RNA data is routinely analyzed for use in variant interpretation for all genes.   07/16/2023 - 03/13/2024 Chemotherapy   Patient is on Treatment Plan : COLORECTAL FOLFIRI + Bevacizumab  q14d     03/25/2024 -  Chemotherapy   Patient is on Treatment Plan : COLORECTAL FOLFOX q14d        INTERVAL HISTORY:   Saul is a 43 y.o. female presenting to clinic today for follow up of metastatic rectal cancer to  the liver. She was last seen by me on 03/25/24.  Today, she states that she is doing well overall. Her appetite level is at 100%. Her energy level is at 70%.   PAST MEDICAL HISTORY:   Past Medical History: Past Medical History:  Diagnosis Date   Cancer [facility])    colorectal cancer   Neuromuscular disorder (HCC)    neuropathy in hands and feet from chemo   Port-A-Cath in place 05/24/2022   upper Rt chest   Tobacco abuse     Surgical History: Past Surgical History:  Procedure Laterality Date   BIOPSY  03/21/2022   Procedure: BIOPSY;  Surgeon: Vinetta Greening, DO;  [facility];  Service: Endoscopy;;   BIOPSY  01/22/2024   Procedure: BIOPSY;  Surgeon: Vinetta Greening, DO;  [facility];  Service: Endoscopy;;   COLONOSCOPY WITH PROPOFOL  N/A 03/21/2022   Procedure: COLONOSCOPY WITH PROPOFOL ;  Surgeon: Vinetta Greening, DO;  [facility];  Service: Endoscopy;  Laterality: N/A;  8:30am   COLONOSCOPY WITH PROPOFOL  N/A 01/22/2024   Procedure: COLONOSCOPY WITH PROPOFOL ;  Surgeon: Vinetta Greening, DO;  [facility];  Service: Endoscopy;  Laterality: N/A;  215pm, asa 2   FLEXIBLE SIGMOIDOSCOPY N/A 05/19/2022   Procedure: FLEXIBLE SIGMOIDOSCOPY WITH TATTOO INJECTION;  Surgeon: Joyce Nixon, MD;  Location: [facility];  Service: General;  Laterality: N/A;   ILEOSTOMY N/A 04/04/2023   Procedure: DIVERTING ILEOSTOMY;  Surgeon: Joyce Nixon, MD;  Location: [facility];  Service: General;  Laterality: N/A;   ILEOSTOMY CLOSURE N/A 11/07/2023   Procedure: DIVERTING LOOP ILEOSTOMY TAKEDOWN;  Surgeon: Joyce Nixon, MD;  Location: [facility];  Service: General;  Laterality: N/A;   LAPAROSCOPIC DIVERTED COLOSTOMY N/A 05/19/2022   Procedure: LAPAROSCOPIC DIVERTED OSTOMY;  Surgeon: Joyce Nixon, MD;  Location: [facility];  Service: General;  Laterality: N/A;   PORTACATH PLACEMENT Right 05/19/2022   Procedure: INSERTION PORT-A-CATH;  Surgeon: Joyce Nixon, MD;  Location: [facility];  Service: General;  Laterality: Right;   ROBOTIC ASSISTED TOTAL HYSTERECTOMY WITH BILATERAL SALPINGO OOPHERECTOMY N/A 04/04/2023   Procedure: XI ROBOTIC ASSISTED TOTAL HYSTERECTOMY WITH BILATERAL SALPINGO OOPHORECTOMY;  Surgeon: Suzi Essex, MD;  Location: [facility];  Service: Gynecology;  Laterality: N/A;   XI ROBOTIC ASSISTED LOWER ANTERIOR RESECTION N/A 04/04/2023   Procedure: XI ROBOTIC ASSISTED LOWER ANTERIOR RESECTION;  Surgeon: Joyce Nixon, MD;  Location: [facility];  Service: General;  Laterality: N/A;    Social History: Social History    Socioeconomic History   Marital status: Married    Spouse name: Not on file   Number of children: Not on file   Years of education: Not on file   Highest education level: Not on file  Occupational History   Not on file  Tobacco Use   Smoking status: Every Day    Current packs/day: 0.25    Average packs/day: 0.3 packs/day for 27.0 years (6.8 ttl pk-yrs)    Types: Cigarettes    Passive exposure: Current   Smokeless tobacco: Never  Vaping Use   Vaping status: Never Used  Substance and Sexual Activity   Alcohol use: No   Drug use: No   Sexual activity: Yes    Birth control/protection: Injection  Other Topics Concern   Not on file  Social History Narrative   Not on file   Social Drivers of Health   Financial Resource Strain: Not on file  Food Insecurity: No Food Insecurity (11/08/2023)  Hunger Vital Sign    Worried About Running Out of Food in the Last Year: Never true    Ran Out of Food in the Last Year: Never true  Transportation Needs: No Transportation Needs (11/08/2023)   PRAPARE - Administrator, Civil Service (Medical): No    Lack of Transportation (Non-Medical): No  Physical Activity: Not on file  Stress: Not on file  Social Connections: Not on file  Intimate Partner Violence: Not At Risk (11/08/2023)   Humiliation, Afraid, Rape, and Kick questionnaire    Fear of Current or Ex-Partner: No    Emotionally Abused: No    Physically Abused: No    Sexually Abused: No    Family History: Family History  Problem Relation Age of Onset   Heart disease Mother    Diabetes Mother    Hearing loss Father    Heart disease Father    Hyperlipidemia Father    Hypertension Father    Stroke Father    Arthritis Father    Diabetes Father    Learning disabilities Brother    Diabetes Maternal Grandmother    Heart disease Maternal Grandmother    Diabetes Maternal Grandfather    Diabetes Paternal Grandmother    Diabetes Paternal Grandfather    Leukemia Maternal  Uncle        d. 25s   Colon cancer Paternal Aunt        dx 85s   Breast cancer Cousin 33   Lung cancer Maternal Aunt     Current Medications:  Current Outpatient Medications:    estradiol  (ESTRACE  VAGINAL) 0.1 MG/GM vaginal cream, 1 week after surgery, begin to use cream nightly at bedtime for 2 weeks then 3 times a week after to assist with healing vagina. Use a fingertip size amount of cream on finger and place slightly past vaginal entrance. Do not use the applicator that comes with cream., Disp: 42.5 g, Rfl: 3   gabapentin  (NEURONTIN ) 300 MG capsule, Take 1 capsule (300 mg total) by mouth 3 (three) times daily., Disp: 90 capsule, Rfl: 3   loperamide  (IMODIUM ) 2 MG capsule, Take 1 capsule (2 mg total) by mouth 4 (four) times daily as needed for diarrhea or loose stools., Disp: 12 capsule, Rfl: 0   Multiple Vitamin (MULTIVITAMIN) tablet, Take 1 tablet by mouth daily., Disp: , Rfl:    nystatin ointment (MYCOSTATIN), Apply topically daily., Disp: , Rfl:    oxyCODONE  (ROXICODONE ) 5 MG immediate release tablet, Take 1 tablet (5 mg total) by mouth every 6 (six) hours as needed for severe pain (pain score 7-10)., Disp: 60 tablet, Rfl: 0   Allergies: Allergies  Allergen Reactions   Wellbutrin  [Bupropion ]     insomnia   Tramadol  Rash    Mainly in chest and neck area    REVIEW OF SYSTEMS:   Review of Systems  Constitutional:  Negative for chills, fatigue and fever.  HENT:   Negative for lump/mass, mouth sores, nosebleeds, sore throat and trouble swallowing.   Eyes:  Negative for eye problems.  Respiratory:  Negative for cough and shortness of breath.   Cardiovascular:  Negative for chest pain, leg swelling and palpitations.  Gastrointestinal:  Negative for abdominal pain, constipation, diarrhea, nausea and vomiting.  Genitourinary:  Negative for bladder incontinence, difficulty urinating, dysuria, frequency, hematuria and nocturia.   Musculoskeletal:  Negative for arthralgias, back  pain, flank pain, myalgias and neck pain.  Skin:  Negative for itching and rash.  Neurological:  Positive for numbness.  Negative for dizziness and headaches.  Hematological:  Does not bruise/bleed easily.  Psychiatric/Behavioral:  Negative for depression, sleep disturbance and suicidal ideas. The patient is not nervous/anxious.   All other systems reviewed and are negative.    VITALS:   Last menstrual period 04/19/2022.  Wt Readings from Last 3 Encounters:  04/22/24 156 lb 9.6 oz (71 kg)  04/08/24 156 lb 4.9 oz (70.9 kg)  03/25/24 154 lb 15.7 oz (70.3 kg)    There is no height or weight on file to calculate BMI.  Performance status (ECOG): 0 - Asymptomatic  PHYSICAL EXAM:   Physical Exam Vitals and nursing note reviewed. Exam conducted with a chaperone present.  Constitutional:      Appearance: Normal appearance.  Cardiovascular:     Rate and Rhythm: Normal rate and regular rhythm.     Pulses: Normal pulses.     Heart sounds: Normal heart sounds.  Pulmonary:     Effort: Pulmonary effort is normal.     Breath sounds: Normal breath sounds.  Abdominal:     Palpations: Abdomen is soft. There is no hepatomegaly, splenomegaly or mass.     Tenderness: There is no abdominal tenderness.  Musculoskeletal:     Right lower leg: No edema.     Left lower leg: No edema.  Lymphadenopathy:     Cervical: No cervical adenopathy.     Right cervical: No superficial, deep or posterior cervical adenopathy.    Left cervical: No superficial, deep or posterior cervical adenopathy.     Upper Body:     Right upper body: No supraclavicular or axillary adenopathy.     Left upper body: No supraclavicular or axillary adenopathy.  Neurological:     General: No focal deficit present.     Mental Status: She is alert and oriented to person, place, and time.  Psychiatric:        Mood and Affect: Mood normal.        Behavior: Behavior normal.     LABS:   CBC     Component Value Date/Time   WBC  3.3 (L) 04/22/2024 0917   RBC 3.70 (L) 04/22/2024 0917   HGB 11.6 (L) 04/22/2024 0917   HGB 12.3 03/03/2022 1618   HCT 36.0 04/22/2024 0917   HCT 35.4 03/03/2022 1618   PLT 226 04/22/2024 0917   PLT 351 03/03/2022 1618   MCV 97.3 04/22/2024 0917   MCV 91 03/03/2022 1618   MCH 31.4 04/22/2024 0917   MCHC 32.2 04/22/2024 0917   RDW 17.1 (H) 04/22/2024 0917   RDW 12.1 03/03/2022 1618   LYMPHSABS 1.0 04/22/2024 0917   LYMPHSABS 3.7 (H) 03/03/2022 1618   MONOABS 0.3 04/22/2024 0917   EOSABS 0.2 04/22/2024 0917   EOSABS 0.9 (H) 03/03/2022 1618   BASOSABS 0.0 04/22/2024 0917   BASOSABS 0.1 03/03/2022 1618    CMP      Component Value Date/Time   NA 137 04/22/2024 0917   NA 144 03/03/2022 1618   K 3.6 04/22/2024 0917   CL 103 04/22/2024 0917   CO2 24 04/22/2024 0917   GLUCOSE 110 (H) 04/22/2024 0917   BUN 11 04/22/2024 0917   BUN 10 03/03/2022 1618   CREATININE 0.74 04/22/2024 0917   CALCIUM  9.2 04/22/2024 0917   PROT 6.8 04/22/2024 0917   PROT 5.8 (L) 03/03/2022 1618   ALBUMIN 3.5 04/22/2024 0917   ALBUMIN 4.0 03/03/2022 1618   AST 17 04/22/2024 0917   ALT 15 04/22/2024 0917  ALKPHOS 77 04/22/2024 0917   BILITOT 0.6 04/22/2024 0917   BILITOT <0.2 03/03/2022 1618   GFRNONAA >60 04/22/2024 0917   GFRAA 97 07/16/2020 1604     Lab Results  Component Value Date   CEA1 39.9 (H) 03/25/2024   /  CEA  Date Value Ref Range Status  03/25/2024 39.9 (H) 0.0 - 4.7 ng/mL Final    Comment:    (NOTE)                             Nonsmokers          <3.9                             Smokers             <5.6 [employer] Electrochemiluminescence Immunoassay (ECLIA) Values obtained with different assay methods or kits cannot be used interchangeably.  Results cannot be interpreted as absolute evidence of the presence or absence of malignant disease. Performed At: [facility] [address] [place], [place] [id number] Pearlean Botts MD WU:[id number]    No  results found for: "PSA1" No results found for: [phone]" No results found for: "CAN125"  No results found for: "TOTALPROTELP", "ALBUMINELP", "A1GS", "A2GS", "BETS", "BETA2SER", "GAMS", "MSPIKE", "SPEI" No results found for: "TIBC", "FERRITIN", "IRONPCTSAT" No results found for: "LDH"   STUDIES:   No results found.

## 2024-04-22 ENCOUNTER — Other Ambulatory Visit: Payer: Self-pay | Admitting: *Deleted

## 2024-04-22 ENCOUNTER — Inpatient Hospital Stay: Admitting: Hematology

## 2024-04-22 ENCOUNTER — Inpatient Hospital Stay

## 2024-04-22 VITALS — BP 112/74 | HR 70 | Temp 97.8°F | Resp 18

## 2024-04-22 DIAGNOSIS — C787 Secondary malignant neoplasm of liver and intrahepatic bile duct: Secondary | ICD-10-CM | POA: Diagnosis not present

## 2024-04-22 DIAGNOSIS — Z5111 Encounter for antineoplastic chemotherapy: Secondary | ICD-10-CM | POA: Diagnosis not present

## 2024-04-22 DIAGNOSIS — C20 Malignant neoplasm of rectum: Secondary | ICD-10-CM

## 2024-04-22 DIAGNOSIS — Z95828 Presence of other vascular implants and grafts: Secondary | ICD-10-CM

## 2024-04-22 LAB — COMPREHENSIVE METABOLIC PANEL WITH GFR
ALT: 15 U/L (ref 0–44)
AST: 17 U/L (ref 15–41)
Albumin: 3.5 g/dL (ref 3.5–5.0)
Alkaline Phosphatase: 77 U/L (ref 38–126)
Anion gap: 10 (ref 5–15)
BUN: 11 mg/dL (ref 6–20)
CO2: 24 mmol/L (ref 22–32)
Calcium: 9.2 mg/dL (ref 8.9–10.3)
Chloride: 103 mmol/L (ref 98–111)
Creatinine, Ser: 0.74 mg/dL (ref 0.44–1.00)
GFR, Estimated: >60 mL/min (ref >60)
Glucose, Bld: 110 mg/dL — ABNORMAL HIGH (ref 70–99)
Potassium: 3.6 mmol/L (ref 3.5–5.1)
Sodium: 137 mmol/L (ref 135–145)
Total Bilirubin: 0.6 mg/dL (ref 0.0–1.2)
Total Protein: 6.8 g/dL (ref 6.5–8.1)

## 2024-04-22 LAB — CBC WITH DIFFERENTIAL/PLATELET
Abs Immature Granulocytes: 0 10*3/uL (ref 0.00–0.07)
Basophils Absolute: 0 10*3/uL (ref 0.0–0.1)
Basophils Relative: 1 %
Eosinophils Absolute: 0.2 10*3/uL (ref 0.0–0.5)
Eosinophils Relative: 6 %
HCT: 36 % (ref 36.0–46.0)
Hemoglobin: 11.6 g/dL — ABNORMAL LOW (ref 12.0–15.0)
Immature Granulocytes: 0 %
Lymphocytes Relative: 30 %
Lymphs Abs: 1 10*3/uL (ref 0.7–4.0)
MCH: 31.4 pg (ref 26.0–34.0)
MCHC: 32.2 g/dL (ref 30.0–36.0)
MCV: 97.3 fL (ref 80.0–100.0)
Monocytes Absolute: 0.3 10*3/uL (ref 0.1–1.0)
Monocytes Relative: 10 %
Neutro Abs: 1.8 10*3/uL (ref 1.7–7.7)
Neutrophils Relative %: 53 %
Platelets: 226 10*3/uL (ref 150–400)
RBC: 3.7 MIL/uL — ABNORMAL LOW (ref 3.87–5.11)
RDW: 17.1 % — ABNORMAL HIGH (ref 11.5–15.5)
WBC: 3.3 10*3/uL — ABNORMAL LOW (ref 4.0–10.5)
nRBC: 0 % (ref 0.0–0.2)

## 2024-04-22 LAB — MAGNESIUM: Magnesium: 2.3 mg/dL (ref 1.7–2.4)

## 2024-04-22 MED ORDER — LEUCOVORIN CALCIUM INJECTION 350 MG
400.0000 mg/m2 | Freq: Once | INTRAVENOUS | Status: AC
  Administered 2024-04-22: 736 mg via INTRAVENOUS
  Filled 2024-04-22: qty 36.8

## 2024-04-22 MED ORDER — SODIUM CHLORIDE 0.9 % IV SOLN
2400.0000 mg/m2 | INTRAVENOUS | Status: DC
  Administered 2024-04-22: 4400 mg via INTRAVENOUS
  Filled 2024-04-22: qty 88

## 2024-04-22 MED ORDER — OXALIPLATIN CHEMO INJECTION 100 MG/20ML
65.0000 mg/m2 | Freq: Once | INTRAVENOUS | Status: AC
  Administered 2024-04-22: 120 mg via INTRAVENOUS
  Filled 2024-04-22: qty 20

## 2024-04-22 MED ORDER — SODIUM CHLORIDE FLUSH 0.9 % IV SOLN
10.0000 mL | Freq: Once | INTRAVENOUS | Status: AC
  Administered 2024-04-22: 10 mL via INTRAVENOUS
  Filled 2024-04-22: qty 10

## 2024-04-22 MED ORDER — DEXTROSE 5 % IV SOLN: INTRAVENOUS | Status: DC

## 2024-04-22 MED ORDER — DEXAMETHASONE SODIUM PHOSPHATE 10 MG/ML IJ SOLN
10.0000 mg | Freq: Once | INTRAMUSCULAR | Status: AC
  Administered 2024-04-22: 10 mg via INTRAVENOUS
  Filled 2024-04-22: qty 1

## 2024-04-22 MED ORDER — PALONOSETRON HCL INJECTION 0.25 MG/5ML
0.2500 mg | Freq: Once | INTRAVENOUS | Status: AC
  Administered 2024-04-22: 0.25 mg via INTRAVENOUS
  Filled 2024-04-22: qty 5

## 2024-04-22 MED ORDER — OXYCODONE HCL 5 MG PO TABS: 5.0000 mg | ORAL_TABLET | Freq: Four times a day (QID) | ORAL | 0 refills | Status: DC | PRN

## 2024-04-22 MED ORDER — ALTEPLASE 2 MG IJ SOLR
2.0000 mg | Freq: Once | INTRAMUSCULAR | Status: AC
  Administered 2024-04-22: 2 mg
  Filled 2024-04-22: qty 2

## 2024-04-22 MED ORDER — FLUOROURACIL CHEMO INJECTION 2.5 GM/50ML
400.0000 mg/m2 | Freq: Once | INTRAVENOUS | Status: AC
  Administered 2024-04-22: 750 mg via INTRAVENOUS
  Filled 2024-04-22: qty 15

## 2024-04-22 NOTE — Progress Notes (Signed)
 Patient presents today for chemotherapy infusion. Patient is in satisfactory condition with no new complaints voiced.  Vital signs are stable.  Labs reviewed by Dr. Ellin Saba during the office visit and all labs are within treatment parameters.  We will proceed with treatment per MD orders.   Patient tolerated treatment well with no complaints voiced.  Home infusion 5FU pump connected.   Patient left ambulatory in stable condition.  Vital signs stable at discharge.  Follow up as scheduled.

## 2024-04-22 NOTE — Progress Notes (Signed)
 Patient has been examined by Dr. Ellin Saba. Vital signs and labs have been reviewed by MD - ANC, Creatinine, LFTs, hemoglobin, and platelets are within treatment parameters per M.D. - pt may proceed with treatment.  Primary RN and pharmacy notified.

## 2024-04-22 NOTE — Patient Instructions (Signed)

## 2024-04-22 NOTE — Patient Instructions (Signed)
 [facility] [place] - A DEPT OF [place]  Discharge Instructions: Thank you for choosing [facility] to provide your oncology and hematology care.  If you have a lab appointment with the Cancer Center - please note that after April 8th, 2024, all labs will be drawn in the cancer center.  You do not have to check in or register with the main entrance as you have in the past but will complete your check-in in the cancer center.  Wear comfortable clothing and clothing appropriate for easy access to any Portacath or PICC line.   We strive to give you quality time with your provider. You may need to reschedule your appointment if you arrive late (15 or more minutes).  Arriving late affects you and other patients whose appointments are after yours.  Also, if you miss three or more appointments without notifying the office, you may be dismissed from the clinic at the provider's discretion.      For prescription refill requests, have your pharmacy contact our office and allow 72 hours for refills to be completed.    Today you received the following:  Oxaliplatin /Leucovorin /5FU.  Oxaliplatin  Injection What is this medication? OXALIPLATIN  (ox AL i PLA tin) treats colorectal cancer. It works by slowing down the growth of cancer cells. This medicine may be used for other purposes; ask your health care provider or pharmacist if you have questions. COMMON BRAND NAME(S): Eloxatin  What should I tell my care team before I take this medication? They need to know if you have any of these conditions: Heart disease History of irregular heartbeat or rhythm Liver disease Low blood cell levels (white cells, red cells, and platelets) Lung or breathing disease, such as asthma Take medications that treat or prevent blood clots Tingling of the fingers, toes, or other nerve disorder An unusual or allergic reaction to oxaliplatin , other medications, foods, dyes, or  preservatives If you or your partner are pregnant or trying to get pregnant Breast-feeding How should I use this medication? This medication is injected into a vein. It is given by your care team in a hospital or clinic setting. Talk to your care team about the use of this medication in children. Special care may be needed. Overdosage: If you think you have taken too much of this medicine contact a poison control center or emergency room at once. NOTE: This medicine is only for you. Do not share this medicine with others. What if I miss a dose? Keep appointments for follow-up doses. It is important not to miss a dose. Call your care team if you are unable to keep an appointment. What may interact with this medication? Do not take this medication with any of the following: Cisapride Dronedarone Pimozide Thioridazine This medication may also interact with the following: Aspirin and aspirin-like medications Certain medications that treat or prevent blood clots, such as warfarin, apixaban , dabigatran, and rivaroxaban Cisplatin Cyclosporine Diuretics Medications for infection, such as acyclovir, adefovir, amphotericin B, bacitracin, cidofovir, foscarnet, ganciclovir, gentamicin, pentamidine, vancomycin NSAIDs, medications for pain and inflammation, such as ibuprofen or naproxen Other medications that cause heart rhythm changes Pamidronate Zoledronic acid This list may not describe all possible interactions. Give your health care provider a list of all the medicines, herbs, non-prescription drugs, or dietary supplements you use. Also tell them if you smoke, drink alcohol, or use illegal drugs. Some items may interact with your medicine. What should I watch for while using this medication? Your  condition will be monitored carefully while you are receiving this medication. You may need blood work while taking this medication. This medication may make you feel generally unwell. This is not  uncommon as chemotherapy can affect healthy cells as well as cancer cells. Report any side effects. Continue your course of treatment even though you feel ill unless your care team tells you to stop. This medication may increase your risk of getting an infection. Call your care team for advice if you get a fever, chills, sore throat, or other symptoms of a cold or flu. Do not treat yourself. Try to avoid being around people who are sick. Avoid taking medications that contain aspirin, acetaminophen , ibuprofen, naproxen, or ketoprofen unless instructed by your care team. These medications may hide a fever. Be careful brushing or flossing your teeth or using a toothpick because you may get an infection or bleed more easily. If you have any dental work done, tell your dentist you are receiving this medication. This medication can make you more sensitive to cold. Do not drink cold drinks or use ice. Cover exposed skin before coming in contact with cold temperatures or cold objects. When out in cold weather wear warm clothing and cover your mouth and nose to warm the air that goes into your lungs. Tell your care team if you get sensitive to the cold. Talk to your care team if you or your partner are pregnant or think either of you might be pregnant. This medication can cause serious birth defects if taken during pregnancy and for 9 months after the last dose. A negative pregnancy test is required before starting this medication. A reliable form of contraception is recommended while taking this medication and for 9 months after the last dose. Talk to your care team about effective forms of contraception. Do not father a child while taking this medication and for 6 months after the last dose. Use a condom while having sex during this time period. Do not breastfeed while taking this medication and for 3 months after the last dose. This medication may cause infertility. Talk to your care team if you are concerned about  your fertility. What side effects may I notice from receiving this medication? Side effects that you should report to your care team as soon as possible: Allergic reactions--skin rash, itching, hives, swelling of the face, lips, tongue, or throat Bleeding--bloody or black, tar-like stools, vomiting blood or Luretta Everly material that looks like coffee grounds, red or dark Rydan Gulyas urine, small red or purple spots on skin, unusual bruising or bleeding Dry cough, shortness of breath or trouble breathing Heart rhythm changes--fast or irregular heartbeat, dizziness, feeling faint or lightheaded, chest pain, trouble breathing Infection--fever, chills, cough, sore throat, wounds that don't heal, pain or trouble when passing urine, general feeling of discomfort or being unwell Liver injury--right upper belly pain, loss of appetite, nausea, light-colored stool, dark yellow or Shalawn Wynder urine, yellowing skin or eyes, unusual weakness or fatigue Low red blood cell level--unusual weakness or fatigue, dizziness, headache, trouble breathing Muscle injury--unusual weakness or fatigue, muscle pain, dark yellow or Amiria Orrison urine, decrease in amount of urine Pain, tingling, or numbness in the hands or feet Sudden and severe headache, confusion, change in vision, seizures, which may be signs of posterior reversible encephalopathy syndrome (PRES) Unusual bruising or bleeding Side effects that usually do not require medical attention (report to your care team if they continue or are bothersome): Diarrhea Nausea Pain, redness, or swelling with sores inside the  mouth or throat Unusual weakness or fatigue Vomiting This list may not describe all possible side effects. Call your doctor for medical advice about side effects. You may report side effects to FDA at 1-800-FDA-1088. Where should I keep my medication? This medication is given in a hospital or clinic. It will not be stored at home. NOTE: This sheet is a summary. It may not  cover all possible information. If you have questions about this medicine, talk to your doctor, pharmacist, or health care provider.  2024 Elsevier/Gold Standard (2023-11-02 00:00:00)   Leucovorin  Injection What is this medication? LEUCOVORIN  (loo koe VOR in) prevents side effects from certain medications, such as methotrexate. It works by increasing folate levels. This helps protect healthy cells in your body. It may also be used to treat anemia caused by low levels of folate. It can also be used with fluorouracil , a type of chemotherapy, to treat colorectal cancer. It works by increasing the effects of fluorouracil  in the body. This medicine may be used for other purposes; ask your health care provider or pharmacist if you have questions. What should I tell my care team before I take this medication? They need to know if you have any of these conditions: Anemia from low levels of vitamin B12 in the blood An unusual or allergic reaction to leucovorin , folic acid, other medications, foods, dyes, or preservatives Pregnant or trying to get pregnant Breastfeeding How should I use this medication? This medication is injected into a vein or a muscle. It is given by your care team in a hospital or clinic setting. Talk to your care team about the use of this medication in children. Special care may be needed. Overdosage: If you think you have taken too much of this medicine contact a poison control center or emergency room at once. NOTE: This medicine is only for you. Do not share this medicine with others. What if I miss a dose? Keep appointments for follow-up doses. It is important not to miss your dose. Call your care team if you are unable to keep an appointment. What may interact with this medication? Capecitabine  Fluorouracil  Phenobarbital Phenytoin Primidone Trimethoprim;sulfamethoxazole This list may not describe all possible interactions. Give your health care provider a list of all  the medicines, herbs, non-prescription drugs, or dietary supplements you use. Also tell them if you smoke, drink alcohol, or use illegal drugs. Some items may interact with your medicine. What should I watch for while using this medication? Your condition will be monitored carefully while you are receiving this medication. This medication may increase the side effects of 5-fluorouracil . Tell your care team if you have diarrhea or mouth sores that do not get better or that get worse. What side effects may I notice from receiving this medication? Side effects that you should report to your care team as soon as possible: Allergic reactions--skin rash, itching, hives, swelling of the face, lips, tongue, or throat This list may not describe all possible side effects. Call your doctor for medical advice about side effects. You may report side effects to FDA at 1-800-FDA-1088. Where should I keep my medication? This medication is given in a hospital or clinic. It will not be stored at home. NOTE: This sheet is a summary. It may not cover all possible information. If you have questions about this medicine, talk to your doctor, pharmacist, or health care provider.  2024 Elsevier/Gold Standard (2022-04-25 00:00:00)   Fluorouracil  Injection What is this medication? FLUOROURACIL  (flure oh YOOR a  sil) treats some types of cancer. It works by slowing down the growth of cancer cells. This medicine may be used for other purposes; ask your health care provider or pharmacist if you have questions. COMMON BRAND NAME(S): Adrucil  What should I tell my care team before I take this medication? They need to know if you have any of these conditions: Blood disorders Dihydropyrimidine dehydrogenase (DPD) deficiency Infection, such as chickenpox, cold sores, herpes Kidney disease Liver disease Poor nutrition Recent or ongoing radiation therapy An unusual or allergic reaction to fluorouracil , other medications,  foods, dyes, or preservatives If you or your partner are pregnant or trying to get pregnant Breast-feeding How should I use this medication? This medication is injected into a vein. It is administered by your care team in a hospital or clinic setting. Talk to your care team about the use of this medication in children. Special care may be needed. Overdosage: If you think you have taken too much of this medicine contact a poison control center or emergency room at once. NOTE: This medicine is only for you. Do not share this medicine with others. What if I miss a dose? Keep appointments for follow-up doses. It is important not to miss your dose. Call your care team if you are unable to keep an appointment. What may interact with this medication? Do not take this medication with any of the following: Live virus vaccines This medication may also interact with the following: Medications that treat or prevent blood clots, such as warfarin, enoxaparin , dalteparin This list may not describe all possible interactions. Give your health care provider a list of all the medicines, herbs, non-prescription drugs, or dietary supplements you use. Also tell them if you smoke, drink alcohol, or use illegal drugs. Some items may interact with your medicine. What should I watch for while using this medication? Your condition will be monitored carefully while you are receiving this medication. This medication may make you feel generally unwell. This is not uncommon as chemotherapy can affect healthy cells as well as cancer cells. Report any side effects. Continue your course of treatment even though you feel ill unless your care team tells you to stop. In some cases, you may be given additional medications to help with side effects. Follow all directions for their use. This medication may increase your risk of getting an infection. Call your care team for advice if you get a fever, chills, sore throat, or other  symptoms of a cold or flu. Do not treat yourself. Try to avoid being around people who are sick. This medication may increase your risk to bruise or bleed. Call your care team if you notice any unusual bleeding. Be careful brushing or flossing your teeth or using a toothpick because you may get an infection or bleed more easily. If you have any dental work done, tell your dentist you are receiving this medication. Avoid taking medications that contain aspirin, acetaminophen , ibuprofen, naproxen, or ketoprofen unless instructed by your care team. These medications may hide a fever. Do not treat diarrhea with over the counter products. Contact your care team if you have diarrhea that lasts more than 2 days or if it is severe and watery. This medication can make you more sensitive to the sun. Keep out of the sun. If you cannot avoid being in the sun, wear protective clothing and sunscreen. Do not use sun lamps, tanning beds, or tanning booths. Talk to your care team if you or your partner wish to  become pregnant or think you might be pregnant. This medication can cause serious birth defects if taken during pregnancy and for 3 months after the last dose. A reliable form of contraception is recommended while taking this medication and for 3 months after the last dose. Talk to your care team about effective forms of contraception. Do not father a child while taking this medication and for 3 months after the last dose. Use a condom while having sex during this time period. Do not breastfeed while taking this medication. This medication may cause infertility. Talk to your care team if you are concerned about your fertility. What side effects may I notice from receiving this medication? Side effects that you should report to your care team as soon as possible: Allergic reactions--skin rash, itching, hives, swelling of the face, lips, tongue, or throat Heart attack--pain or tightness in the chest, shoulders, arms,  or jaw, nausea, shortness of breath, cold or clammy skin, feeling faint or lightheaded Heart failure--shortness of breath, swelling of the ankles, feet, or hands, sudden weight gain, unusual weakness or fatigue Heart rhythm changes--fast or irregular heartbeat, dizziness, feeling faint or lightheaded, chest pain, trouble breathing High ammonia level--unusual weakness or fatigue, confusion, loss of appetite, nausea, vomiting, seizures Infection--fever, chills, cough, sore throat, wounds that don't heal, pain or trouble when passing urine, general feeling of discomfort or being unwell Low red blood cell level--unusual weakness or fatigue, dizziness, headache, trouble breathing Pain, tingling, or numbness in the hands or feet, muscle weakness, change in vision, confusion or trouble speaking, loss of balance or coordination, trouble walking, seizures Redness, swelling, and blistering of the skin over hands and feet Severe or prolonged diarrhea Unusual bruising or bleeding Side effects that usually do not require medical attention (report to your care team if they continue or are bothersome): Dry skin Headache Increased tears Nausea Pain, redness, or swelling with sores inside the mouth or throat Sensitivity to light Vomiting This list may not describe all possible side effects. Call your doctor for medical advice about side effects. You may report side effects to FDA at 1-800-FDA-1088. Where should I keep my medication? This medication is given in a hospital or clinic. It will not be stored at home. NOTE: This sheet is a summary. It may not cover all possible information. If you have questions about this medicine, talk to your doctor, pharmacist, or health care provider.  2024 Elsevier/Gold Standard (2022-03-28 00:00:00)    To help prevent nausea and vomiting after your treatment, we encourage you to take your nausea medication as directed.  BELOW ARE SYMPTOMS THAT SHOULD BE REPORTED  IMMEDIATELY: *FEVER GREATER THAN 100.4 F (38 C) OR HIGHER *CHILLS OR SWEATING *NAUSEA AND VOMITING THAT IS NOT CONTROLLED WITH YOUR NAUSEA MEDICATION *UNUSUAL SHORTNESS OF BREATH *UNUSUAL BRUISING OR BLEEDING *URINARY PROBLEMS (pain or burning when urinating, or frequent urination) *BOWEL PROBLEMS (unusual diarrhea, constipation, pain near the anus) TENDERNESS IN MOUTH AND THROAT WITH OR WITHOUT PRESENCE OF ULCERS (sore throat, sores in mouth, or a toothache) UNUSUAL RASH, SWELLING OR PAIN  UNUSUAL VAGINAL DISCHARGE OR ITCHING   Items with * indicate a potential emergency and should be followed up as soon as possible or go to the Emergency Department if any problems should occur.  Please show the CHEMOTHERAPY ALERT CARD or IMMUNOTHERAPY ALERT CARD at check-in to the Emergency Department and triage nurse.  Should you have questions after your visit or need to cancel or reschedule your appointment, please contact [facility]  [place] - A DEPT OF [facility] [phone]  and follow the prompts.  Office hours are 8:00 a.m. to 4:30 p.m. Monday - Friday. Please note that voicemails left after 4:00 p.m. may not be returned until the following business day.  We are closed weekends and major holidays. You have access to a nurse at all times for urgent questions. Please call the main number to the clinic [phone] and follow the prompts.  For any non-urgent questions, you may also contact your provider using MyChart. We now offer e-Visits for anyone 41 and older to request care online for non-urgent symptoms. For details visit [email or web address].   Also download the MyChart app! Go to the app store, search "MyChart", open the app, select [place], and log in with your MyChart username and password.

## 2024-04-22 NOTE — Progress Notes (Signed)
 Port flushed for labs, no blood return noted, alteplase  placed at 0930, primary RN made aware.Aaron Aas

## 2024-04-23 ENCOUNTER — Other Ambulatory Visit: Payer: Self-pay

## 2024-04-24 ENCOUNTER — Inpatient Hospital Stay

## 2024-04-24 VITALS — BP 116/74 | HR 104 | Temp 98.4°F | Resp 19

## 2024-04-24 DIAGNOSIS — Z95828 Presence of other vascular implants and grafts: Secondary | ICD-10-CM

## 2024-04-24 DIAGNOSIS — Z5111 Encounter for antineoplastic chemotherapy: Secondary | ICD-10-CM | POA: Diagnosis not present

## 2024-04-24 DIAGNOSIS — C20 Malignant neoplasm of rectum: Secondary | ICD-10-CM

## 2024-04-24 MED ORDER — HEPARIN SOD (PORK) LOCK FLUSH 100 UNIT/ML IV SOLN
500.0000 [IU] | Freq: Once | INTRAVENOUS | Status: AC | PRN
  Administered 2024-04-24: 500 [IU]

## 2024-04-24 MED ORDER — SODIUM CHLORIDE 0.9% FLUSH
10.0000 mL | INTRAVENOUS | Status: DC | PRN
Start: 2024-04-24 — End: 2024-04-24
  Administered 2024-04-24: 10 mL

## 2024-04-24 MED ORDER — PROCHLORPERAZINE MALEATE 10 MG PO TABS: 10.0000 mg | ORAL_TABLET | Freq: Four times a day (QID) | ORAL | 1 refills | Status: AC | PRN

## 2024-04-24 NOTE — Progress Notes (Signed)
 Patient presents today for 5FU chemotherapy pump disconnection per provider's order. Vital signs stable and patient voiced c/o vomiting and fatigue during the time the chemotherapy pump was on. Patient stated she was out of Compazine . Refill sent to [facility] in [place] for compazine .  Port flushed easily with 10 mL of normal saline and 5 mL of heparin . Good blood return noted and needle removed intact. No bruising or swelling noted at the site.  Discharged from clinic ambulatory in stable condition. Alert and oriented x 3. F/U with [facility] as scheduled.

## 2024-05-02 MED FILL — Fluorouracil IV Soln 5 GM/100ML (50 MG/ML): INTRAVENOUS | Qty: 88 | Status: AC

## 2024-05-02 MED FILL — Fluorouracil IV Soln 2.5 GM/50ML (50 MG/ML): INTRAVENOUS | Qty: 15 | Status: AC

## 2024-05-06 ENCOUNTER — Inpatient Hospital Stay

## 2024-05-06 ENCOUNTER — Other Ambulatory Visit: Payer: Self-pay

## 2024-05-08 ENCOUNTER — Inpatient Hospital Stay

## 2024-05-11 ENCOUNTER — Other Ambulatory Visit: Payer: Self-pay

## 2024-05-13 ENCOUNTER — Inpatient Hospital Stay

## 2024-05-13 ENCOUNTER — Inpatient Hospital Stay: Attending: Hematology

## 2024-05-13 VITALS — BP 119/74 | HR 75 | Temp 96.5°F | Resp 18 | Wt 155.0 lb

## 2024-05-13 DIAGNOSIS — Z5111 Encounter for antineoplastic chemotherapy: Secondary | ICD-10-CM | POA: Diagnosis present

## 2024-05-13 DIAGNOSIS — Z95828 Presence of other vascular implants and grafts: Secondary | ICD-10-CM

## 2024-05-13 DIAGNOSIS — C787 Secondary malignant neoplasm of liver and intrahepatic bile duct: Secondary | ICD-10-CM | POA: Insufficient documentation

## 2024-05-13 DIAGNOSIS — C20 Malignant neoplasm of rectum: Secondary | ICD-10-CM

## 2024-05-13 DIAGNOSIS — C19 Malignant neoplasm of rectosigmoid junction: Secondary | ICD-10-CM | POA: Diagnosis not present

## 2024-05-13 LAB — CBC WITH DIFFERENTIAL/PLATELET
Abs Immature Granulocytes: 0.05 10*3/uL (ref 0.00–0.07)
Basophils Absolute: 0.1 10*3/uL (ref 0.0–0.1)
Basophils Relative: 2 %
Eosinophils Absolute: 0.2 10*3/uL (ref 0.0–0.5)
Eosinophils Relative: 5 %
HCT: 36.1 % (ref 36.0–46.0)
Hemoglobin: 11.5 g/dL — ABNORMAL LOW (ref 12.0–15.0)
Immature Granulocytes: 1 %
Lymphocytes Relative: 27 %
Lymphs Abs: 1.2 10*3/uL (ref 0.7–4.0)
MCH: 30.9 pg (ref 26.0–34.0)
MCHC: 31.9 g/dL (ref 30.0–36.0)
MCV: 97 fL (ref 80.0–100.0)
Monocytes Absolute: 0.8 10*3/uL (ref 0.1–1.0)
Monocytes Relative: 17 %
Neutro Abs: 2.1 10*3/uL (ref 1.7–7.7)
Neutrophils Relative %: 48 %
Platelets: 315 10*3/uL (ref 150–400)
RBC: 3.72 MIL/uL — ABNORMAL LOW (ref 3.87–5.11)
RDW: 17.1 % — ABNORMAL HIGH (ref 11.5–15.5)
WBC: 4.4 10*3/uL (ref 4.0–10.5)
nRBC: 0 % (ref 0.0–0.2)

## 2024-05-13 LAB — COMPREHENSIVE METABOLIC PANEL WITH GFR
ALT: 17 U/L (ref 0–44)
AST: 21 U/L (ref 15–41)
Albumin: 3.3 g/dL — ABNORMAL LOW (ref 3.5–5.0)
Alkaline Phosphatase: 75 U/L (ref 38–126)
Anion gap: 9 (ref 5–15)
BUN: 10 mg/dL (ref 6–20)
CO2: 23 mmol/L (ref 22–32)
Calcium: 8.7 mg/dL — ABNORMAL LOW (ref 8.9–10.3)
Chloride: 105 mmol/L (ref 98–111)
Creatinine, Ser: 0.71 mg/dL (ref 0.44–1.00)
GFR, Estimated: >60 mL/min (ref >60)
Glucose, Bld: 111 mg/dL — ABNORMAL HIGH (ref 70–99)
Potassium: 4 mmol/L (ref 3.5–5.1)
Sodium: 137 mmol/L (ref 135–145)
Total Bilirubin: 0.3 mg/dL (ref 0.0–1.2)
Total Protein: 6.6 g/dL (ref 6.5–8.1)

## 2024-05-13 LAB — MAGNESIUM: Magnesium: 2.2 mg/dL (ref 1.7–2.4)

## 2024-05-13 MED ORDER — OXALIPLATIN CHEMO INJECTION 100 MG/20ML
65.0000 mg/m2 | Freq: Once | INTRAVENOUS | Status: AC
  Administered 2024-05-13: 120 mg via INTRAVENOUS
  Filled 2024-05-13: qty 20

## 2024-05-13 MED ORDER — DEXAMETHASONE SODIUM PHOSPHATE 10 MG/ML IJ SOLN
10.0000 mg | Freq: Once | INTRAMUSCULAR | Status: AC
  Administered 2024-05-13: 10 mg via INTRAVENOUS
  Filled 2024-05-13: qty 1

## 2024-05-13 MED ORDER — LEUCOVORIN CALCIUM INJECTION 350 MG
400.0000 mg/m2 | Freq: Once | INTRAVENOUS | Status: AC
  Administered 2024-05-13: 736 mg via INTRAVENOUS
  Filled 2024-05-13: qty 36.8

## 2024-05-13 MED ORDER — SODIUM CHLORIDE 0.9 % IV SOLN
2400.0000 mg/m2 | INTRAVENOUS | Status: DC
  Administered 2024-05-13: 4400 mg via INTRAVENOUS
  Filled 2024-05-13 (×2): qty 88

## 2024-05-13 MED ORDER — SODIUM CHLORIDE 0.9% FLUSH: 10.0000 mL | INTRAVENOUS | Status: DC | PRN

## 2024-05-13 MED ORDER — HEPARIN SOD (PORK) LOCK FLUSH 100 UNIT/ML IV SOLN
500.0000 [IU] | Freq: Once | INTRAVENOUS | Status: DC | PRN
Start: 2024-05-13 — End: 2024-05-13

## 2024-05-13 MED ORDER — FLUOROURACIL CHEMO INJECTION 2.5 GM/50ML
400.0000 mg/m2 | Freq: Once | INTRAVENOUS | Status: AC
  Administered 2024-05-13: 750 mg via INTRAVENOUS
  Filled 2024-05-13 (×2): qty 15

## 2024-05-13 MED ORDER — DEXTROSE 5 % IV SOLN: INTRAVENOUS | Status: DC

## 2024-05-13 MED ORDER — PALONOSETRON HCL INJECTION 0.25 MG/5ML
0.2500 mg | Freq: Once | INTRAVENOUS | Status: AC
  Administered 2024-05-13: 0.25 mg via INTRAVENOUS
  Filled 2024-05-13: qty 5

## 2024-05-13 NOTE — Patient Instructions (Signed)
 [facility] [place] - A DEPT OF [facility]  Discharge Instructions: Thank you for choosing [facility] to provide your oncology and hematology care.  If you have a lab appointment with the Cancer Center - please note that after April 8th, 2024, all labs will be drawn in the cancer center.  You do not have to check in or register with the main entrance as you have in the past but will complete your check-in in the cancer center.  Wear comfortable clothing and clothing appropriate for easy access to any Portacath or PICC line.   We strive to give you quality time with your provider. You may need to reschedule your appointment if you arrive late (15 or more minutes).  Arriving late affects you and other patients whose appointments are after yours.  Also, if you miss three or more appointments without notifying the office, you may be dismissed from the clinic at the provider's discretion.      For prescription refill requests, have your pharmacy contact our office and allow 72 hours for refills to be completed.    Today you received the following chemotherapy and/or immunotherapy agents Folfox   To help prevent nausea and vomiting after your treatment, we encourage you to take your nausea medication as directed.  BELOW ARE SYMPTOMS THAT SHOULD BE REPORTED IMMEDIATELY: *FEVER GREATER THAN 100.4 F (38 C) OR HIGHER *CHILLS OR SWEATING *NAUSEA AND VOMITING THAT IS NOT CONTROLLED WITH YOUR NAUSEA MEDICATION *UNUSUAL SHORTNESS OF BREATH *UNUSUAL BRUISING OR BLEEDING *URINARY PROBLEMS (pain or burning when urinating, or frequent urination) *BOWEL PROBLEMS (unusual diarrhea, constipation, pain near the anus) TENDERNESS IN MOUTH AND THROAT WITH OR WITHOUT PRESENCE OF ULCERS (sore throat, sores in mouth, or a toothache) UNUSUAL RASH, SWELLING OR PAIN  UNUSUAL VAGINAL DISCHARGE OR ITCHING   Items with * indicate a potential emergency and should be followed up as  soon as possible or go to the Emergency Department if any problems should occur.  Please show the CHEMOTHERAPY ALERT CARD or IMMUNOTHERAPY ALERT CARD at check-in to the Emergency Department and triage nurse.  Should you have questions after your visit or need to cancel or reschedule your appointment, please contact [facility] [place] - A DEPT OF [facility] [phone]  and follow the prompts.  Office hours are 8:00 a.m. to 4:30 p.m. Monday - Friday. Please note that voicemails left after 4:00 p.m. may not be returned until the following business day.  We are closed weekends and major holidays. You have access to a nurse at all times for urgent questions. Please call the main number to the clinic [phone] and follow the prompts.  For any non-urgent questions, you may also contact your provider using MyChart. We now offer e-Visits for anyone 35 and older to request care online for non-urgent symptoms. For details visit [email or web address].   Also download the MyChart app! Go to the app store, search "MyChart", open the app, select [place], and log in with your MyChart username and password.

## 2024-05-13 NOTE — Progress Notes (Signed)
 Patient presents today for Folfox infusion with 5FU pump start per providers order.  Vital signs and labs within parameters for treatment.  Patient has no new complaints at this time.  Treatment given today per MD orders.  Stable during infusion without adverse affects.  Vital signs stable.  No complaints at this time.  5FU pump connected and verified RUN on the screen with the patient.  Discharge from clinic ambulatory in stable condition.  Alert and oriented X 3.  Follow up with [facility] as scheduled.

## 2024-05-15 ENCOUNTER — Inpatient Hospital Stay

## 2024-05-15 VITALS — BP 116/59 | HR 107 | Temp 99.3°F | Resp 20

## 2024-05-15 DIAGNOSIS — C20 Malignant neoplasm of rectum: Secondary | ICD-10-CM

## 2024-05-15 DIAGNOSIS — Z5111 Encounter for antineoplastic chemotherapy: Secondary | ICD-10-CM | POA: Diagnosis not present

## 2024-05-15 DIAGNOSIS — Z95828 Presence of other vascular implants and grafts: Secondary | ICD-10-CM

## 2024-05-15 MED ORDER — HEPARIN SOD (PORK) LOCK FLUSH 100 UNIT/ML IV SOLN
500.0000 [IU] | Freq: Once | INTRAVENOUS | Status: AC | PRN
  Administered 2024-05-15: 500 [IU]

## 2024-05-15 MED ORDER — SODIUM CHLORIDE 0.9% FLUSH
10.0000 mL | INTRAVENOUS | Status: DC | PRN
  Administered 2024-05-15: 10 mL

## 2024-05-15 NOTE — Progress Notes (Signed)
Patient presents today for pump d/c. Vital signs are stable. Port a cath site clean, dry, and intact. Port flushed with 10 mls of Normal Saline and 500 Units of Heparin. Needle removed intact. Band aid applied. Patient has no complaints at this time. Discharged from clinic ambulatory and in stable condition. Patient alert and oriented.  

## 2024-05-20 ENCOUNTER — Inpatient Hospital Stay: Admitting: Hematology

## 2024-05-20 ENCOUNTER — Inpatient Hospital Stay

## 2024-05-22 ENCOUNTER — Inpatient Hospital Stay

## 2024-05-27 ENCOUNTER — Inpatient Hospital Stay

## 2024-05-27 ENCOUNTER — Inpatient Hospital Stay: Admitting: Hematology

## 2024-05-27 VITALS — BP 115/73 | HR 84 | Temp 96.9°F | Resp 18 | Wt 158.1 lb

## 2024-05-27 VITALS — BP 106/72 | HR 76 | Temp 97.3°F | Resp 18

## 2024-05-27 DIAGNOSIS — C20 Malignant neoplasm of rectum: Secondary | ICD-10-CM | POA: Diagnosis not present

## 2024-05-27 DIAGNOSIS — Z95828 Presence of other vascular implants and grafts: Secondary | ICD-10-CM

## 2024-05-27 DIAGNOSIS — Z5111 Encounter for antineoplastic chemotherapy: Secondary | ICD-10-CM | POA: Diagnosis not present

## 2024-05-27 LAB — CBC WITH DIFFERENTIAL/PLATELET
Abs Immature Granulocytes: 0.01 10*3/uL (ref 0.00–0.07)
Basophils Absolute: 0 10*3/uL (ref 0.0–0.1)
Basophils Relative: 1 %
Eosinophils Absolute: 0.5 10*3/uL (ref 0.0–0.5)
Eosinophils Relative: 9 %
HCT: 34.9 % — ABNORMAL LOW (ref 36.0–46.0)
Hemoglobin: 10.9 g/dL — ABNORMAL LOW (ref 12.0–15.0)
Immature Granulocytes: 0 %
Lymphocytes Relative: 19 %
Lymphs Abs: 1 10*3/uL (ref 0.7–4.0)
MCH: 29.9 pg (ref 26.0–34.0)
MCHC: 31.2 g/dL (ref 30.0–36.0)
MCV: 95.6 fL (ref 80.0–100.0)
Monocytes Absolute: 0.5 10*3/uL (ref 0.1–1.0)
Monocytes Relative: 9 %
Neutro Abs: 3.3 10*3/uL (ref 1.7–7.7)
Neutrophils Relative %: 62 %
Platelets: 180 10*3/uL (ref 150–400)
RBC: 3.65 MIL/uL — ABNORMAL LOW (ref 3.87–5.11)
RDW: 17.1 % — ABNORMAL HIGH (ref 11.5–15.5)
WBC: 5.2 10*3/uL (ref 4.0–10.5)
nRBC: 0 % (ref 0.0–0.2)

## 2024-05-27 LAB — COMPREHENSIVE METABOLIC PANEL WITH GFR
ALT: 14 U/L (ref 0–44)
AST: 20 U/L (ref 15–41)
Albumin: 3.3 g/dL — ABNORMAL LOW (ref 3.5–5.0)
Alkaline Phosphatase: 74 U/L (ref 38–126)
Anion gap: 10 (ref 5–15)
BUN: 12 mg/dL (ref 6–20)
CO2: 25 mmol/L (ref 22–32)
Calcium: 9.1 mg/dL (ref 8.9–10.3)
Chloride: 103 mmol/L (ref 98–111)
Creatinine, Ser: 0.69 mg/dL (ref 0.44–1.00)
GFR, Estimated: >60 mL/min (ref >60)
Glucose, Bld: 83 mg/dL (ref 70–99)
Potassium: 4 mmol/L (ref 3.5–5.1)
Sodium: 138 mmol/L (ref 135–145)
Total Bilirubin: 0.4 mg/dL (ref 0.0–1.2)
Total Protein: 6.4 g/dL — ABNORMAL LOW (ref 6.5–8.1)

## 2024-05-27 LAB — MAGNESIUM: Magnesium: 2.1 mg/dL (ref 1.7–2.4)

## 2024-05-27 MED ORDER — SODIUM CHLORIDE 0.9% FLUSH: 10.0000 mL | INTRAVENOUS | Status: DC | PRN

## 2024-05-27 MED ORDER — LEUCOVORIN CALCIUM INJECTION 350 MG
400.0000 mg/m2 | Freq: Once | INTRAVENOUS | Status: AC
  Administered 2024-05-27: 736 mg via INTRAVENOUS
  Filled 2024-05-27: qty 36.8

## 2024-05-27 MED ORDER — FLUOROURACIL CHEMO INJECTION 2.5 GM/50ML
400.0000 mg/m2 | Freq: Once | INTRAVENOUS | Status: AC
  Administered 2024-05-27: 750 mg via INTRAVENOUS
  Filled 2024-05-27: qty 15

## 2024-05-27 MED ORDER — OXALIPLATIN CHEMO INJECTION 100 MG/20ML
65.0000 mg/m2 | Freq: Once | INTRAVENOUS | Status: AC
  Administered 2024-05-27: 120 mg via INTRAVENOUS
  Filled 2024-05-27: qty 20

## 2024-05-27 MED ORDER — HEPARIN SOD (PORK) LOCK FLUSH 100 UNIT/ML IV SOLN
500.0000 [IU] | Freq: Once | INTRAVENOUS | Status: DC | PRN
Start: 2024-05-27 — End: 2024-05-27

## 2024-05-27 MED ORDER — SODIUM CHLORIDE 0.9% FLUSH
10.0000 mL | INTRAVENOUS | Status: DC | PRN
  Administered 2024-05-27: 10 mL via INTRAVENOUS

## 2024-05-27 MED ORDER — DEXAMETHASONE SODIUM PHOSPHATE 10 MG/ML IJ SOLN
10.0000 mg | Freq: Once | INTRAMUSCULAR | Status: AC
  Administered 2024-05-27: 10 mg via INTRAVENOUS
  Filled 2024-05-27: qty 1

## 2024-05-27 MED ORDER — DEXTROSE 5 % IV SOLN: INTRAVENOUS | Status: DC

## 2024-05-27 MED ORDER — PALONOSETRON HCL INJECTION 0.25 MG/5ML
0.2500 mg | Freq: Once | INTRAVENOUS | Status: AC
  Administered 2024-05-27: 0.25 mg via INTRAVENOUS
  Filled 2024-05-27: qty 5

## 2024-05-27 MED ORDER — SODIUM CHLORIDE 0.9 % IV SOLN
2400.0000 mg/m2 | INTRAVENOUS | Status: DC
  Administered 2024-05-27: 4400 mg via INTRAVENOUS
  Filled 2024-05-27: qty 88

## 2024-05-27 NOTE — Patient Instructions (Signed)
 [facility] - A DEPT OF [facility]  Discharge Instructions: Thank you for choosing [facility] to provide your oncology and hematology care.  If you have a lab appointment with the Cancer Center - please note that after April 8th, 2024, all labs will be drawn in the cancer center.  You do not have to check in or register with the main entrance as you have in the past but will complete your check-in in the cancer center.  Wear comfortable clothing and clothing appropriate for easy access to any Portacath or PICC line.   We strive to give you quality time with your provider. You may need to reschedule your appointment if you arrive late (15 or more minutes).  Arriving late affects you and other patients whose appointments are after yours.  Also, if you miss three or more appointments without notifying the office, you may be dismissed from the clinic at the provider's discretion.      For prescription refill requests, have your pharmacy contact our office and allow 72 hours for refills to be completed.    Today you received the following chemotherapy and/or immunotherapy agents FOLFOX 5FU pump start      To help prevent nausea and vomiting after your treatment, we encourage you to take your nausea medication as directed.  BELOW ARE SYMPTOMS THAT SHOULD BE REPORTED IMMEDIATELY: *FEVER GREATER THAN 100.4 F (38 C) OR HIGHER *CHILLS OR SWEATING *NAUSEA AND VOMITING THAT IS NOT CONTROLLED WITH YOUR NAUSEA MEDICATION *UNUSUAL SHORTNESS OF BREATH *UNUSUAL BRUISING OR BLEEDING *URINARY PROBLEMS (pain or burning when urinating, or frequent urination) *BOWEL PROBLEMS (unusual diarrhea, constipation, pain near the anus) TENDERNESS IN MOUTH AND THROAT WITH OR WITHOUT PRESENCE OF ULCERS (sore throat, sores in mouth, or a toothache) UNUSUAL RASH, SWELLING OR PAIN  UNUSUAL VAGINAL DISCHARGE OR ITCHING   Items with * indicate a potential emergency and should be  followed up as soon as possible or go to the Emergency Department if any problems should occur.  Please show the CHEMOTHERAPY ALERT CARD or IMMUNOTHERAPY ALERT CARD at check-in to the Emergency Department and triage nurse.  Should you have questions after your visit or need to cancel or reschedule your appointment, please contact [facility] [place] - A DEPT OF [facility] [phone]  and follow the prompts.  Office hours are 8:00 a.m. to 4:30 p.m. Monday - Friday. Please note that voicemails left after 4:00 p.m. may not be returned until the following business day.  We are closed weekends and major holidays. You have access to a nurse at all times for urgent questions. Please call the main number to the clinic [phone] and follow the prompts.  For any non-urgent questions, you may also contact your provider using MyChart. We now offer e-Visits for anyone 75 and older to request care online for non-urgent symptoms. For details visit [email or web address].   Also download the MyChart app! Go to the app store, search MyChart, open the app, select [facility], and log in with your MyChart username and password.

## 2024-05-27 NOTE — Progress Notes (Signed)
 Patient presents today for FOLFOX infusion and 5FU pump start per providers order.  Vital signs and labs reviewed by MD.  Message received from Farrel Bohr LPN/Dr. Rogers patient okay for treatment.  Treatment given today per MD orders.  Tolerated infusion without adverse affects.  Vital signs stable.  5FU pump connected and verified RUN on the screen with the patient.  No complaints at this time.  Discharge from clinic ambulatory in stable condition.  Alert and oriented X 3.  Follow up with [facility] as scheduled.

## 2024-05-27 NOTE — Progress Notes (Signed)
 Patients port flushed without difficulty.  Good blood return noted with no bruising or swelling noted at site.  Patient remains accessed for treatment.

## 2024-05-27 NOTE — Progress Notes (Signed)
Patient has been assessed, vital signs and labs have been reviewed by Dr. Katragadda. ANC, Creatinine, LFTs, and Platelets are within treatment parameters per Dr. Katragadda. The patient is good to proceed with treatment at this time. Primary RN and pharmacy aware.  

## 2024-05-27 NOTE — Progress Notes (Signed)
 [facility] [address], [place] [address]    Clinic Day:  05/27/2024  Referring physician: Lavell Bari LABOR, FNP  Patient Care Team: Lavell Bari LABOR, FNP as PCP - General (Family Medicine) Rogers Hai, MD as Medical Oncologist (Medical Oncology) Celestia Joesph SQUIBB, RN as Oncology Nurse Navigator (Medical Oncology)   ASSESSMENT & PLAN:   Assessment: 1. 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  Stools have become bloody/mucousy lately. - She also reported pain in the tailbone region since March 2023.  She also has right-sided lower back pain. - 50 pound weight loss in the last 9 months, part of weight loss was intentional.  She cut back on eating sweets and lost taste to sweets after COVID infection. - CT AP with contrast on 03/08/2022: Irregular circumferential masslike wall thickening of sigmoid colon/rectum with adjacent perirectal adenopathy.  Multiple small hypodense lesions in the liver, largest 2.9 cm in the central aspect of the liver, question metastatic disease. - Colonoscopy on 03/21/2022 by Dr. Cindie: Fungating infiltrative nearly completely obstructing mass in the rectosigmoid colon, mass was circumferential measuring 4 cm in length. - Pathology: Rectal mass biopsy consistent with invasive moderately differentiated adenocarcinoma.  As there is very scant invasive tumor, MSI studies were deferred. - PET scan (03/23/2022): Hypermetabolic rectal primary long-segment with SUV 14.3.  Left posterior perirectal lymph node 7 mm with SUV 2.9.  Multiple tiny foci of hepatic hypermetabolism. - MRI of the liver (04/01/2022): Multiple small hypovascular rim-enhancing liver lesions, predominantly in the right hepatic lobe measuring up to 1.1 cm.  3.3 cm hypervascular mass in the central liver, most consistent with FNH/hepatic adenoma. - Liver biopsy (04/20/2022): Metastatic moderately  differentiated colonic adenocarcinoma with mucinous features - NGS testing shows K-ras G12 R mutation, PIK3CA exon 21 mutation.  MS-stable.  TMB-low. - Cycle 1 of FOLFOXIRI on 05/29/2022, bevacizumab  added during cycle 2, cycle 13 on 11/15/2022 -MRI pelvis (10/23/2022): Known metastatic rectal cancer with persistent rectal narrowing and signs of involvement of posterior cervical stroma and anterior peritoneal reflection.  There is evidence of residual tumor in the posterior cervix.  Persistent presacral edema not substantially changed. - MRI liver (11/10/2022): 3 benign lesions and previous small rim-enhancing metastatic lesions in the liver are no longer seen. - Chemoradiation therapy with Xeloda  from 12/11/2022 through 01/18/2023. - 04/04/2023: Robotic assisted LAR, diverting ileostomy, TAH with BSO - Pathology: YpT4b, ypN0, YPM1, 0/12 lymph nodes involved, no tumor deposits identified, margins negative, LVI negative, PNI positive, 1.5 cm grade 2 adenocarcinoma in the rectosigmoid colon, no perforation, MMR preserved. - 11/07/2023: Diverting loop ileostomy takedown - CT CAP (12/30/2023): Left hepatic lobe lesion increased 5.2 x 5.1 cm, previously 3.5 x 3 cm.  Posterior right lower lobe lesion measures 1.8 x 1.7, previously 1.6 x 1.4.  Cavitary nodule in the right lung measures 13 x 10 mm, previously 1211 mm.  3 mm nodule in the left upper lobe is now 6 mm.  This was compared to CT from 10/01/2023. - She had 26-month break from chemotherapy due to surgery. - FOLFIRI from 12/10/2023 through 03/11/2024 with progression. - FOLFOX from 03/25/2024   2. Social/family history: - She is separated and is seen today with her daughter.  She works as a Pensions consultant at [employer].  She is current active smoker, 1 pack/day for 27 years.  Denies drinking alcohol. - Paternal aunt had colon cancer.  Maternal cousin  has breast cancer.  Maternal uncle had leukemia and maternal cousin had leukemia.    Plan: Metastatic rectal cancer to  the liver: - Bevacizumab  is not being given for fistula formation. - CT CAP (03/18/2024): Slight progression of 2 larger metastatic lesions in the liver by few millimeters.  No new lesions. - She completed 4 cycles of FOLFOX. - She is tolerating it well.  Reviewed labs today: Normal LFTs and creatinine.  CBC grossly normal.  Last CEA was 39.9.  Will check CEA level at next visit. - She was seen by gynecological surgeon for rectovaginal fistula repair at [facility].  She is scheduled for CT CAP and MRI of the liver on 06/05/2024.  She was also referred to a hepatobiliary surgeon at [facility].  She may proceed with cycle 5 today without any dose modifications.  RTC 2 weeks for follow-up.  Will review scan results from [facility] at that time.  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 - She was evaluated by gynecological surgeon at [facility].  3.  Perirectal pain: - Continue oxycodone  10 mg once daily as needed.  She is not requiring daily.  4.  Peripheral neuropathy: - Constant tingling in the bottom of the feet and toes is stable since the start of oxaliplatin  on 03/25/2024.    Orders Placed This Encounter  Procedures   CEA    Standing Status:   Future    Expected Date:   06/10/2024    Expiration Date:   06/10/2025   CEA    Standing Status:   Future    Expected Date:   06/24/2024    Expiration Date:   06/24/2025   Magnesium    Standing Status:   Future    Expected Date:   06/24/2024    Expiration Date:   06/24/2025   CBC with Differential    Standing Status:   Future    Expected Date:   06/24/2024    Expiration Date:   06/24/2025   Comprehensive metabolic panel    Standing Status:   Future    Expected Date:   06/24/2024    Expiration Date:   06/24/2025   CEA    Standing Status:   Future    Expected Date:   07/08/2024    Expiration Date:   07/08/2025   Magnesium    Standing Status:   Future    Expected Date:   07/08/2024    Expiration Date:   07/08/2025   CBC with  Differential    Standing Status:   Future    Expected Date:   07/08/2024    Expiration Date:   07/08/2025   Comprehensive metabolic panel    Standing Status:   Future    Expected Date:   07/08/2024    Expiration Date:   07/08/2025      LILLETTE Hummingbird R Teague,acting as a scribe for Alean Stands, MD.,have documented all relevant documentation on the behalf of Alean Stands, MD,as directed by  Alean Stands, MD while in the presence of Alean Stands, MD.  I, Alean Stands MD, have reviewed the above documentation for accuracy and completeness, and I agree with the above.      Alean Stands, MD   6/24/20254:34 PM  CHIEF COMPLAINT:   Diagnosis: metastatic rectal cancer to the liver    Cancer Staging  Rectal cancer [facility]) Staging form: Colon and Rectum, AJCC 8th Edition - Clinical stage from 03/28/2022: Stage IVA (cTX, cN1, cM1a) - Unsigned  Prior Therapy: 1. FOLFOXIRI and bevacizumab , 13 cycles, 05/29/22 - 11/15/22  2. Xeloda  with radiation therapy, 12/11/22 - 01/18/23 3. LAR with diverting ileostomy and TAH w/ BSO, 04/04/23  Current Therapy:  FOLFIRI   HISTORY OF PRESENT ILLNESS:   Oncology History  Rectal cancer (HCC)  03/28/2022 Initial Diagnosis   Rectal cancer (HCC)   05/29/2022 - 07/26/2022 Chemotherapy   Patient is on Treatment Plan : COLORECTAL FOLFOXIRI + Bevacizumab  q14d     05/29/2022 - 11/17/2022 Chemotherapy   Patient is on Treatment Plan : COLORECTAL FOLFOXIRI + Bevacizumab  q14d      Genetic Testing   No pathogenic variants identified on the [employer] CustomNext+RNA panel. The report date is 09/08/2022.  The CustomNext-Cancer+RNAinsight panel offered by [facility] includes sequencing and rearrangement analysis for the following 47 genes:  APC, ATM, AXIN2, BARD1, BMPR1A, BRCA1, BRCA2, BRIP1, CDH1, CDK4, CDKN2A, CHEK2, DICER1, EPCAM, GREM1, HOXB13, MEN1, MLH1, MSH2, MSH3, MSH6, MUTYH, NBN, NF1, NF2, NTHL1, PALB2, PMS2, POLD1, POLE, PTEN,  RAD51C, RAD51D, RECQL, RET, SDHA, SDHAF2, SDHB, SDHC, SDHD, SMAD4, SMARCA4, STK11, TP53, TSC1, TSC2, and VHL.  RNA data is routinely analyzed for use in variant interpretation for all genes.   07/16/2023 - 03/13/2024 Chemotherapy   Patient is on Treatment Plan : COLORECTAL FOLFIRI + Bevacizumab  q14d     03/25/2024 -  Chemotherapy   Patient is on Treatment Plan : COLORECTAL FOLFOX q14d        INTERVAL HISTORY:   Terri Mckinney is a 43 y.o. female presenting to clinic today for follow up of metastatic rectal cancer to the liver. She was last seen by me on 04/22/24.  Today, she states that she is doing well overall. Her appetite level is at 100%. Her energy level is at 75%.   Terri Mckinney reports nausea due to treatment and had one episode of vomiting after second to last treatment on 04/22/24. Cold sensitivity lasts for 1.5 weeks after treatment. Peripheral neuropathy in the form of numbness and tingling in the toes is stable.   She has had surgical consultation at [facility] for her fistula and states she needs to be off all medications and chemotherapy 1 month prior to surgery. She has been referred to Dr. Barbaraann, a [facility] surgical oncologist, with CT CAP and MRI scheduled for 06/05/24. She will see Dr. Barbaraann in consult on 06/09/24. Terri Mckinney was told she may be eligible for trials and, depending on imaging, may be able to remove liver and lung lesions.   Terri Mckinney is not requiring oxycodone  daily, and only takes it prn when pain is severe. She is not taking gabapentin .   PAST MEDICAL HISTORY:   Past Medical History: Past Medical History:  Diagnosis Date   Cancer (HCC)    colorectal cancer   Neuromuscular disorder (HCC)    neuropathy in hands and feet from chemo   Port-A-Cath in place 05/24/2022   upper Rt chest   Tobacco abuse     Surgical History: Past Surgical History:  Procedure Laterality Date   BIOPSY  03/21/2022   Procedure: BIOPSY;  Surgeon: Cindie Carlin POUR, DO;  [facility];   Service: Endoscopy;;   BIOPSY  01/22/2024   Procedure: BIOPSY;  Surgeon: Cindie Carlin POUR, DO;  [facility];  Service: Endoscopy;;   COLONOSCOPY WITH PROPOFOL  N/A 03/21/2022   Procedure: COLONOSCOPY WITH PROPOFOL ;  Surgeon: Cindie Carlin POUR, DO;  [facility];  Service: Endoscopy;  Laterality: N/A;  8:30am   COLONOSCOPY WITH PROPOFOL  N/A 01/22/2024  Procedure: COLONOSCOPY WITH PROPOFOL ;  Surgeon: Cindie Carlin POUR, DO;  [facility];  Service: Endoscopy;  Laterality: N/A;  215pm, asa 2   FLEXIBLE SIGMOIDOSCOPY N/A 05/19/2022   Procedure: FLEXIBLE SIGMOIDOSCOPY WITH TATTOO INJECTION;  Surgeon: Debby Hila, MD;  Location: [facility];  Service: General;  Laterality: N/A;   ILEOSTOMY N/A 04/04/2023   Procedure: DIVERTING ILEOSTOMY;  Surgeon: Debby Hila, MD;  Location: [facility];  Service: General;  Laterality: N/A;   ILEOSTOMY CLOSURE N/A 11/07/2023   Procedure: DIVERTING LOOP ILEOSTOMY TAKEDOWN;  Surgeon: Debby Hila, MD;  Location: [facility];  Service: General;  Laterality: N/A;   LAPAROSCOPIC DIVERTED COLOSTOMY N/A 05/19/2022   Procedure: LAPAROSCOPIC DIVERTED OSTOMY;  Surgeon: Debby Hila, MD;  Location: [facility];  Service: General;  Laterality: N/A;   PORTACATH PLACEMENT Right 05/19/2022   Procedure: INSERTION PORT-A-CATH;  Surgeon: Debby Hila, MD;  Location: [facility];  Service: General;  Laterality: Right;   ROBOTIC ASSISTED TOTAL HYSTERECTOMY WITH BILATERAL SALPINGO OOPHERECTOMY N/A 04/04/2023   Procedure: XI ROBOTIC ASSISTED TOTAL HYSTERECTOMY WITH BILATERAL SALPINGO OOPHORECTOMY;  Surgeon: Viktoria Comer SAUNDERS, MD;  Location: [facility];  Service: Gynecology;  Laterality: N/A;   XI ROBOTIC ASSISTED LOWER ANTERIOR RESECTION N/A 04/04/2023   Procedure: XI ROBOTIC ASSISTED LOWER ANTERIOR RESECTION;  Surgeon: Debby Hila, MD;  Location: [facility];  Service: General;  Laterality: N/A;    Social History: Social History   Socioeconomic History   Marital status: Married     Spouse name: Not on file   Number of children: Not on file   Years of education: Not on file   Highest education level: Not on file  Occupational History   Not on file  Tobacco Use   Smoking status: Every Day    Current packs/day: 0.25    Average packs/day: 0.3 packs/day for 27.0 years (6.8 ttl pk-yrs)    Types: Cigarettes    Passive exposure: Current   Smokeless tobacco: Never  Vaping Use   Vaping status: Never Used  Substance and Sexual Activity   Alcohol use: No   Drug use: No   Sexual activity: Yes    Birth control/protection: Injection  Other Topics Concern   Not on file  Social History Narrative   Not on file   Social Drivers of Health   Financial Resource Strain: Not on file  Food Insecurity: No Food Insecurity (11/08/2023)   Hunger Vital Sign    Worried About Running Out of Food in the Last Year: Never true    Ran Out of Food in the Last Year: Never true  Transportation Needs: No Transportation Needs (11/08/2023)   PRAPARE - Administrator, Civil Service (Medical): No    Lack of Transportation (Non-Medical): No  Physical Activity: Not on file  Stress: Not on file  Social Connections: Not on file  Intimate Partner Violence: Not At Risk (11/08/2023)   Humiliation, Afraid, Rape, and Kick questionnaire    Fear of Current or Ex-Partner: No    Emotionally Abused: No    Physically Abused: No    Sexually Abused: No    Family History: Family History  Problem Relation Age of Onset   Heart disease Mother    Diabetes Mother    Hearing loss Father    Heart disease Father    Hyperlipidemia Father    Hypertension Father    Stroke Father    Arthritis Father    Diabetes Father    Learning disabilities Brother  Diabetes Maternal Grandmother    Heart disease Maternal Grandmother    Diabetes Maternal Grandfather    Diabetes Paternal Grandmother    Diabetes Paternal Grandfather    Leukemia Maternal Uncle        d. 54s   Colon cancer Paternal Aunt         dx 44s   Breast cancer Cousin 33   Lung cancer Maternal Aunt     Current Medications:  Current Outpatient Medications:    estradiol  (ESTRACE  VAGINAL) 0.1 MG/GM vaginal cream, 1 week after surgery, begin to use cream nightly at bedtime for 2 weeks then 3 times a week after to assist with healing vagina. Use a fingertip size amount of cream on finger and place slightly past vaginal entrance. Do not use the applicator that comes with cream., Disp: 42.5 g, Rfl: 3   loperamide  (IMODIUM ) 2 MG capsule, Take 1 capsule (2 mg total) by mouth 4 (four) times daily as needed for diarrhea or loose stools., Disp: 12 capsule, Rfl: 0   Multiple Vitamin (MULTIVITAMIN) tablet, Take 1 tablet by mouth daily., Disp: , Rfl:    nicotine (NICODERM CQ - DOSED IN MG/24 HOURS) 21 mg/24hr patch, Place 1 patch onto the skin., Disp: , Rfl:    nystatin ointment (MYCOSTATIN), Apply topically daily., Disp: , Rfl:    oxyCODONE  (ROXICODONE ) 5 MG immediate release tablet, Take 1 tablet (5 mg total) by mouth every 6 (six) hours as needed for severe pain (pain score 7-10)., Disp: 60 tablet, Rfl: 0   prochlorperazine  (COMPAZINE ) 10 MG tablet, Take 1 tablet (10 mg total) by mouth every 6 (six) hours as needed (Nausea or vomiting)., Disp: 30 tablet, Rfl: 1 No current facility-administered medications for this visit.  Facility-Administered Medications Ordered in Other Visits:    dextrose  5 % solution, , Intravenous, Continuous, Rogers Hai, MD, Stopped at 05/27/24 1419   fluorouracil  (ADRUCIL ) 4,400 mg in sodium chloride  0.9 % 62 mL chemo infusion, 2,400 mg/m2 (Treatment Plan Recorded), Intravenous, 1 day or 1 dose, Selyna Klahn, MD, Infusion Verify at 05/27/24 1441   heparin  lock flush 100 unit/mL, 500 Units, Intracatheter, Once PRN, Linsie Lupo, MD   sodium chloride  flush (NS) 0.9 % injection 10 mL, 10 mL, Intracatheter, PRN, Shaunita Seney, MD   Allergies: Allergies  Allergen Reactions    Wellbutrin  [Bupropion ]     insomnia   Tramadol  Rash    Mainly in chest and neck area    REVIEW OF SYSTEMS:   Review of Systems  Constitutional:  Negative for chills, fatigue and fever.  HENT:   Negative for lump/mass, mouth sores, nosebleeds, sore throat and trouble swallowing.   Eyes:  Negative for eye problems.  Respiratory:  Negative for cough and shortness of breath.   Cardiovascular:  Negative for chest pain, leg swelling and palpitations.  Gastrointestinal:  Positive for nausea. Negative for abdominal pain, constipation, diarrhea and vomiting.  Genitourinary:  Negative for bladder incontinence, difficulty urinating, dysuria, frequency, hematuria and nocturia.   Musculoskeletal:  Negative for arthralgias, back pain, flank pain, myalgias and neck pain.  Skin:  Negative for itching and rash.  Neurological:  Negative for dizziness, headaches and numbness.       +tingling in feet  Hematological:  Does not bruise/bleed easily.  Psychiatric/Behavioral:  Negative for depression, sleep disturbance and suicidal ideas. The patient is not nervous/anxious.   All other systems reviewed and are negative.    VITALS:   Last menstrual period 04/19/2022.  Wt Readings from Last 3  Encounters:  05/27/24 158 lb 1.1 oz (71.7 kg)  05/13/24 155 lb (70.3 kg)  04/22/24 156 lb 9.6 oz (71 kg)    There is no height or weight on file to calculate BMI.  Performance status (ECOG): 0 - Asymptomatic  PHYSICAL EXAM:   Physical Exam Vitals and nursing note reviewed. Exam conducted with a chaperone present.  Constitutional:      Appearance: Normal appearance.   Cardiovascular:     Rate and Rhythm: Normal rate and regular rhythm.     Pulses: Normal pulses.     Heart sounds: Normal heart sounds.  Pulmonary:     Effort: Pulmonary effort is normal.     Breath sounds: Normal breath sounds.  Abdominal:     Palpations: Abdomen is soft. There is no hepatomegaly, splenomegaly or mass.     Tenderness:  There is no abdominal tenderness.   Musculoskeletal:     Right lower leg: No edema.     Left lower leg: No edema.  Lymphadenopathy:     Cervical: No cervical adenopathy.     Right cervical: No superficial, deep or posterior cervical adenopathy.    Left cervical: No superficial, deep or posterior cervical adenopathy.     Upper Body:     Right upper body: No supraclavicular or axillary adenopathy.     Left upper body: No supraclavicular or axillary adenopathy.   Neurological:     General: No focal deficit present.     Mental Status: She is alert and oriented to person, place, and time.   Psychiatric:        Mood and Affect: Mood normal.        Behavior: Behavior normal.     LABS:   CBC     Component Value Date/Time   WBC 5.2 05/27/2024 0908   RBC 3.65 (L) 05/27/2024 0908   HGB 10.9 (L) 05/27/2024 0908   HGB 12.3 03/03/2022 1618   HCT 34.9 (L) 05/27/2024 0908   HCT 35.4 03/03/2022 1618   PLT 180 05/27/2024 0908   PLT 351 03/03/2022 1618   MCV 95.6 05/27/2024 0908   MCV 91 03/03/2022 1618   MCH 29.9 05/27/2024 0908   MCHC 31.2 05/27/2024 0908   RDW 17.1 (H) 05/27/2024 0908   RDW 12.1 03/03/2022 1618   LYMPHSABS 1.0 05/27/2024 0908   LYMPHSABS 3.7 (H) 03/03/2022 1618   MONOABS 0.5 05/27/2024 0908   EOSABS 0.5 05/27/2024 0908   EOSABS 0.9 (H) 03/03/2022 1618   BASOSABS 0.0 05/27/2024 0908   BASOSABS 0.1 03/03/2022 1618    CMP      Component Value Date/Time   NA 138 05/27/2024 0908   NA 144 03/03/2022 1618   K 4.0 05/27/2024 0908   CL 103 05/27/2024 0908   CO2 25 05/27/2024 0908   GLUCOSE 83 05/27/2024 0908   BUN 12 05/27/2024 0908   BUN 10 03/03/2022 1618   CREATININE 0.69 05/27/2024 0908   CALCIUM  9.1 05/27/2024 0908   PROT 6.4 (L) 05/27/2024 0908   PROT 5.8 (L) 03/03/2022 1618   ALBUMIN 3.3 (L) 05/27/2024 0908   ALBUMIN 4.0 03/03/2022 1618   AST 20 05/27/2024 0908   ALT 14 05/27/2024 0908   ALKPHOS 74 05/27/2024 0908   BILITOT 0.4 05/27/2024 0908    BILITOT <0.2 03/03/2022 1618   GFRNONAA >60 05/27/2024 0908   GFRAA 97 07/16/2020 1604     Lab Results  Component Value Date   CEA1 39.9 (H) 03/25/2024   /  CEA  Date Value Ref Range Status  03/25/2024 39.9 (H) 0.0 - 4.7 ng/mL Final    Comment:    (NOTE)                             Nonsmokers          <3.9                             Smokers             <5.6 [employer] Electrochemiluminescence Immunoassay (ECLIA) Values obtained with different assay methods or kits cannot be used interchangeably.  Results cannot be interpreted as absolute evidence of the presence or absence of malignant disease. Performed At: [facility] [address] [place], [place] [id number] Jennette Shorter MD Ey:[phone]    No results found for: PSA1 No results found for: CAN199 No results found for: CAN125  No results found for: TOTALPROTELP, ALBUMINELP, A1GS, A2GS, BETS, BETA2SER, GAMS, MSPIKE, SPEI No results found for: TIBC, FERRITIN, IRONPCTSAT No results found for: LDH   STUDIES:   No results found.

## 2024-05-27 NOTE — Patient Instructions (Addendum)
 [facility]  Discharge Instructions  You were seen and examined today by Dr. Rogers.  Dr. Rogers discussed your most recent lab work and CT scan which revealed that everything looks good and stable.  You will receive your treatment today.  Follow-up as scheduled.    Thank you for choosing [facility] to provide your oncology and hematology care.   To afford each patient quality time with our provider, please arrive at least 15 minutes before your scheduled appointment time. You may need to reschedule your appointment if you arrive late (10 or more minutes). Arriving late affects you and other patients whose appointments are after yours.  Also, if you miss three or more appointments without notifying the office, you may be dismissed from the clinic at the provider's discretion.    Again, thank you for choosing [facility].  Our hope is that these requests will decrease the amount of time that you wait before being seen by our physicians.   If you have a lab appointment with the Cancer Center - please note that after April 8th, all labs will be drawn in the cancer center.  You do not have to check in or register with the main entrance as you have in the past but will complete your check-in at the cancer center.            _____________________________________________________________  Should you have questions after your visit to [facility], please contact our office at [phone] and follow the prompts.  Our office hours are 8:00 a.m. to 4:30 p.m. Monday - Thursday and 8:00 a.m. to 2:30 p.m. Friday.  Please note that voicemails left after 4:00 p.m. may not be returned until the following business day.  We are closed weekends and all major holidays.  You do have access to a nurse 24-7, just call the main number to the clinic [phone] and do not press any options, hold on the line and a nurse will  answer the phone.    For prescription refill requests, have your pharmacy contact our office and allow 72 hours.    Masks are no longer required in the cancer centers. If you would like for your care team to wear a mask while they are taking care of you, please let them know. You may have one support person who is at least 45 years old accompany you for your appointments.

## 2024-05-28 ENCOUNTER — Other Ambulatory Visit: Payer: Self-pay

## 2024-05-29 ENCOUNTER — Inpatient Hospital Stay

## 2024-05-29 VITALS — BP 109/67 | HR 98 | Temp 98.8°F | Resp 20

## 2024-05-29 DIAGNOSIS — Z95828 Presence of other vascular implants and grafts: Secondary | ICD-10-CM

## 2024-05-29 DIAGNOSIS — C20 Malignant neoplasm of rectum: Secondary | ICD-10-CM

## 2024-05-29 DIAGNOSIS — Z5111 Encounter for antineoplastic chemotherapy: Secondary | ICD-10-CM | POA: Diagnosis not present

## 2024-05-29 MED ORDER — HEPARIN SOD (PORK) LOCK FLUSH 100 UNIT/ML IV SOLN
500.0000 [IU] | Freq: Once | INTRAVENOUS | Status: AC | PRN
Start: 2024-05-29 — End: 2024-05-29
  Administered 2024-05-29: 500 [IU]

## 2024-05-29 MED ORDER — SODIUM CHLORIDE 0.9% FLUSH
10.0000 mL | INTRAVENOUS | Status: DC | PRN
  Administered 2024-05-29: 10 mL

## 2024-05-29 NOTE — Progress Notes (Signed)
 Patient presents today for 5FU pump stop and disconnection after 46 hour continous infusion.   5FU pump deaccessed.  Patients port flushed without difficulty.  Good blood return noted with no bruising or swelling noted at site.  Needle removed intact.  Band aid applied.  VSS with discharge and left in satisfactory condition via wheelchair with no s/s of distress noted.

## 2024-06-10 ENCOUNTER — Inpatient Hospital Stay: Attending: Hematology

## 2024-06-10 ENCOUNTER — Inpatient Hospital Stay: Admitting: Hematology

## 2024-06-10 ENCOUNTER — Inpatient Hospital Stay

## 2024-06-10 VITALS — BP 121/71 | HR 85 | Temp 97.2°F | Resp 20

## 2024-06-10 DIAGNOSIS — Z803 Family history of malignant neoplasm of breast: Secondary | ICD-10-CM | POA: Insufficient documentation

## 2024-06-10 DIAGNOSIS — Z806 Family history of leukemia: Secondary | ICD-10-CM | POA: Insufficient documentation

## 2024-06-10 DIAGNOSIS — Z5111 Encounter for antineoplastic chemotherapy: Secondary | ICD-10-CM | POA: Insufficient documentation

## 2024-06-10 DIAGNOSIS — Z95828 Presence of other vascular implants and grafts: Secondary | ICD-10-CM

## 2024-06-10 DIAGNOSIS — C20 Malignant neoplasm of rectum: Secondary | ICD-10-CM

## 2024-06-10 DIAGNOSIS — Z800 Family history of malignant neoplasm of digestive organs: Secondary | ICD-10-CM | POA: Insufficient documentation

## 2024-06-10 DIAGNOSIS — R609 Edema, unspecified: Secondary | ICD-10-CM | POA: Insufficient documentation

## 2024-06-10 DIAGNOSIS — R918 Other nonspecific abnormal finding of lung field: Secondary | ICD-10-CM | POA: Insufficient documentation

## 2024-06-10 DIAGNOSIS — F17210 Nicotine dependence, cigarettes, uncomplicated: Secondary | ICD-10-CM | POA: Insufficient documentation

## 2024-06-10 DIAGNOSIS — G629 Polyneuropathy, unspecified: Secondary | ICD-10-CM | POA: Diagnosis not present

## 2024-06-10 DIAGNOSIS — K6289 Other specified diseases of anus and rectum: Secondary | ICD-10-CM | POA: Insufficient documentation

## 2024-06-10 DIAGNOSIS — C787 Secondary malignant neoplasm of liver and intrahepatic bile duct: Secondary | ICD-10-CM

## 2024-06-10 DIAGNOSIS — N823 Fistula of vagina to large intestine: Secondary | ICD-10-CM | POA: Diagnosis not present

## 2024-06-10 LAB — CBC WITH DIFFERENTIAL/PLATELET
Abs Immature Granulocytes: 0.03 K/uL (ref 0.00–0.07)
Basophils Absolute: 0 K/uL (ref 0.0–0.1)
Basophils Relative: 1 %
Eosinophils Absolute: 0.3 K/uL (ref 0.0–0.5)
Eosinophils Relative: 6 %
HCT: 37.4 % (ref 36.0–46.0)
Hemoglobin: 11.7 g/dL — ABNORMAL LOW (ref 12.0–15.0)
Immature Granulocytes: 1 %
Lymphocytes Relative: 18 %
Lymphs Abs: 0.8 K/uL (ref 0.7–4.0)
MCH: 29.9 pg (ref 26.0–34.0)
MCHC: 31.3 g/dL (ref 30.0–36.0)
MCV: 95.7 fL (ref 80.0–100.0)
Monocytes Absolute: 0.5 K/uL (ref 0.1–1.0)
Monocytes Relative: 10 %
Neutro Abs: 2.9 K/uL (ref 1.7–7.7)
Neutrophils Relative %: 64 %
Platelets: 187 K/uL (ref 150–400)
RBC: 3.91 MIL/uL (ref 3.87–5.11)
RDW: 17.1 % — ABNORMAL HIGH (ref 11.5–15.5)
WBC: 4.5 K/uL (ref 4.0–10.5)
nRBC: 0 % (ref 0.0–0.2)

## 2024-06-10 LAB — COMPREHENSIVE METABOLIC PANEL WITH GFR
ALT: 15 U/L (ref 0–44)
AST: 20 U/L (ref 15–41)
Albumin: 3.3 g/dL — ABNORMAL LOW (ref 3.5–5.0)
Alkaline Phosphatase: 78 U/L (ref 38–126)
Anion gap: 9 (ref 5–15)
BUN: 10 mg/dL (ref 6–20)
CO2: 24 mmol/L (ref 22–32)
Calcium: 9 mg/dL (ref 8.9–10.3)
Chloride: 105 mmol/L (ref 98–111)
Creatinine, Ser: 0.71 mg/dL (ref 0.44–1.00)
GFR, Estimated: >60 mL/min (ref >60)
Glucose, Bld: 123 mg/dL — ABNORMAL HIGH (ref 70–99)
Potassium: 3.9 mmol/L (ref 3.5–5.1)
Sodium: 138 mmol/L (ref 135–145)
Total Bilirubin: 0.4 mg/dL (ref 0.0–1.2)
Total Protein: 6.3 g/dL — ABNORMAL LOW (ref 6.5–8.1)

## 2024-06-10 LAB — MAGNESIUM: Magnesium: 2 mg/dL (ref 1.7–2.4)

## 2024-06-10 MED ORDER — PALONOSETRON HCL INJECTION 0.25 MG/5ML
0.2500 mg | Freq: Once | INTRAVENOUS | Status: AC
  Administered 2024-06-10: 0.25 mg via INTRAVENOUS
  Filled 2024-06-10: qty 5

## 2024-06-10 MED ORDER — OXALIPLATIN CHEMO INJECTION 100 MG/20ML
65.0000 mg/m2 | Freq: Once | INTRAVENOUS | Status: AC
  Administered 2024-06-10: 120 mg via INTRAVENOUS
  Filled 2024-06-10: qty 20

## 2024-06-10 MED ORDER — SODIUM CHLORIDE 0.9% FLUSH
10.0000 mL | INTRAVENOUS | Status: AC
  Administered 2024-06-10: 10 mL

## 2024-06-10 MED ORDER — SODIUM CHLORIDE 0.9% FLUSH: 10.0000 mL | INTRAVENOUS | Status: DC | PRN

## 2024-06-10 MED ORDER — FLUOROURACIL CHEMO INJECTION 2.5 GM/50ML
400.0000 mg/m2 | Freq: Once | INTRAVENOUS | Status: AC
  Administered 2024-06-10: 750 mg via INTRAVENOUS
  Filled 2024-06-10: qty 15

## 2024-06-10 MED ORDER — LEUCOVORIN CALCIUM INJECTION 350 MG
400.0000 mg/m2 | Freq: Once | INTRAVENOUS | Status: AC
  Administered 2024-06-10: 736 mg via INTRAVENOUS
  Filled 2024-06-10: qty 36.8

## 2024-06-10 MED ORDER — DEXAMETHASONE SODIUM PHOSPHATE 10 MG/ML IJ SOLN
10.0000 mg | Freq: Once | INTRAMUSCULAR | Status: AC
  Administered 2024-06-10: 10 mg via INTRAVENOUS
  Filled 2024-06-10: qty 1

## 2024-06-10 MED ORDER — SODIUM CHLORIDE 0.9 % IV SOLN
2400.0000 mg/m2 | INTRAVENOUS | Status: DC
  Administered 2024-06-10: 4400 mg via INTRAVENOUS
  Filled 2024-06-10: qty 88

## 2024-06-10 MED ORDER — DEXTROSE 5 % IV SOLN
INTRAVENOUS | Status: DC
Start: 2024-06-10 — End: 2024-06-10

## 2024-06-10 MED ORDER — HEPARIN SOD (PORK) LOCK FLUSH 100 UNIT/ML IV SOLN: 500.0000 [IU] | Freq: Once | INTRAVENOUS | Status: DC | PRN

## 2024-06-10 NOTE — Patient Instructions (Signed)
 [facility] - A DEPT OF [facility]  Discharge Instructions: Thank you for choosing [facility] to provide your oncology and hematology care.  If you have a lab appointment with the Cancer Center - please note that after April 8th, 2024, all labs will be drawn in the cancer center.  You do not have to check in or register with the main entrance as you have in the past but will complete your check-in in the cancer center.  Wear comfortable clothing and clothing appropriate for easy access to any Portacath or PICC line.   We strive to give you quality time with your provider. You may need to reschedule your appointment if you arrive late (15 or more minutes).  Arriving late affects you and other patients whose appointments are after yours.  Also, if you miss three or more appointments without notifying the office, you may be dismissed from the clinic at the provider's discretion.      For prescription refill requests, have your pharmacy contact our office and allow 72 hours for refills to be completed.    Today you received the following chemotherapy and/or immunotherapy agents Folfox with 5FU pump start      To help prevent nausea and vomiting after your treatment, we encourage you to take your nausea medication as directed.  BELOW ARE SYMPTOMS THAT SHOULD BE REPORTED IMMEDIATELY: *FEVER GREATER THAN 100.4 F (38 C) OR HIGHER *CHILLS OR SWEATING *NAUSEA AND VOMITING THAT IS NOT CONTROLLED WITH YOUR NAUSEA MEDICATION *UNUSUAL SHORTNESS OF BREATH *UNUSUAL BRUISING OR BLEEDING *URINARY PROBLEMS (pain or burning when urinating, or frequent urination) *BOWEL PROBLEMS (unusual diarrhea, constipation, pain near the anus) TENDERNESS IN MOUTH AND THROAT WITH OR WITHOUT PRESENCE OF ULCERS (sore throat, sores in mouth, or a toothache) UNUSUAL RASH, SWELLING OR PAIN  UNUSUAL VAGINAL DISCHARGE OR ITCHING   Items with * indicate a potential emergency and  should be followed up as soon as possible or go to the Emergency Department if any problems should occur.  Please show the CHEMOTHERAPY ALERT CARD or IMMUNOTHERAPY ALERT CARD at check-in to the Emergency Department and triage nurse.  Should you have questions after your visit or need to cancel or reschedule your appointment, please contact [facility]  - A DEPT OF [facility] [phone]  and follow the prompts.  Office hours are 8:00 a.m. to 4:30 p.m. Monday - Friday. Please note that voicemails left after 4:00 p.m. may not be returned until the following business day.  We are closed weekends and major holidays. You have access to a nurse at all times for urgent questions. Please call the main number to the clinic [phone] and follow the prompts.  For any non-urgent questions, you may also contact your provider using MyChart. We now offer e-Visits for anyone 31 and older to request care online for non-urgent symptoms. For details visit [email or web address].   Also download the MyChart app! Go to the app store, search MyChart, open the app, select [place], and log in with your MyChart username and password.

## 2024-06-10 NOTE — Patient Instructions (Signed)

## 2024-06-10 NOTE — Progress Notes (Signed)
 Patient presents today for FOLFOX infusion with 5FU pump start per providers order. Vital signs and labs reviewed by MD.  Message received from Isaiah Piety RN/Dr. Rogers patient okay fro treatment.  Treatment given today per MD orders.  Stable during infusion without adverse affects.  Vital signs stable.  5FU pump started and verified RUN on the screen with the patient.  No complaints at this time.  Discharge from clinic ambulatory in stable condition.  Alert and oriented X 3.  Follow up with [facility] as scheduled.

## 2024-06-10 NOTE — Progress Notes (Signed)
 [facility] [address], [place] [address]    Clinic Day:  06/10/2024  Referring physician: Lavell Bari LABOR, FNP  Patient Care Team: Lavell Bari LABOR, FNP as PCP - General (Family Medicine) Rogers Hai, MD as Medical Oncologist (Medical Oncology) Celestia Joesph SQUIBB, RN as Oncology Nurse Navigator (Medical Oncology)   ASSESSMENT & PLAN:   Assessment: 1. 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  Stools have become bloody/mucousy lately. - She also reported pain in the tailbone region since March 2023.  She also has right-sided lower back pain. - 50 pound weight loss in the last 9 months, part of weight loss was intentional.  She cut back on eating sweets and lost taste to sweets after COVID infection. - CT AP with contrast on 03/08/2022: Irregular circumferential masslike wall thickening of sigmoid colon/rectum with adjacent perirectal adenopathy.  Multiple small hypodense lesions in the liver, largest 2.9 cm in the central aspect of the liver, question metastatic disease. - Colonoscopy on 03/21/2022 by Dr. Cindie: Fungating infiltrative nearly completely obstructing mass in the rectosigmoid colon, mass was circumferential measuring 4 cm in length. - Pathology: Rectal mass biopsy consistent with invasive moderately differentiated adenocarcinoma.  As there is very scant invasive tumor, MSI studies were deferred. - PET scan (03/23/2022): Hypermetabolic rectal primary long-segment with SUV 14.3.  Left posterior perirectal lymph node 7 mm with SUV 2.9.  Multiple tiny foci of hepatic hypermetabolism. - MRI of the liver (04/01/2022): Multiple small hypovascular rim-enhancing liver lesions, predominantly in the right hepatic lobe measuring up to 1.1 cm.  3.3 cm hypervascular mass in the central liver, most consistent with FNH/hepatic adenoma. - Liver biopsy (04/20/2022): Metastatic moderately  differentiated colonic adenocarcinoma with mucinous features - NGS testing shows K-ras G12 R mutation, PIK3CA exon 21 mutation.  MS-stable.  TMB-low. - Cycle 1 of FOLFOXIRI on 05/29/2022, bevacizumab  added during cycle 2, cycle 13 on 11/15/2022 -MRI pelvis (10/23/2022): Known metastatic rectal cancer with persistent rectal narrowing and signs of involvement of posterior cervical stroma and anterior peritoneal reflection.  There is evidence of residual tumor in the posterior cervix.  Persistent presacral edema not substantially changed. - MRI liver (11/10/2022): 3 benign lesions and previous small rim-enhancing metastatic lesions in the liver are no longer seen. - Chemoradiation therapy with Xeloda  from 12/11/2022 through 01/18/2023. - 04/04/2023: Robotic assisted LAR, diverting ileostomy, TAH with BSO - Pathology: YpT4b, ypN0, YPM1, 0/12 lymph nodes involved, no tumor deposits identified, margins negative, LVI negative, PNI positive, 1.5 cm grade 2 adenocarcinoma in the rectosigmoid colon, no perforation, MMR preserved. - 11/07/2023: Diverting loop ileostomy takedown - CT CAP (12/30/2023): Left hepatic lobe lesion increased 5.2 x 5.1 cm, previously 3.5 x 3 cm.  Posterior right lower lobe lesion measures 1.8 x 1.7, previously 1.6 x 1.4.  Cavitary nodule in the right lung measures 13 x 10 mm, previously 1211 mm.  3 mm nodule in the left upper lobe is now 6 mm.  This was compared to CT from 10/01/2023. - She had 16-month break from chemotherapy due to surgery. - FOLFIRI from 12/10/2023 through 03/11/2024 with progression. - FOLFOX from 03/25/2024   2. Social/family history: - She is separated and is seen today with her daughter.  She works as a Pensions consultant at [employer].  She is current active smoker, 1 pack/day for 27 years.  Denies drinking alcohol. - Paternal aunt had colon cancer.  Maternal cousin  has breast cancer.  Maternal uncle had leukemia and maternal cousin had leukemia.    Plan: Metastatic rectal cancer to  the liver: - CT CAP (03/18/2024): Slight progression of 2 larger metastatic lesions in the liver by few millimeters with no new lesions. - She has completed 5 cycles on 05/27/2024. - CT CAP at [facility] (06/05/2024): Left hepatic lobe metastatic lesion measures 5.7 x 5.1 cm, previously 5 x 4.6 cm on CT from 03/18/2024.  Right hepatic dome lesion 2.1 x 2 cm, previously 2 x 1.9 cm.  Lesion within the central liver measures 1 cm, previously 7 mm.  Right upper lobe lung nodule is stable.  No new lung lesions. - MRI of the liver (06/05/2021): 2 hepatic metastatic lesions increased by few millimeters.  Additional smaller lesions of FNH were stable.  No new lesions. - She met with colorectal surgeon Dr. Barbaraann at [facility].  He has recommended hepatic arterial pump.  He would also discuss at tumor board and give final recommendations. - Patient is reluctant to travel to [facility] every 2 weeks. - We discussed the next best option of Y90 treatments to the 2 hepatic metastatic lesions.  She is agreeable to talk to IR team.  Will make referral. - Continue FOLFOX.  Bevacizumab  on hold due to fistula.  RTC 4 weeks.  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 - She was evaluated by gynecological surgeon at [facility].  3.  Perirectal pain: - Continue oxycodone  10 mg once daily as needed.  She is not requiring daily.  4.  Peripheral neuropathy: -Constant tingling in the bottom of the feet and toes is stable since the start of oxaliplatin  on 03/25/2024.    Orders Placed This Encounter  Procedures   IR Radiologist Eval & Mgmt    Standing Status:   Future    Expiration Date:   06/10/2025    Reason for Exam (SYMPTOM  OR DIAGNOSIS REQUIRED):   Y90 treatment    Is the patient pregnant?:   No    Preferred Imaging Location?:   [facility] R Teague,acting as a scribe for Alean Stands, MD.,have documented all relevant documentation on the behalf of Alean Stands,  MD,as directed by  Alean Stands, MD while in the presence of Alean Stands, MD.  I, Alean Stands MD, have reviewed the above documentation for accuracy and completeness, and I agree with the above.      Alean Stands, MD   7/8/20254:31 PM  CHIEF COMPLAINT:   Diagnosis: metastatic rectal cancer to the liver    Cancer Staging  Rectal cancer [facility]) Staging form: Colon and Rectum, AJCC 8th Edition - Clinical stage from 03/28/2022: Stage IVA (cTX, cN1, cM1a) - Unsigned    Prior Therapy: 1. FOLFOXIRI and bevacizumab , 13 cycles, 05/29/22 - 11/15/22  2. Xeloda  with radiation therapy, 12/11/22 - 01/18/23 3. LAR with diverting ileostomy and TAH w/ BSO, 04/04/23  Current Therapy:  FOLFIRI   HISTORY OF PRESENT ILLNESS:   Oncology History  Rectal cancer (HCC)  03/28/2022 Initial Diagnosis   Rectal cancer (HCC)   05/29/2022 - 07/26/2022 Chemotherapy   Patient is on Treatment Plan : COLORECTAL FOLFOXIRI + Bevacizumab  q14d     05/29/2022 - 11/17/2022 Chemotherapy   Patient is on Treatment Plan : COLORECTAL FOLFOXIRI + Bevacizumab  q14d      Genetic Testing   No pathogenic variants identified on the [employer] CustomNext+RNA panel. The report date is 09/08/2022.  The  CustomNext-Cancer+RNAinsight panel offered by [facility] includes sequencing and rearrangement analysis for the following 47 genes:  APC, ATM, AXIN2, BARD1, BMPR1A, BRCA1, BRCA2, BRIP1, CDH1, CDK4, CDKN2A, CHEK2, DICER1, EPCAM, GREM1, HOXB13, MEN1, MLH1, MSH2, MSH3, MSH6, MUTYH, NBN, NF1, NF2, NTHL1, PALB2, PMS2, POLD1, POLE, PTEN, RAD51C, RAD51D, RECQL, RET, SDHA, SDHAF2, SDHB, SDHC, SDHD, SMAD4, SMARCA4, STK11, TP53, TSC1, TSC2, and VHL.  RNA data is routinely analyzed for use in variant interpretation for all genes.   07/16/2023 - 03/13/2024 Chemotherapy   Patient is on Treatment Plan : COLORECTAL FOLFIRI + Bevacizumab  q14d     03/25/2024 -  Chemotherapy   Patient is on Treatment Plan : COLORECTAL FOLFOX q14d         INTERVAL HISTORY:   Terri Mckinney is a 43 y.o. female presenting to clinic today for follow up of metastatic rectal cancer to the liver. She was last seen by me on 05/27/24.  Today, she states that she is doing well overall. Her appetite level is at 100%. Her energy level is at 80%. Terri Mckinney had consultation with oncology who recommended she continue chemotherapy, as well as have a colostomy bag and a pump for HAI. She also reported the oncologist, Dr. Barbaraann, would refer her to [facility] for treatment. Terri Mckinney is agreeable to undergo HAI treatment and chemotherapy, though she does not wish to have a colostomy bag put in place. She would prefer to have chemotherapy at [facility].   PAST MEDICAL HISTORY:   Past Medical History: Past Medical History:  Diagnosis Date   Cancer [facility])    colorectal cancer   Neuromuscular disorder (HCC)    neuropathy in hands and feet from chemo   Port-A-Cath in place 05/24/2022   upper Rt chest   Tobacco abuse     Surgical History: Past Surgical History:  Procedure Laterality Date   BIOPSY  03/21/2022   Procedure: BIOPSY;  Surgeon: Cindie Carlin POUR, DO;  [facility];  Service: Endoscopy;;   BIOPSY  01/22/2024   Procedure: BIOPSY;  Surgeon: Cindie Carlin POUR, DO;  [facility];  Service: Endoscopy;;   COLONOSCOPY WITH PROPOFOL  N/A 03/21/2022   Procedure: COLONOSCOPY WITH PROPOFOL ;  Surgeon: Cindie Carlin POUR, DO;  Location[facility];  Service: Endoscopy;  Laterality: N/A;  8:30am   COLONOSCOPY WITH PROPOFOL  N/A 01/22/2024   Procedure: COLONOSCOPY WITH PROPOFOL ;  Surgeon: Cindie Carlin POUR, DO;  Location: [facility];  Service: Endoscopy;  Laterality: N/A;  215pm, asa 2   FLEXIBLE SIGMOIDOSCOPY N/A 05/19/2022   Procedure: FLEXIBLE SIGMOIDOSCOPY WITH TATTOO INJECTION;  Surgeon: Debby Hila, MD;  Location: [facility];  Service: General;  Laterality: N/A;   ILEOSTOMY N/A 04/04/2023   Procedure: DIVERTING ILEOSTOMY;  Surgeon: Debby Hila, MD;  Location: [facility];  Service: General;  Laterality: N/A;   ILEOSTOMY CLOSURE N/A 11/07/2023   Procedure: DIVERTING LOOP ILEOSTOMY TAKEDOWN;  Surgeon: Debby Hila, MD;  Location: [facility];  Service: General;  Laterality: N/A;   LAPAROSCOPIC DIVERTED COLOSTOMY N/A 05/19/2022   Procedure: LAPAROSCOPIC DIVERTED OSTOMY;  Surgeon: Debby Hila, MD;  Location: [facility];  Service: General;  Laterality: N/A;   PORTACATH PLACEMENT Right 05/19/2022   Procedure: INSERTION PORT-A-CATH;  Surgeon: Debby Hila, MD;  Location: [facility];  Service: General;  Laterality: Right;   ROBOTIC ASSISTED TOTAL HYSTERECTOMY WITH BILATERAL SALPINGO OOPHERECTOMY N/A 04/04/2023   Procedure: XI ROBOTIC ASSISTED TOTAL HYSTERECTOMY WITH BILATERAL SALPINGO OOPHORECTOMY;  Surgeon: Viktoria Comer SAUNDERS, MD;  Location: [facility];  Service: Gynecology;  Laterality: N/A;   XI ROBOTIC ASSISTED LOWER ANTERIOR RESECTION N/A 04/04/2023   Procedure: XI ROBOTIC ASSISTED LOWER ANTERIOR RESECTION;  Surgeon: Debby Hila, MD;  Location: [facility];  Service: General;  Laterality: N/A;    Social History: Social History   Socioeconomic History   Marital status: Married    Spouse name: Not on file   Number of children: Not on file   Years of education: Not on file   Highest education level: Not on file  Occupational History   Not on file  Tobacco Use   Smoking status: Every Day    Current packs/day: 0.25    Average packs/day: 0.3 packs/day for 27.0 years (6.8 ttl pk-yrs)    Types: Cigarettes    Passive exposure: Current   Smokeless tobacco: Never  Vaping Use   Vaping status: Never Used  Substance and Sexual Activity   Alcohol use: No   Drug use: No   Sexual activity: Yes    Birth control/protection: Injection  Other Topics Concern   Not on file  Social History Narrative   Not on file   Social Drivers of Health   Financial Resource Strain: Not on file  Food Insecurity: No Food Insecurity (11/08/2023)   Hunger Vital Sign     Worried About Running Out of Food in the Last Year: Never true    Ran Out of Food in the Last Year: Never true  Transportation Needs: No Transportation Needs (11/08/2023)   PRAPARE - Administrator, Civil Service (Medical): No    Lack of Transportation (Non-Medical): No  Physical Activity: Not on file  Stress: Not on file  Social Connections: Not on file  Intimate Partner Violence: Not At Risk (11/08/2023)   Humiliation, Afraid, Rape, and Kick questionnaire    Fear of Current or Ex-Partner: No    Emotionally Abused: No    Physically Abused: No    Sexually Abused: No    Family History: Family History  Problem Relation Age of Onset   Heart disease Mother    Diabetes Mother    Hearing loss Father    Heart disease Father    Hyperlipidemia Father    Hypertension Father    Stroke Father    Arthritis Father    Diabetes Father    Learning disabilities Brother    Diabetes Maternal Grandmother    Heart disease Maternal Grandmother    Diabetes Maternal Grandfather    Diabetes Paternal Grandmother    Diabetes Paternal Grandfather    Leukemia Maternal Uncle        d. 14s   Colon cancer Paternal Aunt        dx 90s   Breast cancer Cousin 33   Lung cancer Maternal Aunt     Current Medications:  Current Outpatient Medications:    estradiol  (ESTRACE  VAGINAL) 0.1 MG/GM vaginal cream, 1 week after surgery, begin to use cream nightly at bedtime for 2 weeks then 3 times a week after to assist with healing vagina. Use a fingertip size amount of cream on finger and place slightly past vaginal entrance. Do not use the applicator that comes with cream., Disp: 42.5 g, Rfl: 3   loperamide  (IMODIUM ) 2 MG capsule, Take 1 capsule (2 mg total) by mouth 4 (four) times daily as needed for diarrhea or loose stools., Disp: 12 capsule, Rfl: 0   Multiple Vitamin (MULTIVITAMIN) tablet, Take 1 tablet by mouth daily., Disp: , Rfl:    nicotine (NICODERM CQ - DOSED IN  MG/24 HOURS) 21 mg/24hr patch,  Place 1 patch onto the skin., Disp: , Rfl:    nystatin ointment (MYCOSTATIN), Apply topically daily., Disp: , Rfl:    oxyCODONE  (ROXICODONE ) 5 MG immediate release tablet, Take 1 tablet (5 mg total) by mouth every 6 (six) hours as needed for severe pain (pain score 7-10)., Disp: 60 tablet, Rfl: 0   prochlorperazine  (COMPAZINE ) 10 MG tablet, Take 1 tablet (10 mg total) by mouth every 6 (six) hours as needed (Nausea or vomiting)., Disp: 30 tablet, Rfl: 1 No current facility-administered medications for this visit.  Facility-Administered Medications Ordered in Other Visits:    dextrose  5 % solution, , Intravenous, Continuous, Rogers Hai, MD, Stopped at 06/10/24 1423   fluorouracil  (ADRUCIL ) 4,400 mg in sodium chloride  0.9 % 62 mL chemo infusion, 2,400 mg/m2 (Treatment Plan Recorded), Intravenous, 1 day or 1 dose, Terri Wombles, MD, Infusion Verify at 06/10/24 1427   heparin  lock flush 100 unit/mL, 500 Units, Intracatheter, Once PRN, Shauntay Brunelli, MD   sodium chloride  flush (NS) 0.9 % injection 10 mL, 10 mL, Intracatheter, PRN, Adaja Wander, MD   Allergies: Allergies  Allergen Reactions   Wellbutrin  [Bupropion ]     insomnia   Tramadol  Rash    Mainly in chest and neck area    REVIEW OF SYSTEMS:   Review of Systems  Constitutional:  Negative for chills, fatigue and fever.  HENT:   Negative for lump/mass, mouth sores, nosebleeds, sore throat and trouble swallowing.   Eyes:  Negative for eye problems.  Respiratory:  Negative for cough and shortness of breath.   Cardiovascular:  Negative for chest pain, leg swelling and palpitations.  Gastrointestinal:  Positive for rectal pain (4/10 severity). Negative for abdominal pain, constipation, diarrhea, nausea and vomiting.  Genitourinary:  Negative for bladder incontinence, difficulty urinating, dysuria, frequency, hematuria and nocturia.   Musculoskeletal:  Negative for arthralgias, back pain, flank pain, myalgias  and neck pain.  Skin:  Negative for itching and rash.  Neurological:  Negative for dizziness, headaches and numbness.       +tingling in hands and feet  Hematological:  Does not bruise/bleed easily.  Psychiatric/Behavioral:  Negative for depression, sleep disturbance and suicidal ideas. The patient is not nervous/anxious.   All other systems reviewed and are negative.    VITALS:   Last menstrual period 04/19/2022.  Wt Readings from Last 3 Encounters:  06/10/24 162 lb 14.7 oz (73.9 kg)  05/27/24 158 lb 1.1 oz (71.7 kg)  05/13/24 155 lb (70.3 kg)    There is no height or weight on file to calculate BMI.  Performance status (ECOG): 0 - Asymptomatic  PHYSICAL EXAM:   Physical Exam Vitals and nursing note reviewed. Exam conducted with a chaperone present.  Constitutional:      Appearance: Normal appearance.  Cardiovascular:     Rate and Rhythm: Normal rate and regular rhythm.     Pulses: Normal pulses.     Heart sounds: Normal heart sounds.  Pulmonary:     Effort: Pulmonary effort is normal.     Breath sounds: Normal breath sounds.  Abdominal:     Palpations: Abdomen is soft. There is no hepatomegaly, splenomegaly or mass.     Tenderness: There is no abdominal tenderness.  Musculoskeletal:     Right lower leg: No edema.     Left lower leg: No edema.  Lymphadenopathy:     Cervical: No cervical adenopathy.     Right cervical: No superficial, deep or posterior cervical adenopathy.  Left cervical: No superficial, deep or posterior cervical adenopathy.     Upper Body:     Right upper body: No supraclavicular or axillary adenopathy.     Left upper body: No supraclavicular or axillary adenopathy.  Neurological:     General: No focal deficit present.     Mental Status: She is alert and oriented to person, place, and time.  Psychiatric:        Mood and Affect: Mood normal.        Behavior: Behavior normal.     LABS:   CBC     Component Value Date/Time   WBC 4.5  06/10/2024 0948   RBC 3.91 06/10/2024 0948   HGB 11.7 (L) 06/10/2024 0948   HGB 12.3 03/03/2022 1618   HCT 37.4 06/10/2024 0948   HCT 35.4 03/03/2022 1618   PLT 187 06/10/2024 0948   PLT 351 03/03/2022 1618   MCV 95.7 06/10/2024 0948   MCV 91 03/03/2022 1618   MCH 29.9 06/10/2024 0948   MCHC 31.3 06/10/2024 0948   RDW 17.1 (H) 06/10/2024 0948   RDW 12.1 03/03/2022 1618   LYMPHSABS 0.8 06/10/2024 0948   LYMPHSABS 3.7 (H) 03/03/2022 1618   MONOABS 0.5 06/10/2024 0948   EOSABS 0.3 06/10/2024 0948   EOSABS 0.9 (H) 03/03/2022 1618   BASOSABS 0.0 06/10/2024 0948   BASOSABS 0.1 03/03/2022 1618    CMP      Component Value Date/Time   NA 138 06/10/2024 0948   NA 144 03/03/2022 1618   K 3.9 06/10/2024 0948   CL 105 06/10/2024 0948   CO2 24 06/10/2024 0948   GLUCOSE 123 (H) 06/10/2024 0948   BUN 10 06/10/2024 0948   BUN 10 03/03/2022 1618   CREATININE 0.71 06/10/2024 0948   CALCIUM  9.0 06/10/2024 0948   PROT 6.3 (L) 06/10/2024 0948   PROT 5.8 (L) 03/03/2022 1618   ALBUMIN 3.3 (L) 06/10/2024 0948   ALBUMIN 4.0 03/03/2022 1618   AST 20 06/10/2024 0948   ALT 15 06/10/2024 0948   ALKPHOS 78 06/10/2024 0948   BILITOT 0.4 06/10/2024 0948   BILITOT <0.2 03/03/2022 1618   GFRNONAA >60 06/10/2024 0948   GFRAA 97 07/16/2020 1604     Lab Results  Component Value Date   CEA1 39.9 (H) 03/25/2024   /  CEA  Date Value Ref Range Status  03/25/2024 39.9 (H) 0.0 - 4.7 ng/mL Final    Comment:    (NOTE)                             Nonsmokers          <3.9                             Smokers             <5.6 [employer] Electrochemiluminescence Immunoassay (ECLIA) Values obtained with different assay methods or kits cannot be used interchangeably.  Results cannot be interpreted as absolute evidence of the presence or absence of malignant disease. Performed At: [facility] [address] [place], [place] [id number] Jennette Shorter MD Ey:[phone]    No results  found for: PSA1 No results found for: CAN199 No results found for: CAN125  No results found for: TOTALPROTELP, ALBUMINELP, A1GS, A2GS, BETS, BETA2SER, GAMS, MSPIKE, SPEI No results found for: TIBC, FERRITIN, IRONPCTSAT No results found for: LDH   STUDIES:  No results found.

## 2024-06-10 NOTE — Progress Notes (Signed)
 Patient has been examined by Dr. Ellin Saba. Vital signs and labs have been reviewed by MD - ANC, Creatinine, LFTs, hemoglobin, and platelets are within treatment parameters per M.D. - pt may proceed with treatment.  Primary RN and pharmacy notified.

## 2024-06-11 LAB — CEA: CEA: 36.7 ng/mL — ABNORMAL HIGH (ref 0.0–4.7)

## 2024-06-12 ENCOUNTER — Inpatient Hospital Stay

## 2024-06-12 VITALS — BP 113/62 | HR 99 | Temp 96.7°F | Resp 18

## 2024-06-12 DIAGNOSIS — Z95828 Presence of other vascular implants and grafts: Secondary | ICD-10-CM

## 2024-06-12 DIAGNOSIS — C20 Malignant neoplasm of rectum: Secondary | ICD-10-CM

## 2024-06-12 MED ORDER — SODIUM CHLORIDE 0.9% FLUSH: 10.0000 mL | INTRAVENOUS | Status: DC | PRN

## 2024-06-12 MED ORDER — HEPARIN SOD (PORK) LOCK FLUSH 100 UNIT/ML IV SOLN: 500.0000 [IU] | Freq: Once | INTRAVENOUS | Status: DC | PRN

## 2024-06-12 NOTE — Progress Notes (Signed)
 Patient presents today for 5FU pump stop and disconnection after 46 hour continous infusion.   5FU pump deaccessed.  Patients port flushed without difficulty.  Good blood return noted with no bruising or swelling noted at site.  Needle removed intact.  Band aid applied.  VSS with discharge and left in satisfactory condition via wheelchair with no s/s of distress noted.

## 2024-06-24 ENCOUNTER — Inpatient Hospital Stay: Admitting: Hematology

## 2024-06-24 ENCOUNTER — Inpatient Hospital Stay

## 2024-06-24 VITALS — BP 120/73 | HR 75 | Temp 97.1°F | Resp 18 | Wt 165.4 lb

## 2024-06-24 DIAGNOSIS — Z5111 Encounter for antineoplastic chemotherapy: Secondary | ICD-10-CM | POA: Diagnosis not present

## 2024-06-24 DIAGNOSIS — Z95828 Presence of other vascular implants and grafts: Secondary | ICD-10-CM

## 2024-06-24 DIAGNOSIS — C20 Malignant neoplasm of rectum: Secondary | ICD-10-CM

## 2024-06-24 LAB — COMPREHENSIVE METABOLIC PANEL WITH GFR
ALT: 19 U/L (ref 0–44)
AST: 20 U/L (ref 15–41)
Albumin: 3.2 g/dL — ABNORMAL LOW (ref 3.5–5.0)
Alkaline Phosphatase: 74 U/L (ref 38–126)
Anion gap: 8 (ref 5–15)
BUN: 10 mg/dL (ref 6–20)
CO2: 23 mmol/L (ref 22–32)
Calcium: 8.6 mg/dL — ABNORMAL LOW (ref 8.9–10.3)
Chloride: 107 mmol/L (ref 98–111)
Creatinine, Ser: 0.6 mg/dL (ref 0.44–1.00)
GFR, Estimated: >60 mL/min (ref >60)
Glucose, Bld: 87 mg/dL (ref 70–99)
Potassium: 4 mmol/L (ref 3.5–5.1)
Sodium: 138 mmol/L (ref 135–145)
Total Bilirubin: 0.5 mg/dL (ref 0.0–1.2)
Total Protein: 6.2 g/dL — ABNORMAL LOW (ref 6.5–8.1)

## 2024-06-24 LAB — CBC WITH DIFFERENTIAL/PLATELET
Abs Immature Granulocytes: 0.01 K/uL (ref 0.00–0.07)
Basophils Absolute: 0 K/uL (ref 0.0–0.1)
Basophils Relative: 1 %
Eosinophils Absolute: 0.3 K/uL (ref 0.0–0.5)
Eosinophils Relative: 6 %
HCT: 34.6 % — ABNORMAL LOW (ref 36.0–46.0)
Hemoglobin: 11.2 g/dL — ABNORMAL LOW (ref 12.0–15.0)
Immature Granulocytes: 0 %
Lymphocytes Relative: 17 %
Lymphs Abs: 0.8 K/uL (ref 0.7–4.0)
MCH: 31.3 pg (ref 26.0–34.0)
MCHC: 32.4 g/dL (ref 30.0–36.0)
MCV: 96.6 fL (ref 80.0–100.0)
Monocytes Absolute: 0.5 K/uL (ref 0.1–1.0)
Monocytes Relative: 10 %
Neutro Abs: 3.3 K/uL (ref 1.7–7.7)
Neutrophils Relative %: 66 %
Platelets: 168 K/uL (ref 150–400)
RBC: 3.58 MIL/uL — ABNORMAL LOW (ref 3.87–5.11)
RDW: 17.2 % — ABNORMAL HIGH (ref 11.5–15.5)
WBC: 4.9 K/uL (ref 4.0–10.5)
nRBC: 0 % (ref 0.0–0.2)

## 2024-06-24 LAB — MAGNESIUM: Magnesium: 2 mg/dL (ref 1.7–2.4)

## 2024-06-24 MED ORDER — DEXAMETHASONE SODIUM PHOSPHATE 10 MG/ML IJ SOLN
10.0000 mg | Freq: Once | INTRAMUSCULAR | Status: AC
  Administered 2024-06-24: 10 mg via INTRAVENOUS
  Filled 2024-06-24: qty 1

## 2024-06-24 MED ORDER — SODIUM CHLORIDE 0.9 % IV SOLN
2400.0000 mg/m2 | INTRAVENOUS | Status: DC
  Administered 2024-06-24: 4400 mg via INTRAVENOUS
  Filled 2024-06-24: qty 88

## 2024-06-24 MED ORDER — PALONOSETRON HCL INJECTION 0.25 MG/5ML
0.2500 mg | Freq: Once | INTRAVENOUS | Status: AC
  Administered 2024-06-24: 0.25 mg via INTRAVENOUS
  Filled 2024-06-24: qty 5

## 2024-06-24 MED ORDER — OXALIPLATIN CHEMO INJECTION 100 MG/20ML
65.0000 mg/m2 | Freq: Once | INTRAVENOUS | Status: AC
  Administered 2024-06-24: 120 mg via INTRAVENOUS
  Filled 2024-06-24: qty 20

## 2024-06-24 MED ORDER — SODIUM CHLORIDE 0.9% FLUSH
10.0000 mL | Freq: Once | INTRAVENOUS | Status: AC
  Administered 2024-06-24: 10 mL via INTRAVENOUS

## 2024-06-24 MED ORDER — FLUOROURACIL CHEMO INJECTION 2.5 GM/50ML
400.0000 mg/m2 | Freq: Once | INTRAVENOUS | Status: AC
  Administered 2024-06-24: 750 mg via INTRAVENOUS
  Filled 2024-06-24: qty 15

## 2024-06-24 MED ORDER — LEUCOVORIN CALCIUM INJECTION 350 MG
400.0000 mg/m2 | Freq: Once | INTRAVENOUS | Status: AC
  Administered 2024-06-24: 736 mg via INTRAVENOUS
  Filled 2024-06-24: qty 36.8

## 2024-06-24 MED ORDER — DEXTROSE 5 % IV SOLN: INTRAVENOUS | Status: DC

## 2024-06-24 NOTE — Progress Notes (Signed)
 Patient presents today for Folfox infusion. Pre-treatment check list complete. Vital signs and lab work within parameters for treatment.   Folfox given today per MD orders. Tolerated infusion without adverse affects. 5FU pump infusing. RUN noted on the screen and verified with patient. Vital signs stable. No complaints at this time. Discharged from clinic ambulatory in stable condition. Alert and oriented x 3. F/U with [facility] as scheduled.

## 2024-06-25 LAB — CEA: CEA: 41.7 ng/mL — ABNORMAL HIGH (ref 0.0–4.7)

## 2024-06-26 ENCOUNTER — Inpatient Hospital Stay

## 2024-06-26 VITALS — BP 104/56 | HR 97 | Temp 97.1°F | Resp 18

## 2024-06-26 DIAGNOSIS — Z5111 Encounter for antineoplastic chemotherapy: Secondary | ICD-10-CM | POA: Diagnosis not present

## 2024-06-26 DIAGNOSIS — C20 Malignant neoplasm of rectum: Secondary | ICD-10-CM

## 2024-06-26 DIAGNOSIS — Z95828 Presence of other vascular implants and grafts: Secondary | ICD-10-CM

## 2024-06-26 MED ORDER — HEPARIN SOD (PORK) LOCK FLUSH 100 UNIT/ML IV SOLN
500.0000 [IU] | Freq: Once | INTRAVENOUS | Status: AC | PRN
  Administered 2024-06-26: 500 [IU]

## 2024-06-26 MED ORDER — SODIUM CHLORIDE 0.9% FLUSH
10.0000 mL | INTRAVENOUS | Status: DC | PRN
  Administered 2024-06-26: 10 mL

## 2024-06-26 NOTE — Progress Notes (Signed)
 Patient for chemotherapy pump disconnect with no complaints voiced.  Patients port flushed without difficulty.  Good blood return noted with no bruising or swelling noted at site.  Band aid applied.  VSS with discharge and left ambulatory with no s/s of distress noted.

## 2024-06-26 NOTE — Patient Instructions (Signed)
 [facility] [place] - A DEPT OF [place]  Discharge Instructions: Thank you for choosing [facility] to provide your oncology and hematology care.  If you have a lab appointment with the Cancer Center - please note that after April 8th, 2024, all labs will be drawn in the cancer center.  You do not have to check in or register with the main entrance as you have in the past but will complete your check-in in the cancer center.  Wear comfortable clothing and clothing appropriate for easy access to any Portacath or PICC line.   We strive to give you quality time with your provider. You may need to reschedule your appointment if you arrive late (15 or more minutes).  Arriving late affects you and other patients whose appointments are after yours.  Also, if you miss three or more appointments without notifying the office, you may be dismissed from the clinic at the provider's discretion.      For prescription refill requests, have your pharmacy contact our office and allow 72 hours for refills to be completed.    Today   To help prevent nausea and vomiting after your treatment, we encourage you to take your nausea medication as directed.  BELOW ARE SYMPTOMS THAT SHOULD BE REPORTED IMMEDIATELY: *FEVER GREATER THAN 100.4 F (38 C) OR HIGHER *CHILLS OR SWEATING *NAUSEA AND VOMITING THAT IS NOT CONTROLLED WITH YOUR NAUSEA MEDICATION *UNUSUAL SHORTNESS OF BREATH *UNUSUAL BRUISING OR BLEEDING *URINARY PROBLEMS (pain or burning when urinating, or frequent urination) *BOWEL PROBLEMS (unusual diarrhea, constipation, pain near the anus) TENDERNESS IN MOUTH AND THROAT WITH OR WITHOUT PRESENCE OF ULCERS (sore throat, sores in mouth, or a toothache) UNUSUAL RASH, SWELLING OR PAIN  UNUSUAL VAGINAL DISCHARGE OR ITCHING   Items with * indicate a potential emergency and should be followed up as soon as possible or go to the Emergency Department if any problems should  occur.  Please show the CHEMOTHERAPY ALERT CARD or IMMUNOTHERAPY ALERT CARD at check-in to the Emergency Department and triage nurse.  Should you have questions after your visit or need to cancel or reschedule your appointment, please contact [facility] [place] - A DEPT OF [facility] [phone]  and follow the prompts.  Office hours are 8:00 a.m. to 4:30 p.m. Monday - Friday. Please note that voicemails left after 4:00 p.m. may not be returned until the following business day.  We are closed weekends and major holidays. You have access to a nurse at all times for urgent questions. Please call the main number to the clinic [phone] and follow the prompts.  For any non-urgent questions, you may also contact your provider using MyChart. We now offer e-Visits for anyone 53 and older to request care online for non-urgent symptoms. For details visit [email or web address].   Also download the MyChart app! Go to the app store, search MyChart, open the app, select [place], and log in with your MyChart username and password.

## 2024-07-01 ENCOUNTER — Inpatient Hospital Stay

## 2024-07-08 ENCOUNTER — Inpatient Hospital Stay: Admitting: Hematology

## 2024-07-08 ENCOUNTER — Inpatient Hospital Stay

## 2024-07-08 ENCOUNTER — Inpatient Hospital Stay: Attending: Hematology

## 2024-07-08 VITALS — Ht 68.0 in | Wt 167.6 lb

## 2024-07-08 DIAGNOSIS — Z800 Family history of malignant neoplasm of digestive organs: Secondary | ICD-10-CM | POA: Insufficient documentation

## 2024-07-08 DIAGNOSIS — K6289 Other specified diseases of anus and rectum: Secondary | ICD-10-CM | POA: Diagnosis not present

## 2024-07-08 DIAGNOSIS — C20 Malignant neoplasm of rectum: Secondary | ICD-10-CM

## 2024-07-08 DIAGNOSIS — Z5111 Encounter for antineoplastic chemotherapy: Secondary | ICD-10-CM | POA: Diagnosis present

## 2024-07-08 DIAGNOSIS — G629 Polyneuropathy, unspecified: Secondary | ICD-10-CM | POA: Diagnosis not present

## 2024-07-08 DIAGNOSIS — Z801 Family history of malignant neoplasm of trachea, bronchus and lung: Secondary | ICD-10-CM | POA: Insufficient documentation

## 2024-07-08 DIAGNOSIS — N823 Fistula of vagina to large intestine: Secondary | ICD-10-CM | POA: Insufficient documentation

## 2024-07-08 DIAGNOSIS — Z803 Family history of malignant neoplasm of breast: Secondary | ICD-10-CM | POA: Diagnosis not present

## 2024-07-08 DIAGNOSIS — C787 Secondary malignant neoplasm of liver and intrahepatic bile duct: Secondary | ICD-10-CM | POA: Insufficient documentation

## 2024-07-08 DIAGNOSIS — F17210 Nicotine dependence, cigarettes, uncomplicated: Secondary | ICD-10-CM | POA: Insufficient documentation

## 2024-07-08 DIAGNOSIS — C19 Malignant neoplasm of rectosigmoid junction: Secondary | ICD-10-CM | POA: Insufficient documentation

## 2024-07-08 DIAGNOSIS — Z806 Family history of leukemia: Secondary | ICD-10-CM | POA: Insufficient documentation

## 2024-07-08 DIAGNOSIS — Z95828 Presence of other vascular implants and grafts: Secondary | ICD-10-CM

## 2024-07-08 LAB — COMPREHENSIVE METABOLIC PANEL WITH GFR
ALT: 19 U/L (ref 0–44)
AST: 22 U/L (ref 15–41)
Albumin: 3.4 g/dL — ABNORMAL LOW (ref 3.5–5.0)
Alkaline Phosphatase: 81 U/L (ref 38–126)
Anion gap: 12 (ref 5–15)
BUN: 11 mg/dL (ref 6–20)
CO2: 23 mmol/L (ref 22–32)
Calcium: 9.3 mg/dL (ref 8.9–10.3)
Chloride: 104 mmol/L (ref 98–111)
Creatinine, Ser: 0.63 mg/dL (ref 0.44–1.00)
GFR, Estimated: >60 mL/min (ref >60)
Glucose, Bld: 95 mg/dL (ref 70–99)
Potassium: 3.9 mmol/L (ref 3.5–5.1)
Sodium: 139 mmol/L (ref 135–145)
Total Bilirubin: 0.5 mg/dL (ref 0.0–1.2)
Total Protein: 6.6 g/dL (ref 6.5–8.1)

## 2024-07-08 LAB — CBC WITH DIFFERENTIAL/PLATELET
Abs Immature Granulocytes: 0.01 K/uL (ref 0.00–0.07)
Basophils Absolute: 0.1 K/uL (ref 0.0–0.1)
Basophils Relative: 1 %
Eosinophils Absolute: 0.7 K/uL — ABNORMAL HIGH (ref 0.0–0.5)
Eosinophils Relative: 10 %
HCT: 36.2 % (ref 36.0–46.0)
Hemoglobin: 11.9 g/dL — ABNORMAL LOW (ref 12.0–15.0)
Immature Granulocytes: 0 %
Lymphocytes Relative: 17 %
Lymphs Abs: 1.1 K/uL (ref 0.7–4.0)
MCH: 31.6 pg (ref 26.0–34.0)
MCHC: 32.9 g/dL (ref 30.0–36.0)
MCV: 96 fL (ref 80.0–100.0)
Monocytes Absolute: 0.6 K/uL (ref 0.1–1.0)
Monocytes Relative: 9 %
Neutro Abs: 4.2 K/uL (ref 1.7–7.7)
Neutrophils Relative %: 63 %
Platelets: 173 K/uL (ref 150–400)
RBC: 3.77 MIL/uL — ABNORMAL LOW (ref 3.87–5.11)
RDW: 16.9 % — ABNORMAL HIGH (ref 11.5–15.5)
WBC: 6.7 K/uL (ref 4.0–10.5)
nRBC: 0 % (ref 0.0–0.2)

## 2024-07-08 LAB — MAGNESIUM: Magnesium: 1.9 mg/dL (ref 1.7–2.4)

## 2024-07-08 MED ORDER — OXALIPLATIN CHEMO INJECTION 100 MG/20ML
65.0000 mg/m2 | Freq: Once | INTRAVENOUS | Status: AC
  Administered 2024-07-08: 120 mg via INTRAVENOUS
  Filled 2024-07-08: qty 20

## 2024-07-08 MED ORDER — DEXTROSE 5 % IV SOLN: INTRAVENOUS | Status: DC

## 2024-07-08 MED ORDER — FLUOROURACIL CHEMO INJECTION 2.5 GM/50ML
400.0000 mg/m2 | Freq: Once | INTRAVENOUS | Status: AC
  Administered 2024-07-08: 750 mg via INTRAVENOUS
  Filled 2024-07-08: qty 15

## 2024-07-08 MED ORDER — LEUCOVORIN CALCIUM INJECTION 350 MG
400.0000 mg/m2 | Freq: Once | INTRAVENOUS | Status: AC
  Administered 2024-07-08: 736 mg via INTRAVENOUS
  Filled 2024-07-08: qty 36.8

## 2024-07-08 MED ORDER — DEXAMETHASONE SODIUM PHOSPHATE 10 MG/ML IJ SOLN
10.0000 mg | Freq: Once | INTRAMUSCULAR | Status: AC
  Administered 2024-07-08: 10 mg via INTRAVENOUS
  Filled 2024-07-08: qty 1

## 2024-07-08 MED ORDER — SODIUM CHLORIDE 0.9 % IV SOLN
2400.0000 mg/m2 | INTRAVENOUS | Status: DC
  Administered 2024-07-08: 4400 mg via INTRAVENOUS
  Filled 2024-07-08: qty 88

## 2024-07-08 MED ORDER — PALONOSETRON HCL INJECTION 0.25 MG/5ML
0.2500 mg | Freq: Once | INTRAVENOUS | Status: AC
  Administered 2024-07-08: 0.25 mg via INTRAVENOUS
  Filled 2024-07-08: qty 5

## 2024-07-08 NOTE — Patient Instructions (Addendum)
 [facility] - [facility]  Discharge Instructions  You were seen and examined today by Dr. Rogers.  Dr. Rogers discussed your most recent lab work which revealed that everything looks stable.  He discussed treatment options of going to [facility] to have the arterial pump placed or having radiated beads being placed by radiation in [place] due to the two spots that are growing. Please call interventional radiology back to get appointments set up to have the procedure done to place the beads.  Follow-up as scheduled.    Thank you for choosing [facility] to provide your oncology and hematology care.   To afford each patient quality time with our provider, please arrive at least 15 minutes before your scheduled appointment time. You may need to reschedule your appointment if you arrive late (10 or more minutes). Arriving late affects you and other patients whose appointments are after yours.  Also, if you miss three or more appointments without notifying the office, you may be dismissed from the clinic at the provider's discretion.    Again, thank you for choosing [facility].  Our hope is that these requests will decrease the amount of time that you wait before being seen by our physicians.   If you have a lab appointment with the Cancer Center - please note that after April 8th, all labs will be drawn in the cancer center.  You do not have to check in or register with the main entrance as you have in the past but will complete your check-in at the cancer center.            _____________________________________________________________  Should you have questions after your visit to [facility], please contact our office at [phone] and follow the prompts.  Our office hours are 8:00 a.m. to 4:30 p.m. Monday - Thursday and 8:00 a.m. to 2:30 p.m. Friday.  Please note that voicemails left after 4:00 p.m. may not be returned  until the following business day.  We are closed weekends and all major holidays.  You do have access to a nurse 24-7, just call the main number to the clinic [phone] and do not press any options, hold on the line and a nurse will answer the phone.    For prescription refill requests, have your pharmacy contact our office and allow 72 hours.    Masks are no longer required in the cancer centers. If you would like for your care team to wear a mask while they are taking care of you, please let them know. You may have one support person who is at least 45 years old accompany you for your appointments.

## 2024-07-08 NOTE — Patient Instructions (Signed)
 [facility] - A DEPT OF [facility]  Discharge Instructions: Thank you for choosing [facility] to provide your oncology and hematology care.  If you have a lab appointment with the Cancer Center - please note that after April 8th, 2024, all labs will be drawn in the cancer center.  You do not have to check in or register with the main entrance as you have in the past but will complete your check-in in the cancer center.  Wear comfortable clothing and clothing appropriate for easy access to any Portacath or PICC line.   We strive to give you quality time with your provider. You may need to reschedule your appointment if you arrive late (15 or more minutes).  Arriving late affects you and other patients whose appointments are after yours.  Also, if you miss three or more appointments without notifying the office, you may be dismissed from the clinic at the provider's discretion.      For prescription refill requests, have your pharmacy contact our office and allow 72 hours for refills to be completed.   The chemotherapy medication bag should finish at 46 hours, 96 hours, or 7 days. For example, if your pump is scheduled for 46 hours and it was put on at 4:00 p.m., it should finish at 2:00 p.m. the day it is scheduled to come off regardless of your appointment time.     Estimated time to finish at 13:00 pm. .   If the display on your pump reads Low Volume and it is beeping, take the batteries out of the pump and come to the cancer center for it to be taken off.   If the pump alarms go off prior to the pump reading Low Volume then call [phone] and someone can assist you.  If the plunger comes out and the chemotherapy medication is leaking out, please use your home chemo spill kit to clean up the spill. Do NOT use paper towels or other household products.  If you have problems or questions regarding your pump, please call either [phone]  (24 hours a day) or the cancer center Monday-Friday 8:00 a.m.- 4:30 p.m. at the clinic number and we will assist you. If you are unable to get assistance, then go to the nearest Emergency Department and ask the staff to contact the IV team for assistance.    Today you received the following chemotherapy and/or immunotherapy agents Folfox. Fluorouracil  Injection What is this medication? FLUOROURACIL  (flure oh YOOR a sil) treats some types of cancer. It works by slowing down the growth of cancer cells. This medicine may be used for other purposes; ask your health care provider or pharmacist if you have questions. COMMON BRAND NAME(S): Adrucil  What should I tell my care team before I take this medication? They need to know if you have any of these conditions: Blood disorders Dihydropyrimidine dehydrogenase (DPD) deficiency Infection, such as chickenpox, cold sores, herpes Kidney disease Liver disease Poor nutrition Recent or ongoing radiation therapy An unusual or allergic reaction to fluorouracil , other medications, foods, dyes, or preservatives If you or your partner are pregnant or trying to get pregnant Breast-feeding How should I use this medication? This medication is injected into a vein. It is administered by your care team in a hospital or clinic setting. Talk to your care team about the use of this medication in children. Special care may be needed. Overdosage: If you think you have taken too much  of this medicine contact a poison control center or emergency room at once. NOTE: This medicine is only for you. Do not share this medicine with others. What if I miss a dose? Keep appointments for follow-up doses. It is important not to miss your dose. Call your care team if you are unable to keep an appointment. What may interact with this medication? Do not take this medication with any of the following: Live virus vaccines This medication may also interact with the  following: Medications that treat or prevent blood clots, such as warfarin, enoxaparin , dalteparin This list may not describe all possible interactions. Give your health care provider a list of all the medicines, herbs, non-prescription drugs, or dietary supplements you use. Also tell them if you smoke, drink alcohol, or use illegal drugs. Some items may interact with your medicine. What should I watch for while using this medication? Your condition will be monitored carefully while you are receiving this medication. This medication may make you feel generally unwell. This is not uncommon as chemotherapy can affect healthy cells as well as cancer cells. Report any side effects. Continue your course of treatment even though you feel ill unless your care team tells you to stop. In some cases, you may be given additional medications to help with side effects. Follow all directions for their use. This medication may increase your risk of getting an infection. Call your care team for advice if you get a fever, chills, sore throat, or other symptoms of a cold or flu. Do not treat yourself. Try to avoid being around people who are sick. This medication may increase your risk to bruise or bleed. Call your care team if you notice any unusual bleeding. Be careful brushing or flossing your teeth or using a toothpick because you may get an infection or bleed more easily. If you have any dental work done, tell your dentist you are receiving this medication. Avoid taking medications that contain aspirin, acetaminophen , ibuprofen, naproxen, or ketoprofen unless instructed by your care team. These medications may hide a fever. Do not treat diarrhea with over the counter products. Contact your care team if you have diarrhea that lasts more than 2 days or if it is severe and watery. This medication can make you more sensitive to the sun. Keep out of the sun. If you cannot avoid being in the sun, wear protective clothing  and sunscreen. Do not use sun lamps, tanning beds, or tanning booths. Talk to your care team if you or your partner wish to become pregnant or think you might be pregnant. This medication can cause serious birth defects if taken during pregnancy and for 3 months after the last dose. A reliable form of contraception is recommended while taking this medication and for 3 months after the last dose. Talk to your care team about effective forms of contraception. Do not father a child while taking this medication and for 3 months after the last dose. Use a condom while having sex during this time period. Do not breastfeed while taking this medication. This medication may cause infertility. Talk to your care team if you are concerned about your fertility. What side effects may I notice from receiving this medication? Side effects that you should report to your care team as soon as possible: Allergic reactions--skin rash, itching, hives, swelling of the face, lips, tongue, or throat Heart attack--pain or tightness in the chest, shoulders, arms, or jaw, nausea, shortness of breath, cold or clammy skin, feeling faint  or lightheaded Heart failure--shortness of breath, swelling of the ankles, feet, or hands, sudden weight gain, unusual weakness or fatigue Heart rhythm changes--fast or irregular heartbeat, dizziness, feeling faint or lightheaded, chest pain, trouble breathing High ammonia level--unusual weakness or fatigue, confusion, loss of appetite, nausea, vomiting, seizures Infection--fever, chills, cough, sore throat, wounds that don't heal, pain or trouble when passing urine, general feeling of discomfort or being unwell Low red blood cell level--unusual weakness or fatigue, dizziness, headache, trouble breathing Pain, tingling, or numbness in the hands or feet, muscle weakness, change in vision, confusion or trouble speaking, loss of balance or coordination, trouble walking, seizures Redness, swelling, and  blistering of the skin over hands and feet Severe or prolonged diarrhea Unusual bruising or bleeding Side effects that usually do not require medical attention (report to your care team if they continue or are bothersome): Dry skin Headache Increased tears Nausea Pain, redness, or swelling with sores inside the mouth or throat Sensitivity to light Vomiting This list may not describe all possible side effects. Call your doctor for medical advice about side effects. You may report side effects to FDA at 1-800-FDA-1088. Where should I keep my medication? This medication is given in a hospital or clinic. It will not be stored at home. NOTE: This sheet is a summary. It may not cover all possible information. If you have questions about this medicine, talk to your doctor, pharmacist, or health care provider.  2024 Elsevier/Gold Standard (2022-03-28 00:00:00)Leucovorin  Injection What is this medication? LEUCOVORIN  (loo koe VOR in) prevents side effects from certain medications, such as methotrexate. It works by increasing folate levels. This helps protect healthy cells in your body. It may also be used to treat anemia caused by low levels of folate. It can also be used with fluorouracil , a type of chemotherapy, to treat colorectal cancer. It works by increasing the effects of fluorouracil  in the body. This medicine may be used for other purposes; ask your health care provider or pharmacist if you have questions. What should I tell my care team before I take this medication? They need to know if you have any of these conditions: Anemia from low levels of vitamin B12 in the blood An unusual or allergic reaction to leucovorin , folic acid, other medications, foods, dyes, or preservatives Pregnant or trying to get pregnant Breastfeeding How should I use this medication? This medication is injected into a vein or a muscle. It is given by your care team in a hospital or clinic setting. Talk to your  care team about the use of this medication in children. Special care may be needed. Overdosage: If you think you have taken too much of this medicine contact a poison control center or emergency room at once. NOTE: This medicine is only for you. Do not share this medicine with others. What if I miss a dose? Keep appointments for follow-up doses. It is important not to miss your dose. Call your care team if you are unable to keep an appointment. What may interact with this medication? Capecitabine  Fluorouracil  Phenobarbital Phenytoin Primidone Trimethoprim;sulfamethoxazole This list may not describe all possible interactions. Give your health care provider a list of all the medicines, herbs, non-prescription drugs, or dietary supplements you use. Also tell them if you smoke, drink alcohol, or use illegal drugs. Some items may interact with your medicine. What should I watch for while using this medication? Your condition will be monitored carefully while you are receiving this medication. This medication may increase the side  effects of 5-fluorouracil . Tell your care team if you have diarrhea or mouth sores that do not get better or that get worse. What side effects may I notice from receiving this medication? Side effects that you should report to your care team as soon as possible: Allergic reactions--skin rash, itching, hives, swelling of the face, lips, tongue, or throat This list may not describe all possible side effects. Call your doctor for medical advice about side effects. You may report side effects to FDA at 1-800-FDA-1088. Where should I keep my medication? This medication is given in a hospital or clinic. It will not be stored at home. NOTE: This sheet is a summary. It may not cover all possible information. If you have questions about this medicine, talk to your doctor, pharmacist, or health care provider.  2024 Elsevier/Gold Standard (2022-04-25 00:00:00)Oxaliplatin   Injection What is this medication? OXALIPLATIN  (ox AL i PLA tin) treats colorectal cancer. It works by slowing down the growth of cancer cells. This medicine may be used for other purposes; ask your health care provider or pharmacist if you have questions. COMMON BRAND NAME(S): Eloxatin  What should I tell my care team before I take this medication? They need to know if you have any of these conditions: Heart disease History of irregular heartbeat or rhythm Liver disease Low blood cell levels (white cells, red cells, and platelets) Lung or breathing disease, such as asthma Take medications that treat or prevent blood clots Tingling of the fingers, toes, or other nerve disorder An unusual or allergic reaction to oxaliplatin , other medications, foods, dyes, or preservatives If you or your partner are pregnant or trying to get pregnant Breast-feeding How should I use this medication? This medication is injected into a vein. It is given by your care team in a hospital or clinic setting. Talk to your care team about the use of this medication in children. Special care may be needed. Overdosage: If you think you have taken too much of this medicine contact a poison control center or emergency room at once. NOTE: This medicine is only for you. Do not share this medicine with others. What if I miss a dose? Keep appointments for follow-up doses. It is important not to miss a dose. Call your care team if you are unable to keep an appointment. What may interact with this medication? Do not take this medication with any of the following: Cisapride Dronedarone Pimozide Thioridazine This medication may also interact with the following: Aspirin and aspirin-like medications Certain medications that treat or prevent blood clots, such as warfarin, apixaban , dabigatran, and rivaroxaban Cisplatin Cyclosporine Diuretics Medications for infection, such as acyclovir, adefovir, amphotericin B, bacitracin,  cidofovir, foscarnet, ganciclovir, gentamicin, pentamidine, vancomycin NSAIDs, medications for pain and inflammation, such as ibuprofen or naproxen Other medications that cause heart rhythm changes Pamidronate Zoledronic acid This list may not describe all possible interactions. Give your health care provider a list of all the medicines, herbs, non-prescription drugs, or dietary supplements you use. Also tell them if you smoke, drink alcohol, or use illegal drugs. Some items may interact with your medicine. What should I watch for while using this medication? Your condition will be monitored carefully while you are receiving this medication. You may need blood work while taking this medication. This medication may make you feel generally unwell. This is not uncommon as chemotherapy can affect healthy cells as well as cancer cells. Report any side effects. Continue your course of treatment even though you feel ill unless your care  team tells you to stop. This medication may increase your risk of getting an infection. Call your care team for advice if you get a fever, chills, sore throat, or other symptoms of a cold or flu. Do not treat yourself. Try to avoid being around people who are sick. Avoid taking medications that contain aspirin, acetaminophen , ibuprofen, naproxen, or ketoprofen unless instructed by your care team. These medications may hide a fever. Be careful brushing or flossing your teeth or using a toothpick because you may get an infection or bleed more easily. If you have any dental work done, tell your dentist you are receiving this medication. This medication can make you more sensitive to cold. Do not drink cold drinks or use ice. Cover exposed skin before coming in contact with cold temperatures or cold objects. When out in cold weather wear warm clothing and cover your mouth and nose to warm the air that goes into your lungs. Tell your care team if you get sensitive to the cold. Talk  to your care team if you or your partner are pregnant or think either of you might be pregnant. This medication can cause serious birth defects if taken during pregnancy and for 9 months after the last dose. A negative pregnancy test is required before starting this medication. A reliable form of contraception is recommended while taking this medication and for 9 months after the last dose. Talk to your care team about effective forms of contraception. Do not father a child while taking this medication and for 6 months after the last dose. Use a condom while having sex during this time period. Do not breastfeed while taking this medication and for 3 months after the last dose. This medication may cause infertility. Talk to your care team if you are concerned about your fertility. What side effects may I notice from receiving this medication? Side effects that you should report to your care team as soon as possible: Allergic reactions--skin rash, itching, hives, swelling of the face, lips, tongue, or throat Bleeding--bloody or black, tar-like stools, vomiting blood or brown material that looks like coffee grounds, red or dark brown urine, small red or purple spots on skin, unusual bruising or bleeding Dry cough, shortness of breath or trouble breathing Heart rhythm changes--fast or irregular heartbeat, dizziness, feeling faint or lightheaded, chest pain, trouble breathing Infection--fever, chills, cough, sore throat, wounds that don't heal, pain or trouble when passing urine, general feeling of discomfort or being unwell Liver injury--right upper belly pain, loss of appetite, nausea, light-colored stool, dark yellow or brown urine, yellowing skin or eyes, unusual weakness or fatigue Low red blood cell level--unusual weakness or fatigue, dizziness, headache, trouble breathing Muscle injury--unusual weakness or fatigue, muscle pain, dark yellow or brown urine, decrease in amount of urine Pain, tingling, or  numbness in the hands or feet Sudden and severe headache, confusion, change in vision, seizures, which may be signs of posterior reversible encephalopathy syndrome (PRES) Unusual bruising or bleeding Side effects that usually do not require medical attention (report to your care team if they continue or are bothersome): Diarrhea Nausea Pain, redness, or swelling with sores inside the mouth or throat Unusual weakness or fatigue Vomiting This list may not describe all possible side effects. Call your doctor for medical advice about side effects. You may report side effects to FDA at 1-800-FDA-1088. Where should I keep my medication? This medication is given in a hospital or clinic. It will not be stored at home. NOTE: This sheet  is a summary. It may not cover all possible information. If you have questions about this medicine, talk to your doctor, pharmacist, or health care provider.  2024 Elsevier/Gold Standard (2023-11-02 00:00:00)      To help prevent nausea and vomiting after your treatment, we encourage you to take your nausea medication as directed.  BELOW ARE SYMPTOMS THAT SHOULD BE REPORTED IMMEDIATELY: *FEVER GREATER THAN 100.4 F (38 C) OR HIGHER *CHILLS OR SWEATING *NAUSEA AND VOMITING THAT IS NOT CONTROLLED WITH YOUR NAUSEA MEDICATION *UNUSUAL SHORTNESS OF BREATH *UNUSUAL BRUISING OR BLEEDING *URINARY PROBLEMS (pain or burning when urinating, or frequent urination) *BOWEL PROBLEMS (unusual diarrhea, constipation, pain near the anus) TENDERNESS IN MOUTH AND THROAT WITH OR WITHOUT PRESENCE OF ULCERS (sore throat, sores in mouth, or a toothache) UNUSUAL RASH, SWELLING OR PAIN  UNUSUAL VAGINAL DISCHARGE OR ITCHING   Items with * indicate a potential emergency and should be followed up as soon as possible or go to the Emergency Department if any problems should occur.  Please show the CHEMOTHERAPY ALERT CARD or IMMUNOTHERAPY ALERT CARD at check-in to the Emergency Department and  triage nurse.  Should you have questions after your visit or need to cancel or reschedule your appointment, please contact [facility] [place] - A DEPT OF [facility] [phone]  and follow the prompts.  Office hours are 8:00 a.m. to 4:30 p.m. Monday - Friday. Please note that voicemails left after 4:00 p.m. may not be returned until the following business day.  We are closed weekends and major holidays. You have access to a nurse at all times for urgent questions. Please call the main number to the clinic [phone] and follow the prompts.  For any non-urgent questions, you may also contact your provider using MyChart. We now offer e-Visits for anyone 27 and older to request care online for non-urgent symptoms. For details visit [email or web address].   Also download the MyChart app! Go to the app store, search MyChart, open the app, select [facility], and log in with your MyChart username and password.

## 2024-07-08 NOTE — Progress Notes (Signed)
 Patient presents today for follow up visit with Dr. Katragadda and Folfox infusion. Lab work and vital signs within parameters for treatment.   Message received from A.Lenon Peak / Dr. Katragadda to proceed with treatment.

## 2024-07-08 NOTE — Progress Notes (Signed)
 Patients port flushed without difficulty.  Good blood return noted with no bruising or swelling noted at site.  Patient remains accessed for treatment.

## 2024-07-08 NOTE — Progress Notes (Signed)
   07/08/24 1400  Spiritual Encounters  Type of Visit Initial  Care provided to: Patient  Referral source Chaplain assessment  Reason for visit  (Introduction to Spiritual Care)  OnCall Visit No  Spiritual Framework  Presenting Themes Meaning/purpose/sources of inspiration;Goals in life/care;Caregiving needs;Community and relationships  Community/Connection Family;Faith community  Patient Stress Factors None identified  Family Stress Factors Not reviewed  Interventions  Spiritual Care Interventions Made Established relationship of care and support;Compassionate presence;Narrative/life review;Encouragement  Intervention Outcomes  Outcomes Connection to spiritual care;Awareness of support  Spiritual Care Plan  Spiritual Care Issues Still Outstanding Chaplain will continue to follow   Reason for Visit: Chaplain identified Pt on the schedule as a Pt I had not connected with yet and visited to deliver introduction to Spiritual Care  Description of Visit: I found Terri Mckinney sitting in the recliner receiving her treatment. No support person was present at this time.  I introduced myself as the chaplain for the cancer center and offered a brief education on the role of a chaplain and the support we can offer to our patients, caregivers, and staff.    Terri Mckinney was willing to speak with me and she began by sharing her faith with me and how she is totally reliant upon God for her strength and support.  As I engaged in curious questions she shared about her home church and how they are planting a new church and so she is attending both locations with various family members.  Terri Mckinney described a strong support system made up mostly of her sisters and some from her spiritual community.  She is most closely supported by one sister who is also being treated for cancer, and the 2 of them are renovating a house they bought.  Life review revealed that Terri Mckinney has 2 children ages 42 (daughter, LPN at [employer]  in the ED) and an 63 y/0 son heading to [place] tech for [employer] account executive.  Plan of Care: Terri Mckinney appears to have good home support at this time and appropriate coping skills.  I will continue to follow up with her on a monthly basis for now.  Terri Mckinney, MDiv  Chaplain, [facility] [email or web address] [phone]

## 2024-07-08 NOTE — Progress Notes (Incomplete)
 [facility] [address] [place] [address]    Clinic Day:  07/10/2024  Referring physician: Lavell Bari LABOR, FNP  Patient Care Team: Lavell Bari LABOR, FNP as PCP - General (Family Medicine) Rogers Hai, MD as Medical Oncologist (Medical Oncology) Celestia Joesph SQUIBB, RN as Oncology Nurse Navigator (Medical Oncology)   ASSESSMENT & PLAN:   Assessment: 1. 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  Stools have become bloody/mucousy lately. - She also reported pain in the tailbone region since March 2023.  She also has right-sided lower back pain. - 50 pound weight loss in the last 9 months, part of weight loss was intentional.  She cut back on eating sweets and lost taste to sweets after COVID infection. - CT AP with contrast on 03/08/2022: Irregular circumferential masslike wall thickening of sigmoid colon/rectum with adjacent perirectal adenopathy.  Multiple small hypodense lesions in the liver, largest 2.9 cm in the central aspect of the liver, question metastatic disease. - Colonoscopy on 03/21/2022 by Dr. Cindie: Fungating infiltrative nearly completely obstructing mass in the rectosigmoid colon, mass was circumferential measuring 4 cm in length. - Pathology: Rectal mass biopsy consistent with invasive moderately differentiated adenocarcinoma.  As there is very scant invasive tumor, MSI studies were deferred. - PET scan (03/23/2022): Hypermetabolic rectal primary long-segment with SUV 14.3.  Left posterior perirectal lymph node 7 mm with SUV 2.9.  Multiple tiny foci of hepatic hypermetabolism. - MRI of the liver (04/01/2022): Multiple small hypovascular rim-enhancing liver lesions, predominantly in the right hepatic lobe measuring up to 1.1 cm.  3.3 cm hypervascular mass in the central liver, most consistent with FNH/hepatic adenoma. - Liver biopsy (04/20/2022): Metastatic moderately  differentiated colonic adenocarcinoma with mucinous features - NGS testing shows K-ras G12 R mutation, PIK3CA exon 21 mutation.  MS-stable.  TMB-low. - Cycle 1 of FOLFOXIRI on 05/29/2022, bevacizumab  added during cycle 2, cycle 13 on 11/15/2022 -MRI pelvis (10/23/2022): Known metastatic rectal cancer with persistent rectal narrowing and signs of involvement of posterior cervical stroma and anterior peritoneal reflection.  There is evidence of residual tumor in the posterior cervix.  Persistent presacral edema not substantially changed. - MRI liver (11/10/2022): 3 benign lesions and previous small rim-enhancing metastatic lesions in the liver are no longer seen. - Chemoradiation therapy with Xeloda  from 12/11/2022 through 01/18/2023. - 04/04/2023: Robotic assisted LAR, diverting ileostomy, TAH with BSO - Pathology: YpT4b, ypN0, YPM1, 0/12 lymph nodes involved, no tumor deposits identified, margins negative, LVI negative, PNI positive, 1.5 cm grade 2 adenocarcinoma in the rectosigmoid colon, no perforation, MMR preserved. - 11/07/2023: Diverting loop ileostomy takedown - CT CAP (12/30/2023): Left hepatic lobe lesion increased 5.2 x 5.1 cm, previously 3.5 x 3 cm.  Posterior right lower lobe lesion measures 1.8 x 1.7, previously 1.6 x 1.4.  Cavitary nodule in the right lung measures 13 x 10 mm, previously 1211 mm.  3 mm nodule in the left upper lobe is now 6 mm.  This was compared to CT from 10/01/2023. - She had 69-month break from chemotherapy due to surgery. - FOLFIRI from 12/10/2023 through 03/11/2024 with progression. - FOLFOX from 03/25/2024   2. Social/family history: - She is separated and is seen today with her daughter.  She works as a Pensions consultant at [employer].  She is current active smoker, 1 pack/day for 27 years.  Denies drinking alcohol. - Paternal aunt had colon cancer.  Maternal cousin  has breast cancer.  Maternal uncle had leukemia and maternal cousin had leukemia.    Plan: Metastatic rectal cancer to  the liver: - CT CAP (03/18/2024): Slight progression of 2 larger metastatic lesions in the liver by few millimeters with no new lesions. - She has completed 5 cycles on 05/27/2024. - CT CAP at [facility] (06/05/2024): Left hepatic lobe metastatic lesion measures 5.7 x 5.1 cm, previously 5 x 4.6 cm on CT from 03/18/2024.  Right hepatic dome lesion 2.1 x 2 cm, previously 2 x 1.9 cm.  Lesion within the central liver measures 1 cm, previously 7 mm.  Right upper lobe lung nodule is stable.  No new lung lesions. - MRI of the liver (06/05/2021): 2 hepatic metastatic lesions increased by few millimeters.  Additional smaller lesions of FNH were stable.  No new lesions. - She met with colorectal surgeon Dr. Barbaraann at [facility].  He has recommended hepatic arterial pump which requires patient to travel to [facility] every 2 weeks for at least 6 months.  She is reluctant to consider this option due to travel. - I have recommended the next best option of Y90/liver directed therapy for those 2 hepatic lesions which are increasing in size.  We have made a referral to our IR. - In the interim we will continue FOLFOX every 2 weeks.  RTC 4 weeks for toxicity evaluation.  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 - She was evaluated by gynecological surgeon at [facility].  3.  Perirectal pain: - Continue oxycodone  10 mg once daily as needed.  She is not requiring daily.  4.  Peripheral neuropathy: - Constant tingling in the bottom of the feet and toes is stable since the start of oxaliplatin  on 03/25/2024.    No orders of the defined types were placed in this encounter.     Terri Mckinney,[name], MD.,have documented all relevant documentation on the behalf of Alean Stands, MD,as directed by  Alean Stands, MD while in the presence of Alean Stands, MD.  I, Alean Stands MD, have reviewed the above documentation for accuracy and  completeness, and I agree with the above.      Alean Stands, MD   8/7/20255:37 PM  CHIEF COMPLAINT:   Diagnosis: metastatic rectal cancer to the liver    Cancer Staging  Rectal cancer [facility]) Staging form: Colon and Rectum, AJCC 8th Edition - Clinical stage from 03/28/2022: Stage IVA (cTX, cN1, cM1a) - Unsigned    Prior Therapy: 1. FOLFOXIRI and bevacizumab , 13 cycles, 05/29/22 - 11/15/22  2. Xeloda  with radiation therapy, 12/11/22 - 01/18/23 3. LAR with diverting ileostomy and TAH w/ BSO, 04/04/23  Current Therapy: FOLFOX   HISTORY OF PRESENT ILLNESS:   Oncology History  Rectal cancer (HCC)  03/28/2022 Initial Diagnosis   Rectal cancer (HCC)   05/29/2022 - 07/26/2022 Chemotherapy   Patient is on Treatment Plan : COLORECTAL FOLFOXIRI + Bevacizumab  q14d     05/29/2022 - 11/17/2022 Chemotherapy   Patient is on Treatment Plan : COLORECTAL FOLFOXIRI + Bevacizumab  q14d      Genetic Testing   No pathogenic variants identified on the [employer] CustomNext+RNA panel. The report date is 09/08/2022.  The CustomNext-Cancer+RNAinsight panel offered by [facility] includes sequencing and rearrangement analysis for the following 47 genes:  APC, ATM, AXIN2, BARD1, BMPR1A, BRCA1, BRCA2, BRIP1, CDH1, CDK4, CDKN2A, CHEK2, DICER1, EPCAM, GREM1, HOXB13, MEN1, MLH1, MSH2, MSH3, MSH6, MUTYH, NBN, NF1, NF2, NTHL1, PALB2, PMS2, POLD1,  POLE, PTEN, RAD51C, RAD51D, RECQL, RET, SDHA, SDHAF2, SDHB, SDHC, SDHD, SMAD4, SMARCA4, STK11, TP53, TSC1, TSC2, and VHL.  RNA data is routinely analyzed for use in variant interpretation for all genes.   07/16/2023 - 03/13/2024 Chemotherapy   Patient is on Treatment Plan : COLORECTAL FOLFIRI + Bevacizumab  q14d     03/25/2024 -  Chemotherapy   Patient is on Treatment Plan : COLORECTAL FOLFOX q14d        INTERVAL HISTORY:   Terri Mckinney is a 42 y.o. female presenting to clinic today for follow up of metastatic rectal cancer to the liver. She was last seen by me on  06/10/2024.  Today, she states that she is doing well overall. Her appetite level is at 100%. Her energy level is at 75%. Terri Mckinney has been called by radiation, though she has not scheduled an appointment with them. She does not wish to proceed with arterial pump treatment at [facility].   She denies any new onset pains. Terri Mckinney reports right interior ankle swelling with associated clear discharge in the area. She denies any blistering of the skin or history of blood clots to the right lower extremity. She does have a history of varicose veins to the right lower extremity.   She reports constant numbness and tingling in the feet. Terri Mckinney denies any neuropathic pain to the feet and any upper extremity neuropathy. She notes intermittent tingling in the first 3 digits of the right hand, noticeable when driving.   PAST MEDICAL HISTORY:   Past Medical History: Past Medical History:  Diagnosis Date   Cancer [facility])    colorectal cancer   Neuromuscular disorder (HCC)    neuropathy in hands and feet from chemo   Port-A-Cath in place 05/24/2022   upper Rt chest   Tobacco abuse     Surgical History: Past Surgical History:  Procedure Laterality Date   BIOPSY  03/21/2022   Procedure: BIOPSY;  Surgeon: Cindie Carlin POUR, DO;  [facility];  Service: Endoscopy;;   BIOPSY  01/22/2024   Procedure: BIOPSY;  Surgeon: Cindie Carlin POUR, DO;  [facility];  Service: Endoscopy;;   COLONOSCOPY WITH PROPOFOL  N/A 03/21/2022   Procedure: COLONOSCOPY WITH PROPOFOL ;  Surgeon: Cindie Carlin POUR, DO;  [facility];  Service: Endoscopy;  Laterality: N/A;  8:30am   COLONOSCOPY WITH PROPOFOL  N/A 01/22/2024   Procedure: COLONOSCOPY WITH PROPOFOL ;  Surgeon: Cindie Carlin POUR, DO;  [facility];  Service: Endoscopy;  Laterality: N/A;  215pm, asa 2   FLEXIBLE SIGMOIDOSCOPY N/A 05/19/2022   Procedure: FLEXIBLE SIGMOIDOSCOPY WITH TATTOO INJECTION;  Surgeon: Debby Hila, MD;  Location:  [facility];  Service: General;  Laterality: N/A;   ILEOSTOMY N/A 04/04/2023   Procedure: DIVERTING ILEOSTOMY;  Surgeon: Debby Hila, MD;  Location: [facility];  Service: General;  Laterality: N/A;   ILEOSTOMY CLOSURE N/A 11/07/2023   Procedure: DIVERTING LOOP ILEOSTOMY TAKEDOWN;  Surgeon: Debby Hila, MD;  Location: [facility];  Service: General;  Laterality: N/A;   LAPAROSCOPIC DIVERTED COLOSTOMY N/A 05/19/2022   Procedure: LAPAROSCOPIC DIVERTED OSTOMY;  Surgeon: Debby Hila, MD;  Location: [facility];  Service: General;  Laterality: N/A;   PORTACATH PLACEMENT Right 05/19/2022   Procedure: INSERTION PORT-A-CATH;  Surgeon: Debby Hila, MD;  Location: [facility];  Service: General;  Laterality: Right;   ROBOTIC ASSISTED TOTAL HYSTERECTOMY WITH BILATERAL SALPINGO OOPHERECTOMY N/A 04/04/2023   Procedure: XI ROBOTIC ASSISTED TOTAL HYSTERECTOMY WITH BILATERAL SALPINGO OOPHORECTOMY;  Surgeon: Viktoria Comer SAUNDERS, MD;  Location:  [facility];  Service: Gynecology;  Laterality: N/A;   XI ROBOTIC ASSISTED LOWER ANTERIOR RESECTION N/A 04/04/2023   Procedure: XI ROBOTIC ASSISTED LOWER ANTERIOR RESECTION;  Surgeon: Debby Hila, MD;  Location: [facility];  Service: General;  Laterality: N/A;    Social History: Social History   Socioeconomic History   Marital status: Married    Spouse name: Not on file   Number of children: Not on file   Years of education: Not on file   Highest education level: Not on file  Occupational History   Not on file  Tobacco Use   Smoking status: Every Day    Current packs/day: 0.25    Average packs/day: 0.3 packs/day for 27.0 years (6.8 ttl pk-yrs)    Types: Cigarettes    Passive exposure: Current   Smokeless tobacco: Never  Vaping Use   Vaping status: Never Used  Substance and Sexual Activity   Alcohol use: No   Drug use: No   Sexual activity: Yes    Birth control/protection: Injection  Other Topics Concern   Not on file  Social History Narrative   Not on file   Social Drivers of  Health   Financial Resource Strain: Not on file  Food Insecurity: No Food Insecurity (11/08/2023)   Hunger Vital Sign    Worried About Running Out of Food in the Last Year: Never true    Ran Out of Food in the Last Year: Never true  Transportation Needs: No Transportation Needs (11/08/2023)   PRAPARE - Administrator, Civil Service (Medical): No    Lack of Transportation (Non-Medical): No  Physical Activity: Not on file  Stress: Not on file  Social Connections: Not on file  Intimate Partner Violence: Not At Risk (11/08/2023)   Humiliation, Afraid, Rape, and Kick questionnaire    Fear of Current or Ex-Partner: No    Emotionally Abused: No    Physically Abused: No    Sexually Abused: No    Family History: Family History  Problem Relation Age of Onset   Heart disease Mother    Diabetes Mother    Hearing loss Father    Heart disease Father    Hyperlipidemia Father    Hypertension Father    Stroke Father    Arthritis Father    Diabetes Father    Learning disabilities Brother    Diabetes Maternal Grandmother    Heart disease Maternal Grandmother    Diabetes Maternal Grandfather    Diabetes Paternal Grandmother    Diabetes Paternal Grandfather    Leukemia Maternal Uncle        d. 22s   Colon cancer Paternal Aunt        dx 85s   Breast cancer Cousin 33   Lung cancer Maternal Aunt     Current Medications:  Current Outpatient Medications:    estradiol  (ESTRACE  VAGINAL) 0.1 MG/GM vaginal cream, 1 week after surgery, begin to use cream nightly at bedtime for 2 weeks then 3 times a week after to assist with healing vagina. Use a fingertip size amount of cream on finger and place slightly past vaginal entrance. Do not use the applicator that comes with cream., Disp: 42.5 g, Rfl: 3   loperamide  (IMODIUM ) 2 MG capsule, Take 1 capsule (2 mg total) by mouth 4 (four) times daily as needed for diarrhea or loose stools., Disp: 12 capsule, Rfl: 0   Multiple Vitamin  (MULTIVITAMIN) tablet, Take 1 tablet by mouth daily., Disp: , Rfl:  nicotine (NICODERM CQ - DOSED IN MG/24 HOURS) 21 mg/24hr patch, Place 1 patch onto the skin., Disp: , Rfl:    nystatin ointment (MYCOSTATIN), Apply topically daily., Disp: , Rfl:    oxyCODONE  (ROXICODONE ) 5 MG immediate release tablet, Take 1 tablet (5 mg total) by mouth every 6 (six) hours as needed for severe pain (pain score 7-10)., Disp: 60 tablet, Rfl: 0   prochlorperazine  (COMPAZINE ) 10 MG tablet, Take 1 tablet (10 mg total) by mouth every 6 (six) hours as needed (Nausea or vomiting)., Disp: 30 tablet, Rfl: 1   Allergies: Allergies  Allergen Reactions   Wellbutrin  [Bupropion ]     insomnia   Tramadol  Rash    Mainly in chest and neck area    REVIEW OF SYSTEMS:   Review of Systems  Constitutional:  Positive for fatigue. Negative for chills and fever.  HENT:   Negative for lump/mass, mouth sores, nosebleeds, sore throat and trouble swallowing.   Eyes:  Negative for eye problems.  Respiratory:  Negative for cough and shortness of breath.   Cardiovascular:  Positive for leg swelling. Negative for chest pain and palpitations.  Gastrointestinal:  Positive for diarrhea and rectal pain (7/10 severity). Negative for abdominal pain, constipation, nausea and vomiting.  Genitourinary:  Negative for bladder incontinence, difficulty urinating, dysuria, frequency, hematuria and nocturia.   Musculoskeletal:  Negative for arthralgias, back pain, flank pain, myalgias and neck pain.  Skin:  Negative for itching and rash.  Neurological:  Positive for numbness (in feet). Negative for dizziness and headaches.  Hematological:  Does not bruise/bleed easily.  Psychiatric/Behavioral:  Negative for depression, sleep disturbance and suicidal ideas. The patient is not nervous/anxious.   All other systems reviewed and are negative.    VITALS:   Last menstrual period 04/19/2022.  Wt Readings from Last 3 Encounters:  07/08/24 167 lb 9.6  oz (76 kg)  07/08/24 167 lb 9.6 oz (76 kg)  06/24/24 165 lb 6.4 oz (75 kg)    There is no height or weight on file to calculate BMI.  Performance status (ECOG): 0 - Asymptomatic  PHYSICAL EXAM:   Physical Exam Vitals and nursing note reviewed. Exam conducted with a chaperone present.  Constitutional:      Appearance: Normal appearance.  Cardiovascular:     Rate and Rhythm: Normal rate and regular rhythm.     Pulses: Normal pulses.     Heart sounds: Normal heart sounds.  Pulmonary:     Effort: Pulmonary effort is normal.     Breath sounds: Normal breath sounds.  Abdominal:     Palpations: Abdomen is soft. There is no hepatomegaly, splenomegaly or mass.     Tenderness: There is no abdominal tenderness.  Musculoskeletal:     Right lower leg: No edema.     Left lower leg: No edema.  Lymphadenopathy:     Cervical: No cervical adenopathy.     Right cervical: No superficial, deep or posterior cervical adenopathy.    Left cervical: No superficial, deep or posterior cervical adenopathy.     Upper Body:     Right upper body: No supraclavicular or axillary adenopathy.     Left upper body: No supraclavicular or axillary adenopathy.  Neurological:     General: No focal deficit present.     Mental Status: She is alert and oriented to person, place, and time.  Psychiatric:        Mood and Affect: Mood normal.        Behavior: Behavior normal.  LABS:   CBC     Component Value Date/Time   WBC 6.7 07/08/2024 0908   RBC 3.77 (L) 07/08/2024 0908   HGB 11.9 (L) 07/08/2024 0908   HGB 12.3 03/03/2022 1618   HCT 36.2 07/08/2024 0908   HCT 35.4 03/03/2022 1618   PLT 173 07/08/2024 0908   PLT 351 03/03/2022 1618   MCV 96.0 07/08/2024 0908   MCV 91 03/03/2022 1618   MCH 31.6 07/08/2024 0908   MCHC 32.9 07/08/2024 0908   RDW 16.9 (H) 07/08/2024 0908   RDW 12.1 03/03/2022 1618   LYMPHSABS 1.1 07/08/2024 0908   LYMPHSABS 3.7 (H) 03/03/2022 1618   MONOABS 0.6 07/08/2024 0908    EOSABS 0.7 (H) 07/08/2024 0908   EOSABS 0.9 (H) 03/03/2022 1618   BASOSABS 0.1 07/08/2024 0908   BASOSABS 0.1 03/03/2022 1618    CMP      Component Value Date/Time   NA 139 07/08/2024 0908   NA 144 03/03/2022 1618   K 3.9 07/08/2024 0908   CL 104 07/08/2024 0908   CO2 23 07/08/2024 0908   GLUCOSE 95 07/08/2024 0908   BUN 11 07/08/2024 0908   BUN 10 03/03/2022 1618   CREATININE 0.63 07/08/2024 0908   CALCIUM  9.3 07/08/2024 0908   PROT 6.6 07/08/2024 0908   PROT 5.8 (L) 03/03/2022 1618   ALBUMIN 3.4 (L) 07/08/2024 0908   ALBUMIN 4.0 03/03/2022 1618   AST 22 07/08/2024 0908   ALT 19 07/08/2024 0908   ALKPHOS 81 07/08/2024 0908   BILITOT 0.5 07/08/2024 0908   BILITOT <0.2 03/03/2022 1618   GFRNONAA >60 07/08/2024 0908   GFRAA 97 07/16/2020 1604     Lab Results  Component Value Date   CEA1 40.3 (H) 07/08/2024   /  CEA  Date Value Ref Range Status  07/08/2024 40.3 (H) 0.0 - 4.7 ng/mL Final    Comment:    (NOTE)                             Nonsmokers          <3.9                             Smokers             <5.6 [employer] Electrochemiluminescence Immunoassay (ECLIA) Values obtained with different assay methods or kits cannot be used interchangeably.  Results cannot be interpreted as absolute evidence of the presence or absence of malignant disease. Performed At: [facility] [address] [place], [place] [id number] Jennette Shorter MD Ey:[id number]    No results found for: PSA1 No results found for: CAN199 No results found for: CAN125  No results found for: TOTALPROTELP, ALBUMINELP, A1GS, A2GS, BETS, BETA2SER, GAMS, MSPIKE, SPEI No results found for: TIBC, FERRITIN, IRONPCTSAT No results found for: LDH   STUDIES:   No results found.

## 2024-07-08 NOTE — Progress Notes (Unsigned)
Patient has been assessed, vital signs and labs have been reviewed by Dr. Katragadda. ANC, Creatinine, LFTs, and Platelets are within treatment parameters per Dr. Katragadda. The patient is good to proceed with treatment at this time. Primary RN and pharmacy aware.  

## 2024-07-09 ENCOUNTER — Other Ambulatory Visit: Payer: Self-pay

## 2024-07-09 LAB — CEA: CEA: 40.3 ng/mL — ABNORMAL HIGH (ref 0.0–4.7)

## 2024-07-10 ENCOUNTER — Other Ambulatory Visit: Payer: Self-pay | Admitting: *Deleted

## 2024-07-10 ENCOUNTER — Encounter: Payer: Self-pay | Admitting: Hematology

## 2024-07-10 ENCOUNTER — Inpatient Hospital Stay

## 2024-07-10 MED ORDER — OXYCODONE HCL 5 MG PO TABS: 5.0000 mg | ORAL_TABLET | Freq: Four times a day (QID) | ORAL | 0 refills | Status: DC | PRN

## 2024-07-22 ENCOUNTER — Other Ambulatory Visit: Payer: Self-pay | Admitting: *Deleted

## 2024-07-22 ENCOUNTER — Other Ambulatory Visit: Payer: Self-pay | Admitting: Oncology

## 2024-07-22 ENCOUNTER — Inpatient Hospital Stay

## 2024-07-22 ENCOUNTER — Encounter: Payer: Self-pay | Admitting: Oncology

## 2024-07-22 VITALS — BP 114/62 | HR 81 | Temp 98.1°F | Resp 18 | Wt 170.2 lb

## 2024-07-22 VITALS — BP 119/69 | HR 82 | Temp 98.7°F | Resp 18

## 2024-07-22 DIAGNOSIS — C20 Malignant neoplasm of rectum: Secondary | ICD-10-CM

## 2024-07-22 DIAGNOSIS — Z95828 Presence of other vascular implants and grafts: Secondary | ICD-10-CM

## 2024-07-22 DIAGNOSIS — Z5111 Encounter for antineoplastic chemotherapy: Secondary | ICD-10-CM | POA: Diagnosis not present

## 2024-07-22 LAB — CBC WITH DIFFERENTIAL/PLATELET
Abs Immature Granulocytes: 0.02 K/uL (ref 0.00–0.07)
Basophils Absolute: 0.1 K/uL (ref 0.0–0.1)
Basophils Relative: 1 %
Eosinophils Absolute: 0.9 K/uL — ABNORMAL HIGH (ref 0.0–0.5)
Eosinophils Relative: 14 %
HCT: 36.4 % (ref 36.0–46.0)
Hemoglobin: 11.7 g/dL — ABNORMAL LOW (ref 12.0–15.0)
Immature Granulocytes: 0 %
Lymphocytes Relative: 16 %
Lymphs Abs: 1 K/uL (ref 0.7–4.0)
MCH: 31 pg (ref 26.0–34.0)
MCHC: 32.1 g/dL (ref 30.0–36.0)
MCV: 96.6 fL (ref 80.0–100.0)
Monocytes Absolute: 0.5 K/uL (ref 0.1–1.0)
Monocytes Relative: 9 %
Neutro Abs: 3.6 K/uL (ref 1.7–7.7)
Neutrophils Relative %: 60 %
Platelets: 185 K/uL (ref 150–400)
RBC: 3.77 MIL/uL — ABNORMAL LOW (ref 3.87–5.11)
RDW: 16.7 % — ABNORMAL HIGH (ref 11.5–15.5)
WBC: 6 K/uL (ref 4.0–10.5)
nRBC: 0 % (ref 0.0–0.2)

## 2024-07-22 LAB — MAGNESIUM: Magnesium: 2.1 mg/dL (ref 1.7–2.4)

## 2024-07-22 LAB — COMPREHENSIVE METABOLIC PANEL WITH GFR
ALT: 16 U/L (ref 0–44)
AST: 21 U/L (ref 15–41)
Albumin: 3.3 g/dL — ABNORMAL LOW (ref 3.5–5.0)
Alkaline Phosphatase: 77 U/L (ref 38–126)
Anion gap: 12 (ref 5–15)
BUN: 13 mg/dL (ref 6–20)
CO2: 23 mmol/L (ref 22–32)
Calcium: 9.2 mg/dL (ref 8.9–10.3)
Chloride: 104 mmol/L (ref 98–111)
Creatinine, Ser: 0.66 mg/dL (ref 0.44–1.00)
GFR, Estimated: >60 mL/min (ref >60)
Glucose, Bld: 112 mg/dL — ABNORMAL HIGH (ref 70–99)
Potassium: 3.9 mmol/L (ref 3.5–5.1)
Sodium: 139 mmol/L (ref 135–145)
Total Bilirubin: 0.6 mg/dL (ref 0.0–1.2)
Total Protein: 6.5 g/dL (ref 6.5–8.1)

## 2024-07-22 MED ORDER — FLUOROURACIL CHEMO INJECTION 2.5 GM/50ML
400.0000 mg/m2 | Freq: Once | INTRAVENOUS | Status: AC
  Administered 2024-07-22: 750 mg via INTRAVENOUS
  Filled 2024-07-22: qty 15

## 2024-07-22 MED ORDER — LEUCOVORIN CALCIUM INJECTION 350 MG
400.0000 mg/m2 | Freq: Once | INTRAVENOUS | Status: AC
  Administered 2024-07-22: 736 mg via INTRAVENOUS
  Filled 2024-07-22: qty 36.8

## 2024-07-22 MED ORDER — PALONOSETRON HCL INJECTION 0.25 MG/5ML
0.2500 mg | Freq: Once | INTRAVENOUS | Status: AC
  Administered 2024-07-22: 0.25 mg via INTRAVENOUS
  Filled 2024-07-22: qty 5

## 2024-07-22 MED ORDER — OXALIPLATIN CHEMO INJECTION 100 MG/20ML
65.0000 mg/m2 | Freq: Once | INTRAVENOUS | Status: AC
  Administered 2024-07-22: 120 mg via INTRAVENOUS
  Filled 2024-07-22: qty 20

## 2024-07-22 MED ORDER — SODIUM CHLORIDE 0.9 % IV SOLN
2400.0000 mg/m2 | INTRAVENOUS | Status: DC
  Administered 2024-07-22: 4400 mg via INTRAVENOUS
  Filled 2024-07-22: qty 88

## 2024-07-22 MED ORDER — DEXTROSE 5 % IV SOLN
INTRAVENOUS | Status: DC
Start: 2024-07-22 — End: 2024-07-23

## 2024-07-22 MED ORDER — DEXAMETHASONE SODIUM PHOSPHATE 10 MG/ML IJ SOLN
10.0000 mg | Freq: Once | INTRAMUSCULAR | Status: AC
  Administered 2024-07-22: 10 mg via INTRAVENOUS
  Filled 2024-07-22: qty 1

## 2024-07-22 NOTE — Progress Notes (Signed)
   07/22/24 1000  Spiritual Encounters  Type of Visit Follow up  Care provided to: Patient  Referral source Chaplain assessment  Reason for visit Routine spiritual support  Spiritual Framework  Presenting Themes Significant life change;Caregiving needs;Coping tools;Community and relationships  Community/Connection Family;Faith community;Friend(s)  Patient Stress Factors Exhausted  Interventions  Spiritual Care Interventions Made Compassionate presence;Reflective listening;Narrative/life review;Encouragement   Reason for Visit: Chaplain making scheduled follow-up with Pt with whomI recently connected  Description of Visit: Upon entering the room Lorena was sitting in the recliner receiving treatment.  She looks a little tired.  As part of relationship building and support I asked her how the home renovation was going because in our previous visit she had spent some time talking about the work her and her sister were doing together.  Both Dietra and her sister are in cancer treatment and they have bonded over this and are using the renovations as a coping strategy.  Marysa replied to my inquiry telling me that the renovations have been put on hold for the moment as her sister suffered a mini-stroke last week and is recovering.  Cedrica went on to describe how her sisters and brother were all coming closer together to support and comfort one another.  She describes her daddy's final wish as wanting to see all of his children come into relationship with the Lord and return to church.  Cherilyn reports that wish is nearly complete now. We practiced meaning making and celebrated how good things are coming out of her health crisis, and she is pleased with what the Jacquetta is doing.  In further exploration with Larcenia, she reports feeling recently fatigued and tired and needing to take naps to get through the day.  Plan of Care: I will continue to follow Daisa for support on a monthly  basis.   Maude Roll, MDiv  Chaplain, [facility] [email or web address] [phone]

## 2024-07-22 NOTE — Progress Notes (Unsigned)
 Patient presents today for Folfox infusion.  Patient is in satisfactory condition with no new complaints voiced.  Vital signs are stable.  Labs reviewed and all labs are within treatment parameters.  We will proceed with treatment per MD orders.    Treatment given today per MD orders. Tolerated infusion without adverse affects. Vital signs stable. No complaints at this time. Discharged from clinic ambulatory in stable condition. Alert and oriented x 3. F/U with [facility] as scheduled.  5FU ambulatory pump infusing with no alarms beeping.

## 2024-07-23 ENCOUNTER — Encounter: Payer: Self-pay | Admitting: Oncology

## 2024-07-23 LAB — CEA: CEA: 56.5 ng/mL — ABNORMAL HIGH (ref 0.0–4.7)

## 2024-07-23 NOTE — Patient Instructions (Signed)
 [facility] [place] - A DEPT OF [facility]  Discharge Instructions: Thank you for choosing [facility] to provide your oncology and hematology care.  If you have a lab appointment with the Cancer Center - please note that after April 8th, 2024, all labs will be drawn in the cancer center.  You do not have to check in or register with the main entrance as you have in the past but will complete your check-in in the cancer center.  Wear comfortable clothing and clothing appropriate for easy access to any Portacath or PICC line.   We strive to give you quality time with your provider. You may need to reschedule your appointment if you arrive late (15 or more minutes).  Arriving late affects you and other patients whose appointments are after yours.  Also, if you miss three or more appointments without notifying the office, you may be dismissed from the clinic at the provider's discretion.      For prescription refill requests, have your pharmacy contact our office and allow 72 hours for refills to be completed.    Today you received the following chemotherapy and/or immunotherapy agents Folfox   To help prevent nausea and vomiting after your treatment, we encourage you to take your nausea medication as directed.  BELOW ARE SYMPTOMS THAT SHOULD BE REPORTED IMMEDIATELY: *FEVER GREATER THAN 100.4 F (38 C) OR HIGHER *CHILLS OR SWEATING *NAUSEA AND VOMITING THAT IS NOT CONTROLLED WITH YOUR NAUSEA MEDICATION *UNUSUAL SHORTNESS OF BREATH *UNUSUAL BRUISING OR BLEEDING *URINARY PROBLEMS (pain or burning when urinating, or frequent urination) *BOWEL PROBLEMS (unusual diarrhea, constipation, pain near the anus) TENDERNESS IN MOUTH AND THROAT WITH OR WITHOUT PRESENCE OF ULCERS (sore throat, sores in mouth, or a toothache) UNUSUAL RASH, SWELLING OR PAIN  UNUSUAL VAGINAL DISCHARGE OR ITCHING   Items with * indicate a potential emergency and should be followed up as  soon as possible or go to the Emergency Department if any problems should occur.  Please show the CHEMOTHERAPY ALERT CARD or IMMUNOTHERAPY ALERT CARD at check-in to the Emergency Department and triage nurse.  Should you have questions after your visit or need to cancel or reschedule your appointment, please contact [facility] [place] - A DEPT OF [facility] [phone]  and follow the prompts.  Office hours are 8:00 a.m. to 4:30 p.m. Monday - Friday. Please note that voicemails left after 4:00 p.m. may not be returned until the following business day.  We are closed weekends and major holidays. You have access to a nurse at all times for urgent questions. Please call the main number to the clinic [phone] and follow the prompts.  For any non-urgent questions, you may also contact your provider using MyChart. We now offer e-Visits for anyone 35 and older to request care online for non-urgent symptoms. For details visit [email or web address].   Also download the MyChart app! Go to the app store, search "MyChart", open the app, select [place], and log in with your MyChart username and password.

## 2024-07-24 ENCOUNTER — Inpatient Hospital Stay

## 2024-07-24 VITALS — BP 117/72 | HR 102 | Temp 98.3°F | Resp 18

## 2024-07-24 DIAGNOSIS — Z5111 Encounter for antineoplastic chemotherapy: Secondary | ICD-10-CM | POA: Diagnosis not present

## 2024-07-24 DIAGNOSIS — Z95828 Presence of other vascular implants and grafts: Secondary | ICD-10-CM

## 2024-07-24 DIAGNOSIS — C20 Malignant neoplasm of rectum: Secondary | ICD-10-CM

## 2024-07-24 NOTE — Progress Notes (Signed)
Chemo pump disconnected. Port flushed with good blood return noted. No bruising or swelling at site. Bandaid applied and patient discharged in satisfactory condition. VVS stable with no signs or symptoms of distressed noted. 

## 2024-07-24 NOTE — Patient Instructions (Signed)
 [facility] [place] - A DEPT OF [place]  Discharge Instructions: Thank you for choosing [facility] to provide your oncology and hematology care.  If you have a lab appointment with the Cancer Center - please note that after April 8th, 2024, all labs will be drawn in the cancer center.  You do not have to check in or register with the main entrance as you have in the past but will complete your check-in in the cancer center.  Wear comfortable clothing and clothing appropriate for easy access to any Portacath or PICC line.   We strive to give you quality time with your provider. You may need to reschedule your appointment if you arrive late (15 or more minutes).  Arriving late affects you and other patients whose appointments are after yours.  Also, if you miss three or more appointments without notifying the office, you may be dismissed from the clinic at the provider's discretion.      For prescription refill requests, have your pharmacy contact our office and allow 72 hours for refills to be completed.    Today you received the following chemo pump disconected.    To help prevent nausea and vomiting after your treatment, we encourage you to take your nausea medication as directed.  BELOW ARE SYMPTOMS THAT SHOULD BE REPORTED IMMEDIATELY: *FEVER GREATER THAN 100.4 F (38 C) OR HIGHER *CHILLS OR SWEATING *NAUSEA AND VOMITING THAT IS NOT CONTROLLED WITH YOUR NAUSEA MEDICATION *UNUSUAL SHORTNESS OF BREATH *UNUSUAL BRUISING OR BLEEDING *URINARY PROBLEMS (pain or burning when urinating, or frequent urination) *BOWEL PROBLEMS (unusual diarrhea, constipation, pain near the anus) TENDERNESS IN MOUTH AND THROAT WITH OR WITHOUT PRESENCE OF ULCERS (sore throat, sores in mouth, or a toothache) UNUSUAL RASH, SWELLING OR PAIN  UNUSUAL VAGINAL DISCHARGE OR ITCHING   Items with * indicate a potential emergency and should be followed up as soon as possible or go to  the Emergency Department if any problems should occur.  Please show the CHEMOTHERAPY ALERT CARD or IMMUNOTHERAPY ALERT CARD at check-in to the Emergency Department and triage nurse.  Should you have questions after your visit or need to cancel or reschedule your appointment, please contact [facility] [place] - A DEPT OF [facility] [phone]  and follow the prompts.  Office hours are 8:00 a.m. to 4:30 p.m. Monday - Friday. Please note that voicemails left after 4:00 p.m. may not be returned until the following business day.  We are closed weekends and major holidays. You have access to a nurse at all times for urgent questions. Please call the main number to the clinic [phone] and follow the prompts.  For any non-urgent questions, you may also contact your provider using MyChart. We now offer e-Visits for anyone 33 and older to request care online for non-urgent symptoms. For details visit [email or web address].   Also download the MyChart app! Go to the app store, search MyChart, open the app, select [place], and log in with your MyChart username and password.

## 2024-08-05 ENCOUNTER — Inpatient Hospital Stay: Attending: Hematology

## 2024-08-05 ENCOUNTER — Inpatient Hospital Stay

## 2024-08-05 ENCOUNTER — Encounter: Payer: Self-pay | Admitting: Oncology

## 2024-08-05 VITALS — BP 103/64 | HR 70 | Temp 97.6°F | Resp 18

## 2024-08-05 DIAGNOSIS — G622 Polyneuropathy due to other toxic agents: Secondary | ICD-10-CM | POA: Insufficient documentation

## 2024-08-05 DIAGNOSIS — C20 Malignant neoplasm of rectum: Secondary | ICD-10-CM | POA: Diagnosis not present

## 2024-08-05 DIAGNOSIS — D6481 Anemia due to antineoplastic chemotherapy: Secondary | ICD-10-CM | POA: Diagnosis not present

## 2024-08-05 DIAGNOSIS — C7800 Secondary malignant neoplasm of unspecified lung: Secondary | ICD-10-CM | POA: Insufficient documentation

## 2024-08-05 DIAGNOSIS — C787 Secondary malignant neoplasm of liver and intrahepatic bile duct: Secondary | ICD-10-CM | POA: Diagnosis not present

## 2024-08-05 DIAGNOSIS — Z79899 Other long term (current) drug therapy: Secondary | ICD-10-CM | POA: Diagnosis not present

## 2024-08-05 DIAGNOSIS — Z95828 Presence of other vascular implants and grafts: Secondary | ICD-10-CM

## 2024-08-05 DIAGNOSIS — N823 Fistula of vagina to large intestine: Secondary | ICD-10-CM | POA: Insufficient documentation

## 2024-08-05 DIAGNOSIS — Z5111 Encounter for antineoplastic chemotherapy: Secondary | ICD-10-CM | POA: Diagnosis present

## 2024-08-05 LAB — MAGNESIUM: Magnesium: 2.1 mg/dL (ref 1.7–2.4)

## 2024-08-05 LAB — CBC WITH DIFFERENTIAL/PLATELET
Abs Immature Granulocytes: 0.02 K/uL (ref 0.00–0.07)
Basophils Absolute: 0.1 K/uL (ref 0.0–0.1)
Basophils Relative: 1 %
Eosinophils Absolute: 0.4 K/uL (ref 0.0–0.5)
Eosinophils Relative: 8 %
HCT: 39 % (ref 36.0–46.0)
Hemoglobin: 12.6 g/dL (ref 12.0–15.0)
Immature Granulocytes: 0 %
Lymphocytes Relative: 14 %
Lymphs Abs: 0.8 K/uL (ref 0.7–4.0)
MCH: 31 pg (ref 26.0–34.0)
MCHC: 32.3 g/dL (ref 30.0–36.0)
MCV: 96.1 fL (ref 80.0–100.0)
Monocytes Absolute: 0.8 K/uL (ref 0.1–1.0)
Monocytes Relative: 14 %
Neutro Abs: 3.6 K/uL (ref 1.7–7.7)
Neutrophils Relative %: 63 %
Platelets: 145 K/uL — ABNORMAL LOW (ref 150–400)
RBC: 4.06 MIL/uL (ref 3.87–5.11)
RDW: 16.5 % — ABNORMAL HIGH (ref 11.5–15.5)
WBC: 5.6 K/uL (ref 4.0–10.5)
nRBC: 0 % (ref 0.0–0.2)

## 2024-08-05 LAB — COMPREHENSIVE METABOLIC PANEL WITH GFR
ALT: 21 U/L (ref 0–44)
AST: 23 U/L (ref 15–41)
Albumin: 3.4 g/dL — ABNORMAL LOW (ref 3.5–5.0)
Alkaline Phosphatase: 83 U/L (ref 38–126)
Anion gap: 12 (ref 5–15)
BUN: 13 mg/dL (ref 6–20)
CO2: 20 mmol/L — ABNORMAL LOW (ref 22–32)
Calcium: 8.9 mg/dL (ref 8.9–10.3)
Chloride: 106 mmol/L (ref 98–111)
Creatinine, Ser: 0.76 mg/dL (ref 0.44–1.00)
GFR, Estimated: >60 mL/min (ref >60)
Glucose, Bld: 95 mg/dL (ref 70–99)
Potassium: 4.4 mmol/L (ref 3.5–5.1)
Sodium: 138 mmol/L (ref 135–145)
Total Bilirubin: 0.4 mg/dL (ref 0.0–1.2)
Total Protein: 6.6 g/dL (ref 6.5–8.1)

## 2024-08-05 MED ORDER — DEXTROSE 5 % IV SOLN: INTRAVENOUS | Status: DC

## 2024-08-05 MED ORDER — LEUCOVORIN CALCIUM INJECTION 350 MG
400.0000 mg/m2 | Freq: Once | INTRAVENOUS | Status: AC
  Administered 2024-08-05: 736 mg via INTRAVENOUS
  Filled 2024-08-05: qty 36.8

## 2024-08-05 MED ORDER — DEXAMETHASONE SODIUM PHOSPHATE 10 MG/ML IJ SOLN
10.0000 mg | Freq: Once | INTRAMUSCULAR | Status: AC
  Administered 2024-08-05: 10 mg via INTRAVENOUS
  Filled 2024-08-05: qty 1

## 2024-08-05 MED ORDER — FLUOROURACIL CHEMO INJECTION 2.5 GM/50ML
400.0000 mg/m2 | Freq: Once | INTRAVENOUS | Status: AC
  Administered 2024-08-05: 750 mg via INTRAVENOUS
  Filled 2024-08-05: qty 15

## 2024-08-05 MED ORDER — OXALIPLATIN CHEMO INJECTION 100 MG/20ML
65.0000 mg/m2 | Freq: Once | INTRAVENOUS | Status: AC
  Administered 2024-08-05: 120 mg via INTRAVENOUS
  Filled 2024-08-05: qty 20

## 2024-08-05 MED ORDER — PALONOSETRON HCL INJECTION 0.25 MG/5ML
0.2500 mg | Freq: Once | INTRAVENOUS | Status: AC
  Administered 2024-08-05: 0.25 mg via INTRAVENOUS
  Filled 2024-08-05: qty 5

## 2024-08-05 MED ORDER — SODIUM CHLORIDE 0.9 % IV SOLN
2400.0000 mg/m2 | INTRAVENOUS | Status: DC
  Administered 2024-08-05: 4400 mg via INTRAVENOUS
  Filled 2024-08-05: qty 88

## 2024-08-05 NOTE — Progress Notes (Signed)
 Patient tolerated chemotherapy with no complaints voiced.  Side effects with management reviewed with understanding verbalized.  Port site clean and dry with no bruising or swelling noted at site.  Good blood return noted before and after administration of chemotherapy.  Port dressing intact.  Patient left in satisfactory condition with VSS and no s/s of distress noted.

## 2024-08-05 NOTE — Patient Instructions (Signed)
 [facility] [place] - [facility] DEPT [facility]  Discharge Instructions: Thank you for choosing [facility] to provide your oncology and hematology care.  If you have a lab appointment with the Cancer Center - please note that after April 8th, 2024, all labs will be drawn in the cancer center.  You do not have to check in or register with the main entrance as you have in the past but will complete your check-in in the cancer center.  Wear comfortable clothing and clothing appropriate for easy access to any Portacath or PICC line.   We strive to give you quality time with your provider. You may need to reschedule your appointment if you arrive late (15 or more minutes).  Arriving late affects you and other patients whose appointments are after yours.  Also, if you miss three or more appointments without notifying the office, you may be dismissed from the clinic at the provider's discretion.      For prescription refill requests, have your pharmacy contact our office and allow 72 hours for refills to be completed.    Today you received the following chemotherapy and/or immunotherapy agents oxaliplatin and adruicil       To help prevent nausea and vomiting after your treatment, we encourage you to take your nausea medication as directed.  BELOW ARE SYMPTOMS THAT SHOULD BE REPORTED IMMEDIATELY: *FEVER GREATER THAN 100.4 F (38 C) OR HIGHER *CHILLS OR SWEATING *NAUSEA AND VOMITING THAT IS NOT CONTROLLED WITH YOUR NAUSEA MEDICATION *UNUSUAL SHORTNESS OF BREATH *UNUSUAL BRUISING OR BLEEDING *URINARY PROBLEMS (pain or burning when urinating, or frequent urination) *BOWEL PROBLEMS (unusual diarrhea, constipation, pain near the anus) TENDERNESS IN MOUTH AND THROAT WITH OR WITHOUT PRESENCE OF ULCERS (sore throat, sores in mouth, or a toothache) UNUSUAL RASH, SWELLING OR PAIN  UNUSUAL VAGINAL DISCHARGE OR ITCHING   Items with * indicate a potential emergency and  should be followed up as soon as possible or go to the Emergency Department if any problems should occur.  Please show the CHEMOTHERAPY ALERT CARD or IMMUNOTHERAPY ALERT CARD at check-in to the Emergency Department and triage nurse.  Should you have questions after your visit or need to cancel or reschedule your appointment, please contact [facility] [place] - A DEPT OF [facility]igha Bridegroom [facility] [phone]  and follow the prompts.  Office hours are 8:00 a.m. to 4:30 p.m. Monday - Friday. Please note that voicemails left after 4:00 p.m. may not be returned until the following business day.  We are closed weekends and major holidays. You have access to a nurse at all times for urgent questions. Please call the main number to the clinic [phone] and follow the prompts.  For any non-urgent questions, you may also contact your provider using MyChart. We now offer e-Visits for anyone 55 and older to request care online for non-urgent symptoms. For details visit [email or web address].   Also download the MyChart app! Go to the app store, search "MyChart", open the app, select [place], and log in with your MyChart username and password.

## 2024-08-06 LAB — CEA: CEA: 63 ng/mL — ABNORMAL HIGH (ref 0.0–4.7)

## 2024-08-07 ENCOUNTER — Inpatient Hospital Stay

## 2024-08-07 VITALS — BP 125/76 | HR 101 | Temp 97.6°F | Resp 18

## 2024-08-07 DIAGNOSIS — Z5111 Encounter for antineoplastic chemotherapy: Secondary | ICD-10-CM | POA: Diagnosis not present

## 2024-08-07 DIAGNOSIS — C20 Malignant neoplasm of rectum: Secondary | ICD-10-CM

## 2024-08-07 DIAGNOSIS — Z95828 Presence of other vascular implants and grafts: Secondary | ICD-10-CM

## 2024-08-07 NOTE — Progress Notes (Signed)
 Patient presents today for pump d/c. Vital signs are stable. Port a cath site clean, dry, and intact. Port flushed with 20 mls of Normal Saline. Needle removed intact. Band aid applied. Patient has no complaints at this time. Discharged from clinic ambulatory and in stable condition. Patient alert and oriented.

## 2024-08-07 NOTE — Patient Instructions (Signed)
 [facility] [place] [facility]  Discharge Instructions: Thank you for choosing [facility] to provide your oncology and hematology care.  If you have a lab appointment with the Cancer Center - please note that after April 8th, 2024, all labs will be drawn in the cancer center.  You do not have to check in or register with the main entrance as you have in the past but will complete your check-in in the cancer center.  Wear comfortable clothing and clothing appropriate for easy access to any Portacath or PICC line.   We strive to give you quality time with your provider. You may need to reschedule your appointment if you arrive late (15 or more minutes).  Arriving late affects you and other patients whose appointments are after yours.  Also, if you miss three or more appointments without notifying the office, you may be dismissed from the clinic at the provider's discretion.      For prescription refill requests, have your pharmacy contact our office and allow 72 hours for refills to be completed.    Today you received the following chemotherapy and/or immunotherapy agents Adrucil. Fluorouracil Injection What is this medication? FLUOROURACIL (flure oh YOOR a sil) treats some types of cancer. It works by slowing down the growth of cancer cells. This medicine may be used for other purposes; ask your health care provider or pharmacist if you have questions. COMMON BRAND NAME(S): Adrucil What should I tell my care team before I take this medication? They need to know if you have any of these conditions: Blood disorders Dihydropyrimidine dehydrogenase (DPD) deficiency Infection, such as chickenpox, cold sores, herpes Kidney disease Liver disease Poor nutrition Recent or ongoing radiation therapy An unusual or allergic reaction to fluorouracil, other medications, foods, dyes, or preservatives If you or your partner are pregnant or trying to get  pregnant Breast-feeding How should I use this medication? This medication is injected into a vein. It is administered by your care team in a hospital or clinic setting. Talk to your care team about the use of this medication in children. Special care may be needed. Overdosage: If you think you have taken too much of this medicine contact a poison control center or emergency room at once. NOTE: This medicine is only for you. Do not share this medicine with others. What if I miss a dose? Keep appointments for follow-up doses. It is important not to miss your dose. Call your care team if you are unable to keep an appointment. What may interact with this medication? Do not take this medication with any of the following: Live virus vaccines This medication may also interact with the following: Medications that treat or prevent blood clots, such as warfarin, enoxaparin, dalteparin This list may not describe all possible interactions. Give your health care provider a list of all the medicines, herbs, non-prescription drugs, or dietary supplements you use. Also tell them if you smoke, drink alcohol, or use illegal drugs. Some items may interact with your medicine. What should I watch for while using this medication? Your condition will be monitored carefully while you are receiving this medication. This medication may make you feel generally unwell. This is not uncommon as chemotherapy can affect healthy cells as well as cancer cells. Report any side effects. Continue your course of treatment even though you feel ill unless your care team tells you to stop. In some cases, you may be given additional  medications to help with side effects. Follow all directions for their use. This medication may increase your risk of getting an infection. Call your care team for advice if you get a fever, chills, sore throat, or other symptoms of a cold or flu. Do not treat yourself. Try to avoid being around people who are  sick. This medication may increase your risk to bruise or bleed. Call your care team if you notice any unusual bleeding. Be careful brushing or flossing your teeth or using a toothpick because you may get an infection or bleed more easily. If you have any dental work done, tell your dentist you are receiving this medication. Avoid taking medications that contain aspirin, acetaminophen, ibuprofen, naproxen, or ketoprofen unless instructed by your care team. These medications may hide a fever. Do not treat diarrhea with over the counter products. Contact your care team if you have diarrhea that lasts more than 2 days or if it is severe and watery. This medication can make you more sensitive to the sun. Keep out of the sun. If you cannot avoid being in the sun, wear protective clothing and sunscreen. Do not use sun lamps, tanning beds, or tanning booths. Talk to your care team if you or your partner wish to become pregnant or think you might be pregnant. This medication can cause serious birth defects if taken during pregnancy and for 3 months after the last dose. A reliable form of contraception is recommended while taking this medication and for 3 months after the last dose. Talk to your care team about effective forms of contraception. Do not father a child while taking this medication and for 3 months after the last dose. Use a condom while having sex during this time period. Do not breastfeed while taking this medication. This medication may cause infertility. Talk to your care team if you are concerned about your fertility. What side effects may I notice from receiving this medication? Side effects that you should report to your care team as soon as possible: Allergic reactions--skin rash, itching, hives, swelling of the face, lips, tongue, or throat Heart attack--pain or tightness in the chest, shoulders, arms, or jaw, nausea, shortness of breath, cold or clammy skin, feeling faint or  lightheaded Heart failure--shortness of breath, swelling of the ankles, feet, or hands, sudden weight gain, unusual weakness or fatigue Heart rhythm changes--fast or irregular heartbeat, dizziness, feeling faint or lightheaded, chest pain, trouble breathing High ammonia level--unusual weakness or fatigue, confusion, loss of appetite, nausea, vomiting, seizures Infection--fever, chills, cough, sore throat, wounds that don't heal, pain or trouble when passing urine, general feeling of discomfort or being unwell Low red blood cell level--unusual weakness or fatigue, dizziness, headache, trouble breathing Pain, tingling, or numbness in the hands or feet, muscle weakness, change in vision, confusion or trouble speaking, loss of balance or coordination, trouble walking, seizures Redness, swelling, and blistering of the skin over hands and feet Severe or prolonged diarrhea Unusual bruising or bleeding Side effects that usually do not require medical attention (report to your care team if they continue or are bothersome): Dry skin Headache Increased tears Nausea Pain, redness, or swelling with sores inside the mouth or throat Sensitivity to light Vomiting This list may not describe all possible side effects. Call your doctor for medical advice about side effects. You may report side effects to FDA at 1-800-FDA-1088. Where should I keep my medication? This medication is given in a hospital or clinic. It will not be stored at home.  NOTE: This sheet is a summary. It may not cover all possible information. If you have questions about this medicine, talk to your doctor, pharmacist, or health care provider.  2024 Elsevier/Gold Standard (2022-03-28 00:00:00)      To help prevent nausea and vomiting after your treatment, we encourage you to take your nausea medication as directed.  BELOW ARE SYMPTOMS THAT SHOULD BE REPORTED IMMEDIATELY: *FEVER GREATER THAN 100.4 F (38 C) OR HIGHER *CHILLS OR  SWEATING *NAUSEA AND VOMITING THAT IS NOT CONTROLLED WITH YOUR NAUSEA MEDICATION *UNUSUAL SHORTNESS OF BREATH *UNUSUAL BRUISING OR BLEEDING *URINARY PROBLEMS (pain or burning when urinating, or frequent urination) *BOWEL PROBLEMS (unusual diarrhea, constipation, pain near the anus) TENDERNESS IN MOUTH AND THROAT WITH OR WITHOUT PRESENCE OF ULCERS (sore throat, sores in mouth, or a toothache) UNUSUAL RASH, SWELLING OR PAIN  UNUSUAL VAGINAL DISCHARGE OR ITCHING   Items with * indicate a potential emergency and should be followed up as soon as possible or go to the Emergency Department if any problems should occur.  Please show the CHEMOTHERAPY ALERT CARD or IMMUNOTHERAPY ALERT CARD at check-in to the Emergency Department and triage nurse.  Should you have questions after your visit or need to cancel or reschedule your appointment, please contact [facility] [place] - A DEPT OF Eligha Bridegroom [facility] [phone]  and follow the prompts.  Office hours are 8:00 a.m. to 4:30 p.m. Monday - Friday. Please note that voicemails left after 4:00 p.m. may not be returned until the following business day.  We are closed weekends and major holidays. You have access to a nurse at all times for urgent questions. Please call the main number to the clinic [phone] and follow the prompts.  For any non-urgent questions, you may also contact your provider using MyChart. We now offer e-Visits for anyone 74 and older to request care online for non-urgent symptoms. For details visit [email or web address].   Also download the MyChart app! Go to the app store, search "MyChart", open the app, select [place], and log in with your MyChart username and password.

## 2024-08-11 ENCOUNTER — Other Ambulatory Visit: Payer: Self-pay | Admitting: Oncology

## 2024-08-17 NOTE — Progress Notes (Incomplete)
 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 female was transferred to my care after her prior physician has left.   ASSESSMENT & PLAN:   Assessment & Plan: Terri Mckinney  is a 43 y.o. female with ***  Assessment & Plan     The patient understands the plans discussed today and is in agreement with them.  She knows to contact our office if she develops concerns prior to her next appointment.  *** minutes of total time was spent for this patient encounter, including preparation,review of records,  face-to-face counseling with the patient and coordination of care, physical exam, and documentation of the encounter.    [name],acting as a Neurosurgeon for Mickiel Dry, MD.,have documented all relevant documentation on the behalf of Mickiel Dry, MD,as directed by  Mickiel Dry, MD while in the presence of Mickiel Dry, MD.  I, Mickiel Dry MD, have reviewed the above documentation for accuracy and completeness, and I agree with the above.    Verneta SAUNDERS Ege  [facility] [place] - A DEPT OF [facility] [address] [place] [address] Dept: [phone] Dept Fax: [phone]   No orders of the defined types were placed in this encounter.    ONCOLOGY HISTORY:   I have reviewed her chart and materials related to her cancer extensively and collaborated history with the patient. 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 She also noted  pain in the tailbone region and right sided lower back pain, as well as 50 pound weight loss which was partially intentional. -03/08/2022: CT AP: There is irregular circumferential masslike wall thickening of the sigmoid colon/rectum with adjacent perirectal adenopathy. There is a subtle 2.9 cm area of low attenuation within the central aspect of the liver which may represent a hepatic mass. Additionally there are multiple subcentimeter low-attenuation lesions in the liver. -03/15/2022: CEA 13.4 -elevated -03/21/2022: Colonoscopy found: Fungating infiltrative nearly completely obstructing mass in the rectosigmoid colon, which was circumferential measuring 4 cm in length.  Pathology: Invasive moderately differentiated adenocarcinoma. Tumor arises within tubular adenoma. No angiolymphatic or perineural invasion is seen. As there is very scant invasive tumor within these fragments. -03/23/2022: Initial PET: Rectal primary with hypermetabolic regional nodal metastasis. Multiple foci of hepatic hypermetabolism, corresponding to tiny liver lesions on CT. Highly suspicious for early hepatic metastasis. There is evidence of bilobar disease with small lesions in the left hepatic lobe which are best visualized on diffusion imaging. Reference lesion measuring 6 mm series 15, image 38 and on series 4, image 62 located in segment IVa/b. Reference lesion measuring 5 mm on series 4, image 59 in segment II. -04/01/2022: MRI Abdomen: Multiple small hypovascular rim-enhancing liver lesions, predominantly in the right hepatic lobe measuring up to 1.1 cm, highly suspicious for liver metastases. -04/20/2022: Liver biopsy. Pathology: Metastatic moderately differentiated colonic adenocarcinoma with mucinous features.  -04/20/2022: NGS:  K-ras G12 R mutation, PIK3CA exon 21 mutation. MS-stable. TMB-low.  -05/29/2022-11/15/2022: 13 cycles of FOLFIRI completed  with bevacizumab  added on cycle 2 -10/23/2022: MRI Pelvis: Known  metastatic rectal adenocarcinoma with persistent rectal narrowing and signs of involvement of the posterior cervical stroma and anterior peritoneal reflection. Signal characteristics within the posterior cervix suggest residual viable tumor in this location. Persistent presacral edema is not substantially changed compared to previous imaging. A bandlike area of signal extending from the rectum posteriorly likely reflects previous tumor now displaying low T2 signal suggesting post treatment changes. Distance from tumor to the internal anal sphincter is 5.5 cm. -11/10/2022: MRI Liver: Three liver lesions are identified, one of which has imaging characteristics most compatible with focal nodular hyperplasia. The other 2 lesions are likely transient hepatic attenuation difference and/or FNH. The previous small rim enhancing lesions in the liver shown on prior MRI are not readily apparent on today's exam, presumably reflecting response to therapy. -12/11/2022-01/18/2023: Chemoradiation therapy with Xeloda   -04/04/2023: Robotic assisted LAR, diverting ileostomy, TAH with BSO  Pathology: Rectosigmoid colon positive for invasive adenocarcinoma, moderately differentiated, with partial treatment response. 1.5 cm in size. Tumor invades invades cervix through serosal adhesion. Perineural invasion identified. No lymphovascular invasion identified. Negative margins. Twelve lymph nodes, negative for metastatic carcinoma (0/12). Benign ovaries and fallopian tubes. ypT4b, ypN0, ypM1a. MMR preserved.  -05/2023-09/2023: 6 cycles of FOLFIRI, held briefly prior to ileostomy takedown -11/07/2023: Diverting loop ileostomy takedown -12/10/2023-03/11/2024: FOLFIRI, discontinued due to progression -12/30/2023: CT CAP: Fat plane between the posterior wall of the vagina and the low rectum is obscured. There is no visible gas within the vagina although there does appear to be complex fluid in the vagina. 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 Progressive liver metastases. Cavitary nodule lateral right lung shows interval wall thickening and development of a posterior mural nodule. 3 mm nodule anterior left upper lobe previously is now 6 mm and qualitatively progressive in the interval. Features are concerning for progression of pulmonary metastases. Presacral soft tissue is similar to prior and likely treatment related. The chronic relatively thick-walled presacral cavity containing gas measures minimally smaller at 3.2 x 1.3 cm today compared to 3.6 x 2.0 cm previously and is compatible sequelae of previous surgery and subsequent reported anastomotic leak. 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 Stable pulmonary metastases. -03/25/2024-current: FOLFOX -06/09/2024: Patient evaluated by Dr. Barbaraann lia surgeon] at [facility] who recommended hepatic arterial pump and discussion with tumor board to give further recommendations. Patient was reluctant to undergo this procedure and instead wished to pursue Y90 treatments to her 2 metastatic hepatic lesions.   Current Treatment:  ***  INTERVAL HISTORY:   Terri Mckinney is here today for follow up and to establish care with me for metastatic rectal adenocarcinoma to the liver. Patient is accompanied by *** .     I have reviewed the past medical history, past surgical history, social history and family history with the patient and they are unchanged from previous note.  ALLERGIES:  is allergic to wellbutrin  [bupropion ] and  tramadol .  MEDICATIONS:  Current Outpatient Medications  Medication Sig Dispense Refill   estradiol  (ESTRACE  VAGINAL) 0.1 MG/GM vaginal cream 1 week after surgery, begin to use cream nightly at  bedtime for 2 weeks then 3 times a week after to assist with healing vagina. Use a fingertip size amount of cream on finger and place slightly past vaginal entrance. Do not use the applicator that comes with cream. 42.5 g 3   loperamide  (IMODIUM ) 2 MG capsule Take 1 capsule (2 mg total) by mouth 4 (four) times daily as needed for diarrhea or loose stools. 12 capsule 0   Multiple Vitamin (MULTIVITAMIN) tablet Take 1 tablet by mouth daily.     nicotine (NICODERM CQ - DOSED IN MG/24 HOURS) 21 mg/24hr patch Place 1 patch onto the skin.     nystatin ointment (MYCOSTATIN) Apply topically daily.     oxyCODONE  (ROXICODONE ) 5 MG immediate release tablet Take 1 tablet (5 mg total) by mouth every 6 (six) hours as needed for severe pain (pain score 7-10). 60 tablet 0   prochlorperazine  (COMPAZINE ) 10 MG tablet Take 1 tablet (10 mg total) by mouth every 6 (six) hours as needed (Nausea or vomiting). 30 tablet 1   No current facility-administered medications for this visit.    REVIEW OF SYSTEMS:   Constitutional: Denies fevers, chills or abnormal weight loss Eyes: Denies blurriness of vision Ears, nose, mouth, throat, and face: Denies mucositis or sore throat Respiratory: Denies cough, dyspnea or wheezes Cardiovascular: Denies palpitation, chest discomfort or lower extremity swelling Gastrointestinal:  Denies nausea, heartburn or change in bowel habits Skin: Denies abnormal skin rashes Lymphatics: Denies new lymphadenopathy or easy bruising Neurological:Denies numbness, tingling or new weaknesses Behavioral/Psych: Mood is stable, no new changes  All other systems were reviewed with the patient and are negative.   VITALS:  Last menstrual period 04/19/2022.  Wt Readings from Last 3 Encounters:  08/05/24 167  lb 3.2 oz (75.8 kg)  07/22/24 170 lb 3.2 oz (77.2 kg)  07/08/24 167 lb 9.6 oz (76 kg)    There is no height or weight on file to calculate BMI.  Performance status (ECOG): {CHL ONC [id number]  PHYSICAL EXAM:   GENERAL:alert, no distress and comfortable SKIN: skin color, texture, turgor are normal, no rashes or significant lesions EYES: normal, Conjunctiva are pink and non-injected, sclera clear OROPHARYNX:no exudate, no erythema and lips, buccal mucosa, and tongue normal  NECK: supple, thyroid  normal size, non-tender, without nodularity LYMPH:  no palpable lymphadenopathy in the cervical, axillary or inguinal LUNGS: clear to auscultation and percussion with normal breathing effort HEART: regular rate & rhythm and no murmurs and no lower extremity edema ABDOMEN:abdomen soft, non-tender and normal bowel sounds Musculoskeletal:no cyanosis of digits and no clubbing  NEURO: alert & oriented x 3 with fluent speech, no focal motor/sensory deficits  LABORATORY DATA:  I have reviewed the data as listed  No results found for: SPEP, UPEP  Lab Results  Component Value Date   WBC 5.6 08/05/2024   NEUTROABS 3.6 08/05/2024   HGB 12.6 08/05/2024   HCT 39.0 08/05/2024   MCV 96.1 08/05/2024   PLT 145 (L) 08/05/2024      Chemistry      Component Value Date/Time   NA 138 08/05/2024 0808   NA 144 03/03/2022 1618   K 4.4 08/05/2024 0808   CL 106 08/05/2024 0808   CO2 20 (L) 08/05/2024 0808   BUN 13 08/05/2024 0808   BUN 10 03/03/2022 1618   CREATININE 0.76 08/05/2024 0808      Component Value Date/Time   CALCIUM  8.9 08/05/2024 0808   ALKPHOS 83 08/05/2024 0808   AST 23 08/05/2024 0808   ALT  21 08/05/2024 0808   BILITOT 0.4 08/05/2024 0808   BILITOT <0.2 03/03/2022 1618       Latest Reference Range & Units 08/05/24 08:08  CEA 0.0 - 4.7 ng/mL 63.0 (H)  (H): Data is abnormally high  RADIOGRAPHIC STUDIES: I have personally reviewed the radiological images as listed and  agreed with the findings in the report.  CT CHEST ABDOMEN PELVIS W CONTRAST CLINICAL DATA:  Colorectal cancer, evaluate for metastatic disease. * Tracking Code: BO *  EXAM: CT CHEST, ABDOMEN, AND PELVIS WITH CONTRAST  TECHNIQUE: Multidetector CT imaging of the chest, abdomen and pelvis was performed following the standard protocol during bolus administration of intravenous contrast.  RADIATION DOSE REDUCTION: This exam was performed according to the departmental dose-optimization program which includes automated exposure control, adjustment of the mA and/or kV according to patient size and/or use of iterative reconstruction technique.  CONTRAST:  OMNIPAQUE  IOHEXOL  300 MG/ML  SOLN  COMPARISON:  CT pelvis 01/09/2024 and CT chest abdomen pelvis 12/30/2023.  FINDINGS: CT CHEST FINDINGS  Cardiovascular: Right IJ Port-A-Cath terminates in the high SVC and appears to contain a low-attenuation fibrin sheath along the tip. Heart is at the upper limits of normal in size. No pericardial effusion.  Mediastinum/Nodes: No pathologically enlarged mediastinal, hilar or axillary lymph nodes. Esophagus is grossly unremarkable.  Lungs/Pleura: Cavitary nodule in the inferior right upper lobe measures 9 x 12 mm (3/81), unchanged. Volume loss in the medial right upper, right middle and left upper lobes. Anterior segment left upper lobe nodule measures 5 mm (3/56), stable. No new pulmonary nodules. No pleural fluid. Airway is unremarkable.  Musculoskeletal: Degenerative changes in the spine. No worrisome lytic or sclerotic lesions.  CT ABDOMEN PELVIS FINDINGS  Hepatobiliary: Low-attenuation metastases are again seen in both lobes of the liver and appear minimally larger, measuring up to 5.0 x 5.7 cm in the left hepatic lobe (2/50), previously 4.9 x 5.1 cm. Liver is enlarged, 19.3 cm. Gallbladder is unremarkable. No biliary ductal dilatation.  Pancreas: Negative  Spleen:  Negative.  Adrenals/Urinary Tract: Adrenal glands and kidneys are unremarkable. Ureters are decompressed. Bladder is grossly unremarkable.  Stomach/Bowel: Stomach is unremarkable. Ileostomy reversal. Small-bowel and appendix are otherwise unremarkable. Moderate stool burden. Lower anterior resection with reanastomosis. Associated presacral soft tissue thickening.  Vascular/Lymphatic: Vascular structures are unremarkable. No pathologically enlarged lymph nodes.  Reproductive: Hysterectomy.  No adnexal mass.  Other: No free fluid.  Mesenteries and peritoneum are unremarkable.  Musculoskeletal: Minimal degenerative change in the spine. No worrisome lytic or sclerotic lesions.  IMPRESSION: 1. Slight progression of hepatic metastatic disease. 2. Stable pulmonary metastases. 3. Mild hepatomegaly.  Electronically Signed   By: Newell Eke M.D.   On: 03/24/2024 10:39

## 2024-08-18 ENCOUNTER — Inpatient Hospital Stay

## 2024-08-18 ENCOUNTER — Inpatient Hospital Stay: Admitting: Oncology

## 2024-08-19 NOTE — Progress Notes (Signed)
   08/19/24 1400  Spiritual Encounters  Type of Visit Attempt (pt unavailable)  Referral source Chaplain assessment  Reason for visit Routine spiritual support  OnCall Visit No   Terri Mckinney did not show up for her scheduled infusion yesterday and I had also had her on my schedule for a follow-up.  I attempted to contact her today by telephone but I got voicemail.  I left a message saying I was just checking in and hoping she was doing well.   Will plan to follow up at next infusion appointment.  Terri Mckinney, MDiv  Chaplain, [facility] [email or web address] [phone]

## 2024-08-20 ENCOUNTER — Other Ambulatory Visit: Payer: Self-pay

## 2024-08-20 ENCOUNTER — Inpatient Hospital Stay

## 2024-08-21 NOTE — Progress Notes (Signed)
 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 female was transferred to my care after her prior physician has left.   ASSESSMENT & PLAN:   Assessment & Plan: Terri Mckinney  is a 43 y.o. female with metastatic rectal adenocarcinoma to the liver  Assessment & Plan Rectal cancer metastasized to liver [facility]) Metastatic rectal carcinoma s/p chemotherapy with FOLFIRI and bevacizumab  followed by robotic assisted LAR, diverting ileostomy, TAH with BSO. Extensive oncology history below Progressed on FOLFIRI and bevacizumab  and was changed to FOLFOX. Currently increasing liver lesions and CEA trending up Was evaluated at [facility] for a HAIP and patient decided not to proceed with that Patient missed her referral for Y90  - Continue FOLFOX every 2 weeks at this time.  Patient is due for neck cycle on Monday - Labs reviewed today: CMP: Normal creatinine, normal LFTs, CBC: WBC: 3.9, hemoglobin: 11.4, platelets: 215, CEA: 63. - Physical exam stable today.  Can proceed with chemotherapy on Monday - Patient's last scan here was from 03/2024 and since then had an MRI and CT liver at [facility].  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  -Follows with gynecological surgeon at [facility], rectal Continue oxycodone  10 mg once daily as needed.  She is not requiring daily.   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   -Will  prescribe gabapentin  300mg  nightly Antineoplastic chemotherapy induced anemia Hemoglobin slightly low, expected with chemotherapy.  -Will continue to monitor    The patient understands the plans discussed today and is in agreement with them.  She knows to contact our office if she develops concerns prior to her next appointment.  6- minutes of total time was spent for this patient encounter, including preparation,review of records,  face-to-face counseling with the patient and coordination of care, physical exam, and documentation of the encounter.   Mickiel Dry, MD  [facility] [place] - A DEPT OF [facility] [address] [place] [address] Dept: [phone] Dept Fax: [phone]   Orders Placed This Encounter  Procedures   CT CHEST ABDOMEN PELVIS W CONTRAST    Standing Status:   Future    Expected Date:   09/05/2024    Expiration Date:   08/22/2025    If indicated for the ordered procedure, I authorize the administration of contrast media per Radiology protocol:   Yes    Does the patient have a contrast media/X-ray dye allergy?:   No    Preferred imaging location?:   [facility]    If indicated for the ordered procedure, I authorize the administration of oral contrast media per Radiology protocol:   Yes    Is patient pregnant?:   No     ONCOLOGY HISTORY:   I have reviewed her chart and materials related to her cancer extensively and collaborated history with the patient. Summary of oncologic history  is as follows:   Diagnosis: Stage IV (TX N1 M1) rectal adenocarcinoma to the liver    -Initial Presentation: Diarrhea with watery, bloody, and mucous-like stools up to 15 times a day. She also noted pain in the tailbone region and right sided lower back pain, as well as 50 pound weight loss which was partially intentional. -03/08/2022: CT AP: There is irregular circumferential masslike wall thickening of the  sigmoid colon/rectum with adjacent perirectal adenopathy. There is a subtle 2.9 cm area of low attenuation within the central aspect of the liver which may represent a hepatic mass. Additionally there are multiple subcentimeter low-attenuation lesions in the liver. -03/15/2022: CEA 13.4 -elevated -03/21/2022: Colonoscopy found: Fungating infiltrative nearly completely obstructing mass in the rectosigmoid colon, which was circumferential measuring 4 cm in length.  Pathology: Invasive moderately differentiated adenocarcinoma. Tumor arises within tubular adenoma. No angiolymphatic or perineural invasion is seen. As there is very scant invasive tumor within these fragments. -03/23/2022: Initial PET: Rectal primary with hypermetabolic regional nodal metastasis. Multiple foci of hepatic hypermetabolism, corresponding to tiny liver lesions on CT. Highly suspicious for early hepatic metastasis. There is evidence of bilobar disease with small lesions in the left hepatic lobe which are best visualized on diffusion imaging. Reference lesion measuring 6 mm series 15, image 38 and on series 4, image 62 located in segment IVa/b. Reference lesion measuring 5 mm on series 4, image 59 in segment II. -04/01/2022: MRI Abdomen: Multiple small hypovascular rim-enhancing liver lesions, predominantly in the right hepatic lobe measuring up to 1.1 cm, highly suspicious for liver metastases. -04/20/2022: Liver biopsy. Pathology: Metastatic moderately differentiated colonic adenocarcinoma with mucinous features.  -04/20/2022: NGS:  K-ras G12 R mutation, PIK3CA exon 21 mutation. MSI-stable. TMB-low.  -05/29/2022-11/15/2022: 13 cycles of FOLFIRI completed with bevacizumab  added on cycle 2 -08/24/2022: Germline mutation testing: Ambry genetics:Negative -10/23/2022: MRI Pelvis: Known metastatic rectal adenocarcinoma with persistent rectal narrowing and signs of involvement of the posterior cervical stroma and anterior peritoneal  reflection. Signal characteristics within the posterior cervix suggest residual viable tumor in this location. Persistent presacral edema is not substantially changed compared to previous imaging. A bandlike area of signal extending from the rectum posteriorly likely reflects previous tumor now displaying low T2 signal suggesting post treatment changes. Distance from tumor to the internal anal sphincter is 5.5 cm. -11/10/2022: MRI Liver: Three liver lesions are identified, one of which has imaging characteristics most compatible with focal nodular hyperplasia. The other 2 lesions are likely transient hepatic attenuation difference and/or FNH. The previous small rim enhancing lesions in the liver shown on prior MRI are not readily apparent on today's exam, presumably reflecting response to therapy. -12/11/2022-01/18/2023: Chemoradiation therapy with Xeloda   -04/04/2023: Robotic assisted LAR, diverting ileostomy, TAH with BSO  Pathology: Rectosigmoid colon positive for invasive adenocarcinoma, moderately differentiated, with partial treatment response. 1.5 cm in size. Tumor invades invades cervix through serosal adhesion. Perineural invasion identified. No lymphovascular invasion identified. Negative margins. Twelve lymph nodes, negative for metastatic carcinoma (0/12). Benign ovaries and fallopian tubes. ypT4b, ypN0, ypM1a. MMR preserved.  -05/2023-09/2023: 6 cycles of FOLFIRI, held briefly prior to ileostomy takedown -06/29/2023: CEA:31.3 -11/07/2023: Diverting loop ileostomy takedown -12/10/2023-03/11/2024: FOLFIRI + Bevacizumab , Bevacizumab  held after 01/22/2024 for rectovaginal fistula.  discontinued due to progression -12/30/2023: CT CAP: Fat plane between the posterior wall of the vagina and the low rectum is obscured. There is no visible gas within the vagina although there does appear to be complex fluid in the vagina. This fluid extends in a curvilinear  finger-like projection posteriorly at the  midline towards the expected location of the low rectum and could represent a rectal vaginal fistula. Progressive liver metastases. Cavitary nodule lateral right lung shows interval wall thickening and development of a posterior mural nodule. 3 mm nodule anterior left upper lobe previously is now 6 mm and qualitatively progressive in the interval. Features are concerning for progression of pulmonary metastases. Presacral soft tissue is similar to prior and likely treatment related. The chronic relatively thick-walled presacral cavity containing gas measures minimally smaller at 3.2 x 1.3 cm today compared to 3.6 x 2.0 cm previously and is compatible sequelae of previous surgery and subsequent reported anastomotic leak. -01/02/2024: Patient evaluated by Dr. Rogelio [gynecologic oncologist] at [facility] for rectovaginal fistula -01/22/2024: Colonoscopy: rectovaginal fistula, Bevacizumab  held due to fistula -03/18/2024: CT CAP: Slight progression of hepatic metastatic disease, measuring up to 5.0 x 5.7 cm in the left hepatic lobe, previously 4.9 x 5.1 cm. Stable pulmonary metastases. -03/25/2024-current: FOLFOX -06/09/2024: Patient evaluated by Dr. Barbaraann lia surgeon] at [facility] who recommended hepatic arterial pump and discussion with tumor board to give further recommendations. Patient was reluctant to undergo this procedure and instead wished to pursue Y90 treatments to her 2 metastatic hepatic lesions. -06/05/2024: CT liver: Hypoenhancing hepatic lesions, the largest within the left hepatic  lobe measuring up to 5.7 x 5.1 cm, slightly increased in size from 03/18/2024 when it measured up to 5.0 x 4.6 cm (5:84). Similarly a lesion within the central liver measures up to 1 cm, previously up to 7 mm (5:91), right hepatic dome lesion measures up to 2.1 x 2.0 cm, previously 2.0 x 1.9  cm (5:72). Foci of arterial hyperenhancement likely perfusional/vascular etiology.  -06/05/2024: MRI liver: Stable to  slightly increased size of multiple hypoenhancing hepatic  lesions as above, findings which are consistent with known hepatic metastasis.  -08/05/2024: CEA:63.0   Current Treatment:  FOLFOX  INTERVAL HISTORY:   Terri Mckinney is here today for follow up and to establish care with me for metastatic rectal carcinoma.   She experiences significant pain and pressure in her bottom due to a fistula, which also causes swelling in her feet and legs. She describes the pain as 'really getting to me'.  She reports numbness and tingling primarily in her fingers, particularly affecting three fingers, and also in her feet. She has not been taking gabapentin  recently, although she has a prescription at home that may be out of date.  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 Despite her symptoms, she continues to work.  She has no other complaints today.  Tolerating chemo well.  I have reviewed the past medical history, past surgical history, social history and family history with the patient and they are unchanged from previous note.  ALLERGIES:  is allergic to wellbutrin  [bupropion ] and tramadol .  MEDICATIONS:  Current Outpatient Medications  Medication Sig Dispense Refill   gabapentin  (NEURONTIN ) 300 MG capsule Take 1 capsule (300 mg total) by mouth at bedtime. 90 capsule 2   loperamide  (IMODIUM ) 2 MG capsule Take 1 capsule (2 mg total) by mouth 4 (four) times daily as needed for diarrhea or loose stools. 12 capsule 0   Multiple Vitamin (MULTIVITAMIN) tablet Take 1 tablet by mouth daily.     nicotine (NICODERM CQ - DOSED IN MG/24 HOURS) 21 mg/24hr patch Place 1 patch onto the skin.     nystatin ointment (MYCOSTATIN) Apply topically daily.     oxyCODONE  (ROXICODONE ) 5 MG immediate  release tablet Take 1 tablet (5 mg total) by mouth every 6 (six) hours as needed for severe pain (pain score 7-10). 60 tablet 0   prochlorperazine  (COMPAZINE ) 10 MG tablet Take 1 tablet (10  mg total) by mouth every 6 (six) hours as needed (Nausea or vomiting). 30 tablet 1   No current facility-administered medications for this visit.    REVIEW OF SYSTEMS:   Constitutional: Denies fevers, chills or abnormal weight loss Eyes: Denies blurriness of vision Ears, nose, mouth, throat, and face: Denies mucositis or sore throat Respiratory: Denies cough, dyspnea or wheezes Cardiovascular: Denies palpitation, chest discomfort or lower extremity swelling Gastrointestinal:  Denies nausea, heartburn or change in bowel habits Skin: Denies abnormal skin rashes Lymphatics: Denies new lymphadenopathy or easy bruising Neurological:Denies numbness, tingling or new weaknesses Behavioral/Psych: Mood is stable, no new changes  All other systems were reviewed with the patient and are negative.   VITALS:  Last menstrual period 04/19/2022.  Wt Readings from Last 3 Encounters:  08/22/24 166 lb (75.3 kg)  08/05/24 167 lb 3.2 oz (75.8 kg)  07/22/24 170 lb 3.2 oz (77.2 kg)    There is no height or weight on file to calculate BMI.  Performance status (ECOG): 1 - Symptomatic but completely ambulatory  PHYSICAL EXAM:   GENERAL:alert, no distress and comfortable SKIN: skin color, texture, turgor are normal, no rashes or significant lesions EYES: normal, Conjunctiva are pink and non-injected, sclera clear OROPHARYNX:no exudate, no erythema and lips, buccal mucosa, and tongue normal  NECK: supple, thyroid  normal size, non-tender, without nodularity LYMPH:  no palpable lymphadenopathy in the cervical, axillary or inguinal LUNGS: clear to auscultation and percussion with normal breathing effort HEART: regular rate & rhythm and no murmurs and no lower extremity edema ABDOMEN:abdomen soft, non-tender and normal bowel sounds Musculoskeletal:no cyanosis of digits and no clubbing  NEURO: alert & oriented x 3 with fluent speech, no focal motor/sensory deficits  LABORATORY DATA:  I have reviewed the  data as listed  Lab Results  Component Value Date   WBC 3.9 (L) 08/22/2024   NEUTROABS 1.6 (L) 08/22/2024   HGB 11.4 (L) 08/22/2024   HCT 36.0 08/22/2024   MCV 95.7 08/22/2024   PLT 215 08/22/2024     Chemistry      Component Value Date/Time   NA 139 08/22/2024 0947   NA 144 03/03/2022 1618   K 3.9 08/22/2024 0947   CL 106 08/22/2024 0947   CO2 23 08/22/2024 0947   BUN 11 08/22/2024 0947   BUN 10 03/03/2022 1618   CREATININE 0.66 08/22/2024 0947      Component Value Date/Time   CALCIUM  8.8 (L) 08/22/2024 0947   ALKPHOS 89 08/22/2024 0947   AST 20 08/22/2024 0947   ALT 15 08/22/2024 0947   BILITOT 0.5 08/22/2024 0947   BILITOT <0.2 03/03/2022 1618      Latest Reference Range & Units 08/05/24 08:08  CEA 0.0 - 4.7 ng/mL 63.0 (H)  (H): Data is abnormally high  RADIOGRAPHIC STUDIES: I have personally reviewed the radiological images as listed and agreed with the findings in the report.  CT CHEST ABDOMEN PELVIS W CONTRAST CLINICAL DATA:  Colorectal cancer, evaluate for metastatic disease. * Tracking Code: BO *  EXAM: CT CHEST, ABDOMEN, AND PELVIS WITH CONTRAST  TECHNIQUE: Multidetector CT imaging of the chest, abdomen and pelvis was performed following the standard protocol during bolus administration of intravenous contrast.  RADIATION DOSE REDUCTION: This exam was performed according to the departmental dose-optimization  program which includes automated exposure control, adjustment of the mA and/or kV according to patient size and/or use of iterative reconstruction technique.  CONTRAST:  OMNIPAQUE  IOHEXOL  300 MG/ML  SOLN  COMPARISON:  CT pelvis 01/09/2024 and CT chest abdomen pelvis 12/30/2023.  FINDINGS: CT CHEST FINDINGS  Cardiovascular: Right IJ Port-A-Cath terminates in the high SVC and appears to contain a low-attenuation fibrin sheath along the tip. Heart is at the upper limits of normal in size. No pericardial effusion.  Mediastinum/Nodes:  No pathologically enlarged mediastinal, hilar or axillary lymph nodes. Esophagus is grossly unremarkable.  Lungs/Pleura: Cavitary nodule in the inferior right upper lobe measures 9 x 12 mm (3/81), unchanged. Volume loss in the medial right upper, right middle and left upper lobes. Anterior segment left upper lobe nodule measures 5 mm (3/56), stable. No new pulmonary nodules. No pleural fluid. Airway is unremarkable.  Musculoskeletal: Degenerative changes in the spine. No worrisome lytic or sclerotic lesions.  CT ABDOMEN PELVIS FINDINGS  Hepatobiliary: Low-attenuation metastases are again seen in both lobes of the liver and appear minimally larger, measuring up to 5.0 x 5.7 cm in the left hepatic lobe (2/50), previously 4.9 x 5.1 cm. Liver is enlarged, 19.3 cm. Gallbladder is unremarkable. No biliary ductal dilatation.  Pancreas: Negative  Spleen: Negative.  Adrenals/Urinary Tract: Adrenal glands and kidneys are unremarkable. Ureters are decompressed. Bladder is grossly unremarkable.  Stomach/Bowel: Stomach is unremarkable. Ileostomy reversal. Small-bowel and appendix are otherwise unremarkable. Moderate stool burden. Lower anterior resection with reanastomosis. Associated presacral soft tissue thickening.  Vascular/Lymphatic: Vascular structures are unremarkable. No pathologically enlarged lymph nodes.  Reproductive: Hysterectomy.  No adnexal mass.  Other: No free fluid.  Mesenteries and peritoneum are unremarkable.  Musculoskeletal: Minimal degenerative change in the spine. No worrisome lytic or sclerotic lesions.  IMPRESSION: 1. Slight progression of hepatic metastatic disease. 2. Stable pulmonary metastases. 3. Mild hepatomegaly.  Electronically Signed   By: Newell Eke M.D.   On: 03/24/2024 10:39

## 2024-08-22 ENCOUNTER — Inpatient Hospital Stay: Admitting: Oncology

## 2024-08-22 ENCOUNTER — Inpatient Hospital Stay

## 2024-08-22 ENCOUNTER — Other Ambulatory Visit: Payer: Self-pay | Admitting: Oncology

## 2024-08-22 VITALS — BP 114/68 | HR 90 | Temp 98.1°F | Resp 18 | Wt 166.0 lb

## 2024-08-22 DIAGNOSIS — G629 Polyneuropathy, unspecified: Secondary | ICD-10-CM | POA: Insufficient documentation

## 2024-08-22 DIAGNOSIS — C787 Secondary malignant neoplasm of liver and intrahepatic bile duct: Secondary | ICD-10-CM

## 2024-08-22 DIAGNOSIS — Z5111 Encounter for antineoplastic chemotherapy: Secondary | ICD-10-CM | POA: Diagnosis not present

## 2024-08-22 DIAGNOSIS — K6289 Other specified diseases of anus and rectum: Secondary | ICD-10-CM | POA: Diagnosis not present

## 2024-08-22 DIAGNOSIS — G622 Polyneuropathy due to other toxic agents: Secondary | ICD-10-CM | POA: Diagnosis not present

## 2024-08-22 DIAGNOSIS — C20 Malignant neoplasm of rectum: Secondary | ICD-10-CM

## 2024-08-22 DIAGNOSIS — T451X5A Adverse effect of antineoplastic and immunosuppressive drugs, initial encounter: Secondary | ICD-10-CM

## 2024-08-22 DIAGNOSIS — N823 Fistula of vagina to large intestine: Secondary | ICD-10-CM

## 2024-08-22 DIAGNOSIS — Z95828 Presence of other vascular implants and grafts: Secondary | ICD-10-CM

## 2024-08-22 DIAGNOSIS — D6481 Anemia due to antineoplastic chemotherapy: Secondary | ICD-10-CM

## 2024-08-22 LAB — CBC WITH DIFFERENTIAL/PLATELET
Abs Immature Granulocytes: 0.01 K/uL (ref 0.00–0.07)
Basophils Absolute: 0.1 K/uL (ref 0.0–0.1)
Basophils Relative: 1 %
Eosinophils Absolute: 0.4 K/uL (ref 0.0–0.5)
Eosinophils Relative: 10 %
HCT: 36 % (ref 36.0–46.0)
Hemoglobin: 11.4 g/dL — ABNORMAL LOW (ref 12.0–15.0)
Immature Granulocytes: 0 %
Lymphocytes Relative: 29 %
Lymphs Abs: 1.1 K/uL (ref 0.7–4.0)
MCH: 30.3 pg (ref 26.0–34.0)
MCHC: 31.7 g/dL (ref 30.0–36.0)
MCV: 95.7 fL (ref 80.0–100.0)
Monocytes Absolute: 0.7 K/uL (ref 0.1–1.0)
Monocytes Relative: 19 %
Neutro Abs: 1.6 K/uL — ABNORMAL LOW (ref 1.7–7.7)
Neutrophils Relative %: 41 %
Platelets: 215 K/uL (ref 150–400)
RBC: 3.76 MIL/uL — ABNORMAL LOW (ref 3.87–5.11)
RDW: 16.3 % — ABNORMAL HIGH (ref 11.5–15.5)
WBC: 3.9 K/uL — ABNORMAL LOW (ref 4.0–10.5)
nRBC: 0 % (ref 0.0–0.2)

## 2024-08-22 LAB — COMPREHENSIVE METABOLIC PANEL WITH GFR
ALT: 15 U/L (ref 0–44)
AST: 20 U/L (ref 15–41)
Albumin: 3.4 g/dL — ABNORMAL LOW (ref 3.5–5.0)
Alkaline Phosphatase: 89 U/L (ref 38–126)
Anion gap: 10 (ref 5–15)
BUN: 11 mg/dL (ref 6–20)
CO2: 23 mmol/L (ref 22–32)
Calcium: 8.8 mg/dL — ABNORMAL LOW (ref 8.9–10.3)
Chloride: 106 mmol/L (ref 98–111)
Creatinine, Ser: 0.66 mg/dL (ref 0.44–1.00)
GFR, Estimated: >60 mL/min (ref >60)
Glucose, Bld: 105 mg/dL — ABNORMAL HIGH (ref 70–99)
Potassium: 3.9 mmol/L (ref 3.5–5.1)
Sodium: 139 mmol/L (ref 135–145)
Total Bilirubin: 0.5 mg/dL (ref 0.0–1.2)
Total Protein: 6.6 g/dL (ref 6.5–8.1)

## 2024-08-22 LAB — MAGNESIUM: Magnesium: 2.2 mg/dL (ref 1.7–2.4)

## 2024-08-22 MED ORDER — GABAPENTIN 300 MG PO CAPS: 300.0000 mg | ORAL_CAPSULE | Freq: Every day | ORAL | 2 refills | Status: AC

## 2024-08-22 NOTE — Assessment & Plan Note (Signed)
 Metastatic rectal carcinoma s/p chemotherapy with FOLFIRI and bevacizumab  followed by robotic assisted LAR, diverting ileostomy, TAH with BSO. Extensive oncology history below Progressed on FOLFIRI and bevacizumab  and was changed to FOLFOX. Currently increasing liver lesions and CEA trending up Was evaluated at [facility] for a HAIP and patient does not want to do that

## 2024-08-22 NOTE — Assessment & Plan Note (Addendum)
 Constant tingling in the bottom of the feet and toes is stable since the start of oxaliplatin  on 03/25/2024.   -Will prescribe gabapentin  300mg  nightly

## 2024-08-22 NOTE — Patient Instructions (Signed)
 [facility] [place] - A DEPT OF [facility]  Discharge Instructions: Thank you for choosing [facility] to provide your oncology and hematology care.  If you have a lab appointment with the Cancer Center - please note that after April 8th, 2024, all labs will be drawn in the cancer center.  You do not have to check in or register with the main entrance as you have in the past but will complete your check-in in the cancer center.  Wear comfortable clothing and clothing appropriate for easy access to any Portacath or PICC line.   We strive to give you quality time with your provider. You may need to reschedule your appointment if you arrive late (15 or more minutes).  Arriving late affects you and other patients whose appointments are after yours.  Also, if you miss three or more appointments without notifying the office, you may be dismissed from the clinic at the provider's discretion.      For prescription refill requests, have your pharmacy contact our office and allow 72 hours for refills to be completed.    Today you received the following port flush with labs, return as scheduled.   To help prevent nausea and vomiting after your treatment, we encourage you to take your nausea medication as directed.  BELOW ARE SYMPTOMS THAT SHOULD BE REPORTED IMMEDIATELY: *FEVER GREATER THAN 100.4 F (38 C) OR HIGHER *CHILLS OR SWEATING *NAUSEA AND VOMITING THAT IS NOT CONTROLLED WITH YOUR NAUSEA MEDICATION *UNUSUAL SHORTNESS OF BREATH *UNUSUAL BRUISING OR BLEEDING *URINARY PROBLEMS (pain or burning when urinating, or frequent urination) *BOWEL PROBLEMS (unusual diarrhea, constipation, pain near the anus) TENDERNESS IN MOUTH AND THROAT WITH OR WITHOUT PRESENCE OF ULCERS (sore throat, sores in mouth, or a toothache) UNUSUAL RASH, SWELLING OR PAIN  UNUSUAL VAGINAL DISCHARGE OR ITCHING   Items with * indicate a potential emergency and should be followed up as soon as  possible or go to the Emergency Department if any problems should occur.  Please show the CHEMOTHERAPY ALERT CARD or IMMUNOTHERAPY ALERT CARD at check-in to the Emergency Department and triage nurse.  Should you have questions after your visit or need to cancel or reschedule your appointment, please contact [facility] [place] - A DEPT OF Eligha Bridegroom [facility] [phone]  and follow the prompts.  Office hours are 8:00 a.m. to 4:30 p.m. Monday - Friday. Please note that voicemails left after 4:00 p.m. may not be returned until the following business day.  We are closed weekends and major holidays. You have access to a nurse at all times for urgent questions. Please call the main number to the clinic [phone] and follow the prompts.  For any non-urgent questions, you may also contact your provider using MyChart. We now offer e-Visits for anyone 52 and older to request care online for non-urgent symptoms. For details visit [email or web address].   Also download the MyChart app! Go to the app store, search "MyChart", open the app, select [place], and log in with your MyChart username and password.

## 2024-08-22 NOTE — Assessment & Plan Note (Signed)
 Metastatic rectal carcinoma s/p chemotherapy with FOLFIRI and bevacizumab  followed by robotic assisted LAR, diverting ileostomy, TAH with BSO. Extensive oncology history below Progressed on FOLFIRI and bevacizumab  and was changed to FOLFOX. Currently increasing liver lesions and CEA trending up Was evaluated at [facility] for a HAIP and patient decided not to proceed with that Patient missed her referral for Y90  - Continue FOLFOX every 2 weeks at this time.  Patient is due for neck cycle on Monday - Labs reviewed today: CMP: Normal creatinine, normal LFTs, CBC: WBC: 3.9, hemoglobin: 11.4, platelets: 215, CEA: 63. - Physical exam stable today.  Can proceed with chemotherapy on Monday - Patient's last scan here was from 03/2024 and since then had an MRI and CT liver at [facility].  Patient is due for her next scan and we will obtain a CT CAP - Will send a new referral for IR to consider Y90  Return to clinic in 4 weeks with CT scans

## 2024-08-22 NOTE — Progress Notes (Signed)
 Port flushed with good blood return noted. No bruising or swelling at site. Bandaid applied and patient discharged in satisfactory condition. VVS stable with no signs or symptoms of distressed noted.

## 2024-08-22 NOTE — Assessment & Plan Note (Signed)
 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  -Follows with gynecological surgeon at [facility]

## 2024-08-22 NOTE — Assessment & Plan Note (Addendum)
 Hemoglobin slightly low, expected with chemotherapy.  -Will continue to monitor

## 2024-08-22 NOTE — Assessment & Plan Note (Addendum)
 Continue oxycodone  10 mg once daily as needed.  She is not requiring daily.

## 2024-08-23 ENCOUNTER — Other Ambulatory Visit: Payer: Self-pay

## 2024-08-23 LAB — CEA: CEA: 74.1 ng/mL — ABNORMAL HIGH (ref 0.0–4.7)

## 2024-08-25 ENCOUNTER — Encounter: Payer: Self-pay | Admitting: Oncology

## 2024-08-25 ENCOUNTER — Inpatient Hospital Stay

## 2024-08-25 VITALS — BP 130/77 | HR 95 | Temp 96.7°F | Resp 18 | Wt 164.7 lb

## 2024-08-25 DIAGNOSIS — Z5111 Encounter for antineoplastic chemotherapy: Secondary | ICD-10-CM | POA: Diagnosis not present

## 2024-08-25 DIAGNOSIS — Z95828 Presence of other vascular implants and grafts: Secondary | ICD-10-CM

## 2024-08-25 DIAGNOSIS — C20 Malignant neoplasm of rectum: Secondary | ICD-10-CM

## 2024-08-25 MED ORDER — PALONOSETRON HCL INJECTION 0.25 MG/5ML
0.2500 mg | Freq: Once | INTRAVENOUS | Status: AC
  Administered 2024-08-25: 0.25 mg via INTRAVENOUS
  Filled 2024-08-25: qty 5

## 2024-08-25 MED ORDER — DEXAMETHASONE SODIUM PHOSPHATE 10 MG/ML IJ SOLN
10.0000 mg | Freq: Once | INTRAMUSCULAR | Status: AC
  Administered 2024-08-25: 10 mg via INTRAVENOUS
  Filled 2024-08-25: qty 1

## 2024-08-25 MED ORDER — DEXTROSE 5 % IV SOLN: INTRAVENOUS | Status: DC

## 2024-08-25 MED ORDER — SODIUM CHLORIDE 0.9 % IV SOLN
2400.0000 mg/m2 | INTRAVENOUS | Status: DC
  Administered 2024-08-25: 4400 mg via INTRAVENOUS
  Filled 2024-08-25: qty 88

## 2024-08-25 MED ORDER — FLUOROURACIL CHEMO INJECTION 2.5 GM/50ML
400.0000 mg/m2 | Freq: Once | INTRAVENOUS | Status: AC
  Administered 2024-08-25: 750 mg via INTRAVENOUS
  Filled 2024-08-25: qty 15

## 2024-08-25 MED ORDER — LEUCOVORIN CALCIUM INJECTION 350 MG
400.0000 mg/m2 | Freq: Once | INTRAVENOUS | Status: AC
  Administered 2024-08-25: 736 mg via INTRAVENOUS
  Filled 2024-08-25: qty 36.8

## 2024-08-25 MED ORDER — OXALIPLATIN CHEMO INJECTION 100 MG/20ML
65.0000 mg/m2 | Freq: Once | INTRAVENOUS | Status: AC
  Administered 2024-08-25: 120 mg via INTRAVENOUS
  Filled 2024-08-25: qty 20

## 2024-08-25 NOTE — Patient Instructions (Signed)
 [facility] [place] - A DEPT OF [facility]  Discharge Instructions: Thank you for choosing [facility] to provide your oncology and hematology care.  If you have a lab appointment with the Cancer Center - please note that after April 8th, 2024, all labs will be drawn in the cancer center.  You do not have to check in or register with the main entrance as you have in the past but will complete your check-in in the cancer center.  Wear comfortable clothing and clothing appropriate for easy access to any Portacath or PICC line.   We strive to give you quality time with your provider. You may need to reschedule your appointment if you arrive late (15 or more minutes).  Arriving late affects you and other patients whose appointments are after yours.  Also, if you miss three or more appointments without notifying the office, you may be dismissed from the clinic at the provider's discretion.      For prescription refill requests, have your pharmacy contact our office and allow 72 hours for refills to be completed.    Today you received the following chemotherapy and/or immunotherapy agents FOLFOX      To help prevent nausea and vomiting after your treatment, we encourage you to take your nausea medication as directed.  BELOW ARE SYMPTOMS THAT SHOULD BE REPORTED IMMEDIATELY: *FEVER GREATER THAN 100.4 F (38 C) OR HIGHER *CHILLS OR SWEATING *NAUSEA AND VOMITING THAT IS NOT CONTROLLED WITH YOUR NAUSEA MEDICATION *UNUSUAL SHORTNESS OF BREATH *UNUSUAL BRUISING OR BLEEDING *URINARY PROBLEMS (pain or burning when urinating, or frequent urination) *BOWEL PROBLEMS (unusual diarrhea, constipation, pain near the anus) TENDERNESS IN MOUTH AND THROAT WITH OR WITHOUT PRESENCE OF ULCERS (sore throat, sores in mouth, or a toothache) UNUSUAL RASH, SWELLING OR PAIN  UNUSUAL VAGINAL DISCHARGE OR ITCHING   Items with * indicate a potential emergency and should be followed up as  soon as possible or go to the Emergency Department if any problems should occur.  Please show the CHEMOTHERAPY ALERT CARD or IMMUNOTHERAPY ALERT CARD at check-in to the Emergency Department and triage nurse.  Should you have questions after your visit or need to cancel or reschedule your appointment, please contact [facility] [place] - A DEPT OF [facility] [phone]  and follow the prompts.  Office hours are 8:00 a.m. to 4:30 p.m. Monday - Friday. Please note that voicemails left after 4:00 p.m. may not be returned until the following business day.  We are closed weekends and major holidays. You have access to a nurse at all times for urgent questions. Please call the main number to the clinic [phone] and follow the prompts.  For any non-urgent questions, you may also contact your provider using MyChart. We now offer e-Visits for anyone 35 and older to request care online for non-urgent symptoms. For details visit [email or web address].   Also download the MyChart app! Go to the app store, search MyChart, open the app, select [place], and log in with your MyChart username and password.

## 2024-08-25 NOTE — Progress Notes (Signed)
 Patient presents today for Folfox infusion.  Patient is in satisfactory condition with no new complaints voiced.  Vital signs are stable.  Labs reviewed on 08/22/24 and all labs are within treatment parameters.  We will proceed with treatment per MD orders.    Treatment given today per MD orders. Tolerated infusion without adverse affects. Vital signs stable. No complaints at this time. Discharged from clinic ambulatory in stable condition. Alert and oriented x 3. F/U with [facility] as scheduled. 5FU ambulatory pump infusing with no alarms beeping.

## 2024-08-27 ENCOUNTER — Ambulatory Visit: Admitting: Oncology

## 2024-08-27 ENCOUNTER — Ambulatory Visit

## 2024-08-27 ENCOUNTER — Other Ambulatory Visit

## 2024-08-27 ENCOUNTER — Inpatient Hospital Stay

## 2024-08-27 VITALS — BP 108/66 | HR 100 | Temp 97.7°F | Resp 18

## 2024-08-27 DIAGNOSIS — Z95828 Presence of other vascular implants and grafts: Secondary | ICD-10-CM

## 2024-08-27 DIAGNOSIS — Z5111 Encounter for antineoplastic chemotherapy: Secondary | ICD-10-CM | POA: Diagnosis not present

## 2024-08-27 DIAGNOSIS — C20 Malignant neoplasm of rectum: Secondary | ICD-10-CM

## 2024-08-27 NOTE — Progress Notes (Signed)
 Terri Mckinney presents to have home infusion pump d/c'd and for port-a-cath deaccess with flush.  Portacath located right chest wall accessed with  H 20 needle.  No blood return and no resistance met.   Portacath flushed with NS 20 mls and needle removed intact.  Procedure tolerated well and without incident.

## 2024-08-27 NOTE — Patient Instructions (Signed)
 [facility] [place] - A DEPT OF [facility]  Discharge Instructions: Thank you for choosing [facility] to provide your oncology and hematology care.  If you have a lab appointment with the Cancer Center - please note that after April 8th, 2024, all labs will be drawn in the cancer center.  You do not have to check in or register with the main entrance as you have in the past but will complete your check-in in the cancer center.  Wear comfortable clothing and clothing appropriate for easy access to any Portacath or PICC line.   We strive to give you quality time with your provider. You may need to reschedule your appointment if you arrive late (15 or more minutes).  Arriving late affects you and other patients whose appointments are after yours.  Also, if you miss three or more appointments without notifying the office, you may be dismissed from the clinic at the provider's discretion.      For prescription refill requests, have your pharmacy contact our office and allow 72 hours for refills to be completed.    Today you received the following: ambulatory pump disconnected per protocol   To help prevent nausea and vomiting after your treatment, we encourage you to take your nausea medication as directed.  BELOW ARE SYMPTOMS THAT SHOULD BE REPORTED IMMEDIATELY: *FEVER GREATER THAN 100.4 F (38 C) OR HIGHER *CHILLS OR SWEATING *NAUSEA AND VOMITING THAT IS NOT CONTROLLED WITH YOUR NAUSEA MEDICATION *UNUSUAL SHORTNESS OF BREATH *UNUSUAL BRUISING OR BLEEDING *URINARY PROBLEMS (pain or burning when urinating, or frequent urination) *BOWEL PROBLEMS (unusual diarrhea, constipation, pain near the anus) TENDERNESS IN MOUTH AND THROAT WITH OR WITHOUT PRESENCE OF ULCERS (sore throat, sores in mouth, or a toothache) UNUSUAL RASH, SWELLING OR PAIN  UNUSUAL VAGINAL DISCHARGE OR ITCHING   Items with * indicate a potential emergency and should be followed up as soon as  possible or go to the Emergency Department if any problems should occur.  Please show the CHEMOTHERAPY ALERT CARD or IMMUNOTHERAPY ALERT CARD at check-in to the Emergency Department and triage nurse.  Should you have questions after your visit or need to cancel or reschedule your appointment, please contact [facility]ER [facility] [place] - A DEPT OF [facility] [phone]  and follow the prompts.  Office hours are 8:00 a.m. to 4:30 p.m. Monday - Friday. Please note that voicemails left after 4:00 p.m. may not be returned until the following business day.  We are closed weekends and major holidays. You have access to a nurse at all times for urgent questions. Please call the main number to the clinic [phone] and follow the prompts.  For any non-urgent questions, you may also contact your provider using MyChart. We now offer e-Visits for anyone 32 and older to request care online for non-urgent symptoms. For details visit [email or web address].   Also download the MyChart app! Go to the app store, search "MyChart", open the app, select [place], and log in with your MyChart username and password.

## 2024-08-29 ENCOUNTER — Encounter

## 2024-09-03 ENCOUNTER — Inpatient Hospital Stay: Admission: RE | Admit: 2024-09-03 | Source: Ambulatory Visit

## 2024-09-09 ENCOUNTER — Inpatient Hospital Stay

## 2024-09-09 ENCOUNTER — Inpatient Hospital Stay: Attending: Hematology

## 2024-09-09 ENCOUNTER — Inpatient Hospital Stay: Admitting: Oncology

## 2024-09-09 ENCOUNTER — Encounter: Payer: Self-pay | Admitting: Oncology

## 2024-09-09 VITALS — BP 102/55 | HR 71 | Temp 97.8°F | Resp 18

## 2024-09-09 DIAGNOSIS — K6289 Other specified diseases of anus and rectum: Secondary | ICD-10-CM | POA: Diagnosis not present

## 2024-09-09 DIAGNOSIS — T451X5A Adverse effect of antineoplastic and immunosuppressive drugs, initial encounter: Secondary | ICD-10-CM | POA: Diagnosis not present

## 2024-09-09 DIAGNOSIS — G620 Drug-induced polyneuropathy: Secondary | ICD-10-CM | POA: Insufficient documentation

## 2024-09-09 DIAGNOSIS — C787 Secondary malignant neoplasm of liver and intrahepatic bile duct: Secondary | ICD-10-CM | POA: Insufficient documentation

## 2024-09-09 DIAGNOSIS — Z95828 Presence of other vascular implants and grafts: Secondary | ICD-10-CM

## 2024-09-09 DIAGNOSIS — F17210 Nicotine dependence, cigarettes, uncomplicated: Secondary | ICD-10-CM | POA: Insufficient documentation

## 2024-09-09 DIAGNOSIS — C20 Malignant neoplasm of rectum: Secondary | ICD-10-CM | POA: Diagnosis not present

## 2024-09-09 DIAGNOSIS — Z5111 Encounter for antineoplastic chemotherapy: Secondary | ICD-10-CM | POA: Insufficient documentation

## 2024-09-09 DIAGNOSIS — N823 Fistula of vagina to large intestine: Secondary | ICD-10-CM | POA: Insufficient documentation

## 2024-09-09 DIAGNOSIS — C7800 Secondary malignant neoplasm of unspecified lung: Secondary | ICD-10-CM | POA: Insufficient documentation

## 2024-09-09 LAB — COMPREHENSIVE METABOLIC PANEL WITH GFR
ALT: 11 U/L (ref 0–44)
AST: 20 U/L (ref 15–41)
Albumin: 3.6 g/dL (ref 3.5–5.0)
Alkaline Phosphatase: 92 U/L (ref 38–126)
Anion gap: 13 (ref 5–15)
BUN: 7 mg/dL (ref 6–20)
CO2: 22 mmol/L (ref 22–32)
Calcium: 9.1 mg/dL (ref 8.9–10.3)
Chloride: 106 mmol/L (ref 98–111)
Creatinine, Ser: 0.6 mg/dL (ref 0.44–1.00)
GFR, Estimated: >60 mL/min (ref >60)
Glucose, Bld: 94 mg/dL (ref 70–99)
Potassium: 4 mmol/L (ref 3.5–5.1)
Sodium: 141 mmol/L (ref 135–145)
Total Bilirubin: <0.2 mg/dL (ref 0.0–1.2)
Total Protein: 6.4 g/dL — ABNORMAL LOW (ref 6.5–8.1)

## 2024-09-09 LAB — CBC WITH DIFFERENTIAL/PLATELET
Abs Immature Granulocytes: 0.02 K/uL (ref 0.00–0.07)
Basophils Absolute: 0.1 K/uL (ref 0.0–0.1)
Basophils Relative: 1 %
Eosinophils Absolute: 0.6 K/uL — ABNORMAL HIGH (ref 0.0–0.5)
Eosinophils Relative: 12 %
HCT: 33.1 % — ABNORMAL LOW (ref 36.0–46.0)
Hemoglobin: 11 g/dL — ABNORMAL LOW (ref 12.0–15.0)
Immature Granulocytes: 0 %
Lymphocytes Relative: 19 %
Lymphs Abs: 0.9 K/uL (ref 0.7–4.0)
MCH: 30.9 pg (ref 26.0–34.0)
MCHC: 33.2 g/dL (ref 30.0–36.0)
MCV: 93 fL (ref 80.0–100.0)
Monocytes Absolute: 0.6 K/uL (ref 0.1–1.0)
Monocytes Relative: 13 %
Neutro Abs: 2.5 K/uL (ref 1.7–7.7)
Neutrophils Relative %: 55 %
Platelets: 297 K/uL (ref 150–400)
RBC: 3.56 MIL/uL — ABNORMAL LOW (ref 3.87–5.11)
RDW: 15.8 % — ABNORMAL HIGH (ref 11.5–15.5)
WBC: 4.6 K/uL (ref 4.0–10.5)
nRBC: 0 % (ref 0.0–0.2)

## 2024-09-09 LAB — MAGNESIUM: Magnesium: 2.1 mg/dL (ref 1.7–2.4)

## 2024-09-09 MED ORDER — OXALIPLATIN CHEMO INJECTION 100 MG/20ML
65.0000 mg/m2 | Freq: Once | INTRAVENOUS | Status: AC
  Administered 2024-09-09: 120 mg via INTRAVENOUS
  Filled 2024-09-09: qty 20

## 2024-09-09 MED ORDER — LEUCOVORIN CALCIUM INJECTION 350 MG
400.0000 mg/m2 | Freq: Once | INTRAVENOUS | Status: AC
  Administered 2024-09-09: 736 mg via INTRAVENOUS
  Filled 2024-09-09: qty 36.8

## 2024-09-09 MED ORDER — HYDROMORPHONE HCL 1 MG/ML IJ SOLN
2.0000 mg | Freq: Once | INTRAMUSCULAR | Status: AC
  Administered 2024-09-09: 2 mg via INTRAVENOUS
  Filled 2024-09-09: qty 2

## 2024-09-09 MED ORDER — DEXTROSE 5 % IV SOLN: INTRAVENOUS | Status: DC

## 2024-09-09 MED ORDER — ALTEPLASE 2 MG IJ SOLR
2.0000 mg | Freq: Once | INTRAMUSCULAR | Status: AC
  Administered 2024-09-09: 2 mg

## 2024-09-09 MED ORDER — PALONOSETRON HCL INJECTION 0.25 MG/5ML
0.2500 mg | Freq: Once | INTRAVENOUS | Status: AC
  Administered 2024-09-09: 0.25 mg via INTRAVENOUS
  Filled 2024-09-09: qty 5

## 2024-09-09 MED ORDER — FLUOROURACIL CHEMO INJECTION 2.5 GM/50ML
400.0000 mg/m2 | Freq: Once | INTRAVENOUS | Status: AC
  Administered 2024-09-09: 750 mg via INTRAVENOUS
  Filled 2024-09-09: qty 15

## 2024-09-09 MED ORDER — DEXAMETHASONE SODIUM PHOSPHATE 10 MG/ML IJ SOLN
10.0000 mg | Freq: Once | INTRAMUSCULAR | Status: AC
  Administered 2024-09-09: 10 mg via INTRAVENOUS
  Filled 2024-09-09: qty 1

## 2024-09-09 MED ORDER — SODIUM CHLORIDE 0.9 % IV SOLN
2400.0000 mg/m2 | INTRAVENOUS | Status: DC
  Administered 2024-09-09: 4400 mg via INTRAVENOUS
  Filled 2024-09-09: qty 88

## 2024-09-09 NOTE — Patient Instructions (Signed)
 [facility] [place] - A DEPT OF [facility]  Discharge Instructions: Thank you for choosing [facility] to provide your oncology and hematology care.  If you have a lab appointment with the Cancer Center - please note that after April 8th, 2024, all labs will be drawn in the cancer center.  You do not have to check in or register with the main entrance as you have in the past but will complete your check-in in the cancer center.  Wear comfortable clothing and clothing appropriate for easy access to any Portacath or PICC line.   We strive to give you quality time with your provider. You may need to reschedule your appointment if you arrive late (15 or more minutes).  Arriving late affects you and other patients whose appointments are after yours.  Also, if you miss three or more appointments without notifying the office, you may be dismissed from the clinic at the provider's discretion.      For prescription refill requests, have your pharmacy contact our office and allow 72 hours for refills to be completed.    Today you received the following chemotherapy and/or immunotherapy agents oxaliplatin , leucovorin , adrucil    To help prevent nausea and vomiting after your treatment, we encourage you to take your nausea medication as directed.  BELOW ARE SYMPTOMS THAT SHOULD BE REPORTED IMMEDIATELY: *FEVER GREATER THAN 100.4 F (38 C) OR HIGHER *CHILLS OR SWEATING *NAUSEA AND VOMITING THAT IS NOT CONTROLLED WITH YOUR NAUSEA MEDICATION *UNUSUAL SHORTNESS OF BREATH *UNUSUAL BRUISING OR BLEEDING *URINARY PROBLEMS (pain or burning when urinating, or frequent urination) *BOWEL PROBLEMS (unusual diarrhea, constipation, pain near the anus) TENDERNESS IN MOUTH AND THROAT WITH OR WITHOUT PRESENCE OF ULCERS (sore throat, sores in mouth, or a toothache) UNUSUAL RASH, SWELLING OR PAIN  UNUSUAL VAGINAL DISCHARGE OR ITCHING   Items with * indicate a potential emergency and  should be followed up as soon as possible or go to the Emergency Department if any problems should occur.  Please show the CHEMOTHERAPY ALERT CARD or IMMUNOTHERAPY ALERT CARD at check-in to the Emergency Department and triage nurse.  Should you have questions after your visit or need to cancel or reschedule your appointment, please contact [facility] [place] - A DEPT OF [facility] [phone]  and follow the prompts.  Office hours are 8:00 a.m. to 4:30 p.m. Monday - Friday. Please note that voicemails left after 4:00 p.m. may not be returned until the following business day.  We are closed weekends and major holidays. You have access to a nurse at all times for urgent questions. Please call the main number to the clinic [phone] and follow the prompts.  For any non-urgent questions, you may also contact your provider using MyChart. We now offer e-Visits for anyone 13 and older to request care online for non-urgent symptoms. For details visit [email or web address].   Also download the MyChart app! Go to the app store, search MyChart, open the app, select [place], and log in with your MyChart username and password.

## 2024-09-09 NOTE — Progress Notes (Signed)
   09/09/24 1400  Spiritual Encounters  Type of Visit Follow up  Care provided to: Patient  Referral source Chaplain assessment  Reason for visit Routine spiritual support  Spiritual Framework  Presenting Themes Coping tools;Caregiving needs;Values and beliefs;Significant life change;Community and relationships  Community/Connection Family;Friend(s);Other (comment) (Strong network of co-workers)  Patient Stress Factors Health changes;Major life changes  Interventions  Spiritual Care Interventions Made Compassionate presence;Reflective listening;Narrative/life review;Explored values/beliefs/practices/strengths;Meaning making;Encouragement;Prayer  Intervention Outcomes  Outcomes Awareness around self/spiritual resourses;Reduced anxiety;Awareness of support  Spiritual Care Plan  Spiritual Care Issues Still Outstanding Chaplain will continue to follow   Reason for Visit: Chaplain making scheduled follow-up with Pt I previously connected with.  Description of Visit: I entered the room and found Terri Mckinney sitting in the chair receiving her treatment, with no support person present.   When I asked how her day was going she commented, better now.  She was experiencing a good bit of pain this morning in her lower body.  The staff here was able to provide some relief to that situation, but the ultimate solution is not an easy or a quick one.   We processed those feelings.  I asked about Terri Mckinney's sister who she told me at a previous visit had a stroke.  She remarked that there is good improvement.  She mentioned that her sister sang with other family at a reunion this weekend.  Terri Mckinney shared that because of her cancer journey, her family has gotten closer, and that 2 of them are now back in church and getting baptized.  She states that she can see the Terri Mckinney working through her experience and is encouraged by this.  As I explored her support systems caregiving resources Terri Mckinney reports that her  sisters are her go to people, and she talked about the wonderfully supportive work environment she enjoys.  We talked some about Terri Mckinney's emotions surrounding her prognosis and some disappointments she has experienced in the journey.  Plan of Care: Terri Mckinney is strong and apparently well supported, but I will still plan to continue to follow up with her at least monthly for now.   Terri Mckinney, MDiv  Chaplain, [facility] [email or web address] [phone]

## 2024-09-09 NOTE — Progress Notes (Signed)
 Labs reviewed today. Ok to proceed with treatment today per parameters.  Treatment given per orders. Patient tolerated it well without problems. Vitals stable and discharged home from clinic ambulatory. Follow up as scheduled.

## 2024-09-10 LAB — CEA: CEA: 73.3 ng/mL — ABNORMAL HIGH (ref 0.0–4.7)

## 2024-09-11 ENCOUNTER — Inpatient Hospital Stay

## 2024-09-11 NOTE — Progress Notes (Signed)
 Patient presents today for 5FU chemotherapy pump disconnection per provider's order. Vital signs stable and patient voiced no new complaints at this time.   Port flushed easily with 20 mL of normal saline. Good blood return noted and needle removed intact. No bruising or swelling noted at the site.  Discharged from clinic ambulatory in stable condition. Alert and oriented x 3. F/U with [facility] as scheduled.

## 2024-09-15 ENCOUNTER — Ambulatory Visit (HOSPITAL_COMMUNITY)
Admission: RE | Admit: 2024-09-15 | Discharge: 2024-09-15 | Disposition: A | Discharge status: Home / Self Care | Source: Ambulatory Visit | Attending: Oncology | Admitting: Oncology

## 2024-09-15 DIAGNOSIS — C787 Secondary malignant neoplasm of liver and intrahepatic bile duct: Secondary | ICD-10-CM | POA: Diagnosis present

## 2024-09-15 DIAGNOSIS — C20 Malignant neoplasm of rectum: Secondary | ICD-10-CM | POA: Insufficient documentation

## 2024-09-15 MED ORDER — IOHEXOL 300 MG/ML  SOLN
100.0000 mL | Freq: Once | INTRAMUSCULAR | Status: AC | PRN
  Administered 2024-09-15: 100 mL via INTRAVENOUS

## 2024-09-16 ENCOUNTER — Other Ambulatory Visit: Payer: Self-pay | Admitting: Oncology

## 2024-09-16 DIAGNOSIS — C20 Malignant neoplasm of rectum: Secondary | ICD-10-CM

## 2024-09-16 DIAGNOSIS — Z95828 Presence of other vascular implants and grafts: Secondary | ICD-10-CM

## 2024-09-17 ENCOUNTER — Other Ambulatory Visit: Payer: Self-pay

## 2024-09-23 ENCOUNTER — Inpatient Hospital Stay

## 2024-09-23 ENCOUNTER — Telehealth: Payer: Self-pay | Admitting: Pharmacist

## 2024-09-23 ENCOUNTER — Telehealth: Payer: Self-pay | Admitting: Pharmacy Technician

## 2024-09-23 ENCOUNTER — Encounter: Payer: Self-pay | Admitting: Oncology

## 2024-09-23 ENCOUNTER — Inpatient Hospital Stay: Admitting: Oncology

## 2024-09-23 ENCOUNTER — Other Ambulatory Visit: Payer: Self-pay | Admitting: *Deleted

## 2024-09-23 ENCOUNTER — Other Ambulatory Visit (HOSPITAL_COMMUNITY): Payer: Self-pay

## 2024-09-23 DIAGNOSIS — K6289 Other specified diseases of anus and rectum: Secondary | ICD-10-CM

## 2024-09-23 DIAGNOSIS — C20 Malignant neoplasm of rectum: Secondary | ICD-10-CM | POA: Diagnosis not present

## 2024-09-23 DIAGNOSIS — Z5111 Encounter for antineoplastic chemotherapy: Secondary | ICD-10-CM | POA: Diagnosis not present

## 2024-09-23 DIAGNOSIS — N823 Fistula of vagina to large intestine: Secondary | ICD-10-CM | POA: Diagnosis not present

## 2024-09-23 DIAGNOSIS — Z95828 Presence of other vascular implants and grafts: Secondary | ICD-10-CM

## 2024-09-23 DIAGNOSIS — C787 Secondary malignant neoplasm of liver and intrahepatic bile duct: Secondary | ICD-10-CM

## 2024-09-23 DIAGNOSIS — G622 Polyneuropathy due to other toxic agents: Secondary | ICD-10-CM | POA: Diagnosis not present

## 2024-09-23 LAB — COMPREHENSIVE METABOLIC PANEL WITH GFR
ALT: 13 U/L (ref 0–44)
AST: 22 U/L (ref 15–41)
Albumin: 3.9 g/dL (ref 3.5–5.0)
Alkaline Phosphatase: 103 U/L (ref 38–126)
Anion gap: 12 (ref 5–15)
BUN: 9 mg/dL (ref 6–20)
CO2: 23 mmol/L (ref 22–32)
Calcium: 9 mg/dL (ref 8.9–10.3)
Chloride: 106 mmol/L (ref 98–111)
Creatinine, Ser: 0.73 mg/dL (ref 0.44–1.00)
GFR, Estimated: >60 mL/min (ref >60)
Glucose, Bld: 124 mg/dL — ABNORMAL HIGH (ref 70–99)
Potassium: 3.7 mmol/L (ref 3.5–5.1)
Sodium: 141 mmol/L (ref 135–145)
Total Bilirubin: 0.4 mg/dL (ref 0.0–1.2)
Total Protein: 6.5 g/dL (ref 6.5–8.1)

## 2024-09-23 LAB — CBC WITH DIFFERENTIAL/PLATELET
Abs Immature Granulocytes: 0.01 K/uL (ref 0.00–0.07)
Basophils Absolute: 0.1 K/uL (ref 0.0–0.1)
Basophils Relative: 1 %
Eosinophils Absolute: 0.3 K/uL (ref 0.0–0.5)
Eosinophils Relative: 7 %
HCT: 35.4 % — ABNORMAL LOW (ref 36.0–46.0)
Hemoglobin: 11.4 g/dL — ABNORMAL LOW (ref 12.0–15.0)
Immature Granulocytes: 0 %
Lymphocytes Relative: 17 %
Lymphs Abs: 0.8 K/uL (ref 0.7–4.0)
MCH: 30.6 pg (ref 26.0–34.0)
MCHC: 32.2 g/dL (ref 30.0–36.0)
MCV: 94.9 fL (ref 80.0–100.0)
Monocytes Absolute: 0.3 K/uL (ref 0.1–1.0)
Monocytes Relative: 6 %
Neutro Abs: 3.2 K/uL (ref 1.7–7.7)
Neutrophils Relative %: 69 %
Platelets: 152 K/uL (ref 150–400)
RBC: 3.73 MIL/uL — ABNORMAL LOW (ref 3.87–5.11)
RDW: 17 % — ABNORMAL HIGH (ref 11.5–15.5)
WBC: 4.7 K/uL (ref 4.0–10.5)
nRBC: 0 % (ref 0.0–0.2)

## 2024-09-23 LAB — MAGNESIUM: Magnesium: 2.3 mg/dL (ref 1.7–2.4)

## 2024-09-23 MED ORDER — LONSURF 20-8.19 MG PO TABS: 60.0000 mg | ORAL_TABLET | Freq: Two times a day (BID) | ORAL | 2 refills | Status: AC

## 2024-09-23 MED ORDER — OXYCODONE HCL 5 MG PO TABS: 5.0000 mg | ORAL_TABLET | Freq: Four times a day (QID) | ORAL | 0 refills | Status: AC | PRN

## 2024-09-23 NOTE — Addendum Note (Signed)
 Addended by: Michalle Rademaker on: 09/23/2024 11:26 PM   Modules accepted: Orders

## 2024-09-23 NOTE — Progress Notes (Unsigned)
 Patient seen by Dr Davonna today. Patient not receiving treatment due to progression. Patient's treatment to be changed to [place]. Port deaccessed per MD order.

## 2024-09-23 NOTE — Progress Notes (Signed)
 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 Extensive oncology history below Progressed on FOLFIRI and bevacizumab  and was changed to FOLFOX. Currently increasing liver lesions and CEA trending up Was evaluated at [facility] for a HAIP and patient decided not to proceed with that Patient missed her referral for Y90   - We reviewed the recent CT scan findings together.  There is increase in size of liver lesions and also increase in size of cavitary nodule in the right lung. - Patient also reports significant worsening neuropathy in her feet. -Discussed that with the current progression, she would require change in treatment at this time. - Discussed that the best next available option is Lonsurf.  Discussed risk versus benefits in detail along with most common side effects.  Will start at 60 mg twice daily from days 1-5, 8-12 every 28 days. Lonsurf commonly causes myelosuppression (especially neutropenia), gastrointestinal symptoms (nausea, diarrhea), fatigue, and anemia, with serious risks including febrile neutropenia and rare intestinal obstruction.  - Labs reviewed today: CMP: Normal creatinine, normal LFTs, CBC: WBC: 4.7, hemoglobin: 11.4, platelets: 152, CEA: 73.3 on 09/09/2024. -Will refer to [facility] for potential clinical trial with Dr. Michiel for KRAS mutation, noting two-month wait time.  I  discussed with Dr. Michiel about sending a referral. - Monitor blood counts regularly. - Will provide antiemetics for nausea and vomiting. - Emphasized importance of considering quality of life and getting affairs in order due to limited prognosis.  Return to clinic in 3 weeks to assess for tolerance.  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  Avastin  held for the same.  - Encourage smoking cessation.  Patient is determined to quit by October 27. - Follows with gynecological surgeon at [facility] - If fistula is dilated at any point, can consider restarting Avastin   Chemotherapy-induced peripheral neuropathy Persistent neuropathy affecting right leg and three fingers of right hand, likely exacerbated by oxaliplatin .  - Discontinue oxaliplatin  due to neuropathy and disease progression at this time  Rectal pain -Continue oxycodone  10 mg once daily as needed.   The patient understands the plans discussed today and is in agreement with them.  She knows to contact our office if she develops concerns prior to her next appointment.  The total time spent in the appointment was 40 minutes for the encounter with patient, including review of chart and various tests results, discussions about plan of care and coordination of care plan  Mickiel Dry, MD  [facility] [place] - A DEPT OF [facility] [address] [place] [address] Dept: [phone] Dept Fax: [phone]   No orders of the defined types were placed in this encounter.    ONCOLOGY HISTORY:   I have reviewed her chart and materials related to her cancer extensively and collaborated history with the patient. 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 She also noted pain in the  tailbone region and right sided lower back pain, as well as 50 pound weight loss which was partially intentional. -03/08/2022: CT AP: There is irregular circumferential masslike wall thickening of the sigmoid colon/rectum with adjacent perirectal adenopathy. There is a subtle 2.9 cm area of low attenuation within the central aspect of the liver which may represent a hepatic mass. Additionally there are multiple subcentimeter low-attenuation lesions in the liver. -03/15/2022: CEA 13.4 -elevated -03/21/2022: Colonoscopy found: Fungating infiltrative nearly completely obstructing mass in the rectosigmoid colon, which was circumferential measuring 4 cm in length.  Pathology: Invasive moderately differentiated adenocarcinoma. Tumor arises within tubular adenoma. No angiolymphatic or perineural invasion is seen. As there is very scant invasive tumor within these fragments. -03/23/2022: Initial PET: Rectal primary with hypermetabolic regional nodal metastasis. Multiple foci of hepatic hypermetabolism, corresponding to tiny liver lesions on CT. Highly suspicious for early hepatic metastasis. There is evidence of bilobar disease with small lesions in the left hepatic lobe which are best visualized on diffusion imaging. Reference lesion measuring 6 mm series 15, image 38 and on series 4, image 62 located in segment IVa/b. Reference lesion measuring 5 mm on series 4, image 59 in segment II. -04/01/2022: MRI Abdomen: Multiple small hypovascular rim-enhancing liver lesions, predominantly in the right hepatic lobe measuring up to 1.1 cm, highly suspicious for liver metastases. -04/20/2022: Liver biopsy. Pathology: Metastatic moderately differentiated colonic adenocarcinoma with mucinous features.  -04/20/2022: NGS:  K-ras G12 R mutation, PIK3CA exon 21 mutation. MSI-stable. TMB-low.  -05/29/2022-11/15/2022: 13 cycles of FOLFIRI completed with bevacizumab  added on cycle 2 -08/24/2022: Germline mutation testing: Ambry  genetics:Negative -10/23/2022: MRI Pelvis: Known metastatic rectal adenocarcinoma with persistent rectal narrowing and signs of involvement of the posterior cervical stroma and anterior peritoneal reflection. Signal characteristics within the posterior cervix suggest residual viable tumor in this location. Persistent presacral edema is not substantially changed compared to previous imaging. A bandlike area of signal extending from the rectum posteriorly likely reflects previous tumor now displaying low T2 signal suggesting post treatment changes. Distance from tumor to the internal anal sphincter is 5.5 cm. -11/10/2022: MRI Liver: Three liver lesions are identified, one of which has imaging characteristics most compatible with focal nodular hyperplasia. The other 2 lesions are likely transient hepatic attenuation difference and/or FNH. The previous small rim enhancing lesions in the liver shown on prior MRI are not readily apparent on today's exam, presumably reflecting response to therapy. -12/11/2022-01/18/2023: Chemoradiation therapy with Xeloda   -04/04/2023: Robotic assisted LAR, diverting ileostomy, TAH with BSO  Pathology: Rectosigmoid colon positive for invasive adenocarcinoma, moderately differentiated, with partial treatment response. 1.5 cm in size. Tumor invades invades cervix through serosal adhesion. Perineural invasion identified. No lymphovascular invasion identified. Negative margins. Twelve lymph nodes, negative for metastatic carcinoma (0/12). Benign ovaries and fallopian tubes. ypT4b, ypN0, ypM1a. MMR preserved.  -05/2023-09/2023: 6 cycles of FOLFIRI, held briefly prior to ileostomy takedown -06/29/2023: CEA:31.3 -11/07/2023: Diverting loop ileostomy takedown -12/10/2023-03/11/2024: FOLFIRI + Bevacizumab , Bevacizumab  held after 01/22/2024 for rectovaginal fistula.  discontinued due to progression -12/30/2023: CT CAP: Fat plane between the posterior wall of the vagina and the low rectum  is obscured. There is no visible gas within the vagina although there does appear to be complex fluid in the vagina. This fluid extends in a curvilinear finger-like projection posteriorly at the midline towards the expected location  of the low rectum and could represent a rectal vaginal fistula. Progressive liver metastases. Cavitary nodule lateral right lung shows interval wall thickening and development of a posterior mural nodule. 3 mm nodule anterior left upper lobe previously is now 6 mm and qualitatively progressive in the interval. Features are concerning for progression of pulmonary metastases. Presacral soft tissue is similar to prior and likely treatment related. The chronic relatively thick-walled presacral cavity containing gas measures minimally smaller at 3.2 x 1.3 cm today compared to 3.6 x 2.0 cm previously and is compatible sequelae of previous surgery and subsequent reported anastomotic leak. -01/02/2024: Patient evaluated by Dr. Rogelio [gynecologic oncologist] at [facility] for rectovaginal fistula -01/22/2024: Colonoscopy: rectovaginal fistula, Bevacizumab  held due to fistula -03/18/2024: CT CAP: Slight progression of hepatic metastatic disease, measuring up to 5.0 x 5.7 cm in the left hepatic lobe, previously 4.9 x 5.1 cm. Stable pulmonary metastases. -03/25/2024-09/09/2024: FOLFOX.  Discontinued for progression. -06/09/2024: Patient evaluated by Dr. Barbaraann lia surgeon] at [facility] who recommended hepatic arterial pump and discussion with tumor board to give further recommendations. Patient was reluctant to undergo this procedure and instead wished to pursue Y90 treatments to her 2 metastatic hepatic lesions. -06/05/2024: CT liver: Hypoenhancing hepatic lesions, the largest within the left hepatic  lobe measuring up to 5.7 x 5.1 cm, slightly increased in size from 03/18/2024 when it measured up to 5.0 x 4.6 cm (5:84). Similarly a lesion within the central liver measures up to 1 cm,  previously up to 7 mm (5:91), right hepatic dome lesion measures up to 2.1 x 2.0 cm, previously 2.0 x 1.9  cm (5:72). Foci of arterial hyperenhancement likely perfusional/vascular etiology.  -06/05/2024: MRI liver: Stable to slightly increased size of multiple hypoenhancing hepatic  lesions as above, findings which are consistent with known hepatic metastasis.  -08/05/2024: CEA:63.0 -09/09/2024: CEA: 73.3 - 09/15/2024: CT CAP: Slight interval enlargement of hepatic metastasis. Interval increase in solid character of a cavitary nodule in the lateral segment right middle lobe measuring 1.2 x 0.9 cm, consistent with a worsened pulmonary metastasis.    Current Treatment: Lonsurf INTERVAL HISTORY:   Discussed the use of AI scribe software for clinical note transcription with the patient, who gave verbal consent to proceed.  History of Present Illness Terri Mckinney is a 43 year old female with metastatic rectal cancer who presents for follow-up.  She experiences significant peripheral neuropathy, particularly in her right leg and three fingers of her right hand, affecting her ability to walk and work. Her right leg feels like 'dead weight,' and she notes swelling and bruising in her pinky toe without known trauma. She has not taken gabapentin  due to previous side effects of diarrhea. She plans to quit smoking by October 27, her father's birthday.  Patient was previously seen in June and refused HAIP.  She is also seeing a Careers adviser at [facility] for a rectovaginal fistula.  We discussed the recent CT scan findings together and that there is clear progression in liver metastasis and lung metastasis.  With worsening of peripheral neuropathy, discussed changing treatment options to next line.   Her social history includes two adult children and a recent divorce last year following a diagnosis of cancer.    I have reviewed the past medical history, past surgical history, social history and family  history with the patient and they are unchanged from previous note.  ALLERGIES:  is allergic to wellbutrin  [bupropion ] and tramadol .  MEDICATIONS:  Current Outpatient Medications  Medication Sig Dispense Refill  loperamide  (IMODIUM ) 2 MG capsule Take 1 capsule (2 mg total) by mouth 4 (four) times daily as needed for diarrhea or loose stools. 12 capsule 0   Multiple Vitamin (MULTIVITAMIN) tablet Take 1 tablet by mouth daily.     nicotine (NICODERM CQ - DOSED IN MG/24 HOURS) 21 mg/24hr patch Place 1 patch onto the skin.     nystatin ointment (MYCOSTATIN) Apply topically daily.     prochlorperazine  (COMPAZINE ) 10 MG tablet Take 1 tablet (10 mg total) by mouth every 6 (six) hours as needed (Nausea or vomiting). 30 tablet 1   trifluridine-tipiracil (LONSURF) 20-8.19 MG tablet Take 3 tablets (60 mg of trifluridine total) by mouth 2 (two) times daily after a meal. Take within 1 hr after AM & PM meals on days 1-5, 8-12. Repeat every 28 days. 60 tablet 2   gabapentin  (NEURONTIN ) 300 MG capsule Take 1 capsule (300 mg total) by mouth at bedtime. (Patient not taking: Reported on 09/23/2024) 90 capsule 2   oxyCODONE  (ROXICODONE ) 5 MG immediate release tablet Take 1 tablet (5 mg total) by mouth every 6 (six) hours as needed for severe pain (pain score 7-10). 120 tablet 0   No current facility-administered medications for this visit.    REVIEW OF SYSTEMS:   Constitutional: Denies fevers, chills or abnormal weight loss Eyes: Denies blurriness of vision Ears, nose, mouth, throat, and face: Denies mucositis or sore throat Respiratory: Denies cough, dyspnea or wheezes Cardiovascular: Denies palpitation, chest discomfort or lower extremity swelling Gastrointestinal:  Denies nausea, heartburn or change in bowel habits Skin: Denies abnormal skin rashes Lymphatics: Denies new lymphadenopathy or easy bruising Neurological:Denies numbness, tingling or new weaknesses Behavioral/Psych: Mood is stable, no new  changes  All other systems were reviewed with the patient and are negative.   VITALS:  Last menstrual period 04/19/2022.  Wt Readings from Last 3 Encounters:  09/23/24 163 lb 6.4 oz (74.1 kg)  09/09/24 161 lb 9.6 oz (73.3 kg)  08/25/24 164 lb 11.2 oz (74.7 kg)    There is no height or weight on file to calculate BMI.  Performance status (ECOG): 1 - Symptomatic but completely ambulatory  PHYSICAL EXAM:   GENERAL:alert, no distress and comfortable SKIN: skin color, texture, turgor are normal, no rashes or significant lesions LYMPH:  no palpable lymphadenopathy in the cervical, axillary or inguinal LUNGS: clear to auscultation and percussion with normal breathing effort HEART: regular rate & rhythm and no murmurs and no lower extremity edema ABDOMEN:abdomen soft, non-tender and normal bowel sounds Musculoskeletal:no cyanosis of digits and no clubbing  NEURO: alert & oriented x 3 with fluent speech  LABORATORY DATA:  I have reviewed the data as listed  Lab Results  Component Value Date   WBC 4.7 09/23/2024   NEUTROABS 3.2 09/23/2024   HGB 11.4 (L) 09/23/2024   HCT 35.4 (L) 09/23/2024   MCV 94.9 09/23/2024   PLT 152 09/23/2024     Chemistry      Component Value Date/Time   NA 141 09/23/2024 0815   NA 144 03/03/2022 1618   K 3.7 09/23/2024 0815   CL 106 09/23/2024 0815   CO2 23 09/23/2024 0815   BUN 9 09/23/2024 0815   BUN 10 03/03/2022 1618   CREATININE 0.73 09/23/2024 0815      Component Value Date/Time   CALCIUM  9.0 09/23/2024 0815   ALKPHOS 103 09/23/2024 0815   AST 22 09/23/2024 0815   ALT 13 09/23/2024 0815   BILITOT 0.4 09/23/2024 0815   BILITOT <  0.2 03/03/2022 1618      Latest Reference Range & Units 09/09/24 09:51  CEA 0.0 - 4.7 ng/mL 73.3 (H)  (H): Data is abnormally high  RADIOGRAPHIC STUDIES: I have personally reviewed the radiological images as listed and agreed with the findings in the report.  CT CHEST ABDOMEN PELVIS W CONTRAST CLINICAL  DATA:  Metastatic rectal cancer, assess treatment response * Tracking Code: BO *  EXAM: CT CHEST, ABDOMEN, AND PELVIS WITH CONTRAST  TECHNIQUE: Multidetector CT imaging of the chest, abdomen and pelvis was performed following the standard protocol during bolus administration of intravenous contrast.  RADIATION DOSE REDUCTION: This exam was performed according to the departmental dose-optimization program which includes automated exposure control, adjustment of the mA and/or kV according to patient size and/or use of iterative reconstruction technique.  CONTRAST:  OMNIPAQUE  IOHEXOL  300 MG/ML SOLN additional oral enteric contrast  COMPARISON:  03/18/2024  FINDINGS: CT CHEST FINDINGS  Cardiovascular: Right chest port catheter. Normal heart size. No pericardial effusion.  Mediastinum/Nodes: No enlarged mediastinal, hilar, or axillary lymph nodes. Thyroid  gland, trachea, and esophagus demonstrate no significant findings.  Lungs/Pleura: Mild diffuse bilateral bronchial wall thickening. New bandlike consolidation and clustered centrilobular and tree-in-bud nodularity throughout the left lung base (series 3, image 116). Similar bandlike scarring of the medial segment right middle lobe (series 3, image 74). Interval increase in solid character of a cavitary nodule in the lateral segment right middle lobe measuring 1.2 x 0.9 cm (series 3, image 81). Unchanged small subpleural nodule of the anterior left upper lobe (series 3, image 55). No pleural effusion or pneumothorax.  Musculoskeletal: No chest wall abnormality. No acute osseous findings.  CT ABDOMEN PELVIS FINDINGS  Hepatobiliary: Slight interval enlargement of hepatic metastases, most notably a bulky metastasis of the posterior left lobe of the liver, measuring 6.6 x 5.5 cm, previously 5.7 x 5.0 cm (series 2, image 52) and an additional index lesion of the posterior right lobe of the liver measuring 1.9 x 1.5 cm,  previously 1.5 x 1.3 cm (series 2, image 59) no gallstones, gallbladder wall thickening, or biliary dilatation.  Pancreas: Unremarkable. No pancreatic ductal dilatation or surrounding inflammatory changes.  Spleen: Normal in size without significant abnormality.  Adrenals/Urinary Tract: Adrenal glands are unremarkable. Kidneys are normal, without renal calculi, solid lesion, or hydronephrosis. Bladder is unremarkable.  Stomach/Bowel: Stomach is within normal limits. Appendix appears normal. No evidence of bowel wall thickening, distention, or inflammatory changes. Unchanged postoperative/post treatment appearance of the rectum and low pelvis status post low anterior resection and reanastomosis (series 2, image 117). Unchanged small presacral air and fluid collection (series 2, image 113).  Vascular/Lymphatic: No significant vascular findings are present. No enlarged abdominal or pelvic lymph nodes.  Reproductive: Hysterectomy.  Other: No abdominal wall hernia or abnormality. No ascites.  Musculoskeletal: No acute osseous findings.  IMPRESSION: 1. Slight interval enlargement of hepatic metastases. 2. Interval increase in solid character of a cavitary nodule in the lateral segment right middle lobe measuring 1.2 x 0.9 cm, consistent with a worsened pulmonary metastasis. Unchanged small nodule of the anterior left upper lobe. 3. New bandlike consolidation and clustered centrilobular and tree-in-bud nodularity throughout the left lung base, consistent with atypical infection or aspiration. 4. Unchanged postoperative/post treatment appearance of the rectum and low pelvis status post low anterior resection and reanastomosis. Unchanged small presacral air and fluid collection.  Electronically Signed   By: Marolyn JONETTA Jaksch M.D.   On: 09/16/2024 13:54

## 2024-09-23 NOTE — Telephone Encounter (Signed)
 Oral Oncology Patient Advocate Encounter   New authorization   Received notification that prior authorization for Lonsurf is required.   PA submitted on CMM via Latent Key [id number] Status is pending     Terri Mckinney (Patty) Chet Burnet, CPhT  [facility], [facility] [place], [facility] - Oral Chemotherapy Patient Advocate Specialist III Phone: [phone]  Fax: [phone]

## 2024-09-23 NOTE — Progress Notes (Signed)
 Patients port flushed without difficulty.  Good blood return noted with no bruising or swelling noted at site. Patient remains accessed for treatment.

## 2024-09-23 NOTE — Telephone Encounter (Signed)
 Clinical Pharmacist Practitioner Encounter   Received new prescription for Lonsurf (trifluridine/tipiracil) for the treatment of progressive metastatic rectal cancer, planned duration until disease progression or unacceptable drug toxicity.  CMP from 09/23/24 assessed, no relevant lab abnormalities. Prescription dose and frequency assessed.   Current medication list in Epic reviewed, no DDIs with Lonsurf identified.  Evaluated chart and no patient barriers to medication adherence identified.   Prescription has been e-scribed to the [facility] for benefits analysis and approval.  Oral Oncology Clinic will continue to follow for insurance authorization, copayment issues, initial counseling and start date.   Arsalan Brisbin N. Irianna Gilday, PharmD, BCOP, CPP Hematology/Oncology Clinical Pharmacist [facility] Oral Chemotherapy Navigation Clinic [phone]  09/23/2024 2:51 PM

## 2024-09-23 NOTE — Patient Instructions (Addendum)
 [facility] at [facility] Discharge Instructions   You were seen and examined today by Dr. Davonna.  She reviewed the results of your lab work which are normal/stable.   She reviewed the results of your CT scan. It is showing the cancer is progressing. We will need change treatments. Dr. Davonna discussed you being evaluated by interventional radiology for radiation to the liver spots. She also discussed referring you to radiation oncology for radiation to the lung.   She discussed with you starting on a pill called Lonsurf. It is taken Monday-Friday for 2 weeks on and 2 weeks off.   We will refer you to [facility] with Dr Michiel to see if you are eligible for a clinical trial.   Return as scheduled.    Thank you for choosing [facility] at [facility] to provide your oncology and hematology care.  To afford each patient quality time with our provider, please arrive at least 15 minutes before your scheduled appointment time.   If you have a lab appointment with the Cancer Center please come in thru the Main Entrance and check in at the main information desk.  You need to re-schedule your appointment should you arrive 10 or more minutes late.  We strive to give you quality time with our providers, and arriving late affects you and other patients whose appointments are after yours.  Also, if you no show three or more times for appointments you may be dismissed from the clinic at the providers discretion.     Again, thank you for choosing [facility].  Our hope is that these requests will decrease the amount of time that you wait before being seen by our physicians.       _____________________________________________________________  Should you have questions after your visit to [facility], please contact our office at [phone] and follow the prompts.  Our office hours are 8:00 a.m. and 4:30 p.m. Monday - Friday.  Please note  that voicemails left after 4:00 p.m. may not be returned until the following business day.  We are closed weekends and major holidays.  You do have access to a nurse 24-7, just call the main number to the clinic [phone] and do not press any options, hold on the line and a nurse will answer the phone.    For prescription refill requests, have your pharmacy contact our office and allow 72 hours.    Due to Covid, you will need to wear a mask upon entering the hospital. If you do not have a mask, a mask will be given to you at the Main Entrance upon arrival. For doctor visits, patients may have 1 support person age 63 or older with them. For treatment visits, patients can not have anyone with them due to social distancing guidelines and our immunocompromised population.

## 2024-09-24 ENCOUNTER — Encounter: Payer: Self-pay | Admitting: *Deleted

## 2024-09-24 LAB — CEA: CEA: 117 ng/mL — ABNORMAL HIGH (ref 0.0–4.7)

## 2024-09-25 ENCOUNTER — Inpatient Hospital Stay

## 2024-09-25 NOTE — Telephone Encounter (Signed)
 Oral Oncology Patient Advocate Encounter  Additional information was required and faxed to insurance.  Insurance fax number: [phone]  [place] (Patty) Chet Burnet, CPhT  [facility], Zelda Salmon, [facility] - Oral Chemotherapy Patient Advocate Specialist III Phone: [phone]  Fax: [phone]

## 2024-09-26 ENCOUNTER — Other Ambulatory Visit (HOSPITAL_COMMUNITY): Payer: Self-pay

## 2024-09-26 NOTE — Telephone Encounter (Addendum)
 Oral Oncology Patient Advocate Encounter  Prior Authorization for Terri Mckinney has been approved.    PA# [id number] Effective dates: 09/26/2024 through 09/25/2025  [facility].  Supportive information has been e[facility]faxed to [facility].  Xochilth [place] (Patty) Chet Burnet, CPhT  [facility], Zelda Salmon, [facility] - Oral Chemotherapy Patient Advocate Specialist III Phone: [phone]  Fax: [phone]

## 2024-09-30 ENCOUNTER — Telehealth: Payer: Self-pay | Admitting: Radiation Oncology

## 2024-09-30 NOTE — Telephone Encounter (Signed)
 10/28 @ 11:07 am Left voicemail for patient to call our office to be sch for consult.

## 2024-09-30 NOTE — Telephone Encounter (Signed)
 10/28 Called and faxed via stat request for patient's treatment/Dosimetry records to be email from [facility].  Waiting on records.

## 2024-10-02 ENCOUNTER — Telehealth: Payer: Self-pay | Admitting: Radiation Oncology

## 2024-10-02 NOTE — Telephone Encounter (Signed)
 10/30 @ 11:20 am Left voicemail with patient and patient's daughter for patient to call our office to be sch for consult.

## 2024-10-03 ENCOUNTER — Telehealth: Payer: Self-pay | Admitting: Radiation Oncology

## 2024-10-03 NOTE — Progress Notes (Signed)
 New Patient Intake Documentation Encounter  Upcoming appointments: Future Appointments     Date/Time Provider Department Center Visit Type   10/08/2024 2:00 PM (Arrive by 1:45 PM) Michiel Beaulah Lenis, MD [facility] NEW PATIENT        JOSETTE SHIMABUKURO [id number] is a 43 y.o. female referred for evaluation of rectal cancer.  1. 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 Stools have become bloody/mucousy lately. - She also reported pain in the tailbone region since March 2023. She also has right-sided lower back pain. - 50 pound weight loss in the last 9 months, part of weight loss was intentional. She cut back on eating sweets and lost taste to sweets after COVID infection. - CT AP with contrast on 03/08/2022: Irregular circumferential masslike wall thickening of sigmoid colon/rectum with adjacent perirectal adenopathy. Multiple small hypodense lesions in the liver, largest 2.9 cm in the central aspect of the liver, question metastatic disease. - Colonoscopy on 03/21/2022 by Dr. Cindie: Fungating infiltrative nearly completely obstructing mass in the rectosigmoid colon, mass was circumferential measuring 4 cm in length. - Pathology: Rectal mass biopsy consistent with invasive moderately differentiated adenocarcinoma. As there is very scant invasive tumor, MSI studies were deferred. - PET scan (03/23/2022): Hypermetabolic rectal primary long-segment with SUV 14.3. Left posterior perirectal lymph node 7 mm with SUV 2.9. Multiple tiny foci of hepatic hypermetabolism. - MRI of the liver (04/01/2022): Multiple small hypovascular rim-enhancing liver lesions, predominantly in the right hepatic lobe measuring up to 1.1 cm. 3.3 cm hypervascular mass in the central liver, most consistent with FNH/hepatic adenoma. - Liver biopsy (04/20/2022): Metastatic moderately differentiated colonic adenocarcinoma with  mucinous features - NGS testing shows K-ras G12 R mutation, PIK3CA exon 21 mutation. MS-stable. TMB-low. - Cycle 1 of FOLFOXIRI on 05/29/2022, bevacizumab  added during cycle 2, cycle 13 on 11/15/2022 -MRI pelvis (10/23/2022): Known metastatic rectal cancer with persistent rectal narrowing and signs of involvement of posterior cervical stroma and anterior peritoneal reflection. There is evidence of residual tumor in the posterior cervix. Persistent presacral edema not substantially changed. - MRI liver (11/10/2022): 3 benign lesions and previous small rim-enhancing metastatic lesions in the liver are no longer seen. - Chemoradiation therapy with Xeloda  from 12/11/2022 through 01/18/2023. - 04/04/2023: Robotic assisted LAR, diverting ileostomy, TAH with BSO - Pathology: YpT4b, ypN0, YPM1, 0/12 lymph nodes involved, no tumor deposits identified, margins negative, LVI negative, PNI positive, 1.5 cm grade 2 adenocarcinoma in the rectosigmoid colon, no perforation, MMR preserved. - 11/07/2023: Diverting loop ileostomy takedown - CT CAP (12/30/2023): Left hepatic lobe lesion increased 5.2 x 5.1 cm, previously 3.5 x 3 cm. Posterior right lower lobe lesion measures 1.8 x 1.7, previously 1.6 x 1.4. Cavitary nodule in the right lung measures 13 x 10 mm, previously 1211 mm. 3 mm nodule in the left upper lobe is now 6 mm. This was compared to CT from 10/01/2023. - She had 2-month break from chemotherapy due to surgery. - FOLFIRI from 12/10/2023 through 03/11/2024 with progression. -03/18/24- CT CAP - 1. Slight progression of hepatic metastatic disease. 2. Stable pulmonary metastases. 3. Mild hepatomegaly.  -03/25/2024-09/09/2024: FOLFOX. Discontinued for progression. -06/09/2024: Patient evaluated by Dr. Barbaraann lia surgeon] at [facility] who recommended hepatic arterial pump and discussion with tumor board to give further recommendations. Patient was reluctant to undergo this procedure and instead wished to pursue Y90  treatments to her 2 metastatic hepatic lesions. -  06/05/2024: CT liver: Hypoenhancing hepatic lesions, the largest within the left hepatic lobe measuring up to 5.7 x 5.1 cm, slightly increased in size from 03/18/2024 when it measured up to 5.0 x 4.6 cm (5:84). Similarly a lesion within the central liver measures up to 1 cm, previously up to 7 mm (5:91), right hepatic dome lesion measures up to 2.1 x 2.0 cm, previously 2.0 x 1.9 cm (5:72). Foci of arterial hyperenhancement likely perfusional/vascular etiology.  -06/05/2024: MRI liver: Stable to slightly increased size of multiple hypoenhancing hepatic lesions as above, findings which are consistent with known hepatic metastasis.  -08/05/2024: CEA:63.0 -09/09/2024: CEA: 73.3 - 09/15/2024: CT CAP: Slight interval enlargement of hepatic metastasis. Interval increase in solid character of a cavitary nodule in the lateral segment right middle lobe measuring 1.2 x 0.9 cm, consistent with a worsened pulmonary metastasis.    Patient Active Problem List  Diagnosis  . Rectovaginal fistula  . Rectal adenocarcinoma (CMS/HHS-HCC)  . History of known metastasis to liver  . Tobacco use disorder, continuous    Molecular:   Pathology:  Surgical pathology Specimen: Tissue - Specimen from gastrointestinal tract (specimen) Component 2 yr ago  SURGICAL PATHOLOGY SURGICAL PATHOLOGY CASE: [id number] PATIENT: Evvie Mood Surgical Pathology Report     Clinical History: rectal mass     FINAL MICROSCOPIC DIAGNOSIS:  A. RECTUM, MASS, BIOPSY: Invasive moderately differentiated adenocarcinoma (see comment) Tumor arises within a tubular adenoma  COMMENT:  Sections show multiple fragments of colonic mucosa several of which are ulcerated with underlying desmoplastic and fibrotic stroma which is focally infiltrated by irregular angulated glands and solid nests of atypical cells with variably enlarged round to oval hyperchromatic nuclei.  No  angiolymphatic or perineural invasion is seen.  Most of the fragments show reactive colonic mucosa with features consistent with mucosal prolapse.  A single fragment exhibits a tubular proliferation of crypts lined by adenomatous epithelium.  As there is very scant invasive tumor within these fragments MSI studies will be deferred to the resection specimen unless otherwise requested.    urgical pathology Specimen: Tissue - Open core needle biopsy of liver (procedure) Component 2 yr ago  SURGICAL PATHOLOGY SURGICAL PATHOLOGY CASE: [id number] PATIENT: Caelan Berndt Surgical Pathology Report     Clinical History: right liver lesion, rectal cancer (cm)     FINAL MICROSCOPIC DIAGNOSIS:  A. LIVER, RIGHT, NEEDLE CORE BIOPSY: Metastatic moderately differentiated colonic adenocarcinoma with mucinous features   GROSS DESCRIPTION:  Received in formalin are 4 cores of white-tan soft tissue ranging from 0.5 to 0.8 cm in length, each measuring 0.1 cm in diameter.  The specimen is entirely submitted in 2 blocks. (KW, 04/20/2022)     Surgical pathology Specimen: Tissue - Specimen from gastrointestinal tract (specimen), Tissue specimen (specimen) - Specimen f... Component 1 yr ago  SURGICAL PATHOLOGY SURGICAL PATHOLOGY * THIS IS AN ADDENDUM REPORT * CASE: [id number] PATIENT: ROSALINE GLANCE Surgical Pathology Report *Addendum *  Reason for Addendum #1:  DNA Mismatch Repair IHC Results  Clinical History: RECTAL CANCER     FINAL MICROSCOPIC DIAGNOSIS:  A. RECTOSIGMOID COLON, UTERUS, CERVIX, BILATERAL FALLOPIAN TUBES AND OVARIES RESECT      Rectosigmoid colon: Invasive adenocarcinoma, moderately differentiated, with treatment effect.      Tumor size: 1.5 cm in maximal dimension.      Tumor invades cervix through the serosal adhesion.      No lymphovascular invasion identified.      Perineural invasion identified.      Surgical margins of resection are  negative  for carcinoma.           Closest margin: 0.5 cm to mesorectal margin.      Twelve lymph nodes, negative for metastatic carcinoma (0/12).      Treatment response: Partial response, Score 2.      See oncology table.  Mesorectum complete. One separated hyperplastic polyp.      Enterocutaneous tissue consistent with ostomy site changes.      Pigmented tattoo site identified.      Findings in other organs:      Cervix: Involved by adenocarcinoma.           Negative for dysplasia. Endometrium:           Benign inactive endometrium.           Negative for hyperplasia or malignancy.      Myometrium:           Unremarkable.           Negative for malignancy. Serosa:           Unremarkable.           Negative for malignancy. Right ovary:           Benign Brenner tumor (4 mm).           Negative for malignancy.      Left ovary:           Unremarkable.           Negative for malignancy.      Bilateral fallopian tubes:           Benign fimbriated fallopian tubes.           Negative for malignancy.  B. ANASTOMOSIS RING, EXCISION:      Segment of colon without significant diagnostic alteration.      Negative for dysplasia or malignancy.  ONCOLOGY TABLE:    COLON AND RECTUM, CARCINOMA:  Resection, Including Transanal Disk Excision of Rectal Neoplasms  Procedure: En bloc resection Tumor Site: Rectosigmoid colon Tumor Size: 1.5 cm in maximal dimension Macroscopic Tumor Perforation: Not identified Macroscopic Evaluation of Mesorectum (required for rectal cancer): Complete Histologic Type: Invasive adenocarcinoma Histologic Grade: Grade 2, moderately differentiated Multiple Primary Sites: Not applicable Tumor Extension: Directly extends to cervix through serosal adhesion Lymphovascular Invasion: Not identified Perineural Invasion: Perineural invasive Treatment Effect: Partial response, Score 2. Margins:      Margin Status for Invasive Carcinoma: All margins negative  for invasive carcinoma      Distance from Invasive Carcinoma to Radial (Circumferential) Margin (required for rectal           tumors): 0.5 cm      Distance from Invasive Carcinoma to Closest Mucosal Margin (relevant and required only for           transanal disc excisions): Not applicable (not a transanal disc excision)      Margin Status for Non-Invasive Tumor: All margins negative for high-grade dysplasia / intramucosal           carcinoma and low-grade dysplasia Regional Lymph Nodes:      Number of Lymph Nodes with Tumor: 0      Number of Lymph Nodes Examined: 12 Tumor Deposits: Not identified Distant Metastasis:      Distant Site(s) Involved: Liver Pathologic Stage Classification (pTNM, AJCC 8th Edition): ypT4b, ypN0, ypM1a. Ancillary Studies: MMR / MSI testing will be ordered. Representative Tumor Block: A9 Comments: None (v4.2.0.1)   Imaging:  MRI liver with and without contrast  Procedure:  MRI Abdomen with and without contrast  Indication: Colon cancer, stage IV, monitor, mCRCa with liver mets; pre surg eval; EOVIST protocol, C20 Malignant neoplasm of rectum (CMS/HHS-HCC), C20 Malignant neoplasm of rectum (CMS/HHS-HCC), C78.7 Secondary malignant neoplasm of liver and intrahepatic bile duct (CMS/HHS-HCC)  Comparison:  CT chest abdomen and pelvis 03/18/2024  Technique: Precontrast and dynamic postcontrast MR imaging of the abdomen was performed using the Liver Protocol. IV contrast was administered to improve disease detection and further define anatomy.  Findings:  - Lower Thorax: No suspicious pulmonary abnormalities. No pleural or pericardial effusions.  - Liver: Normal in size with smooth hepatic contour. Hypoenhancing lesions within the left and right hepatic lobes consistent with known hepatic metastasis, largest lesion within hepatic segment II measuring 6.3 x 4.9 cm (series 27 #16) . Secondary metastatic lesion in hepatic segment VII measuring 1.5 x 1.6  cm indices series 27 #7). Arterially enhancing hepatic lesion within hepatic segment for a measuring 2.7 x 2.1 cm, demonstrating return to baseline parenchymal enhancement on portal venous and delayed phase imaging, most consistent with a region of focal nodular hyperplasia (series 11 #21). Additional smaller lesion of focal nodular hyperplasia and hepatic segment IVb (Series 11 #24). Conventional hepatic arterial anatomy The portal and hepatic veins are patent.   - Biliary and Gallbladder: No intrahepatic or extrahepatic bile duct dilatation. The gallbladder is normal in appearance.  - Spleen: Normal in appearance.    - Pancreas: Normal in appearance.   - Adrenal Glands: Normal in appearance.   - Kidneys: Symmetric in size and enhancement. No suspicious renal lesions. No hydronephrosis.  - Abdominal Vasculature: No abdominal aortic aneurysm.  - Gastrointestinal Tract: No abnormal dilation or wall thickening in the field of view.  - Peritoneum/Mesentery/Retroperitoneum: No free fluid in the field of view.  - Lymph Nodes: No retroperitoneal or mesenteric lymphadenopathy.    - Body Wall: Unremarkable.  - Musculoskeletal:  No aggressive appearing osseous lesions.  Impression: 1.  Stable to slightly increased size of multiple hypoenhancing hepatic lesions as above, findings which are consistent with known hepatic metastasis. 2.  Arterially enhancing hepatic lesions within hepatic segment IV, most consistent with focal nodular hyperplasia.  Electronically Reviewed by:  Toribio Childs, MD, [facility] Electronically Reviewed on:  06/05/2024 4:04 PM  I have reviewed the images and concur with the above findings.  Electronically Signed by:  Lauraine Ned, MD, [facility] Electronically Signed on:  06/05/2024 8:54 PM CT cirrhosis protocol inc chest w MIPS and abd pel w Procedure: CT Chest with IV Contrast  Procedure: CT Abdomen and Pelvis with IV Contrast  Comparison:  CT  03/18/2024  Indication:  Colon cancer, stage IV, monitor, mCRCa with liver mets; presurgical, C20 Malignant neoplasm of rectum (CMS/HHS-HCC), C20 Malignant neoplasm of rectum (CMS/HHS-HCC), C78.7 Secondary malignant neoplasm of liver and intrahepatic bile duct (CMS/HHS-HCC)  Technique:  A cirrhosis protocol CT was performed of the chest, abdomen, and pelvis following the administration of intravenous contrast. Imaging of the abdomen was performed in the arterial phase, the portal venous phase, and in a delayed phase (approximately 3 minutes post contrast) through the abdomen, and in a portal venous phase through the chest and pelvis. Iodinated contrast was used due to the indications for the examination, to improve disease detection and to further define anatomy. Coronal and sagittal reformatted images were generated and reviewed. 3-D maximum intensity projection (MIP) reconstructions were performed of the chest to potentially increase study sensitivity.   Findings:  Chest:  No  supraclavicular, axillary, or mediastinal lymphadenopathy. No pericardial or pleural effusion. Patent central airways. Nodular cavitation within the posterior right upper lobe measuring up to 1 cm, similar to prior, likely treated metastasis (5:48). No enlarging pre-existing solid nodules. Right chest wall central venous catheter tip terminates in the SVC with trace amount of nonocclusive thrombus/fibrin sheath adjacent to the tip (5:33).  Abdomen and pelvis:   - Liver: Hypoenhancing hepatic lesions, the largest within the left hepatic lobe measuring up to 5.7 x 5.1 cm, slightly increased in size from 03/18/2024 when it measured up to 5.0 x 4.6 cm (5:84). Similarly a lesion within the central liver measures up to 1 cm, previously up to 7 mm (5:91), right hepatic dome lesion measures up to 2.1 x 2.0 cm, previously 2.0 x 1.9 cm (5:72). Foci of arterial hyperenhancement likely perfusional/vascular etiology.  -  Biliary: Decompressed gallbladder. No biliary duct dilation.  - Pancreas: Normal in appearance.   - Adrenal Glands: Normal in appearance.   - Kidneys: Symmetric size and enhancement the kidneys. No suspicious renal lesions. No hydronephrosis.  - Abdominal and Pelvic Vasculature: No abdominal aortic aneurysm. Accessory left hepatic artery supplying the lateral segment arising from the left gastric artery.  - Gastrointestinal Tract: Postsurgical changes of the rectosigmoid colon with adjacent presacral soft tissue, likely posttreatment related. Normal appendix.  - Peritoneum/Mesentery/Retroperitoneum: No free fluid.  No free intraperitoneal air.  - Lymph Nodes: No retroperitoneal or mesenteric lymphadenopathy.    - Bladder: Normal in appearance.  - Pelvic Organs: Unremarkable.  - Body Wall: Postsurgical changes in the left lower quadrant related to prior ostomy.  - Musculoskeletal:  No aggressive appearing osseous lesions.  Impression: 1.  Slightly increasing multifocal hepatic metastatic disease. 2.  Stable treated pulmonary metastasis. 3.  No new sites of metastatic disease. 4.  Hepatic artery infusion pump considerations: Accessory left hepatic artery arising from the left gastric artery supplying the left lateral segment of the liver.   Electronically Reviewed by:  Gatha Daria Herb, MD, [facility] Electronically Reviewed on:  06/05/2024 4:11 PM  I have reviewed the images and concur with the above findings.  Electronically Signed by:  Dayton Letters, MD, [facility] Electronically Signed on:  06/05/2024 4:14 PM

## 2024-10-03 NOTE — Telephone Encounter (Addendum)
 10/31 @ 9:15 am Left voicemail for patient to call our office to be sch for consult. No response from patient sent out unable to contact letter to patient's residence.

## 2024-10-07 ENCOUNTER — Inpatient Hospital Stay

## 2024-10-07 ENCOUNTER — Inpatient Hospital Stay: Admitting: Oncology

## 2024-10-07 NOTE — Telephone Encounter (Signed)
 After several attempts I have been unable to reach patient to provide [place] education.  [facility] been [facility] to reach patient to set up medication shipment.  Patient has also not read recent MyChart message from patient advocate instructing her to call specialty pharmacy.  Today 10/07/2024 reached out to Joesph Bohr, RN to let her know we had not been able to reach patient.  Joesph attempted call to patient and was also unable to reach her.  We are unable to proceed with getting Lonsurf to patient if she does not speak with specialty pharmacy to establish delivery.

## 2024-10-09 ENCOUNTER — Telehealth: Payer: Self-pay | Admitting: Radiation Oncology

## 2024-10-09 ENCOUNTER — Inpatient Hospital Stay

## 2024-10-09 NOTE — Telephone Encounter (Signed)
 11/6 Called Dr. Armanda office spoke to Amy - Ref Coord.  No response from patient.  Closing out referral at this time.

## 2024-10-13 NOTE — Telephone Encounter (Signed)
 It appears patient has not proceeding with Lonsurf at this time.  Per care everywhere, she is being seen at [facility] with the plan to proceed with HAI therapy.  Oral chemo signing off.

## 2024-10-14 ENCOUNTER — Inpatient Hospital Stay

## 2024-10-14 ENCOUNTER — Inpatient Hospital Stay: Admitting: Oncology

## 2024-10-24 NOTE — Progress Notes (Signed)
 Pt admitted this stay for rectal adenocarcinoma and HIAP pump placement . Presenting problems addressed and treated. Pt educated on home meds, activity level, and follow up appointments. All questions and concerns answered accordingly. Pt PIV removed, with catheter tip intact. Pt belongings gathered, Daughter to escort home, transport ordered to take pt to lobby.

## 2024-11-04 ENCOUNTER — Other Ambulatory Visit

## 2024-11-05 ENCOUNTER — Telehealth: Payer: Self-pay

## 2024-11-05 ENCOUNTER — Inpatient Hospital Stay

## 2024-11-05 ENCOUNTER — Inpatient Hospital Stay: Admitting: Oncology

## 2024-11-05 NOTE — Telephone Encounter (Signed)
 Patient called and cancel follow-up with [facility] states that she will transfer all care to [facility].

## 2024-12-01 ENCOUNTER — Encounter: Payer: Self-pay | Admitting: *Deleted
